# Patient Record
Sex: Female | Born: 1945 | Race: White | Hispanic: No | Marital: Married | Smoking: Former smoker
Health system: Southern US, Community
[De-identification: ages and names within clinical notes are randomized; demographics above are authoritative.]

## PROBLEM LIST (undated history)

## (undated) DIAGNOSIS — E785 Hyperlipidemia, unspecified: Secondary | ICD-10-CM

## (undated) DIAGNOSIS — F419 Anxiety disorder, unspecified: Secondary | ICD-10-CM

## (undated) DIAGNOSIS — E119 Type 2 diabetes mellitus without complications: Secondary | ICD-10-CM

## (undated) DIAGNOSIS — J449 Chronic obstructive pulmonary disease, unspecified: Secondary | ICD-10-CM

## (undated) DIAGNOSIS — I10 Essential (primary) hypertension: Secondary | ICD-10-CM

## (undated) DIAGNOSIS — I639 Cerebral infarction, unspecified: Secondary | ICD-10-CM

## (undated) DIAGNOSIS — K649 Unspecified hemorrhoids: Secondary | ICD-10-CM

## (undated) HISTORY — DX: Unspecified hemorrhoids: K64.9

## (undated) HISTORY — DX: Cerebral infarction, unspecified: I63.9

## (undated) HISTORY — DX: Chronic obstructive pulmonary disease, unspecified: J44.9

## (undated) HISTORY — DX: Type 2 diabetes mellitus without complications: E11.9

## (undated) HISTORY — PX: CATARACT EXTRACTION: SUR2

---

## 2002-03-30 HISTORY — PX: COLONOSCOPY: SHX174

## 2006-03-30 DIAGNOSIS — I639 Cerebral infarction, unspecified: Secondary | ICD-10-CM

## 2006-03-30 HISTORY — DX: Cerebral infarction, unspecified: I63.9

## 2006-10-13 ENCOUNTER — Emergency Department (HOSPITAL_COMMUNITY): Admission: EM | Admit: 2006-10-13 | Discharge: 2006-10-13 | Payer: Self-pay | Admitting: Emergency Medicine

## 2006-10-15 ENCOUNTER — Ambulatory Visit: Payer: Self-pay | Admitting: Internal Medicine

## 2006-10-15 ENCOUNTER — Inpatient Hospital Stay (HOSPITAL_COMMUNITY): Admission: EM | Admit: 2006-10-15 | Discharge: 2006-10-23 | Payer: Self-pay | Admitting: Emergency Medicine

## 2006-10-18 ENCOUNTER — Encounter (INDEPENDENT_AMBULATORY_CARE_PROVIDER_SITE_OTHER): Payer: Self-pay | Admitting: Emergency Medicine

## 2006-10-18 ENCOUNTER — Ambulatory Visit: Payer: Self-pay | Admitting: Physical Medicine & Rehabilitation

## 2006-10-18 ENCOUNTER — Ambulatory Visit: Payer: Self-pay | Admitting: *Deleted

## 2006-10-20 ENCOUNTER — Encounter (INDEPENDENT_AMBULATORY_CARE_PROVIDER_SITE_OTHER): Payer: Self-pay | Admitting: Emergency Medicine

## 2006-10-22 ENCOUNTER — Encounter (INDEPENDENT_AMBULATORY_CARE_PROVIDER_SITE_OTHER): Payer: Self-pay | Admitting: Internal Medicine

## 2006-10-23 ENCOUNTER — Ambulatory Visit: Payer: Self-pay | Admitting: Physical Medicine & Rehabilitation

## 2006-10-23 ENCOUNTER — Inpatient Hospital Stay (HOSPITAL_COMMUNITY)
Admission: RE | Admit: 2006-10-23 | Discharge: 2006-11-11 | Payer: Self-pay | Admitting: Physical Medicine & Rehabilitation

## 2006-11-16 ENCOUNTER — Encounter: Payer: Self-pay | Admitting: Physical Medicine & Rehabilitation

## 2006-11-29 ENCOUNTER — Encounter: Payer: Self-pay | Admitting: Physical Medicine & Rehabilitation

## 2006-12-29 ENCOUNTER — Encounter: Payer: Self-pay | Admitting: Physical Medicine & Rehabilitation

## 2006-12-31 ENCOUNTER — Ambulatory Visit: Payer: Self-pay | Admitting: Physical Medicine & Rehabilitation

## 2006-12-31 ENCOUNTER — Encounter
Admission: RE | Admit: 2006-12-31 | Discharge: 2007-01-03 | Payer: Self-pay | Admitting: Physical Medicine & Rehabilitation

## 2007-01-29 ENCOUNTER — Encounter: Payer: Self-pay | Admitting: Physical Medicine & Rehabilitation

## 2007-02-28 ENCOUNTER — Encounter: Payer: Self-pay | Admitting: Physical Medicine & Rehabilitation

## 2007-03-18 ENCOUNTER — Ambulatory Visit: Payer: Self-pay | Admitting: Physical Medicine & Rehabilitation

## 2007-03-18 ENCOUNTER — Encounter
Admission: RE | Admit: 2007-03-18 | Discharge: 2007-03-21 | Payer: Self-pay | Admitting: Physical Medicine & Rehabilitation

## 2007-03-31 ENCOUNTER — Encounter: Payer: Self-pay | Admitting: Physical Medicine & Rehabilitation

## 2007-05-01 ENCOUNTER — Encounter: Payer: Self-pay | Admitting: Physical Medicine & Rehabilitation

## 2007-07-12 ENCOUNTER — Encounter
Admission: RE | Admit: 2007-07-12 | Discharge: 2007-07-15 | Payer: Self-pay | Admitting: Physical Medicine & Rehabilitation

## 2007-07-15 ENCOUNTER — Ambulatory Visit: Payer: Self-pay | Admitting: Physical Medicine & Rehabilitation

## 2007-10-25 ENCOUNTER — Ambulatory Visit: Payer: Self-pay | Admitting: Unknown Physician Specialty

## 2007-11-02 ENCOUNTER — Encounter
Admission: RE | Admit: 2007-11-02 | Discharge: 2007-11-04 | Payer: Self-pay | Admitting: Physical Medicine & Rehabilitation

## 2007-11-04 ENCOUNTER — Ambulatory Visit: Payer: Self-pay | Admitting: Physical Medicine & Rehabilitation

## 2009-05-01 ENCOUNTER — Encounter: Payer: Self-pay | Admitting: Family Medicine

## 2009-05-08 ENCOUNTER — Ambulatory Visit: Payer: Self-pay | Admitting: Family Medicine

## 2009-05-28 ENCOUNTER — Encounter: Payer: Self-pay | Admitting: Family Medicine

## 2010-05-12 ENCOUNTER — Ambulatory Visit: Payer: Self-pay | Admitting: Ophthalmology

## 2010-05-26 ENCOUNTER — Ambulatory Visit: Payer: Self-pay | Admitting: Ophthalmology

## 2010-06-13 ENCOUNTER — Ambulatory Visit: Payer: Self-pay | Admitting: Ophthalmology

## 2010-06-16 ENCOUNTER — Ambulatory Visit: Payer: Self-pay | Admitting: Ophthalmology

## 2010-08-12 NOTE — Assessment & Plan Note (Signed)
Molly [name] returns to the clinic today for followup evaluation  accompanied by her husband.  The patient is a 65 year old Caucasian  female with a history of hypertension and noninsulin dependent diabetes  mellitus.  She was admitted October 15, 2006, with decreased balance and  vertigo.  MRI study showed a right posterior cerebral artery infarction.  MRA study was negative for significant intracranial stenosis.  Carotid  Dopplers showed right internal carotid artery stenosis of 40 to 60%.  Transesophageal echocardiogram showed an ejection fraction of 65% with a  small patent foramen ovale.  Neurology was consulted and recommended  Aggrenox therapy.  The patient was moved to the rehabilitation unit October 23, 2006, remained there through discharge November 11, 2006.   Since discharge the patient has followed up with Dr. Thana Ates with no  changes made.  She has also followed up with Dr. Pearlean Brownie and has a bubble  study planned for the next week or two to evaluate the patent foramen  ovale.  She continues to receive outpatient therapies at [facility] including physical and occupational therapy two times per week.  She has lost approximately 25 pounds and reports that her blood sugar  has been in the 70 to 100 range.  She does need refills on numerous  medicines in the office today.   In terms of her ambulation, she is walking with a single point cane and  occasionally without any assistive device whatsoever.  She does need  some help with bathing and dressing secondary to persistent weakness of  her left upper extremity.   MEDICATIONS:  1. Aggrenox one capsule b.i.d.  2. Gemfibrozil 600 mg b.i.d.  3. Avandia 8 mg daily.  4. Metformin 500 mg one tablet daily.  5. Avapro 150 mg daily.  6. Ocuvite one tablet daily.  7. Glyburide 5 mg b.i.d.   REVIEW OF SYSTEMS:  Noncontributory.   PHYSICAL EXAM:  A well-appearing, slightly to moderately overweight  adult female seated in a manual  wheelchair.  Blood pressure is 100/49  with a pulse of 90, respiratory rate 18 and O2 saturation 93% on room  air.  Right upper extremity and right lower extremity strength were 5/5  throughout.  Left upper extremity strength showed shoulder abduction and  flexion to 3-/5 with elbow flexion at 3-/5 and grip strength at 2+/5.  Left lower extremity hip flexion was 3-/5 with knee extension of 4+/5.  Ankle dorsiflexion was 2/5.   IMPRESSION:  1. 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  2. Noninsulin dependent diabetes mellitus.  3. Hypertension.  4. Dyslipidemia.   In the office today we did refill the patient's gemfibrozil along with  her Ocuvite, Glucophage, Avapro, Aggrenox, glyburide and Avandia  medications.  She will continue on outpatient therapies at this point.  We will plan on seeing her in followup in approximately 3 months' time.  She still desires to retain full use of her left upper extremity,  including finger motion, such that she could return to playing an  instrument such as the piano.  She has minimal movement in her left  distal upper extremity at the present time but still is hopeful for full  recovery.  The left lower  extremity has progressed well at this point and I expect that will  continue to do well for her.  Will plan on seeing her in followup as  noted above.  ______________________________  Ellwood Dense, M.D.     DC/MedQ  D:  01/03/2007 12:19:41  T:  01/03/2007 15:26:11  Job #:  [id number]

## 2010-08-12 NOTE — Discharge Summary (Signed)
NAME:  Molly [name], Molly [name] NO.:  [id number]   MEDICAL RECORD NO.:  [id number]          PATIENT TYPE:  INP   LOCATION:  3737                         FACILITY:  [facility]   PHYSICIAN:  Lonia Blood, M.D.       DATE OF BIRTH:  15-Mar-1946   DATE OF ADMISSION:  10/15/2006  DATE OF DISCHARGE:                               DISCHARGE SUMMARY   DISCHARGE DIAGNOSES:  1. 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  2. Left upper extremity hemiparesis related to the above stroke.  3. Hypertension.  4. Uncontrolled diabetes mellitus, type 2.  5. Morbid obesity.  6. Hypertriglyceridemia.  7. Status post tonsillectomy.   MEDICATIONS:  Medications at disposition will be dictated at the time of  the actual discharge of the patient.   PROCEDURES DURING THIS ADMISSION:  1. On October 15, 2006, the patient underwent MRI of the brain, findings      of acute stroke in the right medial temporal lobe and basal      ganglia, chronic small-vessel changes in the brainstem throughout      the hemispheric white matter, negative intracranial MR angiography.  2. On October 18, 2006, carotid ultrasound, findings of 40% IC stenosis      per flow.  3. MRA of the neck, no carotid stenosis.  4. On October 18, 2006, repeat MRI of the brain; findings of ongoing      acute infarct in the right medial temporal lobe. No other infarcts      seen.   CONSULTATIONS DURING THIS ADMISSION:  The patient was seen in  consultation by Dr. Pearlean Brownie from neurology, as well as Dr. Riley Kill from  physical medicine/rehabilitation.   HISTORY AND PHYSICAL:  For history and physical from admission, please  refer to the H and P done by Dr. Lavera Guise on October 15, 2006.   HOSPITAL COURSE:  1. Stroke. The patient was admitted with an acute right temporal lobe      and basal ganglia stroke. Her initial deficits were minimal. The      patient had only ataxia and visual field deficit. Unfortunately, on      a day-by-day basis  the patient's neurological deficits worsened. At      the time of my dictation, the patient has a significant right upper      extremity hemiparesis and visual cut deficit. She also has      worsening ataxia. All these deficits are fully explained by the      natural evolution of the patient's  initial stroke. Repeat studies      of the brain confirmed this supposition. The patient was treated      with aspirin and Plavix and a neurological consultation was      obtained. Dr. Pearlean Brownie from neurology suggested intravenous heparin      and a transesophageal echocardiogram per cardiology. The procedure      is planned for October 20, 2006.   1. Uncontrolled diabetes mellitus, type 2. The patient's measured      hemoglobin A1c was 8.9. The patient was  titrated up on metformin      and started on Lantus. She also had a sliding-scale insulin added.      We have achieved better control for the patient's  diabetes by      adjusting the Lantus on a daily basis. Extensive education has been      provided to the patient.   1. Hyperlipidemia. The patient's total cholesterol is 152 and the      triglyceride level is 456. The patient's  hypertriglyceridemia is      definitely related to her uncontrolled diabetes, obesity and      metabolic syndrome. The patient was started on gemfibrozil and she      will have her diabetes controlled.      Lonia Blood, M.D.  Electronically Signed     SL/MEDQ  D:  10/19/2006  T:  10/19/2006  Job:  [id number]

## 2010-08-12 NOTE — Consult Note (Signed)
NAME:  Molly [name], Molly [name]               ACCOUNT NO.:  [id number]   MEDICAL RECORD NO.:  [id number]          PATIENT TYPE:  INP   LOCATION:  3737                         FACILITY:  [facility]   PHYSICIAN:  Pramod P. Pearlean Brownie, MD    DATE OF BIRTH:  Sep 10, 1945   DATE OF CONSULTATION:  10/18/2006  DATE OF DISCHARGE:                                 CONSULTATION   REFERRING PHYSICIAN:  Lonia Blood, M.D.   REASON FOR REFERRAL:  Stroke.   HISTORY OF PRESENT ILLNESS:  Molly [name] is a 65 year old Caucasian lady  who was admitted 3 years ago with symptoms of dizziness and has gotten  worse and today developed left-sided weakness.  The patient actually  states the symptoms began 5 days ago when she has sudden onset episode  in which she passed out and fell down. She did state she did not lose  consciousness.  She was able to get up, and she rested for a little  while and felt all right.  The next day she actually went back to work  and was okay, but since Friday she has developed some vision  difficulties, trouble seeing on the left side, double vision, dizziness,  lack of balance.  She was admitted for evaluation on July 18 but this  morning was noted as having left-sided weakness.  The patient denies any  headache.  There is no prior known history of stroke, TIA, seizures or  significant neurological problems.   PAST MEDICAL HISTORY:  Significant for:  1. Diabetes which is very poorly controlled for the last 5 years.  2. Hypertension.  3. Obesity.   MEDICATIONS:  Aspirin. Plavix has been added during this admission.  Lovenox, Lopid, insulin, metformin, Avapro.   ALLERGIES TO MEDICATIONS:  None known.   FAMILY HISTORY:  None with stroke.   SOCIAL HISTORY:  The patient works as a Research scientist (medical) at [employer]. She does not smoke, drink.  She is married, lives with her  husband.   REVIEW OF SYSTEMS:  Negative for fever, chest pain, shortness of breath,  cough, diarrhea, nausea,  vomiting.   PHYSICAL EXAMINATION:  GENERAL:  Reveals obese, middle-age, Caucasian  lady who is not in distress.  VITAL SIGNS:  She is afebrile, temperature 98.7, pulse rate 102 per  minute and regular sinus, respiratory rate 18 per minute, blood pressure  132/84, saturation 97% on room air.  HEENT:  Head is nontraumatic.  NECK:  Supple without bruit.  CARDIAC:  Regular heart sounds.  LUNGS:  Clear to auscultation.  NEUROLOGIC:  The patient is awake, alert, oriented to time, place and  person.  There is no aphasia.  She has mild dysarthria.  Eye movements  are full range, but there is saccadic dysmetria to the left.  There is  mild left gaze paresis.  She has dense left homonymous hemianopsia.  There is no facial asymmetry.  Palatal movements normal.  Tongue is  midline.  Motor system exam reveals left hemiparesis with 2/5 strength  proximally at the shoulder and elbow.  She has good grip, has weakness  of  fine finger movements on the left.  Left lower extremity strength is  3/5 proximally.  There is a left foot drop with 0/5 strength in the  ankle dorsiflexors.  There is no subjective sensory loss in the left.  There is no sensory inattention to the left.  Coordination is impaired  on the left.   DATA REVIEWED:  MRI scan of the brain shows acute infarct involving the  medial aspect of the right temporal lobe extending up to the lateral  part of the thalamus.  Does not seem to involve occipital lobe.   MRA of the brain shows no significant occlusion of the large to medium-  size intracranial vessels.   Carotid ultrasound shows antegrade flow in both vertebral arteries and  no significant carotid stenosis.   Hemoglobin A1c is elevated at 8.9.  Homocysteine is normal at 7.1.  Triglycerides are significantly elevated at 456. HDL is low at 26, total  cholesterol of 152.   IMPRESSION:  A 65-year lady with right posterior cerebral artery  territory infarct with slight recent  worsening. I suspect additional  brain stem infarct as well.  The etiology is likely arterial thrombosis,  though proximal vertebral artery source or cardiac source still need to  be ruled out.   PLAN:  1. I would recommend further evaluation with repeating limited      diffusion-weighted MRI of the brain and taking addition sagittal      and coronal sections as well.  2. Check MRA of the neck with and without contrast to rule out high-      grade possible stenosis.  3. IV heparin drip until the patient stabilizes is above stroke workup      is completed.  4. Would consider switching the aspirin and Plavix to Aggrenox later.  5. Physical therapy consult, rehab consults.   I had long discussion with the patient and answered questions.  Thank  you for the referral.  I will follow the patient.           ______________________________  Sunny Schlein. Pearlean Brownie, MD     PPS/MEDQ  D:  10/18/2006  T:  10/18/2006  Job:  [id number]   cc:   Lonia Blood, M.D.

## 2010-08-12 NOTE — H&P (Signed)
NAME:  DESHARA, ROSSI NO.:  [id number]   MEDICAL RECORD NO.:  [id number]          PATIENT TYPE:  INP   LOCATION:  3733                         FACILITY:  [facility]   PHYSICIAN:  Lonia Blood, M.D.       DATE OF BIRTH:  [birth year]   DATE OF ADMISSION:  10/15/2006  DATE OF DISCHARGE:                              HISTORY & PHYSICAL   The patient's primary care physician is Dr. Charlette Caffey  in [place],  [place], which makes the patient unassigned for [facility].   CHIEF COMPLAINT:  Balance problems.   HISTORY OF PRESENT ILLNESS:  Ms. Holstrom is a 65 year old woman with  diabetes mellitus, hypertension, and a family history of strokes, who  relates an episode on October 13, 2006, when being at work all of a sudden  tried to get up, fell to the ground and passed out.  She was taken to  the emergency room and evaluated fully and felt to be safe for  discharge.  The patient went home but continued to feel a little bit  uneasy with episodes of vertigo all times with double vision at times  and, indeed, her balance remained kind of poor to a point where she got  up today on October 15, 2006, and fell to the ground again.  At that time  she decided it might be time to come back to the emergency room to get  evaluated again.  The patient denies any of gross weakness.  She denies  any numbness.  She denies any speech problems.  Her husband reports that  the patient's mental status seems to be at baseline.   PAST MEDICAL HISTORY:  1. Diabetes mellitus, diagnosed 5 years ago.  2. Hypertension diagnosed 5 years ago.  3. Obesity.  4. Tonsillectomy at age 64.   HOME MEDICATIONS:  1. Aspirin 81 mg daily, the patient takes it every other day.  2. Diovan, unknown dose daily.  3. Glucovance, unknown dose daily.   SOCIAL HISTORY:  The patient does not drink alcohol, does not smoke  cigarettes, does not use any drugs.  She works as a Research scientist (medical) at  [employer].  She is married and does not have any children.   FAMILY HISTORY:  The patient's mother is alive, age 73, healthy.  The  patient father is alive, age 43, with a stroke at age 15.  The patient  has a brother that is healthy and two sisters that are healthy.   REVIEW OF SYSTEMS:  As per HPI.  The patient denies currently any  headaches.  Denies chest pain.  Denies shortness of breath.  Denies  nausea, vomiting or abdominal pain.  She reports occasional  constipation.  Denies any dysuria.  Denies any skin changes.  Denies any  depression or suicidal or homicidal ideations.   PHYSICAL EXAMINATION AT TIME OF ADMISSION:  Temperature 97.7, blood  pressure 131/71, pulse 96, respirations 16, saturation IS 95% room air.  Orthostatics present a blood pressure lying of 137/75 with a blood  pressure standing of 157/63, heart  rate is 96 lying and standing.  The patient peers alert, oriented to place, person and time.  Her speech  is intact.  Memory is intact.  Repetition is intact.  Language is  intact.  Her eyes have pupils equal, equal and round, react to light,  extraocular movements intact.  There is the presence of nystagmus when  the patient gazes to the right upper temporal quadrant.  There is a cut  in the visual field on the left only in the right eye in a left field  but the left temporal field on the left eye is intact.  Extraocular  movements intact.  Throat is clear.  NECK:  Supple.  No JVD.  No carotid bruits.  CHEST:  Clear to auscultation without wheezes, rhonchi or crackles.  HEART:  Regular rhythm without murmurs, rubs or gallop.  ABDOMEN:  Obese and soft, nontender.  Bowel sounds are present.  No  palpable masses.  Lower extremities have no edema.  SKIN:  Warm and dry without any suspicious rashes.  NEUROLOGIC:  Strength is 5/5 in all four extremities.  Sensation intact  to all four extremities.  Deep tendon reflexes symmetric.  Plantars  downgoing.  I did not test the  gait as the patient is kind tired to the  stretcher with all the IVs and the telemetry leads.  Finger-to-nose  seems to be intact.   LABORATORY VALUES ON ADMISSION:  White blood cell count 10,000,  hemoglobin 14, platelet count 268.  Urinalysis with positive ketones but  negative protein, negative nitrite and leukocyte esterase.  Sodium is  137, potassium 4.4, chloride 102, bicarb 28, BUN 13, creatinine 0.7,  calcium 9.7, albumin 3.7.  A computed tomography scan of the head  mentions the possibility of a meningioma in the right frontal lobe  anteriorly.  MRI of the head shows a right mesial temporal lobe and  basal ganglia acute stroke.  MRA of the head does not reveal any  significant intracranial stenosis.   ASSESSMENT AND PLAN:  Acute right basal ganglia cerebrovascular accident  and right mesial temporal lobe cerebrovascular accident.  The patient's  recent fall, syncope and her symptoms are very much in sync with the  findings on the MRI of the brain.  I do suspect that the etiology of  this patient's stroke is a lacunar stroke related the patient's  uncontrolled diabetes and hypertension.  My plan is to admit the patient  to acute care units of [facility].  The patient will be kept on  telemetry.  She will have carotid ultrasounds, transthoracic  echocardiogram, a fasting lipid panel, homocystine level checked.  The  patient will have Plavix added to her aspirin.  Tight glucose control  will be instituted.  Diabetes teaching, hypertensive teaching, stroke  teaching will be provided.  For now I will use Lantus and sliding scale  insulin to bring the fasting plasma glucose under better control.  I  will hold the blood pressure medications for now and hydrate the patient  overnight.  Physical therapy and occupational therapy evaluation will be  provided.  DVT and GI prophylaxis will be done.      Lonia Blood, M.D.  Electronically Signed     SL/MEDQ  D:  10/15/2006   T:  10/16/2006  Job:  [id number]

## 2010-08-12 NOTE — Discharge Summary (Signed)
NAME:  Molly [name], Molly [name] NO.:  [id number]   MEDICAL RECORD NO.:  [id number]          PATIENT TYPE:  IPS   LOCATION:  4003                         FACILITY:  [facility]   PHYSICIAN:  Ellwood Dense, M.D.   DATE OF BIRTH:  1946-02-25   DATE OF ADMISSION:  10/23/2006  DATE OF DISCHARGE:  11/11/2006                               DISCHARGE SUMMARY   DISCHARGE DIAGNOSES:  1. Right cerebrovascular accident.  2. Subcutaneous Lovenox for deep vein thrombosis prophylaxis.  3. Diabetes mellitus.  4. Hyperlipidemia.  5. Hypertension.   HISTORY OF PRESENT ILLNESS:  This is a 65 year old female with history  of hypertension, non-insulin-dependent diabetes mellitus admitted July  18, with decreased balance and vertigo.  MRI with right PCA infarction.  MRA negative for stenosis.  Carotid Dopplers with right 40-60% internal  carotid artery stenosis.  TEE with ejection fraction of 65% and small  PFO.  Neurology followup and placed on Aggrenox therapy.  The patient  had been currently maintained on intravenous heparin for a short time.  X-rays of left ankle after recent fall were negative.  Venous Dopplers  of lower extremities negative.   PAST MEDICAL HISTORY:  See discharge diagnoses.  No alcohol or tobacco.   ALLERGIES:  No known drug allergies.   SOCIAL HISTORY:  Married.  Works as a Research scientist (medical) at [employer].  Husband retired who can assist on discharge.  They live in a  one level home with one step to entry.   MEDICATION PRIOR TO ADMISSION:  1. Aspirin 81 mg daily.  2. Diovan, questionable dose.  3. Glucovance 2.5/500 mg 2 tablets twice daily.  4. Avandia 8 mg daily.   REHABILITATION HOSPITAL COURSE:  The patient was admitted to inpatient  rehab services with therapies initiated on a 3-hour daily basis  consisting of physical therapy, occupational therapy, speech therapy and  rehabilitation nursing.  The following issues were addressed during the  patient's  rehabilitation stay.  Pertaining to Molly [name] right  cerebrovascular accident, maintained on Aggrenox therapy for recent  stroke.  Also, on subcutaneous Lovenox for deep vein thrombosis  prophylaxis.  She was overall minimal assist for mobility.  Occasional  moderate assist for dynamic gait.  Minimal assist for bathing lower  body.  She was continent of bowel and bladder.  Blood pressures  monitored on Avapro with diastolic pressures 64-70.  She would remain on  Lopid for hyperlipidemia.  During her rehabilitation stay, noted  elevated blood sugars 197 and 207.  She had a hemoglobin A1c of 8.9.  She was initially placed on Lantus insulin and adjusted.  She received  full diabetic teaching.  She continued to refuse her Lantus insulin and  felt that she could do well enough on oral agents, although it was  discussed at length her recent blood sugars.  She was placed back on  home regimens of glyburide which was increased to 5 mg twice daily,  Glucophage 500 mg 2 tablets twice daily and Avandia 8 mg daily.  Her  latest blood sugars were fair at 86, 117 and 81.  Although it was again  discussed at length the need to modify her diet at home, it was  questionable if she would be compliant with these requests with  admission hemoglobin A1c of 8.9.  Again, it was stressed she would need  to follow up with her primary M.D., Dr. Thana Ates, of [place], [place].   LABORATORY DATA AND X-RAY FINDINGS:  Latest labs show a sodium of 135,  potassium 4.4, BUN 22, creatinine 0.7.  White blood cell count of 7,  hemoglobin 14.1, hematocrit 42.3, platelets 319,000.   DISCHARGE MEDICATIONS:  1. Lopid 600 mg twice daily.  2. Avapro 150 mg daily.  3. Aggrenox 1 capsule twice daily.  4. Ocuvite 1 tablet daily.  5. Glyburide 5 mg twice daily.  6. Glucophage 1000 mg twice daily.  7. Avandia 8 mg daily.  8. Tylenol as needed.   ACTIVITY:  Activity as tolerated with supervision for safety.    DIET:  Carbohydrate modified diabetic diet.  Again, stressed the need to  maintain diet control.   FOLLOW UP:  She would follow up with Dr. Ellwood Dense at the  outpatient rehab service office.  As advised, Dr. Thana Ates, of  [place], [place].  Dr. Pearlean Brownie, neurology services, call for  appointment      Mariam Dollar, P.A.    ______________________________  Ellwood Dense, M.D.    DA/MEDQ  D:  11/10/2006  T:  11/11/2006  Job:  [id number]   cc:   Pramod P. Pearlean Brownie, MD  [facility]

## 2010-08-12 NOTE — Discharge Summary (Signed)
NAME:  Molly [name], VANROSSUM NO.:  [id number]   MEDICAL RECORD NO.:  [id number]          PATIENT TYPE:  INP   LOCATION:  3737                         FACILITY:  [facility]   PHYSICIAN:  Marcellus Scott, MD     DATE OF BIRTH:  28-Sep-1945   DATE OF ADMISSION:  10/15/2006  DATE OF DISCHARGE:                               DISCHARGE SUMMARY   ADDENDUM:  This is an addendum to the discharge summary that was done by  Dr. Lavera Guise on 10/19/06.   DISCHARGE DIAGNOSES:  1. 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 The acute infarct is in the right mesial temporal lobe      and periatrial-periventricular white matter and right basal      ganglion.  2. Uncontrolled diabetes.  3. Hypertension.  4. Hypertriglyceridemia.  5. Obesity.  6. Small patent foramen ovale on transesophageal echocardiogram.   DISCHARGE MEDICATIONS:  1. Senokot 1 tablet orally at bedtime.  2. Colace 100 mg orally daily.  3. Metformin 500 mg orally b.i.d.  4. Protonix 40 mg orally daily.  5. NovoLog resistant sliding-scale insulin with bedtime coverage.  6. NovoLog meal-time coverage 3 units t.i.d.  7. Lopid 600 mg orally b.i.d.  8. Avapro 150 mg orally daily.  9. Aggrenox 1 capsule orally daily until the 3rd of August 2008, and      then 1 capsule orally twice daily.  10.Lantus insulin 35 units subcutaneously at bedtime.  11.Tylenol 650 mg orally q. 4 h p.r.n.  12.Tylenol #3, 1 tablet orally q. 6 h p.r.n.  13.MiraLAX 15 grams orally at bedtime p.r.n.  14.Multivitamin 1 tablet orally daily.   PERTINENT LABORATORY AND X-RAY DATA:  On 10/20/06: Transthorasic  echocardiogram summary: Overall left ventricular systolic function was  normal. Left ventricular ejection fraction was estimated to be 65%.  There was mild aortic valvular regurgitation. There was systolic mitral  bowing of the anterior leaflet, but without diagnostic evidence for  mitral valve prolapse.   Transesophageal echocardiogram: final report is pending but suggestive  of small, patent foramen ovale with left to right shunting at rest,  weakly positive bubble study with Valsalva's.   X-ray of the left ankle which was done on 7/23, impression: Question  calcific tendinitis or prior injury of his Achilles tendon. Calcaneal  spur formation. No definitive acute bone abnormality.   Pertinent lab results: CBC: Hemoglobin 12.9, hematocrit 38.7, white  blood cells 7.4, platelets 281. Basic metabolic panel: Remarkable for a  glucose of 164, BUN 13, creatinine of 0.57, a homocysteine of 7.1. Lipid  pane with cholesterol of 152, triglycerides 456, HDL 26, LDL and VLDL  unable to calculate. Hemoglobin A1c of 8.9.   CONSULTATIONS:  1. Neurology, Dr. Pearlean Brownie.  2. Inpatient rehabilitation.   HOSPITAL COURSE:  Since 10/19/06, the patient has continued with symptoms  of difficulty seeing on her left visual fields and left-sided limb  weakness. These are residual effects her current stroke. She says there  has not been any significant improvement of either of these, but there  has not been any further deterioration.  She has left homonymous  hemianopsia and left hemiparesis with upper limb strength 1-2/5 and  lower extremity 3/5. The patient was continued on a heparin drip. She  had the transesophageal echocardiogram with findings as above. Neurology  subsequently followed her up and have indicated that she does not need  Coumadin or heparin at this time.  Heparin was stopped 2 days ago. She  is continued on Aggrenox.   The patient is to follow up with Dr. Pearlean Brownie as an outpatient regarding  continued medical management of her patent foramen ovale versus  enrollment in a RESPECT trial for closure of the PFO. The patient will  also get her transcranial Doppler bubble study as an outpatient.  Neurology have cleared her for discharge to an inpatient rehabilitation.   The patient was complaining of  some left ankle pain at one point. X-rays  of her left ankle were done and a fracture was ruled out, also she had  venous Dopplers of both lower extremities and DVT was ruled out. For her  uncontrolled diabetes her insulins have been titrated, but they are  still uncontrolled. Her sliding scale insulin will be changed to a  resistant scale. To continue titrating her medications in rehab for  better diabetes control.   The patient at this time is stable to be discharged to an inpatient  rehab facility.      Marcellus Scott, MD  Electronically Signed     AH/MEDQ  D:  10/23/2006  T:  10/23/2006  Job:  [id number]   cc:   Dennison Mascot  Pramod P. Pearlean Brownie, MD

## 2010-08-12 NOTE — Assessment & Plan Note (Signed)
Molly [name] returns to clinic today for followup evaluation.  She is  accompanied by her husband.  She has obtained a new primary care  physician Dr. Ronn Melena.  They have adjusted her medicines including  stopping her Aggrenox and starting Plavix.  She has also been taken off  Avandia and was changed from Avapro to a beta blocker.  She still  reports that her blood sugars are approximately 200.  She is planning to  have repeat blood studies to look at her lipid levels over the next  couple of weeks.  She has applied for disability and is waiting a  decision.   The patient has finished all therapies at this point as she has build up  an large bill of approximately $10,000.  Apparently, her insurance was  not paying for several of her last visits.  She also has a $50 dollar  Copay that they cannot afford with each therapy.  She is asking about  the therapy that she can do on her own at home and we have reviewed that  in the office today.   MEDICATIONS:  1. Plavix 75 mg.  2. Gemfibrozil 600 mg b.i.d.  3. Metformin 500 mg.  4. Beta blocker daily.  5. Ocuvite 1 tablet.  6. Glyburide 5 mg b.i.d.   REVIEW OF SYSTEMS:  Positive for high blood sugar and constipation.   PHYSICAL EXAMINATION:  GENERAL:  Moderately overweight adult female in  mild-to-no acute discomfort.  VITAL SIGNS:  Blood pressure 100/62 with a pulse of 82, respiratory rate  20, and O2 saturation 94% on room air.  EXTREMITIES:  She has 5/5 strength in the right upper and right lower  extremity.  Left upper extremity strength was generally 3+/5 with  decreased coordinated movement of her left hand with strength of  approximately 2/5.  Left lower extremity strength was 4+/5.  She  ambulates with a single point cane.   IMPRESSION:  1. Status post right posterior cerebral artery infarct with left      hemiparesis, arm greater than leg.  2. Non-insulin-dependent diabetes mellitus.  3. Hypertension.  4. Dyslipidemia.   In  the office today, we did review her risk factors for a repeat stroke.  At this point, I have reviewed with her some therapy that she can do at  home for the left upper extremity in terms of grip and release, along  with fine motor coordination.  I will plan on seeing the patient in  follow up on an as needed basis.  She will continue with her primary  care physician at this point.           ______________________________  Ellwood Dense, M.D.     DC/MedQ  D:  11/04/2007 11:31:22  T:  11/05/2007 03:33:45  Job #:  [id number]

## 2010-08-12 NOTE — Assessment & Plan Note (Signed)
Molly [name] returns to the clinic today accompanied by her husband.  Overall, she is doing well. She continues to attend outpatient  occupational therapy at [facility] for her left upper extremity.  She is going 2-3 times per week. She ambulates with an without a single  point cane at the present time. She reports that she still has some  visual field deficits on the left side. She reports that her blood  sugars have been in the 120 to 130 range on a combination of Avandia,  metformin and glyburide. She does need refills on each of her  medications in the office today. She still has not set up a new primary  care physician as the P.A. that she was seeing in one doctors office  apparently left. She has not made any changes as yet, but does not wish  to continue to see Dr. Thana Ates who was set up for her when the P.A.  left.   CURRENT MEDICATIONS:  1. Aggrenox one capsule b.i.d.  2. Gemfibrozil 600 mg b.i.d.  3. Avandia 8 mg daily.  4. Metformin 500 mg daily.  5. Avapro 150 mg daily.  6. Ocuvite one tablet daily.  7. Glyburide 5 mg b.i.d.   REVIEW OF SYSTEMS:  Positive for constipation.   PHYSICAL EXAMINATION:  Reasonably well-appearing moderately overweight  adult female in no acute discomfort. Blood pressure 133/80. Pulse 112.  Respiratory rate is 18 and O2 saturation is 96% on room air.  Right upper and right lower extremity strengths were 5/5. Left upper  extremity strength was 3+/5 at the shoulder and 3-/5 at the elbow with  2/5 movement at the left hand. Left lower extremity strength was  generally 3+/5.   IMPRESSION:  1. Status post right posterior cerebral artery infarction with left      hemiparesis, arm greater than left.  2. Non-insulin dependent diabetes mellitus.  3. Hypertension.  4. Dyslipidemia.   In the office today we did refill the patient's glyburide along with her  avapro, metformin, Avandia, gemfibrozil and Aggrenox. I have encouraged  her to try to  call around to see whose office would be using P.A.'s as  she is very comfortable with P.A.'s at this time with her last  experience having been good. I will plan on seeing the patient in  followup in this office in approximately 3-4 months time with refills as  necessary prior to that appointment until she sets up a new primary care  physician.           ______________________________  Ellwood Dense, M.D.     DC/MedQ  D:  03/21/2007 15:51:10  T:  03/21/2007 22:20:28  Job #:  [id number]

## 2010-08-12 NOTE — Assessment & Plan Note (Signed)
Molly [name] returns to clinic today for followup evaluation.  She is  doing well overall.  She is walking with a single-point cane with a slow  gait but she reports no problems with falls.  She does request a restart  of outpatient occupational therapy at [facility].   In terms of her primary care, she is not seeing her prior primary care  as they were unresponsive to her needs.  She is in the process of  looking for a new primary care physician but needs refills on her  medicines through this office.   MEDICATIONS:  1. Aggrenox 1 b.i.d.  2. Gemfibrozil 600 mg b.i.d.  3. Avandia 8 mg daily.  4. Metformin 500 mg daily.  5. Avapro 150 mg daily.  6. Ocuvite 1 tablet daily.  7. Glyburide 5 mg b.i.d.   REVIEW OF SYSTEMS:  Positive for constipation.   PHYSICAL EXAMINATION:  Well-appearing moderately overweight adult female  in mild to no acute discomfort.  Vitals were not obtained in the office  today.  She has 5/5 strength in the right upper and right lower  extremity.  Left upper extremity strength was probably 3+/5, left lower  extremity strength was 4+/5.   IMPRESSION:  1. Status post right posterior cerebral artery infarct with left      hemiparesis, arm greater than leg.  2. Noninsulin-dependent diabetes mellitus.  3. Hypertension.  4. Dyslipidemia.   In the office today we did briefly hold the patient's Aggrenox,  gemfibrozil, Avandia, metformin, Avapro, and Glyburide.  I have told her  that she does not need to take any other blood thinners than the  Aggrenox and certainly no extra aspirin.  She also needs to take Advil  or ibuprofen only as directed with meals to avoid stomach irritation.   In terms of paperwork, we have been supplied with an attending physician  statement for her [facility] and I will fill that paper out  and get it back to her contact person.   Will plan on seeing the patient in followup in approximately 4 months'  time.  In the  meantime, will continue to prescribe her primary care  medicines as absolutely necessary.           ______________________________  Ellwood Dense, M.D.     DC/MedQ  D:  07/15/2007 10:50:04  T:  07/15/2007 11:19:00  Job #:  [id number]

## 2010-10-23 ENCOUNTER — Ambulatory Visit: Payer: Self-pay | Admitting: Unknown Physician Specialty

## 2011-01-12 LAB — CBC
HCT: 38.2
HCT: 39.8
HCT: 41.6
HCT: 43.1
MCHC: 33
MCHC: 33.1
MCHC: 33.3
MCHC: 33.5
MCV: 88
MCV: 89.1
MCV: 89.2
MCV: 89.7
Platelets: 264
Platelets: 266
Platelets: 268
Platelets: 270
Platelets: 277
Platelets: 281
Platelets: 319
RBC: 4.33
RBC: 4.71
RDW: 13.4
RDW: 13.7
RDW: 13.7
RDW: 13.7
WBC: 10
WBC: 7
WBC: 7.4
WBC: 8.5

## 2011-01-12 LAB — BASIC METABOLIC PANEL
BUN: 13
BUN: 14
CO2: 27
Chloride: 102
Chloride: 102
Creatinine, Ser: 0.57
Creatinine, Ser: 0.61
GFR calc non Af Amer: >60
Glucose, Bld: 180 — ABNORMAL HIGH
Potassium: 3.7
Potassium: 4

## 2011-01-12 LAB — COMPREHENSIVE METABOLIC PANEL
ALT: 62 — ABNORMAL HIGH
AST: 24
AST: 28
Albumin: 3.6
Albumin: 3.7
Alkaline Phosphatase: 59
Alkaline Phosphatase: 64
BUN: 13
CO2: 28
Chloride: 102
Chloride: 99
Creatinine, Ser: 0.77
Creatinine, Ser: 0.78
GFR calc Af Amer: >60
GFR calc Af Amer: >60
GFR calc non Af Amer: >60
Potassium: 4.4
Potassium: 4.4
Total Bilirubin: 0.8
Total Bilirubin: 0.9
Total Protein: 6.9

## 2011-01-12 LAB — I-STAT 8, (EC8 V) (CONVERTED LAB)
BUN: 18
Chloride: 101
Glucose, Bld: 202 — ABNORMAL HIGH
Potassium: 4
pCO2, Ven: 59.1 — ABNORMAL HIGH
pH, Ven: 7.289

## 2011-01-12 LAB — RAPID URINE DRUG SCREEN, HOSP PERFORMED
Amphetamines: NOT DETECTED
Benzodiazepines: NOT DETECTED
Cocaine: NOT DETECTED
Opiates: NOT DETECTED
Tetrahydrocannabinol: NOT DETECTED

## 2011-01-12 LAB — URINALYSIS, ROUTINE W REFLEX MICROSCOPIC
Bilirubin Urine: NEGATIVE
Hgb urine dipstick: NEGATIVE
Ketones, ur: 15 — AB
Specific Gravity, Urine: 1.009
pH: 6

## 2011-01-12 LAB — LIPID PANEL
Cholesterol: 152
HDL: 26 — ABNORMAL LOW
LDL Cholesterol: UNDETERMINED
Triglycerides: 456 — ABNORMAL HIGH

## 2011-01-12 LAB — HOMOCYSTEINE: Homocysteine: 7.1

## 2011-01-12 LAB — POCT CARDIAC MARKERS
CKMB, poc: 1.4
Myoglobin, poc: 46.3
Operator id: 282201
Troponin i, poc: <0.05

## 2011-01-12 LAB — DIFFERENTIAL
Basophils Absolute: 0
Basophils Absolute: 0
Basophils Relative: 0
Eosinophils Absolute: 0.2
Eosinophils Relative: 1
Eosinophils Relative: 3
Lymphocytes Relative: 14
Lymphocytes Relative: 30
Monocytes Absolute: 0.7
Monocytes Absolute: 0.8 — ABNORMAL HIGH
Monocytes Relative: 8
Neutro Abs: 7.7

## 2011-01-12 LAB — CK TOTAL AND CKMB (NOT AT ARMC): Relative Index: INVALID

## 2011-01-12 LAB — HEPARIN LEVEL (UNFRACTIONATED)
Heparin Unfractionated: 0.35
Heparin Unfractionated: 0.37

## 2011-01-12 LAB — TROPONIN I: Troponin I: 0.01

## 2011-01-12 LAB — APTT: aPTT: 26

## 2013-01-04 ENCOUNTER — Ambulatory Visit: Payer: Self-pay | Admitting: Podiatry

## 2013-05-09 ENCOUNTER — Ambulatory Visit: Payer: Self-pay | Admitting: General Surgery

## 2013-05-16 ENCOUNTER — Encounter: Payer: Self-pay | Admitting: *Deleted

## 2013-05-31 ENCOUNTER — Encounter: Payer: Self-pay | Admitting: General Surgery

## 2013-06-20 ENCOUNTER — Ambulatory Visit: Payer: Self-pay | Admitting: General Surgery

## 2013-07-13 ENCOUNTER — Ambulatory Visit: Payer: Self-pay | Admitting: Podiatrist

## 2013-07-13 ENCOUNTER — Telehealth: Payer: Self-pay | Admitting: *Deleted

## 2013-07-13 NOTE — Telephone Encounter (Signed)
Notified them that the pt was a no show for appt

## 2013-07-17 ENCOUNTER — Encounter: Payer: Self-pay | Admitting: Podiatry

## 2013-07-17 ENCOUNTER — Ambulatory Visit (INDEPENDENT_AMBULATORY_CARE_PROVIDER_SITE_OTHER): Payer: Medicare HMO | Admitting: Podiatry

## 2013-07-17 ENCOUNTER — Ambulatory Visit (INDEPENDENT_AMBULATORY_CARE_PROVIDER_SITE_OTHER): Payer: Medicare HMO

## 2013-07-17 VITALS — BP 125/78 | HR 98 | Resp 16

## 2013-07-17 DIAGNOSIS — M79673 Pain in unspecified foot: Secondary | ICD-10-CM

## 2013-07-17 DIAGNOSIS — M79609 Pain in unspecified limb: Secondary | ICD-10-CM

## 2013-07-17 DIAGNOSIS — E1149 Type 2 diabetes mellitus with other diabetic neurological complication: Secondary | ICD-10-CM

## 2013-07-17 DIAGNOSIS — E11621 Type 2 diabetes mellitus with foot ulcer: Secondary | ICD-10-CM

## 2013-07-17 DIAGNOSIS — E1169 Type 2 diabetes mellitus with other specified complication: Secondary | ICD-10-CM

## 2013-07-17 DIAGNOSIS — L97509 Non-pressure chronic ulcer of other part of unspecified foot with unspecified severity: Secondary | ICD-10-CM

## 2013-07-17 DIAGNOSIS — M2040 Other hammer toe(s) (acquired), unspecified foot: Secondary | ICD-10-CM

## 2013-07-17 NOTE — Progress Notes (Signed)
She presents today as an inpatient chief complaint of painful elongated toenails a possible ulceration distal aspect of the hallux left. She states that this opens up on occasions. This is occurring because of the way the toe is shaped and this was her stroke side. States that her hemoglobin A1c is approximately a 9.  Objective: Vital signs are stable she is alert and oriented x3 I have reviewed her past medical history medications allergies surgeries social history. She ambulates with a cane. Pulses are palpable bilateral neurologic sensorium is decreased per since once the monofilament deep tendon reflexes are in non-elicitable. Muscle strength is 4/5 right foot 3/5 certain muscle groups left foot hammertoe deformities 1 through 5 of the left foot. 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 Cutaneous evaluation demonstrates supple well hydrated cutis nails appear to be in pretty stitch a slightly thickened possibly secondary to mycosis. Mallet toe deformity of the hallux left does demonstrate superficial ulceration but not critical at this point.  Assessment: Diabetes with a history of diabetic peripheral neuropathy, history of stroke, hammertoe deformities with history of ulceration.  Plan: Discussed etiology pathology conservative versus surgical therapies. Discussed padding of the toe and particular shoe gear. And I will followup with her in 3 months for reevaluation.

## 2013-09-26 ENCOUNTER — Encounter: Payer: Self-pay | Admitting: Internal Medicine

## 2013-09-27 ENCOUNTER — Encounter: Payer: Self-pay | Admitting: Internal Medicine

## 2013-10-16 ENCOUNTER — Ambulatory Visit: Payer: Medicare HMO | Admitting: Podiatry

## 2013-10-26 ENCOUNTER — Encounter: Payer: Self-pay | Admitting: *Deleted

## 2013-10-28 ENCOUNTER — Encounter: Payer: Self-pay | Admitting: Internal Medicine

## 2013-11-15 ENCOUNTER — Encounter: Payer: Self-pay | Admitting: General Surgery

## 2013-11-15 ENCOUNTER — Ambulatory Visit (INDEPENDENT_AMBULATORY_CARE_PROVIDER_SITE_OTHER): Payer: Medicare HMO | Admitting: General Surgery

## 2013-11-15 VITALS — BP 108/64 | HR 96 | Resp 16 | Ht 66.0 in | Wt 253.0 lb

## 2013-11-15 DIAGNOSIS — Z1211 Encounter for screening for malignant neoplasm of colon: Secondary | ICD-10-CM

## 2013-11-15 MED ORDER — POLYETHYLENE GLYCOL 3350 17 GM/SCOOP PO POWD: ORAL | Status: DC

## 2013-11-15 MED ORDER — POLYETHYLENE GLYCOL 3350 17 GM/SCOOP PO POWD: 1.0000 | Freq: Once | ORAL | Status: DC

## 2013-11-15 NOTE — Progress Notes (Signed)
Patient ID: Molly [name], female   DOB: 1945/10/07, 68 y.o.   MRN: [id number]  Chief Complaint  Patient presents with  . Other    colonoscopy    HPI Molly [name] is a 68 y.o. female.  She is here today to discuss having a colonoscopy. Her last one was 2004. She has a known history of hemorrhoids and occasionally with the constipation they may bleed. Denies family history of colon cancer. Denies any other gastrointestinal issues.   HPI  Past Medical History  Diagnosis Date  . Diabetes   . Stroke 2008    left weakness  . Hemorrhoid     Past Surgical History  Procedure Laterality Date  . Colonoscopy  2004  . Cataract extraction      History reviewed. No pertinent family history.  Social History History  Substance Use Topics  . Smoking status: Never Smoker   . Smokeless tobacco: Not on file  . Alcohol Use: No    No Known Allergies  Current Outpatient Prescriptions  Medication Sig Dispense Refill  . AMITRIPTYLINE HCL PO Take by mouth daily.      . clopidogrel (PLAVIX) 75 MG tablet       . Insulin Aspart (NOVOLOG [place]) Inject into the skin daily.      Marland Kitchen lisinopril (PRINIVIL,ZESTRIL) 10 MG tablet       . losartan (COZAAR) 50 MG tablet       . metFORMIN (GLUCOPHAGE-XR) 500 MG 24 hr tablet       . NATTOKINASE PO Take by mouth daily.      . polyethylene glycol (MIRALAX / GLYCOLAX) packet Take 17 g by mouth daily.      . polyethylene glycol powder (GLYCOLAX/MIRALAX) powder 255 grams one bottle for colonoscopy prep  255 g  0  . polyethylene glycol powder (GLYCOLAX/MIRALAX) powder Take 255 g (1 Container total) by mouth once.  255 g  0   No current facility-administered medications for this visit.    Review of Systems Review of Systems  Constitutional: Negative.   Respiratory: Negative.   Cardiovascular: Negative.   Gastrointestinal: Positive for constipation. Negative for nausea and vomiting.    Blood pressure 108/64, pulse 96, resp. rate 16, height 5\' 6"  (1.676  m), weight 253 lb (114.76 kg).  Physical Exam Physical Exam  Constitutional: She is oriented to person, place, and time. She appears well-developed and well-nourished.  Eyes: Conjunctivae are normal. No scleral icterus.  Neck: Neck supple.  Cardiovascular: Normal rate, regular rhythm and normal heart sounds.   Pulmonary/Chest: Effort normal and breath sounds normal.  Abdominal: Soft. Normal appearance and bowel sounds are normal. There is no tenderness. No hernia.  Lymphadenopathy:    She has no cervical adenopathy.  Neurological: She is alert and oriented to person, place, and time.  History of CVA with left side weakness, walking with a cane.  Skin: Skin is warm and dry.    Data Reviewed Office notes.  Assessment     Pt appears to be in stable health, no further problems from her CVA. OK to proceed with screening colonoscopy.     Plan    Colonoscopy with possible biopsy/polypectomy prn: Information regarding the procedure, including its potential risks and complications (including but not limited to perforation of the bowel, which may require emergency surgery to repair, and bleeding) was verbally given to the patient. Educational information regarding lower instestinal endoscopy was given to the patient. Written instructions for how to complete the bowel prep using  Miralax were provided. The importance of drinking ample fluids to avoid dehydration as a result of the prep emphasized.    Patient has been scheduled for a colon[facility] on 12-06-13 [facility]. This patient is to stop Plavix five days prior and Nattokinase. She has also been asked to discontinue fish oil one week prior to procedure. Patient will not take morning dose of Novolog the day of prep and will decrease the evening dose by half.    [name] G 11/16/2013, 8:03 AM

## 2013-11-15 NOTE — Patient Instructions (Addendum)
Colonoscopy A colonoscopy is an exam to look at the entire large intestine (colon). This exam can help find problems such as tumors, polyps, inflammation, and areas of bleeding. The exam takes about 1 hour.  LET [facility]ARE PROVIDER KNOW ABOUT:   Any allergies you have.  All medicines you are taking, including vitamins, herbs, eye drops, creams, and over-the-counter medicines.  Previous problems you or members of your family have had with the use of anesthetics.  Any blood disorders you have.  Previous surgeries you have had.  Medical conditions you have. RISKS AND COMPLICATIONS  Generally, this is a safe procedure. However, as with any procedure, complications can occur. Possible complications include:  Bleeding.  Tearing or rupture of the colon wall.  Reaction to medicines given during the exam.  Infection (rare). BEFORE THE PROCEDURE   Ask your health care provider about changing or stopping your regular medicines.  You may be prescribed an oral bowel prep. This involves drinking a large amount of medicated liquid, starting the day before your procedure. The liquid will cause you to have multiple loose stools until your stool is almost clear or light green. This cleans out your colon in preparation for the procedure.  Do not eat or drink anything else once you have started the bowel prep, unless your health care provider tells you it is safe to do so.  Arrange for someone to drive you home after the procedure. PROCEDURE   You will be given medicine to help you relax (sedative).  You will lie on your side with your knees bent.  A long, flexible tube with a light and camera on the end (colonoscope) will be inserted through the rectum and into the colon. The camera sends video back to a computer screen as it moves through the colon. The colonoscope also releases carbon dioxide gas to inflate the colon. This helps your health care provider see the area better.  During  the exam, your health care provider may take a small tissue sample (biopsy) to be examined under a microscope if any abnormalities are found.  The exam is finished when the entire colon has been viewed. AFTER THE PROCEDURE   Do not drive for 24 hours after the exam.  You may have a small amount of blood in your stool.  You may pass moderate amounts of gas and have mild abdominal cramping or bloating. This is caused by the gas used to inflate your colon during the exam.  Ask when your test results will be ready and how you will get your results. Make sure you get your test results. Document Released: 03/13/2000 Document Revised: 01/04/2013 Document Reviewed: 11/21/2012 [facility] Patient Information 2015 [employer] [place]. This information is not intended to replace advice given to you by your health care provider. Make sure you discuss any questions you have with your health care provider.  Patient has been scheduled for a colon[facility] 12[facility]06-13 [facility]. This patient is to stop Plavix five days prior and Nattokinase. She has also been asked to discontinue fish oil one week prior to procedure. Patient will not take morning dose of Novolog the day of prep and will decrease the evening dose by half.

## 2013-11-16 ENCOUNTER — Encounter: Payer: Self-pay | Admitting: General Surgery

## 2013-11-28 ENCOUNTER — Encounter: Payer: Self-pay | Admitting: Internal Medicine

## 2013-12-06 ENCOUNTER — Ambulatory Visit: Payer: Self-pay | Admitting: General Surgery

## 2013-12-06 DIAGNOSIS — Z1211 Encounter for screening for malignant neoplasm of colon: Secondary | ICD-10-CM

## 2013-12-07 ENCOUNTER — Encounter: Payer: Self-pay | Admitting: General Surgery

## 2013-12-28 ENCOUNTER — Encounter: Payer: Self-pay | Admitting: Internal Medicine

## 2014-01-30 ENCOUNTER — Ambulatory Visit (INDEPENDENT_AMBULATORY_CARE_PROVIDER_SITE_OTHER): Payer: Medicare HMO | Admitting: Podiatry

## 2014-01-30 VITALS — BP 138/82 | HR 112 | Resp 16

## 2014-01-30 DIAGNOSIS — L97521 Non-pressure chronic ulcer of other part of left foot limited to breakdown of skin: Secondary | ICD-10-CM

## 2014-01-30 DIAGNOSIS — M79673 Pain in unspecified foot: Secondary | ICD-10-CM

## 2014-01-30 DIAGNOSIS — B351 Tinea unguium: Secondary | ICD-10-CM

## 2014-01-30 MED ORDER — CEPHALEXIN 500 MG PO CAPS: 500.0000 mg | ORAL_CAPSULE | Freq: Three times a day (TID) | ORAL | Status: DC

## 2014-01-30 MED ORDER — COLLAGENASE 250 UNIT/GM EX OINT: 1.0000 | TOPICAL_OINTMENT | Freq: Every day | CUTANEOUS | Status: DC

## 2014-01-30 NOTE — Patient Instructions (Signed)

## 2014-02-04 DIAGNOSIS — L97509 Non-pressure chronic ulcer of other part of unspecified foot with unspecified severity: Secondary | ICD-10-CM | POA: Insufficient documentation

## 2014-02-04 NOTE — Progress Notes (Signed)
Patient ID: Molly HughsBrenda P [name], female   DOB: February 01, 1946, 68 y.o.   MRN: [id number]  Subjective: [name], 68 year old female, presents the office today with a wound on the plantar aspect of the left foot which has been present for approximately 2-3 months and is not healing. She states that she has been applying some antibiotic ointment and a bandage over the area at times although she does leave it uncovered mostly. She denies any drainage or redness from around the site. She does state that the areas painful particularly with pressure. She denies any systemic complaints as fevers, chills, nausea, vomiting.  She has secondary complaints of painful elongated nails. She denies any redness or drainage from the nail sites. No other complaints at this time.  Objective: AAO x3, NAD DP/PT pulses palpable bilaterally, CRT less than 3 seconds Decreased protective sensation with SWMF. Ulceration on the plantar aspect of the left foot on the fifth metatarsal head measuring 2.5 x 2 cm. Periwound is hyperkerotic and the wound base is fibro-granular. There is a large amount of debris/dirt/hair within the wound. There is no probing, tunneling, undermining. There is no surrounding erythema, drainage, purulence. No fluctuance, crepitus. No ascending cellulitis.  Nails hypertrophic, dystrophic, elongated, discolored 10. No surrounding erythema or drainage. No other open lesions identified. No calf pain with compression, swelling, warmth, erythema. MMT 4/5, ROM WNL  Assessment: 68 year old female left plantar foot ulceration  Plan: -Conservative versus surgical treatment discussed including alternatives, risks, complications. -Wound sharply debrided to remove hyperkeratotic tissue and the wound base was debrided to healthy, bleeding, granular tissue. There is no probe to bone, purulence identified, although the wound itself was very dirty with a large amount of debris. Continue daily dressing changes and also  discussed washing with antibacterial soap daily. Santyl was prescribed. Discussed that if the Santyl is too expensive to continue with antibiotic ointment. Keflex was prescribed in case of underlying infection. Surgical shoe dispensed. Monitor for any clinical signs or symptoms of infection and directed to call the office immediately if any are to occur or go directly to the emergency room. -Nail sharply debrided 10 without complications. -Follow-up in 1 week or sooner if any problems are to arise or any change in symptoms. In the meantime call the office with any questions, concerns.  -X-ray next appointment

## 2014-02-08 ENCOUNTER — Ambulatory Visit (INDEPENDENT_AMBULATORY_CARE_PROVIDER_SITE_OTHER): Payer: Medicare HMO

## 2014-02-08 ENCOUNTER — Ambulatory Visit (INDEPENDENT_AMBULATORY_CARE_PROVIDER_SITE_OTHER): Payer: Medicare HMO | Admitting: Podiatry

## 2014-02-08 VITALS — BP 140/62 | HR 106 | Resp 16

## 2014-02-08 DIAGNOSIS — E119 Type 2 diabetes mellitus without complications: Secondary | ICD-10-CM

## 2014-02-08 DIAGNOSIS — L97521 Non-pressure chronic ulcer of other part of left foot limited to breakdown of skin: Secondary | ICD-10-CM

## 2014-02-08 NOTE — Patient Instructions (Signed)
Continue antibiotic ointment and dressing daily.  Monitor for any signs/symptoms of infection. Call the office immediately if any occur or go directly to the emergency room. Call with any questions/concerns.

## 2014-02-11 NOTE — Progress Notes (Signed)
Patient ID: Molly [name], female   DOB: Jan 28, 1946, 68 y.o.   MRN: [id number]  Subjective: 68 year old female returns the office they with her husband for follow-up evaluation of left plantar foot ulceration. She states that she has been continuing with  Dressing changes however showing changes at every couple days. She states that she's been applying some homeopathic medications over the wound. She was unable to purchase the prescribed medications that she's been applying antibiotic ointment over the wound is well. Denies any recent fevers, chills, nausea, vomiting. Denies any redness or any significant drainage or purulence identified from the wound. No acute changes since last appointment. No other complaints at this time.  Objective: AAO 3, NAD DP/PT pulses palpable bilaterally, CRT less than 3 seconds Decreased protective sensation with Simms Weinstein monofilament. Ulceration on the plantar aspect of left foot under the fifth metatarsal head measuring approximately 2 x 2 centimeters. The periwound is hyperkeratotic in the wound base is fibro-granular. The wound does appear to be much cleaner at this appointment compared to prior. There is no probing, undermining, tunneling. There is no surrounding erythema, purulence, drainage, fluctuance, crepitus. No ascending cellulitis. No other lesions identified. No calf pain, swelling, warmth, erythema.  Assessment: 68 year old female with left plantar foot ulceration  Plan: -x-rays were obtained and reviewed with the patient. No acute changes to suggest osteomyelitis. -Conservative versus surgical options discussed including alternatives, risks, complications. -Wound sharply debrided of hyperkeratotic tissue and the wound was debrided to healthy, bleeding, granular wound base. Silvadene was applied followed by dry sterile dressing. Continue daily dressing changes. Also wash that with antibacterial soap daily followed by a dressing. -Continue with  surgical shoe. -Monitor for any clinical signs or symptoms of infection and directed to call the office immediately if any are to occur or go to the emergency room. -Follow-up in 10 days or sooner if any problems are to arise. In the meantime, call the office with any questions, concerns, change in symptoms. If there is no snapping improvement in the wound will likely refer to the wound care center.

## 2014-02-27 ENCOUNTER — Ambulatory Visit (INDEPENDENT_AMBULATORY_CARE_PROVIDER_SITE_OTHER): Payer: Medicare HMO | Admitting: Podiatry

## 2014-02-27 VITALS — BP 149/74 | HR 109 | Resp 16

## 2014-02-27 DIAGNOSIS — L97521 Non-pressure chronic ulcer of other part of left foot limited to breakdown of skin: Secondary | ICD-10-CM

## 2014-02-27 NOTE — Progress Notes (Signed)
Patient ID: Ardis HughsBrenda P Gilpatrick, female   DOB: Jun 12, 1945, 68 y.o.   MRN: [id number]  Subjective: 68 year old female returns the office they with her husband for follow-up evaluation of left plantar foot ulceration. She states that she's been continuing with daily dressing changes and she's been applying some homeopathic ointments to the area. She has been continuing with surgical shoe. She denies any systemic complaints such as fevers, chills, nausea, vomiting. She does that she periodically gets some bloody discharge from the wound for which she notices on her bandage. She denies any purulence. Denies any redness or any streaking. No other complaints at this time.  Objective: AAO 3, NAD DP/PT pulses palpable bilaterally, CRT less than 3 seconds Decreased protective sensation with Simms Weinstein monofilament, decreased vibratory sensation. Ulceration of the plantar aspect of the left foot submetatarsal 5 measuring 2 x 2 centimeters, but does appear to be more superficial. Periwound is hyperkeratotic in the wound base is granular. There is no surrounding erythema, ascending cellulitis. No areas of fluctuance, crepitus, malodor. No clinical signs of infection. No probing, undermining, tunneling. No other lesions identified.  Pain with calf compression, swelling, warmth, erythema.  Assessment: 68 year old female left foot second metatarsal 5 ulceration.  Plan: -Treatment options were discussed including alternatives, risks, complications. -Left foot wound was sharply debrided without complications to healthy, bleeding, granular tissue and the hyperkeratotic periwound was sharply debrided. Recommended continue with daily dressing changes. -Continue a surgical shoe. Padding was applied to the shoe to help offload the wound. -Monitor for any clinical signs or symptoms of infection and directed to call the office immediately if any are to occur or go to the emergency room. -Follow-up in 10 days or sooner  if any problems are to arise. In the meantime, call the office with any questions, concerns, change in symptoms. Does with her that if there is not much improvement within the next couple weeks we'll likely refer to the wound care center.

## 2014-02-27 NOTE — Patient Instructions (Signed)
Continue daily dressing changes

## 2014-03-09 ENCOUNTER — Telehealth: Payer: Self-pay | Admitting: Podiatry

## 2014-03-09 NOTE — Telephone Encounter (Signed)
Received a call from the physical therapist for which [name] sees. Physical therapist called to discuss the wound on the patient's left foot. She states that she believes that the wound is getting deeper has been changing. She was stating at they haven't changed the dressing every 3-4 days and that the bandage was applied is extremely tight almost treating the tourniquet on her foot. I discussed at the last appointment the proper way to change the dressing should be changed daily. There also continuing with homeopathic treatments. The physical therapist this concern because there is new wounds which are starting to develop. There was no new ulcerations identified last appointment and these must be new since last evaluation. After a discussion due to the worsening of the wound on the left in the new ulcerations will refer the patient to the wound care center for further treatment. The office will call the patient to set this up. I discussed the possibility this with the patient last appointment and they were opened to further evaluation of the wound care center.

## 2014-03-12 ENCOUNTER — Encounter: Payer: Self-pay | Admitting: Surgery

## 2014-03-13 ENCOUNTER — Ambulatory Visit (INDEPENDENT_AMBULATORY_CARE_PROVIDER_SITE_OTHER): Payer: Medicare HMO | Admitting: Podiatry

## 2014-03-13 VITALS — BP 147/77 | HR 100 | Resp 16

## 2014-03-13 DIAGNOSIS — L97521 Non-pressure chronic ulcer of other part of left foot limited to breakdown of skin: Secondary | ICD-10-CM

## 2014-03-13 NOTE — Patient Instructions (Signed)
Continue with daily dressing changes with iodosorb.  Monitor for any signs/symptoms of infection. Call the office immediately if any occur or go directly to the emergency room. Call with any questions/concerns.

## 2014-03-14 NOTE — Progress Notes (Signed)
Patient ID: Molly [name], female   DOB: [birth year], 68 y.o.   MRN: [id number]  Subjective: 68 year old female returns the office today with her husband for follow-up evaluation of left plantar foot ulceration. Since last appointment the patient was seen by the wound care center with a recommended to continue Iodosorb on the wound in per the patient they discussed possible skin grafting. The patient states that she has started using Iodosorb and changing the dressing daily. She denies any redness or drainage from around the wound and reports only bloody drainage intermittently and is mild. She denies any systemic complaints as fevers, chills, nausea, vomiting. No other ulcerations per the patient. No other acute changes since last appointment and no new complaints at this time.  Objective: AAO 3, NAD DP/PT pulses palpable bilaterally, CRT less than 3 seconds Decreased protective sensation with Simms Weinstein monofilament, decreased vibratory sensation. Ulcer at the plantar aspect the left foot submetatarsal 5 measuring approximately 1.7 x 1.7 cm and continues to appear or superficial. There is no evidence of probing, undermining, tunneling. The periwound is slightly hyperkeratotic. There is no swelling erythema, edema, increased warmth, ascending cellulitis. No areas of fluctuance or crepitus or malodor. No clinical signs of infection are present. There are no other open lesions present to the bilateral feet. No pain with calf compression, swelling, warmth, erythema.  Assessment: 68 year old female with left foot submetatarsal 5 ulceration  Plan: -Treatment options were discussed with the patient including alternatives, risks, complications. -The would was sharply debrided hyperkeratotic tissue and the wound base was debrided down to healthy, bleeding, granular tissue. The area was then cleaned. Eyes or was applied followed by a dry sterile dressing. Recommend the patient to continue to do this.  Patient also has another follow-up appointment with wound care next week. Patient is unsure if she want to continue to follow up with the wound care center. I discussed with her that they have other modalities which can be performed to help heal this wound. She states that she'll see how the wound looks prior to her appointment if she will go or not. -Patient does appear to all of her weight on her forefoot upon gait and does not put any weight into the heel. I again re-padded the surgical shoe to help offload the area. -Continue to monitor ankle signs or symptoms of infection and directed to call the office immediately should any occur or go directly to the emergency room. -Follow-up in 2 weeks or sooner if any problems are to arise. In the meantime, call the office with any questions, concerns, change in symptoms. **X-ray next appointment appointment.

## 2014-03-27 ENCOUNTER — Ambulatory Visit: Payer: Medicare HMO | Admitting: Podiatry

## 2014-03-27 ENCOUNTER — Ambulatory Visit (INDEPENDENT_AMBULATORY_CARE_PROVIDER_SITE_OTHER): Payer: Medicare HMO | Admitting: Podiatry

## 2014-03-27 VITALS — BP 120/97 | HR 104 | Resp 18

## 2014-03-27 DIAGNOSIS — L97509 Non-pressure chronic ulcer of other part of unspecified foot with unspecified severity: Secondary | ICD-10-CM

## 2014-03-27 DIAGNOSIS — L97521 Non-pressure chronic ulcer of other part of left foot limited to breakdown of skin: Secondary | ICD-10-CM

## 2014-03-27 DIAGNOSIS — L600 Ingrowing nail: Secondary | ICD-10-CM

## 2014-03-27 NOTE — Patient Instructions (Signed)
Continue daily dressing changes. Monitor for any signs/symptoms of infection. Call the office immediately if any occur or go directly to the emergency room. Call with any questions/concerns.  

## 2014-03-28 NOTE — Progress Notes (Signed)
Patient ID: Molly HughsBrenda P Backer, female   DOB: September 02, 1945, 68 y.o.   MRN: [id number]  Subjective: 68 year old female returns the office today for follow up evaluation of left foot submetatarsal 5 ulceration. She states that she has continue with every other day dressing changes with Iodosorb. She states that the wound is getting better and is not as deep as it has been. She denies any recent redness or red streaks from around the area. Small and a bloody drainage however denies any purulence. Patient also is requesting ingrown toenail on the left lateral nail border to be debrided. She states that she has been cutting out the area herself, but has continued to be symptomatic. She denies any systemic complaints as fevers, chills, nausea, vomiting. No other complaints at this time in no acute changes since last appointment. Patient has not come back to the wound care center.  Objective:  AAO 3, NAD DP/PT pulses palpable, CRT less than 3 seconds Protective sensation decreased with Simms Weinstein monofilament, decreased vibratory sensation. Ulceration of the plantar aspect of the fifth left foot submetatarsal 5 which measures approximately the same as last appointment in width and diameter measuring probably 1.7 x 1.7 cm. The wound does appear to be more superficial and is granulating in. There is a small amount of hyperkeratotic tissue around the rim of the wound. There is no probing, undermining, tunneling. There is no ascending cellulitis. There is no surrounding erythema, edema, increase in warmth. No areas of fluctuance or crepitus. No clinical signs or symptoms of infection. Left lateral nail border slightly incurvated. There is no evidence of dried blood from around the nail border for the patient has tried to debride the nail border herself. No surrounding erythema, edema, increased warmth or other clinical signs of infection. Remaining nails without discomfort at this time. No other open lesions  identified. No pain with calf compression, swelling, warmth, erythema.  Assessment: 68 year old female pop evaluation left foot submetatarsal 5 ulceration; left lateral hallux ingrown toenail  Plan: -Treatment options were discussed the patient including alternatives, risks, complications. -Left lateral hallux nail border was sharply debrided without complications to remove the ingrowing portion of the toenail. Discussed the patient not to try to trim out the ingrown toenail herself. Discussed that if the area continues to be symptomatic or if there becomes any signs or symptoms of infection would likely need to proceed with a partial nail avulsion. However, this time the patient wishes to hold off. -Left submetatarsal 5 ulceration sharply debrided without complications to healthy, bleeding, granular wound base. No signs of infection. Continue with daily dressing changes as opposed every other day. Continue surgical shoe with offloading pad. Patient states that she does not want to continue wearing the surgical shoe although I recommended her to do so. She states that she will go back into a regular sneaker however. I did dispense to her offloading pads in the event she disconnected the sneaker although I highly recommend against it. -Follow-up in 2 weeks or sooner should any problems arise. In the meantime, call the office with any questions, concerns, change in symptoms.

## 2014-04-12 ENCOUNTER — Ambulatory Visit (INDEPENDENT_AMBULATORY_CARE_PROVIDER_SITE_OTHER): Payer: Medicare HMO | Admitting: Podiatry

## 2014-04-12 VITALS — BP 120/97 | HR 104 | Resp 16

## 2014-04-12 DIAGNOSIS — L97521 Non-pressure chronic ulcer of other part of left foot limited to breakdown of skin: Secondary | ICD-10-CM

## 2014-04-12 DIAGNOSIS — B351 Tinea unguium: Secondary | ICD-10-CM

## 2014-04-12 DIAGNOSIS — M79673 Pain in unspecified foot: Secondary | ICD-10-CM

## 2014-04-12 DIAGNOSIS — L84 Corns and callosities: Secondary | ICD-10-CM

## 2014-04-12 NOTE — Patient Instructions (Signed)
Monitor for any signs/symptoms of infection. Call the office immediately if any occur or go directly to the emergency room. Call with any questions/concerns.  

## 2014-04-12 NOTE — Progress Notes (Addendum)
Patient ID: Molly HughsBrenda P [name], female   DOB: 01-30-1946, 69 y.o.   MRN: [id number]  Subjective: 69 year old female returns the office today for follow up evaluation of left foot submetatarsal 5 ulceration. She has continued with every other day dressing changes with Iodosorb. She continues to state that the wound "doing fine". She denies any redness or streaking from around the area.she states that she has not been wearing the surgical shoe because it makes her off balance. She is also requesting that her toenails be trimmed on her right foot as they are elongated and causing pain. She denies any systemic complaints as fevers, chills, nausea, vomiting. No other complaints at this time in no acute changes since last appointment. Patient has not come back to the wound care center.  Objective:  AAO 3, NAD DP/PT pulses palpable, CRT less than 3 seconds Protective sensation decreased with Simms Weinstein monofilament, decreased vibratory sensation. Ulceration of the plantar aspect of the fifth left foot submetatarsal 5 which measures approximately the same as last appointment in width and diameter measuring probably 1.7 x 1.7 cm. There is a small amount of hyperkeratotic tissue around the rim of the wound, which appears to be less compared to prior appointment. There is no probing, undermining, tunneling. There is no ascending cellulitis. There is no surrounding erythema, edema, increase in warmth. No areas of fluctuance or crepitus. No clinical signs or symptoms of infection. Nails on the right foot are dystrophic, discolored, elongated, hypertrophic. There is subjective tenderness on the nail borders. No swelling erythema or drainage. However, on the right 5th digit nail there is dried blood from where the patient tried to cut the nail. No signs of infection at this time.  Hyperkeratotic lesion on the medial aspect of the right hallux and first MTPJ. Upon debridement no underlying ulceration and no signs of  infection. No pain with calf compression, swelling, warmth, erythema.  Assessment: 69 year old female f/u evaluation left foot submetatarsal 5 ulceration; left foot symptomatic onychomycosis; hyperkeratotic lesions right foot 2   Plan: -Treatment options were discussed the patient including alternatives, risks, complications. -Left submetatarsal 5 ulceration sharply debrided without complications to healthy, bleeding, granular wound base. No signs of infection. Continue with daily dressing changes as opposed every other day. Continue surgical shoe with offloading pad. Patient states that she does not want to continue wearing the surgical shoe although I recommended her to do so. She states that her physical therapist had discussed with her possible shoe to help stabilize her ankle as well. Given the patient's gait and her instability with a surgical shoe I don't and that she would tolerate a cam walker. Also discussed the patient as the wound has remained about the same size over the last couple weeks she should follow up with the wound care center as they had previously discussed with her possible grafting or if there is any other advanced wound care modalities they can offer her.  -Nail sharply debrided on the right foot without complication/bleeding. There is evidence of blood around the fifth digit toenail, the patient had previous a cut this. I discussed with her not to cut her nails herself. Monitor for signs of infection. -Hyperkeratotic lesions right foot sharply debrided 2 without complication/bleeding. -Follow-up in 2 weeks or sooner should any problems arise. In the meantime, call the office with any questions, concerns, change in symptoms.  *I called and left the wound care center a voicemail about Molly [name] following up with them.

## 2014-04-13 ENCOUNTER — Telehealth: Payer: Self-pay | Admitting: *Deleted

## 2014-04-13 NOTE — Telephone Encounter (Signed)
-----   Message from Ovid CurdMatthew Wagoner, DPM sent at 04/12/2014  5:31 PM EST ----- I had discussed with her today to go back to the wound care center to be re-evaluated. They apparently had discussed doing some kind of graft for her. I think she should go back. I did call and leave them a voicemail to see if they could get her in. If you could follow-up on that, I would appreciate it! Thanks

## 2014-04-13 NOTE — Telephone Encounter (Signed)
Called wound care spoke with sheila. Stated she gave the message to the nurse, and they will call her and they need a new referral filled out.

## 2014-04-17 ENCOUNTER — Telehealth: Payer: Self-pay | Admitting: Podiatry

## 2014-04-17 NOTE — Telephone Encounter (Signed)
OK - thanks

## 2014-04-17 NOTE — Telephone Encounter (Signed)
Wound care center called to

## 2014-04-17 NOTE — Telephone Encounter (Signed)
Wound care center called to let you know pt canceled her appt for Thursday.

## 2014-04-26 ENCOUNTER — Ambulatory Visit (INDEPENDENT_AMBULATORY_CARE_PROVIDER_SITE_OTHER): Payer: Medicare HMO | Admitting: Podiatry

## 2014-04-26 VITALS — BP 105/69 | HR 112 | Resp 16

## 2014-04-26 DIAGNOSIS — L97509 Non-pressure chronic ulcer of other part of unspecified foot with unspecified severity: Secondary | ICD-10-CM

## 2014-04-26 DIAGNOSIS — L600 Ingrowing nail: Secondary | ICD-10-CM

## 2014-04-26 DIAGNOSIS — L97521 Non-pressure chronic ulcer of other part of left foot limited to breakdown of skin: Secondary | ICD-10-CM

## 2014-05-01 ENCOUNTER — Telehealth: Payer: Self-pay | Admitting: *Deleted

## 2014-05-01 NOTE — Telephone Encounter (Signed)
She can bring it with her to her next appointment. Thanks.

## 2014-05-01 NOTE — Progress Notes (Signed)
Patient ID: Ardis HughsBrenda P Ponzo, female   DOB: 21-Oct-1945, 69 y.o.   MRN: [id number]  Subjective: 69 year old female returns the office today for continued care of left submetatarsal 5 ulceration. She states that she has been continued at his work dressing changes every other day. Her husband does state that he believes that there is more callus buildup at today's appointment compared to what it was previously. She does wear the surgical shoe at times however she does go without it. There is a small Mena bloody drainage from the wound, the denies any purulence. Denies any surrounding erythema. She has no pain to the wound. They canceled her point over the wound care center. She is also asking me to look at the left big toe as she believes she has an ingrown toenail again. She denies any recent redness or drainage from the nail site however it is painful. No other complaints at this time.  Objective: AAO 3, NAD DP/PT pulses palpable, CRT less than 3 seconds Decreased protective sensation with Simms Weinstein monofilament, decreased vibratory sensation, Achilles tendon reflex intact. Ulceration on the plantar aspect left foot submetatarsal 5 which is approximate the same diameters it was previously. The wound base is fibrotic. There is hyperkeratotic tissue around the periwound. There is no surrounding erythema, ascending cellulitis. A small amount of sanguinous drainage is identified however no purulence. There is no probing, undermining, tunneling. No areas of fluctuance or crepitus. No tenderness upon palpation of the wound. No other open lesions or pre-ulcer lesions identified bilateral lower chemise. Left lateral aspect of the hallux nail with evidence of incurvation of the nail border. There is mild tender sharply around this area and small amount of dried blood within the nail. No swelling erythema or drainage. No other areas of tenderness to bilateral lower extremities. No pain with calf compression,  swelling, warmth, erythema.  Assessment: 69 year old female with left submetatarsal 5 ulceration, ingrown toenail without infection left lateral hallux.  Plan: -Treatment options were discussed with the patient including alternatives, risks, complications. -Wound was sharply debrided without complications. The wound was debrided to healthy, bleeding, granular tissue. Recommended continue with daily dressing changes and clean the wound with antibacterial soap prior to the dressing. This should be performed daily. Continue with surgical shoe and offloading. -Will switch to [employer]. An order form for this was completed today and will be delivered to the patient's house. Informed to bring this product with her to the next appointment. -Left lateral hallux nail shop and debrided without complications/bleeding. Continue to monitor. -Monitor for any signs or symptoms of infection and directed to call the office immediately should any occur or go to the ER. Also can recommended patient follow-up with wound care center. -Follow-up in 2 weeks or sooner should any problems arise. In the meantime, cursor call the office with any questions, concerns, change in symptoms. *x-ray next appt

## 2014-05-01 NOTE — Telephone Encounter (Addendum)
Dr. Ardelle AntonWagoner ordered some stuff for me, I received it yesterday, but I don't know how to use it.  Dr. Ardelle AntonWagoner states have pt bring the medication with her at next visit.  Left message [phone].

## 2014-05-02 ENCOUNTER — Telehealth: Payer: Self-pay | Admitting: Podiatry

## 2014-05-02 NOTE — Telephone Encounter (Signed)
She can come in next Tuesday. Ask her to bring it with her. Thanks.

## 2014-05-02 NOTE — Telephone Encounter (Signed)
I scheduled Steward DroneBrenda to come in next Tuesday at 9:00 am and she knows to bring the graft with her.

## 2014-05-02 NOTE — Telephone Encounter (Signed)
Patient called saying she has been calling the past 2-3 days in regards to the skin graft you ordered. She stated it arrived at her place 1-2 days ago and wants to know if you wanted her to come in sooner to go ahead and get started. If you would please call her back. Thanks.

## 2014-05-08 ENCOUNTER — Ambulatory Visit (INDEPENDENT_AMBULATORY_CARE_PROVIDER_SITE_OTHER): Payer: Medicare HMO

## 2014-05-08 ENCOUNTER — Encounter: Payer: Self-pay | Admitting: Podiatry

## 2014-05-08 ENCOUNTER — Ambulatory Visit (INDEPENDENT_AMBULATORY_CARE_PROVIDER_SITE_OTHER): Payer: Medicare HMO | Admitting: Podiatry

## 2014-05-08 VITALS — BP 152/92 | HR 106 | Resp 18

## 2014-05-08 DIAGNOSIS — L97522 Non-pressure chronic ulcer of other part of left foot with fat layer exposed: Secondary | ICD-10-CM

## 2014-05-08 DIAGNOSIS — L8991 Pressure ulcer of unspecified site, stage 1: Secondary | ICD-10-CM

## 2014-05-08 NOTE — Patient Instructions (Signed)
Continue daily dressing changes. Monitor for any signs/symptoms of infection. Call the office immediately if any occur or go directly to the emergency room. Call with any questions/concerns.  

## 2014-05-09 NOTE — Progress Notes (Signed)
Patient ID: Molly [name], female   DOB: [birth year], 69 y.o.   MRN: [id number]  Subjective: 69 year old female presents the office they for follow-up evaluation of left foot wound. She states that she has been up and on her foot more than what she normally is. She states that the wound has had some bloody drainage her she denies any pus. She denies any surrounding redness or any red streaks. She does not want to wear the surgical shoe. She also states that she has been applying lemon grass oil another natural oils to the wound along with the Iodosorb. Denies any systemic complaints as fevers, chills, nausea, vomiting. No other complaints timing no acute changes since last appointment.  She does state that she has entered a clinical trial to assess blood flow.  Objective: AAO 3, NAD DP/PT pulses palpable, CRT less than 3 seconds Decreased protective sensation with Simms Weinstein monofilament Ulceration of the plantar aspect of the left foot second metatarsal 5 which measures proximally same in diameter however does appear to be deeper at today's appointment. There is a small amount of tendon exposed. The wound base is fibro-granular. There is hyperkeratotic periwound. There is no surrounding erythema or ascending cellulitis. No purulence is expressed. There is no probing, undermining, tunneling. No areas of fluctuance or crepitus. No other open lesions or pre-ulcer lesions identified. No pain with calf compression, swelling, warmth, erythema.  Assessment: 69 year old female with left some metatarsal 5 ulceration  Plan: -X-rays were obtained and reviewed the patient. There is. Cortical destruction distress osteomyelitis and no soft tissue emphysema. -Wound sharply debrided to healthy, bleeding, granular wound base. The area was cleaned. Prisma was applied followed by a dressing. Recommend the patient to continue this on a daily basis. I recommended against using the various oils again at today's  appointment.  -I hasn't discussed wearing the surgical shoe and/or another form of immobilization to help offload the wound however she does not want this and she were to continue with the shoe. -Discussed obtaining vascular studies with the patient/husband although she's got palpable pulses. They state that they have entered a trial for blood flow and they would like to see them in order to not have duplication tests. I asked them to bring copies of the reports with them. -Follow-up in 1 week or sooner if any problems are to arise. In the meantime, encouraged to call the office with any questions, concerns, change in symptoms.

## 2014-05-15 ENCOUNTER — Ambulatory Visit: Payer: Medicare HMO | Admitting: Podiatry

## 2014-05-17 ENCOUNTER — Ambulatory Visit (INDEPENDENT_AMBULATORY_CARE_PROVIDER_SITE_OTHER): Payer: Medicare HMO | Admitting: Podiatry

## 2014-05-17 DIAGNOSIS — L97524 Non-pressure chronic ulcer of other part of left foot with necrosis of bone: Secondary | ICD-10-CM

## 2014-05-17 DIAGNOSIS — L89891 Pressure ulcer of other site, stage 1: Secondary | ICD-10-CM

## 2014-05-17 MED ORDER — CLINDAMYCIN HCL 300 MG PO CAPS: 300.0000 mg | ORAL_CAPSULE | Freq: Three times a day (TID) | ORAL | Status: DC

## 2014-05-17 MED ORDER — CIPROFLOXACIN HCL 500 MG PO TABS: 500.0000 mg | ORAL_TABLET | Freq: Two times a day (BID) | ORAL | Status: DC

## 2014-05-17 NOTE — Patient Instructions (Signed)
Monitor for any signs/symptoms of infection. Call the office immediately if any occur or go directly to the emergency room. Call with any questions/concerns.  

## 2014-05-18 ENCOUNTER — Telehealth: Payer: Self-pay | Admitting: *Deleted

## 2014-05-18 MED ORDER — CLINDAMYCIN HCL 150 MG PO CAPS: 150.0000 mg | ORAL_CAPSULE | Freq: Three times a day (TID) | ORAL | Status: DC

## 2014-05-18 NOTE — Telephone Encounter (Addendum)
Pt states the Cipro, Cleocin are bothering her stomach, vomited once.  Pt states if they are going to be the best for her foot she'll continue. I spoke with pt and informed her that Dr. Ardelle AntonWagoner decreased the Clindamycin to 150mg  1 tid and to take it separate from the Ciprofloxacin and each with some food..  Pt agreed.

## 2014-05-20 NOTE — Progress Notes (Signed)
Patient ID: Molly [name], female   DOB: [birth year], 69 y.o.   MRN: [id number]  Subjective: 69 year old female presents the office they for follow-up evaluation of left foot wound. She states that overall she is doing well. She states that the wound has continued to have a little bit of bloody drainage have her she continues to deny any purulence from the area. She and her husband denies any redness or any red streaks around the area. She intermittently wears a surgical shoe with offloading pads. They have an continue with the Prisma on an every other day basis. She denies any systemic complaints as fevers, chills, nausea, vomiting. No other complaints at this time in no acute changes his last appointment.  Objective: AAO 3, NAD DP/PT pulses palpable, CRT less than 3 seconds Decreased protective sensation with Simms Weinstein monofilament Annular ulceration on the plantar aspect of the left submetatarsal 5 measuring 3.5 x 3 cm. There is hyperkeratotic periwound. The wound does probe and fifth metatarsal head is exposed at today's appointment. There is a central area of necrotic tissue within the wound. There is mild malodor at today's appointment. There is no undermining or tunneling. There is no surrounding erythema or ascending cellulitis. There is no increase in warmth. There is no areas of fluctuance or crepitus. There is no purulence expressed. No other open lesions or pre-ulcerative lesions are identified bilaterally. No other open lesions or pre-ulcer lesions identified. No pain with calf compression, swelling, warmth, erythema.  Assessment: 69 year old female with left some metatarsal 5 ulceration, with exposed bone at today's appointment.  Plan: -Treatment options both conservative and surgical were discussed with the patient including alternatives, risks, complications. -Wound sharply debrided to healthy, bleeding, granular wound base. The area was cleaned. Saline wet-to-dry dressing  was applied followed by dry sterile dressing. Recommend the patient to continue this on a DAILY basis.  -I discussed with the patient that she would likely require surgical intervention which would include debridement of the wound and excision of the fifth metatarsal head likely. -I also discussed further evaluation with the wound care center in regards to possible hyperbarics. She would like to be evaluated by the wound care center. An appointment was made for the patient to be seen on Monday, 04/20/2014. -Prescribed clindamycin and ciprofloxacin. Monitor closely for any signs or symptoms of infection and directed to go to the emergency room immediately should any occur. -Follow-up in 1 week or sooner if any problems are to arise. In the meantime, encouraged to call the office with any questions, concerns, change in symptoms.

## 2014-05-21 ENCOUNTER — Telehealth: Payer: Self-pay | Admitting: *Deleted

## 2014-05-21 ENCOUNTER — Encounter
Admit: 2014-05-21 | Disposition: A | Discharge status: Home / Self Care | Payer: Self-pay | Attending: Surgery | Admitting: Surgery

## 2014-05-21 ENCOUNTER — Encounter (HOSPITAL_COMMUNITY): Payer: Self-pay | Admitting: Emergency Medicine

## 2014-05-21 ENCOUNTER — Emergency Department (HOSPITAL_COMMUNITY): Payer: Medicare HMO

## 2014-05-21 ENCOUNTER — Inpatient Hospital Stay (HOSPITAL_COMMUNITY)
Admission: EM | Admit: 2014-05-21 | Discharge: 2014-05-25 | DRG: 617 | Disposition: A | Discharge status: Skilled Nursing Facility | Payer: Medicare HMO | Attending: Internal Medicine | Admitting: Internal Medicine

## 2014-05-21 ENCOUNTER — Encounter: Payer: Self-pay | Admitting: Surgery

## 2014-05-21 DIAGNOSIS — Z6841 Body Mass Index (BMI) 40.0 and over, adult: Secondary | ICD-10-CM | POA: Diagnosis not present

## 2014-05-21 DIAGNOSIS — Z7902 Long term (current) use of antithrombotics/antiplatelets: Secondary | ICD-10-CM | POA: Diagnosis not present

## 2014-05-21 DIAGNOSIS — E1169 Type 2 diabetes mellitus with other specified complication: Principal | ICD-10-CM | POA: Diagnosis present

## 2014-05-21 DIAGNOSIS — Z794 Long term (current) use of insulin: Secondary | ICD-10-CM | POA: Diagnosis not present

## 2014-05-21 DIAGNOSIS — L97524 Non-pressure chronic ulcer of other part of left foot with necrosis of bone: Secondary | ICD-10-CM | POA: Diagnosis present

## 2014-05-21 DIAGNOSIS — D631 Anemia in chronic kidney disease: Secondary | ICD-10-CM | POA: Diagnosis present

## 2014-05-21 DIAGNOSIS — E1142 Type 2 diabetes mellitus with diabetic polyneuropathy: Secondary | ICD-10-CM | POA: Diagnosis present

## 2014-05-21 DIAGNOSIS — I129 Hypertensive chronic kidney disease with stage 1 through stage 4 chronic kidney disease, or unspecified chronic kidney disease: Secondary | ICD-10-CM | POA: Diagnosis present

## 2014-05-21 DIAGNOSIS — L03116 Cellulitis of left lower limb: Secondary | ICD-10-CM | POA: Diagnosis present

## 2014-05-21 DIAGNOSIS — E669 Obesity, unspecified: Secondary | ICD-10-CM | POA: Diagnosis present

## 2014-05-21 DIAGNOSIS — E11622 Type 2 diabetes mellitus with other skin ulcer: Secondary | ICD-10-CM

## 2014-05-21 DIAGNOSIS — M869 Osteomyelitis, unspecified: Secondary | ICD-10-CM | POA: Diagnosis present

## 2014-05-21 DIAGNOSIS — M86172 Other acute osteomyelitis, left ankle and foot: Secondary | ICD-10-CM | POA: Diagnosis present

## 2014-05-21 DIAGNOSIS — I69354 Hemiplegia and hemiparesis following cerebral infarction affecting left non-dominant side: Secondary | ICD-10-CM

## 2014-05-21 DIAGNOSIS — E11649 Type 2 diabetes mellitus with hypoglycemia without coma: Secondary | ICD-10-CM | POA: Diagnosis not present

## 2014-05-21 DIAGNOSIS — N183 Chronic kidney disease, stage 3 (moderate): Secondary | ICD-10-CM | POA: Diagnosis present

## 2014-05-21 DIAGNOSIS — L97529 Non-pressure chronic ulcer of other part of left foot with unspecified severity: Secondary | ICD-10-CM | POA: Diagnosis present

## 2014-05-21 DIAGNOSIS — E785 Hyperlipidemia, unspecified: Secondary | ICD-10-CM

## 2014-05-21 DIAGNOSIS — E1151 Type 2 diabetes mellitus with diabetic peripheral angiopathy without gangrene: Secondary | ICD-10-CM | POA: Diagnosis present

## 2014-05-21 DIAGNOSIS — E11621 Type 2 diabetes mellitus with foot ulcer: Secondary | ICD-10-CM | POA: Diagnosis present

## 2014-05-21 DIAGNOSIS — E1165 Type 2 diabetes mellitus with hyperglycemia: Secondary | ICD-10-CM | POA: Diagnosis present

## 2014-05-21 DIAGNOSIS — E119 Type 2 diabetes mellitus without complications: Secondary | ICD-10-CM

## 2014-05-21 DIAGNOSIS — L98499 Non-pressure chronic ulcer of skin of other sites with unspecified severity: Secondary | ICD-10-CM

## 2014-05-21 DIAGNOSIS — D649 Anemia, unspecified: Secondary | ICD-10-CM

## 2014-05-21 LAB — CBC WITH DIFFERENTIAL/PLATELET
BASOS PCT: 0 % (ref 0–1)
Basophils Absolute: 0 10*3/uL (ref 0.0–0.1)
EOS ABS: 0.3 10*3/uL (ref 0.0–0.7)
Eosinophils Relative: 2 % (ref 0–5)
HCT: 35.9 % — ABNORMAL LOW (ref 36.0–46.0)
Hemoglobin: 11.5 g/dL — ABNORMAL LOW (ref 12.0–15.0)
LYMPHS ABS: 1.6 10*3/uL (ref 0.7–4.0)
Lymphocytes Relative: 12 % (ref 12–46)
MCH: 29.7 pg (ref 26.0–34.0)
MCHC: 32 g/dL (ref 30.0–36.0)
MCV: 92.8 fL (ref 78.0–100.0)
MONO ABS: 1.7 10*3/uL — AB (ref 0.1–1.0)
MONOS PCT: 13 % — AB (ref 3–12)
NEUTROS ABS: 9.9 10*3/uL — AB (ref 1.7–7.7)
NEUTROS PCT: 73 % (ref 43–77)
PLATELETS: 331 10*3/uL (ref 150–400)
RBC: 3.87 MIL/uL (ref 3.87–5.11)
RDW: 14.3 % (ref 11.5–15.5)
WBC: 13.6 10*3/uL — AB (ref 4.0–10.5)

## 2014-05-21 LAB — COMPREHENSIVE METABOLIC PANEL
ALK PHOS: 78 U/L (ref 39–117)
ALT: 20 U/L (ref 0–35)
AST: 24 U/L (ref 0–37)
Albumin: 2.8 g/dL — ABNORMAL LOW (ref 3.5–5.2)
Anion gap: 10 (ref 5–15)
BUN: 13 mg/dL (ref 6–23)
CO2: 27 mmol/L (ref 19–32)
Calcium: 8.7 mg/dL (ref 8.4–10.5)
Chloride: 94 mmol/L — ABNORMAL LOW (ref 96–112)
Creatinine, Ser: 1.16 mg/dL — ABNORMAL HIGH (ref 0.50–1.10)
GFR calc non Af Amer: 47 mL/min — ABNORMAL LOW (ref >90)
GFR, EST AFRICAN AMERICAN: 55 mL/min — AB (ref >90)
Glucose, Bld: 376 mg/dL — ABNORMAL HIGH (ref 70–99)
Potassium: 4.2 mmol/L (ref 3.5–5.1)
Sodium: 131 mmol/L — ABNORMAL LOW (ref 135–145)
Total Bilirubin: 0.5 mg/dL (ref 0.3–1.2)
Total Protein: 7.3 g/dL (ref 6.0–8.3)

## 2014-05-21 LAB — I-STAT CG4 LACTIC ACID, ED: Lactic Acid, Venous: 1.96 mmol/L (ref 0.5–2.0)

## 2014-05-21 MED ORDER — VANCOMYCIN HCL 10 G IV SOLR
1500.0000 mg | INTRAVENOUS | Status: DC
  Administered 2014-05-22: 1500 mg via INTRAVENOUS
  Filled 2014-05-21 (×2): qty 1500

## 2014-05-21 MED ORDER — SODIUM CHLORIDE 0.9 % IV SOLN
INTRAVENOUS | Status: DC
  Administered 2014-05-22 (×2): via INTRAVENOUS
  Administered 2014-05-22: 50 mL/h via INTRAVENOUS
  Administered 2014-05-24: 17:00:00 via INTRAVENOUS

## 2014-05-21 MED ORDER — INSULIN ASPART 100 UNIT/ML ~~LOC~~ SOLN: 220.0000 [IU] | Freq: Every day | SUBCUTANEOUS | Status: DC

## 2014-05-21 MED ORDER — VANCOMYCIN HCL 10 G IV SOLR
2000.0000 mg | Freq: Once | INTRAVENOUS | Status: AC
  Administered 2014-05-22: 2000 mg via INTRAVENOUS
  Filled 2014-05-21: qty 2000

## 2014-05-21 MED ORDER — MORPHINE SULFATE 2 MG/ML IJ SOLN: 1.0000 mg | INTRAMUSCULAR | Status: DC | PRN

## 2014-05-21 MED ORDER — ONDANSETRON HCL 4 MG PO TABS
4.0000 mg | ORAL_TABLET | Freq: Four times a day (QID) | ORAL | Status: DC | PRN
Start: 2014-05-21 — End: 2014-05-25

## 2014-05-21 MED ORDER — LOSARTAN POTASSIUM 50 MG PO TABS
50.0000 mg | ORAL_TABLET | Freq: Every day | ORAL | Status: DC
  Administered 2014-05-22 – 2014-05-25 (×4): 50 mg via ORAL
  Filled 2014-05-21 (×4): qty 1

## 2014-05-21 MED ORDER — OXYCODONE HCL 5 MG PO TABS
5.0000 mg | ORAL_TABLET | ORAL | Status: DC | PRN
  Administered 2014-05-22 – 2014-05-25 (×2): 5 mg via ORAL
  Filled 2014-05-21 (×2): qty 1

## 2014-05-21 MED ORDER — ONDANSETRON HCL 4 MG/2ML IJ SOLN: 4.0000 mg | Freq: Four times a day (QID) | INTRAMUSCULAR | Status: DC | PRN

## 2014-05-21 MED ORDER — DEXTROSE 5 % IV SOLN
1.0000 g | Freq: Three times a day (TID) | INTRAVENOUS | Status: DC
  Administered 2014-05-22 – 2014-05-25 (×12): 1 g via INTRAVENOUS
  Filled 2014-05-21 (×14): qty 1

## 2014-05-21 MED ORDER — ALUM & MAG HYDROXIDE-SIMETH 200-200-20 MG/5ML PO SUSP: 30.0000 mL | Freq: Four times a day (QID) | ORAL | Status: DC | PRN

## 2014-05-21 MED ORDER — VITAMIN B-1 100 MG PO TABS
100.0000 mg | ORAL_TABLET | Freq: Every day | ORAL | Status: DC
  Administered 2014-05-22 – 2014-05-25 (×4): 100 mg via ORAL
  Filled 2014-05-21 (×4): qty 1

## 2014-05-21 MED ORDER — ADULT MULTIVITAMIN W/MINERALS CH
1.0000 | ORAL_TABLET | Freq: Every day | ORAL | Status: DC
  Administered 2014-05-22 – 2014-05-25 (×4): 1 via ORAL
  Filled 2014-05-21 (×4): qty 1

## 2014-05-21 MED ORDER — ACETAMINOPHEN 650 MG RE SUPP: 650.0000 mg | Freq: Four times a day (QID) | RECTAL | Status: DC | PRN

## 2014-05-21 MED ORDER — FOLIC ACID 1 MG PO TABS
1.0000 mg | ORAL_TABLET | Freq: Every day | ORAL | Status: DC
  Administered 2014-05-22 – 2014-05-25 (×4): 1 mg via ORAL
  Filled 2014-05-21 (×4): qty 1

## 2014-05-21 MED ORDER — DEXTROSE 5 % IV SOLN
2.0000 g | Freq: Once | INTRAVENOUS | Status: AC
  Administered 2014-05-21: 2 g via INTRAVENOUS
  Filled 2014-05-21: qty 2000

## 2014-05-21 MED ORDER — ACETAMINOPHEN 325 MG PO TABS: 650.0000 mg | ORAL_TABLET | Freq: Four times a day (QID) | ORAL | Status: DC | PRN

## 2014-05-21 MED ORDER — HEPARIN SODIUM (PORCINE) 5000 UNIT/ML IJ SOLN: 5000.0000 [IU] | Freq: Three times a day (TID) | INTRAMUSCULAR | Status: DC

## 2014-05-21 MED ORDER — LISINOPRIL 10 MG PO TABS
10.0000 mg | ORAL_TABLET | Freq: Every day | ORAL | Status: DC
  Administered 2014-05-22 – 2014-05-25 (×4): 10 mg via ORAL
  Filled 2014-05-21 (×4): qty 1

## 2014-05-21 MED ORDER — AMITRIPTYLINE HCL 25 MG PO TABS
25.0000 mg | ORAL_TABLET | Freq: Every day | ORAL | Status: DC
  Administered 2014-05-21 – 2014-05-25 (×5): 25 mg via ORAL
  Filled 2014-05-21 (×5): qty 1

## 2014-05-21 MED ORDER — METFORMIN HCL ER 500 MG PO TB24
1000.0000 mg | ORAL_TABLET | Freq: Two times a day (BID) | ORAL | Status: DC
  Filled 2014-05-21: qty 2

## 2014-05-21 MED ORDER — SODIUM CHLORIDE 0.9 % IV SOLN
INTRAVENOUS | Status: DC
  Administered 2014-05-21: 125 mL/h via INTRAVENOUS
  Administered 2014-05-22 (×2): via INTRAVENOUS

## 2014-05-21 MED ORDER — CLOPIDOGREL BISULFATE 75 MG PO TABS
75.0000 mg | ORAL_TABLET | Freq: Every day | ORAL | Status: DC
  Administered 2014-05-22 – 2014-05-25 (×4): 75 mg via ORAL
  Filled 2014-05-21 (×4): qty 1

## 2014-05-21 NOTE — ED Notes (Signed)
MD at bedside. 

## 2014-05-21 NOTE — ED Notes (Signed)
pharmacy called to prepare Nafcillin

## 2014-05-21 NOTE — ED Provider Notes (Signed)
CSN: [id number]     Arrival date & time 05/21/14  1711 History   First MD Initiated Contact with Patient 05/21/14 1851     Chief Complaint  Patient presents with  . Wound Infection      HPI Pt here with wound to left foot that is possibly infected; pt noted to be tachy and sts thinks may have had fever at times Past Medical History  Diagnosis Date  . Diabetes   . Stroke 2008    left weakness  . Hemorrhoid    Past Surgical History  Procedure Laterality Date  . Colonoscopy  2004  . Cataract extraction     History reviewed. No pertinent family history. History  Substance Use Topics  . Smoking status: Never Smoker   . Smokeless tobacco: Not on file  . Alcohol Use: No   OB History    No data available     Review of Systems  All other systems reviewed and are negative  Allergies  Review of patient's allergies indicates no known allergies.  Home Medications   Prior to Admission medications   Medication Sig Start Date End Date Taking? Authorizing Provider  amitriptyline (ELAVIL) 25 MG tablet Take 25 mg by mouth at bedtime.   Yes Historical Provider, MD  clopidogrel (PLAVIX) 75 MG tablet Take 75 mg by mouth daily.  08/28/13  Yes Historical Provider, MD  insulin aspart protamine - aspart (NOVOLOG 70/30 MIX) (70-30) 100 UNIT/ML FlexPen Inject 110 Units into the skin 2 (two) times daily with a meal.   Yes Historical Provider, MD  lisinopril (PRINIVIL,ZESTRIL) 10 MG tablet Take 10 mg by mouth daily.  08/28/13  Yes Historical Provider, MD  losartan (COZAAR) 50 MG tablet Take 50 mg by mouth daily.  07/10/13  Yes Historical Provider, MD  metFORMIN (GLUCOPHAGE-XR) 500 MG 24 hr tablet Take 1,000 mg by mouth 2 (two) times daily.  05/29/13  Yes Historical Provider, MD  polyethylene glycol (MIRALAX / GLYCOLAX) packet Take 17 g by mouth daily.   Yes Historical Provider, MD  polyethylene glycol powder (GLYCOLAX/MIRALAX) powder Take 255 g (1 Container total) by mouth once. 11/15/13  Yes  Seeplaputhur Wynona LunaG Sankar, MD  cephALEXin (KEFLEX) 500 MG capsule Take 1 capsule (500 mg total) by mouth 3 (three) times daily. Patient not taking: Reported on 05/17/2014 01/30/14   Ovid CurdMatthew Wagoner, DPM  ciprofloxacin (CIPRO) 500 MG tablet Take 1 tablet (500 mg total) by mouth 2 (two) times daily. Patient not taking: Reported on 05/21/2014 05/17/14   Ovid CurdMatthew Wagoner, DPM  clindamycin (CLEOCIN) 150 MG capsule Take 1 capsule (150 mg total) by mouth 3 (three) times daily. Patient not taking: Reported on 05/21/2014 05/18/14   Ovid CurdMatthew Wagoner, DPM  collagenase (SANTYL) ointment Apply 1 application topically daily. Patient not taking: Reported on 05/17/2014 01/30/14   Ovid CurdMatthew Wagoner, DPM  polyethylene glycol powder (GLYCOLAX/MIRALAX) powder 255 grams one bottle for colonoscopy prep 11/15/13   Seeplaputhur Wynona LunaG Sankar, MD   BP 157/74 mmHg  Pulse 103  Temp(Src) 99.3 F (37.4 C) (Oral)  Resp 18  Ht 5' 6.14" (1.68 m)  Wt 260 lb (117.935 kg)  BMI 41.79 kg/m2  SpO2 94% Physical Exam  Constitutional: She is oriented to person, place, and time. She appears well-developed and well-nourished. No distress.  HENT:  Head: Normocephalic and atraumatic.  Eyes: Pupils are equal, round, and reactive to light.  Neck: Normal range of motion.  Cardiovascular: Normal rate and intact distal pulses.   Pulmonary/Chest: No respiratory distress.  Abdominal:  Normal appearance. She exhibits no distension.  Musculoskeletal: Normal range of motion. She exhibits edema.       Right shoulder: She exhibits swelling and pain.       Left foot: There is swelling.       Feet:  Erythema dorsum left foot extending somewhat up the left leg.  Neurological: She is alert and oriented to person, place, and time. No cranial nerve deficit.  Skin: Skin is warm and dry. No rash noted.  Psychiatric: She has a normal mood and affect. Her behavior is normal.  Nursing note and vitals reviewed.   ED Course  Procedures (including critical care  time) Medications                                                                nafcillin 2 g in dextrose 5 % 50 mL IVPB (2 g Intravenous Given 05/21/14 2056)  vancomycin (VANCOCIN) 2,000 mg in sodium chloride 0.9 % 500 mL IVPB (2,000 mg Intravenous Given 05/22/14 0239)    Labs Review Labs Reviewed  SURGICAL PCR SCREEN - Abnormal; Notable for the following:    Staphylococcus aureus POSITIVE (*)    All other components within normal limits  COMPREHENSIVE METABOLIC PANEL - Abnormal; Notable for the following:    Sodium 131 (*)    Chloride 94 (*)    Glucose, Bld 376 (*)    Creatinine, Ser 1.16 (*)    Albumin 2.8 (*)    GFR calc non Af Amer 47 (*)    GFR calc Af Amer 55 (*)    All other components within normal limits  CBC WITH DIFFERENTIAL/PLATELET - Abnormal; Notable for the following:    WBC 13.6 (*)    Hemoglobin 11.5 (*)    HCT 35.9 (*)    Neutro Abs 9.9 (*)    Monocytes Relative 13 (*)    Monocytes Absolute 1.7 (*)    All other components within normal limits  HEMOGLOBIN A1C - Abnormal; Notable for the following:    Hgb A1c MFr Bld 9.1 (*)    All other components within normal limits  COMPREHENSIVE METABOLIC PANEL - Abnormal; Notable for the following:    Sodium 134 (*)    Glucose, Bld 293 (*)    Albumin 2.4 (*)    GFR calc non Af Amer 57 (*)    GFR calc Af Amer 66 (*)    All other components within normal limits  CBC - Abnormal; Notable for the following:    RBC 3.64 (*)    Hemoglobin 10.6 (*)    HCT 33.6 (*)    All other components within normal limits  SEDIMENTATION RATE - Abnormal; Notable for the following:    Sed Rate 130 (*)    All other components within normal limits  C-REACTIVE PROTEIN - Abnormal; Notable for the following:    CRP 21.4 (*)    All other components within normal limits  PREALBUMIN - Abnormal; Notable for the following:    Prealbumin 6.6 (*)    All other components within normal limits  IRON AND TIBC -  Abnormal; Notable for the following:    Iron 18 (*)    TIBC 228 (*)    Saturation Ratios 8 (*)    All other components  within normal limits  GLUCOSE, CAPILLARY - Abnormal; Notable for the following:    Glucose-Capillary 265 (*)    All other components within normal limits  GLUCOSE, CAPILLARY - Abnormal; Notable for the following:    Glucose-Capillary 227 (*)    All other components within normal limits  CBC - Abnormal; Notable for the following:    WBC 11.7 (*)    RBC 3.45 (*)    Hemoglobin 10.0 (*)    HCT 32.5 (*)    All other components within normal limits  BASIC METABOLIC PANEL - Abnormal; Notable for the following:    Sodium 134 (*)    Glucose, Bld 248 (*)    Calcium 7.8 (*)    GFR calc non Af Amer 56 (*)    GFR calc Af Amer 65 (*)    Anion gap 4 (*)    All other components within normal limits  GLUCOSE, CAPILLARY - Abnormal; Notable for the following:    Glucose-Capillary 268 (*)    All other components within normal limits  GLUCOSE, CAPILLARY - Abnormal; Notable for the following:    Glucose-Capillary 219 (*)    All other components within normal limits  GLUCOSE, CAPILLARY - Abnormal; Notable for the following:    Glucose-Capillary 372 (*)    All other components within normal limits  GLUCOSE, CAPILLARY - Abnormal; Notable for the following:    Glucose-Capillary 108 (*)    All other components within normal limits  CULTURE, BLOOD (ROUTINE X 2)  CULTURE, BLOOD (ROUTINE X 2)  TSH  FERRITIN  GLUCOSE, CAPILLARY  GLUCOSE, CAPILLARY  GLUCOSE, CAPILLARY  CBC  BASIC METABOLIC PANEL WITH GFR  CBC WITH DIFFERENTIAL/PLATELET  I-STAT CG4 LACTIC ACID, ED    Imaging Review CLINICAL DATA: Wound infection with pain along the left foot. Diabetes.  EXAM: LEFT FOOT - COMPLETE 3+ VIEW  COMPARISON: 05/08/2014  FINDINGS: Abnormal gas tracks in the soft tissues of the lateral left foot adjacent to the fifth MTP joint and is the distal half of the fifth metatarsal.  Possible ulceration overlying the fifth MTP joint, which appears slightly widened compared hilum. Previously. There is some periosteal reaction along the fifth metatarsal which is new compared to the prior exam. No overt bony destructive findings.  Large plantar and Achilles calcaneal spurs noted. Dorsal midfoot spurring is present along with dorsal spurring at the Lisfranc joint.  IMPRESSION: 1. Abnormal gas in the soft tissues adjacent to the fifth MTP joint and the fifth metatarsal, with low-level periostitis/periosteal reaction in the fifth metatarsal which could be an indicator of early osteomyelitis. However, overt bony destructive findings are not observed. 2. Calcaneal spurring and dorsal midfoot spurring. 3. The fifth MTP joint also appears wider than it did previously - joint effusion and septic joint not excluded although definite gas is not observed within the joint itself.   Electronically Signed By: Gaylyn Rong M.D. On: 05/21/2014 18:25  Discussed with ortho and admitted to hospitalist.   MDM   Final diagnoses:  Foot ulcer, left, with necrosis of bone        Nelia Shi, MD 05/23/14 1950

## 2014-05-21 NOTE — ED Notes (Signed)
Pt here with wound to left foot that is possibly infected; pt noted to be tachy and sts thinks may have had fever at times

## 2014-05-21 NOTE — ED Notes (Signed)
[facility] to [facility]

## 2014-05-21 NOTE — Telephone Encounter (Signed)
Wound care called regarding Molly [name], stating that patient is on way to [facility] , pus , cellulitis ,ulcer probable to bone, needing mri, probably incision and drainage of wound

## 2014-05-21 NOTE — Progress Notes (Signed)
ANTIBIOTIC CONSULT NOTE - INITIAL  Pharmacy Consult for Vancomycin and Aztreonam Indication: diabetic foot wound  No Known Allergies  Patient Measurements: Height: 5' 6.14" (168 cm) Weight: 260 lb (117.935 kg) IBW/kg (Calculated) : 59.63 Adjusted Body Weight: 85 kg  Vital Signs: Temp: 98.8 F (37.1 C) (02/22 2225) Temp Source: Oral (02/22 1739) BP: 147/86 mmHg (02/22 2225) Pulse Rate: 115 (02/22 2200) Intake/Output from previous day:   Intake/Output from this shift:    Labs:  Recent Labs  05/21/14 1738  WBC 13.6*  HGB 11.5*  PLT 331  CREATININE 1.16*   Estimated Creatinine Clearance: 60.7 mL/min (by C-G formula based on Cr of 1.16). No results for input(s): VANCOTROUGH, VANCOPEAK, VANCORANDOM, GENTTROUGH, GENTPEAK, GENTRANDOM, TOBRATROUGH, TOBRAPEAK, TOBRARND, AMIKACINPEAK, AMIKACINTROU, AMIKACIN in the last 72 hours.   Microbiology: No results found for this or any previous visit (from the past 720 hour(s)).  Medical History: Past Medical History  Diagnosis Date  . Diabetes   . Stroke 2008    left weakness  . Hemorrhoid     Medications:  Prescriptions prior to admission  Medication Sig Dispense Refill Last Dose  . amitriptyline (ELAVIL) 25 MG tablet Take 25 mg by mouth at bedtime.   05/20/2014  . clopidogrel (PLAVIX) 75 MG tablet Take 75 mg by mouth daily.    05/20/2014  . Insulin Aspart (NOVOLOG [place]) Inject 220 Units into the skin daily.    05/20/2014  . lisinopril (PRINIVIL,ZESTRIL) 10 MG tablet Take 10 mg by mouth daily.    05/20/2014  . losartan (COZAAR) 50 MG tablet Take 50 mg by mouth daily.    05/20/2014  . metFORMIN (GLUCOPHAGE-XR) 500 MG 24 hr tablet Take 1,000 mg by mouth 2 (two) times daily.    05/20/2014  . polyethylene glycol (MIRALAX / GLYCOLAX) packet Take 17 g by mouth daily.   05/20/2014  . polyethylene glycol powder (GLYCOLAX/MIRALAX) powder Take 255 g (1 Container total) by mouth once. 255 g 0 05/20/2014  . cephALEXin (KEFLEX) 500 MG capsule Take  1 capsule (500 mg total) by mouth 3 (three) times daily. (Patient not taking: Reported on 05/17/2014) 30 capsule 2 Not Taking  . ciprofloxacin (CIPRO) 500 MG tablet Take 1 tablet (500 mg total) by mouth 2 (two) times daily. (Patient not taking: Reported on 05/21/2014) 20 tablet 0   . clindamycin (CLEOCIN) 150 MG capsule Take 1 capsule (150 mg total) by mouth 3 (three) times daily. (Patient not taking: Reported on 05/21/2014) 30 capsule 2   . collagenase (SANTYL) ointment Apply 1 application topically daily. (Patient not taking: Reported on 05/17/2014) 90 g 0 Not Taking  . polyethylene glycol powder (GLYCOLAX/MIRALAX) powder 255 grams one bottle for colonoscopy prep 255 g 0 Taking   Assessment: 69 yo female with L foot cellulitis for empiric antibiotics  Goal of Therapy:  Vancomycin trough level 10-15 mcg/ml  Plan:  Vancomycin 2 g IV now, then 1500 mg IV once daily Aztreonam 1 g IV q8h  Quavion Boule, Gary FleetGregory Vernon 05/21/2014,11:25 PM

## 2014-05-21 NOTE — H&P (Addendum)
[facility] History and Physical  Molly HughsBrenda P Licklider ZOX:[id number] DOB: Nov 18, 1945 DOA: 05/21/2014  Referring physician: Nelva Nayobert Beaton, MD PCP: Lennart Palloque, Jodi M, MD   Chief Complaint: Left Foot Infection  HPI: Molly [name] is a 69 y.o. female presents with cellulitis of the left foot. Patient states that she has had this problem for the last several months. The patient states that she has noted over the last week that there has been a foul smell and also has n=been getting more inflamed. She went to the wound center today and was told to come to the ER to be admitted. She was told that she will likely need debridement. Patient states that she has felt low grade fever but has not actually taken the temperature. She has noted there is more swelling of the leg. She has diabetes and also has noted that her sugar has not been as well controlled. In the ED she had a xray done and this shows presence of gas in the left foot. Orthopedics has been contacted and they will plan on surgical debridement. As an outpatient she did not tolerate clindamycin   Review of Systems:  Constitutional:  No weight loss, night sweats, +Fevers, chills, fatigue.  HEENT:  No headaches No sneezing, itching, ear ache, nasal congestion, post nasal drip,  Cardio-vascular:  No chest pain, Orthopnea, PND, swelling in lower extremities, anasarca, dizziness, palpitations  GI:  No heartburn, indigestion, abdominal pain, nausea, vomiting, diarrhea, change in bowel habits, loss of appetite  Resp:  No shortness of breath with exertion or at rest. No excess mucus, no productive cough Skin:  ++rash and swelling of left foot GU:  no dysuria, change in color of urine, no urgency or frequency Musculoskeletal:  No joint pain or swelling. No decreased range of motion. No back pain.  Psych:  No change in mood or affect. No depression or anxiety. No memory loss.   Past Medical History  Diagnosis Date  . Diabetes   . Stroke  2008    left weakness  . Hemorrhoid    Past Surgical History  Procedure Laterality Date  . Colonoscopy  2004  . Cataract extraction     Social History:  reports that she has never smoked. She does not have any smokeless tobacco history on file. She reports that she does not drink alcohol. Her drug history is not on file.  No Known Allergies  History reviewed. No pertinent family history.   Prior to Admission medications   Medication Sig Start Date End Date Taking? Authorizing Provider  amitriptyline (ELAVIL) 25 MG tablet Take 25 mg by mouth at bedtime.   Yes Historical Provider, MD  clopidogrel (PLAVIX) 75 MG tablet Take 75 mg by mouth daily.  08/28/13  Yes Historical Provider, MD  Insulin Aspart (NOVOLOG [place]) Inject 220 Units into the skin daily.    Yes Historical Provider, MD  lisinopril (PRINIVIL,ZESTRIL) 10 MG tablet Take 10 mg by mouth daily.  08/28/13  Yes Historical Provider, MD  losartan (COZAAR) 50 MG tablet Take 50 mg by mouth daily.  07/10/13  Yes Historical Provider, MD  metFORMIN (GLUCOPHAGE-XR) 500 MG 24 hr tablet Take 1,000 mg by mouth 2 (two) times daily.  05/29/13  Yes Historical Provider, MD  polyethylene glycol (MIRALAX / GLYCOLAX) packet Take 17 g by mouth daily.   Yes Historical Provider, MD  polyethylene glycol powder (GLYCOLAX/MIRALAX) powder Take 255 g (1 Container total) by mouth once. 11/15/13  Yes Seeplaputhur Wynona LunaG Sankar, MD  cephALEXin [facility](KEFLEX)  500 MG capsule Take 1 capsule (500 mg total) by mouth 3 (three) times daily. Patient not taking: Reported on 05/17/2014 01/30/14   Ovid Curd, DPM  ciprofloxacin (CIPRO) 500 MG tablet Take 1 tablet (500 mg total) by mouth 2 (two) times daily. Patient not taking: Reported on 05/21/2014 05/17/14   Ovid Curd, DPM  clindamycin (CLEOCIN) 150 MG capsule Take 1 capsule (150 mg total) by mouth 3 (three) times daily. Patient not taking: Reported on 05/21/2014 05/18/14   Ovid Curd, DPM  collagenase (SANTYL) ointment Apply 1  application topically daily. Patient not taking: Reported on 05/17/2014 01/30/14   Ovid Curd, DPM  polyethylene glycol powder (GLYCOLAX/MIRALAX) powder 255 grams one bottle for colonoscopy prep 11/15/13   Kieth Brightly, MD   Physical Exam: Filed Vitals:   05/21/14 2030 05/21/14 2045 05/21/14 2100 05/21/14 2130  BP: 167/75 162/99 131/97 129/92  Pulse: 115 115 113 113  Temp:      TempSrc:      Resp: SpO2: 91% 92% 91% 91%    Wt Readings from Last 3 Encounters:  11/15/13 114.76 kg (253 lb)    General:  Appears calm and comfortable Eyes: PERRL, normal lids, irises & conjunctiva ENT: grossly normal hearing, lips & tongue Neck: no LAD, masses or thyromegaly Cardiovascular: RRR, no m/r/g. No LE edema. Respiratory: CTA bilaterally, no w/r/r. Normal respiratory effort. Abdomen: soft, ntnd Skin: no rash or induration seen on limited exam Musculoskeletal: grossly normal tone BUE/BLE Psychiatric: grossly normal mood and affect, speech fluent and appropriate Neurologic: grossly non-focal.          Labs on Admission:  Basic Metabolic Panel:  Recent Labs Lab 05/21/14 1738  NA 131*  K 4.2  CL 94*  CO2 27  GLUCOSE 376*  BUN 13  CREATININE 1.16*  CALCIUM 8.7   Liver Function Tests:  Recent Labs Lab 05/21/14 1738  AST 24  ALT 20  ALKPHOS 78  BILITOT 0.5  PROT 7.3  ALBUMIN 2.8*   No results for input(s): LIPASE, AMYLASE in the last 168 hours. No results for input(s): AMMONIA in the last 168 hours. CBC:  Recent Labs Lab 05/21/14 1738  WBC 13.6*  NEUTROABS 9.9*  HGB 11.5*  HCT 35.9*  MCV 92.8  PLT 331   Cardiac Enzymes: No results for input(s): CKTOTAL, CKMB, CKMBINDEX, TROPONINI in the last 168 hours.  BNP (last 3 results) No results for input(s): BNP in the last 8760 hours.  ProBNP (last 3 results) No results for input(s): PROBNP in the last 8760 hours.  CBG: No results for input(s): GLUCAP in the last 168 hours.  Radiological  Exams on Admission: Dg Foot Complete Left  05/21/2014   CLINICAL DATA:  Wound infection with pain along the left foot. Diabetes.  EXAM: LEFT FOOT - COMPLETE 3+ VIEW  COMPARISON:  05/08/2014  FINDINGS: Abnormal gas tracks in the soft tissues of the lateral left foot adjacent to the fifth MTP joint and is the distal half of the fifth metatarsal. Possible ulceration overlying the fifth MTP joint, which appears slightly widened compared hilum. Previously. There is some periosteal reaction along the fifth metatarsal which is new compared to the prior exam. No overt bony destructive findings.  Large plantar and Achilles calcaneal spurs noted. Dorsal midfoot spurring is present along with dorsal spurring at the Lisfranc joint.  IMPRESSION: 1. Abnormal gas in the soft tissues adjacent to the fifth MTP joint and the fifth metatarsal, with low-level periostitis/periosteal reaction in  the fifth metatarsal which could be an indicator of early osteomyelitis. However, overt bony destructive findings are not observed. 2. Calcaneal spurring and dorsal midfoot spurring. 3. The fifth MTP joint also appears wider than it did previously - joint effusion and septic joint not excluded although definite gas is not observed within the joint itself.   Electronically Signed   By: Gaylyn Rong M.D.   On: 05/21/2014 18:25    Assessment/Plan Principal Problem:   Cellulitis of left foot Active Problems:   Diabetes mellitus   Chronic anemia   Osteomyelitis of left foot   CKD (chronic kidney disease) stage 3, GFR 30-59 ml/min   1. Cellulitis of left foot -not improving with outpatient therapy -will admit for IV antibiotics -will have wound consult -probable osteomyelitis underlying ortho to see in am  2. Diabetes Mellitus Uncontrolled -will continue with home medications -monitor FSBS -check A1C -will place on sliding scale  3. Chronic Anemia -will check iron studies  4. CKD Stage 3 -will hydrate  gently   Code Status: Full Code (must indicate code status--if unknown or must be presumed, indicate so) DVT Prophylaxis:Heparin Family Communication: Husband (indicate person spoken with, if applicable, with phone number if by telephone) Disposition Plan: Home (indicate anticipated LOS)  Time spent:  [facility] Pager [phone]

## 2014-05-21 NOTE — ED Notes (Signed)
Orthopedic Consult at bedside.

## 2014-05-21 NOTE — Telephone Encounter (Signed)
Molly HatchetSheila had informed Molly StanleyLisa that patient needed an authorization for Wound Care referral.  "Patient has an appointment at 2:15pm.  Dr. Meyer RusselBritto NPI is [id number].  Tax ID is [id number].  CPT Codes: [id number], [id number] and [phone]  I called [employer] and [facility].  She stated authorization is not needed for this facility.  Reference number is [id number].  I called and informed Molly HatchetSheila and gave her the reference number.

## 2014-05-22 ENCOUNTER — Inpatient Hospital Stay (HOSPITAL_COMMUNITY): Payer: Medicare HMO | Admitting: Anesthesiology

## 2014-05-22 ENCOUNTER — Encounter (HOSPITAL_COMMUNITY): Payer: Self-pay | Admitting: Certified Registered Nurse Anesthetist

## 2014-05-22 ENCOUNTER — Encounter (HOSPITAL_COMMUNITY)
Admission: EM | Disposition: A | Discharge status: Skilled Nursing Facility | Payer: Self-pay | Source: Home / Self Care | Attending: Internal Medicine

## 2014-05-22 DIAGNOSIS — E1165 Type 2 diabetes mellitus with hyperglycemia: Secondary | ICD-10-CM

## 2014-05-22 DIAGNOSIS — L97524 Non-pressure chronic ulcer of other part of left foot with necrosis of bone: Secondary | ICD-10-CM

## 2014-05-22 DIAGNOSIS — M869 Osteomyelitis, unspecified: Secondary | ICD-10-CM

## 2014-05-22 DIAGNOSIS — E118 Type 2 diabetes mellitus with unspecified complications: Secondary | ICD-10-CM

## 2014-05-22 HISTORY — PX: AMPUTATION: SHX166

## 2014-05-22 LAB — TSH: TSH: 0.603 u[IU]/mL (ref 0.350–4.500)

## 2014-05-22 LAB — COMPREHENSIVE METABOLIC PANEL
ALK PHOS: 66 U/L (ref 39–117)
ALT: 18 U/L (ref 0–35)
ANION GAP: 6 (ref 5–15)
AST: 17 U/L (ref 0–37)
Albumin: 2.4 g/dL — ABNORMAL LOW (ref 3.5–5.2)
BUN: 13 mg/dL (ref 6–23)
CHLORIDE: 98 mmol/L (ref 96–112)
CO2: 30 mmol/L (ref 19–32)
Calcium: 8.4 mg/dL (ref 8.4–10.5)
Creatinine, Ser: 0.99 mg/dL (ref 0.50–1.10)
GFR calc Af Amer: 66 mL/min — ABNORMAL LOW (ref >90)
GFR, EST NON AFRICAN AMERICAN: 57 mL/min — AB (ref >90)
Glucose, Bld: 293 mg/dL — ABNORMAL HIGH (ref 70–99)
Potassium: 4 mmol/L (ref 3.5–5.1)
Sodium: 134 mmol/L — ABNORMAL LOW (ref 135–145)
TOTAL PROTEIN: 6.6 g/dL (ref 6.0–8.3)
Total Bilirubin: 0.6 mg/dL (ref 0.3–1.2)

## 2014-05-22 LAB — GLUCOSE, CAPILLARY
GLUCOSE-CAPILLARY: 93 mg/dL (ref 70–99)
Glucose-Capillary: 265 mg/dL — ABNORMAL HIGH (ref 70–99)
Glucose-Capillary: 227 mg/dL — ABNORMAL HIGH (ref 70–99)
Glucose-Capillary: 94 mg/dL (ref 70–99)
Glucose-Capillary: 83 mg/dL (ref 70–99)
Glucose-Capillary: 268 mg/dL — ABNORMAL HIGH (ref 70–99)

## 2014-05-22 LAB — SEDIMENTATION RATE: Sed Rate: 130 mm/hr — ABNORMAL HIGH (ref 0–22)

## 2014-05-22 LAB — IRON AND TIBC
IRON: 18 ug/dL — AB (ref 42–145)
Saturation Ratios: 8 % — ABNORMAL LOW (ref 20–55)
TIBC: 228 ug/dL — ABNORMAL LOW (ref 250–470)
UIBC: 210 ug/dL (ref 125–400)

## 2014-05-22 LAB — FERRITIN: Ferritin: 219 ng/mL (ref 10–291)

## 2014-05-22 LAB — CBC
HEMATOCRIT: 33.6 % — AB (ref 36.0–46.0)
HEMOGLOBIN: 10.6 g/dL — AB (ref 12.0–15.0)
MCH: 29.1 pg (ref 26.0–34.0)
MCHC: 31.5 g/dL (ref 30.0–36.0)
MCV: 92.3 fL (ref 78.0–100.0)
Platelets: 304 10*3/uL (ref 150–400)
RBC: 3.64 MIL/uL — AB (ref 3.87–5.11)
RDW: 14.5 % (ref 11.5–15.5)
WBC: 9.4 10*3/uL (ref 4.0–10.5)

## 2014-05-22 LAB — SURGICAL PCR SCREEN
MRSA, PCR: NEGATIVE
Staphylococcus aureus: POSITIVE — AB

## 2014-05-22 LAB — C-REACTIVE PROTEIN: CRP: 21.4 mg/dL — ABNORMAL HIGH (ref <0.60)

## 2014-05-22 LAB — PREALBUMIN: PREALBUMIN: 6.6 mg/dL — AB (ref 17.0–34.0)

## 2014-05-22 SURGERY — AMPUTATION, FOOT, RAY
Anesthesia: General | Site: Foot | Laterality: Left

## 2014-05-22 MED ORDER — FENTANYL CITRATE 0.05 MG/ML IJ SOLN
INTRAMUSCULAR | Status: AC
  Filled 2014-05-22: qty 5

## 2014-05-22 MED ORDER — INSULIN ASPART PROT & ASPART (70-30 MIX) 100 UNIT/ML ~~LOC~~ SUSP
110.0000 [IU] | Freq: Two times a day (BID) | SUBCUTANEOUS | Status: DC
  Administered 2014-05-22 – 2014-05-23 (×2): 110 [IU] via SUBCUTANEOUS
  Filled 2014-05-22: qty 10

## 2014-05-22 MED ORDER — LIDOCAINE HCL (CARDIAC) 20 MG/ML IV SOLN
INTRAVENOUS | Status: AC
  Filled 2014-05-22: qty 5

## 2014-05-22 MED ORDER — METOCLOPRAMIDE HCL 10 MG PO TABS: 5.0000 mg | ORAL_TABLET | Freq: Three times a day (TID) | ORAL | Status: DC | PRN

## 2014-05-22 MED ORDER — INSULIN ASPART 100 UNIT/ML ~~LOC~~ SOLN
0.0000 [IU] | Freq: Every day | SUBCUTANEOUS | Status: DC
  Administered 2014-05-22: 3 [IU] via SUBCUTANEOUS

## 2014-05-22 MED ORDER — FENTANYL CITRATE 0.05 MG/ML IJ SOLN: 25.0000 ug | INTRAMUSCULAR | Status: DC | PRN

## 2014-05-22 MED ORDER — ONDANSETRON HCL 4 MG/2ML IJ SOLN
INTRAMUSCULAR | Status: AC
  Filled 2014-05-22: qty 2

## 2014-05-22 MED ORDER — LIVING WELL WITH DIABETES BOOK
Freq: Once | Status: DC
  Filled 2014-05-22: qty 1

## 2014-05-22 MED ORDER — PROMETHAZINE HCL 25 MG/ML IJ SOLN
6.2500 mg | INTRAMUSCULAR | Status: DC | PRN
Start: 2014-05-22 — End: 2014-05-22

## 2014-05-22 MED ORDER — CHLORHEXIDINE GLUCONATE 4 % EX LIQD
60.0000 mL | Freq: Once | CUTANEOUS | Status: DC
  Filled 2014-05-22: qty 60

## 2014-05-22 MED ORDER — MIDAZOLAM HCL 2 MG/2ML IJ SOLN
INTRAMUSCULAR | Status: AC
  Filled 2014-05-22: qty 2

## 2014-05-22 MED ORDER — SCOPOLAMINE 1 MG/3DAYS TD PT72
MEDICATED_PATCH | TRANSDERMAL | Status: AC
Start: 2014-05-22 — End: 2014-05-25
  Administered 2014-05-22: 1.5 mg via TRANSDERMAL
  Filled 2014-05-22: qty 1

## 2014-05-22 MED ORDER — MIDAZOLAM HCL 5 MG/5ML IJ SOLN
INTRAMUSCULAR | Status: DC | PRN
  Administered 2014-05-22 (×2): 0.5 mg via INTRAVENOUS

## 2014-05-22 MED ORDER — INSULIN ASPART 100 UNIT/ML ~~LOC~~ SOLN
6.0000 [IU] | Freq: Three times a day (TID) | SUBCUTANEOUS | Status: DC
  Administered 2014-05-23 (×2): 6 [IU] via SUBCUTANEOUS

## 2014-05-22 MED ORDER — FENTANYL CITRATE 0.05 MG/ML IJ SOLN
INTRAMUSCULAR | Status: DC | PRN
  Administered 2014-05-22: 25 ug via INTRAVENOUS
  Administered 2014-05-22: 50 ug via INTRAVENOUS

## 2014-05-22 MED ORDER — ARTIFICIAL TEARS OP OINT
TOPICAL_OINTMENT | OPHTHALMIC | Status: AC
  Filled 2014-05-22: qty 3.5

## 2014-05-22 MED ORDER — SODIUM CHLORIDE 0.9 % IV SOLN: INTRAVENOUS | Status: DC

## 2014-05-22 MED ORDER — PNEUMOCOCCAL VAC POLYVALENT 25 MCG/0.5ML IJ INJ
0.5000 mL | INJECTION | INTRAMUSCULAR | Status: DC
  Filled 2014-05-22 (×2): qty 0.5

## 2014-05-22 MED ORDER — MEPERIDINE HCL 25 MG/ML IJ SOLN: 6.2500 mg | INTRAMUSCULAR | Status: DC | PRN

## 2014-05-22 MED ORDER — SODIUM BICARBONATE 8.4 % IV SOLN
INTRAVENOUS | Status: AC
  Filled 2014-05-22: qty 50

## 2014-05-22 MED ORDER — ROCURONIUM BROMIDE 50 MG/5ML IV SOLN
INTRAVENOUS | Status: AC
  Filled 2014-05-22: qty 1

## 2014-05-22 MED ORDER — ALBUTEROL SULFATE HFA 108 (90 BASE) MCG/ACT IN AERS
INHALATION_SPRAY | RESPIRATORY_TRACT | Status: AC
  Filled 2014-05-22: qty 6.7

## 2014-05-22 MED ORDER — 0.9 % SODIUM CHLORIDE (POUR BTL) OPTIME
TOPICAL | Status: DC | PRN
  Administered 2014-05-22: 1000 mL

## 2014-05-22 MED ORDER — LIDOCAINE HCL (CARDIAC) 20 MG/ML IV SOLN
INTRAVENOUS | Status: DC | PRN
  Administered 2014-05-22: 20 mg via INTRAVENOUS

## 2014-05-22 MED ORDER — METOCLOPRAMIDE HCL 5 MG/ML IJ SOLN: 5.0000 mg | Freq: Three times a day (TID) | INTRAMUSCULAR | Status: DC | PRN

## 2014-05-22 MED ORDER — ALBUTEROL SULFATE HFA 108 (90 BASE) MCG/ACT IN AERS
INHALATION_SPRAY | RESPIRATORY_TRACT | Status: DC | PRN
  Administered 2014-05-22: 4 via RESPIRATORY_TRACT
  Administered 2014-05-22 (×2): 2 via RESPIRATORY_TRACT

## 2014-05-22 MED ORDER — INSULIN ASPART 100 UNIT/ML ~~LOC~~ SOLN
0.0000 [IU] | Freq: Three times a day (TID) | SUBCUTANEOUS | Status: DC
  Administered 2014-05-22: 5 [IU] via SUBCUTANEOUS
  Administered 2014-05-23: 20 [IU] via SUBCUTANEOUS
  Administered 2014-05-23: 7 [IU] via SUBCUTANEOUS
  Administered 2014-05-24: 11 [IU] via SUBCUTANEOUS
  Administered 2014-05-24: 7 [IU] via SUBCUTANEOUS
  Administered 2014-05-25: 3 [IU] via SUBCUTANEOUS
  Administered 2014-05-25: 7 [IU] via SUBCUTANEOUS

## 2014-05-22 MED ORDER — ONDANSETRON HCL 4 MG/2ML IJ SOLN
INTRAMUSCULAR | Status: DC | PRN
  Administered 2014-05-22: 4 mg via INTRAVENOUS

## 2014-05-22 MED ORDER — GLUCERNA SHAKE PO LIQD
237.0000 mL | Freq: Two times a day (BID) | ORAL | Status: DC
  Administered 2014-05-24 (×2): 237 mL via ORAL

## 2014-05-22 MED ORDER — PROPOFOL 10 MG/ML IV BOLUS
INTRAVENOUS | Status: DC | PRN
  Administered 2014-05-22: 150 mg via INTRAVENOUS

## 2014-05-22 MED ORDER — MIDAZOLAM HCL 2 MG/2ML IJ SOLN
0.5000 mg | Freq: Once | INTRAMUSCULAR | Status: DC | PRN
Start: 2014-05-22 — End: 2014-05-22

## 2014-05-22 MED ORDER — PROPOFOL 10 MG/ML IV BOLUS
INTRAVENOUS | Status: AC
  Filled 2014-05-22: qty 20

## 2014-05-22 MED ORDER — SCOPOLAMINE 1 MG/3DAYS TD PT72
1.0000 | MEDICATED_PATCH | TRANSDERMAL | Status: DC
  Administered 2014-05-22: 1.5 mg via TRANSDERMAL
  Filled 2014-05-22: qty 1

## 2014-05-22 MED ORDER — ENOXAPARIN SODIUM 40 MG/0.4ML ~~LOC~~ SOLN
40.0000 mg | SUBCUTANEOUS | Status: DC
  Administered 2014-05-23 – 2014-05-25 (×3): 40 mg via SUBCUTANEOUS
  Filled 2014-05-22 (×5): qty 0.4

## 2014-05-22 SURGICAL SUPPLY — 45 items
BLADE LONG MED 31MMX9MM (MISCELLANEOUS)
BLADE LONG MED 31X9 (MISCELLANEOUS) IMPLANT
BNDG COHESIVE 4X5 TAN STRL (GAUZE/BANDAGES/DRESSINGS) ×3 IMPLANT
BNDG COHESIVE 6X5 TAN STRL LF (GAUZE/BANDAGES/DRESSINGS) ×3 IMPLANT
BNDG ESMARK 4X9 LF (GAUZE/BANDAGES/DRESSINGS) ×3 IMPLANT
CANISTER SUCT 3000ML PPV (MISCELLANEOUS) ×3 IMPLANT
CHLORAPREP W/TINT 26ML (MISCELLANEOUS) ×3 IMPLANT
CUFF TOURNIQUET SINGLE 34IN LL (TOURNIQUET CUFF) IMPLANT
CUFF TOURNIQUET SINGLE 44IN (TOURNIQUET CUFF) IMPLANT
DRAPE U-SHAPE 47X51 STRL (DRAPES) ×6 IMPLANT
DRSG ADAPTIC 3X8 NADH LF (GAUZE/BANDAGES/DRESSINGS) ×3 IMPLANT
DRSG PAD ABDOMINAL 8X10 ST (GAUZE/BANDAGES/DRESSINGS) ×3 IMPLANT
ELECT REM PT RETURN 9FT ADLT (ELECTROSURGICAL) ×3
ELECTRODE REM PT RTRN 9FT ADLT (ELECTROSURGICAL) ×1 IMPLANT
GAUZE SPONGE 4X4 12PLY STRL (GAUZE/BANDAGES/DRESSINGS) IMPLANT
GLOVE BIO SURGEON STRL SZ7 (GLOVE) IMPLANT
GLOVE BIO SURGEON STRL SZ8 (GLOVE) ×3 IMPLANT
GLOVE BIOGEL PI IND STRL 7.5 (GLOVE) IMPLANT
GLOVE BIOGEL PI IND STRL 8 (GLOVE) ×1 IMPLANT
GLOVE BIOGEL PI INDICATOR 7.5 (GLOVE)
GLOVE BIOGEL PI INDICATOR 8 (GLOVE) ×2
GOWN STRL REUS W/ TWL LRG LVL3 (GOWN DISPOSABLE) ×1 IMPLANT
GOWN STRL REUS W/ TWL XL LVL3 (GOWN DISPOSABLE) ×1 IMPLANT
GOWN STRL REUS W/TWL LRG LVL3 (GOWN DISPOSABLE) ×2
GOWN STRL REUS W/TWL XL LVL3 (GOWN DISPOSABLE) ×2
KIT BASIN OR (CUSTOM PROCEDURE TRAY) ×3 IMPLANT
KIT ROOM TURNOVER OR (KITS) ×3 IMPLANT
NS IRRIG 1000ML POUR BTL (IV SOLUTION) ×3 IMPLANT
PACK ORTHO EXTREMITY (CUSTOM PROCEDURE TRAY) ×3 IMPLANT
PAD ARMBOARD 7.5X6 YLW CONV (MISCELLANEOUS) ×3 IMPLANT
PAD CAST 4YDX4 CTTN HI CHSV (CAST SUPPLIES) ×1 IMPLANT
PADDING CAST COTTON 4X4 STRL (CAST SUPPLIES) ×2
SPECIMEN JAR SMALL (MISCELLANEOUS) ×3 IMPLANT
SPONGE GAUZE 4X4 12PLY STER LF (GAUZE/BANDAGES/DRESSINGS) ×3 IMPLANT
SPONGE LAP 18X18 X RAY DECT (DISPOSABLE) ×3 IMPLANT
STAPLER VISISTAT 35W (STAPLE) IMPLANT
STOCKINETTE IMPERVIOUS LG (DRAPES) IMPLANT
SUCTION FRAZIER TIP 10 FR DISP (SUCTIONS) ×3 IMPLANT
SUT ETHILON 2 0 PSLX (SUTURE) ×6 IMPLANT
TOWEL OR 17X24 6PK STRL BLUE (TOWEL DISPOSABLE) ×3 IMPLANT
TOWEL OR 17X26 10 PK STRL BLUE (TOWEL DISPOSABLE) ×3 IMPLANT
TUBE CONNECTING 12'X1/4 (SUCTIONS) ×1
TUBE CONNECTING 12X1/4 (SUCTIONS) ×2 IMPLANT
UNDERPAD 30X30 INCONTINENT (UNDERPADS AND DIAPERS) ×3 IMPLANT
WATER STERILE IRR 1000ML POUR (IV SOLUTION) IMPLANT

## 2014-05-22 NOTE — Brief Op Note (Signed)
05/21/2014 - 05/22/2014  5:10 PM  PATIENT:  Molly [name]  69 y.o. female  PRE-OPERATIVE DIAGNOSIS:  Left foot osteomyelitis  POST-OPERATIVE DIAGNOSIS:  same  Procedure(s):  Left foot 4th and 5th ray amputations  SURGEON:  Toni ArthursJohn Ryland Tungate, MD  ASSISTANT: n/a  ANESTHESIA:   General  EBL:  minimal   TOURNIQUET:  approx 20 min with ankle esmarch  COMPLICATIONS:  None apparent  DISPOSITION:  Extubated, awake and stable to recovery.  DICTATION ID:  [id number]  -

## 2014-05-22 NOTE — Transfer of Care (Signed)
Immediate Anesthesia Transfer of Care Note  Patient: Molly [name]  Procedure(s) Performed: Procedure(s): AMPUTATION RAY LEFT FOURTH AND FIFTH  (Left)  Patient Location: PACU  Anesthesia Type:General  Level of Consciousness: patient cooperative and responds to stimulation  Airway & Oxygen Therapy: Patient Spontanous Breathing and Patient connected to nasal cannula oxygen  Post-op Assessment: Report given to RN and Post -op Vital signs reviewed and stable  Post vital signs: Reviewed and stable  Last Vitals:  Filed Vitals:   05/22/14 1513  BP: 145/86  Pulse: 108  Temp: 36.3 C  Resp: 18    Complications: No apparent anesthesia complications

## 2014-05-22 NOTE — Anesthesia Preprocedure Evaluation (Signed)
Anesthesia Evaluation  Patient identified by MRN, date of birth, ID band Patient awake    Reviewed: Allergy & Precautions, NPO status , Patient's Chart, lab work & pertinent test results  History of Anesthesia Complications (+) PONV and history of anesthetic complications  Airway Mallampati: I  TM Distance: >3 FB Neck ROM: Full    Dental  (+) Missing, Dental Advisory Given   Pulmonary shortness of breath, asthma , COPD COPD inhaler,  breath sounds clear to auscultation        Cardiovascular hypertension, Pt. on medications - anginaRhythm:Regular Rate:Normal  '08 ECHO: normal LVF, possible R-L shunt through PFO   Neuro/Psych CVA (L weakness), Residual Symptoms    GI/Hepatic Neg liver ROS, GERD-  Medicated and Controlled,  Endo/Other  diabetes, Insulin DependentMorbid obesity  Renal/GU negative Renal ROS     Musculoskeletal   Abdominal (+) + obese,   Peds  Hematology negative hematology ROS (+)   Anesthesia Other Findings   Reproductive/Obstetrics                             Anesthesia Physical Anesthesia Plan  ASA: III  Anesthesia Plan: General   Post-op Pain Management:    Induction: Intravenous  Airway Management Planned: LMA  Additional Equipment:   Intra-op Plan:   Post-operative Plan: Extubation in OR  Informed Consent: I have reviewed the patients History and Physical, chart, labs and discussed the procedure including the risks, benefits and alternatives for the proposed anesthesia with the patient or authorized representative who has indicated his/her understanding and acceptance.   Dental advisory given  Plan Discussed with: CRNA and Surgeon  Anesthesia Plan Comments: (Plan routine monitors, GA-LMA ok)        Anesthesia Quick Evaluation

## 2014-05-22 NOTE — Consult Note (Signed)
WOC reviewed chart and spoke with bedside nursing.  Appears based on notes that ortho is planning some type of surgical debridement on the wound.  Will hold off on WOC consult at this time.   Re consult if needed, will not follow at this time. Thanks  Avalie Oconnor [employer] RN, CWOCN [phone])

## 2014-05-22 NOTE — Addendum Note (Signed)
Addendum  created 05/22/14 1840 by Rosalio Macadamiahristine T Scotland Korver, CRNA   Modules edited: Anesthesia Events, Narrator   Narrator:  Narrator: Event Log Edited

## 2014-05-22 NOTE — Progress Notes (Signed)
VASCULAR LAB PRELIMINARY  PRELIMINARY  PRELIMINARY  PRELIMINARY  ABI's  within normal limits. RT TBI (0.71) within lower limits of normal. (>0.70) LT TBI (0.37) within severe category of disease (0.30 - 0.39)  Laporshia Hogen, Aurelio BrashKathryn F, RVT 05/22/2014, 3:08 PM

## 2014-05-22 NOTE — Anesthesia Postprocedure Evaluation (Signed)
  Anesthesia Post-op Note  Patient: Molly [name]  Procedure(s) Performed: Procedure(s): AMPUTATION RAY LEFT FOURTH AND FIFTH  (Left)  Patient Location: PACU  Anesthesia Type:General  Level of Consciousness: awake, alert , oriented and patient cooperative  Airway and Oxygen Therapy: Patient Spontanous Breathing  Post-op Pain: none  Post-op Assessment: Post-op Vital signs reviewed, Patient's Cardiovascular Status Stable, Respiratory Function Stable, Patent Airway, No signs of Nausea or vomiting and Pain level controlled  Post-op Vital Signs: Reviewed and stable  Last Vitals:  Filed Vitals:   05/22/14 1804  BP: 96/46  Pulse: 102  Temp: 37 C  Resp: 18    Complications: No apparent anesthesia complications

## 2014-05-22 NOTE — Consult Note (Signed)
Reason for Consult:  Left foot ulcer Referring Physician:  Dr. Timoteo Expose Molly [name] is an 69 y.o. female.  HPI:  69 y/o female with PMH of diabetes c/o ulcer at the left foot that has been present for several months.  She reports that it has been healing well up until a week ago when it started draining and smelling badly.  She denies any pain.  She denies f/c/n/v up until a couple of days ago when she started abx.  She was admitted and started on Vanc and aztreonam.  She ate a few bites of her breakfast this morning at about 7.  Past Medical History  Diagnosis Date  . Diabetes   . Stroke 2008    left weakness  . Hemorrhoid     Past Surgical History  Procedure Laterality Date  . Colonoscopy  2004  . Cataract extraction      History reviewed. No pertinent family history.  Social History:  reports that she has never smoked. She does not have any smokeless tobacco history on file. She reports that she does not drink alcohol. Her drug history is not on file.  Allergies: No Known Allergies  Medications: I have reviewed the patient's current medications.  Results for orders placed or performed during the hospital encounter of 05/21/14 (from the past 48 hour(s))  Comprehensive metabolic panel     Status: Abnormal   Collection Time: 05/21/14  5:38 PM  Result Value Ref Range   Sodium 131 (L) 135 - 145 mmol/L   Potassium 4.2 3.5 - 5.1 mmol/L   Chloride 94 (L) 96 - 112 mmol/L   CO2 27 19 - 32 mmol/L   Glucose, Bld 376 (H) 70 - 99 mg/dL   BUN 13 6 - 23 mg/dL   Creatinine, Ser 1.16 (H) 0.50 - 1.10 mg/dL   Calcium 8.7 8.4 - 10.5 mg/dL   Total Protein 7.3 6.0 - 8.3 g/dL   Albumin 2.8 (L) 3.5 - 5.2 g/dL   AST 24 0 - 37 U/L   ALT 20 0 - 35 U/L   Alkaline Phosphatase 78 39 - 117 U/L   Total Bilirubin 0.5 0.3 - 1.2 mg/dL   GFR calc non Af Amer 47 (L) >90 mL/min   GFR calc Af Amer 55 (L) >90 mL/min    Comment: (NOTE) The eGFR has been calculated using the CKD EPI equation. This  calculation has not been validated in all clinical situations. eGFR's persistently <90 mL/min signify possible Chronic Kidney Disease.    Anion gap 10 5 - 15  CBC with Differential     Status: Abnormal   Collection Time: 05/21/14  5:38 PM  Result Value Ref Range   WBC 13.6 (H) 4.0 - 10.5 K/uL   RBC 3.87 3.87 - 5.11 MIL/uL   Hemoglobin 11.5 (L) 12.0 - 15.0 g/dL   HCT 35.9 (L) 36.0 - 46.0 %   MCV 92.8 78.0 - 100.0 fL   MCH 29.7 26.0 - 34.0 pg   MCHC 32.0 30.0 - 36.0 g/dL   RDW 14.3 11.5 - 15.5 %   Platelets 331 150 - 400 K/uL   Neutrophils Relative % 73 43 - 77 %   Neutro Abs 9.9 (H) 1.7 - 7.7 K/uL   Lymphocytes Relative 12 12 - 46 %   Lymphs Abs 1.6 0.7 - 4.0 K/uL   Monocytes Relative 13 (H) 3 - 12 %   Monocytes Absolute 1.7 (H) 0.1 - 1.0 K/uL   Eosinophils Relative  2 0 - 5 %   Eosinophils Absolute 0.3 0.0 - 0.7 K/uL   Basophils Relative 0 0 - 1 %   Basophils Absolute 0.0 0.0 - 0.1 K/uL  I-Stat CG4 Lactic Acid, ED     Status: None   Collection Time: 05/21/14  5:57 PM  Result Value Ref Range   Lactic Acid, Venous 1.96 0.5 - 2.0 mmol/L  TSH     Status: None   Collection Time: 05/21/14 11:25 PM  Result Value Ref Range   TSH 0.603 0.350 - 4.500 uIU/mL  Sedimentation rate     Status: Abnormal   Collection Time: 05/21/14 11:25 PM  Result Value Ref Range   Sed Rate 130 (H) 0 - 22 mm/hr  C-reactive protein     Status: Abnormal   Collection Time: 05/21/14 11:25 PM  Result Value Ref Range   CRP 21.4 (H) <0.60 mg/dL    Comment: Performed at [facility]  Prealbumin     Status: Abnormal   Collection Time: 05/21/14 11:25 PM  Result Value Ref Range   Prealbumin 6.6 (L) 17.0 - 34.0 mg/dL    Comment: Performed at [place] metabolic panel     Status: Abnormal   Collection Time: 05/22/14  6:14 AM  Result Value Ref Range   Sodium 134 (L) 135 - 145 mmol/L   Potassium 4.0 3.5 - 5.1 mmol/L   Chloride 98 96 - 112 mmol/L   CO2 30 19 - 32 mmol/L    Glucose, Bld 293 (H) 70 - 99 mg/dL   BUN 13 6 - 23 mg/dL   Creatinine, Ser 0.99 0.50 - 1.10 mg/dL   Calcium 8.4 8.4 - 10.5 mg/dL   Total Protein 6.6 6.0 - 8.3 g/dL   Albumin 2.4 (L) 3.5 - 5.2 g/dL   AST 17 0 - 37 U/L   ALT 18 0 - 35 U/L   Alkaline Phosphatase 66 39 - 117 U/L   Total Bilirubin 0.6 0.3 - 1.2 mg/dL   GFR calc non Af Amer 57 (L) >90 mL/min   GFR calc Af Amer 66 (L) >90 mL/min    Comment: (NOTE) The eGFR has been calculated using the CKD EPI equation. This calculation has not been validated in all clinical situations. eGFR's persistently <90 mL/min signify possible Chronic Kidney Disease.    Anion gap 6 5 - 15  CBC     Status: Abnormal   Collection Time: 05/22/14  6:14 AM  Result Value Ref Range   WBC 9.4 4.0 - 10.5 K/uL   RBC 3.64 (L) 3.87 - 5.11 MIL/uL   Hemoglobin 10.6 (L) 12.0 - 15.0 g/dL   HCT 33.6 (L) 36.0 - 46.0 %   MCV 92.3 78.0 - 100.0 fL   MCH 29.1 26.0 - 34.0 pg   MCHC 31.5 30.0 - 36.0 g/dL   RDW 14.5 11.5 - 15.5 %   Platelets 304 150 - 400 K/uL  Glucose, capillary     Status: Abnormal   Collection Time: 05/22/14  6:37 AM  Result Value Ref Range   Glucose-Capillary 265 (H) 70 - 99 mg/dL    Dg Foot Complete Left  05/21/2014   CLINICAL DATA:  Wound infection with pain along the left foot. Diabetes.  EXAM: LEFT FOOT - COMPLETE 3+ VIEW  COMPARISON:  05/08/2014  FINDINGS: Abnormal gas tracks in the soft tissues of the lateral left foot adjacent to the fifth MTP joint and is the distal half of the fifth metatarsal.  Possible ulceration overlying the fifth MTP joint, which appears slightly widened compared hilum. Previously. There is some periosteal reaction along the fifth metatarsal which is new compared to the prior exam. No overt bony destructive findings.  Large plantar and Achilles calcaneal spurs noted. Dorsal midfoot spurring is present along with dorsal spurring at the Lisfranc joint.  IMPRESSION: 1. Abnormal gas in the soft tissues adjacent to the fifth  MTP joint and the fifth metatarsal, with low-level periostitis/periosteal reaction in the fifth metatarsal which could be an indicator of early osteomyelitis. However, overt bony destructive findings are not observed. 2. Calcaneal spurring and dorsal midfoot spurring. 3. The fifth MTP joint also appears wider than it did previously - joint effusion and septic joint not excluded although definite gas is not observed within the joint itself.   Electronically Signed   By: Van Clines M.D.   On: 05/21/2014 18:25    ROS:  + n/v.  No f/c.  Drainage as above. PE:  Blood pressure 130/57, pulse 100, temperature 97.7 F (36.5 C), temperature source Oral, resp. rate 18, height 5' 6.14" (1.68 m), weight 117.935 kg (260 lb), SpO2 93 %. Obese female in nad.  A and O x 4.  Mood and affect normal.  EOMi.  Resp unlabored.  L foot with plantar ulcer at 5th MT head with exposed bone.  Serosang drainage.  Cellulitis around the soft tissues of the lateral forefoot.  No lymphadenopathy.  Brisk cap refill at the forefoot.  5/5 strength in PF and DF of the ankle and toes.  Sens to LT at the forefoot diminished.  Assessment/Plan: L 5th MT osteomyelitis - I explained the nature of osteomyelitis to the patient in detail.  Her soft tissues are extremely compromised.  I believe she'll likely need 4th and 5th ray amputations to have adequate soft tissues to close.  She understands the surgical plan and agrees.  The risks and benefits of the alternative treatment options have been discussed in detail.  The patient wishes to proceed with surgery and specifically understands risks of bleeding, infection, nerve damage, blood clots, need for additional surgery, amputation and death.   Wylene Simmer 06/04/14, 12:07 PM

## 2014-05-22 NOTE — Progress Notes (Signed)
[facility] PROGRESS NOTE  Molly HughsBrenda P Goodness ZOX:[id number] DOB: 06/06/1945 DOA: 05/21/2014 PCP: Lennart Palloque, Jodi M, MD   Brief narrative 69 year old obese female with history of uncontrolled diabetes mellitus with chronic left foot wound (left fifth metatarsal ulceration for several months) with increased wall smelling drainage for the past 1 week. She was started on oral clindamycin as outpatient without improvement. She was seen at the wound center on the day of admission and was sent to the ED. In the ED patient had an x-ray the foot done which showed presence of gas in the left foot. Orthopedic surgery was consulted patient placed on broad-spectrum IV antibiotic.   Assessment/Plan: Left fifth metatarsal osteomyelitis Likely due to uncontrolled diabetes mellitus. Seen by orthopedic consult and plan on surgery with possible fourth and fifth ray amputations. Wound care consulted. -Empiric antibiotic coverage with IV vancomycin and Zosyn. -Patient has poor sensation over the foot and not in any pain.  Uncontrolled type 2 diabetes mellitus Patient on 110 units NovoLog twice a day with sliding scale insulin which has been resumed. Check A1c. Added pre-meal aspart coverage for tight blood glucose control.. She would benefit from outpatient endocrinology follow-up. -Hold metformin while in the hospital.  Chronic anemia Check  iron studies.  Chronic kidney disease stage III Added gentle hydration.  History of stroke with residual mild left-sided weakness Hold Plavix for surgery.  HTN  on ACEi and ARB  Obesity Counseled on diet and exercise.   Code Status: Full code Family Communication: None at bedside Disposition Plan: Currently inpatient.   Consultants:  Orthopedics (Dr. Victorino DikeHewitt)  Procedures:  For OR  likely today.  Antibiotics:  IV  vancomycin and Zosyn since 2/22  HPI/Subjective: Patient seen and examined this morning. Denies any pain over the left foot.  Admission H&P reviewed.  Objective: Filed Vitals:   05/22/14 0600  BP: 130/57  Pulse: 100  Temp: 97.7 F (36.5 C)  Resp: 18    Intake/Output Summary (Last 24 hours) at 05/22/14 1016 Last data filed at 05/22/14 0842  Gross per 24 hour  Intake    460 ml  Output      0 ml  Net    460 ml   Filed Weights   05/21/14 2300  Weight: 117.935 kg (260 lb)    Exam:   General:  Elderly obese female lying in bed in no acute distress  HEENT: No pallor, moist oral mucosa, neck supple  Chest: Clear to auscultation bilaterally, no added sounds  CVS: Normal S1 and S2, no murmurs rub or gallop  GI: Soft, nondistended, nontender, all sounds present  Extremities: Warm, foul-smelling discharge from left foot with deep ulceration over the left metatarsal  CNS: Alert and oriented    Data Reviewed: Basic Metabolic Panel:  Recent Labs Lab 05/21/14 1738 05/22/14 0614  NA 131* 134*  K 4.2 4.0  CL 94* 98  CO2 27 30  GLUCOSE 376* 293*  BUN 13 13  CREATININE 1.16* 0.99  CALCIUM 8.7 8.4   Liver Function Tests:  Recent Labs Lab 05/21/14 1738 05/22/14 0614  AST 24 17  ALT 20 18  ALKPHOS 78 66  BILITOT 0.5 0.6  PROT 7.3 6.6  ALBUMIN 2.8* 2.4*   No results for input(s): LIPASE, AMYLASE in the last 168 hours. No results for input(s): AMMONIA in the last 168 hours. CBC:  Recent Labs Lab 05/21/14 1738 05/22/14 0614  WBC 13.6* 9.4  NEUTROABS 9.9*  --   HGB 11.5* 10.6*  HCT 35.9*  33.6*  MCV 92.8 92.3  PLT 331 304   Cardiac Enzymes: No results for input(s): CKTOTAL, CKMB, CKMBINDEX, TROPONINI in the last 168 hours. BNP (last 3 results) No results for input(s): BNP in the last 8760 hours.  ProBNP (last 3 results) No results for input(s): PROBNP in the last 8760 hours.  CBG:  Recent Labs Lab 05/22/14 0637  GLUCAP 265*    No results found for this or any previous visit (from the past 240 hour(s)).   Studies: Dg Foot Complete Left  05/21/2014   CLINICAL  DATA:  Wound infection with pain along the left foot. Diabetes.  EXAM: LEFT FOOT - COMPLETE 3+ VIEW  COMPARISON:  05/08/2014  FINDINGS: Abnormal gas tracks in the soft tissues of the lateral left foot adjacent to the fifth MTP joint and is the distal half of the fifth metatarsal. Possible ulceration overlying the fifth MTP joint, which appears slightly widened compared hilum. Previously. There is some periosteal reaction along the fifth metatarsal which is new compared to the prior exam. No overt bony destructive findings.  Large plantar and Achilles calcaneal spurs noted. Dorsal midfoot spurring is present along with dorsal spurring at the Lisfranc joint.  IMPRESSION: 1. Abnormal gas in the soft tissues adjacent to the fifth MTP joint and the fifth metatarsal, with low-level periostitis/periosteal reaction in the fifth metatarsal which could be an indicator of early osteomyelitis. However, overt bony destructive findings are not observed. 2. Calcaneal spurring and dorsal midfoot spurring. 3. The fifth MTP joint also appears wider than it did previously - joint effusion and septic joint not excluded although definite gas is not observed within the joint itself.   Electronically Signed   By: Gaylyn Rong M.D.   On: 05/21/2014 18:25    Scheduled Meds: . amitriptyline  25 mg Oral QHS  . aztreonam  1 g Intravenous 3 times per day  . clopidogrel  75 mg Oral Daily  . folic acid  1 mg Oral Daily  . heparin  5,000 Units Subcutaneous 3 times per day  . insulin aspart  0-20 Units Subcutaneous TID WC  . insulin aspart  0-5 Units Subcutaneous QHS  . insulin aspart  6 Units Subcutaneous TID WC  . insulin aspart protamine- aspart  110 Units Subcutaneous BID WC  . lisinopril  10 mg Oral Daily  . losartan  50 mg Oral Daily  . multivitamin with minerals  1 tablet Oral Daily  . [START ON 05/23/2014] pneumococcal 23 valent vaccine  0.5 mL Intramuscular Tomorrow-1000  . thiamine  100 mg Oral Daily  . vancomycin   1,500 mg Intravenous Q24H   Continuous Infusions: . sodium chloride Stopped (05/22/14 0028)  . sodium chloride 50 mL/hr at 05/22/14 0238      Time spent: 25 minutes    Eddie North  [facility] Pager [phone]. If 7PM-7AM, please contact night-coverage at [email or web address], password [facility] 05/22/2014, 10:16 AM  LOS: 1 day

## 2014-05-22 NOTE — Progress Notes (Signed)
Orthopedic Tech Progress Note Patient Details:  Ardis HughsBrenda P Vaquera [birth year] [id number] Delivered post-op shoe to pt.'s nurse. Patient ID: Ardis HughsBrenda P Inboden, female   DOB: [birth year], 69 y.o.   MRN: [id number]   Lesle ChrisGilliland, Tashay Bozich L 05/22/2014, 8:01 PM

## 2014-05-22 NOTE — Progress Notes (Signed)
Inpatient Diabetes Program Recommendations  AACE/ADA: New Consensus Statement on Inpatient Glycemic Control (2013)  Target Ranges:  Prepandial:   less than 140 mg/dL      Peak postprandial:   less than 180 mg/dL (1-2 hours)      Critically ill patients:  140 - 180 mg/dL   Results for Ardis HughsGARNER, Eadie P (MRN [id number]) as of 05/22/2014 15:44  Ref. Range 05/22/2014 06:37 05/22/2014 12:35 05/22/2014 15:06  Glucose-Capillary Latest Range: 70-99 mg/dL 528265 (H) 413227 (H) 94    Reason for assessment/visit: elevated CBG  Diabetes history: Type 2 Outpatient Diabetes medications: 155 units Novolog 70/30 bid (stated in pharmacy note) 110 units bid in med list, Metformin 1000mg  bid,  Current orders for Inpatient glycemic control: Novolog 70/30 110 units bid, Novolog correction scale 0-5 units qhs, Novolog correction 0-20 units tid with meals, Novolog 6 units tid with meals.  Spoke with RN this am who is doing diabetes teaching with patient at the bedside- she discussed insulin administration with the patient.  RN ordered living well with diabetes booklet for patient.  I went to see the patient this afternoon but she was down in surgery. We will continue to follow this patient while she is in hospital.   Susette RacerJulie Kensington Rios, RN, BA, MHA, CDE Diabetes Coordinator Inpatient Diabetes Program  [phone] (Team Pager) [phone] Patrcia Dolly([place] Office) 05/22/2014 3:51 PM

## 2014-05-22 NOTE — Progress Notes (Signed)
INITIAL NUTRITION ASSESSMENT  DOCUMENTATION CODES Per approved criteria  -Morbid Obesity   INTERVENTION: Provide Glucerna Shake po BID, each supplement provides 220 kcal and 10 grams of protein.  Encourage adequate PO intake.  NUTRITION DIAGNOSIS: Increased nutrient needs related to wound healing as evidenced by estimated nutrition needs.   Goal: Pt to meet >/= 90% of their estimated nutrition needs   Monitor:  PO intake, weight trends, labs, I/O's  Reason for Assessment: MD consult for wound healing  69 y.o. female  Admitting Dx: Cellulitis of left foot  ASSESSMENT: Pt presents with cellulitis of the left foot. In the ED she had a xray done and this shows presence of gas in the left foot.  Pt reports her appetite is just "ok" currently. Meal completion has been 50% as pt reports not liking the food served. She reports her husband will bringing in food for her. Pt reports PTA she was eating great with no other difficulties. Weight has been stable. Pt is agreeable to trying Glucerna to aid in wound healing. RD to order. Pt was also additionally educated on monitoring portion sizes of her food at meals that were in compliance with a diabetic diet. Pt expressed understanding.  Pt with no observed significant fat or muscle mass loss.  Labs and medications reviewed.   Height: Ht Readings from Last 1 Encounters:  05/21/14 5' 6.14" (1.68 m)    Weight: Wt Readings from Last 1 Encounters:  05/21/14 260 lb (117.935 kg)    Ideal Body Weight: 131 lbs  % Ideal Body Weight: 198%  Wt Readings from Last 10 Encounters:  05/21/14 260 lb (117.935 kg)  11/15/13 253 lb (114.76 kg)    Usual Body Weight: 260 lbs  % Usual Body Weight: 103%  BMI:  Body mass index is 41.79 kg/(m^2). Morbid obesity  Estimated Nutritional Needs: Kcal: 2100-2300 Protein: 115-130 grams Fluid: 2.1 - 2.3 L/day  Skin: Diabetic L foot ulcer  Diet Order: Diet Carb Modified  EDUCATION  NEEDS: -Education needs addressed   Intake/Output Summary (Last 24 hours) at 05/22/14 1016 Last data filed at 05/22/14 0842  Gross per 24 hour  Intake    460 ml  Output      0 ml  Net    460 ml    Last BM: 2/21  Labs:   Recent Labs Lab 05/21/14 1738 05/22/14 0614  NA 131* 134*  K 4.2 4.0  CL 94* 98  CO2 27 30  BUN 13 13  CREATININE 1.16* 0.99  CALCIUM 8.7 8.4  GLUCOSE 376* 293*    CBG (last 3)   Recent Labs  05/22/14 0637  GLUCAP 265*    Scheduled Meds: . amitriptyline  25 mg Oral QHS  . aztreonam  1 g Intravenous 3 times per day  . clopidogrel  75 mg Oral Daily  . folic acid  1 mg Oral Daily  . heparin  5,000 Units Subcutaneous 3 times per day  . insulin aspart  0-20 Units Subcutaneous TID WC  . insulin aspart  0-5 Units Subcutaneous QHS  . insulin aspart  6 Units Subcutaneous TID WC  . insulin aspart protamine- aspart  110 Units Subcutaneous BID WC  . lisinopril  10 mg Oral Daily  . losartan  50 mg Oral Daily  . multivitamin with minerals  1 tablet Oral Daily  . [START ON 05/23/2014] pneumococcal 23 valent vaccine  0.5 mL Intramuscular Tomorrow-1000  . thiamine  100 mg Oral Daily  . vancomycin  1,500  mg Intravenous Q24H    Continuous Infusions: . sodium chloride Stopped (05/22/14 0028)  . sodium chloride 50 mL/hr at 05/22/14 1610    Past Medical History  Diagnosis Date  . Diabetes   . Stroke 2008    left weakness  . Hemorrhoid     Past Surgical History  Procedure Laterality Date  . Colonoscopy  2004  . Cataract extraction      Marijean Niemann, MS, RD, LDN Pager # [phone] After hours/ weekend pager # [phone]

## 2014-05-23 DIAGNOSIS — D638 Anemia in other chronic diseases classified elsewhere: Secondary | ICD-10-CM

## 2014-05-23 LAB — BASIC METABOLIC PANEL
Anion gap: 4 — ABNORMAL LOW (ref 5–15)
BUN: 13 mg/dL (ref 6–23)
CALCIUM: 7.8 mg/dL — AB (ref 8.4–10.5)
CO2: 28 mmol/L (ref 19–32)
Chloride: 102 mmol/L (ref 96–112)
Creatinine, Ser: 1.01 mg/dL (ref 0.50–1.10)
GFR calc Af Amer: 65 mL/min — ABNORMAL LOW (ref >90)
GFR calc non Af Amer: 56 mL/min — ABNORMAL LOW (ref >90)
Glucose, Bld: 248 mg/dL — ABNORMAL HIGH (ref 70–99)
POTASSIUM: 4 mmol/L (ref 3.5–5.1)
SODIUM: 134 mmol/L — AB (ref 135–145)

## 2014-05-23 LAB — GLUCOSE, CAPILLARY
GLUCOSE-CAPILLARY: 54 mg/dL — AB (ref 70–99)
Glucose-Capillary: 219 mg/dL — ABNORMAL HIGH (ref 70–99)
Glucose-Capillary: 372 mg/dL — ABNORMAL HIGH (ref 70–99)
Glucose-Capillary: 108 mg/dL — ABNORMAL HIGH (ref 70–99)
Glucose-Capillary: 107 mg/dL — ABNORMAL HIGH (ref 70–99)

## 2014-05-23 LAB — CBC
HCT: 32.5 % — ABNORMAL LOW (ref 36.0–46.0)
Hemoglobin: 10 g/dL — ABNORMAL LOW (ref 12.0–15.0)
MCH: 29 pg (ref 26.0–34.0)
MCHC: 30.8 g/dL (ref 30.0–36.0)
MCV: 94.2 fL (ref 78.0–100.0)
PLATELETS: 358 10*3/uL (ref 150–400)
RBC: 3.45 MIL/uL — AB (ref 3.87–5.11)
RDW: 14.8 % (ref 11.5–15.5)
WBC: 11.7 10*3/uL — AB (ref 4.0–10.5)

## 2014-05-23 LAB — HEMOGLOBIN A1C
HEMOGLOBIN A1C: 9.1 % — AB (ref 4.8–5.6)
Mean Plasma Glucose: 214 mg/dL

## 2014-05-23 MED ORDER — DEXTROSE 50 % IV SOLN
50.0000 mL | INTRAVENOUS | Status: DC | PRN
  Administered 2014-05-23 – 2014-05-24 (×2): 50 mL via INTRAVENOUS
  Filled 2014-05-23 (×2): qty 50

## 2014-05-23 MED ORDER — INSULIN ASPART PROT & ASPART (70-30 MIX) 100 UNIT/ML ~~LOC~~ SUSP
122.0000 [IU] | Freq: Two times a day (BID) | SUBCUTANEOUS | Status: DC
  Administered 2014-05-23: 122 [IU] via SUBCUTANEOUS
  Filled 2014-05-23: qty 10

## 2014-05-23 MED ORDER — DEXTROSE 50 % IV SOLN: 25.0000 mL | INTRAVENOUS | Status: DC | PRN

## 2014-05-23 MED ORDER — POLYETHYLENE GLYCOL 3350 17 G PO PACK
17.0000 g | PACK | Freq: Every day | ORAL | Status: DC
  Administered 2014-05-24 – 2014-05-25 (×2): 17 g via ORAL
  Filled 2014-05-23 (×2): qty 1

## 2014-05-23 MED ORDER — VANCOMYCIN HCL IN DEXTROSE 750-5 MG/150ML-% IV SOLN
750.0000 mg | Freq: Two times a day (BID) | INTRAVENOUS | Status: DC
  Administered 2014-05-23 – 2014-05-25 (×5): 750 mg via INTRAVENOUS
  Filled 2014-05-23 (×6): qty 150

## 2014-05-23 NOTE — Progress Notes (Signed)
Utilization review completed. Hedda Crumbley, RN, BSN. 

## 2014-05-23 NOTE — Progress Notes (Addendum)
Patient ID: Molly [name], female   DOB: [birth year], 69 y.o.   MRN: [id number] [facility] PROGRESS NOTE  Molly [name] WUJ:[id number] DOB: 1945-12-24 DOA: 05/21/2014 PCP: Lennart Pall, MD  Brief narrative:    69 year old obese female with history of uncontrolled diabetes mellitus with chronic left foot wound (left fifth metatarsal ulceration for several months) who presented to [facility] with worsening drainage and foul-smelling from foot for past one week prior to this admission. Patient was on outpatient treatment with clindamycin with no significant improvement. On admission, patient was found to have a left fifth metatarsal osteomyelitis. Patient underwent left fourth and fifth ray amputation by orthopedic surgery on 05/22/2014. Patient is on broad-spectrum antibiotics.  Assessment/Plan:    Principal Problem: Left fifth metatarsal osteomyelitis / leukocytosis - Likely secondary to poorly controlled diabetes.  - Patient was seen and evaluated by orthopedic surgery.  - Patient is status post fourth and fifth ray amputation on 05/22/2014  - Continue vancomycin and aztreonam. - Blood cultures to date show no growth. - Continue pain management efforts   Active problems:  Uncontrolled type 2 diabetes mellitus with peripheral vascular disease - A1c on admission is 9.1 indicating poor glycemic control.  - Continue Novolog 70/30 110 BID , NovoLog 6 units 3 times daily and sliding scale insulin. - Hold metformin while in the hospital.  Anemia of chronic kidney disease - Hemoglobin is 10. No current indications for transfusion.   Chronic kidney disease stage III - Creatinine normalized with IV fluids.   History of stroke with residual mild left-sided weakness - Plavix was on hold for surgery but now resumed.   Essential hypertension  - Continue lisinopril and losartan   Obesity - Counseled on diet and exercise.    DVT Prophylaxis  - Lovenox subcutaneous ordered   Code  Status: Full.  Family Communication:  plan of care discussed with the patient Disposition Plan: Patient with recent amputation, 05/22/2014, on IV antibiotics. Patient is not stable for discharge today.  IV access:  Peripheral IV  Procedures and diagnostic studies:    Dg Foot Complete Left 05/21/2014   1. Abnormal gas in the soft tissues adjacent to the fifth MTP joint and the fifth metatarsal, with low-level periostitis/periosteal reaction in the fifth metatarsal which could be an indicator of early osteomyelitis. However, overt bony destructive findings are not observed. 2. Calcaneal spurring and dorsal midfoot spurring. 3. The fifth MTP joint also appears wider than it did previously - joint effusion and septic joint not excluded although definite gas is not observed within the joint itself.     Left fourth and fifth ray amputations 05/22/2014  ABI 05/22/2014 RT TBI (0.71) within lower limits of normal. (>0.70) LT TBI (0.37) within severe category of disease (0.30 - 0.39)  Medical Consultants:  Orthopedic surgery   Other Consultants:  Diabetic coordinator  IAnti-Infectives:   Vancomycin and aztreonam 05/21/2014 -->   Manson Passey, MD  [facility] Pager [phone]  If 7PM-7AM, please contact night-coverage [email or web address] Password [facility] 05/23/2014, 11:31 AM   LOS: 2 days    HPI/Subjective: No acute overnight events.  Objective: Filed Vitals:   05/22/14 2133 05/22/14 2246 05/23/14 0304 05/23/14 0637  BP: 128/54 153/86 138/68 145/67  Pulse: 107 53 112 107  Temp: 99 F (37.2 C) 98.7 F (37.1 C) 99.4 F (37.4 C) 99.7 F (37.6 C)  TempSrc: Oral Oral Oral Oral  Resp: Height:      Weight:  SpO2: 97% 97% 94% 98%    Intake/Output Summary (Last 24 hours) at 05/23/14 1131 Last data filed at 05/23/14 0900  Gross per 24 hour  Intake   2710 ml  Output      0 ml  Net   2710 ml    Exam:   General:  Pt is alert, follows commands appropriately, not  in acute distress  Cardiovascular: Regular rate and rhythm, S1/S2, no murmurs  Respiratory: Clear to auscultation bilaterally, no wheezing, no crackles, no rhonchi  Abdomen: Soft, non tender, non distended, bowel sounds present  Extremities: status post amputation left 4th and 5th toe  Neuro: Grossly nonfocal  Data Reviewed: Basic Metabolic Panel:  Recent Labs Lab 05/21/14 1738 05/22/14 0614 05/23/14 0534  NA 131* 134* 134*  K 4.2 4.0 4.0  CL 94* 98 102  CO2 GLUCOSE 376* 293* 248*  BUN CREATININE 1.16* 0.99 1.01  CALCIUM 8.7 8.4 7.8*   Liver Function Tests:  Recent Labs Lab 05/21/14 1738 05/22/14 0614  AST 24 17  ALT 20 18  ALKPHOS 78 66  BILITOT 0.5 0.6  PROT 7.3 6.6  ALBUMIN 2.8* 2.4*   No results for input(s): LIPASE, AMYLASE in the last 168 hours. No results for input(s): AMMONIA in the last 168 hours. CBC:  Recent Labs Lab 05/21/14 1738 05/22/14 0614 05/23/14 0534  WBC 13.6* 9.4 11.7*  NEUTROABS 9.9*  --   --   HGB 11.5* 10.6* 10.0*  HCT 35.9* 33.6* 32.5*  MCV 92.8 92.3 94.2  PLT 331 304 358   Cardiac Enzymes: No results for input(s): CKTOTAL, CKMB, CKMBINDEX, TROPONINI in the last 168 hours. BNP: Invalid input(s): POCBNP CBG:  Recent Labs Lab 05/22/14 1603 05/22/14 1718 05/22/14 2129 05/23/14 0601 05/23/14 1117  GLUCAP 83 93 268* 219* 372*    Recent Results (from the past 240 hour(s))  Blood Cultures x 2 sites     Status: None (Preliminary result)   Collection Time: 05/21/14 11:25 PM  Result Value Ref Range Status   Specimen Description BLOOD LEFT ARM  Final   Special Requests BOTTLES DRAWN AEROBIC ONLY 10CC  Final   Culture   Final           BLOOD CULTURE RECEIVED NO GROWTH TO DATE CULTURE WILL BE HELD FOR 5 DAYS BEFORE ISSUING A FINAL NEGATIVE REPORT Performed at [facility]    Report Status PENDING  Incomplete  Blood Cultures x 2 sites     Status: None (Preliminary result)   Collection Time:  05/21/14 11:36 PM  Result Value Ref Range Status   Specimen Description BLOOD LEFT HAND  Final   Special Requests BOTTLES DRAWN AEROBIC ONLY 8CC  Final   Culture   Final           BLOOD CULTURE RECEIVED NO GROWTH TO DATE CULTURE WILL BE HELD FOR 5 DAYS BEFORE ISSUING A FINAL NEGATIVE REPORT Performed at [facility]    Report Status PENDING  Incomplete  Surgical pcr screen     Status: Abnormal   Collection Time: 05/22/14  3:16 PM  Result Value Ref Range Status   MRSA, PCR NEGATIVE NEGATIVE Final   Staphylococcus aureus POSITIVE (A) NEGATIVE Final     Scheduled Meds: . amitriptyline  25 mg Oral QHS  . aztreonam  1 g Intravenous 3 times per day  . clopidogrel  75 mg Oral Daily  . enoxaparin (LOVENOX) injection  40 mg Subcutaneous Q24H  .  feeding supplement (GLUCERNA SHAKE)  237 mL Oral BID BM  . folic acid  1 mg Oral Daily  . insulin aspart  0-20 Units Subcutaneous TID WC  . insulin aspart  0-5 Units Subcutaneous QHS  . insulin aspart  6 Units Subcutaneous TID WC  . insulin aspart protamine- aspart  110 Units Subcutaneous BID WC  . lisinopril  10 mg Oral Daily  . losartan  50 mg Oral Daily  . multivitamin   1 tablet Oral Daily  . polyethylene glycol  17 g Oral Daily  . scopolamine  1 patch Transdermal Q72H  . thiamine  100 mg Oral Daily  . vancomycin  750 mg Intravenous Q12H   Continuous Infusions: . sodium chloride 50 mL/hr at 05/22/14 2058

## 2014-05-23 NOTE — Progress Notes (Signed)
ANTIBIOTIC CONSULT NOTE - FOLLOW UP  Pharmacy Consult for vancomycin, azactam Indication: L foot osteomyelitis  No Known Allergies  Patient Measurements: Height: 5' 6.14" (168 cm) Weight: 260 lb (117.935 kg) IBW/kg (Calculated) : 59.63   Vital Signs: Temp: 99.7 F (37.6 C) (02/24 0637) Temp Source: Oral (02/24 0637) BP: 145/67 mmHg (02/24 1610) Pulse Rate: 107 (02/24 0637) Intake/Output from previous day: 02/23 0701 - 02/24 0700 In: 2590 [P.O.:1340; I.V.:500; IV Piggyback:750] Out: -  Intake/Output from this shift:    Labs:  Recent Labs  05/21/14 1738 05/22/14 0614 05/23/14 0534  WBC 13.6* 9.4 11.7*  HGB 11.5* 10.6* 10.0*  PLT 331 304 358  CREATININE 1.16* 0.99 1.01   Estimated Creatinine Clearance: 69.8 mL/min (by C-G formula based on Cr of 1.01). No results for input(s): VANCOTROUGH, VANCOPEAK, VANCORANDOM, GENTTROUGH, GENTPEAK, GENTRANDOM, TOBRATROUGH, TOBRAPEAK, TOBRARND, AMIKACINPEAK, AMIKACINTROU, AMIKACIN in the last 72 hours.   Microbiology: Recent Results (from the past 720 hour(s))  Blood Cultures x 2 sites     Status: None (Preliminary result)   Collection Time: 05/21/14 11:25 PM  Result Value Ref Range Status   Specimen Description BLOOD LEFT ARM  Final   Special Requests BOTTLES DRAWN AEROBIC ONLY 10CC  Final   Culture   Final           BLOOD CULTURE RECEIVED NO GROWTH TO DATE CULTURE WILL BE HELD FOR 5 DAYS BEFORE ISSUING A FINAL NEGATIVE REPORT Performed at [facility]    Report Status PENDING  Incomplete  Blood Cultures x 2 sites     Status: None (Preliminary result)   Collection Time: 05/21/14 11:36 PM  Result Value Ref Range Status   Specimen Description BLOOD LEFT HAND  Final   Special Requests BOTTLES DRAWN AEROBIC ONLY 8CC  Final   Culture   Final           BLOOD CULTURE RECEIVED NO GROWTH TO DATE CULTURE WILL BE HELD FOR 5 DAYS BEFORE ISSUING A FINAL NEGATIVE REPORT Performed at [facility]    Report Status PENDING   Incomplete  Surgical pcr screen     Status: Abnormal   Collection Time: 05/22/14  3:16 PM  Result Value Ref Range Status   MRSA, PCR NEGATIVE NEGATIVE Final   Staphylococcus aureus POSITIVE (A) NEGATIVE Final    Comment:        The Xpert SA Assay (FDA approved for NASAL specimens in patients over 73 years of age), is one component of a comprehensive surveillance program.  Test performance has been validated by [facility] for patients greater than or equal to 30 year old. It is not intended to diagnose infection nor to guide or monitor treatment.     Anti-infectives    Start     Dose/Rate Route Frequency Ordered Stop   05/22/14 2200  vancomycin (VANCOCIN) 1,500 mg in sodium chloride 0.9 % 500 mL IVPB     1,500 mg 250 mL/hr over 120 Minutes Intravenous Every 24 hours 05/21/14 2330     05/21/14 2359  vancomycin (VANCOCIN) 2,000 mg in sodium chloride 0.9 % 500 mL IVPB     2,000 mg 250 mL/hr over 120 Minutes Intravenous  Once 05/21/14 2330 05/22/14 0439   05/21/14 2345  aztreonam (AZACTAM) 1 g in dextrose 5 % 50 mL IVPB     1 g 100 mL/hr over 30 Minutes Intravenous 3 times per day 05/21/14 2330     05/21/14 2045  nafcillin 2 g in dextrose 5 %  50 mL IVPB     2 g 100 mL/hr over 30 Minutes Intravenous  Once 05/21/14 2033 05/21/14 2126      Assessment: 69 yo female with left fifth metatarsal osteomyelitis on vancomycin and azactam and noted s/p left fourth and fifth ray amputations. WBC= 11.7, tmax= 99.7, SCr= 1.01 and CrCl ~ 60 (normalized).  Vanc 2/23>> Aztreo 2/23>>  2/22 blood x2  Goal of Therapy:  Vancomycin trough level 15-20 mcg/ml  Plan:  -Will osteo will change vancomycin to 750mg  IV q12h -No azactam dose changes needed -Will follow renal function, cultures and clinical progress  Harland Germanndrew Jacek Colson, Pharm D 05/23/2014 10:08 AM

## 2014-05-23 NOTE — Evaluation (Signed)
Physical Therapy Evaluation Patient Details Name: Molly [name] MRN: [id number] DOB: 20-Aug-1945 Today's Date: 05/23/2014   History of Present Illness  Admitted with L foot wound, now s/p AMPUTATION RAY LEFT FOURTH AND FIFTH; Pertinent Hx: CKD, DM, chronic neuropathy, stroke (flaccid LUE)  Clinical Impression  Patient is s/p above surgery resulting in functional limitations due to the deficits listed below (see PT Problem List).  Patient will benefit from skilled PT to increase their independence and safety with mobility to allow discharge to the venue listed below.       Follow Up Recommendations SNF    Equipment Recommendations  Wheelchair (measurements PT);Wheelchair cushion (measurements PT)    Recommendations for Other Services       Precautions / Restrictions Precautions Precautions: Fall Restrictions LLE Weight Bearing: Weight bearing as tolerated Other Position/Activity Restrictions: for transfers only      Mobility  Bed Mobility                  Transfers Overall transfer level: Needs assistance Equipment used: 1 person hand held assist (support at gait belt and R knee blocked) Transfers: Sit to/from Stand Sit to Stand: Max assist         General transfer comment: 2 attempts with unilateral assist on R side without success; Third attempt with successful stand with support bilaterally at gait belt and R knee blocked; Good anticipatory forward weight shift in prep for transfer  Ambulation/Gait                Stairs            Wheelchair Mobility    Modified Rankin (Stroke Patients Only)       Balance                                             Pertinent Vitals/Pain Pain Assessment: Faces Faces Pain Scale: Hurts little more Pain Location: L foot with activity Pain Descriptors / Indicators: Grimacing Pain Intervention(s): Limited activity within patient's tolerance;Monitored during session;Repositioned     Home Living Family/patient expects to be discharged to:: Skilled nursing facility Living Arrangements: Spouse/significant other                    Prior Function Level of Independence: Independent with assistive device(s)         Comments: household distance with cane     Hand Dominance   Dominant Hand: Right    Extremity/Trunk Assessment   Upper Extremity Assessment: Generalized weakness (hemiplegic; some active shoulder motion )           Lower Extremity Assessment: LLE deficits/detail   LLE Deficits / Details: dressing L foot without significant drainage; Weakness from CVA 8 years ago; can initiate movement against gravity, though weak     Communication   Communication: No difficulties  Cognition Arousal/Alertness: Awake/alert Behavior During Therapy: WFL for tasks assessed/performed Overall Cognitive Status: Within Functional Limits for tasks assessed                      General Comments      Exercises        Assessment/Plan    PT Assessment Patient needs continued PT services  PT Diagnosis Generalized weakness;Difficulty walking;Acute pain   PT Problem List Decreased strength;Decreased range of motion;Decreased activity tolerance;Decreased balance;Decreased mobility;Decreased knowledge of use of DME;Pain;Decreased  knowledge of precautions;Impaired tone;Obesity  PT Treatment Interventions DME instruction;Gait training;Functional mobility training;Therapeutic activities;Therapeutic exercise;Balance training;Neuromuscular re-education;Patient/family education;Wheelchair mobility training   PT Goals (Current goals can be found in the Care Plan section) Acute Rehab PT Goals Patient Stated Goal: to be able to manage PT Goal Formulation: With patient Time For Goal Achievement: 06/06/14    Frequency Min 3X/week   Barriers to discharge        Co-evaluation               End of Session Equipment Utilized During Treatment: Gait  belt Activity Tolerance: Patient tolerated treatment well Patient left: in chair;with call bell/phone within reach;with family/visitor present Nurse Communication: Mobility status         Time: 1359-1430 PT Time Calculation (min) (ACUTE ONLY): 31 min   Charges:   PT Evaluation $Initial PT Evaluation Tier I: 1 Procedure PT Treatments $Therapeutic Activity: 8-22 mins   PT G Codes:        Olen Pel 05/23/2014, 3:21 PM  Van Clines, [place]  Acute Rehabilitation Services Pager [phone] Office [phone]

## 2014-05-23 NOTE — Progress Notes (Signed)
Subjective: 1 Day Post-Op Procedure(s) (LRB): AMPUTATION RAY LEFT FOURTH AND FIFTH  (Left) Patient reports pain as mild.  No n/v/f/c.  Objective: Vital signs in last 24 hours: Temp:  [97.3 F (36.3 C)-99.7 F (37.6 C)] 99.7 F (37.6 C) (02/24 0637) Pulse Rate:  [53-112] 107 (02/24 0637) Resp:  [15-21] 18 (02/24 0637) BP: (94-153)/(44-86) 145/67 mmHg (02/24 0637) SpO2:  [84 %-100 %] 98 % (02/24 0637)  Intake/Output from previous day: 02/23 0701 - 02/24 0700 In: 2590 [P.O.:1340; I.V.:500; IV Piggyback:750] Out: -  Intake/Output this shift:     Recent Labs  05/21/14 1738 05/22/14 0614 05/23/14 0534  HGB 11.5* 10.6* 10.0*    Recent Labs  05/22/14 0614 05/23/14 0534  WBC 9.4 11.7*  RBC 3.64* 3.45*  HCT 33.6* 32.5*  PLT 304 358    Recent Labs  05/22/14 0614 05/23/14 0534  NA 134* 134*  K 4.0 4.0  CL 98 102  CO2 30 28  BUN 13 13  CREATININE 0.99 1.01  GLUCOSE 293* 248*  CALCIUM 8.4 7.8*   No results for input(s): LABPT, INR in the last 72 hours.  PE:  obese female in nad.  L foot with dressing in place.  Dry and intact.  Assessment/Plan: 1 Day Post-Op Procedure(s) (LRB): AMPUTATION RAY LEFT FOURTH AND FIFTH  (Left) Up with therapy  WB on the L foot for transfers in the hard sole shoe.  O/w keep extremity elevated to minimize bleeding and swelling in this anticoagulated patient.  Abx per ID / medicine teams.  Toni ArthursHEWITT, Arieonna Medine 05/23/2014, 7:32 AM

## 2014-05-23 NOTE — Progress Notes (Signed)
Upon HS check of FSBS noted reading of 54. Pt had been resting w eyes closed, no s/s distresses noted. When pt was awake and eyes open earlier in shift she did not complain of any adverse symptoms. Pt currently drowsy but arousable after RN stating her name twice. Remains at baseline orientation. Pt states she feels "lazy," but states no other symptoms. Dr was paged and orders for hypoglycemia management received and carried through. Pt received IV dextrose. Pending FSBS recheck in 15 minutes.

## 2014-05-23 NOTE — Progress Notes (Signed)
Inpatient Diabetes Program Recommendations  AACE/ADA: New Consensus Statement on Inpatient Glycemic Control (2013)  Target Ranges:  Prepandial:   less than 140 mg/dL      Peak postprandial:   less than 180 mg/dL (1-2 hours)      Critically ill patients:  140 - 180 mg/dL   Reason for Assessment:  Results for Molly [name], Molly [name] (MRN [id number]) as of 05/23/2014 11:23  Ref. Range 05/23/2014 06:01 05/23/2014 11:17  Glucose-Capillary Latest Range: 70-99 mg/dL 865219 (H) 784372 (H)    Diabetes history: Diabetes Mellitus Outpatient Diabetes medications: Metformin  1000 mg bid, Novolog 70/30 110 units bid Current orders for Inpatient glycemic control:  Novolog resistant tid with meals and HS, Novolog 6 units tid with meals, Novolog 70/30 110 units bid  Please discontinue Novolog 6 units tid with meals as meal coverage is built in to the Novolog 70/30.  Consider increasing Novolog 70/30 to 120 units bid.      Will follow.  Thanks, Beryl MeagerJenny Donnia Poplaski, RN, BC-ADM Inpatient Diabetes Coordinator Pager [phone]

## 2014-05-23 NOTE — Op Note (Signed)
NAME:  Molly [name] [name], Molly [name]               ACCOUNT NO.:  [id number]  MEDICAL RECORD NO.:  [id number]  LOCATION:  [id number]                        FACILITY:  [facility]  PHYSICIAN:  Molly [name] ArthursJohn Ascher Schroepfer, MD        DATE OF BIRTH:  1945-08-03  DATE OF PROCEDURE:  05/22/2014 DATE OF DISCHARGE:                              OPERATIVE REPORT   PREOPERATIVE DIAGNOSIS:  Left foot osteomyelitis.  POSTOPERATIVE DIAGNOSIS:  Left foot osteomyelitis.  PROCEDURE:  Left fourth and fifth ray amputations.  SURGEON:  Molly [name] ArthursJohn Farris Blash, MD.  ANESTHESIA:  General.  ESTIMATED BLOOD LOSS:  Minimal.  TOURNIQUET TIME:  Approximately 20 minutes with an ankle Esmarch.  COMPLICATIONS:  None apparent.  DISPOSITION:  Extubated, awake, and stable to recovery.  INDICATIONS FOR PROCEDURE:  The patient is a 69 year old woman with past medical history significant for diabetes complicated by peripheral neuropathy.  She has a history of an ulcer at the fifth metatarsal, treated by podiatrist for the last several months.  Over the last week, she noticed increased drainage and foul smell from the foot.  She presented to the emergency department, was admitted and started on IV antibiotics.  Her wound is noted to have exposed bone throughout the base consistent with osteomyelitis.  She presents now for fourth and fifth ray amputations to allow adequate soft tissue coverage for this area of ulceration and osteo.  She understands the risks and benefits, the alternative treatment options, and elects surgical treatment.  She specifically understands risks of bleeding, infection, nerve damage, blood clots, need for additional surgery, continued pain, amputation, and death.  PROCEDURE IN DETAIL:  After preoperative consent was obtained and the correct operative site was identified, the patient was brought to the operating room and placed supine on the operating table.  General anesthesia was induced.  A surgical time-out was taken.  The patient  was already on therapeutic antibiotics.  Left lower extremity was prepped and draped in standard sterile fashion.  The foot was exsanguinated and a 4-inch Esmarch tourniquet was wrapped around the ankle.  A racquet style incision was marked on the skin at the base of the fourth and fifth toes extending proximally along the fifth metatarsal shaft. Incision was made.  Sharp dissection was carried down through the skin and subcutaneous tissue.  The fourth and fifth toes were disarticulated through the MTP joints.  Subperiosteal dissection was then carried dorsally and proximally over the fourth metatarsal and fifth metatarsal. Areas of necrosis and purulence were sharply excised with a scalpel.  An oscillating saw was then used to cut the fourth and fifth metatarsals at the metaphyseal bone proximally.  The cuts were beveled laterally and plantarly.  The remaining soft tissue appeared generally healthy and viable.  The wound was then irrigated copiously with normal saline.  All the vascular structures were cauterized.  Simple and retention sutures of 2-0 nylon were used to close the skin in loose fashion.  Sterile dressings were applied followed by a compression wrap.  Tourniquet was released at approximately 20 minutes.  The patient was awakened from anesthesia and transported to the recovery room in stable condition.  FOLLOWUP PLAN:  The patient will be weightbearing as  tolerated on her left foot and a Darco style shoe.  She will follow up with me in the office in 2-3 weeks for suture removal.  She will continue on IV antibiotics per the Infectious Disease team.     Molly [name] Arthurs, MD     JH/MEDQ  D:  05/22/2014  T:  05/23/2014  Job:  [id number]

## 2014-05-24 ENCOUNTER — Encounter (HOSPITAL_COMMUNITY): Payer: Self-pay | Admitting: Orthopedic Surgery

## 2014-05-24 ENCOUNTER — Ambulatory Visit: Payer: Medicare HMO | Admitting: Podiatry

## 2014-05-24 DIAGNOSIS — D72829 Elevated white blood cell count, unspecified: Secondary | ICD-10-CM

## 2014-05-24 LAB — GLUCOSE, CAPILLARY
GLUCOSE-CAPILLARY: 128 mg/dL — AB (ref 70–99)
GLUCOSE-CAPILLARY: 121 mg/dL — AB (ref 70–99)
Glucose-Capillary: 50 mg/dL — ABNORMAL LOW (ref 70–99)
Glucose-Capillary: 98 mg/dL (ref 70–99)
Glucose-Capillary: 110 mg/dL — ABNORMAL HIGH (ref 70–99)
Glucose-Capillary: 275 mg/dL — ABNORMAL HIGH (ref 70–99)
Glucose-Capillary: 203 mg/dL — ABNORMAL HIGH (ref 70–99)
Glucose-Capillary: 184 mg/dL — ABNORMAL HIGH (ref 70–99)

## 2014-05-24 MED ORDER — LIVING WELL WITH DIABETES BOOK
Freq: Once | Status: AC
  Administered 2014-05-24: 15:00:00
  Filled 2014-05-24: qty 1

## 2014-05-24 MED ORDER — INSULIN ASPART PROT & ASPART (70-30 MIX) 100 UNIT/ML ~~LOC~~ SUSP
110.0000 [IU] | Freq: Two times a day (BID) | SUBCUTANEOUS | Status: DC
  Administered 2014-05-24: 110 [IU] via SUBCUTANEOUS

## 2014-05-24 MED ORDER — DEXTROSE-NACL 5-0.45 % IV SOLN
INTRAVENOUS | Status: DC
  Administered 2014-05-24: 03:00:00 via INTRAVENOUS

## 2014-05-24 MED ORDER — INSULIN ASPART PROT & ASPART (70-30 MIX) 100 UNIT/ML ~~LOC~~ SUSP
100.0000 [IU] | Freq: Two times a day (BID) | SUBCUTANEOUS | Status: DC
  Administered 2014-05-24 – 2014-05-25 (×2): 100 [IU] via SUBCUTANEOUS

## 2014-05-24 NOTE — Progress Notes (Signed)
Noted 2nd event of hypoglycemia, FSBS reading of 50. Pt arousable to name beind called after 3x. Arousable to shake of arm. Pt disoriented to location. Drowsiness continues, more difficult to keep patient alert after she opens her eyes. Dextrose administered. This RN paged Dr again regarding results and patient status.

## 2014-05-24 NOTE — Clinical Social Work Placement (Addendum)
Clinical Social Work Department CLINICAL SOCIAL WORK PLACEMENT NOTE 05/24/2014  Patient:  [name],Molly [name]  Account Number:  [id number] Admit date:  05/21/2014  Clinical Social Worker:  Mosie EpsteinEMILY S Afsana Liera, LCSWA  Date/time:  05/24/2014 02:47 PM  Clinical Social Work is seeking post-discharge placement for this patient at the following level of care:   SKILLED NURSING   (*CSW will update this form in Epic as items are completed)   05/24/2014  Patient/family provided with [facility] of Clinical Social Work's list of facilities offering this level of care within the geographic area requested by the patient (or if unable, by the patient's family).  05/24/2014  Patient/family informed of their freedom to choose among providers that offer the needed level of care, that participate in Medicare, Medicaid or managed care program needed by the patient, have an available bed and are willing to accept the patient.  05/24/2014  Patient/family informed of [facility]' ownership interest in [facility], as well as of the fact that they are under no obligation to receive care at this facility.  PASARR submitted to EDS on 05/24/2014 PASARR number received on 05/24/2014  FL2 transmitted to all facilities in geographic area requested by pt/family on  05/24/2014 FL2 transmitted to all facilities within larger geographic area on   Patient informed that his/her managed care company has contracts with or will negotiate with  certain facilities, including the following:     Patient/family informed of bed offers received:  05/25/2014 [facility] recommends and patient chooses bed at    [facility]  05/25/2014 Patient to be transferred to facility by [facility] Patient and family notified of transfer on 05/25/2014 Name of family member notified:  Patient and patient's husband updated at bedside.  The  following physician request were entered in Epic:   Additional Comments:  Lily Kochermily Claudy Abdallah, LCSWA [phone]) Licensed Clinical Social Worker Orthopedics [phone]([id number]) and Surgical [phone]([id number])

## 2014-05-24 NOTE — Plan of Care (Signed)
Problem: Phase I Progression Outcomes Goal: Pain controlled with appropriate interventions Outcome: Completed/Met Date Met:  05/24/14 Pt has not c/o nor received pain medication   Problem: Phase III Progression Outcomes Goal: Pain controlled on oral analgesia Outcome: Completed/Met Date Met:  05/24/14 Pt has not c/o pain nor received pain medications

## 2014-05-24 NOTE — Progress Notes (Signed)
Patient ID: Molly [name], female   DOB: Sep 29, 1945, 69 y.o.   MRN: [id number] [facility] PROGRESS NOTE  Molly [name] Molly [name]:[id number] DOB: 07-17-1945 DOA: 05/21/2014 PCP: Lennart Pall, MD  Brief narrative:    69 year old obese female with history of uncontrolled diabetes mellitus with chronic left foot wound (left fifth metatarsal ulceration for several months) who presented to [facility] with worsening drainage and foul-smelling from foot for past one week prior to this admission. Patient was on outpatient treatment with clindamycin with no significant improvement. On admission, patient was found to have a left fifth metatarsal osteomyelitis. Patient underwent left fourth and fifth ray amputation by orthopedic surgery on 05/22/2014. Patient is on broad-spectrum antibiotics, vanco and aztreonam.  Assessment/Plan:    Principal Problem: Left fifth metatarsal osteomyelitis / leukocytosis - X ray of the left foot on 05/21/2014 showed abnormal gas in the soft tissues adjacent to the fifth MTP joint and the fifth metatarsal, with low-level periostitis/periosteal reaction in the fifth metatarsal which could be an indicator of early osteomyelitis. - Orthopedic surgery has seen the pt in consultation. - Patient is status post fourth and fifth ray amputation on 05/22/2014  - Continue vancomycin and aztreonam. Blood cultures to date are negative.  Active problems:  Uncontrolled type 2 diabetes mellitus with peripheral vascular disease - A1c on admission is 9.1 indicating poor glycemic control.  - Had few episodes of hypoglycemia so per DM coordinator we stopped novolog and increased Novolog 70/30 mix to 110 BID. He continued to have few episodes of hypoglycemia so will reduce Novolog 70/30 to 100 units BID. - Metformin on hold  Anemia of chronic kidney disease - Hemoglobin is 10, stable. - No reports of bleeding.   Chronic kidney disease stage III - Creatinine normalized with IV fluids.    History of stroke with residual mild left-sided weakness - Continue Plavix.  Essential hypertension  - Continue lisinopril and losartan   Obesity - Counseled on diet and exercise.   DVT Prophylaxis  - Lovenox subcutaneous ordered   Code Status: Full.  Family Communication:  plan of care discussed with the patient Disposition Plan: Patient with recent amputation, 05/22/2014, still on IV antibiotics. Patient is not stable for discharge today.  IV access:  Peripheral IV  Procedures and diagnostic studies:    Dg Foot Complete Left 05/21/2014   1. Abnormal gas in the soft tissues adjacent to the fifth MTP joint and the fifth metatarsal, with low-level periostitis/periosteal reaction in the fifth metatarsal which could be an indicator of early osteomyelitis. However, overt bony destructive findings are not observed. 2. Calcaneal spurring and dorsal midfoot spurring. 3. The fifth MTP joint also appears wider than it did previously - joint effusion and septic joint not excluded although definite gas is not observed within the joint itself.     Left fourth and fifth ray amputations 05/22/2014  ABI 05/22/2014 RT TBI (0.71) within lower limits of normal. (>0.70) LT TBI (0.37) within severe category of disease (0.30 - 0.39)  Medical Consultants:  Orthopedic surgery   Other Consultants:  Diabetic coordinator  IAnti-Infectives:   Vancomycin and aztreonam 05/21/2014 -->    Molly Passey, MD  [facility] Pager [phone]  If 7PM-7AM, please contact night-coverage [email or web address] Password [facility] 05/24/2014, 2:17 PM   LOS: 3 days    HPI/Subjective: No acute overnight events.  Objective: Filed Vitals:   05/23/14 1300 05/23/14 2016 05/24/14 0703 05/24/14 1300  BP: 157/74 131/87 140/60 144/55  Pulse: 103 109 105 104  Temp: 99.3 F (37.4 C) 98.3 F (36.8 C) 98.3 F (36.8 C) 98.2 F (36.8 C)  TempSrc:  Oral Oral   Resp: 18   18  Height:      Weight:      SpO2: 94% 92%  92% 97%    Intake/Output Summary (Last 24 hours) at 05/24/14 1417 Last data filed at 05/24/14 1300  Gross per 24 hour  Intake 1459.17 ml  Output      0 ml  Net 1459.17 ml    Exam:   General: Pt is alert, follows commands appropriately, not in acute distress  Cardiovascular: Regular rate and rhythm, S1/S2, no murmurs  Respiratory: Clear to auscultation bilaterally, no wheezing, no crackles, no rhonchi  Abdomen: Soft, non tender, non distended, bowel sounds present  Extremities: dressing in place, no edema    Neuro: Grossly nonfocal  Data Reviewed: Basic Metabolic Panel:  Recent Labs Lab 05/21/14 1738 05/22/14 0614 05/23/14 0534  Molly 131* 134* 134*  K 4.2 4.0 4.0  CL 94* 98 102  CO2 GLUCOSE 376* 293* 248*  BUN CREATININE 1.16* 0.99 1.01  CALCIUM 8.7 8.4 7.8*   Liver Function Tests:  Recent Labs Lab 05/21/14 1738 05/22/14 0614  AST 24 17  ALT 20 18  ALKPHOS 78 66  BILITOT 0.5 0.6  PROT 7.3 6.6  ALBUMIN 2.8* 2.4*   No results for input(s): LIPASE, AMYLASE in the last 168 hours. No results for input(s): AMMONIA in the last 168 hours. CBC:  Recent Labs Lab 05/21/14 1738 05/22/14 0614 05/23/14 0534  WBC 13.6* 9.4 11.7*  NEUTROABS 9.9*  --   --   HGB 11.5* 10.6* 10.0*  HCT 35.9* 33.6* 32.5*  MCV 92.8 92.3 94.2  PLT 331 304 358   Cardiac Enzymes: No results for input(s): CKTOTAL, CKMB, CKMBINDEX, TROPONINI in the last 168 hours. BNP: Invalid input(s): POCBNP CBG:  Recent Labs Lab 05/24/14 0152 05/24/14 0311 05/24/14 0541 05/24/14 0650 05/24/14 1118  GLUCAP 128* 98 110* 121* 275*    Recent Results (from the past 240 hour(s))  Blood Cultures x 2 sites     Status: None (Preliminary result)   Collection Time: 05/21/14 11:25 PM  Result Value Ref Range Status   Specimen Description BLOOD LEFT ARM  Final   Special Requests BOTTLES DRAWN AEROBIC ONLY 10CC  Final   Culture   Final           BLOOD CULTURE RECEIVED NO  GROWTH TO DATE    Report Status PENDING  Incomplete  Blood Cultures x 2 sites     Status: None (Preliminary result)   Collection Time: 05/21/14 11:36 PM  Result Value Ref Range Status   Specimen Description BLOOD LEFT HAND  Final   Special Requests BOTTLES DRAWN AEROBIC ONLY 8CC  Final   Culture   Final           BLOOD CULTURE RECEIVED NO GROWTH TO DATE   Report Status PENDING  Incomplete  Surgical pcr screen     Status: Abnormal   Collection Time: 05/22/14  3:16 PM  Result Value Ref Range Status   MRSA, PCR NEGATIVE NEGATIVE Final   Staphylococcus aureus POSITIVE (A) NEGATIVE Final     Scheduled Meds: . amitriptyline  25 mg Oral QHS  . aztreonam  1 g Intravenous 3 times per day  . clopidogrel  75 mg Oral Daily  . enoxaparin (LOVENOX) injection  40 mg Subcutaneous Q24H  .  feeding supplement (GLUCERNA SHAKE)  237 mL Oral BID BM  . folic acid  1 mg Oral Daily  . insulin aspart  0-20 Units Subcutaneous TID WC  . insulin aspart  0-5 Units Subcutaneous QHS  . insulin aspart protamine- aspart  100 Units Subcutaneous BID WC  . lisinopril  10 mg Oral Daily  . losartan  50 mg Oral Daily  . multivitamin with minerals  1 tablet Oral Daily  . polyethylene glycol  17 g Oral Daily  . scopolamine  1 patch Transdermal Q72H  . thiamine  100 mg Oral Daily  . vancomycin  750 mg Intravenous Q12H   Continuous Infusions: . sodium chloride Stopped (05/24/14 0303)

## 2014-05-24 NOTE — Progress Notes (Signed)
Inpatient Diabetes Program Recommendations  AACE/ADA: New Consensus Statement on Inpatient Glycemic Control (2013)  Target Ranges:  Prepandial:   less than 140 mg/dL      Peak postprandial:   less than 180 mg/dL (1-2 hours)      Critically ill patients:  140 - 180 mg/dL   Reason for Assessment:  Results for Molly [name], Avnoor P (MRN [id number]) as of 05/24/2014 14:57  Ref. Range 05/23/2014 21:37 05/23/2014 22:39 05/24/2014 01:24 05/24/2014 01:52 05/24/2014 03:11 05/24/2014 05:41 05/24/2014 06:50 05/24/2014 11:18  Glucose-Capillary Latest Range: 70-99 mg/dL 54 (L) 130107 (H) 50 (L) 865128 (H) 98 110 (H) 121 (H) 275 (H)   Diabetes history:  Diabetes Mellitus Outpatient Diabetes medications:  Novolog 70/30 110 units bid- Patient confirmed this dose of insulin Current orders for Inpatient glycemic control:  Novolog 70/30 110 units bid- Note that insulin dose decreased after lows last night. When I asked patient about whether she has low CBG's at home, she states my sugars are up and down no matter what I do.  She admits that she has not been as active recently due to her foot.  She states that she and her husband have a gym in the basement.  We discussed that the insulin makes her hungry and she has gained weight.  We discussed the importance of proper eating and nutrition.  Also encouraged "Chair workouts" if she is unable to be on her feet.  She states she has several videos. Also discussed that increased activity can lower CBG's and potentially lower her insulin needs.  I will order her the "Living Well with Diabetes" booklet.  She is appreciative.    Thanks, Beryl MeagerJenny Eian Vandervelden, RN, BC-ADM Inpatient Diabetes Coordinator Pager [phone]

## 2014-05-24 NOTE — Clinical Social Work Psychosocial (Signed)
Clinical Social Work Department BRIEF PSYCHOSOCIAL ASSESSMENT 05/24/2014  Patient:  Molly [name], Molly [name]     Account Number:  [id number]     Admit date:  05/21/2014  Clinical Social Worker:  Durward Fortes, CLINICAL SOCIAL WORKER  Date/Time:  05/24/2014 02:16 PM  Referred by:  Physician  Date Referred:  05/24/2014 Referred for  SNF Placement   Other Referral:   none.   Interview type:  Patient Other interview type:   none.    PSYCHOSOCIAL DATA Living Status:  HUSBAND Admitted from facility:   Level of care:   Primary support name:  Molly [name] Primary support relationship to patient:  SPOUSE Degree of support available:   Adequate support.    CURRENT CONCERNS Current Concerns  Post-Acute Placement   Other Concerns:   none.    SOCIAL WORK ASSESSMENT / PLAN CSW and BSW-Intern consulted regarding possible SNF placement for pt once medically stable for discharge.    BSW-Intern met with pt at bedside to discuss possible SNF placement options for pt once pt is medically stable for discharge. Pt informed BSW-Intern that pt would like to further therapy at [facility] (first option being [facility]).     Pt went on to inform BSW-Intern that pt lives with pt's husband, [name]. Pt mentioned to BSW-Intern that pt's husband is not in the best health, however has been taking care of pt since pt's stroke in 2008. BSW-Intern raised the question to pt as to who would be helping pt once pt is medically stable for discharge from [facility]. Pt agrees that pt's husband will be the person responsible for pt's plan of care at that time of discharge.    Pt is very joyful and involved in pt's plan of care. Pt is open to the idea of receiving further therapy to better gain strength required for daily tasks once pt returns back home.    CSW and BSW-Intern to continue to assist with discharge planning needs.   Assessment/plan status:  Psychosocial Support/Ongoing Assessment of  Needs Other assessment/ plan:   none.   Information/referral to community resources:   Pt to be discharge to [facility] once medically stable for discharge.    PATIENT'S/FAMILY'S RESPONSE TO PLAN OF CARE: Pt agreeable to CSW plan of care. Pt expressed no further questions or concerns at this time.        Virgie Dad Wiley, BSW-Intern  Seen and agreed.  Lubertha Sayres, [place] [phone]) Licensed Clinical Social Worker Orthopedics [phone]) and Surgical [phone])

## 2014-05-24 NOTE — Progress Notes (Signed)
Subjective: 2 Days Post-Op Procedure(s) (LRB): AMPUTATION RAY LEFT FOURTH AND FIFTH  (Left) Patient reports pain as mild.  Able to bear weight in post op shoe.  On IV vanc and aztreonam.  Objective: Vital signs in last 24 hours: Temp:  [98.2 F (36.8 C)-98.3 F (36.8 C)] 98.2 F (36.8 C) (02/25 1300) Pulse Rate:  [104-109] 104 (02/25 1300) Resp:  [18] 18 (02/25 1300) BP: (131-144)/(55-87) 144/55 mmHg (02/25 1300) SpO2:  [92 %-97 %] 97 % (02/25 1300)  Intake/Output from previous day: 02/24 0701 - 02/25 0700 In: 1459.2 [P.O.:840; I.V.:169.2; IV Piggyback:450] Out: -  Intake/Output this shift: Total I/O In: 600 [P.O.:600] Out: -    Recent Labs  05/21/14 1738 05/22/14 0614 05/23/14 0534  HGB 11.5* 10.6* 10.0*    Recent Labs  05/22/14 0614 05/23/14 0534  WBC 9.4 11.7*  RBC 3.64* 3.45*  HCT 33.6* 32.5*  PLT 304 358    Recent Labs  05/22/14 0614 05/23/14 0534  NA 134* 134*  K 4.0 4.0  CL 98 102  CO2 30 28  BUN 13 13  CREATININE 0.99 1.01  GLUCOSE 293* 248*  CALCIUM 8.4 7.8*   No results for input(s): LABPT, INR in the last 72 hours.  PE:  L foot dressed and dry.  No lymphangiitis.  Toes are well perfused.  Assessment/Plan: 2 Days Post-Op Procedure(s) (LRB): AMPUTATION RAY LEFT FOURTH AND FIFTH  (Left) Up with therapy  WBAT on the left foot for transfers in the post op shoe.  Continue IV abx per Dr. Elisabeth Pigeonevine.  Toni ArthursHEWITT, Lowry Bala 05/24/2014, 4:52 PM

## 2014-05-25 DIAGNOSIS — I10 Essential (primary) hypertension: Secondary | ICD-10-CM

## 2014-05-25 LAB — GLUCOSE, CAPILLARY
GLUCOSE-CAPILLARY: 176 mg/dL — AB (ref 70–99)
Glucose-Capillary: 124 mg/dL — ABNORMAL HIGH (ref 70–99)
Glucose-Capillary: 202 mg/dL — ABNORMAL HIGH (ref 70–99)

## 2014-05-25 MED ORDER — ALUM & MAG HYDROXIDE-SIMETH 200-200-20 MG/5ML PO SUSP: 30.0000 mL | Freq: Four times a day (QID) | ORAL | Status: DC | PRN

## 2014-05-25 MED ORDER — WHITE PETROLATUM GEL
Status: AC
  Filled 2014-05-25: qty 1

## 2014-05-25 MED ORDER — AMOXICILLIN-POT CLAVULANATE 875-125 MG PO TABS: 1.0000 | ORAL_TABLET | Freq: Two times a day (BID) | ORAL | Status: DC

## 2014-05-25 MED ORDER — FLUTICASONE PROPIONATE 50 MCG/ACT NA SUSP
1.0000 | Freq: Every day | NASAL | Status: DC
  Filled 2014-05-25: qty 16

## 2014-05-25 MED ORDER — ACETAMINOPHEN 325 MG PO TABS: 650.0000 mg | ORAL_TABLET | Freq: Four times a day (QID) | ORAL | Status: DC | PRN

## 2014-05-25 MED ORDER — INSULIN ASPART 100 UNIT/ML ~~LOC~~ SOLN: 0.0000 [IU] | Freq: Three times a day (TID) | SUBCUTANEOUS | Status: DC

## 2014-05-25 MED ORDER — DOXYCYCLINE MONOHYDRATE 100 MG PO TABS: 100.0000 mg | ORAL_TABLET | Freq: Two times a day (BID) | ORAL | Status: DC

## 2014-05-25 MED ORDER — INSULIN ASPART PROT & ASPART (70-30 MIX) 100 UNIT/ML ~~LOC~~ SUSP: 100.0000 [IU] | Freq: Two times a day (BID) | SUBCUTANEOUS | Status: DC

## 2014-05-25 MED ORDER — OXYCODONE HCL 5 MG PO TABS: 5.0000 mg | ORAL_TABLET | ORAL | Status: DC | PRN

## 2014-05-25 MED ORDER — GLUCERNA SHAKE PO LIQD: 237.0000 mL | Freq: Two times a day (BID) | ORAL | Status: DC

## 2014-05-25 NOTE — Progress Notes (Signed)
[facility]. [facility] To transport patient at this time. Report called by West BaliMary Anne, RN.

## 2014-05-25 NOTE — Discharge Planning (Addendum)
Patient to be discharged to [facility]. Patient and patient's husband updated at bedside.  LOG approved by Pensions consultant.   Facility: [facility] Report number: [id number] Transportation: EMS ([facility])  UPDATE 4:15pm: [facility] admissions liaison informed CSW patient only able to be admitted on 05/25/2014 if [facility] able to provide patient's evening medications, stating patient will have morning medications provided by facility. CSW spoke with 5N Charge RN who stated patient's medication (with the exception of patient's pain medication) could be provided. CSW met with patient and discussed situation above. Patient understanding and agreeable to receiving patient's pain medication prior to discharging and waiting until morning for another dose of pain medication. Patient expressed gratitude to CSW and 5N staff for assisting patient to admit to [facility] on 05/25/2014. CSW updated both 5N Charge RN and [facility] admissions liaison regarding patient's discharge medication plan. CSW has called and arranged for EMS ([facility]) transport, 5N Charge RN aware.  Lubertha Sayres, [place] ([phone]) Licensed Clinical Social Worker Orthopedics [phone]) and Surgical [phone])

## 2014-05-25 NOTE — Care Management Note (Signed)
    Page 1 of 1   05/25/2014     8:35:36 AM CARE MANAGEMENT NOTE 05/25/2014  Patient:  Molly [name],Molly [name]   Account Number:  [id number]  Date Initiated:  05/25/2014  Documentation initiated by:  Elmer BalesOBARGE,Avaleigh Decuir  Subjective/Objective Assessment:   Patient was admitted with ischemic toes, foot ulcer.  Lives at home with spouse.     Action/Plan:   Will follow for discharge needs.   Anticipated DC Date:     Anticipated DC Plan:  SKILLED NURSING FACILITY  In-house referral  Clinical Social Worker         Choice offered to / List presented to:             Status of service:   Medicare Important Message given?  YES (If response is "NO", the following Medicare IM given date fields will be blank) Date Medicare IM given:  05/24/2014 Medicare IM given by:  Elmer BalesOBARGE,Champayne Kocian Date Additional Medicare IM given:   Additional Medicare IM given by:    Discharge Disposition:    Per UR Regulation:  Reviewed for med. necessity/level of care/duration of stay  If discussed at Long Length of Stay Meetings, dates discussed:    Comments:  05/24/14 1535 Elmer Balesourtney Cashmere Harmes RN, MSN, CM- Medicare IM letter provided.

## 2014-05-25 NOTE — Discharge Instructions (Addendum)
Change your dressing daily with dry gauze and ace bandage.  You may shower and use soap and water, but no soaking.  Bone and Joint Infections Joint infections are called septic or infectious arthritis. An infected joint may damage cartilage and tissue very quickly. This may destroy the joint. Bone infections (osteomyelitis) may last for years. Joints may become stiff if left untreated. Bacteria are the most common cause. Other causes include viruses and fungi, but these are more rare. Bone and joint infections usually come from injury or infection elsewhere in your body; the germs are carried to your bones or joints through the bloodstream.  CAUSES   Blood-carried germs from an infection elsewhere in your body can eventually spread to a bone or joint. The germ staphylococcus is the most common cause of both osteomyelitis and septic arthritis.  An injury can introduce germs into your bones or joints. SYMPTOMS   Weight loss.  Tiredness.  Chills and fever.  Bone or joint pain at rest and with activity.  Tenderness when touching the area or bending the joint.  Refusal to bear weight on a leg or inability to use an arm due to pain.  Decreased range of motion in a joint.  Skin redness, warmth, and tenderness.  Open skin sores and drainage. RISK FACTORS Children, the elderly, and those with weak immune systems are at increased risk of bone and joint infections. It is more common in people with HIV infections and with people on chemotherapy. People are also at increased risk if they have surgery where metal implants are used to stabilize the bone. Plates, screws, or artificial joints provide a surface that bacteria can stick on. Such a growth of bacteria is called biofilm. The biofilm protects bacteria from antibiotics and bodily defenses. This allows germs to multiply. Other reasons for increased risks include:   Having previous surgery or injury of a bone or joint.  Being on high-dose  corticosteroids and immunosuppressive medications that weaken your body's resistance to germs.  Diabetes and long-standing diseases.  Use of intravenous street drugs.  Being on hemodialysis.  Having a history of urinary tract infections.  Removal of your spleen (splenectomy). This weakens your immunity.  Chronic viral infections such as HIV or AIDS.  Lack of sensation such as paraplegia, quadriplegia, or spina bifida. DIAGNOSIS   Increased numbers of white blood cells in your blood may indicate infection. Some times your caregivers are able to identify the infecting germs by testing your blood. Inflammatory markers present in your bloodstream such as an erythrocyte sedimentation rate (ESR or sed rate) or c-reactive protein (CRP) can be indicators of deep infection.  Bone scans and X-ray exams are necessary for diagnosing osteomyelitis. They may help your caregiver find the infected areas. Other studies may give more detailed information. They may help detect fluid collections around a joint, abnormal bone surfaces, or be useful in diagnosing septic arthritis. They can find soft tissue swelling and find excess fluid in an infected joint or the adjacent bone. These tests include:  Ultrasound.  CT (computerized tomography).  MRI (magnetic resonance imaging).  The best test for diagnosing a bone or joint infection is an aspiration or biopsy. Your caregiver will usually use a local anesthetic. He or she can then remove tissue from a bone injury or use a needle to take fluids from an infected joint. A local anesthetic medication numbs the area to be biopsied. Often biopsies are done in the operating room under general anesthesia. This means you  will be asleep during the procedure. Tests performed on these samples can identify an infection. TREATMENT   Treatment can help control long-standing infections, but infections may come back.  Infections can infect any bone or joint at any  age.  Bone and joint infections are rarely fatal.  Bone infection left untreated can become a never-ending infection. It can spread to other areas of your body. It may eventually cause bone death. Reduced limb or joint function can result. In severe cases, this may require removal of a limb. Spinal osteomyelitis is very dangerous. Untreated, it may damage spinal nerves and cause death.  The most common complication of septic arthritis is osteoarthritis with pain and decreased range of motion of the joint. Some forms of treatment may include:  If the infection is caused by bacteria, it is generally treated with antibiotics. You will likely receive the drugs through a vein (intravenously) for anywhere from 2 to 6 weeks. In some cases, especially with children, oral antibiotics following an initial intravenous dose may be effective. The treatment you receive depends on the:  Type of bacteria.  Location of the infection.  Type of surgery that might be done.  Other health conditions or issues you might have.  Your caregiver may drain soft tissue abscesses or pockets of fluid around infected bones or joints. If you have septic arthritis, your caregiver may use a needle to drain pus from the joint on a daily basis. He or she may use an arthroscope to clean the joint or may need to open the joint surgically to remove damaged tissue and infection. An arthroscope is an instrument like a thin lighted telescope. It can be used to look inside the joint.  Surgery is usually needed if the infection has become long-standing. It may also be needed if there is hardware (such as metal plates, screws, or artificial joints) inside the patient. Sometimes a bone or muscle graft is needed to fill in the open space. This promotes growth of new tissues and better blood flow to the area. PREVENTION   Clean and disinfect wounds quickly to help prevent the start of a bone or joint infection. Get treatment for any  infections to prevent spread to a bone or joint.  Do not smoke. Smoking decreases healing rates of bone and predisposes to infection.  When given medications that suppress your immune system, use them according to your caregiver's instructions. Do not take more than prescribed for your condition.  Take good care of your feet and skin, especially if you have diabetes, decreased sensation or circulation problems. SEEK IMMEDIATE MEDICAL CARE IF:   You cannot bear weight on a leg or use an arm, especially following a minor injury. This can be a sign of bone or joint infection.  You think you may have signs or symptoms of a bone or joint infection. Your chance of getting rid of an infection is better if treated early. Document Released: 03/16/2005 Document Revised: 06/08/2011 Document Reviewed: 02/13/2009 [facility] Patient Information 2015 [place], [place]. This information is not intended to replace advice given to you by your health care provider. Make sure you discuss any questions you have with your health care provider.  Diabetes and Foot Care Diabetes may cause you to have problems because of poor blood supply (circulation) to your feet and legs. This may cause the skin on your feet to become thinner, break easier, and heal more slowly. Your skin may become dry, and the skin may peel and crack. You  may also have nerve damage in your legs and feet causing decreased feeling in them. You may not notice minor injuries to your feet that could lead to infections or more serious problems. Taking care of your feet is one of the most important things you can do for yourself.  HOME CARE INSTRUCTIONS  Wear shoes at all times, even in the house. Do not go barefoot. Bare feet are easily injured.  Check your feet daily for blisters, cuts, and redness. If you cannot see the bottom of your feet, use a mirror or ask someone for help.  Wash your feet with warm water (do not use hot water) and mild soap. Then pat  your feet and the areas between your toes until they are completely dry. Do not soak your feet as this can dry your skin.  Apply a moisturizing lotion or petroleum jelly (that does not contain alcohol and is unscented) to the skin on your feet and to dry, brittle toenails. Do not apply lotion between your toes.  Trim your toenails straight across. Do not dig under them or around the cuticle. File the edges of your nails with an emery board or nail file.  Do not cut corns or calluses or try to remove them with medicine.  Wear clean socks or stockings every day. Make sure they are not too tight. Do not wear knee-high stockings since they may decrease blood flow to your legs.  Wear shoes that fit properly and have enough cushioning. To break in new shoes, wear them for just a few hours a day. This prevents you from injuring your feet. Always look in your shoes before you put them on to be sure there are no objects inside.  Do not cross your legs. This may decrease the blood flow to your feet.  If you find a minor scrape, cut, or break in the skin on your feet, keep it and the skin around it clean and dry. These areas may be cleansed with mild soap and water. Do not cleanse the area with peroxide, alcohol, or iodine.  When you remove an adhesive bandage, be sure not to damage the skin around it.  If you have a wound, look at it several times a day to make sure it is healing.  Do not use heating pads or hot water bottles. They may burn your skin. If you have lost feeling in your feet or legs, you may not know it is happening until it is too late.  Make sure your health care provider performs a complete foot exam at least annually or more often if you have foot problems. Report any cuts, sores, or bruises to your health care provider immediately. SEEK MEDICAL CARE IF:   You have an injury that is not healing.  You have cuts or breaks in the skin.  You have an ingrown nail.  You notice  redness on your legs or feet.  You feel burning or tingling in your legs or feet.  You have pain or cramps in your legs and feet.  Your legs or feet are numb.  Your feet always feel cold. SEEK IMMEDIATE MEDICAL CARE IF:   There is increasing redness, swelling, or pain in or around a wound.  There is a red line that goes up your leg.  Pus is coming from a wound.  You develop a fever or as directed by your health care provider.  You notice a bad smell coming from an ulcer or wound.  Document Released: 03/13/2000 Document Revised: 11/16/2012 Document Reviewed: 08/23/2012 [facility] Patient Information 2015 [place], [place]. This information is not intended to replace advice given to you by your health care provider. Make sure you discuss any questions you have with your health care provider.

## 2014-05-25 NOTE — Progress Notes (Addendum)
Subjective: 3 Days Post-Op Procedure(s) (LRB): AMPUTATION RAY LEFT FOURTH AND FIFTH  (Left) Patient reports pain as mild.    Objective: Vital signs in last 24 hours: Temp:  [98.2 F (36.8 C)-98.3 F (36.8 C)] 98.2 F (36.8 C) (02/26 0627) Pulse Rate:  [102-108] 102 (02/26 0627) Resp:  [18-20] 20 (02/26 0627) BP: (144-158)/(55-74) 156/68 mmHg (02/26 0627) SpO2:  [92 %-97 %] 92 % (02/26 0627)  Intake/Output from previous day: 02/25 0701 - 02/26 0700 In: 1560 [P.O.:1110; IV Piggyback:450] Out: -  Intake/Output this shift:     Recent Labs  05/23/14 0534  HGB 10.0*    Recent Labs  05/23/14 0534  WBC 11.7*  RBC 3.45*  HCT 32.5*  PLT 358    Recent Labs  05/23/14 0534  NA 134*  K 4.0  CL 102  CO2 28  BUN 13  CREATININE 1.01  GLUCOSE 248*  CALCIUM 7.8*   No results for input(s): LABPT, INR in the last 72 hours.  PE:  Left foot incision with slight maceration.  No cellulitis or purulence.  Assessment/Plan: 3 Days Post-Op Procedure(s) (LRB): AMPUTATION RAY LEFT FOURTH AND FIFTH  (Left) Pt can be dischargd at any time from my perspective.  F/u with me in the office i a couple of weeks.  Epic f/u info entered.  WBAT in post op shoe for transfers only.    I;ll sign off.  pls call with any questions.  [phone].  Toni ArthursHEWITT, Robynn Marcel 05/25/2014, 7:33 AM

## 2014-05-25 NOTE — Discharge Summary (Addendum)
Physician Discharge Summary  Molly HughsBrenda P [name] WUJ:[id number] DOB: April 06, 1945 DOA: 05/21/2014  PCP: Lennart Palloque, Jodi M, MD  Admit date: 05/21/2014 Discharge date: 05/25/2014  Recommendations for Outpatient Follow-up:  1. Continue Augmentin and doxycycline for 10 days of discharge. Will defer to orthopedic surgery was to do dressing changes if the wound healing appropriately that 10 days would be enough but if wound is not healing appropriately that patient may need longer course of antibiotics. 2. Would continue to hold metformin on discharge because of recent surgery, infection. Patient will continue taking insulin on discharge.  Discharge Diagnoses:  Principal Problem:   Cellulitis of left foot Active Problems:   Diabetes mellitus   Chronic anemia   Osteomyelitis of left foot   CKD (chronic kidney disease) stage 3, GFR 30-59 ml/min    Discharge Condition: stable   Diet recommendation: as tolerated   History of present illness:  69 year old obese female with history of uncontrolled diabetes mellitus with chronic left foot wound (left fifth metatarsal ulceration for several months) who presented to [facility] with worsening drainage and foul-smelling from foot for past one week prior to this admission. Patient was on outpatient treatment with clindamycin with no significant improvement. On admission, patient was found to have a left fifth metatarsal osteomyelitis. Patient underwent left fourth and fifth ray amputation by orthopedic surgery on 05/22/2014. Patient was started on broad-spectrum antibiotics, vanco and aztreonam.  Assessment/Plan:    Principal Problem: Acute left fifth metatarsal osteomyelitis secondary to uncontrolled diabetes / leukocytosis - X ray of the left foot on 05/21/2014 showed abnormal gas in the soft tissues adjacent to the fifth MTP joint and the fifth metatarsal, with low-level periostitis/periosteal reaction in the fifth metatarsal which could be an indicator of early  osteomyelitis. - Patient is status post fourth and fifth ray amputation on 05/22/2014 by orthopedic surgery. - Pt was started on broad spectrum abx, vancomycin and aztreonam. Blood cultures to date are negative. Since we don't have wound culture we will treat empirically on discharge with Augmentin and doxycycline for 10 days. I spoke with infectious disease on-call Dr. Cliffton AstersJohn Campbell who recommended that when orthopedic surgery reassesses the wound and if it is healing properly then 10 days of abx should be enough. If wound is not healing properly. Patient will need longer course of antibiotics.  Active problems:  Uncontrolled type 2 diabetes mellitus with peripheral vascular disease - A1c on admission is 9.1 indicating poor glycemic control.  - Patient will continue NovoLog 70/30 mix 100 units twice daily along with sliding scale insulin. Metformin is on hold because of acute infection.  Anemia of chronic kidney disease - Hemoglobin is 10, stable. - No current indications for transfusion.  Chronic kidney disease stage III - Creatinine normalized with IV fluids.   History of stroke with residual mild left-sided weakness - Continue Plavix on discharge  Essential hypertension  - Continue lisinopril and losartan on discharge.  Obesity - Counseled on diet and exercise.   DVT Prophylaxis  - Lovenox subcutaneous ordered patient is in hospital.   Code Status: Full.  Family Communication: plan of care discussed with the patient   IV access:  Peripheral IV  Procedures and diagnostic studies:   Dg Foot Complete Left 05/21/2014 1. Abnormal gas in the soft tissues adjacent to the fifth MTP joint and the fifth metatarsal, with low-level periostitis/periosteal reaction in the fifth metatarsal which could be an indicator of early osteomyelitis. However, overt bony destructive findings are not observed. 2. Calcaneal spurring  and dorsal midfoot spurring. 3. The fifth MTP joint also  appears wider than it did previously - joint effusion and septic joint not excluded although definite gas is not observed within the joint itself.   Left fourth and fifth ray amputations 05/22/2014  ABI 05/22/2014 RT TBI (0.71) within lower limits of normal. (>0.70) LT TBI (0.37) within severe category of disease (0.30 - 0.39)  Medical Consultants:  Orthopedic surgery   Other Consultants:  Diabetic coordinator  IAnti-Infectives:   Vancomycin and aztreonam 05/21/2014 --> 05/25/2014  Augmentin and doxycycline on discharge for 10 days.    SignedManson Passey, MD  [facility] 05/25/2014, 10:01 AM  Pager #: [phone]   Discharge Exam: Filed Vitals:   05/25/14 0627  BP: 156/68  Pulse: 102  Temp: 98.2 F (36.8 C)  Resp: 20   Filed Vitals:   05/24/14 1300 05/24/14 1800 05/24/14 2014 05/25/14 0627  BP: 144/55 158/74 150/69 156/68  Pulse: 104 108 105 102  Temp: 98.2 F (36.8 C) 98.3 F (36.8 C) 98.3 F (36.8 C) 98.2 F (36.8 C)  TempSrc:  Oral    Resp: Height:      Weight:      SpO2: 97% 92% 92% 92%    General: Pt is not in acute distress Cardiovascular: Regular rhythm and rate, S1/S2 appreciated  Respiratory: no wheezing, bilateral air entry  Abdominal: Soft, non tender, non distended, bowel sounds +, no guarding Extremities: left foot dressing in place, pulses palpable RLE Neuro: Grossly nonfocal  Discharge Instructions  Discharge Instructions    Call MD for:  difficulty breathing, headache or visual disturbances    Complete by:  As directed      Call MD for:  persistant nausea and vomiting    Complete by:  As directed      Call MD for:  severe uncontrolled pain    Complete by:  As directed      Diet - low sodium heart healthy    Complete by:  As directed      Discharge instructions    Complete by:  As directed   1. Continue Augmentin and doxycycline for 10 days of discharge. Will defer to orthopedic surgery was to do  dressing changes if the wound healing appropriately that 10 days would be enough but if wound is not healing appropriately that patient may need longer course of antibiotics. 2. Would continue to hold metformin on discharge because of recent surgery, infection. Patient will continue taking insulin on discharge.     Increase activity slowly    Complete by:  As directed      Weight bearing as tolerated    Complete by:  As directed   In the post op shoe for tranfers only.  Laterality:  left  Extremity:  Lower            Medication List    STOP taking these medications        cephALEXin 500 MG capsule  Commonly known as:  KEFLEX     ciprofloxacin 500 MG tablet  Commonly known as:  CIPRO     clindamycin 150 MG capsule  Commonly known as:  CLEOCIN     collagenase ointment  Commonly known as:  SANTYL     insulin aspart protamine - aspart (70-30) 100 UNIT/ML FlexPen  Commonly known as:  NOVOLOG 70/30 MIX  Replaced by:  insulin aspart protamine- aspart (70-30) 100 UNIT/ML injection     metFORMIN  500 MG 24 hr tablet  Commonly known as:  GLUCOPHAGE-XR      TAKE these medications        acetaminophen 325 MG tablet  Commonly known as:  TYLENOL  Take 2 tablets (650 mg total) by mouth every 6 (six) hours as needed for mild pain (or Fever >/= 101).     alum & mag hydroxide-simeth 200-200-20 MG/5ML suspension  Commonly known as:  MAALOX/MYLANTA  Take 30 mLs by mouth every 6 (six) hours as needed for indigestion or heartburn (dyspepsia).     amitriptyline 25 MG tablet  Commonly known as:  ELAVIL  Take 25 mg by mouth at bedtime.     amoxicillin-clavulanate 875-125 MG per tablet  Commonly known as:  AUGMENTIN  Take 1 tablet by mouth 2 (two) times daily.     clopidogrel 75 MG tablet  Commonly known as:  PLAVIX  Take 75 mg by mouth daily.     doxycycline 100 MG tablet  Commonly known as:  ADOXA  Take 1 tablet (100 mg total) by mouth 2 (two) times daily.     feeding  supplement (GLUCERNA SHAKE) Liqd  Take 237 mLs by mouth 2 (two) times daily between meals.     insulin aspart 100 UNIT/ML injection  Commonly known as:  novoLOG  Inject 0-20 Units into the skin 3 (three) times daily with meals.     insulin aspart protamine- aspart (70-30) 100 UNIT/ML injection  Commonly known as:  NOVOLOG MIX 70/30  Inject 1 mL (100 Units total) into the skin 2 (two) times daily with a meal.     lisinopril 10 MG tablet  Commonly known as:  PRINIVIL,ZESTRIL  Take 10 mg by mouth daily.     losartan 50 MG tablet  Commonly known as:  COZAAR  Take 50 mg by mouth daily.     oxyCODONE 5 MG immediate release tablet  Commonly known as:  Oxy IR/ROXICODONE  Take 1 tablet (5 mg total) by mouth every 4 (four) hours as needed for moderate pain.     polyethylene glycol packet  Commonly known as:  MIRALAX / GLYCOLAX  Take 17 g by mouth daily.           Follow-up Information    Follow up with HEWITT, Jonny Ruiz, MD. Schedule an appointment as soon as possible for a visit in 2 weeks.   Specialty:  Orthopedic Surgery   Contact information:   [address] [place] [address] [phone]       Follow up with Lennart Pall, MD. Schedule an appointment as soon as possible for a visit in 2 weeks.   Specialty:  Internal Medicine   Why:  Follow up appt after recent hospitalization   Contact information:   [facility] [address] [place] [address] [phone]        The results of significant diagnostics from this hospitalization (including imaging, microbiology, ancillary and laboratory) are listed below for reference.    Significant Diagnostic Studies: Dg Foot Complete Left  05/22/2014   3 views of a skeletally mature individual were obtained of the left foot.  Study includes AP, oblique, lateral projections.  Soft tissue ulcerations 5 on the plantar aspect of submetatarsal 5. There  is no definitive cortical changes suggest  osteomyelitis at this time.  There is no soft tissue emphysema. Overall increase in calcaneal  inclination angle. Plantar calcaneal heel spurs benefit. Flexion  contractures of the digits.  Dg  Foot Complete Left  05/21/2014   CLINICAL DATA:  Wound infection with pain along the left foot. Diabetes.  EXAM: LEFT FOOT - COMPLETE 3+ VIEW  COMPARISON:  05/08/2014  FINDINGS: Abnormal gas tracks in the soft tissues of the lateral left foot adjacent to the fifth MTP joint and is the distal half of the fifth metatarsal. Possible ulceration overlying the fifth MTP joint, which appears slightly widened compared hilum. Previously. There is some periosteal reaction along the fifth metatarsal which is new compared to the prior exam. No overt bony destructive findings.  Large plantar and Achilles calcaneal spurs noted. Dorsal midfoot spurring is present along with dorsal spurring at the Lisfranc joint.  IMPRESSION: 1. Abnormal gas in the soft tissues adjacent to the fifth MTP joint and the fifth metatarsal, with low-level periostitis/periosteal reaction in the fifth metatarsal which could be an indicator of early osteomyelitis. However, overt bony destructive findings are not observed. 2. Calcaneal spurring and dorsal midfoot spurring. 3. The fifth MTP joint also appears wider than it did previously - joint effusion and septic joint not excluded although definite gas is not observed within the joint itself.   Electronically Signed   By: Gaylyn Rong M.D.   On: 05/21/2014 18:25    Microbiology: Recent Results (from the past 240 hour(s))  Blood Cultures x 2 sites     Status: None (Preliminary result)   Collection Time: 05/21/14 11:25 PM  Result Value Ref Range Status   Specimen Description BLOOD LEFT ARM  Final   Special Requests BOTTLES DRAWN AEROBIC ONLY 10CC  Final   Culture   Final           BLOOD CULTURE RECEIVED NO GROWTH TO DATE CULTURE WILL BE HELD FOR 5 DAYS BEFORE ISSUING A FINAL NEGATIVE  REPORT Performed at [facility]    Report Status PENDING  Incomplete  Blood Cultures x 2 sites     Status: None (Preliminary result)   Collection Time: 05/21/14 11:36 PM  Result Value Ref Range Status   Specimen Description BLOOD LEFT HAND  Final   Special Requests BOTTLES DRAWN AEROBIC ONLY 8CC  Final   Culture   Final           BLOOD CULTURE RECEIVED NO GROWTH TO DATE CULTURE WILL BE HELD FOR 5 DAYS BEFORE ISSUING A FINAL NEGATIVE REPORT Performed at [facility]    Report Status PENDING  Incomplete  Surgical pcr screen     Status: Abnormal   Collection Time: 05/22/14  3:16 PM  Result Value Ref Range Status   MRSA, PCR NEGATIVE NEGATIVE Final   Staphylococcus aureus POSITIVE (A) NEGATIVE Final    Comment:        The Xpert SA Assay (FDA approved for NASAL specimens in patients over 51 years of age), is one component of a comprehensive surveillance program.  Test performance has been validated by [facility] for patients greater than or equal to 52 year old. It is not intended to diagnose infection nor to guide or monitor treatment.      Labs: Basic Metabolic Panel:  Recent Labs Lab 05/21/14 1738 05/22/14 0614 05/23/14 0534  NA 131* 134* 134*  K 4.2 4.0 4.0  CL 94* 98 102  CO2 27 30 28   GLUCOSE 376* 293* 248*  BUN 13 13 13   CREATININE 1.16* 0.99 1.01  CALCIUM 8.7 8.4 7.8*   Liver Function Tests:  Recent Labs Lab 05/21/14 1738 05/22/14 0614  AST 24 17  ALT 20 18  ALKPHOS 78 66  BILITOT 0.5 0.6  PROT 7.3 6.6  ALBUMIN 2.8* 2.4*   No results for input(s): LIPASE, AMYLASE in the last 168 hours. No results for input(s): AMMONIA in the last 168 hours. CBC:  Recent Labs Lab 05/21/14 1738 05/22/14 0614 05/23/14 0534  WBC 13.6* 9.4 11.7*  NEUTROABS 9.9*  --   --   HGB 11.5* 10.6* 10.0*  HCT 35.9* 33.6* 32.5*  MCV 92.8 92.3 94.2  PLT 331 304 358   Cardiac Enzymes: No results for input(s): CKTOTAL, CKMB, CKMBINDEX, TROPONINI in the  last 168 hours. BNP: BNP (last 3 results) No results for input(s): BNP in the last 8760 hours.  ProBNP (last 3 results) No results for input(s): PROBNP in the last 8760 hours.  CBG:  Recent Labs Lab 05/24/14 0650 05/24/14 1118 05/24/14 1629 05/24/14 2131 05/25/14 0641  GLUCAP 121* 275* 203* 184* 124*    Time coordinating discharge: Over 30 minutes

## 2014-05-28 LAB — CULTURE, BLOOD (ROUTINE X 2)
Culture: NO GROWTH
Culture: NO GROWTH

## 2014-05-31 ENCOUNTER — Telehealth: Payer: Self-pay | Admitting: Podiatry

## 2014-05-31 NOTE — Telephone Encounter (Signed)
[name] called the office inquiring about her post-operative course and when the sutures were going to be removed. I recommended her to call Dr. Victorino DikeHewitt, who preformed the surgery for follow-up. I discussed with her that I can continue to see her was well for her other foot issues. She understands.

## 2014-06-29 ENCOUNTER — Encounter: Payer: Self-pay | Admitting: Surgery

## 2014-06-29 ENCOUNTER — Encounter
Admit: 2014-06-29 | Disposition: A | Discharge status: Home / Self Care | Payer: Self-pay | Attending: Surgery | Admitting: Surgery

## 2014-07-09 ENCOUNTER — Encounter (HOSPITAL_BASED_OUTPATIENT_CLINIC_OR_DEPARTMENT_OTHER): Payer: Medicare HMO | Attending: Plastic Surgery

## 2014-07-09 DIAGNOSIS — B9689 Other specified bacterial agents as the cause of diseases classified elsewhere: Secondary | ICD-10-CM | POA: Insufficient documentation

## 2014-07-09 DIAGNOSIS — Z89422 Acquired absence of other left toe(s): Secondary | ICD-10-CM | POA: Insufficient documentation

## 2014-07-09 DIAGNOSIS — L97522 Non-pressure chronic ulcer of other part of left foot with fat layer exposed: Secondary | ICD-10-CM | POA: Diagnosis not present

## 2014-07-09 DIAGNOSIS — Z794 Long term (current) use of insulin: Secondary | ICD-10-CM | POA: Diagnosis not present

## 2014-07-09 DIAGNOSIS — E11621 Type 2 diabetes mellitus with foot ulcer: Secondary | ICD-10-CM | POA: Insufficient documentation

## 2014-07-09 DIAGNOSIS — M868X7 Other osteomyelitis, ankle and foot: Secondary | ICD-10-CM | POA: Diagnosis not present

## 2014-07-09 DIAGNOSIS — T814XXA Infection following a procedure, initial encounter: Secondary | ICD-10-CM | POA: Diagnosis not present

## 2014-07-09 DIAGNOSIS — E1151 Type 2 diabetes mellitus with diabetic peripheral angiopathy without gangrene: Secondary | ICD-10-CM | POA: Insufficient documentation

## 2014-07-09 DIAGNOSIS — E1140 Type 2 diabetes mellitus with diabetic neuropathy, unspecified: Secondary | ICD-10-CM | POA: Insufficient documentation

## 2014-07-09 DIAGNOSIS — Y835 Amputation of limb(s) as the cause of abnormal reaction of the patient, or of later complication, without mention of misadventure at the time of the procedure: Secondary | ICD-10-CM | POA: Insufficient documentation

## 2014-07-09 DIAGNOSIS — S91302A Unspecified open wound, left foot, initial encounter: Secondary | ICD-10-CM | POA: Diagnosis present

## 2014-07-16 DIAGNOSIS — E11621 Type 2 diabetes mellitus with foot ulcer: Secondary | ICD-10-CM | POA: Diagnosis not present

## 2014-07-16 DIAGNOSIS — S91302A Unspecified open wound, left foot, initial encounter: Secondary | ICD-10-CM | POA: Diagnosis not present

## 2014-07-16 DIAGNOSIS — T814XXA Infection following a procedure, initial encounter: Secondary | ICD-10-CM | POA: Diagnosis not present

## 2014-07-16 DIAGNOSIS — L97522 Non-pressure chronic ulcer of other part of left foot with fat layer exposed: Secondary | ICD-10-CM | POA: Diagnosis not present

## 2014-07-24 ENCOUNTER — Encounter: Payer: Self-pay | Admitting: Surgery

## 2014-07-24 ENCOUNTER — Other Ambulatory Visit: Payer: Self-pay

## 2014-07-24 DIAGNOSIS — I70249 Atherosclerosis of native arteries of left leg with ulceration of unspecified site: Secondary | ICD-10-CM

## 2014-07-25 ENCOUNTER — Encounter: Payer: Self-pay | Admitting: Surgery

## 2014-07-25 ENCOUNTER — Ambulatory Visit (HOSPITAL_COMMUNITY)
Admission: RE | Admit: 2014-07-25 | Discharge: 2014-07-25 | Disposition: A | Discharge status: Home / Self Care | Payer: Medicare HMO | Source: Ambulatory Visit | Attending: Surgery | Admitting: Surgery

## 2014-07-25 ENCOUNTER — Ambulatory Visit (INDEPENDENT_AMBULATORY_CARE_PROVIDER_SITE_OTHER): Payer: Medicare HMO | Admitting: Surgery

## 2014-07-25 VITALS — BP 136/78 | HR 97 | Ht 66.0 in | Wt 255.0 lb

## 2014-07-25 DIAGNOSIS — I70249 Atherosclerosis of native arteries of left leg with ulceration of unspecified site: Secondary | ICD-10-CM | POA: Insufficient documentation

## 2014-07-25 DIAGNOSIS — L97524 Non-pressure chronic ulcer of other part of left foot with necrosis of bone: Secondary | ICD-10-CM

## 2014-07-25 NOTE — Progress Notes (Signed)
Patient name: Molly [name] MRN: [id number] DOB: Nov 07, 1945 Sex: female   Referred by: Wound center  Reason for referral:  Chief Complaint  Patient presents with  . New Evaluation    eval non healing wound L foot    HISTORY OF PRESENT ILLNESS: This is a 69 year old female who comes in today for evaluation of a left foot wound.  She presented to the hospital in February with a abscess spreading to the bone.  She underwent amputation of her left fourth and fifth digits.  She has had problems with healing.  She was on antibiotics for 2 weeks after her surgery.  Approximately 2 weeks ago a wound VAC was placed and she reports significant improvement in the filling in of the cavity in her foot.  She denies fevers or chills.  The patient has a history of stroke.  She is unsure of the etiology.  She states that it is secondary to a blood clot.  She states she has a small hole in her heart.  She is on aspirin and Plavix.  She has weakness on the left side.  The patient is a diabetic on insulin.  Her last A1c was 9.1.  Past Medical History  Diagnosis Date  . Diabetes   . Stroke 2008    left weakness  . Hemorrhoid   . COPD (chronic obstructive pulmonary disease)     Past Surgical History  Procedure Laterality Date  . Colonoscopy  2004  . Cataract extraction    . Amputation Left 05/22/2014    Procedure: AMPUTATION RAY LEFT FOURTH AND FIFTH ;  Surgeon: Toni Arthurs, MD;  Location: [facility] OR;  Service: Orthopedics;  Laterality: Left;    History   Social History  . Marital Status: Married    Spouse Name: N/A  . Number of Children: N/A  . Years of Education: N/A   Occupational History  . Not on file.   Social History Main Topics  . Smoking status: Never Smoker   . Smokeless tobacco: Not on file  . Alcohol Use: No  . Drug Use: Not on file  . Sexual Activity: Not on file   Other Topics Concern  . Not on file   Social History Narrative    Family History  Problem Relation  Age of Onset  . Hyperlipidemia Mother   . Varicose Veins Mother   . Deep vein thrombosis Father     Allergies as of 07/25/2014  . (No Known Allergies)    Current Outpatient Prescriptions on File Prior to Visit  Medication Sig Dispense Refill  . amitriptyline (ELAVIL) 25 MG tablet Take 50 mg by mouth at bedtime.     . clopidogrel (PLAVIX) 75 MG tablet Take 75 mg by mouth daily.     Marland Kitchen losartan (COZAAR) 50 MG tablet Take 50 mg by mouth daily.     Marland Kitchen acetaminophen (TYLENOL) 325 MG tablet Take 2 tablets (650 mg total) by mouth every 6 (six) hours as needed for mild pain (or Fever >/= 101). (Patient not taking: Reported on 07/25/2014) 30 tablet 0  . alum & mag hydroxide-simeth (MAALOX/MYLANTA) 200-200-20 MG/5ML suspension Take 30 mLs by mouth every 6 (six) hours as needed for indigestion or heartburn (dyspepsia). (Patient not taking: Reported on 07/25/2014) 355 mL 0  . amoxicillin-clavulanate (AUGMENTIN) 875-125 MG per tablet Take 1 tablet by mouth 2 (two) times daily. (Patient not taking: Reported on 07/25/2014) 20 tablet 0  . doxycycline (ADOXA) 100 MG tablet Take 1  tablet (100 mg total) by mouth 2 (two) times daily. (Patient not taking: Reported on 07/25/2014) 20 tablet 0  . feeding supplement, GLUCERNA SHAKE, (GLUCERNA SHAKE) LIQD Take 237 mLs by mouth 2 (two) times daily between meals. (Patient not taking: Reported on 07/25/2014) 237 mL 0  . insulin aspart (NOVOLOG) 100 UNIT/ML injection Inject 0-20 Units into the skin 3 (three) times daily with meals. 10 mL 11  . insulin aspart protamine- aspart (NOVOLOG MIX 70/30) (70-30) 100 UNIT/ML injection Inject 1 mL (100 Units total) into the skin 2 (two) times daily with a meal. 10 mL 11  . lisinopril (PRINIVIL,ZESTRIL) 10 MG tablet Take 10 mg by mouth daily.     Marland Kitchen. oxyCODONE (OXY IR/ROXICODONE) 5 MG immediate release tablet Take 1 tablet (5 mg total) by mouth every 4 (four) hours as needed for moderate pain. (Patient not taking: Reported on 07/25/2014) 30  tablet 0  . polyethylene glycol (MIRALAX / GLYCOLAX) packet Take 17 g by mouth daily.     No current facility-administered medications on file prior to visit.     REVIEW OF SYSTEMS: Cardiovascular: No chest pain, chest pressure, palpitations, orthopnea, or dyspnea on exertion. No claudication or rest pain,  positive for leg swelling Pulmonary: No productive cough or wheezing.  Positive for asthma Neurologic: History of stroke with left-sided weakness Hematologic: No bleeding problems or clotting disorders. Musculoskeletal: No joint pain or joint swelling. Gastrointestinal: No blood in stool or hematemesis Genitourinary: No dysuria or hematuria. Psychiatric:: No history of major depression. Integumentary: No rashes or ulcers. Constitutional: No fever or chills.  PHYSICAL EXAMINATION:  Filed Vitals:   07/25/14 0955 07/25/14 1005  BP: 142/76 136/78  Pulse: 97   Height: 5\' 6"  (1.676 m)   Weight: 255 lb (115.667 kg)   SpO2: 96%    Body mass index is 41.18 kg/(m^2). General: The patient appears their stated age.   HEENT:  No gross abnormalities Pulmonary: Respirations are non-labored Abdomen: Soft and non-tender  Musculoskeletal: There are no major deformities.   Neurologic: No focal weakness or paresthesias are detected, Skin: Open wound at the base of the left fourth and fifth ray amputation site.  There is pink granulation tissue which is filling in.  Mild amount of reactive erythema surrounds the wound.  No significant drainage. Psychiatric: The patient has normal affect. Cardiovascular: There is a regular rate and rhythm without significant murmur appreciated.  Cannot palpate pulses.  Diagnostic Studies: Ultrasound was ordered and reviewed today.  Her ABI on the left is 1.4 on the right is 1.4.  Although this is suggestive of medial calcification, her waveforms were all triphasic    Assessment:  Left foot wound Plan: After reviewing her ankle-brachial indices and  waveforms, I do not think that vascular insufficiency is contributing to the slowly healing wound.  I feel that she has adequate blood flow for her wound to heal.  No significant stenosis is identified.  I did discuss the importance of medical management.  We discussed possibly starting a statin for hypercholesterolemia, as well as nonsmoking, and diabetes control.  She will continue with the wound VAC and follow up with me on an as-needed basis.     Jorge NyV. Wells Brabham IV, M.D. [facility] Office: [phone] Pager:  [phone]

## 2014-07-30 ENCOUNTER — Encounter (HOSPITAL_BASED_OUTPATIENT_CLINIC_OR_DEPARTMENT_OTHER): Payer: Medicare HMO | Attending: Plastic Surgery

## 2014-07-30 DIAGNOSIS — Z89422 Acquired absence of other left toe(s): Secondary | ICD-10-CM | POA: Insufficient documentation

## 2014-07-30 DIAGNOSIS — T8744 Infection of amputation stump, left lower extremity: Secondary | ICD-10-CM | POA: Diagnosis not present

## 2014-07-30 DIAGNOSIS — S91302D Unspecified open wound, left foot, subsequent encounter: Secondary | ICD-10-CM | POA: Insufficient documentation

## 2014-07-30 DIAGNOSIS — E11621 Type 2 diabetes mellitus with foot ulcer: Secondary | ICD-10-CM | POA: Insufficient documentation

## 2014-07-30 DIAGNOSIS — E1140 Type 2 diabetes mellitus with diabetic neuropathy, unspecified: Secondary | ICD-10-CM | POA: Insufficient documentation

## 2014-07-30 DIAGNOSIS — L97521 Non-pressure chronic ulcer of other part of left foot limited to breakdown of skin: Secondary | ICD-10-CM | POA: Diagnosis not present

## 2014-07-30 DIAGNOSIS — R609 Edema, unspecified: Secondary | ICD-10-CM | POA: Insufficient documentation

## 2014-07-30 DIAGNOSIS — L97524 Non-pressure chronic ulcer of other part of left foot with necrosis of bone: Secondary | ICD-10-CM | POA: Diagnosis not present

## 2014-08-13 DIAGNOSIS — L97521 Non-pressure chronic ulcer of other part of left foot limited to breakdown of skin: Secondary | ICD-10-CM | POA: Diagnosis not present

## 2014-08-13 DIAGNOSIS — L97524 Non-pressure chronic ulcer of other part of left foot with necrosis of bone: Secondary | ICD-10-CM | POA: Diagnosis not present

## 2014-08-13 DIAGNOSIS — E11621 Type 2 diabetes mellitus with foot ulcer: Secondary | ICD-10-CM | POA: Diagnosis not present

## 2014-08-13 DIAGNOSIS — S91302D Unspecified open wound, left foot, subsequent encounter: Secondary | ICD-10-CM | POA: Diagnosis not present

## 2014-08-20 DIAGNOSIS — S91302D Unspecified open wound, left foot, subsequent encounter: Secondary | ICD-10-CM | POA: Diagnosis not present

## 2014-08-20 DIAGNOSIS — E11621 Type 2 diabetes mellitus with foot ulcer: Secondary | ICD-10-CM | POA: Diagnosis not present

## 2014-08-20 DIAGNOSIS — L97524 Non-pressure chronic ulcer of other part of left foot with necrosis of bone: Secondary | ICD-10-CM | POA: Diagnosis not present

## 2014-08-20 DIAGNOSIS — L97521 Non-pressure chronic ulcer of other part of left foot limited to breakdown of skin: Secondary | ICD-10-CM | POA: Diagnosis not present

## 2014-08-29 ENCOUNTER — Encounter (HOSPITAL_BASED_OUTPATIENT_CLINIC_OR_DEPARTMENT_OTHER): Payer: Medicare HMO | Attending: Plastic Surgery

## 2014-08-29 DIAGNOSIS — M1990 Unspecified osteoarthritis, unspecified site: Secondary | ICD-10-CM | POA: Insufficient documentation

## 2014-08-29 DIAGNOSIS — G4730 Sleep apnea, unspecified: Secondary | ICD-10-CM | POA: Insufficient documentation

## 2014-08-29 DIAGNOSIS — E11621 Type 2 diabetes mellitus with foot ulcer: Secondary | ICD-10-CM | POA: Diagnosis not present

## 2014-08-29 DIAGNOSIS — I10 Essential (primary) hypertension: Secondary | ICD-10-CM | POA: Insufficient documentation

## 2014-08-29 DIAGNOSIS — L97521 Non-pressure chronic ulcer of other part of left foot limited to breakdown of skin: Secondary | ICD-10-CM | POA: Insufficient documentation

## 2014-08-29 DIAGNOSIS — J45909 Unspecified asthma, uncomplicated: Secondary | ICD-10-CM | POA: Insufficient documentation

## 2014-08-29 DIAGNOSIS — X58XXXA Exposure to other specified factors, initial encounter: Secondary | ICD-10-CM | POA: Insufficient documentation

## 2014-08-29 DIAGNOSIS — S51812A Laceration without foreign body of left forearm, initial encounter: Secondary | ICD-10-CM | POA: Insufficient documentation

## 2014-08-29 DIAGNOSIS — L84 Corns and callosities: Secondary | ICD-10-CM | POA: Diagnosis not present

## 2014-08-29 DIAGNOSIS — Z89432 Acquired absence of left foot: Secondary | ICD-10-CM | POA: Insufficient documentation

## 2014-08-29 DIAGNOSIS — E1140 Type 2 diabetes mellitus with diabetic neuropathy, unspecified: Secondary | ICD-10-CM | POA: Diagnosis not present

## 2014-09-03 ENCOUNTER — Encounter (HOSPITAL_BASED_OUTPATIENT_CLINIC_OR_DEPARTMENT_OTHER): Payer: Medicare HMO

## 2014-09-03 DIAGNOSIS — L84 Corns and callosities: Secondary | ICD-10-CM | POA: Diagnosis not present

## 2014-09-03 DIAGNOSIS — E11621 Type 2 diabetes mellitus with foot ulcer: Secondary | ICD-10-CM | POA: Diagnosis not present

## 2014-09-03 DIAGNOSIS — S51812A Laceration without foreign body of left forearm, initial encounter: Secondary | ICD-10-CM | POA: Diagnosis not present

## 2014-09-03 DIAGNOSIS — L97521 Non-pressure chronic ulcer of other part of left foot limited to breakdown of skin: Secondary | ICD-10-CM | POA: Diagnosis not present

## 2014-09-10 DIAGNOSIS — S51812A Laceration without foreign body of left forearm, initial encounter: Secondary | ICD-10-CM | POA: Diagnosis not present

## 2014-09-10 DIAGNOSIS — L97521 Non-pressure chronic ulcer of other part of left foot limited to breakdown of skin: Secondary | ICD-10-CM | POA: Diagnosis not present

## 2014-09-10 DIAGNOSIS — E11621 Type 2 diabetes mellitus with foot ulcer: Secondary | ICD-10-CM | POA: Diagnosis not present

## 2014-09-10 DIAGNOSIS — L84 Corns and callosities: Secondary | ICD-10-CM | POA: Diagnosis not present

## 2014-09-17 DIAGNOSIS — S51812A Laceration without foreign body of left forearm, initial encounter: Secondary | ICD-10-CM | POA: Diagnosis not present

## 2014-09-17 DIAGNOSIS — E11621 Type 2 diabetes mellitus with foot ulcer: Secondary | ICD-10-CM | POA: Diagnosis not present

## 2014-09-17 DIAGNOSIS — L84 Corns and callosities: Secondary | ICD-10-CM | POA: Diagnosis not present

## 2014-09-17 DIAGNOSIS — L97521 Non-pressure chronic ulcer of other part of left foot limited to breakdown of skin: Secondary | ICD-10-CM | POA: Diagnosis not present

## 2014-09-24 DIAGNOSIS — S51812A Laceration without foreign body of left forearm, initial encounter: Secondary | ICD-10-CM | POA: Diagnosis not present

## 2014-09-24 DIAGNOSIS — E11621 Type 2 diabetes mellitus with foot ulcer: Secondary | ICD-10-CM | POA: Diagnosis not present

## 2014-09-24 DIAGNOSIS — L84 Corns and callosities: Secondary | ICD-10-CM | POA: Diagnosis not present

## 2014-09-24 DIAGNOSIS — L97521 Non-pressure chronic ulcer of other part of left foot limited to breakdown of skin: Secondary | ICD-10-CM | POA: Diagnosis not present

## 2014-10-08 ENCOUNTER — Encounter (HOSPITAL_BASED_OUTPATIENT_CLINIC_OR_DEPARTMENT_OTHER): Payer: Medicare HMO | Attending: Plastic Surgery

## 2014-10-08 DIAGNOSIS — E1140 Type 2 diabetes mellitus with diabetic neuropathy, unspecified: Secondary | ICD-10-CM | POA: Diagnosis not present

## 2014-10-08 DIAGNOSIS — G4730 Sleep apnea, unspecified: Secondary | ICD-10-CM | POA: Insufficient documentation

## 2014-10-08 DIAGNOSIS — J45909 Unspecified asthma, uncomplicated: Secondary | ICD-10-CM | POA: Insufficient documentation

## 2014-10-08 DIAGNOSIS — E11621 Type 2 diabetes mellitus with foot ulcer: Secondary | ICD-10-CM | POA: Insufficient documentation

## 2014-10-08 DIAGNOSIS — M1990 Unspecified osteoarthritis, unspecified site: Secondary | ICD-10-CM | POA: Insufficient documentation

## 2014-10-08 DIAGNOSIS — L97522 Non-pressure chronic ulcer of other part of left foot with fat layer exposed: Secondary | ICD-10-CM | POA: Insufficient documentation

## 2014-10-15 DIAGNOSIS — L97522 Non-pressure chronic ulcer of other part of left foot with fat layer exposed: Secondary | ICD-10-CM | POA: Diagnosis not present

## 2014-10-15 DIAGNOSIS — J45909 Unspecified asthma, uncomplicated: Secondary | ICD-10-CM | POA: Diagnosis not present

## 2014-10-15 DIAGNOSIS — E11621 Type 2 diabetes mellitus with foot ulcer: Secondary | ICD-10-CM | POA: Diagnosis not present

## 2014-10-15 DIAGNOSIS — G4730 Sleep apnea, unspecified: Secondary | ICD-10-CM | POA: Diagnosis not present

## 2014-10-22 DIAGNOSIS — G4730 Sleep apnea, unspecified: Secondary | ICD-10-CM | POA: Diagnosis not present

## 2014-10-22 DIAGNOSIS — J45909 Unspecified asthma, uncomplicated: Secondary | ICD-10-CM | POA: Diagnosis not present

## 2014-10-22 DIAGNOSIS — E11621 Type 2 diabetes mellitus with foot ulcer: Secondary | ICD-10-CM | POA: Diagnosis not present

## 2014-10-22 DIAGNOSIS — L97522 Non-pressure chronic ulcer of other part of left foot with fat layer exposed: Secondary | ICD-10-CM | POA: Diagnosis not present

## 2014-10-29 ENCOUNTER — Encounter (HOSPITAL_BASED_OUTPATIENT_CLINIC_OR_DEPARTMENT_OTHER): Payer: Medicare HMO | Attending: Plastic Surgery

## 2014-10-29 DIAGNOSIS — M1990 Unspecified osteoarthritis, unspecified site: Secondary | ICD-10-CM | POA: Diagnosis not present

## 2014-10-29 DIAGNOSIS — L97522 Non-pressure chronic ulcer of other part of left foot with fat layer exposed: Secondary | ICD-10-CM | POA: Insufficient documentation

## 2014-10-29 DIAGNOSIS — I10 Essential (primary) hypertension: Secondary | ICD-10-CM | POA: Insufficient documentation

## 2014-10-29 DIAGNOSIS — L03116 Cellulitis of left lower limb: Secondary | ICD-10-CM | POA: Insufficient documentation

## 2014-10-29 DIAGNOSIS — Z89422 Acquired absence of other left toe(s): Secondary | ICD-10-CM | POA: Diagnosis not present

## 2014-10-29 DIAGNOSIS — J45909 Unspecified asthma, uncomplicated: Secondary | ICD-10-CM | POA: Insufficient documentation

## 2014-10-29 DIAGNOSIS — E1136 Type 2 diabetes mellitus with diabetic cataract: Secondary | ICD-10-CM | POA: Insufficient documentation

## 2014-10-29 DIAGNOSIS — E11621 Type 2 diabetes mellitus with foot ulcer: Secondary | ICD-10-CM | POA: Diagnosis not present

## 2014-10-29 DIAGNOSIS — G4730 Sleep apnea, unspecified: Secondary | ICD-10-CM | POA: Insufficient documentation

## 2014-10-29 DIAGNOSIS — E1140 Type 2 diabetes mellitus with diabetic neuropathy, unspecified: Secondary | ICD-10-CM | POA: Insufficient documentation

## 2014-11-05 DIAGNOSIS — J45909 Unspecified asthma, uncomplicated: Secondary | ICD-10-CM | POA: Diagnosis not present

## 2014-11-05 DIAGNOSIS — L97522 Non-pressure chronic ulcer of other part of left foot with fat layer exposed: Secondary | ICD-10-CM | POA: Diagnosis not present

## 2014-11-05 DIAGNOSIS — E1140 Type 2 diabetes mellitus with diabetic neuropathy, unspecified: Secondary | ICD-10-CM | POA: Diagnosis not present

## 2014-11-05 DIAGNOSIS — E11621 Type 2 diabetes mellitus with foot ulcer: Secondary | ICD-10-CM | POA: Diagnosis not present

## 2014-11-12 DIAGNOSIS — E11621 Type 2 diabetes mellitus with foot ulcer: Secondary | ICD-10-CM | POA: Diagnosis not present

## 2014-11-12 DIAGNOSIS — L97522 Non-pressure chronic ulcer of other part of left foot with fat layer exposed: Secondary | ICD-10-CM | POA: Diagnosis not present

## 2014-11-12 DIAGNOSIS — J45909 Unspecified asthma, uncomplicated: Secondary | ICD-10-CM | POA: Diagnosis not present

## 2014-11-12 DIAGNOSIS — E1140 Type 2 diabetes mellitus with diabetic neuropathy, unspecified: Secondary | ICD-10-CM | POA: Diagnosis not present

## 2014-11-19 ENCOUNTER — Emergency Department: Payer: Medicare HMO

## 2014-11-19 ENCOUNTER — Inpatient Hospital Stay
Admission: EM | Admit: 2014-11-19 | Discharge: 2014-11-21 | DRG: 872 | Payer: Medicare HMO | Attending: Internal Medicine | Admitting: Internal Medicine

## 2014-11-19 DIAGNOSIS — Z794 Long term (current) use of insulin: Secondary | ICD-10-CM | POA: Diagnosis not present

## 2014-11-19 DIAGNOSIS — L03032 Cellulitis of left toe: Secondary | ICD-10-CM

## 2014-11-19 DIAGNOSIS — E119 Type 2 diabetes mellitus without complications: Secondary | ICD-10-CM

## 2014-11-19 DIAGNOSIS — T8189XA Other complications of procedures, not elsewhere classified, initial encounter: Secondary | ICD-10-CM | POA: Diagnosis present

## 2014-11-19 DIAGNOSIS — Z7982 Long term (current) use of aspirin: Secondary | ICD-10-CM

## 2014-11-19 DIAGNOSIS — E1140 Type 2 diabetes mellitus with diabetic neuropathy, unspecified: Secondary | ICD-10-CM | POA: Diagnosis present

## 2014-11-19 DIAGNOSIS — Z9889 Other specified postprocedural states: Secondary | ICD-10-CM

## 2014-11-19 DIAGNOSIS — M1990 Unspecified osteoarthritis, unspecified site: Secondary | ICD-10-CM | POA: Diagnosis present

## 2014-11-19 DIAGNOSIS — Z803 Family history of malignant neoplasm of breast: Secondary | ICD-10-CM | POA: Diagnosis not present

## 2014-11-19 DIAGNOSIS — J449 Chronic obstructive pulmonary disease, unspecified: Secondary | ICD-10-CM | POA: Diagnosis present

## 2014-11-19 DIAGNOSIS — Z8673 Personal history of transient ischemic attack (TIA), and cerebral infarction without residual deficits: Secondary | ICD-10-CM

## 2014-11-19 DIAGNOSIS — E1169 Type 2 diabetes mellitus with other specified complication: Secondary | ICD-10-CM

## 2014-11-19 DIAGNOSIS — L97529 Non-pressure chronic ulcer of other part of left foot with unspecified severity: Secondary | ICD-10-CM | POA: Diagnosis present

## 2014-11-19 DIAGNOSIS — A419 Sepsis, unspecified organism: Secondary | ICD-10-CM | POA: Diagnosis not present

## 2014-11-19 DIAGNOSIS — Z79899 Other long term (current) drug therapy: Secondary | ICD-10-CM | POA: Diagnosis not present

## 2014-11-19 DIAGNOSIS — I10 Essential (primary) hypertension: Secondary | ICD-10-CM | POA: Diagnosis present

## 2014-11-19 DIAGNOSIS — E785 Hyperlipidemia, unspecified: Secondary | ICD-10-CM | POA: Diagnosis present

## 2014-11-19 DIAGNOSIS — Z87891 Personal history of nicotine dependence: Secondary | ICD-10-CM | POA: Diagnosis not present

## 2014-11-19 DIAGNOSIS — J45909 Unspecified asthma, uncomplicated: Secondary | ICD-10-CM | POA: Diagnosis present

## 2014-11-19 DIAGNOSIS — Z823 Family history of stroke: Secondary | ICD-10-CM | POA: Diagnosis not present

## 2014-11-19 DIAGNOSIS — L03116 Cellulitis of left lower limb: Secondary | ICD-10-CM | POA: Diagnosis present

## 2014-11-19 DIAGNOSIS — L97509 Non-pressure chronic ulcer of other part of unspecified foot with unspecified severity: Secondary | ICD-10-CM | POA: Diagnosis present

## 2014-11-19 DIAGNOSIS — Z89422 Acquired absence of other left toe(s): Secondary | ICD-10-CM

## 2014-11-19 DIAGNOSIS — Z9849 Cataract extraction status, unspecified eye: Secondary | ICD-10-CM | POA: Diagnosis not present

## 2014-11-19 HISTORY — DX: Anxiety disorder, unspecified: F41.9

## 2014-11-19 HISTORY — DX: Essential (primary) hypertension: I10

## 2014-11-19 HISTORY — DX: Hyperlipidemia, unspecified: E78.5

## 2014-11-19 LAB — BLOOD GAS, ARTERIAL
ALLENS TEST (PASS/FAIL): POSITIVE — AB
Acid-Base Excess: 4.5 mmol/L — ABNORMAL HIGH (ref 0.0–3.0)
BICARBONATE: 30.4 meq/L — AB (ref 21.0–28.0)
FIO2: 0.24
O2 Saturation: 92.4 %
PATIENT TEMPERATURE: 37
PH ART: 7.4 (ref 7.350–7.450)
pCO2 arterial: 49 mmHg — ABNORMAL HIGH (ref 32.0–48.0)
pO2, Arterial: 65 mmHg — ABNORMAL LOW (ref 83.0–108.0)

## 2014-11-19 LAB — COMPREHENSIVE METABOLIC PANEL
ALBUMIN: 3.4 g/dL — AB (ref 3.5–5.0)
ALT: 22 U/L (ref 14–54)
ANION GAP: 9 (ref 5–15)
AST: 31 U/L (ref 15–41)
Alkaline Phosphatase: 56 U/L (ref 38–126)
BUN: 17 mg/dL (ref 6–20)
CO2: 27 mmol/L (ref 22–32)
Calcium: 8.8 mg/dL — ABNORMAL LOW (ref 8.9–10.3)
Chloride: 96 mmol/L — ABNORMAL LOW (ref 101–111)
Creatinine, Ser: 0.88 mg/dL (ref 0.44–1.00)
GFR calc Af Amer: >60 mL/min (ref >60)
GFR calc non Af Amer: >60 mL/min (ref >60)
GLUCOSE: 230 mg/dL — AB (ref 65–99)
POTASSIUM: 4.3 mmol/L (ref 3.5–5.1)
SODIUM: 132 mmol/L — AB (ref 135–145)
Total Bilirubin: 0.6 mg/dL (ref 0.3–1.2)
Total Protein: 7.5 g/dL (ref 6.5–8.1)

## 2014-11-19 LAB — CBC WITH DIFFERENTIAL/PLATELET
BASOS ABS: 0.1 10*3/uL (ref 0–0.1)
Basophils Relative: 1 %
EOS PCT: 0 %
Eosinophils Absolute: 0 10*3/uL (ref 0–0.7)
HEMATOCRIT: 37.5 % (ref 35.0–47.0)
Hemoglobin: 12.2 g/dL (ref 12.0–16.0)
LYMPHS PCT: 5 %
Lymphs Abs: 0.8 10*3/uL — ABNORMAL LOW (ref 1.0–3.6)
MCH: 30 pg (ref 26.0–34.0)
MCHC: 32.5 g/dL (ref 32.0–36.0)
MCV: 92.4 fL (ref 80.0–100.0)
Monocytes Absolute: 1.1 10*3/uL — ABNORMAL HIGH (ref 0.2–0.9)
Monocytes Relative: 6 %
NEUTROS ABS: 16.5 10*3/uL — AB (ref 1.4–6.5)
Neutrophils Relative %: 88 %
PLATELETS: 236 10*3/uL (ref 150–440)
RBC: 4.06 MIL/uL (ref 3.80–5.20)
RDW: 16.2 % — ABNORMAL HIGH (ref 11.5–14.5)
WBC: 18.5 10*3/uL — ABNORMAL HIGH (ref 3.6–11.0)

## 2014-11-19 LAB — URINALYSIS COMPLETE WITH MICROSCOPIC (ARMC ONLY)
BILIRUBIN URINE: NEGATIVE
Bacteria, UA: NONE SEEN
Glucose, UA: 150 mg/dL — AB
Hgb urine dipstick: NEGATIVE
Leukocytes, UA: NEGATIVE
NITRITE: NEGATIVE
Protein, ur: 100 mg/dL — AB
SPECIFIC GRAVITY, URINE: 1.026 (ref 1.005–1.030)
WBC UA: NONE SEEN WBC/hpf (ref 0–5)
pH: 5 (ref 5.0–8.0)

## 2014-11-19 LAB — GLUCOSE, CAPILLARY: GLUCOSE-CAPILLARY: 248 mg/dL — AB (ref 65–99)

## 2014-11-19 LAB — PROTIME-INR
INR: 1.05
Prothrombin Time: 13.9 seconds (ref 11.4–15.0)

## 2014-11-19 LAB — LACTIC ACID, PLASMA
LACTIC ACID, VENOUS: 1.7 mmol/L (ref 0.5–2.0)
Lactic Acid, Venous: 2.2 mmol/L (ref 0.5–2.0)

## 2014-11-19 LAB — TROPONIN I: Troponin I: <0.03 ng/mL (ref <0.031)

## 2014-11-19 MED ORDER — AMITRIPTYLINE HCL 50 MG PO TABS
50.0000 mg | ORAL_TABLET | Freq: Every day | ORAL | Status: DC
  Administered 2014-11-20 – 2014-11-21 (×2): 50 mg via ORAL
  Filled 2014-11-19 (×2): qty 1

## 2014-11-19 MED ORDER — SODIUM CHLORIDE 0.9 % IJ SOLN
3.0000 mL | Freq: Two times a day (BID) | INTRAMUSCULAR | Status: DC
  Administered 2014-11-20 – 2014-11-21 (×4): 3 mL via INTRAVENOUS

## 2014-11-19 MED ORDER — ONDANSETRON HCL 4 MG PO TABS: 4.0000 mg | ORAL_TABLET | Freq: Four times a day (QID) | ORAL | Status: DC | PRN

## 2014-11-19 MED ORDER — VANCOMYCIN HCL IN DEXTROSE 1-5 GM/200ML-% IV SOLN
1000.0000 mg | Freq: Once | INTRAVENOUS | Status: AC
  Administered 2014-11-19: 1000 mg via INTRAVENOUS
  Filled 2014-11-19: qty 200

## 2014-11-19 MED ORDER — ONDANSETRON HCL 4 MG/2ML IJ SOLN: 4.0000 mg | Freq: Four times a day (QID) | INTRAMUSCULAR | Status: DC | PRN

## 2014-11-19 MED ORDER — ACETAMINOPHEN 325 MG PO TABS: 650.0000 mg | ORAL_TABLET | Freq: Four times a day (QID) | ORAL | Status: DC | PRN

## 2014-11-19 MED ORDER — ASPIRIN EC 81 MG PO TBEC
81.0000 mg | DELAYED_RELEASE_TABLET | Freq: Every day | ORAL | Status: DC
  Administered 2014-11-20 – 2014-11-21 (×2): 81 mg via ORAL
  Filled 2014-11-19 (×2): qty 1

## 2014-11-19 MED ORDER — CLOPIDOGREL BISULFATE 75 MG PO TABS
75.0000 mg | ORAL_TABLET | Freq: Every day | ORAL | Status: DC
  Administered 2014-11-20 – 2014-11-21 (×2): 75 mg via ORAL
  Filled 2014-11-19 (×2): qty 1

## 2014-11-19 MED ORDER — NIACIN 500 MG PO TABS
500.0000 mg | ORAL_TABLET | Freq: Every day | ORAL | Status: DC
  Administered 2014-11-20 – 2014-11-21 (×2): 500 mg via ORAL
  Filled 2014-11-19 (×2): qty 1

## 2014-11-19 MED ORDER — ENOXAPARIN SODIUM 40 MG/0.4ML ~~LOC~~ SOLN
40.0000 mg | Freq: Two times a day (BID) | SUBCUTANEOUS | Status: DC
  Administered 2014-11-20 – 2014-11-21 (×4): 40 mg via SUBCUTANEOUS
  Filled 2014-11-19 (×4): qty 0.4

## 2014-11-19 MED ORDER — INSULIN ASPART 100 UNIT/ML ~~LOC~~ SOLN
0.0000 [IU] | Freq: Three times a day (TID) | SUBCUTANEOUS | Status: DC
  Administered 2014-11-20: 11:00:00 3 [IU] via SUBCUTANEOUS
  Administered 2014-11-20: 17:00:00 7 [IU] via SUBCUTANEOUS
  Administered 2014-11-20: 18:00:00 5 [IU] via SUBCUTANEOUS
  Administered 2014-11-20: 2 [IU] via SUBCUTANEOUS
  Administered 2014-11-21 (×2): 3 [IU] via SUBCUTANEOUS
  Administered 2014-11-21: 5 [IU] via SUBCUTANEOUS
  Filled 2014-11-19: qty 3
  Filled 2014-11-19: qty 5
  Filled 2014-11-19: qty 3
  Filled 2014-11-19: qty 5
  Filled 2014-11-19: qty 7
  Filled 2014-11-19: qty 3
  Filled 2014-11-19: qty 2

## 2014-11-19 MED ORDER — SODIUM CHLORIDE 0.9 % IV SOLN
INTRAVENOUS | Status: AC
  Administered 2014-11-19: via INTRAVENOUS

## 2014-11-19 MED ORDER — PIPERACILLIN-TAZOBACTAM 3.375 G IVPB
3.3750 g | Freq: Once | INTRAVENOUS | Status: AC
  Administered 2014-11-19: 3.375 g via INTRAVENOUS
  Filled 2014-11-19: qty 50

## 2014-11-19 MED ORDER — METFORMIN HCL 500 MG PO TABS
500.0000 mg | ORAL_TABLET | Freq: Two times a day (BID) | ORAL | Status: DC
  Administered 2014-11-20 – 2014-11-21 (×4): 500 mg via ORAL
  Filled 2014-11-19 (×4): qty 1

## 2014-11-19 MED ORDER — DEXTROSE 5 % IV SOLN
500.0000 mg | Freq: Once | INTRAVENOUS | Status: AC
  Administered 2014-11-19: 500 mg via INTRAVENOUS
  Filled 2014-11-19: qty 500

## 2014-11-19 MED ORDER — ACETAMINOPHEN 650 MG RE SUPP: 650.0000 mg | Freq: Four times a day (QID) | RECTAL | Status: DC | PRN

## 2014-11-19 NOTE — ED Provider Notes (Signed)
[facility] Emergency Department Provider Note  ____________________________________________  Time seen: Approximately 5:53 PM  I have reviewed the triage vital signs and the nursing notes.   HISTORY  Chief Complaint Code Sepsis  history of present illness is limited by patient's mental status   HPI Molly [name] is a 69 y.o. female who was fine yesterday but woke up this morning with a fever and became disoriented. 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 Patient has been reportedly gagging all day long. Medics reported O2 sats in the 80s when up to 94 with 4 L of oxygen. Fingerstick was 223 by EMS. Patient husband patient's husband comes in reports she is on metformin and insulin 7030 generic Plavix and a blood pressure pill which they get these from [facility] he did not bring the list with him however. Patient's husband further reports patient has diabetes and asthma. No other history is immediately apparent.   Past Medical History  Diagnosis Date  . Diabetes   . Stroke 2008    left weakness  . Hemorrhoid   . COPD (chronic obstructive pulmonary disease)     Patient Active Problem List   Diagnosis Date Noted  . Cellulitis of left foot 05/21/2014  . Diabetes mellitus 05/21/2014  . Chronic anemia 05/21/2014  . Osteomyelitis of left foot 05/21/2014  . CKD (chronic kidney disease) stage 3, GFR 30-59 ml/min 05/21/2014  . Foot ulcer 02/04/2014    Past Surgical History  Procedure Laterality Date  . Colonoscopy  2004  . Cataract extraction    . Amputation Left 05/22/2014    Procedure: AMPUTATION RAY LEFT FOURTH AND FIFTH ;  Surgeon: Toni Arthurs, MD;  Location: [facility] OR;  Service: Orthopedics;  Laterality: Left;    Current Outpatient Rx  Name  Route  Sig  Dispense  Refill  . amitriptyline (ELAVIL) 50 MG tablet   Oral   Take 50 mg by mouth daily.         . Ascorbic Acid (VITAMIN C) 1000 MG tablet    Oral   Take 1,000 mg by mouth daily.         Marland Kitchen aspirin EC 81 MG tablet   Oral   Take 81 mg by mouth daily.         . B Complex-C (SUPER B COMPLEX PO)   Oral   Take 1 tablet by mouth daily.          . calcium carbonate (OS-CAL) 600 MG TABS tablet   Oral   Take 600 mg by mouth daily.         . clopidogrel (PLAVIX) 75 MG tablet   Oral   Take 75 mg by mouth daily.         Marland Kitchen docusate sodium (COLACE) 100 MG capsule   Oral   Take 100 mg by mouth daily.         Marland Kitchen GLUCOSAMINE-CHONDROITIN PO   Oral   Take 1 tablet by mouth 2 (two) times daily.          . insulin aspart protamine- aspart (NOVOLOG MIX 70/30) (70-30) 100 UNIT/ML injection   Subcutaneous   Inject 108 Units into the skin 2 (two) times daily.         Marland Kitchen losartan (COZAAR) 50 MG tablet   Oral   Take 50 mg by mouth daily.         . magnesium oxide (MAG-OX) 400 MG tablet  Oral   Take 400 mg by mouth 2 (two) times daily.         . metFORMIN (GLUCOPHAGE) 500 MG tablet   Oral   Take 500 mg by mouth 2 (two) times daily.          . Multiple Vitamins-Minerals (OCUVITE PO)   Oral   Take 1 capsule by mouth daily.          . naproxen sodium (ANAPROX) 220 MG tablet   Oral   Take 220-440 mg by mouth 2 (two) times daily as needed (for pain).          . Omega-3 Fatty Acids (FISH OIL) 1200 MG CAPS   Oral   Take 1,200 mg by mouth daily.         Marland Kitchen VALERIAN PO   Oral   Take 1 tablet by mouth 2 (two) times daily.          . vitamin E 400 UNIT capsule   Oral   Take 400 Units by mouth daily.         Marland Kitchen acetaminophen (TYLENOL) 325 MG tablet   Oral   Take 2 tablets (650 mg total) by mouth every 6 (six) hours as needed for mild pain (or Fever >/= 101). Patient not taking: Reported on 07/25/2014   30 tablet   0   . alum & mag hydroxide-simeth (MAALOX/MYLANTA) 200-200-20 MG/5ML suspension   Oral   Take 30 mLs by mouth every 6 (six) hours as needed for indigestion or heartburn  (dyspepsia). Patient not taking: Reported on 07/25/2014   355 mL   0   . Bilberry, Vaccinium myrtillus, (BILBERRY PO)   Oral   Take 200 mg by mouth daily.         . Cholecalciferol (VITAMIN D3) 1000 UNITS CAPS   Oral   Take 1,000 Units by mouth daily.         . Coenzyme Q10 (COQ10 PO)   Oral   Take by mouth daily.         . feeding supplement, GLUCERNA SHAKE, (GLUCERNA SHAKE) LIQD   Oral   Take 237 mLs by mouth 2 (two) times daily between meals. Patient not taking: Reported on 07/25/2014   237 mL   0   . insulin aspart (NOVOLOG) 100 UNIT/ML injection   Subcutaneous   Inject 0-20 Units into the skin 3 (three) times daily with meals.   10 mL   11   . insulin aspart protamine- aspart (NOVOLOG MIX 70/30) (70-30) 100 UNIT/ML injection   Subcutaneous   Inject 1 mL (100 Units total) into the skin 2 (two) times daily with a meal.   10 mL   11   . niacin 500 MG tablet   Oral   Take 500 mg by mouth at bedtime.         Marland Kitchen oxyCODONE (OXY IR/ROXICODONE) 5 MG immediate release tablet   Oral   Take 1 tablet (5 mg total) by mouth every 4 (four) hours as needed for moderate pain. Patient not taking: Reported on 07/25/2014   30 tablet   0   . polyethylene glycol (MIRALAX / GLYCOLAX) packet   Oral   Take 17 g by mouth daily.           Allergies Review of patient's allergies indicates no known allergies.  Family History  Problem Relation Age of Onset  . Hyperlipidemia Mother   . Varicose Veins Mother   . Deep vein thrombosis  Father     Social History Social History  Substance Use Topics  . Smoking status: Never Smoker   . Smokeless tobacco: None  . Alcohol Use: No    Review of Systems Constitutional:Fever/chills Eyes: No visual changes. ENT: No sore throat. Cardiovascular: Denies chest pain. Respiratory: Denies shortness of breath. Gastrointestinal: No abdominal pain.  No nausea, no vomiting.  No diarrhea.  No constipation. Genitourinary: Negative for  dysuria. Musculoskeletal: Negative for back pain. Skin: Negative for rash. Neurological: Negative for headaches, old left-sided weakness from previous stroke 10-point ROS otherwise negative.  ____________________________________________   PHYSICAL EXAM:  VITAL SIGNS: ED Triage Vitals  Enc Vitals Group     BP 11/19/14 1748 177/74 mmHg     Pulse Rate 11/19/14 1748 119     Resp 11/19/14 1748 28     Temp 11/19/14 1748 101.1 F (38.4 C)     Temp Source 11/19/14 1748 Oral     SpO2 11/19/14 1748 92 %     Weight --      Height --      Head Cir --      Peak Flow --      Pain Score 11/19/14 1750 4     Pain Loc --      Pain Edu? --      Excl. in GC? --   Constitutional: Alert and oriented. Well appearing and in no acute distress. Eyes: Conjunctivae are normal. PERRL. EOMI. Head: Atraumatic. Nose: No congestion/rhinnorhea. Mouth/Throat: Mucous membranes are moist.  Oropharynx non-erythematous. Neck: No stridor.   Cardiovascular: Tachycardia, regular rhythm. Grossly normal heart sounds.  Good peripheral circulation. Respiratory: Normal respiratory effort.  No retractions. Lungs CTAB. Poor exam due to patient's weakness and no inability to sit up Gastrointestinal: Soft and nontender. No distention. No abdominal bruits. No CVA tenderness. Musculoskeletal: Left lower leg has some redness in the mid shin area. Also some drainage from the area with a left to lateral toes were amputated in that area is also red. Neurologic:  Normal speech and language. No gross focal neurologic deficits are appreciated. No gait instability. Skin: See above note for musculoskeletal. Psychiatric: Mood and affect are normal. Speech and behavior are normal at this time. When patient arrived she was very groggy and woke up subsequently  ____________________________________________   LABS (all labs ordered are listed, but only abnormal results are displayed)  Labs Reviewed  COMPREHENSIVE METABOLIC PANEL -  Abnormal; Notable for the following:    Sodium 132 (*)    Chloride 96 (*)    Glucose, Bld 230 (*)    Calcium 8.8 (*)    Albumin 3.4 (*)    All other components within normal limits  LACTIC ACID, PLASMA - Abnormal; Notable for the following:    Lactic Acid, Venous 2.2 (*)    All other components within normal limits  CBC WITH DIFFERENTIAL/PLATELET - Abnormal; Notable for the following:    WBC 18.5 (*)    RDW 16.2 (*)    Neutro Abs 16.5 (*)    Lymphs Abs 0.8 (*)    Monocytes Absolute 1.1 (*)    All other components within normal limits  BLOOD GAS, ARTERIAL - Abnormal; Notable for the following:    pCO2 arterial 49 (*)    pO2, Arterial 65 (*)    Bicarbonate 30.4 (*)    Acid-Base Excess 4.5 (*)    Allens test (pass/fail) POSITIVE (*)    All other components within normal limits  URINE CULTURE  CULTURE,  BLOOD (ROUTINE X 2)  CULTURE, BLOOD (ROUTINE X 2)  TROPONIN I  PROTIME-INR  LACTIC ACID, PLASMA  URINALYSIS COMPLETEWITH MICROSCOPIC ([facility] ONLY)   ____________________________________________  EKG  EKG read and interpreted by me shows sinus tachycardia at a rate of 116 normal axis no acute changes.[facility]AMP Monitor this time shows a heart rate of 114 patient's awake alert oriented 3 making perfectly good sats looks better than on arrival  ____________________________________________  RADIOLOGY  X-rays read by the radiologist looked at by me as well chest x-ray looks okay although there is elevated right hemidiaphragm and possibly some increased markings there although is difficult to tell because there is not a very deep breath on that side only 8 ribs are showing there are 10 rib show any other side however  X-ray of the foot shows no cortical lesions as radiologist noted some lucencies in the soft tissue which may represent fat or possibly gas although I do not feel any crepitus  ____________________________________________   PROCEDURES Because the patient's  fever tachycardia and altered mental status could sepsis was initiated on patient arrival   ____________________________________________   INITIAL IMPRESSION / ASSESSMENT AND PLAN / ED COURSE  Pertinent labs & imaging results that were available during my care of the patient were reviewed by me and considered in my medical decision making (see chart for details).   ____________________________________________   FINAL CLINICAL IMPRESSION(S) / ED DIAGNOSES  Final diagnoses:  Sepsis affecting skin  Cellulitis of toe of left foot  Cellulitis of left lower extremity      Arnaldo Natal, MD 11/19/14 2013

## 2014-11-19 NOTE — ED Notes (Addendum)
Pt brought to ED via EMS w/ c/o fever and alt mental status.  Per EMS, pts husband called EMS when pts temp reached 102.3 F.  Per EMS, they gave  of Zofran and  800 ibuprofen.  Pt A/Ox4 and diaphoretic.  Pt had L toe amputation in Feb. Pt unable to move L arm and has difficulty moving legs.

## 2014-11-19 NOTE — H&P (Signed)
[facility] [place] [facility]   PATIENT NAME: Molly [name]    MR#:  [id number]  DATE OF BIRTH:  07-14-1945  DATE OF ADMISSION:  11/19/2014  PRIMARY CARE PHYSICIAN: Lennart Pall, MD   REQUESTING/REFERRING PHYSICIAN: Darnelle Catalan, MD  CHIEF COMPLAINT:   Chief Complaint  Patient presents with  . Code Sepsis    HISTORY OF PRESENT ILLNESS:  Molly [name]  is a 69 y.o. female who presents with sepsis secondary to left foot wound infection and cellulitis. Patient states that she had to 2 distal left toes amputated back in February of this year. Since that time she's been having difficulty with wound healing. Per chart review she has had some osteoarthritis at some point as well. She is diabetic on insulin at home. 2 days ago she began having increased malaise as well as some fevers at home. She then began to experience increased weakness and confusion. She came in the ED to be evaluated, where she was found to be tachycardic, hypotensive, with increased white count. She had an area of what might be cellulitis on the left leg, and had some drainage from her left foot wound which is still partially open. X-ray left foot did not show signs of bony destruction. She follows with the wound clinic outpatient, and had her initial surgery done at [facility], and hospital. Hospitalists were called for admission for sepsis.  PAST MEDICAL HISTORY:   Past Medical History  Diagnosis Date  . Diabetes   . Stroke 2008    left weakness  . Hemorrhoid   . COPD (chronic obstructive pulmonary disease)   . HTN (hypertension)   . HLD (hyperlipidemia)   . Anxiety     PAST SURGICAL HISTORY:   Past Surgical History  Procedure Laterality Date  . Colonoscopy  2004  . Cataract extraction    . Amputation Left 05/22/2014    Procedure: AMPUTATION RAY LEFT FOURTH AND FIFTH ;  Surgeon: Toni Arthurs, MD;  Location: [facility] OR;  Service: Orthopedics;  Laterality: Left;    SOCIAL HISTORY:    Social History  Substance Use Topics  . Smoking status: Former Smoker -- 0.50 packs/day for 2 years    Types: Cigarettes    Quit date: 11/18/1968  . Smokeless tobacco: Not on file  . Alcohol Use: No    FAMILY HISTORY:   Family History  Problem Relation Age of Onset  . Hyperlipidemia Mother   . Varicose Veins Mother   . Deep vein thrombosis Father   . Stroke Father   . Breast cancer Paternal Aunt   . Stroke Maternal Grandmother   . Stroke Paternal Grandmother     DRUG ALLERGIES:  No Known Allergies  MEDICATIONS AT HOME:   Prior to Admission medications   Medication Sig Start Date End Date Taking? Authorizing Provider  amitriptyline (ELAVIL) 50 MG tablet Take 50 mg by mouth daily.   Yes Historical Provider, MD  Ascorbic Acid (VITAMIN C) 1000 MG tablet Take 1,000 mg by mouth daily.   Yes Historical Provider, MD  aspirin EC 81 MG tablet Take 81 mg by mouth daily.   Yes Historical Provider, MD  B Complex-C (SUPER B COMPLEX PO) Take 1 tablet by mouth daily.    Yes Historical Provider, MD  Bilberry, Vaccinium myrtillus, (BILBERRY PO) Take 1 capsule by mouth daily.    Yes Historical Provider, MD  calcium carbonate (OS-CAL) 600 MG TABS tablet Take 600 mg by mouth daily.   Yes Historical Provider,  MD  Cholecalciferol (VITAMIN D3) 1000 UNITS CAPS Take 1,000 Units by mouth daily.   Yes Historical Provider, MD  clopidogrel (PLAVIX) 75 MG tablet Take 75 mg by mouth daily.   Yes Historical Provider, MD  Coenzyme Q10 (COQ10 PO) Take 1 capsule by mouth daily.    Yes Historical Provider, MD  docusate sodium (COLACE) 100 MG capsule Take 100 mg by mouth daily.   Yes Historical Provider, MD  GLUCOSAMINE-CHONDROITIN PO Take 1 tablet by mouth 2 (two) times daily.    Yes Historical Provider, MD  insulin aspart protamine- aspart (NOVOLOG MIX 70/30) (70-30) 100 UNIT/ML injection Inject 108 Units into the skin 2 (two) times daily.   Yes Historical Provider, MD  losartan (COZAAR) 50 MG tablet Take  50 mg by mouth daily.   Yes Historical Provider, MD  magnesium oxide (MAG-OX) 400 MG tablet Take 400 mg by mouth 2 (two) times daily.   Yes Historical Provider, MD  metFORMIN (GLUCOPHAGE) 500 MG tablet Take 500 mg by mouth 2 (two) times daily.    Yes Historical Provider, MD  Multiple Vitamins-Minerals (OCUVITE PO) Take 1 capsule by mouth daily.    Yes Historical Provider, MD  naproxen sodium (ANAPROX) 220 MG tablet Take 220-440 mg by mouth 2 (two) times daily as needed (for pain).    Yes Historical Provider, MD  niacin 500 MG tablet Take 500 mg by mouth daily.    Yes Historical Provider, MD  Omega-3 Fatty Acids (FISH OIL) 1200 MG CAPS Take 1,200 mg by mouth daily.   Yes Historical Provider, MD  VALERIAN PO Take 1 tablet by mouth 2 (two) times daily.    Yes Historical Provider, MD  vitamin E 400 UNIT capsule Take 400 Units by mouth daily.   Yes Historical Provider, MD    REVIEW OF SYSTEMS:  Review of Systems  Constitutional: Positive for fever and malaise/fatigue. Negative for chills and weight loss.  HENT: Negative for ear pain, hearing loss and tinnitus.   Eyes: Negative for blurred vision, double vision, pain and redness.  Respiratory: Negative for cough, hemoptysis and shortness of breath.   Cardiovascular: Negative for chest pain, palpitations, orthopnea and leg swelling.  Gastrointestinal: Positive for nausea and vomiting. Negative for abdominal pain, diarrhea and constipation.  Genitourinary: Negative for dysuria, frequency and hematuria.  Musculoskeletal: Negative for back pain, joint pain and neck pain.  Skin:       Left open foot wound. Mild erythema left lower extremity. No acne, rash.  Neurological: Positive for weakness. Negative for dizziness, tremors and focal weakness.  Endo/Heme/Allergies: Negative for polydipsia. Does not bruise/bleed easily.  Psychiatric/Behavioral: Negative for depression. The patient is not nervous/anxious and does not have insomnia.      VITAL SIGNS:    Filed Vitals:   11/19/14 1739 11/19/14 1748 11/19/14 1920  BP:  177/74   Pulse:  119   Temp:  101.1 F (38.4 C)   TempSrc:  Oral   Resp:  28   Height:   5\' 6"  (1.676 m)  Weight:   120.203 kg (265 lb)  SpO2: 89% 92%    Wt Readings from Last 3 Encounters:  11/19/14 120.203 kg (265 lb)  07/25/14 115.667 kg (255 lb)  05/21/14 117.935 kg (260 lb)    PHYSICAL EXAMINATION:  Physical Exam  Vitals reviewed. Constitutional: She is oriented to person, place, and time. She appears well-developed and well-nourished. No distress.  HENT:  Head: Normocephalic and atraumatic.  Mouth/Throat: Oropharynx is clear and moist.  Eyes:  Conjunctivae and EOM are normal. Pupils are equal, round, and reactive to light. No scleral icterus.  Neck: Normal range of motion. Neck supple. No JVD present. No thyromegaly present.  Cardiovascular: Regular rhythm and intact distal pulses.  Exam reveals no gallop and no friction rub.   No murmur heard. Tachycardic  Respiratory: Effort normal and breath sounds normal. No respiratory distress. She has no wheezes. She has no rales.  GI: Soft. Bowel sounds are normal. She exhibits no distension. There is no tenderness.  Musculoskeletal: Normal range of motion. She exhibits no edema.  Lateral 2 toes amputated on the left foot, with persistent open wound with mild drainage and surrounding cellulitis. No arthritis, no gout  Lymphadenopathy:    She has no cervical adenopathy.  Neurological: She is alert and oriented to person, place, and time. No cranial nerve deficit.  No dysarthria, no aphasia  Skin: Skin is warm and dry. No rash noted. There is erythema (mild, left lower show extremity anterior shin).  Psychiatric: She has a normal mood and affect. Her behavior is normal. Judgment and thought content normal.    LABORATORY PANEL:   CBC  Recent Labs Lab 11/19/14 1909  WBC 18.5*  HGB 12.2  HCT 37.5  PLT 236    ------------------------------------------------------------------------------------------------------------------  Chemistries   Recent Labs Lab 11/19/14 1909  NA 132*  K 4.3  CL 96*  CO2 27  GLUCOSE 230*  BUN 17  CREATININE 0.88  CALCIUM 8.8*  AST 31  ALT 22  ALKPHOS 56  BILITOT 0.6   ------------------------------------------------------------------------------------------------------------------  Cardiac Enzymes  Recent Labs Lab 11/19/14 1909  TROPONINI <0.03   ------------------------------------------------------------------------------------------------------------------  RADIOLOGY:  Dg Chest Portable 1 View  11/19/2014   CLINICAL DATA:  Fever and altered mental status. Wound and ulceration to the left lateral foot.  EXAM: PORTABLE CHEST - 1 VIEW  COMPARISON:  None.  FINDINGS: Shallow inspiration. Mild cardiac enlargement with normal pulmonary vascularity. No focal airspace disease or consolidation in the lungs. No blunting of costophrenic angles. No pneumothorax. Mediastinal contours appear intact. Degenerative changes in the spine and shoulders.  IMPRESSION: No active disease.   Electronically Signed   By: Burman Nieves M.D.   On: 11/19/2014 18:28   Dg Foot Complete Left  11/19/2014   CLINICAL DATA:  Fever and altered mental status. Wound on the left lateral foot.  EXAM: LEFT FOOT - COMPLETE 3+ VIEW  COMPARISON:  05/21/2014  FINDINGS: Previous amputation along the proximal aspect of the fourth and fifth metatarsal bones. No evidence for cortical destruction at the amputation sites. Severe spurring at the calcaneus. Degenerative changes along the dorsal aspect of the midfoot. No acute fracture.  IMPRESSION: Post amputation changes as described.  No acute bone abnormality.   Electronically Signed   By: Richarda Overlie M.D.   On: 11/19/2014 18:32    EKG:   Orders placed or performed during the hospital encounter of 11/19/14  . ED EKG  . ED EKG    IMPRESSION AND  PLAN:  Principal Problem:   Sepsis - mildly elevated lactic acid at 2.2, elevated white count, tachycardic on arrival. Patient got aggressive fluid hydration the ED, we'll continue this on admission for blood pressure support, trend lactic acid, broad-spectrum antibiotics started in the ED, continue these on admission as well, blood and urine cultures sent from the ED, chest x-ray did not show signs of pneumonia, treatment of other problems as below. Active Problems:   Foot ulcer - wound care consult ordered, podiatry  consult ordered, to further recommendations to whether or not they want a wound culture or any further imaging for osteomyelitis. Foot x-ray did not show bony destruction. Antibiotics as above.   Cellulitis of left lower extremity - antibiotics as above.   Diabetes mellitus - carb modified diet with sliding scale insulin and appropriate fingerstick glucose checks   COPD (chronic obstructive pulmonary disease) - continue home meds   HTN (hypertension) - hold home antihypertensives for now until her blood pressure improves, aggressive fluids for blood pressure support as above   HLD (hyperlipidemia) - continue home meds  All the records are reviewed and case discussed with ED provider. Management plans discussed with the patient and/or family.  DVT PROPHYLAXIS: SubQ lovenox  ADMISSION STATUS: Inpatient  CODE STATUS: full  TOTAL TIME TAKING CARE OF THIS PATIENT: 45 minutes.    Astoria Condon FIELDING 11/19/2014, 9:24 PM  [facility]  Office  [phone]  CC: Primary care physician; Lennart Pall, MD

## 2014-11-20 LAB — BASIC METABOLIC PANEL
ANION GAP: 9 (ref 5–15)
BUN: 18 mg/dL (ref 6–20)
CALCIUM: 8.8 mg/dL — AB (ref 8.9–10.3)
CO2: 30 mmol/L (ref 22–32)
Chloride: 94 mmol/L — ABNORMAL LOW (ref 101–111)
Creatinine, Ser: 1.09 mg/dL — ABNORMAL HIGH (ref 0.44–1.00)
GFR, EST AFRICAN AMERICAN: 59 mL/min — AB (ref >60)
GFR, EST NON AFRICAN AMERICAN: 51 mL/min — AB (ref >60)
Glucose, Bld: 220 mg/dL — ABNORMAL HIGH (ref 65–99)
POTASSIUM: 4.2 mmol/L (ref 3.5–5.1)
SODIUM: 133 mmol/L — AB (ref 135–145)

## 2014-11-20 LAB — CBC
HEMATOCRIT: 38.2 % (ref 35.0–47.0)
HEMOGLOBIN: 12.2 g/dL (ref 12.0–16.0)
MCH: 29.6 pg (ref 26.0–34.0)
MCHC: 32.1 g/dL (ref 32.0–36.0)
MCV: 92.2 fL (ref 80.0–100.0)
Platelets: 230 10*3/uL (ref 150–440)
RBC: 4.14 MIL/uL (ref 3.80–5.20)
RDW: 15.6 % — ABNORMAL HIGH (ref 11.5–14.5)
WBC: 14.4 10*3/uL — AB (ref 3.6–11.0)

## 2014-11-20 LAB — GLUCOSE, CAPILLARY
GLUCOSE-CAPILLARY: 314 mg/dL — AB (ref 65–99)
GLUCOSE-CAPILLARY: 255 mg/dL — AB (ref 65–99)
Glucose-Capillary: 241 mg/dL — ABNORMAL HIGH (ref 65–99)
Glucose-Capillary: 170 mg/dL — ABNORMAL HIGH (ref 65–99)

## 2014-11-20 LAB — HEMOGLOBIN A1C: HEMOGLOBIN A1C: 7.9 % — AB (ref 4.0–6.0)

## 2014-11-20 MED ORDER — COLLAGENASE 250 UNIT/GM EX OINT
TOPICAL_OINTMENT | Freq: Every day | CUTANEOUS | Status: DC
  Filled 2014-11-20: qty 30

## 2014-11-20 MED ORDER — PIPERACILLIN-TAZOBACTAM 3.375 G IVPB
3.3750 g | Freq: Three times a day (TID) | INTRAVENOUS | Status: DC
  Filled 2014-11-20 (×2): qty 50

## 2014-11-20 MED ORDER — VANCOMYCIN HCL 10 G IV SOLR
1250.0000 mg | Freq: Two times a day (BID) | INTRAVENOUS | Status: DC
  Administered 2014-11-20: 02:00:00 1250 mg via INTRAVENOUS
  Filled 2014-11-20 (×4): qty 1250

## 2014-11-20 MED ORDER — PIPERACILLIN-TAZOBACTAM 4.5 G IVPB
4.5000 g | Freq: Three times a day (TID) | INTRAVENOUS | Status: DC
  Administered 2014-11-20: 06:00:00 4.5 g via INTRAVENOUS
  Filled 2014-11-20 (×5): qty 100

## 2014-11-20 MED ORDER — INSULIN ASPART PROT & ASPART (70-30 MIX) 100 UNIT/ML ~~LOC~~ SUSP
50.0000 [IU] | Freq: Two times a day (BID) | SUBCUTANEOUS | Status: DC
  Administered 2014-11-20 – 2014-11-21 (×2): 50 [IU] via SUBCUTANEOUS
  Filled 2014-11-20 (×2): qty 50

## 2014-11-20 MED ORDER — AMOXICILLIN-POT CLAVULANATE 875-125 MG PO TABS
1.0000 | ORAL_TABLET | Freq: Two times a day (BID) | ORAL | Status: DC
  Administered 2014-11-20 – 2014-11-21 (×3): 1 via ORAL
  Filled 2014-11-20 (×3): qty 1

## 2014-11-20 MED ORDER — PIPERACILLIN-TAZOBACTAM 4.5 G IVPB
4.5000 g | Freq: Three times a day (TID) | INTRAVENOUS | Status: DC
  Filled 2014-11-20 (×4): qty 100

## 2014-11-20 MED ORDER — VANCOMYCIN HCL IN DEXTROSE 1-5 GM/200ML-% IV SOLN
1000.0000 mg | Freq: Two times a day (BID) | INTRAVENOUS | Status: DC
  Filled 2014-11-20 (×3): qty 200

## 2014-11-20 NOTE — Progress Notes (Signed)
ANTIBIOTIC CONSULT NOTE - INITIAL  Pharmacy Consult for vancomycin and Zosyn dosing Indication: sepsis  No Known Allergies  Patient Measurements: Height:  (167.6 cm) Weight: 265 lb 14.4 oz (120.611 kg) IBW/kg (Calculated) : 59.3 Adjusted Body Weight: 84 kg  Vital Signs: Temp: 97.7 F (36.5 C) (08/22 2242) Temp Source: Oral (08/22 2242) BP: 138/62 mmHg (08/22 2242) Pulse Rate: 100 (08/22 2242) Intake/Output from previous day: 08/22 0701 - 08/23 0700 In: -  Out: 300 [Urine:300] Intake/Output from this shift: Total I/O In: -  Out: 300 [Urine:300]  Labs:  Recent Labs  11/19/14 1909  WBC 18.5*  HGB 12.2  PLT 236  CREATININE 0.88   Estimated Creatinine Clearance: 80.9 mL/min (by C-G formula based on Cr of 0.88). No results for input(s): VANCOTROUGH, VANCOPEAK, VANCORANDOM, GENTTROUGH, GENTPEAK, GENTRANDOM, TOBRATROUGH, TOBRAPEAK, TOBRARND, AMIKACINPEAK, AMIKACINTROU, AMIKACIN in the last 72 hours.   Microbiology: No results found for this or any previous visit (from the past 720 hour(s)).  Medical History: Past Medical History  Diagnosis Date  . Diabetes   . Stroke 2008    left weakness  . Hemorrhoid   . COPD (chronic obstructive pulmonary disease)   . HTN (hypertension)   . HLD (hyperlipidemia)   . Anxiety     Medications:   Assessment: UA: (-) Blood and urine cx pending CXR: no acute disease  Goal of Therapy:  Vancomycin trough level 15-20 mcg/ml  Plan:  TBW 120.6kg  IBW 59.2kg  DW 84 kg  Vd 59L kei 0.072 hr-1  T1/2 10 hours Vancomycin 1 gram given in ED, out of window for stacked dosing. 1250 mg IV q 12 hours ordered. Level before 5th dose  Zosyn 4.5 grams q 8 hours ordered for TBW >120kg.  Fulton Reek, PharmD, BCPS  11/20/2014

## 2014-11-20 NOTE — Consult Note (Signed)
Reason for Consult: Chronic wound on the left foot status post fourth and fifth ray resection back in February.  Referring Physician: [facility]  Molly [name] is an 69 y.o. female.  HPI: The patient relates previous ulceration on her left foot and undergoing surgery back in February to remove the fourth and fifth toes and the corresponding bones behind the toes. She subsequently went to [facility], [place] for rehabilitation which she states was not really helpful. Subsequently ended up at the wound care center that has been managing the care for her foot. States that it has been improving over the past few months. Recently developed some nausea and confusion and presented to the emergency department for evaluation. She was admitted for sepsis with an elevated white count. Upon further questioning she does relate taking some of her husband's oxycodone which she thinks may have given her some of her confusion and symptoms.  Past Medical History  Diagnosis Date  . Diabetes   . Stroke 2008    left weakness  . Hemorrhoid   . COPD (chronic obstructive pulmonary disease)   . HTN (hypertension)   . HLD (hyperlipidemia)   . Anxiety     Past Surgical History  Procedure Laterality Date  . Colonoscopy  2004  . Cataract extraction    . Amputation Left 05/22/2014    Procedure: AMPUTATION RAY LEFT FOURTH AND FIFTH ;  Surgeon: Wylene Simmer, MD;  Location: [facility];  Service: Orthopedics;  Laterality: Left;    Family History  Problem Relation Age of Onset  . Hyperlipidemia Mother   . Varicose Veins Mother   . Deep vein thrombosis Father   . Stroke Father   . Breast cancer Paternal Aunt   . Stroke Maternal Grandmother   . Stroke Paternal Grandmother     Social History:  reports that she quit smoking about 46 years ago. Her smoking use included Cigarettes. She has a 1 pack-year smoking history. She does not have any smokeless tobacco history on file. She reports that she does not drink  alcohol or use illicit drugs.  Allergies: No Known Allergies  Medications: I have reviewed the patient's current medications.  Results for orders placed or performed during the hospital encounter of 11/19/14 (from the past 48 hour(s))  Lactic acid, plasma     Status: Abnormal   Collection Time: 11/19/14 10:09 AM  Result Value Ref Range   Lactic Acid, Venous 2.2 (HH) 0.5 - 2.0 mmol/L    Comment: CRITICAL RESULT CALLED TO, READ BACK BY AND VERIFIED WITH: CALLED TO NELLIE MONAR @ 1932 ON 11/19/2014 BY CAF   Culture, blood (routine x 2)     Status: None (Preliminary result)   Collection Time: 11/19/14  6:09 PM  Result Value Ref Range   Specimen Description BLOOD LEFT ARM    Special Requests BOTTLES DRAWN AEROBIC AND ANAEROBIC 8CCAER 5CC ANA    Culture NO GROWTH < 12 HOURS    Report Status PENDING   Culture, blood (routine x 2)     Status: None (Preliminary result)   Collection Time: 11/19/14  6:11 PM  Result Value Ref Range   Specimen Description BLOOD RIGHT HAND    Special Requests BOTTLES DRAWN AEROBIC AND ANAEROBIC 8CC    Culture NO GROWTH < 12 HOURS    Report Status PENDING   Blood gas, arterial     Status: Abnormal   Collection Time: 11/19/14  6:20 PM  Result Value Ref Range   FIO2  0.24    Delivery systems NO CHARGE    pH, Arterial 7.40 7.350 - 7.450   pCO2 arterial 49 (H) 32.0 - 48.0 mmHg   pO2, Arterial 65 (L) 83.0 - 108.0 mmHg   Bicarbonate 30.4 (H) 21.0 - 28.0 mEq/L   Acid-Base Excess 4.5 (H) 0.0 - 3.0 mmol/L   O2 Saturation 92.4 %   Patient temperature 37.0    Collection site RIGHT RADIAL    Sample type ARTERIAL DRAW    Allens test (pass/fail) POSITIVE (A) PASS  Comprehensive metabolic panel     Status: Abnormal   Collection Time: 11/19/14  7:09 PM  Result Value Ref Range   Sodium 132 (L) 135 - 145 mmol/L   Potassium 4.3 3.5 - 5.1 mmol/L   Chloride 96 (L) 101 - 111 mmol/L   CO2 27 22 - 32 mmol/L   Glucose, Bld 230 (H) 65 - 99 mg/dL   BUN 17 6 - 20 mg/dL    Creatinine, Ser 0.88 0.44 - 1.00 mg/dL   Calcium 8.8 (L) 8.9 - 10.3 mg/dL   Total Protein 7.5 6.5 - 8.1 g/dL   Albumin 3.4 (L) 3.5 - 5.0 g/dL   AST 31 15 - 41 U/L   ALT 22 14 - 54 U/L   Alkaline Phosphatase 56 38 - 126 U/L   Total Bilirubin 0.6 0.3 - 1.2 mg/dL   GFR calc non Af Amer >60 >60 mL/min   GFR calc Af Amer >60 >60 mL/min    Comment: (NOTE) The eGFR has been calculated using the CKD EPI equation. This calculation has not been validated in all clinical situations. eGFR's persistently <60 mL/min signify possible Chronic Kidney Disease.    Anion gap 9 5 - 15  Troponin I     Status: None   Collection Time: 11/19/14  7:09 PM  Result Value Ref Range   Troponin I <0.03 <0.031 ng/mL    Comment:        NO INDICATION OF MYOCARDIAL INJURY.   CBC with Differential     Status: Abnormal   Collection Time: 11/19/14  7:09 PM  Result Value Ref Range   WBC 18.5 (H) 3.6 - 11.0 K/uL   RBC 4.06 3.80 - 5.20 MIL/uL   Hemoglobin 12.2 12.0 - 16.0 g/dL   HCT 37.5 35.0 - 47.0 %   MCV 92.4 80.0 - 100.0 fL   MCH 30.0 26.0 - 34.0 pg   MCHC 32.5 32.0 - 36.0 g/dL   RDW 16.2 (H) 11.5 - 14.5 %   Platelets 236 150 - 440 K/uL   Neutrophils Relative % 88 %   Neutro Abs 16.5 (H) 1.4 - 6.5 K/uL   Lymphocytes Relative 5 %   Lymphs Abs 0.8 (L) 1.0 - 3.6 K/uL   Monocytes Relative 6 %   Monocytes Absolute 1.1 (H) 0.2 - 0.9 K/uL   Eosinophils Relative 0 %   Eosinophils Absolute 0.0 0 - 0.7 K/uL   Basophils Relative 1 %   Basophils Absolute 0.1 0 - 0.1 K/uL  Protime-INR     Status: None   Collection Time: 11/19/14  7:09 PM  Result Value Ref Range   Prothrombin Time 13.9 11.4 - 15.0 seconds   INR 1.05   Urinalysis complete, with microscopic     Status: Abnormal   Collection Time: 11/19/14  7:29 PM  Result Value Ref Range   Color, Urine YELLOW (A) YELLOW   APPearance HAZY (A) CLEAR   Glucose, UA 150 (A) NEGATIVE mg/dL  Bilirubin Urine NEGATIVE NEGATIVE   Ketones, ur TRACE (A) NEGATIVE mg/dL    Specific Gravity, Urine 1.026 1.005 - 1.030   Hgb urine dipstick NEGATIVE NEGATIVE   pH 5.0 5.0 - 8.0   Protein, ur 100 (A) NEGATIVE mg/dL   Nitrite NEGATIVE NEGATIVE   Leukocytes, UA NEGATIVE NEGATIVE   RBC / HPF 6-30 0 - 5 RBC/hpf   WBC, UA NONE SEEN 0 - 5 WBC/hpf   Bacteria, UA NONE SEEN NONE SEEN   Squamous Epithelial / LPF 0-5 (A) NONE SEEN   Mucous PRESENT   Lactic acid, plasma     Status: None   Collection Time: 11/19/14  8:50 PM  Result Value Ref Range   Lactic Acid, Venous 1.7 0.5 - 2.0 mmol/L  Glucose, capillary     Status: Abnormal   Collection Time: 11/19/14 11:15 PM  Result Value Ref Range   Glucose-Capillary 248 (H) 65 - 99 mg/dL   Comment 1 Notify RN   Basic metabolic panel     Status: Abnormal   Collection Time: 11/20/14 12:59 AM  Result Value Ref Range   Sodium 133 (L) 135 - 145 mmol/L   Potassium 4.2 3.5 - 5.1 mmol/L   Chloride 94 (L) 101 - 111 mmol/L   CO2 30 22 - 32 mmol/L   Glucose, Bld 220 (H) 65 - 99 mg/dL   BUN 18 6 - 20 mg/dL   Creatinine, Ser 1.09 (H) 0.44 - 1.00 mg/dL   Calcium 8.8 (L) 8.9 - 10.3 mg/dL   GFR calc non Af Amer 51 (L) >60 mL/min   GFR calc Af Amer 59 (L) >60 mL/min    Comment: (NOTE) The eGFR has been calculated using the CKD EPI equation. This calculation has not been validated in all clinical situations. eGFR's persistently <60 mL/min signify possible Chronic Kidney Disease.    Anion gap 9 5 - 15  CBC     Status: Abnormal   Collection Time: 11/20/14 12:59 AM  Result Value Ref Range   WBC 14.4 (H) 3.6 - 11.0 K/uL   RBC 4.14 3.80 - 5.20 MIL/uL   Hemoglobin 12.2 12.0 - 16.0 g/dL   HCT 38.2 35.0 - 47.0 %   MCV 92.2 80.0 - 100.0 fL   MCH 29.6 26.0 - 34.0 pg   MCHC 32.1 32.0 - 36.0 g/dL   RDW 15.6 (H) 11.5 - 14.5 %   Platelets 230 150 - 440 K/uL  Glucose, capillary     Status: Abnormal   Collection Time: 11/20/14  7:54 AM  Result Value Ref Range   Glucose-Capillary 241 (H) 65 - 99 mg/dL  Glucose, capillary     Status:  Abnormal   Collection Time: 11/20/14 11:09 AM  Result Value Ref Range   Glucose-Capillary 314 (H) 65 - 99 mg/dL    Dg Chest Portable 1 View  11/19/2014   CLINICAL DATA:  Fever and altered mental status. Wound and ulceration to the left lateral foot.  EXAM: PORTABLE CHEST - 1 VIEW  COMPARISON:  None.  FINDINGS: Shallow inspiration. Mild cardiac enlargement with normal pulmonary vascularity. No focal airspace disease or consolidation in the lungs. No blunting of costophrenic angles. No pneumothorax. Mediastinal contours appear intact. Degenerative changes in the spine and shoulders.  IMPRESSION: No active disease.   Electronically Signed   By: Lucienne Capers M.D.   On: 11/19/2014 18:28   Dg Foot Complete Left  11/19/2014   CLINICAL DATA:  Fever and altered mental status. Wound on the  left lateral foot.  EXAM: LEFT FOOT - COMPLETE 3+ VIEW  COMPARISON:  05/21/2014  FINDINGS: Previous amputation along the proximal aspect of the fourth and fifth metatarsal bones. No evidence for cortical destruction at the amputation sites. Severe spurring at the calcaneus. Degenerative changes along the dorsal aspect of the midfoot. No acute fracture.  IMPRESSION: Post amputation changes as described.  No acute bone abnormality.   Electronically Signed   By: Markus Daft M.D.   On: 11/19/2014 18:32    Review of Systems  Constitutional: Positive for fever and malaise/fatigue.  HENT: Negative.   Eyes: Negative.   Respiratory: Negative.   Cardiovascular: Negative.   Gastrointestinal: Positive for nausea.  Genitourinary: Negative.   Musculoskeletal: Negative.   Skin:       Some redness and a chronic draining wound along side of her foot from her previous surgery site.  Neurological:       She does relate numbness and paresthesias from diabetic neuropathy.  Endo/Heme/Allergies: Negative.   Psychiatric/Behavioral: Negative.    Blood pressure 148/66, pulse 94, temperature 98.2 F (36.8 C), temperature source Oral,  resp. rate 20, height '5\' 6"'  (1.676 m), weight 120.611 kg (265 lb 14.4 oz), SpO2 93 %. Physical Exam  Cardiovascular:  DP and PT pulses are fully palpable. Capillary filling time is intact.  Musculoskeletal:  Some medial angulation of the remaining toes on the left foot due to previous removal of the fourth and fifth metatarsals and toes. Stiff range of motion. Muscle testing is deferred.  Neurological:  Loss of protective threshold with monofilament wire. Proprioception is impaired.  Skin:  The skin is warm dry and supple with adequate hair growth. Previous amputation of the left fourth and fifth toes. There is some erythema along the lateral aspect of the foot with a full-thickness wound over the surgical site measuring approximately 2 cm x 1 cm. Minimal purulence with a granular base upon debridement with good active bleeding. No clear evidence of probing to bone. No ascending cellulitis away from the area. 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 Diabetes with associated neuropathy.  Plan: Minimal debridement of the wound with sharp excision using tissue nippers. A saline wet-to-dry dressing applied to the wound. We'll begin daily saline wet-to-dry dressing changes. At this point no clear evidence of any deep abscess is noted. X-rays were negative as far as any chronic bone changes other than some exostosis formation from the previous amputations. MRI could be considered but at this point I do not clearly see no evidence for deeper infection. Would recommend continued outpatient treatment with a round of antibiotics and evaluation.  Molly Helton W. 11/20/2014, 1:10 PM

## 2014-11-20 NOTE — Progress Notes (Signed)
Inpatient Diabetes Program Recommendations  AACE/ADA: New Consensus Statement on Inpatient Glycemic Control (2013)  Target Ranges:  Prepandial:   less than 140 mg/dL      Peak postprandial:   less than 180 mg/dL (1-2 hours)      Critically ill patients:  140 - 180 mg/dL   Reason for review: elevated CBG  Diabetes history: Type 2 Outpatient Diabetes medications: Novolog mix 70/30 108 units bid, Glucophage  bid Current orders for Inpatient glycemic control:Novolog mix 50 units bid, novolog 0-9 units tid with meals and hs, Glucophage  bid  Spoke with patient at the bedside- she does take her medication as ordered(insulin and Glucophage bid) and splits the 108 units up and delivers it into 2 locations to help with absorption. Will follow- awaiting A1C result.  Susette Racer, RN, BA, MHA, CDE Diabetes Coordinator Inpatient Diabetes Program  [phone] (Team Pager) [phone] [facility] Office) 11/20/2014 3:33 PM

## 2014-11-20 NOTE — Progress Notes (Signed)
[facility] [place] at [facility], [place]   PATIENT NAME: Molly [name]    MR#:  [id number]  DATE OF BIRTH:  September 17, 1945  SUBJECTIVE:  Doing well. Per husband much bettr today and her left LE wound looks better also  REVIEW OF SYSTEMS:   Review of Systems  Constitutional: Negative for fever, chills and weight loss.  HENT: Negative for ear discharge, ear pain and nosebleeds.   Eyes: Negative for blurred vision, pain and discharge.  Respiratory: Negative for sputum production, shortness of breath, wheezing and stridor.   Cardiovascular: Negative for chest pain, palpitations, orthopnea and PND.  Gastrointestinal: Negative for nausea, vomiting, abdominal pain and diarrhea.  Genitourinary: Negative for urgency and frequency.  Musculoskeletal: Positive for back pain and joint pain.  Neurological: Positive for weakness. Negative for sensory change, speech change and focal weakness.  Psychiatric/Behavioral: Negative for depression and hallucinations. The patient is not nervous/anxious.   All other systems reviewed and are negative.  Tolerating Diet:yes Tolerating PT: rec rehab  DRUG ALLERGIES:  No Known Allergies  VITALS:  Blood pressure 148/66, pulse 94, temperature 98.2 F (36.8 C), temperature source Oral, resp. rate 20, height 5\' 6"  (1.676 m), weight 120.611 kg (265 lb 14.4 oz), SpO2 93 %.  PHYSICAL EXAMINATION:   Physical Exam  GENERAL:  69 y.o.-year-old patient lying in the bed with no acute distress. obese EYES: Pupils equal, round, reactive to light and accommodation. No scleral icterus. Extraocular muscles intact.  HEENT: Head atraumatic, normocephalic. Oropharynx and nasopharynx clear.  NECK:  Supple, no jugular venous distention. No thyroid enlargement, no tenderness.  LUNGS: Normal breath sounds bilaterally, no wheezing, rales, rhonchi. No use of accessory muscles of respiration.  CARDIOVASCULAR: S1, S2 normal. No murmurs, rubs, or gallops.  ABDOMEN:  Soft, nontender, nondistended. Bowel sounds present. No organomegaly or mass.  EXTREMITIES: No cyanosis, clubbing or edema b/l.    Lateral 2 toes amputated on the left foot, with persistent open wound with mild drainage and surrounding cellulitis. No arthritis, no gout  Lymphadenopathy:  NEUROLOGIC: Cranial nerves II through XII are intact. No focal Motor or sensory deficits b/l.   PSYCHIATRIC: The patient is alert and oriented x 3.  SKIN: No obvious rash, lesion, or ulcer.    LABORATORY PANEL:   CBC  Recent Labs Lab 11/20/14 0059  WBC 14.4*  HGB 12.2  HCT 38.2  PLT 230    Chemistries   Recent Labs Lab 11/19/14 1909 11/20/14 0059  NA 132* 133*  K 4.3 4.2  CL 96* 94*  CO2 27 30  GLUCOSE 230* 220*  BUN 17 18  CREATININE 0.88 1.09*  CALCIUM 8.8* 8.8*  AST 31  --   ALT 22  --   ALKPHOS 56  --   BILITOT 0.6  --     Cardiac Enzymes  Recent Labs Lab 11/19/14 1909  TROPONINI <0.03    RADIOLOGY:  Dg Chest Portable 1 View  11/19/2014   CLINICAL DATA:  Fever and altered mental status. Wound and ulceration to the left lateral foot.  EXAM: PORTABLE CHEST - 1 VIEW  COMPARISON:  None.  FINDINGS: Shallow inspiration. Mild cardiac enlargement with normal pulmonary vascularity. No focal airspace disease or consolidation in the lungs. No blunting of costophrenic angles. No pneumothorax. Mediastinal contours appear intact. Degenerative changes in the spine and shoulders.  IMPRESSION: No active disease.   Electronically Signed   By: Burman Nieves M.D.   On: 11/19/2014 18:28   Dg Foot Complete Left  11/19/2014   CLINICAL DATA:  Fever and altered mental status. Wound on the left lateral foot.  EXAM: LEFT FOOT - COMPLETE 3+ VIEW  COMPARISON:  05/21/2014  FINDINGS: Previous amputation along the proximal aspect of the fourth and fifth metatarsal bones. No evidence for cortical destruction at the amputation sites. Severe spurring at the calcaneus. Degenerative changes along the dorsal  aspect of the midfoot. No acute fracture.  IMPRESSION: Post amputation changes as described.  No acute bone abnormality.   Electronically Signed   By: Richarda Overlie M.D.   On: 11/19/2014 18:32     ASSESSMENT AND PLAN:  69 y.o. female who presents with sepsis secondary to left foot wound infection and cellulitis. Patient states that she had to 2 distal left toes amputated back in February of this year. Since that time she's been having difficulty with wound healing. Per chart review she has had some osteoarthritis at some point as well. She is diabetic on insulin at home  *Sepsis - mildly elevated lactic acid at 2.2, elevated white count, tachycardic on arrival. Patient got aggressive fluid hydration the ED, we'll continue this on admission for blood pressure support, trend lactic acid, -change IV vanc and zosyn to po augmentin -BC neg -seen by podiatry and minimal debridement done at bedside  * left chronic Foot ulcer - wound care consult ordered, podiatry consult as above  * Diabetes mellitus - carb modified diet with sliding scale insulin and appropriate fingerstick glucose checks  * COPD (chronic obstructive pulmonary disease) - continue home meds  * HTN (hypertension) - hold home antihypertensives for now until her blood pressure improves  aggressive fluids for blood pressure support as above  *HLD (hyperlipidemia) - continue home meds  *CSW for STR placement Case discussed with Care Management/Social Worker. Management plans discussed with the patient, family and they are in agreement.  CODE STATUS: full  DVT Prophylaxis: lovenox TOTAL TIME TAKING CARE OF THIS PATIENT: .  >50% time spent on counselling and coordination of care with pt, husband  POSSIBLE D/C IN1-2DAYS, DEPENDING ON CLINICAL CONDITION.   Travone Georg M.D on 11/20/2014 at 3:07 PM  Between 7am to 6pm - Pager - [phone]  After 6pm go to [email or web address] - password [email or web address] [facility]  [facility]  Office  [phone]  CC: Primary care physician; Lennart Pall, MD

## 2014-11-20 NOTE — Evaluation (Signed)
Physical Therapy Evaluation Patient Details Name: Molly [name] MRN: [id number] DOB: 1945/10/27 Today's Date: 11/20/2014   History of Present Illness  presented to ER secondary to L foot wound (4th and 5th toes amputated Feb 2016); admitted with sepsis related to cellulitis of L foot/LE.  Clinical Impression  Upon evaluation, patient alert and oriented to all information.  Follows all commands and demonstrates good safety awareness/insight.  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  Currently requiring mod assist for supine/sit (transition towards R), min/mod assist +2 for sit/stand and very short-distance gait from bed/chair (4 steps).  Maintains L LE anterior/lateral to BOS with very minimal WBing during mobility efforts.  Additional mobility/WBing deferred until cleared by podiatry (in light of non-healing wound). Would benefit from skilled PT to address above deficits and promote optimal return to PLOF; recommend transition to STR upon discharge from acute hospitalization.     Follow Up Recommendations SNF    Equipment Recommendations       Recommendations for Other Services       Precautions / Restrictions Precautions Precautions: Fall Restrictions Weight Bearing Restrictions: No      Mobility  Bed Mobility Overal bed mobility: Needs Assistance Bed Mobility: Supine to Sit     Supine to sit: Mod assist     General bed mobility comments: heavy use of momentum to initiate truncal elevation  Transfers Overall transfer level: Needs assistance   Transfers: Sit to/from Stand Sit to Stand: Min assist;Mod assist;+2 physical assistance         General transfer comment: tends to maintain L LE anterior/lateral to immediate BOS; limited active use during movement transition  Ambulation/Gait Ambulation/Gait assistance: Min assist;Mod assist;+2 physical assistance Ambulation  Distance (Feet): 4 Feet Assistive device: 2 person hand held assist       General Gait Details: step to gait pattern with limited WBing/use of L LE (tends to maintain anterior/lateral to immediate BOS); increased L ankle PF, genu recurvatum in closed-chain position.  Additional gait efforts deferred until cleared by podiatry (in light of non-healing foot wound)  Stairs            Wheelchair Mobility    Modified Rankin (Stroke Patients Only)       Balance Overall balance assessment: Needs assistance Sitting-balance support: No upper extremity supported;Feet supported Sitting balance-Leahy Scale: Fair Sitting balance - Comments: close sup once assisted to midline     Standing balance-Leahy Scale: Poor                               Pertinent Vitals/Pain Pain Assessment: No/denies pain    Home Living Family/patient expects to be discharged to:: Private residence Living Arrangements: Spouse/significant other Available Help at Discharge: Family (husband with back issues; limited assist available) Type of Home: House Home Access: Stairs to enter   [employer] of Steps: 2 Home Layout: Two level;Able to live on main level with bedroom/bathroom (main level with basement) Home Equipment: Cane - single point      Prior Function Level of Independence: Independent with assistive device(s)         Comments: Mod indep with SPC for ADLs, household and limited community mobility     Hand Dominance   Dominant Hand: Right    Extremity/Trunk Assessment   Upper Extremity Assessment:  (R UE grossly WFL for strength/ROM; L UE with residual  hemiparesis (2-/5) and 1-finger subluxation to L shoulder; increased flexor synergy to L UE with exertion.)           Lower Extremity Assessment:  (R LE grossly WFL for strength/ROM; L LE 3-/5 at hip/knee, 2-/5 at ankle (passive DF to neutral).  Decreased sensory awareness L LE distal to knee.)          Communication   Communication: No difficulties  Cognition Arousal/Alertness: Awake/alert Behavior During Therapy: WFL for tasks assessed/performed Overall Cognitive Status: Within Functional Limits for tasks assessed                      General Comments General comments (skin integrity, edema, etc.): L foot wound (bandaged and non-visualized during evaluation)    Exercises        Assessment/Plan    PT Assessment Patient needs continued PT services  PT Diagnosis Difficulty walking;Generalized weakness   PT Problem List Decreased strength;Decreased range of motion;Decreased activity tolerance;Decreased balance;Decreased mobility;Decreased coordination;Decreased cognition;Decreased knowledge of use of DME;Decreased safety awareness;Decreased knowledge of precautions;Impaired sensation;Decreased skin integrity  PT Treatment Interventions DME instruction;Gait training;Functional mobility training;Stair training;Therapeutic activities;Therapeutic exercise;Balance training;Neuromuscular re-education;Patient/family education   PT Goals (Current goals can be found in the Care Plan section) Acute Rehab PT Goals Patient Stated Goal: "to do any rehab that I can do" PT Goal Formulation: With patient Time For Goal Achievement: 12/04/14 Potential to Achieve Goals: Good    Frequency Min 2X/week   Barriers to discharge Decreased caregiver support;Inaccessible home environment      Co-evaluation               End of Session Equipment Utilized During Treatment: Gait belt Activity Tolerance: Patient tolerated treatment well Patient left: in chair;with call bell/phone within reach;with chair alarm set Nurse Communication: Mobility status (need for +2 with return to bed)         Time: 1610-9604 PT Time Calculation (min) (ACUTE ONLY): 20 min   Charges:   PT Evaluation $Initial PT Evaluation Tier I: 1 Procedure     PT G Codes:        Winslow Verrill H. Manson Passey, PT, DPT,  NCS 11/20/2014, 2:02 PM [phone]

## 2014-11-20 NOTE — Plan of Care (Signed)
Problem: Discharge Progression Outcomes Goal: Discharge plan in place and appropriate Individualization of care  Outcome: Progressing Individualization of Care Pt prefers to be called Molly [name] Hx of DM2, stroke, hemorrhoid, COPD, HTN, HLD, anxiety    Goal: Other Discharge Outcomes/Goals 1.  Pain - patient without complaints of pain this shift. 2.  Hemodynamically:  Patient admitted for sepsis and cellulitis - afebrile this shift.  VSS BP 148/66 mmHg  Pulse 94  Temp(Src) 98.2 F (36.8 C) (Oral)  Resp 20  Ht  (1.676 m)  Wt 265 lb 14.4 oz (120.611 kg)  BMI 42.94 kg/m2  SpO2 93% 3.  Complications:  Patient admitted for sepsis and cellulitis of left leg.  RN drew circle around cellulitis area for further assessment.  L foot wound infection due to 2 distal toes amputated in February.  Wound continues to be open with scant drainage.  Cleaned and then wrapped with Kerlex this shift. 4.  Diet:  Patient on Carb modified diet with sliding scale insulin coverage. 5.  Activity:  Patient with generalized weakness and right side weakness due to stroke 8 years ago.  Up with maximum assist to bedside commode this shift.

## 2014-11-20 NOTE — Clinical Social Work Note (Signed)
CSW spoke to pt.  She was alert and Ox4.  Pt gave CSW permission to send out bed referrals but not to speak with her family.  CSW was able to reserve bed at [facility] for pt's rehab and Berkley Harvey has been started.  CSW will f/u with full assessment and placement note tomorrow and FL2 is on chart for MD signature.

## 2014-11-20 NOTE — Progress Notes (Signed)
ANTIBIOTIC CONSULT NOTE - Follow Up  Pharmacy Consult for vancomycin and Zosyn dosing Indication: sepsis  No Known Allergies  Patient Measurements: Height:  (167.6 cm) Weight: 265 lb 14.4 oz (120.611 kg) IBW/kg (Calculated) : 59.3 Adjusted Body Weight: 84 kg  Vital Signs: Temp: 98.2 F (36.8 C) (08/23 0450) Temp Source: Oral (08/23 0450) BP: 148/66 mmHg (08/23 0450) Pulse Rate: 94 (08/23 0450) Intake/Output from previous day: 08/22 0701 - 08/23 0700 In: -  Out: 600 [Urine:600] Intake/Output from this shift:    Labs:  Recent Labs  11/19/14 1909 11/20/14 0059  WBC 18.5* 14.4*  HGB 12.2 12.2  PLT 236 230  CREATININE 0.88 1.09*   Estimated Creatinine Clearance: 65.3 mL/min (by C-G formula based on Cr of 1.09). No results for input(s): VANCOTROUGH, VANCOPEAK, VANCORANDOM, GENTTROUGH, GENTPEAK, GENTRANDOM, TOBRATROUGH, TOBRAPEAK, TOBRARND, AMIKACINPEAK, AMIKACINTROU, AMIKACIN in the last 72 hours.   Microbiology: Recent Results (from the past 720 hour(s))  Culture, blood (routine x 2)     Status: None (Preliminary result)   Collection Time: 11/19/14  6:09 PM  Result Value Ref Range Status   Specimen Description BLOOD LEFT ARM  Final   Special Requests BOTTLES DRAWN AEROBIC AND ANAEROBIC 8CCAER 5CC ANA  Final   Culture NO GROWTH < 12 HOURS  Final   Report Status PENDING  Incomplete  Culture, blood (routine x 2)     Status: None (Preliminary result)   Collection Time: 11/19/14  6:11 PM  Result Value Ref Range Status   Specimen Description BLOOD RIGHT HAND  Final   Special Requests BOTTLES DRAWN AEROBIC AND ANAEROBIC 8CC  Final   Culture NO GROWTH < 12 HOURS  Final   Report Status PENDING  Incomplete    Medical History: Past Medical History  Diagnosis Date  . Diabetes   . Stroke 2008    left weakness  . Hemorrhoid   . COPD (chronic obstructive pulmonary disease)   . HTN (hypertension)   . HLD (hyperlipidemia)   . Anxiety     Assessment: Patient is a  69 yo female with orders for Vancomycin 1250 mg IV q12h and Zosyn 4.5 gm IV q8h per EI protocol for treatment of sepsis/cellulitis/wound infection.  UA: (-) Blood and urine cx pending CXR: no acute disease  SCr: 1.01, est CrCl~65.3 mL/min, ke: 0.058, t1/2: 11.95 h, Vd: 58.8 L  Goal of Therapy:  Vancomycin trough level 15-20 mcg/ml  Plan:  Will continue patient on Zosyn 4.5 gm IV q8h per EI protocol based on TBW.    Will transition patient to Vancomycin 1 gm IV q12h as renal function has declined slightly and patient is at risk for accumulation based on body weight.  Will check trough prior to 5th total dose on 8/24 at 1230.  Pharmacy will continue to follow.  Clarisa Schools, PharmD Clinical Pharmacist 11/20/2014

## 2014-11-20 NOTE — Consult Note (Signed)
WOC wound consult note Reason for Consult:Nonhealing surgical wound to left lateral foot.  S/P amputation fourth and fifth metatarsals.  [facility] and that was discontinued about a month ago, per spouse.  Wound has been slow to progress.   Wound type:Chronic surgical wound.  Pressure Ulcer POA: N/A Measurement:2 open lesions to left lateral foot:  Distal 2 cm x 1 cm 100% scabbed wound bed, proximal 1 cm x 1 cm x 0.1 cm 100% pink and moist wound bed.  6cm x 6 cm intact erythema to left anterior calf.   Drainage (amount, consistency, odor) Moderate serosanguinous drainage.  No odor.  Periwound:Erythema, dry skin Dressing procedure/placement/frequency:Cleanse lesions to left lateral foot with NS and pat gently dry.  Apply Santyl ointment to wound bed, 1/8inch thickness (opaque).  Cover withNS moist gauze.  Cover with 4x4 and kerlix.  Secure with tape.  Change daily.  Will not follow at this time.  Please re-consult if needed.  Maple Hudson RN BSN CWON Pager [phone]

## 2014-11-20 NOTE — Plan of Care (Addendum)
Problem: Discharge Progression Outcomes Goal: Discharge plan in place and appropriate Individualization of care Likes to be called Steward Drone. Lives at home with husband. 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 Has history of diabetes, stroke, COPD and anxiety, controlled by home medications. Has had multiple falls, on high falls precautions per policy, offer toileting during hourly rounds.

## 2014-11-21 LAB — GLUCOSE, CAPILLARY
GLUCOSE-CAPILLARY: 226 mg/dL — AB (ref 65–99)
GLUCOSE-CAPILLARY: 266 mg/dL — AB (ref 65–99)
Glucose-Capillary: 241 mg/dL — ABNORMAL HIGH (ref 65–99)

## 2014-11-21 MED ORDER — AMOXICILLIN-POT CLAVULANATE 875-125 MG PO TABS: 1.0000 | ORAL_TABLET | Freq: Two times a day (BID) | ORAL | Status: DC

## 2014-11-21 NOTE — Progress Notes (Signed)
Patient discharged to [facility] with EMS transportation. Bo Mcclintock, RN

## 2014-11-21 NOTE — Progress Notes (Signed)
   11/21/14 1540  Follow Up  MD notified Enedina Finner  Time MD notified [phone]  Family notified Yes-comment  Time family notified 1540  Additional tests No

## 2014-11-21 NOTE — Discharge Instructions (Signed)
PT at rehab °

## 2014-11-21 NOTE — Discharge Summary (Signed)
[facility] [place] [facility]   PATIENT NAME: Molly [name]    MR#:  [id number]  DATE OF BIRTH:  [birth year]  DATE OF ADMISSION:  11/19/2014 ADMITTING PHYSICIAN: Oralia Manis, MD  DATE OF DISCHARGE: 11/21/14  PRIMARY CARE PHYSICIAN: Lennart Pall, MD    ADMISSION DIAGNOSIS:  Cellulitis of toe of left foot [L03.032] Cellulitis of left lower extremity [L03.116] Sepsis affecting skin [L02.91]  DISCHARGE DIAGNOSIS:  Chronic non healing left foot ulcer s/p debridement at bedside  SECONDARY DIAGNOSIS:   Past Medical History  Diagnosis Date  . Diabetes   . Stroke 2008    left weakness  . Hemorrhoid   . COPD (chronic obstructive pulmonary disease)   . HTN (hypertension)   . HLD (hyperlipidemia)   . Anxiety     HOSPITAL COURSE:   69 y.o. female who presents with sepsis secondary to left foot wound infection and cellulitis. Patient states that she had to 2 distal left toes amputated back in February of this year. Since that time she's been having difficulty with wound healing. She is diabetic on insulin at home  *Sepsis - mildly elevated lactic acid at 2.2, elevated white count, tachycardic on arrival. Patient got aggressive fluid hydration the ED, we'll continue this on admission for blood pressure support, trend lactic acid, -changed IV vanc and zosyn to po augmentin -BC neg -seen by podiatry and minimal debridement done at bedside  * left chronic Foot ulcer - wound care consult ordered, podiatry consult as above  * Diabetes mellitus - carb modified diet with sliding scale insulin and appropriate fingerstick glucose checks  * COPD (chronic obstructive pulmonary disease) - continue home meds  * HTN (hypertension) - hold home antihypertensives for now until her blood pressure improves aggressive fluids for blood pressure support as above  *HLD (hyperlipidemia) - continue home meds  Overall seem to be imrpoving. Will d/c to [facility] for  rehab DISCHARGE CONDITIONS:   fair CONSULTS OBTAINED:  Treatment Team:  Linus Galas, MD  DRUG ALLERGIES:  No Known Allergies  DISCHARGE MEDICATIONS:   Current Discharge Medication List    START taking these medications   Details  amoxicillin-clavulanate (AUGMENTIN) 875-125 MG per tablet Take 1 tablet by mouth every 12 (twelve) hours. Qty: 14 tablet, Refills: 0      CONTINUE these medications which have NOT CHANGED   Details  amitriptyline (ELAVIL) 50 MG tablet Take 50 mg by mouth daily.    Ascorbic Acid (VITAMIN C) 1000 MG tablet Take 1,000 mg by mouth daily.    aspirin EC 81 MG tablet Take 81 mg by mouth daily.    B Complex-C (SUPER B COMPLEX PO) Take 1 tablet by mouth daily.     Bilberry, Vaccinium myrtillus, (BILBERRY PO) Take 1 capsule by mouth daily.     calcium carbonate (OS-CAL) 600 MG TABS tablet Take 600 mg by mouth daily.    Cholecalciferol (VITAMIN D3) 1000 UNITS CAPS Take 1,000 Units by mouth daily.    clopidogrel (PLAVIX) 75 MG tablet Take 75 mg by mouth daily.    Coenzyme Q10 (COQ10 PO) Take 1 capsule by mouth daily.     docusate sodium (COLACE) 100 MG capsule Take 100 mg by mouth daily.    GLUCOSAMINE-CHONDROITIN PO Take 1 tablet by mouth 2 (two) times daily.     insulin aspart protamine- aspart (NOVOLOG MIX 70/30) (70-30) 100 UNIT/ML injection Inject 108 Units into the skin 2 (two) times daily.    losartan (COZAAR)  50 MG tablet Take 50 mg by mouth daily.    magnesium oxide (MAG-OX) 400 MG tablet Take 400 mg by mouth 2 (two) times daily.    metFORMIN (GLUCOPHAGE) 500 MG tablet Take 500 mg by mouth 2 (two) times daily.     Multiple Vitamins-Minerals (OCUVITE PO) Take 1 capsule by mouth daily.     naproxen sodium (ANAPROX) 220 MG tablet Take 220-440 mg by mouth 2 (two) times daily as needed (for pain).     niacin 500 MG tablet Take 500 mg by mouth daily.     Omega-3 Fatty Acids (FISH OIL) 1200 MG CAPS Take 1,200 mg by mouth daily.    VALERIAN  PO Take 1 tablet by mouth 2 (two) times daily.     vitamin E 400 UNIT capsule Take 400 Units by mouth daily.        If you experience worsening of your admission symptoms, develop shortness of breath, life threatening emergency, suicidal or homicidal thoughts you must seek medical attention immediately by calling 911 or calling your MD immediately  if symptoms less severe.  You Must read complete instructions/literature along with all the possible adverse reactions/side effects for all the Medicines you take and that have been prescribed to you. Take any new Medicines after you have completely understood and accept all the possible adverse reactions/side effects.   Please note  You were cared for by a hospitalist during your hospital stay. If you have any questions about your discharge medications or the care you received while you were in the hospital after you are discharged, you can call the unit and asked to speak with the hospitalist on call if the hospitalist that took care of you is not available. Once you are discharged, your primary care physician will handle any further medical issues. Please note that NO REFILLS for any discharge medications will be authorized once you are discharged, as it is imperative that you return to your primary care physician (or establish a relationship with a primary care physician if you do not have one) for your aftercare needs so that they can reassess your need for medications and monitor your lab values. Today   SUBJECTIVE   Doing well  VITAL SIGNS:  Blood pressure 142/62, pulse 100, temperature 97.9 F (36.6 C), temperature source Oral, resp. rate 20, height 5\' 6"  (1.676 m), weight 120.611 kg (265 lb 14.4 oz), SpO2 93 %.  I/O:   Intake/Output Summary (Last 24 hours) at 11/21/14 1130 Last data filed at 11/21/14 0744  Gross per 24 hour  Intake    483 ml  Output   1300 ml  Net   -817 ml    PHYSICAL EXAMINATION:  GENERAL:  69 y.o.-year-old  patient lying in the bed with no acute distress. obese EYES: Pupils equal, round, reactive to light and accommodation. No scleral icterus. Extraocular muscles intact.  HEENT: Head atraumatic, normocephalic. Oropharynx and nasopharynx clear.  NECK:  Supple, no jugular venous distention. No thyroid enlargement, no tenderness.  LUNGS: Normal breath sounds bilaterally, no wheezing, rales,rhonchi or crepitation. No use of accessory muscles of respiration.  CARDIOVASCULAR: S1, S2 normal. No murmurs, rubs, or gallops.  ABDOMEN: Soft, non-tender, non-distended. Bowel sounds present. No organomegaly or mass.  EXTREMITIES: No pedal edema, cyanosis, or clubbing.  Lateral 2 toes amputated on the left foot, with persistent open wound with mild drainage and surrounding cellulitis. No arthritis, no gout  Lymphadenopathy NEUROLOGIC: Cranial nerves II through XII are intact. Muscle strength 5/5 in  all extremities. Gait not checked.  PSYCHIATRIC: The patient is alert and oriented x 3.  SKIN: No obvious rash, lesion, or ulcer.   DATA REVIEW:   CBC   Recent Labs Lab 11/20/14 0059  WBC 14.4*  HGB 12.2  HCT 38.2  PLT 230    Chemistries   Recent Labs Lab 11/19/14 1909 11/20/14 0059  NA 132* 133*  K 4.3 4.2  CL 96* 94*  CO2 27 30  GLUCOSE 230* 220*  BUN 17 18  CREATININE 0.88 1.09*  CALCIUM 8.8* 8.8*  AST 31  --   ALT 22  --   ALKPHOS 56  --   BILITOT 0.6  --     Microbiology Results   Recent Results (from the past 240 hour(s))  Culture, blood (routine x 2)     Status: None (Preliminary result)   Collection Time: 11/19/14  6:09 PM  Result Value Ref Range Status   Specimen Description BLOOD LEFT ARM  Final   Special Requests BOTTLES DRAWN AEROBIC AND ANAEROBIC 8CCAER 5CC ANA  Final   Culture NO GROWTH 2 DAYS  Final   Report Status PENDING  Incomplete  Culture, blood (routine x 2)     Status: None (Preliminary result)   Collection Time: 11/19/14  6:11 PM  Result Value Ref Range  Status   Specimen Description BLOOD RIGHT HAND  Final   Special Requests BOTTLES DRAWN AEROBIC AND ANAEROBIC 8CC  Final   Culture NO GROWTH 2 DAYS  Final   Report Status PENDING  Incomplete    RADIOLOGY:  Dg Chest Portable 1 View  11/19/2014   CLINICAL DATA:  Fever and altered mental status. Wound and ulceration to the left lateral foot.  EXAM: PORTABLE CHEST - 1 VIEW  COMPARISON:  None.  FINDINGS: Shallow inspiration. Mild cardiac enlargement with normal pulmonary vascularity. No focal airspace disease or consolidation in the lungs. No blunting of costophrenic angles. No pneumothorax. Mediastinal contours appear intact. Degenerative changes in the spine and shoulders.  IMPRESSION: No active disease.   Electronically Signed   By: Burman Nieves M.D.   On: 11/19/2014 18:28   Dg Foot Complete Left  11/19/2014   CLINICAL DATA:  Fever and altered mental status. Wound on the left lateral foot.  EXAM: LEFT FOOT - COMPLETE 3+ VIEW  COMPARISON:  05/21/2014  FINDINGS: Previous amputation along the proximal aspect of the fourth and fifth metatarsal bones. No evidence for cortical destruction at the amputation sites. Severe spurring at the calcaneus. Degenerative changes along the dorsal aspect of the midfoot. No acute fracture.  IMPRESSION: Post amputation changes as described.  No acute bone abnormality.   Electronically Signed   By: Richarda Overlie M.D.   On: 11/19/2014 18:32     Management plans discussed with the patient, family and they are in agreement.  CODE STATUS:     Code Status Orders        Start     Ordered   11/19/14 2241  Full code   Continuous     11/19/14 2240      TOTAL TIME TAKING CARE OF THIS PATIENT: 40 minutes.    Idolina Mantell M.D on 11/21/2014 at 11:30 AM  Between 7am to 6pm - Pager - [phone] After 6pm go to [email or web address] - password [email or web address] [facility]  [facility]  Office  [phone]  CC: Primary care physician; Lennart Pall, MD

## 2014-11-21 NOTE — Progress Notes (Signed)
Report called to [facility]. EMS called for transport. Patient and husband are aware. Bo Mcclintock, RN

## 2014-11-21 NOTE — Care Management Important Message (Signed)
Important Message  Patient Details  Name: Molly [name] MRN: [id number] Date of Birth: September 02, 1945   Medicare Important Message Given:  Yes-second notification given    Verita Schneiders Allmond 11/21/2014, 9:42 AM

## 2014-11-21 NOTE — Clinical Social Work Placement (Signed)
   CLINICAL SOCIAL WORK PLACEMENT  NOTE  Date:  11/21/2014  Patient Details  Name: Molly [name] MRN: [id number] Date of Birth: 1945-07-11  Clinical Social Work is seeking post-discharge placement for this patient at the Skilled  Nursing Facility level of care (*CSW will initial, date and re-position this form in  chart as items are completed):  Yes   Patient/family provided with [place] Clinical Social Work Department's list of facilities offering this level of care within the geographic area requested by the patient (or if unable, by the patient's family).  Yes   Patient/family informed of their freedom to choose among providers that offer the needed level of care, that participate in Medicare, Medicaid or managed care program needed by the patient, have an available bed and are willing to accept the patient.  Yes   Patient/family informed of [facility]'s ownership interest in [facility] and [facility], as well as of the fact that they are under no obligation to receive care at these facilities.  PASRR submitted to EDS on 11/19/14     PASRR number received on 11/19/14     Existing PASRR number confirmed on       FL2 transmitted to all facilities in geographic area requested by pt/family on 11/19/14     FL2 transmitted to all facilities within larger geographic area on       Patient informed that his/her managed care company has contracts with or will negotiate with certain facilities, including the following:        Yes   Patient/family informed of bed offers received.  Patient chooses bed at  [facility])     Physician recommends and patient chooses bed at      Patient to be transferred to  [facility]) on 11/21/14.  Patient to be transferred to facility by  (EMS)     Patient family notified on   of transfer.  Name of family member notified:   (husband)     PHYSICIAN Please sign FL2     Additional Comment:     _______________________________________________ York Spaniel, LCSW 11/21/2014, 2:22 PM

## 2014-11-21 NOTE — Clinical Social Work Note (Signed)
Auth from [facility] received by [facility]. Patient is aware and is in agreement with discharge by MD today. Discharge summary sent to Debra at [facility].Patient is to transport via EMS. York Spaniel MSW,LCSW [phone]

## 2014-11-21 NOTE — Plan of Care (Signed)
Problem: Discharge Progression Outcomes Goal: Discharge plan in place and appropriate Individualization of care  Outcome: Progressing Individualization of Care Pt prefers to be called Steward Drone Hx of DM2, stroke, hemorrhoid, COPD, HTN, HLD, anxiety   Goal: Other Discharge Outcomes/Goals 1. Pain - patient without complaints of pain this shift. 2. Hemodynamically: Patient admitted for sepsis and cellulitis - afebrile this shift. VSS BP 140/67 mmHg  Pulse 100  Temp(Src) 98.2 F (36.8 C) (Oral)  Resp 20  Ht  (1.676 m)  Wt 265 lb 14.4 oz (120.611 kg)  BMI 42.94 kg/m2  SpO2 90% 3. Complications: Patient admitted for sepsis and cellulitis of left leg. RN drew circle around cellulitis area for further assessment last night.  Area is unchanged this shift. L foot wound infection due to 2 distal toes amputated in February. Wound continues to be open with scant drainage. Cleaned and then dressed during day shift by physician.  Dressing is wet to dry and order are to change daily. 4. Diet: Patient on Carb modified diet with sliding scale insulin coverage - given 2 units coverage this shift. 5. Activity: Patient with generalized weakness and left side weakness due to stroke 8 years ago. Up with maximum assist to bedside commode this shift.

## 2014-11-24 LAB — CULTURE, BLOOD (ROUTINE X 2)
Culture: NO GROWTH
Culture: NO GROWTH

## 2014-11-26 DIAGNOSIS — E1140 Type 2 diabetes mellitus with diabetic neuropathy, unspecified: Secondary | ICD-10-CM | POA: Diagnosis not present

## 2014-11-26 DIAGNOSIS — E11621 Type 2 diabetes mellitus with foot ulcer: Secondary | ICD-10-CM | POA: Diagnosis present

## 2014-11-26 DIAGNOSIS — M1990 Unspecified osteoarthritis, unspecified site: Secondary | ICD-10-CM | POA: Diagnosis not present

## 2014-11-26 DIAGNOSIS — Z89422 Acquired absence of other left toe(s): Secondary | ICD-10-CM | POA: Diagnosis not present

## 2014-11-26 DIAGNOSIS — G4730 Sleep apnea, unspecified: Secondary | ICD-10-CM | POA: Diagnosis not present

## 2014-11-26 DIAGNOSIS — I10 Essential (primary) hypertension: Secondary | ICD-10-CM | POA: Diagnosis not present

## 2014-11-26 DIAGNOSIS — J45909 Unspecified asthma, uncomplicated: Secondary | ICD-10-CM | POA: Diagnosis not present

## 2014-11-26 DIAGNOSIS — L03116 Cellulitis of left lower limb: Secondary | ICD-10-CM | POA: Diagnosis not present

## 2014-11-26 DIAGNOSIS — L97522 Non-pressure chronic ulcer of other part of left foot with fat layer exposed: Secondary | ICD-10-CM | POA: Diagnosis not present

## 2014-11-26 DIAGNOSIS — E1136 Type 2 diabetes mellitus with diabetic cataract: Secondary | ICD-10-CM | POA: Diagnosis not present

## 2014-12-10 ENCOUNTER — Encounter (HOSPITAL_BASED_OUTPATIENT_CLINIC_OR_DEPARTMENT_OTHER): Payer: Medicare HMO | Attending: Plastic Surgery

## 2014-12-10 DIAGNOSIS — L97521 Non-pressure chronic ulcer of other part of left foot limited to breakdown of skin: Secondary | ICD-10-CM | POA: Insufficient documentation

## 2014-12-10 DIAGNOSIS — G4730 Sleep apnea, unspecified: Secondary | ICD-10-CM | POA: Insufficient documentation

## 2014-12-10 DIAGNOSIS — I10 Essential (primary) hypertension: Secondary | ICD-10-CM | POA: Diagnosis not present

## 2014-12-10 DIAGNOSIS — E11621 Type 2 diabetes mellitus with foot ulcer: Secondary | ICD-10-CM | POA: Insufficient documentation

## 2014-12-10 DIAGNOSIS — E1140 Type 2 diabetes mellitus with diabetic neuropathy, unspecified: Secondary | ICD-10-CM | POA: Diagnosis not present

## 2014-12-10 DIAGNOSIS — J45909 Unspecified asthma, uncomplicated: Secondary | ICD-10-CM | POA: Diagnosis not present

## 2014-12-10 DIAGNOSIS — M1990 Unspecified osteoarthritis, unspecified site: Secondary | ICD-10-CM | POA: Insufficient documentation

## 2014-12-17 DIAGNOSIS — M1990 Unspecified osteoarthritis, unspecified site: Secondary | ICD-10-CM | POA: Diagnosis not present

## 2014-12-17 DIAGNOSIS — E11621 Type 2 diabetes mellitus with foot ulcer: Secondary | ICD-10-CM | POA: Diagnosis not present

## 2014-12-17 DIAGNOSIS — L97521 Non-pressure chronic ulcer of other part of left foot limited to breakdown of skin: Secondary | ICD-10-CM | POA: Diagnosis not present

## 2014-12-17 DIAGNOSIS — E1140 Type 2 diabetes mellitus with diabetic neuropathy, unspecified: Secondary | ICD-10-CM | POA: Diagnosis not present

## 2014-12-24 DIAGNOSIS — M1990 Unspecified osteoarthritis, unspecified site: Secondary | ICD-10-CM | POA: Diagnosis not present

## 2014-12-24 DIAGNOSIS — E1140 Type 2 diabetes mellitus with diabetic neuropathy, unspecified: Secondary | ICD-10-CM | POA: Diagnosis not present

## 2014-12-24 DIAGNOSIS — E11621 Type 2 diabetes mellitus with foot ulcer: Secondary | ICD-10-CM | POA: Diagnosis not present

## 2014-12-24 DIAGNOSIS — L97521 Non-pressure chronic ulcer of other part of left foot limited to breakdown of skin: Secondary | ICD-10-CM | POA: Diagnosis not present

## 2014-12-27 ENCOUNTER — Encounter: Payer: Self-pay | Admitting: Podiatry

## 2014-12-27 ENCOUNTER — Ambulatory Visit (INDEPENDENT_AMBULATORY_CARE_PROVIDER_SITE_OTHER): Payer: Medicare HMO | Admitting: Podiatry

## 2014-12-27 VITALS — BP 159/96 | HR 101 | Temp 97.0°F | Resp 18

## 2014-12-27 DIAGNOSIS — Q663 Other congenital varus deformities of feet, unspecified foot: Secondary | ICD-10-CM

## 2014-12-27 DIAGNOSIS — B351 Tinea unguium: Secondary | ICD-10-CM

## 2014-12-27 DIAGNOSIS — M79673 Pain in unspecified foot: Secondary | ICD-10-CM | POA: Diagnosis not present

## 2014-12-27 DIAGNOSIS — M21172 Varus deformity, not elsewhere classified, left ankle: Secondary | ICD-10-CM

## 2014-12-27 DIAGNOSIS — L600 Ingrowing nail: Secondary | ICD-10-CM | POA: Diagnosis not present

## 2014-12-27 DIAGNOSIS — L97521 Non-pressure chronic ulcer of other part of left foot limited to breakdown of skin: Secondary | ICD-10-CM

## 2014-12-27 DIAGNOSIS — M6281 Muscle weakness (generalized): Secondary | ICD-10-CM

## 2014-12-27 DIAGNOSIS — R2681 Unsteadiness on feet: Secondary | ICD-10-CM

## 2014-12-27 DIAGNOSIS — M216X2 Other acquired deformities of left foot: Secondary | ICD-10-CM

## 2014-12-27 DIAGNOSIS — E119 Type 2 diabetes mellitus without complications: Secondary | ICD-10-CM

## 2014-12-27 DIAGNOSIS — R262 Difficulty in walking, not elsewhere classified: Secondary | ICD-10-CM

## 2014-12-27 DIAGNOSIS — Z89422 Acquired absence of other left toe(s): Secondary | ICD-10-CM

## 2014-12-27 NOTE — Progress Notes (Signed)
Patient ID: Molly [name], female   DOB: 1945-11-25, 68 y.o.   MRN: [id number]  Subjective:  69 year old female presents the office for concerns of thick, painful, discolored toenails for which she has a little herself. She also gets ingrown toenails of both of her big toenails. She denies any redness or drainage from the nail sites. Since last appointment she has underwent left fourth and fifth ray partial amputations resulting in slow healing wound. She had a wound VAC is currently being treated with the wound care center for the wound. She has been going to physical therapy as well may concern the left ankle is rolling inwards more so over the last few weeks. They have tried 3 different braces which have not seemed to help. She has a prescription for diabetic shoes however she has been waiting on getting those and after the wound that she currently has heels. She is scheduled for the wound care center for her follow-up next week. She currently denies any systemic complaints as fevers, chills, nausea, vomiting. No calf pain, chest pain, shortness of breath.   Objective : AAO 3, NAD  DP/PT pulses palpable, CRT less than 3 seconds  Protective sensation decreased Simms Weinstein monofilament  There is been recent partial fourth and fifth ray amputations of the left foot. There is a small wound along the incision currently measuring 1.4 x 0.4 cm with a fibro-granular wound base. She has been applying Medi-honey to the wound. There is no probing, undermining, tunneling. There is no purulence expressed. There is chronic erythema around the wound which have subjectively not changed. They believe that this is from a contact dermatitis from the dressing has remained the same per the patient since going to wound care. There are no other open lesions or pre-ulcer lesions identified bilaterally.  Nails are hypertrophic, dystrophic, brittle, discolored, elongated to nails 1-5 on the right and 1-3 on the left. There  is incurvation of both the medial and lateral nail borders of bilateral hallux. There is tenderness to palpation around the nails. There is no surrounding erythema, edema, drainage/purulence.  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 This was present prior however it seems to be worsening.  There is no pain with calf compression, swelling, warmth, erythema.   Assessment : 69 year old female with symptomatic onychomycosis, slow healing ulceration left foot and equino- varus deformity left foot   Plan: -Treatment options discussed including all alternatives, risks, and complications -[name] defer the wound care to the wound care center as she is actively seeking treatment with them. Continue with Medi-honey per their recommendations.  -Nails are sharply debrided 8 without complication/bleeding.  -I recommend her to follow-up with our certified  Pedorthoist, Molly [name], for evaluation of a possible brace or to see if there is one that she already has can be modified. I encouraged her to bring the braces that she has with her to this appointment for evaluation. We tried a standard Tri-Lock ankle brace today however the brace was putting pressure over the wound.  -Follow-up with me in 3 months for routine care however she'll follow-up with Molly [name] at her next clinic today. I [name] discuss with Molly [name] during her appointment what would be an optimal brace for her. In the meantime I encouraged her to call the office with any questions, concerns, change in symptoms.

## 2015-01-07 ENCOUNTER — Encounter (HOSPITAL_BASED_OUTPATIENT_CLINIC_OR_DEPARTMENT_OTHER): Payer: Medicare HMO | Attending: Plastic Surgery

## 2015-01-07 DIAGNOSIS — Z89432 Acquired absence of left foot: Secondary | ICD-10-CM | POA: Insufficient documentation

## 2015-01-07 DIAGNOSIS — L97521 Non-pressure chronic ulcer of other part of left foot limited to breakdown of skin: Secondary | ICD-10-CM | POA: Insufficient documentation

## 2015-01-07 DIAGNOSIS — G4730 Sleep apnea, unspecified: Secondary | ICD-10-CM | POA: Diagnosis not present

## 2015-01-07 DIAGNOSIS — E1140 Type 2 diabetes mellitus with diabetic neuropathy, unspecified: Secondary | ICD-10-CM | POA: Diagnosis not present

## 2015-01-07 DIAGNOSIS — M1990 Unspecified osteoarthritis, unspecified site: Secondary | ICD-10-CM | POA: Diagnosis not present

## 2015-01-07 DIAGNOSIS — E11621 Type 2 diabetes mellitus with foot ulcer: Secondary | ICD-10-CM | POA: Insufficient documentation

## 2015-01-07 DIAGNOSIS — H269 Unspecified cataract: Secondary | ICD-10-CM | POA: Diagnosis not present

## 2015-01-07 DIAGNOSIS — I10 Essential (primary) hypertension: Secondary | ICD-10-CM | POA: Diagnosis not present

## 2015-01-07 DIAGNOSIS — J45909 Unspecified asthma, uncomplicated: Secondary | ICD-10-CM | POA: Diagnosis not present

## 2015-01-10 ENCOUNTER — Ambulatory Visit: Payer: Medicare HMO

## 2015-01-21 DIAGNOSIS — E11621 Type 2 diabetes mellitus with foot ulcer: Secondary | ICD-10-CM | POA: Diagnosis not present

## 2015-01-21 DIAGNOSIS — E1140 Type 2 diabetes mellitus with diabetic neuropathy, unspecified: Secondary | ICD-10-CM | POA: Diagnosis not present

## 2015-01-21 DIAGNOSIS — L97521 Non-pressure chronic ulcer of other part of left foot limited to breakdown of skin: Secondary | ICD-10-CM | POA: Diagnosis not present

## 2015-01-21 DIAGNOSIS — J45909 Unspecified asthma, uncomplicated: Secondary | ICD-10-CM | POA: Diagnosis not present

## 2015-01-28 DIAGNOSIS — E11621 Type 2 diabetes mellitus with foot ulcer: Secondary | ICD-10-CM | POA: Diagnosis not present

## 2015-02-04 ENCOUNTER — Encounter (HOSPITAL_BASED_OUTPATIENT_CLINIC_OR_DEPARTMENT_OTHER): Payer: Medicare HMO | Attending: Plastic Surgery

## 2015-02-04 DIAGNOSIS — M1990 Unspecified osteoarthritis, unspecified site: Secondary | ICD-10-CM | POA: Insufficient documentation

## 2015-02-04 DIAGNOSIS — J45909 Unspecified asthma, uncomplicated: Secondary | ICD-10-CM | POA: Insufficient documentation

## 2015-02-04 DIAGNOSIS — E11621 Type 2 diabetes mellitus with foot ulcer: Secondary | ICD-10-CM | POA: Insufficient documentation

## 2015-02-04 DIAGNOSIS — I10 Essential (primary) hypertension: Secondary | ICD-10-CM | POA: Insufficient documentation

## 2015-02-04 DIAGNOSIS — E1140 Type 2 diabetes mellitus with diabetic neuropathy, unspecified: Secondary | ICD-10-CM | POA: Insufficient documentation

## 2015-02-04 DIAGNOSIS — Y835 Amputation of limb(s) as the cause of abnormal reaction of the patient, or of later complication, without mention of misadventure at the time of the procedure: Secondary | ICD-10-CM | POA: Insufficient documentation

## 2015-02-04 DIAGNOSIS — T8781 Dehiscence of amputation stump: Secondary | ICD-10-CM | POA: Insufficient documentation

## 2015-02-04 DIAGNOSIS — L97501 Non-pressure chronic ulcer of other part of unspecified foot limited to breakdown of skin: Secondary | ICD-10-CM | POA: Insufficient documentation

## 2015-02-04 DIAGNOSIS — Z89422 Acquired absence of other left toe(s): Secondary | ICD-10-CM | POA: Insufficient documentation

## 2015-02-04 DIAGNOSIS — G4730 Sleep apnea, unspecified: Secondary | ICD-10-CM | POA: Insufficient documentation

## 2015-02-07 ENCOUNTER — Ambulatory Visit: Payer: Medicare HMO | Admitting: *Deleted

## 2015-02-07 DIAGNOSIS — M216X2 Other acquired deformities of left foot: Secondary | ICD-10-CM

## 2015-02-07 DIAGNOSIS — Z89422 Acquired absence of other left toe(s): Secondary | ICD-10-CM

## 2015-02-07 DIAGNOSIS — M79673 Pain in unspecified foot: Secondary | ICD-10-CM

## 2015-02-07 NOTE — Progress Notes (Signed)
Patient ID: Molly [name], female   DOB: 08/19/1945, 69 y.o.   MRN: [id number] Patient [facility] [place] [facility]. Written and verbal break in instructions given. Patient will follow up in 6 weeks with Dr. Ardelle AntonWagoner.

## 2015-02-11 DIAGNOSIS — Y835 Amputation of limb(s) as the cause of abnormal reaction of the patient, or of later complication, without mention of misadventure at the time of the procedure: Secondary | ICD-10-CM | POA: Diagnosis not present

## 2015-02-11 DIAGNOSIS — T8781 Dehiscence of amputation stump: Secondary | ICD-10-CM | POA: Diagnosis not present

## 2015-02-11 DIAGNOSIS — E11621 Type 2 diabetes mellitus with foot ulcer: Secondary | ICD-10-CM | POA: Diagnosis not present

## 2015-02-11 DIAGNOSIS — M1990 Unspecified osteoarthritis, unspecified site: Secondary | ICD-10-CM | POA: Diagnosis not present

## 2015-02-11 DIAGNOSIS — E1140 Type 2 diabetes mellitus with diabetic neuropathy, unspecified: Secondary | ICD-10-CM | POA: Diagnosis not present

## 2015-02-11 DIAGNOSIS — I10 Essential (primary) hypertension: Secondary | ICD-10-CM | POA: Diagnosis not present

## 2015-02-11 DIAGNOSIS — Z89422 Acquired absence of other left toe(s): Secondary | ICD-10-CM | POA: Diagnosis not present

## 2015-02-11 DIAGNOSIS — G4730 Sleep apnea, unspecified: Secondary | ICD-10-CM | POA: Diagnosis not present

## 2015-02-11 DIAGNOSIS — J45909 Unspecified asthma, uncomplicated: Secondary | ICD-10-CM | POA: Diagnosis not present

## 2015-02-11 DIAGNOSIS — L97501 Non-pressure chronic ulcer of other part of unspecified foot limited to breakdown of skin: Secondary | ICD-10-CM | POA: Diagnosis not present

## 2015-02-18 DIAGNOSIS — E11621 Type 2 diabetes mellitus with foot ulcer: Secondary | ICD-10-CM | POA: Diagnosis not present

## 2015-02-25 DIAGNOSIS — L97501 Non-pressure chronic ulcer of other part of unspecified foot limited to breakdown of skin: Secondary | ICD-10-CM | POA: Diagnosis not present

## 2015-02-25 DIAGNOSIS — T8781 Dehiscence of amputation stump: Secondary | ICD-10-CM | POA: Diagnosis not present

## 2015-02-25 DIAGNOSIS — Z89422 Acquired absence of other left toe(s): Secondary | ICD-10-CM | POA: Diagnosis not present

## 2015-02-25 DIAGNOSIS — E11621 Type 2 diabetes mellitus with foot ulcer: Secondary | ICD-10-CM | POA: Diagnosis not present

## 2015-03-11 ENCOUNTER — Encounter (HOSPITAL_BASED_OUTPATIENT_CLINIC_OR_DEPARTMENT_OTHER): Payer: Medicare HMO | Attending: Plastic Surgery

## 2015-03-11 DIAGNOSIS — M1990 Unspecified osteoarthritis, unspecified site: Secondary | ICD-10-CM | POA: Diagnosis not present

## 2015-03-11 DIAGNOSIS — J45909 Unspecified asthma, uncomplicated: Secondary | ICD-10-CM | POA: Insufficient documentation

## 2015-03-11 DIAGNOSIS — L97501 Non-pressure chronic ulcer of other part of unspecified foot limited to breakdown of skin: Secondary | ICD-10-CM | POA: Diagnosis not present

## 2015-03-11 DIAGNOSIS — E1140 Type 2 diabetes mellitus with diabetic neuropathy, unspecified: Secondary | ICD-10-CM | POA: Diagnosis not present

## 2015-03-11 DIAGNOSIS — Z89422 Acquired absence of other left toe(s): Secondary | ICD-10-CM | POA: Diagnosis not present

## 2015-03-11 DIAGNOSIS — E11621 Type 2 diabetes mellitus with foot ulcer: Secondary | ICD-10-CM | POA: Insufficient documentation

## 2015-03-11 DIAGNOSIS — I10 Essential (primary) hypertension: Secondary | ICD-10-CM | POA: Diagnosis not present

## 2015-03-11 DIAGNOSIS — G4730 Sleep apnea, unspecified: Secondary | ICD-10-CM | POA: Diagnosis not present

## 2015-03-15 ENCOUNTER — Ambulatory Visit
Admission: RE | Admit: 2015-03-15 | Discharge: 2015-03-15 | Disposition: A | Discharge status: Home / Self Care | Payer: Medicare HMO | Source: Ambulatory Visit | Attending: Ophthalmology | Admitting: Ophthalmology

## 2015-03-15 ENCOUNTER — Other Ambulatory Visit: Payer: Self-pay | Admitting: Ophthalmology

## 2015-03-15 DIAGNOSIS — X58XXXA Exposure to other specified factors, initial encounter: Secondary | ICD-10-CM | POA: Insufficient documentation

## 2015-03-15 DIAGNOSIS — H0589 Other disorders of orbit: Secondary | ICD-10-CM | POA: Diagnosis not present

## 2015-03-15 DIAGNOSIS — W19XXXA Unspecified fall, initial encounter: Secondary | ICD-10-CM | POA: Diagnosis not present

## 2015-03-15 DIAGNOSIS — S0230XA Fracture of orbital floor, unspecified side, initial encounter for closed fracture: Secondary | ICD-10-CM | POA: Diagnosis present

## 2015-03-15 DIAGNOSIS — S0232XA Fracture of orbital floor, left side, initial encounter for closed fracture: Secondary | ICD-10-CM | POA: Diagnosis not present

## 2015-03-21 ENCOUNTER — Ambulatory Visit (INDEPENDENT_AMBULATORY_CARE_PROVIDER_SITE_OTHER): Payer: Medicare HMO | Admitting: Podiatry

## 2015-03-21 ENCOUNTER — Encounter: Payer: Self-pay | Admitting: Podiatry

## 2015-03-21 ENCOUNTER — Ambulatory Visit: Payer: Medicare HMO | Admitting: Podiatry

## 2015-03-21 DIAGNOSIS — M79673 Pain in unspecified foot: Secondary | ICD-10-CM | POA: Diagnosis not present

## 2015-03-21 DIAGNOSIS — B351 Tinea unguium: Secondary | ICD-10-CM

## 2015-03-21 DIAGNOSIS — L600 Ingrowing nail: Secondary | ICD-10-CM

## 2015-03-21 DIAGNOSIS — E1149 Type 2 diabetes mellitus with other diabetic neurological complication: Secondary | ICD-10-CM

## 2015-03-21 NOTE — Progress Notes (Signed)
Patient ID: Molly [name], female   DOB: 03-20-1946, 69 y.o.   MRN: [id number]  Subjective: 69 year old female presents the office today for follow-up evaluation after AFO [place] brace was dispensed. She states that overall the brace is helping.  She states that her foot still also turned some no where near as what it was previously. She is scheduled to restart physical therapy in January. 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 He said the wound is about healed wounds, long way. She is also has [place] to be trimmed today as they are thickened elongated and causing ingrown toenails. No surrounding erythema or drainage. No open sores. No other complaints at this time.  Objective: AAO 3, NAD DP/PT pulses palpable,  CRT less than 3 seconds Protective sensation decreased with Simms Weinstein monofilament Small hyperkeratotic lesion on the site of the previous fourth and fifth ray  Amputations which is always healed at this time. There is chronic erythema around the area however this is remained unchanged per the patient and this is over the area for the previous wound with that. There is no drainage or pus or ascending  Cellulitis. No clinical signs of infection.  Nails appear to be some early hypertrophic and dystrophic, incurvated and elongated with tenderness up patient. Nails 1-3 on the left and 1-5 on the right. No swelling erythema or drainage. There is no other open lesions or pre-ulcerative lesions. No pain with calf compression, slight, warmth, erythema.  Assessment:  69 year old female with symptomatic onychomycosis ,  Equino varus left foot  Plan: -Treatment options discussed including all alternatives, risks, and complications -Nails sharply debrided 8 without, complications/bleeding -Continued [place] brace. I'll have her follow-up with Betha in January to see the  braceneeds to be modified  before starting physical therapy. -Daily foot inspection -Continue follow-up with wound care center. -Follow-up 3 months or sooner if any problems arise. In the meantime, encouraged to call the office with any questions, concerns, change in symptoms.   Molly [name], DPM

## 2015-04-02 ENCOUNTER — Encounter (HOSPITAL_BASED_OUTPATIENT_CLINIC_OR_DEPARTMENT_OTHER): Payer: Medicare HMO | Attending: Internal Medicine

## 2015-04-02 DIAGNOSIS — G4730 Sleep apnea, unspecified: Secondary | ICD-10-CM | POA: Diagnosis not present

## 2015-04-02 DIAGNOSIS — E11621 Type 2 diabetes mellitus with foot ulcer: Secondary | ICD-10-CM | POA: Insufficient documentation

## 2015-04-02 DIAGNOSIS — Z89422 Acquired absence of other left toe(s): Secondary | ICD-10-CM | POA: Diagnosis not present

## 2015-04-02 DIAGNOSIS — L97501 Non-pressure chronic ulcer of other part of unspecified foot limited to breakdown of skin: Secondary | ICD-10-CM | POA: Diagnosis not present

## 2015-04-02 DIAGNOSIS — J45909 Unspecified asthma, uncomplicated: Secondary | ICD-10-CM | POA: Insufficient documentation

## 2015-04-02 DIAGNOSIS — I10 Essential (primary) hypertension: Secondary | ICD-10-CM | POA: Diagnosis not present

## 2015-04-02 DIAGNOSIS — E1140 Type 2 diabetes mellitus with diabetic neuropathy, unspecified: Secondary | ICD-10-CM | POA: Diagnosis not present

## 2015-04-02 DIAGNOSIS — M1990 Unspecified osteoarthritis, unspecified site: Secondary | ICD-10-CM | POA: Diagnosis not present

## 2015-04-03 ENCOUNTER — Ambulatory Visit (INDEPENDENT_AMBULATORY_CARE_PROVIDER_SITE_OTHER): Payer: Medicare HMO | Admitting: *Deleted

## 2015-04-15 DIAGNOSIS — E11621 Type 2 diabetes mellitus with foot ulcer: Secondary | ICD-10-CM | POA: Diagnosis not present

## 2015-04-18 ENCOUNTER — Encounter: Payer: Self-pay | Admitting: Podiatry

## 2015-04-18 ENCOUNTER — Ambulatory Visit (INDEPENDENT_AMBULATORY_CARE_PROVIDER_SITE_OTHER): Payer: Medicare HMO | Admitting: Podiatry

## 2015-04-18 DIAGNOSIS — E119 Type 2 diabetes mellitus without complications: Secondary | ICD-10-CM

## 2015-04-18 DIAGNOSIS — E1149 Type 2 diabetes mellitus with other diabetic neurological complication: Secondary | ICD-10-CM | POA: Diagnosis not present

## 2015-04-18 DIAGNOSIS — L97521 Non-pressure chronic ulcer of other part of left foot limited to breakdown of skin: Secondary | ICD-10-CM

## 2015-04-18 DIAGNOSIS — Z899 Acquired absence of limb, unspecified: Secondary | ICD-10-CM | POA: Diagnosis not present

## 2015-04-18 DIAGNOSIS — M2040 Other hammer toe(s) (acquired), unspecified foot: Secondary | ICD-10-CM

## 2015-04-18 NOTE — Progress Notes (Signed)
Patient ID: MARQUESA RATH, female   DOB: [birth year], 70 y.o.   MRN: [id number]  Subjective: 70 year old female presents the office today requesting diabetic shoes. She states the areas and the brace is doing good that she'll to wear diabetic she was well. She still going to wound care for the wound to her left foot which is almost healed. She states that her foot discontinue turn the left side and the brace does help. No other open sores at this time. No redness or drainage on the toenails. No pain to the toenails this time. No other complaints at this time.  Objective: AAO 3, NAD DP/PT pulses palpable,  CRT less than 3 seconds Protective sensation decreased with Simms Weinstein monofilament Bandage intact from the wound care center. Upon removal there is a very small superficial wound on the dorsolateral aspect the left foot on the previous dictation site. There is no swelling erythema, ascending cellulitis, drainage or pus. No clinical signs of infection. The foot does sit in equinus and varus position and hammertoes are present. No other open lesions or pre-ulcerative lesions identified bilaterally. No pain with calf compression, slight, warmth, erythema.  Assessment:  70 year old female requesting diabetic shoes  Plan: -Treatment options discussed including all alternatives, risks, and complications -Wound was redressed the left foot. Continue daily dressing changes per the wound care center. -Paperwork for diabetic shoes is completed for precertification. I will have her back to see [facility] for measurements. -Daily foot inspection -Continue follow-up with wound care center. -Follow-up 3 months or sooner if any problems arise. In the meantime, encouraged to call the office with any questions, concerns, change in symptoms.   Ovid Curd, DPM

## 2015-04-29 DIAGNOSIS — E11621 Type 2 diabetes mellitus with foot ulcer: Secondary | ICD-10-CM | POA: Diagnosis not present

## 2015-05-01 ENCOUNTER — Ambulatory Visit (INDEPENDENT_AMBULATORY_CARE_PROVIDER_SITE_OTHER): Payer: Medicare HMO | Admitting: *Deleted

## 2015-05-01 DIAGNOSIS — M2040 Other hammer toe(s) (acquired), unspecified foot: Secondary | ICD-10-CM

## 2015-05-01 DIAGNOSIS — E1149 Type 2 diabetes mellitus with other diabetic neurological complication: Secondary | ICD-10-CM

## 2015-05-01 DIAGNOSIS — Z899 Acquired absence of limb, unspecified: Secondary | ICD-10-CM

## 2015-05-03 NOTE — Progress Notes (Signed)
Measured for diabetic shoes and insoles. 

## 2015-05-13 ENCOUNTER — Encounter (HOSPITAL_BASED_OUTPATIENT_CLINIC_OR_DEPARTMENT_OTHER): Payer: Medicare HMO | Attending: Internal Medicine

## 2015-05-13 DIAGNOSIS — Z09 Encounter for follow-up examination after completed treatment for conditions other than malignant neoplasm: Secondary | ICD-10-CM | POA: Diagnosis present

## 2015-05-13 DIAGNOSIS — Z8631 Personal history of diabetic foot ulcer: Secondary | ICD-10-CM | POA: Diagnosis not present

## 2015-06-20 ENCOUNTER — Ambulatory Visit (INDEPENDENT_AMBULATORY_CARE_PROVIDER_SITE_OTHER): Payer: Medicare HMO | Admitting: Podiatry

## 2015-06-20 ENCOUNTER — Encounter: Payer: Self-pay | Admitting: Podiatry

## 2015-06-20 DIAGNOSIS — B351 Tinea unguium: Secondary | ICD-10-CM | POA: Diagnosis not present

## 2015-06-20 DIAGNOSIS — E1149 Type 2 diabetes mellitus with other diabetic neurological complication: Secondary | ICD-10-CM

## 2015-06-20 DIAGNOSIS — M79674 Pain in right toe(s): Secondary | ICD-10-CM

## 2015-06-20 DIAGNOSIS — M79675 Pain in left toe(s): Secondary | ICD-10-CM | POA: Diagnosis not present

## 2015-06-20 DIAGNOSIS — L600 Ingrowing nail: Secondary | ICD-10-CM

## 2015-06-20 NOTE — Progress Notes (Signed)
Patient ID: Molly HughsBrenda P [name], female   DOB: [birth year], 70 y.o.   MRN: [id number]  Subjective: 70 y.o. returns the office today for painful, elongated, thickened toenails which she cannot trim herself. Denies any redness or drainage around the nails. She is discontinuing the wound care center as the wound the outside portion of left foot is healed. Denies any acute changes since last appointment and no new complaints today. Denies any systemic complaints such as fevers, chills, nausea, vomiting.   Objective: AAO 3, NAD DP/PT pulses palpable, CRT less than 3 seconds Protective sensation decreased with Simms Weinstein monofilament Nails hypertrophic, dystrophic, elongated, brittle, discolored 8. There is incurvation of both the medial and lateral nail borders bilateral hallux. There is tenderness overlying the nails 1-5 bilaterally. There is no surrounding erythema or drainage along the nail sites.  No open lesions or pre-ulcerative lesions are identified. Previous invitational left fourth and fifth toes. A scab is overlying the lateral aspect of the foot along the scar. No swelling erythema, ascending cellulitis, drainage or pus. No other areas of tenderness bilateral lower extremities. No overlying edema, erythema, increased warmth. No pain with calf compression, swelling, warmth, erythema.  Assessment: Patient presents with symptomatic onychomycosis  Plan: -Treatment options including alternatives, risks, complications were discussed -Nails sharply debrided 8 without complication/bleeding. -Discussed daily foot inspection. If there are any changes, to call the office immediately.  -Follow-up in 3 months or sooner if any problems are to arise. In the meantime, encouraged to call the office with any questions, concerns, changes symptoms.  Ovid CurdMatthew Wagoner, DPM

## 2015-07-31 ENCOUNTER — Encounter: Payer: Self-pay | Admitting: Emergency Medicine

## 2015-07-31 ENCOUNTER — Emergency Department: Payer: Medicare HMO

## 2015-07-31 ENCOUNTER — Ambulatory Visit: Payer: Medicare HMO

## 2015-07-31 ENCOUNTER — Emergency Department
Admission: EM | Admit: 2015-07-31 | Discharge: 2015-07-31 | Disposition: A | Discharge status: Home / Self Care | Payer: Medicare HMO | Attending: Emergency Medicine | Admitting: Emergency Medicine

## 2015-07-31 DIAGNOSIS — Y999 Unspecified external cause status: Secondary | ICD-10-CM | POA: Insufficient documentation

## 2015-07-31 DIAGNOSIS — E119 Type 2 diabetes mellitus without complications: Secondary | ICD-10-CM | POA: Diagnosis not present

## 2015-07-31 DIAGNOSIS — Y9389 Activity, other specified: Secondary | ICD-10-CM | POA: Insufficient documentation

## 2015-07-31 DIAGNOSIS — E0789 Other specified disorders of thyroid: Secondary | ICD-10-CM

## 2015-07-31 DIAGNOSIS — I10 Essential (primary) hypertension: Secondary | ICD-10-CM | POA: Insufficient documentation

## 2015-07-31 DIAGNOSIS — Z7982 Long term (current) use of aspirin: Secondary | ICD-10-CM | POA: Insufficient documentation

## 2015-07-31 DIAGNOSIS — Z7984 Long term (current) use of oral hypoglycemic drugs: Secondary | ICD-10-CM | POA: Insufficient documentation

## 2015-07-31 DIAGNOSIS — Z87891 Personal history of nicotine dependence: Secondary | ICD-10-CM | POA: Diagnosis not present

## 2015-07-31 DIAGNOSIS — Z794 Long term (current) use of insulin: Secondary | ICD-10-CM | POA: Insufficient documentation

## 2015-07-31 DIAGNOSIS — E785 Hyperlipidemia, unspecified: Secondary | ICD-10-CM | POA: Insufficient documentation

## 2015-07-31 DIAGNOSIS — E041 Nontoxic single thyroid nodule: Secondary | ICD-10-CM | POA: Insufficient documentation

## 2015-07-31 DIAGNOSIS — S0083XA Contusion of other part of head, initial encounter: Secondary | ICD-10-CM

## 2015-07-31 DIAGNOSIS — Z79899 Other long term (current) drug therapy: Secondary | ICD-10-CM | POA: Insufficient documentation

## 2015-07-31 DIAGNOSIS — S0990XA Unspecified injury of head, initial encounter: Secondary | ICD-10-CM | POA: Diagnosis present

## 2015-07-31 DIAGNOSIS — Z8673 Personal history of transient ischemic attack (TIA), and cerebral infarction without residual deficits: Secondary | ICD-10-CM | POA: Insufficient documentation

## 2015-07-31 DIAGNOSIS — W1830XA Fall on same level, unspecified, initial encounter: Secondary | ICD-10-CM | POA: Insufficient documentation

## 2015-07-31 DIAGNOSIS — J449 Chronic obstructive pulmonary disease, unspecified: Secondary | ICD-10-CM | POA: Diagnosis not present

## 2015-07-31 DIAGNOSIS — Y9289 Other specified places as the place of occurrence of the external cause: Secondary | ICD-10-CM | POA: Insufficient documentation

## 2015-07-31 LAB — GLUCOSE, CAPILLARY: GLUCOSE-CAPILLARY: 179 mg/dL — AB (ref 65–99)

## 2015-07-31 NOTE — ED Provider Notes (Signed)
[facility] Emergency Department Provider Note  ____________________________________________  Time seen: Approximately 12:09 PM  I have reviewed the triage vital signs and the nursing notes.   HISTORY  Chief Complaint Fall    HPI Molly [name] is a 70 y.o. female history of diabetes, COPD, and a previous stroke that left her with left-sided weakness.  Patient reports that today she was walking with her cane, she turned around a piece of exercise equipment and caught her foot upon it. She fell forward and struck her head on the side of the exercise equipment. She reports she has swelling and pain over the left side of the 4 head. No other injury.  She reports that she does fall regularly, she has some mobility issues due to her previous stroke. She has otherwise been in good health.  No new numbness weakness. No chest pain or trouble breathing. No fevers abdominal pain or recent illness.  She is not having any pain in the hips or the legs. Or the arms.   Past Medical History  Diagnosis Date  . Diabetes (HCC)   . Stroke [facility]) 2008    left weakness  . Hemorrhoid   . COPD (chronic obstructive pulmonary disease) (HCC)   . HTN (hypertension)   . HLD (hyperlipidemia)   . Anxiety     Patient Active Problem List   Diagnosis Date Noted  . Cellulitis of left lower extremity 11/19/2014  . COPD (chronic obstructive pulmonary disease) (HCC) 11/19/2014  . HTN (hypertension) 11/19/2014  . HLD (hyperlipidemia) 11/19/2014  . Sepsis (HCC) 11/19/2014  . Cellulitis of left foot 05/21/2014  . Diabetes mellitus (HCC) 05/21/2014  . Chronic anemia 05/21/2014  . Osteomyelitis of left foot (HCC) 05/21/2014  . Foot ulcer (HCC) 02/04/2014    Past Surgical History  Procedure Laterality Date  . Colonoscopy  2004  . Cataract extraction    . Amputation Left 05/22/2014    Procedure: AMPUTATION RAY LEFT FOURTH AND FIFTH ;  Surgeon: Toni Arthurs, MD;  Location: [facility] OR;   Service: Orthopedics;  Laterality: Left;    Current Outpatient Rx  Name  Route  Sig  Dispense  Refill  . amitriptyline (ELAVIL) 50 MG tablet   Oral   Take 50 mg by mouth daily.         Marland Kitchen amoxicillin-clavulanate (AUGMENTIN) 875-125 MG per tablet   Oral   Take 1 tablet by mouth every 12 (twelve) hours.   14 tablet   0   . Ascorbic Acid (VITAMIN C) 1000 MG tablet   Oral   Take 1,000 mg by mouth daily.         Marland Kitchen aspirin EC 81 MG tablet   Oral   Take 81 mg by mouth daily.         . B Complex-C (SUPER B COMPLEX PO)   Oral   Take 1 tablet by mouth daily.          . Bilberry, Vaccinium myrtillus, (BILBERRY PO)   Oral   Take 1 capsule by mouth daily.          . calcium carbonate (OS-CAL) 600 MG TABS tablet   Oral   Take 600 mg by mouth daily.         . Cholecalciferol (VITAMIN D3) 1000 UNITS CAPS   Oral   Take 1,000 Units by mouth daily.         . clopidogrel (PLAVIX) 75 MG tablet   Oral   Take 75 mg  by mouth daily.         . Coenzyme Q10 (COQ10 PO)   Oral   Take 1 capsule by mouth daily.          Marland Kitchen docusate sodium (COLACE) 100 MG capsule   Oral   Take 100 mg by mouth daily.         Marland Kitchen GLUCOSAMINE-CHONDROITIN PO   Oral   Take 1 tablet by mouth 2 (two) times daily.          . insulin aspart protamine- aspart (NOVOLOG MIX 70/30) (70-30) 100 UNIT/ML injection   Subcutaneous   Inject 108 Units into the skin 2 (two) times daily.         Marland Kitchen losartan (COZAAR) 50 MG tablet   Oral   Take 50 mg by mouth daily.         . magnesium oxide (MAG-OX) 400 MG tablet   Oral   Take 400 mg by mouth 2 (two) times daily.         . metFORMIN (GLUCOPHAGE) 500 MG tablet   Oral   Take 500 mg by mouth 2 (two) times daily.          . Multiple Vitamins-Minerals (OCUVITE PO)   Oral   Take 1 capsule by mouth daily.          . naproxen sodium (ANAPROX) 220 MG tablet   Oral   Take 220-440 mg by mouth 2 (two) times daily as needed (for pain).           . niacin 500 MG tablet   Oral   Take 500 mg by mouth daily.          . Omega-3 Fatty Acids (FISH OIL) 1200 MG CAPS   Oral   Take 1,200 mg by mouth daily.         Marland Kitchen VALERIAN PO   Oral   Take 1 tablet by mouth 2 (two) times daily.          . vitamin E 400 UNIT capsule   Oral   Take 400 Units by mouth daily.           Allergies Review of patient's allergies indicates no known allergies.  Family History  Problem Relation Age of Onset  . Hyperlipidemia Mother   . Varicose Veins Mother   . Deep vein thrombosis Father   . Stroke Father   . Breast cancer Paternal Aunt   . Stroke Maternal Grandmother   . Stroke Paternal Grandmother     Social History Social History  Substance Use Topics  . Smoking status: Former Smoker -- 0.50 packs/day for 2 years    Types: Cigarettes    Quit date: 11/18/1968  . Smokeless tobacco: None  . Alcohol Use: No     Comment: seldom    Review of Systems Constitutional: No fever/chills Eyes: No visual changes. ENT: No sore throat. Cardiovascular: Denies chest pain. Respiratory: Denies shortness of breath. Gastrointestinal: No abdominal pain.  No nausea, no vomiting.  No diarrhea.  No constipation. Genitourinary: Negative for dysuria. Musculoskeletal: Negative for back pain. Skin: Negative for rash. Neurological: Negative focal weakness or numbness.Does report mild headache over the left scalp where she has a "goose egg".  10-point ROS otherwise negative.  ____________________________________________   PHYSICAL EXAM:  VITAL SIGNS: ED Triage Vitals  Enc Vitals Group     BP 07/31/15 1134 159/77 mmHg     Pulse Rate 07/31/15 1134 65     Resp 07/31/15  1134 18     Temp 07/31/15 1134 98.1 F (36.7 C)     Temp Source 07/31/15 1134 Oral     SpO2 07/31/15 1134 95 %     Weight 07/31/15 1134 266 lb (120.657 kg)     Height 07/31/15 1134 5\' 6"  (1.676 m)     Head Cir --      Peak Flow --      Pain Score 07/31/15 1143 3     Pain  Loc --      Pain Edu? --      Excl. in GC? --    Constitutional: Alert and oriented. Well appearing and in no acute distress. Eyes: Conjunctivae are normal. PERRL. EOMI. Head: Atraumatic. Nose: No congestion/rhinnorhea. Mouth/Throat: Mucous membranes are moist.  Oropharynx non-erythematous. Neck: No stridor.  No midline cervical tenderness. Cardiovascular: Normal rate, regular rhythm. Grossly normal heart sounds.  Good peripheral circulation. Respiratory: Normal respiratory effort.  No retractions. Lungs CTAB. Gastrointestinal: Soft and nontender. No distention. A small old bruises noted over the right flank about the size of the palm, patient reports that this is from a fall she had several days ago and she is not having any concerns or pain there now except for some mild chronic backache, which she reports is fairly chronic and unchanged.  Musculoskeletal: No lower extremity tenderness nor edema.  No joint effusions. She ranges her right lower leg quite well without pain or discomfort. She does have some mild weakness in the left leg which is chronic.  Lower Extremities  No edema. Normal DP/PT pulses bilateral with good cap refill.  Normal neuro-motor function lower extremities bilateral.  RIGHT Right lower extremity demonstrates normal strength, good use of all muscles. No edema bruising or contusions of the right hip, right knee, right ankle. Full range of motion of the right lower extremity without pain. No pain on axial loading. No evidence of trauma.  LEFT No edema bruising or contusions of the hip,  knee, ankle. Patient does have a brace on the left ankle which is chronic. Full range of motion of the left lower extremity without pain. No pain on axial loading. No evidence of trauma.   The right arm demonstrates full range of motion, no deformity or injury. 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 No evidence of  injury.  Neurologic:  Normal speech and language. No gross focal neurologic deficits are appreciated. No gait instability. Skin:  Skin is warm, dry and intact. No rash noted. Psychiatric: Mood and affect are normal. Speech and behavior are normal.  ____________________________________________   LABS (all labs ordered are listed, but only abnormal results are displayed)  Labs Reviewed  GLUCOSE, CAPILLARY - Abnormal; Notable for the following:    Glucose-Capillary 179 (*)    All other components within normal limits  CBG MONITORING, ED   ____________________________________________  EKG  Reviewed and interpreted by me at 1145 Heart rate 105 QRS 1:30 QTc 460 Demonstrates a right bundle-branch block, mild intraventricular conduction delay. No evidence of acute ST abnormality or ischemic change. ____________________________________________  RADIOLOGY  DG Knee Complete 4 Views Right (Final result) Result time: 07/31/15 13:59:06   Final result by Rad Results In Interface (07/31/15 13:59:06)   Narrative:   CLINICAL DATA: Several recent falls  EXAM: RIGHT KNEE - COMPLETE 4+ VIEW  COMPARISON: None.  FINDINGS: Frontal, lateral, and bilateral oblique views were obtained. There is no acute fracture or dislocation. There is  a small joint effusion with calcification in the suprapatellar bursa. There is generalized osteoarthritic change with narrowing most marked in the patellofemoral joint region. There is spurring in all compartments. No erosive change.  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 Small joint effusion with calcification in the suprapatellar bursa. No acute fracture or dislocation. Spurring is most marked medially.   Electronically Signed By: Bretta Bang III M.D. On: 07/31/2015 13:59          CT Head Wo Contrast (Final result) Result time: 07/31/15 12:47:50   Final result by Rad Results In  Interface (07/31/15 12:47:50)   Narrative:   CLINICAL DATA: Patient fell. Head pain. Denies neck pain.  EXAM: CT HEAD WITHOUT CONTRAST  CT CERVICAL SPINE WITHOUT CONTRAST  TECHNIQUE: Multidetector CT imaging of the head and cervical spine was performed following the standard protocol without intravenous contrast. Multiplanar CT image reconstructions of the cervical spine were also generated.  COMPARISON: CT head and orbits 03/15/2015. MR head 10/18/2006.  FINDINGS: CT HEAD FINDINGS  No evidence for acute infarction, hemorrhage, mass lesion, hydrocephalus, or extra-axial fluid. Global atrophy. Chronic microvascular ischemic change. Large LEFT frontal scalp hematoma. Hyperostosis. No skull fracture. There is evidence for prior blowout injury LEFT orbit inferiorly. Similar appearance to priors.  CT CERVICAL SPINE FINDINGS  There is no visible cervical spine fracture, traumatic subluxation, prevertebral soft tissue swelling, or intraspinal hematoma. Multilevel spondylosis, with disc space narrowing most severe at C4-5 and C5-6. Vascular calcification. No pneumothorax or lung apex lesion. Incompletely evaluated hypoattenuating RIGHT paratracheal mass, suspect thyroid origin, greater than 3 cm in size, consider sonography for further characterization.  IMPRESSION: Large LEFT frontal scalp hematoma. No skull fracture or intracranial hemorrhage.  No cervical spine fracture or traumatic subluxation. Multilevel spondylosis.  Hypoattenuating greater than 3 cm RIGHT peritracheal mass, suspect thyroid origin. Consider sonography for further evaluation.   Electronically Signed By: Elsie Stain M.D. On: 07/31/2015 12:47          CT Cervical Spine Wo Contrast (Final result) Result time: 07/31/15 12:47:50   Final result by Rad Results In Interface (07/31/15 12:47:50)   Narrative:   CLINICAL DATA: Patient fell. Head pain. Denies neck pain.  EXAM: CT HEAD  WITHOUT CONTRAST  CT CERVICAL SPINE WITHOUT CONTRAST  TECHNIQUE: Multidetector CT imaging of the head and cervical spine was performed following the standard protocol without intravenous contrast. Multiplanar CT image reconstructions of the cervical spine were also generated.  COMPARISON: CT head and orbits 03/15/2015. MR head 10/18/2006.  FINDINGS: CT HEAD FINDINGS  No evidence for acute infarction, hemorrhage, mass lesion, hydrocephalus, or extra-axial fluid. Global atrophy. Chronic microvascular ischemic change. Large LEFT frontal scalp hematoma. Hyperostosis. No skull fracture. There is evidence for prior blowout injury LEFT orbit inferiorly. Similar appearance to priors.  CT CERVICAL SPINE FINDINGS  There is no visible cervical spine fracture, traumatic subluxation, prevertebral soft tissue swelling, or intraspinal hematoma. Multilevel spondylosis, with disc space narrowing most severe at C4-5 and C5-6. Vascular calcification. No pneumothorax or lung apex lesion. Incompletely evaluated hypoattenuating RIGHT paratracheal mass, suspect thyroid origin, greater than 3 cm in size, consider sonography for further characterization.  IMPRESSION: Large LEFT frontal scalp hematoma. No skull fracture or intracranial hemorrhage.  No cervical spine fracture or traumatic subluxation. Multilevel spondylosis.  Hypoattenuating greater than 3 cm RIGHT peritracheal mass, suspect thyroid origin. Consider sonography for further evaluation.   Electronically Signed By: Elsie Stain M.D. On: 07/31/2015 12:47    ____________________________________________  PROCEDURES  Procedure(s) performed: None  Critical Care performed: No  ____________________________________________   INITIAL IMPRESSION / ASSESSMENT AND PLAN / ED COURSE  Pertinent labs & imaging results that were available during my care of the patient were reviewed by me and considered in my medical decision  making (see chart for details).  Patient presents after a reported mechanical fall. She does have some mechanical issues and uses a cane at baseline due to previous stroke. She denies any injury other than a hematoma over the 4 head. 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  No complaints of acute cardiac or pulmonary symptoms. Appears consistent with mechanical fall. We will obtain CT imaging as she is on Plavix to rule out intracranial trauma.  Should CT be negative, she appears stable alert and no distress plan to discharge the patient to home.    CT no acute intracranial hemorrhage or cervical injury. Discussed the patient regarding hematoma, also thyroid nodule/mass that will need follow-up. Patient is very agreeable.  She is able to ambulate though reports her right knee feels sore., No evidence of obvious deformity or injury.  Patient awake alert denies any concerns at this time. Return precautions and treatment recommendations and follow-up discussed with the patient who is agreeable with the plan.  ____________________________________________   FINAL CLINICAL IMPRESSION(S) / ED DIAGNOSES  Final diagnoses:  Traumatic hematoma of forehead, initial encounter  Thyroid mass of unclear etiology      Sharyn CreamerMark Orrie Lascano, MD 07/31/15 1432

## 2015-07-31 NOTE — ED Notes (Signed)
Patient returned from radiology

## 2015-07-31 NOTE — Discharge Instructions (Signed)
Please follow up closely with your doctor. Please notify your primary care doctor, and obtain follow-up as you have a mass noted on the thyroid that should have close care and follow-up within the next month.  Head Injury, Adult You have a head injury. Headaches and throwing up (vomiting) are common after a head injury. It should be easy to wake up from sleeping. Sometimes you must stay in the hospital. Most problems happen within the first 24 hours. Side effects may occur up to 7-10 days after the injury.  WHAT ARE THE TYPES OF HEAD INJURIES? Head injuries can be as minor as a bump. Some head injuries can be more severe. More severe head injuries include:  A jarring injury to the brain (concussion).  A bruise of the brain (contusion). This mean there is bleeding in the brain that can cause swelling.  A cracked skull (skull fracture).  Bleeding in the brain that collects, clots, and forms a bump (hematoma). WHEN SHOULD I GET HELP RIGHT AWAY?   You are confused or sleepy.  You cannot be woken up.  You feel sick to your stomach (nauseous) or keep throwing up (vomiting).  Your dizziness or unsteadiness is getting worse.  You have very bad, lasting headaches that are not helped by medicine. Take medicines only as told by your doctor.  You cannot use your arms or legs like normal.  You cannot walk.  You notice changes in the black spots in the center of the colored part of your eye (pupil).  You have clear or bloody fluid coming from your nose or ears.  You have trouble seeing. During the next 24 hours after the injury, you must stay with someone who can watch you. This person should get help right away (call 911 in the [place] if you start to shake and are not able to control it (have seizures), you pass out, or you are unable to wake up. HOW CAN I PREVENT A HEAD INJURY IN THE FUTURE?  Wear seat belts.  Wear a helmet while bike riding and playing sports like football.  Stay away  from dangerous activities around the house. WHEN CAN I RETURN TO NORMAL ACTIVITIES AND ATHLETICS? See your doctor before doing these activities. You should not do normal activities or play contact sports until 1 week after the following symptoms have stopped:  Headache that does not go away.  Dizziness.  Poor attention.  Confusion.  Memory problems.  Sickness to your stomach or throwing up.  Tiredness.  Fussiness.  Bothered by bright lights or loud noises.  Anxiousness or depression.  Restless sleep. MAKE SURE YOU:   Understand these instructions.  Will watch your condition.  Will get help right away if you are not doing well or get worse.   This information is not intended to replace advice given to you by your health care provider. Make sure you discuss any questions you have with your health care provider.   Document Released: 02/27/2008 Document Revised: 04/06/2014 Document Reviewed: 11/21/2012 Elsevier Interactive Patient Education 2016 Elsevier Inc.   Facial or Scalp Contusion A facial or scalp contusion is a deep bruise on the face or head. Injuries to the face and head generally cause a lot of swelling, especially around the eyes. Contusions are the result of an injury that caused bleeding under the skin. The contusion may turn blue, purple, or yellow. Minor injuries will give you a painless contusion, but more severe contusions may stay painful and swollen for a  few weeks.  CAUSES  A facial or scalp contusion is caused by a blunt injury or trauma to the face or head area.  SIGNS AND SYMPTOMS   Swelling of the injured area.   Discoloration of the injured area.   Tenderness, soreness, or pain in the injured area.  DIAGNOSIS  The diagnosis can be made by taking a medical history and doing a physical exam. An X-ray exam, CT scan, or MRI may be needed to determine if there are any associated injuries, such as broken bones (fractures). TREATMENT  Often, the  best treatment for a facial or scalp contusion is applying cold compresses to the injured area. Over-the-counter medicines may also be recommended for pain control.  HOME CARE INSTRUCTIONS   Only take over-the-counter or prescription medicines as directed by your health care provider.   Apply ice to the injured area.   Put ice in a plastic bag.   Place a towel between your skin and the bag.   Leave the ice on for 20 minutes, 2-3 times a day.  SEEK MEDICAL CARE IF:  You have bite problems.   You have pain with chewing.   You are concerned about facial defects. SEEK IMMEDIATE MEDICAL CARE IF:  You have severe pain or a headache that is not relieved by medicine.   You have unusual sleepiness, confusion, or personality changes.   You throw up (vomit).   You have a persistent nosebleed.   You have double vision or blurred vision.   You have fluid drainage from your nose or ear.   You have difficulty walking or using your arms or legs.  MAKE SURE YOU:   Understand these instructions.  Will watch your condition.  Will get help right away if you are not doing well or get worse.   This information is not intended to replace advice given to you by your health care provider. Make sure you discuss any questions you have with your health care provider.   Document Released: 04/23/2004 Document Revised: 04/06/2014 Document Reviewed: 10/27/2012 Elsevier Interactive Patient Education [employer]

## 2015-07-31 NOTE — ED Notes (Signed)
Patient transported to CT 

## 2015-07-31 NOTE — ED Notes (Signed)
Pt to ED via EMS from home c/o fall today.  Per EMS pt walking with cane around exercise equipment and right foot tripped on leg of equipment, states happens often.  Pt fell and hit head, denies LOC, presents with hematoma to left front head.  Pt denies neck or back pain.  Hx of stroke with weakness to left side, currently takes plavix.  Pt presents A&Ox4, speaking in complete and coherent sentences and in NAD at this time.

## 2015-08-13 ENCOUNTER — Telehealth: Payer: Self-pay | Admitting: *Deleted

## 2015-08-13 NOTE — Telephone Encounter (Signed)
"  I'm just leaving a message in hopes to reach someone in that office. I can never get through to anyone when I call"

## 2015-08-13 NOTE — Telephone Encounter (Signed)
Pt left message to leave a message.  I called pt's contact number and the mailbox was full.

## 2015-08-14 ENCOUNTER — Other Ambulatory Visit: Payer: Self-pay | Admitting: Obstetrics and Gynecology

## 2015-08-14 DIAGNOSIS — E079 Disorder of thyroid, unspecified: Secondary | ICD-10-CM

## 2015-08-20 ENCOUNTER — Ambulatory Visit (INDEPENDENT_AMBULATORY_CARE_PROVIDER_SITE_OTHER): Payer: Medicare HMO | Admitting: Podiatry

## 2015-08-20 ENCOUNTER — Encounter: Payer: Self-pay | Admitting: Podiatry

## 2015-08-20 DIAGNOSIS — M79674 Pain in right toe(s): Secondary | ICD-10-CM

## 2015-08-20 DIAGNOSIS — E1149 Type 2 diabetes mellitus with other diabetic neurological complication: Secondary | ICD-10-CM | POA: Diagnosis not present

## 2015-08-20 DIAGNOSIS — B351 Tinea unguium: Secondary | ICD-10-CM | POA: Diagnosis not present

## 2015-08-20 DIAGNOSIS — M79675 Pain in left toe(s): Secondary | ICD-10-CM | POA: Diagnosis not present

## 2015-08-21 ENCOUNTER — Ambulatory Visit
Admission: RE | Admit: 2015-08-21 | Discharge: 2015-08-21 | Disposition: A | Discharge status: Home / Self Care | Payer: Medicare HMO | Source: Ambulatory Visit | Attending: Obstetrics and Gynecology | Admitting: Obstetrics and Gynecology

## 2015-08-21 DIAGNOSIS — E079 Disorder of thyroid, unspecified: Secondary | ICD-10-CM

## 2015-08-22 NOTE — Progress Notes (Signed)
Patient ID: Molly [name], female   DOB: [birth year], 70 y.o.   MRN: [id number]  Subjective: 70 y.o. returns the office today for painful, elongated, thickened toenails which she cannot trim herself. Denies any redness or drainage around the nails.  She states that the surgical site the left foot is doing well however she is a scab overlying the incision. Denies any redness or drainage or any swelling to the foot. She does continue with the brace , shoes. Denies any acute changes since last appointment and no new complaints today. Denies any systemic complaints such as fevers, chills, nausea, vomiting.   Objective: AAO 3, NAD DP/PT pulses palpable, CRT less than 3 seconds Protective sensation decreased with Simms Weinstein monofilament Nails hypertrophic, dystrophic, elongated, brittle, discolored 8. There is incurvation of both the medial and lateral nail borders bilateral hallux. There is tenderness overlying the nails 1-5 bilaterally except left 4/5th with have been amputated. There is no surrounding erythema or drainage along the nail sites.  Scab is present upon the incision on the left foot and the prior surgery. Upon reaming no underlying ulceration the incision appears to be healed. No drainage or pus. No swelling, erythema, increase in warmth. No malodor. No other areas of tenderness bilateral lower extremities. No overlying edema, erythema, increased warmth. No pain with calf compression, swelling, warmth, erythema.  Assessment: Patient presents with symptomatic onychomycosis  Plan: -Treatment options including alternatives, risks, complications were discussed -Nails sharply debrided 8 without complication/bleeding. - hyperkeratotic lesion on the incision braided without complications. Incision appears to be healed. No signs of infection. Continue to monitor. -Discussed daily foot inspection. If there are any changes, to call the office immediately.  -Follow-up in 3 months or  sooner if any problems are to arise. In the meantime, encouraged to call the office with any questions, concerns, changes symptoms.  Ovid CurdMatthew Wagoner, DPM

## 2015-09-04 ENCOUNTER — Ambulatory Visit (INDEPENDENT_AMBULATORY_CARE_PROVIDER_SITE_OTHER): Payer: Medicare HMO | Admitting: *Deleted

## 2015-09-04 DIAGNOSIS — M2040 Other hammer toe(s) (acquired), unspecified foot: Secondary | ICD-10-CM

## 2015-09-04 DIAGNOSIS — E1149 Type 2 diabetes mellitus with other diabetic neurological complication: Secondary | ICD-10-CM

## 2015-09-04 NOTE — Progress Notes (Signed)
Patient presents to pick up diabetic shoes and insoles. Fit was NOT satisfactory today and will send shoes back and reorder 1/2 size larger.   Return-[employer] [id number] size 9 XW   Reorder-Apex [id number] size 9.5 XW  Will notify pt once reorder arrives. Inserts were kept in the office.

## 2015-09-20 ENCOUNTER — Ambulatory Visit (INDEPENDENT_AMBULATORY_CARE_PROVIDER_SITE_OTHER): Payer: Medicare HMO | Admitting: Sports Medicine

## 2015-09-20 DIAGNOSIS — Z899 Acquired absence of limb, unspecified: Secondary | ICD-10-CM

## 2015-09-20 DIAGNOSIS — M2040 Other hammer toe(s) (acquired), unspecified foot: Secondary | ICD-10-CM

## 2015-09-20 DIAGNOSIS — M79673 Pain in unspecified foot: Secondary | ICD-10-CM

## 2015-09-20 DIAGNOSIS — E1149 Type 2 diabetes mellitus with other diabetic neurological complication: Secondary | ICD-10-CM

## 2015-09-20 NOTE — Progress Notes (Signed)
Patient ID: Molly [name], female   DOB: 1945-06-06, 70 y.o.   MRN: [id number] Patient came in to pick up extra-depth shoes, wearing her brace. Patient tried on shoes and shoes could not fit with her current brace shoes were repackaged to be sent back to vendor patient to reschedule an appointment for reassessment for a new set of shoes that will work with her brace -Dr. Marylene LandStover

## 2015-09-26 ENCOUNTER — Encounter: Payer: Self-pay | Admitting: Podiatry

## 2015-09-26 ENCOUNTER — Ambulatory Visit: Payer: Medicare HMO | Admitting: Podiatry

## 2015-10-02 ENCOUNTER — Ambulatory Visit: Payer: Medicare HMO

## 2015-10-22 ENCOUNTER — Ambulatory Visit: Payer: Medicare HMO | Admitting: Podiatry

## 2015-10-30 ENCOUNTER — Ambulatory Visit: Payer: Medicare HMO

## 2015-11-12 ENCOUNTER — Ambulatory Visit: Payer: Medicare HMO | Admitting: Podiatry

## 2015-12-04 ENCOUNTER — Ambulatory Visit (INDEPENDENT_AMBULATORY_CARE_PROVIDER_SITE_OTHER): Payer: Medicare HMO | Admitting: Sports Medicine

## 2015-12-04 DIAGNOSIS — M2041 Other hammer toe(s) (acquired), right foot: Secondary | ICD-10-CM

## 2015-12-04 DIAGNOSIS — E1149 Type 2 diabetes mellitus with other diabetic neurological complication: Secondary | ICD-10-CM

## 2015-12-04 DIAGNOSIS — M2042 Other hammer toe(s) (acquired), left foot: Secondary | ICD-10-CM

## 2015-12-04 DIAGNOSIS — Z899 Acquired absence of limb, unspecified: Secondary | ICD-10-CM

## 2015-12-04 DIAGNOSIS — M2040 Other hammer toe(s) (acquired), unspecified foot: Secondary | ICD-10-CM

## 2015-12-04 DIAGNOSIS — M79673 Pain in unspecified foot: Secondary | ICD-10-CM

## 2015-12-10 NOTE — Progress Notes (Signed)
Patient presents for pick up diabetic shoes and insoles, however the documentation from PCP has expired and will resubmit paperwork and contact the patient once received.   Molly [name] did check the fit of the shoes with the patient today and fit was satisfactory.

## 2015-12-11 NOTE — Progress Notes (Signed)
Patient discussed with medical assistant. Agree with below. Patient to follow up as scheduled for continued care or sooner if problems or issues arise. -Dr. Inman Fettig  

## 2016-01-09 ENCOUNTER — Ambulatory Visit (INDEPENDENT_AMBULATORY_CARE_PROVIDER_SITE_OTHER): Payer: Medicare HMO | Admitting: Podiatry

## 2016-01-09 ENCOUNTER — Encounter: Payer: Self-pay | Admitting: Podiatry

## 2016-01-09 DIAGNOSIS — E1149 Type 2 diabetes mellitus with other diabetic neurological complication: Secondary | ICD-10-CM | POA: Diagnosis not present

## 2016-01-09 DIAGNOSIS — W19XXXA Unspecified fall, initial encounter: Secondary | ICD-10-CM

## 2016-01-09 DIAGNOSIS — M79675 Pain in left toe(s): Secondary | ICD-10-CM | POA: Diagnosis not present

## 2016-01-09 DIAGNOSIS — M79674 Pain in right toe(s): Secondary | ICD-10-CM

## 2016-01-09 DIAGNOSIS — L97521 Non-pressure chronic ulcer of other part of left foot limited to breakdown of skin: Secondary | ICD-10-CM

## 2016-01-09 DIAGNOSIS — B351 Tinea unguium: Secondary | ICD-10-CM

## 2016-01-09 MED ORDER — CEPHALEXIN 500 MG PO CAPS: 500.0000 mg | ORAL_CAPSULE | Freq: Three times a day (TID) | ORAL | 2 refills | Status: DC

## 2016-01-09 NOTE — Progress Notes (Signed)
Patient ID: Ardis HughsBrenda P Novinger, female   DOB: Aug 13, 1945, 70 y.o.   MRN: [id number]  Subjective: 70 y.o. returns the office today for painful, elongated, thickened toenails which she cannot trim herself. Denies any redness or drainage coming from the toenail sites. The nails are painful with shoe gear and pressure. She also states that since last appointment she has had several falls at home and she's had multiple EMS visits to her house to falls. They stated discussed this with her primary care physician as well. She is not had any physical therapy recently at home. Her husband also has noticed a small spot on the side of the left foot small amount of redness over the site of the previous amputation is. Denies any drainage or pus or any red streaks.  Denies any acute changes since last appointment and no new complaints today. Denies any systemic complaints such as fevers, chills, nausea, vomiting.   Objective: AAO 3, NAD DP/PT pulses palpable, CRT less than 3 seconds Protective sensation decreased with Simms Weinstein monofilament Nails hypertrophic, dystrophic, elongated, brittle, discolored 8. There is incurvation of both the medial and lateral nail borders bilateral hallux. There is tenderness overlying the nails 1-5 bilaterally except left 4/5th with have been amputated. There is no surrounding erythema or drainage along the nail sites.  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 Along last of the foot is a small superficial abrasion type lesion with a small amount of redness and warmth to the area. There is no ascending cellulitis there is no fluctuance or crepitus there is no malodor. No drainage or pus is expressed today. Small flaccid blister present on the plantar aspect. No drainage or pus expressed today. Also pre-ulcer lesions dorsal aspect of the right foot. No open sores identified this is pre-ulcerative. No ascending synovitis. No fluctuance or crepitus. Varus deformity  on the left foot- wears AFO No other areas of tenderness bilateral lower extremities. No overlying edema, erythema, increased warmth. No pain with calf compression, swelling, warmth, erythema.  Assessment: Patient presents with symptomatic onychomycosis; wound left foot  Plan: -Treatment options including alternatives, risks, complications were discussed -Nails sharply debrided 8 without complication/bleeding. -Recommend antibiotic ointment and a bandage the left foot daily. Also prescribed Keflex due to mild warmth to the area. Monitor right-sided symptoms of infection of the ER should any occur. -Recommended home physical therapy. This was ordered. Follow-up with PCP as well.  -Discussed daily foot inspection. If there are any changes, to call the office immediately.  -Follow-up in 2 weeks or sooner if any problems are to arise. In the meantime, encouraged to call the office with any questions, concerns, changes symptoms.  Ovid CurdMatthew Travis Mastel, DPM

## 2016-01-10 ENCOUNTER — Telehealth: Payer: Self-pay | Admitting: *Deleted

## 2016-01-10 DIAGNOSIS — W19XXXA Unspecified fall, initial encounter: Secondary | ICD-10-CM

## 2016-01-10 NOTE — Telephone Encounter (Addendum)
-----   Message from Vivi BarrackMatthew R Wagoner, DPM sent at 01/09/2016 12:35 PM EDT ----- Can you order home PT to help with gait and she has been having frequent falls at home. Faxed referral, LOV notes, and demographics to [facility].

## 2016-01-14 ENCOUNTER — Emergency Department: Payer: Medicare HMO

## 2016-01-14 ENCOUNTER — Observation Stay: Payer: Medicare HMO

## 2016-01-14 ENCOUNTER — Observation Stay
Admission: EM | Admit: 2016-01-14 | Discharge: 2016-01-16 | Disposition: A | Discharge status: Skilled Nursing Facility | Payer: Medicare HMO | Attending: Internal Medicine | Admitting: Internal Medicine

## 2016-01-14 DIAGNOSIS — Z87891 Personal history of nicotine dependence: Secondary | ICD-10-CM | POA: Insufficient documentation

## 2016-01-14 DIAGNOSIS — L97429 Non-pressure chronic ulcer of left heel and midfoot with unspecified severity: Secondary | ICD-10-CM | POA: Insufficient documentation

## 2016-01-14 DIAGNOSIS — I739 Peripheral vascular disease, unspecified: Secondary | ICD-10-CM | POA: Insufficient documentation

## 2016-01-14 DIAGNOSIS — E11621 Type 2 diabetes mellitus with foot ulcer: Secondary | ICD-10-CM | POA: Insufficient documentation

## 2016-01-14 DIAGNOSIS — Z6841 Body Mass Index (BMI) 40.0 and over, adult: Secondary | ICD-10-CM | POA: Insufficient documentation

## 2016-01-14 DIAGNOSIS — M6281 Muscle weakness (generalized): Secondary | ICD-10-CM

## 2016-01-14 DIAGNOSIS — L03116 Cellulitis of left lower limb: Secondary | ICD-10-CM | POA: Diagnosis not present

## 2016-01-14 DIAGNOSIS — R531 Weakness: Secondary | ICD-10-CM | POA: Diagnosis present

## 2016-01-14 DIAGNOSIS — H0520 Unspecified exophthalmos: Secondary | ICD-10-CM | POA: Insufficient documentation

## 2016-01-14 DIAGNOSIS — D649 Anemia, unspecified: Secondary | ICD-10-CM | POA: Diagnosis not present

## 2016-01-14 DIAGNOSIS — F419 Anxiety disorder, unspecified: Secondary | ICD-10-CM | POA: Insufficient documentation

## 2016-01-14 DIAGNOSIS — Z89422 Acquired absence of other left toe(s): Secondary | ICD-10-CM | POA: Diagnosis not present

## 2016-01-14 DIAGNOSIS — I350 Nonrheumatic aortic (valve) stenosis: Secondary | ICD-10-CM | POA: Diagnosis not present

## 2016-01-14 DIAGNOSIS — J449 Chronic obstructive pulmonary disease, unspecified: Secondary | ICD-10-CM | POA: Diagnosis not present

## 2016-01-14 DIAGNOSIS — I10 Essential (primary) hypertension: Secondary | ICD-10-CM | POA: Diagnosis not present

## 2016-01-14 DIAGNOSIS — Z9849 Cataract extraction status, unspecified eye: Secondary | ICD-10-CM | POA: Diagnosis not present

## 2016-01-14 DIAGNOSIS — M8580 Other specified disorders of bone density and structure, unspecified site: Secondary | ICD-10-CM | POA: Insufficient documentation

## 2016-01-14 DIAGNOSIS — E785 Hyperlipidemia, unspecified: Secondary | ICD-10-CM | POA: Diagnosis not present

## 2016-01-14 DIAGNOSIS — Z7902 Long term (current) use of antithrombotics/antiplatelets: Secondary | ICD-10-CM | POA: Insufficient documentation

## 2016-01-14 DIAGNOSIS — M170 Bilateral primary osteoarthritis of knee: Secondary | ICD-10-CM | POA: Insufficient documentation

## 2016-01-14 DIAGNOSIS — I69354 Hemiplegia and hemiparesis following cerebral infarction affecting left non-dominant side: Secondary | ICD-10-CM | POA: Diagnosis not present

## 2016-01-14 DIAGNOSIS — Z7982 Long term (current) use of aspirin: Secondary | ICD-10-CM | POA: Insufficient documentation

## 2016-01-14 DIAGNOSIS — R42 Dizziness and giddiness: Secondary | ICD-10-CM | POA: Diagnosis not present

## 2016-01-14 DIAGNOSIS — I4510 Unspecified right bundle-branch block: Secondary | ICD-10-CM | POA: Insufficient documentation

## 2016-01-14 DIAGNOSIS — F329 Major depressive disorder, single episode, unspecified: Secondary | ICD-10-CM | POA: Diagnosis not present

## 2016-01-14 DIAGNOSIS — Z823 Family history of stroke: Secondary | ICD-10-CM | POA: Insufficient documentation

## 2016-01-14 DIAGNOSIS — Z79899 Other long term (current) drug therapy: Secondary | ICD-10-CM | POA: Insufficient documentation

## 2016-01-14 DIAGNOSIS — L8990 Pressure ulcer of unspecified site, unspecified stage: Secondary | ICD-10-CM | POA: Insufficient documentation

## 2016-01-14 DIAGNOSIS — M869 Osteomyelitis, unspecified: Secondary | ICD-10-CM | POA: Diagnosis not present

## 2016-01-14 DIAGNOSIS — W19XXXA Unspecified fall, initial encounter: Secondary | ICD-10-CM

## 2016-01-14 DIAGNOSIS — R262 Difficulty in walking, not elsewhere classified: Secondary | ICD-10-CM

## 2016-01-14 DIAGNOSIS — R296 Repeated falls: Secondary | ICD-10-CM | POA: Insufficient documentation

## 2016-01-14 DIAGNOSIS — Z794 Long term (current) use of insulin: Secondary | ICD-10-CM | POA: Insufficient documentation

## 2016-01-14 DIAGNOSIS — Z803 Family history of malignant neoplasm of breast: Secondary | ICD-10-CM | POA: Insufficient documentation

## 2016-01-14 DIAGNOSIS — Z8249 Family history of ischemic heart disease and other diseases of the circulatory system: Secondary | ICD-10-CM | POA: Insufficient documentation

## 2016-01-14 LAB — BLOOD GAS, VENOUS
Patient temperature: 37
pCO2, Ven: 63 mmHg — ABNORMAL HIGH (ref 44.0–60.0)
pH, Ven: 7.36 (ref 7.250–7.430)
pO2, Ven: <31 mmHg — CL (ref 32.0–45.0)

## 2016-01-14 LAB — URINALYSIS COMPLETE WITH MICROSCOPIC (ARMC ONLY)
BILIRUBIN URINE: NEGATIVE
Bacteria, UA: NONE SEEN
GLUCOSE, UA: NEGATIVE mg/dL
HGB URINE DIPSTICK: NEGATIVE
KETONES UR: NEGATIVE mg/dL
LEUKOCYTES UA: NEGATIVE
NITRITE: NEGATIVE
PH: 6 (ref 5.0–8.0)
Protein, ur: 30 mg/dL — AB
Specific Gravity, Urine: 1.006 (ref 1.005–1.030)
WBC, UA: NONE SEEN WBC/hpf (ref 0–5)

## 2016-01-14 LAB — COMPREHENSIVE METABOLIC PANEL
ALT: 40 U/L (ref 14–54)
ANION GAP: 9 (ref 5–15)
AST: 25 U/L (ref 15–41)
Albumin: 3.6 g/dL (ref 3.5–5.0)
Alkaline Phosphatase: 71 U/L (ref 38–126)
BUN: 21 mg/dL — AB (ref 6–20)
CHLORIDE: 98 mmol/L — AB (ref 101–111)
CO2: 30 mmol/L (ref 22–32)
Calcium: 9.5 mg/dL (ref 8.9–10.3)
Creatinine, Ser: 0.97 mg/dL (ref 0.44–1.00)
GFR calc Af Amer: >60 mL/min (ref >60)
GFR, EST NON AFRICAN AMERICAN: 58 mL/min — AB (ref >60)
Glucose, Bld: 139 mg/dL — ABNORMAL HIGH (ref 65–99)
POTASSIUM: 4.6 mmol/L (ref 3.5–5.1)
Sodium: 137 mmol/L (ref 135–145)
Total Bilirubin: 0.6 mg/dL (ref 0.3–1.2)
Total Protein: 7.2 g/dL (ref 6.5–8.1)

## 2016-01-14 LAB — CBC WITH DIFFERENTIAL/PLATELET
BASOS ABS: 0 10*3/uL (ref 0–0.1)
Basophils Relative: 0 %
EOS PCT: 3 %
Eosinophils Absolute: 0.4 10*3/uL (ref 0–0.7)
HCT: 37.9 % (ref 35.0–47.0)
Hemoglobin: 12.7 g/dL (ref 12.0–16.0)
LYMPHS PCT: 14 %
Lymphs Abs: 1.6 10*3/uL (ref 1.0–3.6)
MCH: 31.7 pg (ref 26.0–34.0)
MCHC: 33.4 g/dL (ref 32.0–36.0)
MCV: 95 fL (ref 80.0–100.0)
MONO ABS: 0.9 10*3/uL (ref 0.2–0.9)
Monocytes Relative: 8 %
Neutro Abs: 8.3 10*3/uL — ABNORMAL HIGH (ref 1.4–6.5)
Neutrophils Relative %: 75 %
PLATELETS: 287 10*3/uL (ref 150–440)
RBC: 3.99 MIL/uL (ref 3.80–5.20)
RDW: 14.9 % — AB (ref 11.5–14.5)
WBC: 11.3 10*3/uL — ABNORMAL HIGH (ref 3.6–11.0)

## 2016-01-14 LAB — GLUCOSE, CAPILLARY
Glucose-Capillary: 138 mg/dL — ABNORMAL HIGH (ref 65–99)
Glucose-Capillary: 221 mg/dL — ABNORMAL HIGH (ref 65–99)

## 2016-01-14 LAB — TROPONIN I

## 2016-01-14 MED ORDER — MECLIZINE HCL 25 MG PO TABS
25.0000 mg | ORAL_TABLET | Freq: Three times a day (TID) | ORAL | Status: DC
  Administered 2016-01-14 – 2016-01-16 (×7): 25 mg via ORAL
  Filled 2016-01-14 (×7): qty 1

## 2016-01-14 MED ORDER — LOSARTAN POTASSIUM 50 MG PO TABS
50.0000 mg | ORAL_TABLET | Freq: Every day | ORAL | Status: DC
  Administered 2016-01-14 – 2016-01-16 (×3): 50 mg via ORAL
  Filled 2016-01-14 (×3): qty 1

## 2016-01-14 MED ORDER — DOCUSATE SODIUM 100 MG PO CAPS
100.0000 mg | ORAL_CAPSULE | Freq: Two times a day (BID) | ORAL | Status: DC
  Administered 2016-01-14 – 2016-01-16 (×4): 100 mg via ORAL
  Filled 2016-01-14 (×4): qty 1

## 2016-01-14 MED ORDER — ACETAMINOPHEN 325 MG PO TABS: 650.0000 mg | ORAL_TABLET | Freq: Four times a day (QID) | ORAL | Status: DC | PRN

## 2016-01-14 MED ORDER — ONDANSETRON HCL 4 MG PO TABS: 4.0000 mg | ORAL_TABLET | Freq: Four times a day (QID) | ORAL | Status: DC | PRN

## 2016-01-14 MED ORDER — VITAMIN C 500 MG PO TABS
1000.0000 mg | ORAL_TABLET | Freq: Every day | ORAL | Status: DC
  Administered 2016-01-14 – 2016-01-16 (×3): 1000 mg via ORAL
  Filled 2016-01-14 (×3): qty 2

## 2016-01-14 MED ORDER — ACETAMINOPHEN 650 MG RE SUPP: 650.0000 mg | Freq: Four times a day (QID) | RECTAL | Status: DC | PRN

## 2016-01-14 MED ORDER — SODIUM CHLORIDE 0.9 % IV SOLN
Freq: Once | INTRAVENOUS | Status: AC
  Administered 2016-01-14: 12:00:00 via INTRAVENOUS

## 2016-01-14 MED ORDER — TRAZODONE HCL 50 MG PO TABS: 25.0000 mg | ORAL_TABLET | Freq: Every evening | ORAL | Status: DC | PRN

## 2016-01-14 MED ORDER — CLOPIDOGREL BISULFATE 75 MG PO TABS
75.0000 mg | ORAL_TABLET | Freq: Every day | ORAL | Status: DC
  Administered 2016-01-14 – 2016-01-16 (×3): 75 mg via ORAL
  Filled 2016-01-14 (×3): qty 1

## 2016-01-14 MED ORDER — AMITRIPTYLINE HCL 25 MG PO TABS
50.0000 mg | ORAL_TABLET | Freq: Every day | ORAL | Status: DC
  Administered 2016-01-14 – 2016-01-16 (×2): 50 mg via ORAL
  Filled 2016-01-14: qty 5
  Filled 2016-01-14: qty 2
  Filled 2016-01-14 (×2): qty 5

## 2016-01-14 MED ORDER — CALCIUM CARBONATE ANTACID 500 MG PO CHEW
500.0000 mg | CHEWABLE_TABLET | Freq: Every day | ORAL | Status: DC
  Administered 2016-01-14 – 2016-01-16 (×3): 500 mg via ORAL
  Filled 2016-01-14 (×4): qty 1

## 2016-01-14 MED ORDER — SODIUM CHLORIDE 0.9 % IV SOLN
INTRAVENOUS | Status: DC
  Administered 2016-01-14: 17:00:00 via INTRAVENOUS

## 2016-01-14 MED ORDER — MAGNESIUM OXIDE 400 (241.3 MG) MG PO TABS
400.0000 mg | ORAL_TABLET | Freq: Two times a day (BID) | ORAL | Status: DC
  Administered 2016-01-14 – 2016-01-16 (×4): 400 mg via ORAL
  Filled 2016-01-14 (×4): qty 1

## 2016-01-14 MED ORDER — BISACODYL 5 MG PO TBEC: 5.0000 mg | DELAYED_RELEASE_TABLET | Freq: Every day | ORAL | Status: DC | PRN

## 2016-01-14 MED ORDER — MELATONIN 5 MG PO TABS
10.0000 mg | ORAL_TABLET | Freq: Every day | ORAL | Status: DC
Start: 2016-01-14 — End: 2016-01-16
  Administered 2016-01-14 – 2016-01-15 (×2): 10 mg via ORAL
  Filled 2016-01-14 (×3): qty 2

## 2016-01-14 MED ORDER — ENOXAPARIN SODIUM 40 MG/0.4ML ~~LOC~~ SOLN
40.0000 mg | Freq: Two times a day (BID) | SUBCUTANEOUS | Status: DC
  Administered 2016-01-14 – 2016-01-16 (×4): 40 mg via SUBCUTANEOUS
  Filled 2016-01-14 (×4): qty 0.4

## 2016-01-14 MED ORDER — ONDANSETRON HCL 4 MG/2ML IJ SOLN
4.0000 mg | Freq: Four times a day (QID) | INTRAMUSCULAR | Status: DC | PRN
  Administered 2016-01-14 – 2016-01-15 (×2): 4 mg via INTRAVENOUS
  Filled 2016-01-14 (×3): qty 2

## 2016-01-14 MED ORDER — ASPIRIN EC 81 MG PO TBEC
81.0000 mg | DELAYED_RELEASE_TABLET | Freq: Every day | ORAL | Status: DC
  Administered 2016-01-14 – 2016-01-16 (×3): 81 mg via ORAL
  Filled 2016-01-14 (×3): qty 1

## 2016-01-14 MED ORDER — BUPROPION HCL ER (XL) 300 MG PO TB24
300.0000 mg | ORAL_TABLET | Freq: Every day | ORAL | Status: DC
  Administered 2016-01-14 – 2016-01-16 (×3): 300 mg via ORAL
  Filled 2016-01-14 (×5): qty 1

## 2016-01-14 MED ORDER — MECLIZINE HCL 25 MG PO TABS
50.0000 mg | ORAL_TABLET | Freq: Once | ORAL | Status: AC
  Administered 2016-01-14: 50 mg via ORAL
  Filled 2016-01-14: qty 2

## 2016-01-14 MED ORDER — METFORMIN HCL 500 MG PO TABS
500.0000 mg | ORAL_TABLET | Freq: Two times a day (BID) | ORAL | Status: DC
  Administered 2016-01-14: 500 mg via ORAL
  Filled 2016-01-14: qty 1

## 2016-01-14 NOTE — ED Notes (Signed)
Patient transported to MRI 

## 2016-01-14 NOTE — H&P (Signed)
[facility] - [place] at [facility]   PATIENT NAME: Molly [name]    MR#:  [id number]  DATE OF BIRTH:  1945-08-15  DATE OF ADMISSION:  01/14/2016  PRIMARY CARE PHYSICIAN: Elita Boone, MD   REQUESTING/REFERRING PHYSICIAN: Dr. Mayford Knife  CHIEF COMPLAINT: Dizziness    Chief Complaint  Patient presents with  . Dizziness    HISTORY OF PRESENT ILLNESS:  Molly [name]  is a 70 y.o. female with a known history of Hypertension, diabetes, previous history of stroke with left-sided weakness more so on the left arm complaints of dizziness for the past 2 days. Patient feels very dizzy even with minimal movement of head, having ambulatory difficulties because of the dizziness. Also complains of nausea associated with dizziness. No double vision or blurred vision. No headache. Patient had history of previous stroke and has been falling lately a lot. Uses cane at home at baseline. Started on Wellbutrin a month ago.  PAST MEDICAL HISTORY:   Past Medical History:  Diagnosis Date  . Anxiety   . COPD (chronic obstructive pulmonary disease) (HCC)   . Diabetes (HCC)   . Hemorrhoid   . HLD (hyperlipidemia)   . HTN (hypertension)   . Stroke [facility]) 2008   left weakness    PAST SURGICAL HISTOIRY:   Past Surgical History:  Procedure Laterality Date  . AMPUTATION Left 05/22/2014   Procedure: AMPUTATION RAY LEFT FOURTH AND FIFTH ;  Surgeon: Toni Arthurs, MD;  Location: [facility] OR;  Service: Orthopedics;  Laterality: Left;  . CATARACT EXTRACTION    . COLONOSCOPY  2004    SOCIAL HISTORY:   Social History  Substance Use Topics  . Smoking status: Former Smoker    Packs/day: 0.50    Years: 2.00    Types: Cigarettes    Quit date: 11/18/1968  . Smokeless tobacco: Never Used  . Alcohol use No     Comment: seldom    FAMILY HISTORY:   Family History  Problem Relation Age of Onset  . Hyperlipidemia Mother   . Varicose Veins Mother   . Deep vein thrombosis Father   .  Stroke Father   . Breast cancer Paternal Aunt   . Stroke Maternal Grandmother   . Stroke Paternal Grandmother     DRUG ALLERGIES:  No Known Allergies  REVIEW OF SYSTEMS:  CONSTITUTIONAL: No fever, fatigue or weakness.  EYES: No blurred or double vision.  EARS, NOSE, AND THROAT: Complains of  vertigo. No ear pains RESPIRATORY: No cough, shortness of breath, wheezing or hemoptysis.  CARDIOVASCULAR: No chest pain, orthopnea, edema.  GASTROINTESTINAL: No nausea, vomiting, diarrhea or abdominal pain.  GENITOURINARY: No dysuria, hematuria.  ENDOCRINE: No polyuria, nocturia,  HEMATOLOGY: No anemia, easy bruising or bleeding SKIN: No rash or lesion. MUSCULOSKELETAL: Bilateral leg edema present, bilateral knee pains.  No tingling, numbness, weakness.  PSYCHIATRY: No anxiety or depression.  Neurologically patient has left hand weakness secondary to previous stroke.  MEDICATIONS AT HOME:   Prior to Admission medications   Medication Sig Start Date End Date Taking? Authorizing Provider  amitriptyline (ELAVIL) 50 MG tablet Take 50 mg by mouth daily.   Yes Historical Provider, MD  Ascorbic Acid (VITAMIN C) 1000 MG tablet Take 1,000 mg by mouth daily.   Yes Historical Provider, MD  aspirin EC 81 MG tablet Take 81 mg by mouth daily.   Yes Historical Provider, MD  B Complex-C (SUPER B COMPLEX PO) Take 1 tablet by mouth daily.    Yes Historical  Provider, MD  Bilberry, Vaccinium myrtillus, (BILBERRY PO) Take 1 capsule by mouth daily.    Yes Historical Provider, MD  buPROPion (WELLBUTRIN XL) 300 MG 24 hr tablet Take 300 mg by mouth daily.   Yes Historical Provider, MD  calcium carbonate (OS-CAL) 600 MG TABS tablet Take 600 mg by mouth daily.   Yes Historical Provider, MD  Cholecalciferol (VITAMIN D3) 1000 UNITS CAPS Take 1,000 Units by mouth daily.   Yes Historical Provider, MD  clopidogrel (PLAVIX) 75 MG tablet Take 75 mg by mouth daily.   Yes Historical Provider, MD  Coenzyme Q10 (COQ10 PO) Take 1  capsule by mouth daily.    Yes Historical Provider, MD  docusate sodium (COLACE) 100 MG capsule Take 100 mg by mouth daily.   Yes Historical Provider, MD  GLUCOSAMINE-CHONDROITIN PO Take 1 tablet by mouth 2 (two) times daily.    Yes Historical Provider, MD  insulin aspart protamine- aspart (NOVOLOG MIX 70/30) (70-30) 100 UNIT/ML injection Inject 100-120 Units into the skin 2 (two) times daily.    Yes Historical Provider, MD  losartan (COZAAR) 50 MG tablet Take 50 mg by mouth daily.   Yes Historical Provider, MD  magnesium oxide (MAG-OX) 400 MG tablet Take 400 mg by mouth 2 (two) times daily.   Yes Historical Provider, MD  Melatonin 10 MG TBDP Take 10 mg by mouth at bedtime.   Yes Historical Provider, MD  metFORMIN (GLUCOPHAGE) 500 MG tablet Take 500 mg by mouth 2 (two) times daily.    Yes Historical Provider, MD  Multiple Vitamins-Minerals (OCUVITE PO) Take 1 capsule by mouth daily.    Yes Historical Provider, MD  naproxen sodium (ANAPROX) 220 MG tablet Take 220-440 mg by mouth 2 (two) times daily as needed (for pain).    Yes Historical Provider, MD  niacin 500 MG tablet Take 500 mg by mouth daily.    Yes Historical Provider, MD  Omega-3 Fatty Acids (FISH OIL) 1200 MG CAPS Take 1,200 mg by mouth daily.   Yes Historical Provider, MD  VALERIAN PO Take 1 tablet by mouth 2 (two) times daily.    Yes Historical Provider, MD  vitamin E 400 UNIT capsule Take 400 Units by mouth daily.   Yes Historical Provider, MD      VITAL SIGNS:  Blood pressure (!) 153/92, pulse 95, temperature 97.8 F (36.6 C), temperature source Oral, resp. rate 18, height 5\' 7"  (1.702 m), weight 116.6 kg (257 lb), SpO2 94 %.  PHYSICAL EXAMINATION:  GENERAL:  70 y.o.-year-old patient lying in the bed with no acute distress.  EYES: Pupils equal, round, reactive to light and accommodation. No scleral icterus. Extraocular muscles intact.  HEENT: Head atraumatic, normocephalic. Oropharynx and nasopharynx clear.  NECK:  Supple, no  jugular venous distention. No thyroid enlargement, no tenderness.  LUNGS: Normal breath sounds bilaterally, no wheezing, rales,rhonchi or crepitation. No use of accessory muscles of respiration.  CARDIOVASCULAR: S1, S2 normal. No murmurs, rubs, or gallops.  ABDOMEN: Soft, nontender, nondistended. Bowel sounds present. No organomegaly or mass.  EXTREMITIES:Bilateral pitting edema present ,no cyanosis, or clubbing.  NEUROLOGIC: Cranial nerves II through XII are intact.Strength decreased on the left upper extremity, left lower extremity due to previous stroke. Gait not checked. : The patient is alert and oriented x 3.  SKIN: No obvious rash, lesion, or ulcer.   LABORATORY PANEL:   CBC  Recent Labs Lab 01/14/16 1011  WBC 11.3*  HGB 12.7  HCT 37.9  PLT 287   ------------------------------------------------------------------------------------------------------------------  Chemistries   Recent Labs Lab 01/14/16 1011  NA 137  K 4.6  CL 98*  CO2 30  GLUCOSE 139*  BUN 21*  CREATININE 0.97  CALCIUM 9.5  AST 25  ALT 40  ALKPHOS 71  BILITOT 0.6   ------------------------------------------------------------------------------------------------------------------  Cardiac Enzymes  Recent Labs Lab 01/14/16 1011  TROPONINI <0.03   ------------------------------------------------------------------------------------------------------------------  RADIOLOGY:  Ct Head Wo Contrast  Result Date: 01/14/2016 CLINICAL DATA:  Dizziness for 1 day, recent falls EXAM: CT HEAD WITHOUT CONTRAST TECHNIQUE: Contiguous axial images were obtained from the base of the skull through the vertex without intravenous contrast. COMPARISON:  CT brain scan of 07/31/2015 FINDINGS: Brain: The ventricular system is minimally prominent, with moderately severe small vessel ischemic changes noted throughout the periventricular white matter. The septum is midline in position. The fourth ventricle and basilar  cisterns are unremarkable. No hemorrhage, mass lesion, or acute infarction is seen. Vascular: There is dense calcification of the right vertebral artery. Skull: No acute calvarial abnormality is seen. Sinuses/Orbits: The paranasal sinuses are pneumatized. Other: None IMPRESSION: Moderately severe small vessel ischemic change throughout the periventricular white matter for age. No acute intracranial abnormality. Electronically Signed   By: Dwyane DeePaul  Barry M.D.   On: 01/14/2016 10:35    EKG:   Orders placed or performed during the hospital encounter of 01/14/16  . ED EKG  . ED EKG  . EKG 12-Lead  . EKG 12-Lead   EKG shows 93 bpm, no ST T changes.  IMPRESSION AND PLAN:    1.Vertigo; likely benign positional vertigo. Admitted to hospitalist service under observation, started on meclizine. Initial head CT to unremarkable. Check MRI of the brain. Check ultrasound of carotids also. Continue meclizine. And the physical therapy consult because of history of falls and with vertigo.  #2 #2 diabetes mellitus type 2: Patient is on high-dose 70/30  insulin 120 units in the morning, 100 units at night as per patient's family. Consult diabetic team. Continue metformin meanwhile. #3 history of previous stroke: Patient is on Plavix continue that. Depression: Continue Elavil, Wellbutrin. #4.essential hypertension: Continue losartan.  D/w husband  All the records are reviewed and case discussed with ED provider. Management plans discussed with the patient, family and they are in agreement.  CODE STATUS:full  TOTAL TIME TAKING CARE OF THIS PATIENT:55 minutes.    Katha HammingKONIDENA,Jahmeir Geisen M.D on 01/14/2016 at 3:29 PM  Between 7am to 6pm - Pager - [phone]  After 6pm go to [email or web address] - password [email or web address] [facility] [place] [facility]  Office  [phone]  CC: Primary care physician; Elita Booneoberts, Caroline C, MD  Note: This dictation was prepared with Dragon dictation along with smaller phrase  technology. Any transcriptional errors that result from this process are unintentional.

## 2016-01-14 NOTE — ED Triage Notes (Signed)
Pt c/o dizziness since yesterday, EMS was called to her home 3 times yesterday for assistance up after falling. Pt has an ulcer to the left lateral foot that [facility] and told to use abx oinment..Marland Kitchen

## 2016-01-14 NOTE — Care Management Obs Status (Signed)
MEDICARE OBSERVATION STATUS NOTIFICATION   Patient Details  Name: Molly [name] MRN: [id number] Date of Birth: 1945-06-27   Medicare Observation Status Notification Given:  Yes    Berna BueCheryl Chyrel Taha, RN 01/14/2016, 2:00 PM

## 2016-01-14 NOTE — Progress Notes (Signed)
Anticoagulation monitoring(Lovenox):  70 yo female ordered Lovenox 40 mg Q24h  Filed Weights   01/14/16 1011  Weight: 257 lb (116.6 kg)   BMI 40.3   Lab Results  Component Value Date   CREATININE 0.97 01/14/2016   CREATININE 1.09 (H) 11/20/2014   CREATININE 0.88 11/19/2014   Estimated Creatinine Clearance: 72.2 mL/min (by C-G formula based on SCr of 0.97 mg/dL). Hemoglobin & Hematocrit     Component Value Date/Time   HGB 12.7 01/14/2016 1011   HCT 37.9 01/14/2016 1011     Per Protocol for Patient with estCrcl > 30 ml/min and BMI > 40, will transition to Lovenox 40 mg Q12h.

## 2016-01-14 NOTE — ED Provider Notes (Signed)
[facility] Emergency Department Provider Note        Time seen: ----------------------------------------- 11:23 AM on 01/14/2016 -----------------------------------------    I have reviewed the triage vital signs and the nursing notes.   HISTORY  Chief Complaint Dizziness    HPI Molly [name] is a 70 y.o. female who presents to ER for dizziness since yesterday. Patient describes rooms pink sensation, states she has an abnormal feeling in her right ear. EMS was called to her home 3 times yesterday for assistance after falling. Patient states she has not walked in 3 weeks because of remote falls and knee injuries. She also has a wound in her left foot that she has been placing antibiotic ointment on. She denies fevers or chills, denies recent changes in her medicines.   Past Medical History:  Diagnosis Date  . Anxiety   . COPD (chronic obstructive pulmonary disease) (HCC)   . Diabetes (HCC)   . Hemorrhoid   . HLD (hyperlipidemia)   . HTN (hypertension)   . Stroke [facility](HCC) 2008   left weakness    Patient Active Problem List   Diagnosis Date Noted  . Cellulitis of left lower extremity 11/19/2014  . COPD (chronic obstructive pulmonary disease) (HCC) 11/19/2014  . HTN (hypertension) 11/19/2014  . HLD (hyperlipidemia) 11/19/2014  . Sepsis (HCC) 11/19/2014  . Cellulitis of left foot 05/21/2014  . Diabetes mellitus (HCC) 05/21/2014  . Chronic anemia 05/21/2014  . Osteomyelitis of left foot (HCC) 05/21/2014  . Foot ulcer (HCC) 02/04/2014    Past Surgical History:  Procedure Laterality Date  . AMPUTATION Left 05/22/2014   Procedure: AMPUTATION RAY LEFT FOURTH AND FIFTH ;  Surgeon: Toni ArthursJohn Hewitt, MD;  [facility];  Service: Orthopedics;  Laterality: Left;  . CATARACT EXTRACTION    . COLONOSCOPY  2004    Allergies Review of patient's allergies indicates no known allergies.  Social History Social History  Substance Use Topics  . Smoking  status: Former Smoker    Packs/day: 0.50    Years: 2.00    Types: Cigarettes    Quit date: 11/18/1968  . Smokeless tobacco: Never Used  . Alcohol use No     Comment: seldom    Review of Systems Constitutional: Negative for fever. Cardiovascular: Negative for chest pain. Respiratory: Negative for shortness of breath. Gastrointestinal: Negative for abdominal pain, vomiting and diarrhea. Genitourinary: Negative for dysuria. Musculoskeletal: Negative for back pain. Skin: Negative for rash. Neurological: Negative for headaches, focal weakness or numbness.Positive for vertigo  10-point ROS otherwise negative.  ____________________________________________   PHYSICAL EXAM:  VITAL SIGNS: ED Triage Vitals  Enc Vitals Group     BP --      Pulse Rate 01/14/16 1010 94     Resp 01/14/16 1010 18     Temp 01/14/16 1010 97.8 F (36.6 C)     Temp Source 01/14/16 1010 Oral     SpO2 01/14/16 1010 97 %     Weight 01/14/16 1011 257 lb (116.6 kg)     Height 01/14/16 1011 5\' 7"  (1.702 m)     Head Circumference --      Peak Flow --      Pain Score --      Pain Loc --      Pain Edu? --      Excl. in GC? --     Constitutional: Alert and oriented.  Eyes: Conjunctivae are normal. PERRL. Normal extraocular movements.No nystagmus ENT   Head: Normocephalic and atraumatic.  Nose: No congestion/rhinnorhea.   Mouth/Throat: Mucous membranes are moist.   Neck: No stridor. Cardiovascular: Normal rate, regular rhythm. No murmurs, rubs, or gallops. Respiratory: Normal respiratory effort without tachypnea nor retractions. Breath sounds are clear and equal bilaterally. No wheezes/rales/rhonchi. Gastrointestinal: Soft and nontender. Normal bowel sounds Musculoskeletal: Nontender with normal range of motion in all extremities. Previous lateral left foot surgery with draining wound and erythema distally Neurologic:  Normal speech and language. No gross focal neurologic deficits are  appreciated.  Skin:  Erythematous and draining wound noted to the lateral aspect of the left foot distally. Psychiatric: Mood and affect are normal. Speech and behavior are normal.  ____________________________________________  EKG: Interpreted by me. Sinus rhythm with a rate of 93 bpm, normal PR interval, wide QRS, normal QT, right bundle branch block.  ____________________________________________  ED COURSE:  Pertinent labs & imaging results that were available during my care of the patient were reviewed by me and considered in my medical decision making (see chart for details). Clinical Course  Patient presents to ER essentially with symptoms of vertigo. We will assess with labs and imaging.  Procedures ____________________________________________   LABS (pertinent positives/negatives)  Labs Reviewed  CBC WITH DIFFERENTIAL/PLATELET - Abnormal; Notable for the following:       Result Value   WBC 11.3 (*)    RDW 14.9 (*)    Neutro Abs 8.3 (*)    All other components within normal limits  COMPREHENSIVE METABOLIC PANEL - Abnormal; Notable for the following:    Chloride 98 (*)    Glucose, Bld 139 (*)    BUN 21 (*)    GFR calc non Af Amer 58 (*)    All other components within normal limits  URINALYSIS COMPLETEWITH MICROSCOPIC ([facility] ONLY) - Abnormal; Notable for the following:    Color, Urine STRAW (*)    APPearance CLEAR (*)    Protein, ur 30 (*)    Squamous Epithelial / LPF 0-5 (*)    All other components within normal limits  BLOOD GAS, VENOUS - Abnormal; Notable for the following:    pCO2, Ven 63 (*)    pO2, Ven <31.0 (*)    All other components within normal limits  TROPONIN I    RADIOLOGY Images were viewed by me  IMPRESSION: Moderately severe small vessel ischemic change throughout the periventricular white matter for age. No acute intracranial abnormality.   ____________________________________________  FINAL ASSESSMENT AND PLAN  Vertigo,  Weakness  Plan: Patient with labs and imaging as dictated above. Patient had been having difficulty ambulating for some time due to generalized weakness and immobility with arthritis. Her symptoms seem to be acutely worse with symptoms of vertigo and difficulty walking. She was very unsteady on her feet. She may require further workup for possible CVA etiology. I will discuss with the hospitalist for admission.   Emily Filbert, MD   Note: This dictation was prepared with Dragon dictation. Any transcriptional errors that result from this process are unintentional    Emily Filbert, MD 01/14/16 [phone]

## 2016-01-14 NOTE — ED Notes (Signed)
Attempted to call report

## 2016-01-14 NOTE — Progress Notes (Signed)
Patient vomited x1,zofran given with good effect. Patient still feeling dizzy when sitting up in bed,close monitoring in progress.

## 2016-01-15 ENCOUNTER — Observation Stay: Payer: Medicare HMO

## 2016-01-15 ENCOUNTER — Observation Stay (HOSPITAL_BASED_OUTPATIENT_CLINIC_OR_DEPARTMENT_OTHER)
Admit: 2016-01-15 | Discharge: 2016-01-15 | Disposition: A | Discharge status: Home / Self Care | Payer: Medicare HMO | Attending: Internal Medicine | Admitting: Internal Medicine

## 2016-01-15 DIAGNOSIS — R0600 Dyspnea, unspecified: Secondary | ICD-10-CM | POA: Diagnosis not present

## 2016-01-15 DIAGNOSIS — L8990 Pressure ulcer of unspecified site, unspecified stage: Secondary | ICD-10-CM | POA: Insufficient documentation

## 2016-01-15 LAB — BASIC METABOLIC PANEL
ANION GAP: 7 (ref 5–15)
BUN: 16 mg/dL (ref 6–20)
CHLORIDE: 101 mmol/L (ref 101–111)
CO2: 31 mmol/L (ref 22–32)
CREATININE: 0.96 mg/dL (ref 0.44–1.00)
Calcium: 9.3 mg/dL (ref 8.9–10.3)
GFR calc non Af Amer: 59 mL/min — ABNORMAL LOW (ref >60)
GLUCOSE: 194 mg/dL — AB (ref 65–99)
Potassium: 4.8 mmol/L (ref 3.5–5.1)
Sodium: 139 mmol/L (ref 135–145)

## 2016-01-15 LAB — CBC
HCT: 37.9 % (ref 35.0–47.0)
HEMOGLOBIN: 12.4 g/dL (ref 12.0–16.0)
MCH: 31.7 pg (ref 26.0–34.0)
MCHC: 32.7 g/dL (ref 32.0–36.0)
MCV: 96.9 fL (ref 80.0–100.0)
Platelets: 312 10*3/uL (ref 150–440)
RBC: 3.91 MIL/uL (ref 3.80–5.20)
RDW: 14.9 % — ABNORMAL HIGH (ref 11.5–14.5)
WBC: 10.3 10*3/uL (ref 3.6–11.0)

## 2016-01-15 LAB — GLUCOSE, CAPILLARY
GLUCOSE-CAPILLARY: 215 mg/dL — AB (ref 65–99)
GLUCOSE-CAPILLARY: 190 mg/dL — AB (ref 65–99)
GLUCOSE-CAPILLARY: 230 mg/dL — AB (ref 65–99)
Glucose-Capillary: 175 mg/dL — ABNORMAL HIGH (ref 65–99)

## 2016-01-15 MED ORDER — DOXYCYCLINE HYCLATE 100 MG PO TABS
100.0000 mg | ORAL_TABLET | Freq: Two times a day (BID) | ORAL | Status: DC
  Administered 2016-01-15 – 2016-01-16 (×3): 100 mg via ORAL
  Filled 2016-01-15 (×3): qty 1

## 2016-01-15 MED ORDER — PERFLUTREN LIPID MICROSPHERE
1.0000 mL | INTRAVENOUS | Status: AC | PRN
  Administered 2016-01-15: 3 mL via INTRAVENOUS
  Filled 2016-01-15: qty 10

## 2016-01-15 MED ORDER — INSULIN ASPART 100 UNIT/ML ~~LOC~~ SOLN
0.0000 [IU] | Freq: Every day | SUBCUTANEOUS | Status: DC
  Administered 2016-01-15: 2 [IU] via SUBCUTANEOUS
  Filled 2016-01-15: qty 2

## 2016-01-15 MED ORDER — COLLAGENASE 250 UNIT/GM EX OINT
TOPICAL_OINTMENT | Freq: Every day | CUTANEOUS | Status: DC
  Administered 2016-01-15 – 2016-01-16 (×2): via TOPICAL
  Filled 2016-01-15 (×2): qty 30

## 2016-01-15 MED ORDER — INSULIN ASPART 100 UNIT/ML ~~LOC~~ SOLN
0.0000 [IU] | Freq: Three times a day (TID) | SUBCUTANEOUS | Status: DC
  Administered 2016-01-15: 3 [IU] via SUBCUTANEOUS
  Administered 2016-01-15: 2 [IU] via SUBCUTANEOUS
  Administered 2016-01-16: 5 [IU] via SUBCUTANEOUS
  Filled 2016-01-15: qty 2
  Filled 2016-01-15: qty 3

## 2016-01-15 NOTE — Consult Note (Signed)
WOC Nurse wound consult note Reason for Consult:LEft dorsal foot neauropathic ulcer, near lateral aspect Wound type:neuropahtic, full thickness, treating at home with triple antibiotic ointment.  Pressure Ulcer POA: Yes Measurement: 2 cm x 2 cm with callous present circumferentially.  Wound bed is 100% devitalized tissue.  Wound WUJ:WJXBJYbed:eschar Drainage (amount, consistency, odor) Minimal serosnaguinous  Musty odor.  Periwound:Callous Dressing procedure/placement/frequency:Cleanse wound to left plantar foot with NS and pat gently dry.  Apply Santyl to wound bed. Cover with NS Moist gauze.  Secure with 4x4 gauze and kerlix/tape  Change daily.  Will not follow at this time.  Please re-consult if needed.  Maple HudsonKaren Elain Wixon RN BSN CWON Pager [phone]

## 2016-01-15 NOTE — Clinical Social Work Note (Signed)
MSW received consult for patient wanting to go to SNF for short term rehab.  MSW met with patient and explained to her it will depend on PT and OT recommendations and insurance approval.  MSW explained to patient once therapy sees her then MSW will send information to insurance company to review.  MSW informed her that if insurance denies snf approval she will have to go home with home health or pay privately.  Patient stated she is hopeful that she will be approved for SNF once PT and OT work with her.  MSW to complete formal assessment at a later time.  Patient gave MSW permission to fax out to SNFs in [facility].  Jones Broom. Molly [name], MSW [phone]  Mon-Fri 8a-4:30p 01/15/2016 5:10 PM

## 2016-01-15 NOTE — Progress Notes (Addendum)
[facility] [place] [facility]   PATIENT NAME: Molly DustmanBrenda [name]    MR#:  [id number]  DATE OF BIRTH:  [birth year]  SUBJECTIVE:  CHIEF COMPLAINT:  Patient is feeling like room is spinning around her. Meclizine is helping. Reporting frequent falls  REVIEW OF SYSTEMS:  CONSTITUTIONAL: No fever, fatigue or weakness.  EYES: No blurred or double vision.  EARS, NOSE, AND THROAT: No tinnitus or ear pain.  RESPIRATORY: No cough, shortness of breath, wheezing or hemoptysis.  CARDIOVASCULAR: No chest pain, orthopnea, edema.  GASTROINTESTINAL: No nausea, vomiting, diarrhea or abdominal pain.  GENITOURINARY: No dysuria, hematuria.  ENDOCRINE: No polyuria, nocturia,  HEMATOLOGY: No anemia, easy bruising or bleeding SKIN: No rash or lesion. MUSCULOSKELETAL: No joint pain or arthritis.   NEUROLOGIC:Reportin dizzinessg No tingling, numbness,  PSYCHIATRY: No anxiety or depression.   DRUG ALLERGIES:  No Known Allergies  VITALS:  Blood pressure (!) 145/71, pulse 92, temperature 97.7 F (36.5 C), temperature source Oral, resp. rate 18, height 5\' 6"  (1.676 m), weight (!) 139.9 kg (308 lb 8 oz), SpO2 90 %.  PHYSICAL EXAMINATION:  GENERAL:  70 y.o.-year-old patient lying in the bed with no acute distress.  EYES: Pupils equal, round, reactive to light and accommodation. No scleral icterus. Extraocular muscles intact.  HEENT: Head atraumatic, normocephalic. Oropharynx and nasopharynx clear.  NECK:  Supple, no jugular venous distention. No thyroid enlargement, no tenderness.  LUNGS: Normal breath sounds bilaterally, no wheezing, rales,rhonchi or crepitation. No use of accessory muscles of respiration.  CARDIOVASCULAR: S1, S2 normal. No murmurs, rubs, or gallops.  ABDOMEN: Soft, nontender, nondistended. Bowel sounds present. No organomegaly or mass.  EXTREMITIES: No pedal edema, cyanosis, or clubbing. Left foot dorsal and plantar aspect erythematous with desquamation , no  purulent discharge noticed  NEUROLOGIC: Cranial nerves II through XII are intact. Muscle strength 3/5 in all extremities. Sensation intact. Gait not checked.  PSYCHIATRIC: The patient is alert and oriented x 3.  SKIN: No obvious rash, lesion, or ulcer.    LABORATORY PANEL:   CBC  Recent Labs Lab 01/15/16 0510  WBC 10.3  HGB 12.4  HCT 37.9  PLT 312   ------------------------------------------------------------------------------------------------------------------  Chemistries   Recent Labs Lab 01/14/16 1011 01/15/16 0510  NA 137 139  K 4.6 4.8  CL 98* 101  CO2 30 31  GLUCOSE 139* 194*  BUN 21* 16  CREATININE 0.97 0.96  CALCIUM 9.5 9.3  AST 25  --   ALT 40  --   ALKPHOS 71  --   BILITOT 0.6  --    ------------------------------------------------------------------------------------------------------------------  Cardiac Enzymes  Recent Labs Lab 01/14/16 1011  TROPONINI <0.03   ------------------------------------------------------------------------------------------------------------------  RADIOLOGY:  Dg Knee 1-2 Views Left  Result Date: 01/15/2016 CLINICAL DATA:  Fall. EXAM: LEFT KNEE - 1-2 VIEW COMPARISON:  None. FINDINGS: No evidence of fracture, dislocation, or joint effusion. Severe narrowing of lateral joint space is noted with osteophyte formation. Moderate narrowing of patellofemoral space is noted. Soft tissues are unremarkable. 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 No acute abnormality seen in the left knee. Electronically Signed   By: Lupita RaiderJames  Green Jr, M.D.   On: 01/15/2016 14:41   Ct Head Wo Contrast  Result Date: 01/14/2016 CLINICAL DATA:  Dizziness for 1 day, recent falls EXAM: CT HEAD WITHOUT CONTRAST TECHNIQUE: Contiguous axial images were obtained from the base of the skull through the vertex without intravenous contrast. COMPARISON:  CT brain scan of 07/31/2015 FINDINGS: Brain: The ventricular system is  minimally  prominent, with moderately severe small vessel ischemic changes noted throughout the periventricular white matter. The septum is midline in position. The fourth ventricle and basilar cisterns are unremarkable. No hemorrhage, mass lesion, or acute infarction is seen. Vascular: There is dense calcification of the right vertebral artery. Skull: No acute calvarial abnormality is seen. Sinuses/Orbits: The paranasal sinuses are pneumatized. Other: None IMPRESSION: Moderately severe small vessel ischemic change throughout the periventricular white matter for age. No acute intracranial abnormality. Electronically Signed   By: Dwyane Dee M.D.   On: 01/14/2016 10:35   Mr Brain Wo Contrast  Result Date: 01/14/2016 CLINICAL DATA:  70 year old diabetic hypertensive female with dizziness since yesterday. Subsequent encounter. EXAM: MRI HEAD WITHOUT CONTRAST TECHNIQUE: Multiplanar, multiecho pulse sequences of the brain and surrounding structures were obtained without intravenous contrast. COMPARISON:  01/14/2016 CT.  10/18/2006 brain MR. FINDINGS: Brain: No acute infarct or intracranial hemorrhage. Remote infarct posterior limb right internal capsule and medial right temporal lobe. Wallerian degeneration with small right cerebral peduncle. Remote small left cerebellar infarct. Marked chronic microvascular changes. Global atrophy without hydrocephalus. No intracranial mass lesion noted on this unenhanced exam. Hyperostosis frontalis interna an incidentally noted. Vascular: Major intracranial vascular structures are patent. Skull and upper cervical spine: Mild degenerative changes upper cervical spine. Sinuses/Orbits: Post lens replacement. Exophthalmos. Minimal mucosal thickening frontal sinuses and ethmoid sinus air cells. Minimal partial opacification mastoid air cells more notable on the right. Other: Negative IMPRESSION: Motion degraded examination. No acute infarct or intracranial hemorrhage. Remote infarct posterior  limb right internal capsule and medial right temporal lobe. Wallerian degeneration with small right cerebral peduncle. Remote small left cerebellar infarct. Marked chronic microvascular changes. Global atrophy. Electronically Signed   By: Lacy Duverney M.D.   On: 01/14/2016 15:30   Dg Knee 2 Views Right  Result Date: 01/15/2016 CLINICAL DATA:  70 year old female with knee pain. Initial encounter. Diabetes. Previous lower extremity amputation. EXAM: RIGHT KNEE - 3 VIEW COMPARISON:  Right knee series 5317 FINDINGS: Severe tricompartmental degenerative spurring. Severe patellofemoral joint space loss. Moderate medial greater than lateral compartment joint space loss. Chronic dystrophic calcification in the lateral distal quadriceps. Chronic dystrophic calcification about the medial collateral ligament attachment on the proximal tibia. No definite joint effusion. No acute osseous abnormality identified. Calcified peripheral vascular disease. IMPRESSION: 1. Severe tricompartmental degenerative changes, probably worst in the patellofemoral compartment. No acute osseous abnormality identified. 2. Calcified peripheral vascular disease. 3. 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 Electronically Signed   By: Odessa Fleming M.D.   On: 01/15/2016 14:42    EKG:   Orders placed or performed during the hospital encounter of 01/14/16  . ED EKG  . ED EKG  . EKG 12-Lead  . EKG 12-Lead    ASSESSMENT AND PLAN:   1.Vertigo; likely benign positional vertigo. on meclizine. Patient's vertigo is improving with meclizine  Initial head CT to unremarkable.  No acute findings on MRI of the brain.  Pending results of ultrasound of carotids  Echo pending PT evaluation rescheduled for tomorrow Outpatient follow-up with ENT is recommended  Instructed patient not to drive or operate heavy machinery until seen and cleared by ENT after discharge    #2 diabetes mellitus type  2: Patient is on high-dose 70/30  insulin 120 units in the morning, 100 units at night as per patient's family. ISS for now   Consult diabetic team pending  #3 history of previous stroke: Patient is on PlaviX  #Frequent  falls Bilateral knee x-rays with severe DJD but no fractures PT evaluation is pending  #Left  foot skin infection Consults wound care Start patient on doxycycline.  Depression: Continue Elavil, Wellbutrin.  #4.essential hypertension: Continue losartan.  Generalized weakness with frequent falls: PT evaluation pending will be benefited with skilled nursing care facility placement for rehabilitation Follow up with social worker   D/w husband     All the records are reviewed and case discussed with Care Management/Social Workerr. Management plans discussed with the patient, family and they are in agreement.  CODE STATUS: FC   TOTAL TIME TAKING CARE OF THIS PATIENT: 36  minutes.   POSSIBLE D/C IN 1-2 DAYS, DEPENDING ON CLINICAL CONDITION.  Note: This dictation was prepared with Dragon dictation along with smaller phrase technology. Any transcriptional errors that result from this process are unintentional.   Ramonita Lab M.D on 01/15/2016 at 3:28 PM  Between 7am to 6pm - Pager - [phone] After 6pm go to [email or web address] - password [email or web address] [facility]  [place] [facility]  Office  [phone]  CC: Primary care physician; Elita Boone, MD

## 2016-01-15 NOTE — Progress Notes (Signed)
PT Cancellation Note  Patient Details Name: Molly HughsBrenda P Brownlee MRN: [id number] DOB: [birth year]   Cancelled Treatment:    Reason Eval/Treat Not Completed: Medical issues which prohibited therapy; Attempted to see pt, but MD ordering X-ray of  L LE; awaiting results.  Husband spoke with PT who states he is the primary caregiver for the pt and that he can no longer take care of pt due to her debility over the last 3 months with decreased ambulation and now t/f's to wheelchair.  Will continue to follow.   Kody Vigil A Lyfe Monger, PT 01/15/2016, 12:54 PM

## 2016-01-15 NOTE — Progress Notes (Signed)
PT Cancellation Note  Patient Details Name: Ardis HughsBrenda P Bartl MRN: [id number] DOB: [birth year]   Cancelled Treatment:    Reason Eval/Treat Not Completed: Medical issues which prohibited therapy (Bilat LE x-rays still pending; will re-attempt next date as results received and patient cleared for activity/WBing.)   Jenica Costilow H. Manson PasseyBrown, PT, DPT, NCS 01/15/16, 2:16 PM [phone]

## 2016-01-15 NOTE — Progress Notes (Signed)
Patient reported taking 70/30 units novolog insulin at home. Patient husband at bedside and reported that patient takes 120 units at lunch and 100 units at bedtime. MD paged and notified. Orders given to hold Metformin and MD will put in sliding scale.

## 2016-01-15 NOTE — Progress Notes (Addendum)
Inpatient Diabetes Program Recommendations  AACE/ADA: New Consensus Statement on Inpatient Glycemic Control (2015)  Target Ranges:  Prepandial:   less than 140 mg/dL      Peak postprandial:   less than 180 mg/dL (1-2 hours)      Critically ill patients:  140 - 180 mg/dL   Results for Ardis HughsGARNER, Tametria P (MRN [id number]) as of 01/15/2016 15:16  Ref. Range 01/14/2016 17:08 01/14/2016 21:24 01/15/2016 08:04 01/15/2016 12:04  Glucose-Capillary Latest Ref Range: 65 - 99 mg/dL 782138 (H) 956221 (H) 213175 (H) 215 (H)   Review of Glycemic Control  Diabetes history: DM2 Outpatient Diabetes medications: 70/30 120 units QAM and 70/30 100 units QHS, Metformin 500 mg BID Current orders for Inpatient glycemic control: Novolog 0-9 units TID with meals, Novolog 0-5 units QHS  Inpatient Diabetes Program Recommendations:  Correction (SSI): Please consider increasing Novolog correction to moderate scale. Insulin - Basal: If Novolog correction scale is increased as recommended and glucose continues to be consistently greater than 180 mg/dl, please consider ordering low dose basal insulin.  NOTE: Noted Consult for Diabetes Coordinator. Went by patient's room x 2 this afternoon and patient was off floor. Chart reviewed. Will continue to follow while inpatient.  Thanks, Orlando PennerMarie Lucien Budney, RN, MSN, CDE Diabetes Coordinator Inpatient Diabetes Program [phone] (Team Pager from 8am to 5pm) [phone] ([facility] office) [phone] [facility] office) [phone] [facility] office)

## 2016-01-16 LAB — HEMOGLOBIN A1C
HEMOGLOBIN A1C: 7 % — AB (ref 4.8–5.6)
Mean Plasma Glucose: 154 mg/dL

## 2016-01-16 LAB — ECHOCARDIOGRAM COMPLETE
Height: 66 in
Weight: 4936 oz

## 2016-01-16 LAB — GLUCOSE, CAPILLARY
GLUCOSE-CAPILLARY: 313 mg/dL — AB (ref 65–99)
GLUCOSE-CAPILLARY: 249 mg/dL — AB (ref 65–99)
Glucose-Capillary: 263 mg/dL — ABNORMAL HIGH (ref 65–99)

## 2016-01-16 MED ORDER — INSULIN ASPART 100 UNIT/ML ~~LOC~~ SOLN
0.0000 [IU] | Freq: Three times a day (TID) | SUBCUTANEOUS | Status: DC
Start: 2016-01-16 — End: 2016-01-16
  Administered 2016-01-16: 5 [IU] via SUBCUTANEOUS
  Administered 2016-01-16: 11 [IU] via SUBCUTANEOUS
  Filled 2016-01-16: qty 11
  Filled 2016-01-16 (×2): qty 5

## 2016-01-16 MED ORDER — DOXYCYCLINE HYCLATE 100 MG PO TABS: 100.0000 mg | ORAL_TABLET | Freq: Two times a day (BID) | ORAL | 0 refills | Status: DC

## 2016-01-16 MED ORDER — COLLAGENASE 250 UNIT/GM EX OINT
TOPICAL_OINTMENT | Freq: Every day | CUTANEOUS | 0 refills | Status: DC
Start: 2016-01-17 — End: 2016-02-11

## 2016-01-16 MED ORDER — BISACODYL 5 MG PO TBEC: 5.0000 mg | DELAYED_RELEASE_TABLET | Freq: Every day | ORAL | 0 refills | Status: DC | PRN

## 2016-01-16 MED ORDER — INSULIN ASPART 100 UNIT/ML ~~LOC~~ SOLN: 0.0000 [IU] | Freq: Every day | SUBCUTANEOUS | 11 refills | Status: DC

## 2016-01-16 MED ORDER — MECLIZINE HCL 25 MG PO TABS
25.0000 mg | ORAL_TABLET | Freq: Three times a day (TID) | ORAL | 0 refills | Status: DC
Start: 2016-01-16 — End: 2019-09-21

## 2016-01-16 MED ORDER — INSULIN ASPART 100 UNIT/ML ~~LOC~~ SOLN: 0.0000 [IU] | Freq: Three times a day (TID) | SUBCUTANEOUS | 11 refills | Status: DC

## 2016-01-16 NOTE — Progress Notes (Signed)
Inpatient Diabetes Program Recommendations  AACE/ADA: New Consensus Statement on Inpatient Glycemic Control (2015)  Target Ranges:  Prepandial:   less than 140 mg/dL      Peak postprandial:   less than 180 mg/dL (1-2 hours)      Critically ill patients:  140 - 180 mg/dL   Lab Results  Component Value Date   GLUCAP 263 (H) 01/16/2016   HGBA1C 7.0 (H) 01/15/2016    Review of Glycemic Control  Results for Molly [name], Molly [name] (MRN [id number]) as of 01/16/2016 09:49  Ref. Range 01/15/2016 08:04 01/15/2016 12:04 01/15/2016 16:32 01/15/2016 21:36 01/16/2016 07:33  Glucose-Capillary Latest Ref Range: 65 - 99 mg/dL 175 (H) 215 (H) 190 (H) 230 (H) 263 (H)    Diabetes history: DM2 Outpatient Diabetes medications: 70/30 120 units QAM and 70/30 100 units QHS, Metformin 500 mg BID  Current orders for Inpatient glycemic control: Novolog 0-9 units TID with meals, Novolog 0-5 units QHS  Inpatient Diabetes Program Recommendations:   Please consider ordering low dose basal insulin.  NOTE: Noted Consult for Diabetes Coordinator. Went by patient's room x 2 on 01/15/16 and patient was off floor.   Met with her today- she did confirm she is taking Novolog 70/30 insulin 120 units qam and 100 units qpm.  She stores her pens in the refrigerator and the pen she is using she keeps out.  She rotates between 2 sites on her abdomen and confirms sometimes she meets resistance when she gives the  Insulin- she then gives it in an alternative site. Strongly encouraged to use her entire abdomen for insulin administration.  Reports her husband also has insulin and that she has never had a low blood sugar.  Her MD switched her from Lantus plus mealtime insulin to 70/30 for ease.  She reports fasting blood sugars 150-154m/dl and bedtime blood sugars over 200 mg/dl.   I have asked her to ask MD about U 5000 next time she sees the MD.  Patient reports that the MD gave her permission to increase her current insulin doses  according to blood sugars but she has not done it.    All questions answered- she does not have any additional questions at this time.  JGentry Fitz RN, BA, MHA, CDE Diabetes Coordinator Inpatient Diabetes Program  [phone](Team Pager) [phone](ADeloit 01/16/2016 1:07 PM

## 2016-01-16 NOTE — Clinical Social Work Note (Signed)
[facility]  Patient and family agreeable to plans will transport via ems RN to call report to [phone]  Windell MouldingEric Revecca Nachtigal, MSW Mon-Fri 8a-4:30p [phone]

## 2016-01-16 NOTE — [place] FL2 (Signed)
[place] MEDICAID FL2 LEVEL OF CARE SCREENING TOOL     IDENTIFICATION  Patient Name: Molly [name] Birthdate: [birth year] Sex: female Admission Date (Current Location): 01/14/2016  [place]   Facility and Address:  [facility], [address], [place], [place] [address]      Provider Number: [id number]  Attending Physician Name and Address:  Ramonita LabAruna Ameir Faria, MD  Relative Name and Phone Number:  Molly [name],Frio Spouse   [phone]     Current Level of Care: Hospital Recommended Level of Care: Skilled Nursing Facility Prior Approval Number:    Date Approved/Denied:   PASRR Number: [id number]  Discharge Plan: SNF    Current Diagnoses: Patient Active Problem List   Diagnosis Date Noted  . Pressure injury of skin 01/15/2016  . Dizziness 01/14/2016  . Cellulitis of left lower extremity 11/19/2014  . COPD (chronic obstructive pulmonary disease) (HCC) 11/19/2014  . HTN (hypertension) 11/19/2014  . HLD (hyperlipidemia) 11/19/2014  . Sepsis (HCC) 11/19/2014  . Cellulitis of left foot 05/21/2014  . Diabetes mellitus (HCC) 05/21/2014  . Chronic anemia 05/21/2014  . Osteomyelitis of left foot (HCC) 05/21/2014  . Foot ulcer (HCC) 02/04/2014    Orientation RESPIRATION BLADDER Height & Weight     Self, Time, Situation, Place  Normal Incontinent Weight: (!) 306 lb 6.4 oz (139 kg) Height:  5\' 6"  (167.6 cm)  BEHAVIORAL SYMPTOMS/MOOD NEUROLOGICAL BOWEL NUTRITION STATUS      Continent Diet (2g sodium diet)  AMBULATORY STATUS COMMUNICATION OF NEEDS Skin   Limited Assist Verbally PU Stage and Appropriate Care PU Stage 1 Dressing: Daily                     Personal Care Assistance Level of Assistance  Bathing, Feeding, Dressing Bathing Assistance: Limited assistance Feeding assistance: Limited assistance Dressing Assistance: Limited assistance     Functional Limitations Info  Sight, Hearing, Speech Sight Info:  Adequate Hearing Info: Adequate Speech Info: Adequate    SPECIAL CARE FACTORS FREQUENCY  PT (By licensed PT)     PT Frequency: 5x a week              Contractures Contractures Info: Not present    Additional Factors Info  Insulin Sliding Scale, Code Status, Psychotropic Code Status Info: Full   Psychotropic Info: buPROPion (WELLBUTRIN XL) 24 hr tablet 300 mg,  Insulin Sliding Scale Info: 3x a day       Current Medications (01/16/2016):  This is the current hospital active medication list Current Facility-Administered Medications  Medication Dose Route Frequency Provider Last Rate Last Dose  . acetaminophen (TYLENOL) tablet 650 mg  650 mg Oral Q6H PRN Katha HammingSnehalatha Konidena, MD       Or  . acetaminophen (TYLENOL) suppository 650 mg  650 mg Rectal Q6H PRN Katha HammingSnehalatha Konidena, MD      . amitriptyline (ELAVIL) tablet 50 mg  50 mg Oral Daily Katha HammingSnehalatha Konidena, MD   50 mg at 01/16/16 0933  . aspirin EC tablet 81 mg  81 mg Oral Daily Katha HammingSnehalatha Konidena, MD   81 mg at 01/16/16 0933  . bisacodyl (DULCOLAX) EC tablet 5 mg  5 mg Oral Daily PRN Katha HammingSnehalatha Konidena, MD      . buPROPion (WELLBUTRIN XL) 24 hr tablet 300 mg  300 mg Oral Daily Katha HammingSnehalatha Konidena, MD   300 mg at 01/16/16 0933  . calcium carbonate (TUMS - dosed in mg elemental calcium) chewable tablet 500 mg  500 mg Oral  Daily Katha Hamming, MD   500 mg at 01/16/16 0932  . clopidogrel (PLAVIX) tablet 75 mg  75 mg Oral Daily Katha Hamming, MD   75 mg at 01/16/16 0933  . collagenase (SANTYL) ointment   Topical Daily Ramonita Lab, MD      . docusate sodium (COLACE) capsule 100 mg  100 mg Oral BID Katha Hamming, MD   100 mg at 01/16/16 0933  . doxycycline (VIBRA-TABS) tablet 100 mg  100 mg Oral Q12H Ramonita Lab, MD   100 mg at 01/16/16 0933  . enoxaparin (LOVENOX) injection 40 mg  40 mg Subcutaneous Q12H Katha Hamming, MD   40 mg at 01/16/16 0932  . insulin aspart (novoLOG) injection 0-15 Units  0-15 Units  Subcutaneous TID WC Merrell Borsuk, MD      . insulin aspart (novoLOG) injection 0-5 Units  0-5 Units Subcutaneous QHS Ramonita Lab, MD   2 Units at 01/15/16 2200  . losartan (COZAAR) tablet 50 mg  50 mg Oral Daily Katha Hamming, MD   50 mg at 01/16/16 0933  . magnesium oxide (MAG-OX) tablet 400 mg  400 mg Oral BID Katha Hamming, MD   400 mg at 01/16/16 0934  . meclizine (ANTIVERT) tablet 25 mg  25 mg Oral TID Katha Hamming, MD   25 mg at 01/16/16 0934  . Melatonin TABS 10 mg  10 mg Oral QHS Katha Hamming, MD   10 mg at 01/15/16 2226  . ondansetron (ZOFRAN) tablet 4 mg  4 mg Oral Q6H PRN Katha Hamming, MD       Or  . ondansetron (ZOFRAN) injection 4 mg  4 mg Intravenous Q6H PRN Katha Hamming, MD   4 mg at 01/15/16 1145  . traZODone (DESYREL) tablet 25 mg  25 mg Oral QHS PRN Katha Hamming, MD      . vitamin C (ASCORBIC ACID) tablet 1,000 mg  1,000 mg Oral Daily Katha Hamming, MD   1,000 mg at 01/16/16 4098     Discharge Medications: Please see discharge summary for a list of discharge medications.  Relevant Imaging Results:  Relevant Lab Results:   Additional Information SSN [id number]  [name]

## 2016-01-16 NOTE — Evaluation (Signed)
Occupational Therapy Evaluation Patient Details Name: Molly [name] MRN: [id number] DOB: November 03, 1945 Today's Date: 01/16/2016    History of Present Illness Molly [name]  is a 70 y.o. female with a known history of Hypertension, diabetes, previous history of stroke with left-sided weakness more so on the left arm complaints of dizziness for the past 2 days. Patient feels very dizzy even with minimal movement of head, having ambulatory difficulties because of the dizziness. Also complains of nausea associated with dizziness. No double vision or blurred vision. No headache. Patient had history of previous stroke and has been falling lately a lot. Uses cane at home at baseline. Started on Wellbutrin a month ago. Pt reports 1-2 falls/week   Clinical Impression   Pt. Is a 70 year  y.o. female who was admitted for increased left sided weakness and dizziness for past 2 days. Pt. presents with no active movement in LUE and hand and completed all ADLs with RUE only.  She needs assist for cutting meat, opening containers and cues for seeing entire visual field due to visual feld cut from prior CVA.  In addition she has overall weakness which limits her ability to participate in functional mobility and transfers.  She has a electric sit to stand chair at home that she stays in most of the day and sleeps in it as well.  She is at risk for skin breakdown due to decreased mobility and at risk for falls.  He husband has been caring for her and uses a transfer tub bench for bathing, which is anticipated to be very unsafe at this time due to decreased strength, decreased functional hand use, no active volitional movement elicited, and decreased functional mobility. Patient could benefit from skilled OT services to work on increasing independence in ADLs to decrease caregiver assist, prevent skin breakdown and decrease falls.   Patient would be a good candidate to continue OT services at SNF.    Follow Up  Recommendations  SNF    Equipment Recommendations       Recommendations for Other Services       Precautions / Restrictions Precautions Precautions: Fall Restrictions Weight Bearing Restrictions: No      Mobility Bed Mobility                  Transfers                      Balance                                            ADL Overall ADL's : Needs assistance/impaired Eating/Feeding: Minimal assistance;Set up Eating/Feeding Details (indicate cue type and reason): cueing for vision and assist to open containers, cut meat and ensure she can see everything on tray due to visual field cut. Grooming: Wash/dry hands;Wash/dry face;Oral care;Applying deodorant;Brushing hair;Minimal assistance Grooming Details (indicate cue type and reason): mod cues to complete left side and min assist to open toothpaste and manage container to spit in using R UE only.         Upper Body Dressing : Moderate assistance;Set up;Bed level   Lower Body Dressing: Total assistance Lower Body Dressing Details (indicate cue type and reason): pt very limited in functional mobility in bed to participate in LB dressing and is not used to having a bed due to using an Human resources officer at  home for sit to stand.                     Vision Additional Comments: Pt has a L field cut with poor eye control and movements in all planes are not smooth; minimal convergence in B eyes; visual field to center only and very minimal vision past on L in superior and infterior fields   Perception     Praxis      Pertinent Vitals/Pain Pain Assessment: No/denies pain     Hand Dominance Right   Extremity/Trunk Assessment Upper Extremity Assessment Upper Extremity Assessment: LUE deficits/detail LUE Deficits / Details: Pt with no active movement in LUE with flexion contractures in fingers but good PROM to all joints in UE and hand. She has increased tone with elbow  flexion/extension.  Would benefit from resting hand splint for night wear.   Lower Extremity Assessment Lower Extremity Assessment: Defer to PT evaluation       Communication Communication Communication: No difficulties   Cognition Arousal/Alertness: Awake/alert Behavior During Therapy: WFL for tasks assessed/performed Overall Cognitive Status: Within Functional Limits for tasks assessed                     General Comments       Exercises       Shoulder Instructions      Home Living Family/patient expects to be discharged to:: Private residence Living Arrangements: Spouse/significant other Available Help at Discharge: Family Type of Home: House Home Access: Stairs to enter [employer] of Steps: 2   Home Layout: Multi-level;Able to live on main level with bedroom/bathroom     Bathroom Shower/Tub: Tub/shower unit Shower/tub characteristics: Engineer, building services:  (used BSC next to sit to Dance movement psychotherapist that is Mining engineer) Bathroom Accessibility: Yes   Home Equipment: Cane - single point;Walker - standard;Wheelchair - manual;Bedside commode;Hand held shower head;Tub bench   Additional Comments: husband helps her bathe using transfer tub bench      Prior Functioning/Environment Level of Independence: Needs assistance  Gait / Transfers Assistance Needed: Pt reports recently she has been having increased falls and difficulty with ambulation. Previously was using a spc for ambulation ADL's / Homemaking Assistance Needed: Husband assisting with ADLs/IADLs; she stays in electric sit to stand chair which lies flat and she sleeps in there too.  BSC next to chair.            OT Problem List: Decreased strength;Decreased range of motion;Decreased activity tolerance;Decreased coordination;Impaired tone;Impaired UE functional use;Impaired vision/perception;Impaired balance (sitting and/or standing)   OT Treatment/Interventions: Self-care/ADL  training;Patient/family education;Neuromuscular education;Visual/perceptual remediation/compensation;Balance training    OT Goals(Current goals can be found in the care plan section) Acute Rehab OT Goals Patient Stated Goal: "I need to go to rehab" OT Goal Formulation: With patient Time For Goal Achievement: 01/30/16 Potential to Achieve Goals: Good ADL Goals Pt Will Perform Eating: with set-up;with supervision;bed level (using visual tech to scan both sides) Pt Will Perform Upper Body Dressing: with min assist;sitting Pt Will Perform Lower Body Dressing: with mod assist;sit to/from stand (with no LOB) Pt Will Transfer to Toilet: with max assist;bedside commode  OT Frequency: Min 1X/week   Barriers to D/C:            Co-evaluation              End of Session    Activity Tolerance: Patient tolerated treatment well Patient left: in bed;with call bell/phone within reach;with bed alarm set;with family/visitor present  Time: 1310-1355 OT Time Calculation (min): 45 min Charges:  OT General Charges $OT Visit: 1 Procedure OT Evaluation $OT Eval Moderate Complexity: 1 Procedure OT Treatments $Self Care/Home Management : 23-37 mins G-Codes: OT G-codes **NOT FOR INPATIENT CLASS** Functional Limitation: Self care Self Care Current Status (F6213(G8987): At least 80 percent but less than 100 percent impaired, limited or restricted Self Care Goal Status ([id number](G8988): At least 60 percent but less than 80 percent impaired, limited or restricted Susanne BordersSusan Wofford, OTR/L ascom [phone] 01/16/16, 2:15 PM

## 2016-01-16 NOTE — Evaluation (Signed)
Physical Therapy Evaluation Patient Details Name: ABENA ERDMAN MRN: [id number] DOB: 1945/06/03 Today's Date: 01/16/2016   History of Present Illness  Tiondra Fang  is a 70 y.o. female with a known history of Hypertension, diabetes, previous history of stroke with left-sided weakness more so on the left arm complaints of dizziness for the past 2 days. Patient feels very dizzy even with minimal movement of head, having ambulatory difficulties because of the dizziness. Also complains of nausea associated with dizziness. No double vision or blurred vision. No headache. Patient had history of previous stroke and has been falling lately a lot. Uses cane at home at baseline. Started on Wellbutrin a month ago. Pt reports 1-2 falls/week  Clinical Impression  Pt admitted with above diagnosis. Pt currently with functional limitations due to the deficits listed below (see PT Problem List).  Pt is severely weak and requires maxA+1 for bed mobility and attempted transfers. She is unable to come to full standing posture and falls over quickly with attempt. Unable to ambulate at this time. Pt with L visual field deficit at baseline due to chronic CVA. She is lacking vertical gaze with smooth pursuits and her visual tracking is very saccadic. Abnormal horizontal and vertical saccades. VOR is negative but head impulse is positive to the L. Unsure if this is new or chronic. Test of skew is negative. No spontaneous or gaze evoked nystagmus noted. Sidelying test and roll test negative for positional vertigo. Pt denies any new visual or auditory changes. Denies tinnitis, aural fullness, otalgia, or otorrhea. 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 Pt denies recent viral illness. Pt should follow-up with ENT at discharge but will likely need to see neurologist as history and physical findings more consistent with central etiology for vertigo. Pt  will benefit from skilled PT services to address deficits in strength, balance, and mobility in order to return to full function at home.       Follow Up Recommendations SNF    Equipment Recommendations  None recommended by PT;Other (comment) (TBD by facility)    Recommendations for Other Services       Precautions / Restrictions Precautions Precautions: Fall Restrictions Weight Bearing Restrictions: No      Mobility  Bed Mobility Overal bed mobility: Needs Assistance Bed Mobility: Supine to Sit;Sit to Supine     Supine to sit: Max assist Sit to supine: Max assist   General bed mobility comments: Pt with very poor sitting balance initially requiring support to remain upright due to falling posteriorly. Pt requires assist to scoot up toward HOB and up toward EOB  Transfers Overall transfer level: Needs assistance Equipment used: Rolling walker (2 wheeled) Transfers: Sit to/from Stand Sit to Stand: Max assist;From elevated surface         General transfer comment: Pt unable to come to full standing due to weakness. She is unable to adequately shift weight and immediately falls backwards onto bed.   Ambulation/Gait             General Gait Details: Unable  Stairs            Wheelchair Mobility    Modified Rankin (Stroke Patients Only)       Balance Overall balance assessment: Needs assistance Sitting-balance support: No upper extremity supported Sitting balance-Leahy Scale: Fair Sitting balance - Comments: Initially poor but after increased time upright CGA only for sitting     Standing balance-Leahy Scale:  Zero                               Pertinent Vitals/Pain Pain Assessment: No/denies pain    Home Living Family/patient expects to be discharged to:: Private residence Living Arrangements: Spouse/significant other Available Help at Discharge: Family Type of Home: House Home Access: Stairs to enter Entrance Stairs-Rails:   (Grab bars to hold) Entrance Stairs-Number of Steps: 2 Home Layout: Multi-level;Able to live on main level with bedroom/bathroom Home Equipment: Gilmer MorCane - single point;Walker - standard;Wheelchair - manual;Bedside commode      Prior Function Level of Independence: Needs assistance   Gait / Transfers Assistance Needed: Pt reports recently she has been having increased falls and difficulty with ambulation. Previously was using a spc for ambulation  ADL's / Homemaking Assistance Needed: Husband assisting with ADLs/IADLs        Hand Dominance   Dominant Hand: Right    Extremity/Trunk Assessment   Upper Extremity Assessment: LUE deficits/detail;Generalized weakness       LUE Deficits / Details: Pt with severe LUE weakness which is secondary to chronic stroke. She has increased tone with elbow flexion/extension as well as increased finger grip flexion tone.    Lower Extremity Assessment: LLE deficits/detail;Generalized weakness   LLE Deficits / Details: Pt with 4-/5 L hip flexion and L ankle DF. 4+/5 L knee flexion/extension. pt reports chronic LLE weakness secondary to chronic CVA     Communication   Communication: No difficulties  Cognition Arousal/Alertness: Awake/alert Behavior During Therapy: WFL for tasks assessed/performed Overall Cognitive Status: Within Functional Limits for tasks assessed                      General Comments      Exercises     Assessment/Plan    PT Assessment Patient needs continued PT services  PT Problem List Decreased strength;Decreased activity tolerance;Decreased balance;Decreased mobility;Decreased knowledge of use of DME;Decreased safety awareness;Obesity          PT Treatment Interventions DME instruction;Gait training;Stair training;Functional mobility training;Therapeutic activities;Therapeutic exercise;Balance training;Neuromuscular re-education;Patient/family education;Manual techniques;Wheelchair mobility training    PT  Goals (Current goals can be found in the Care Plan section)  Acute Rehab PT Goals Patient Stated Goal: "I need to go to rehab" PT Goal Formulation: With patient Time For Goal Achievement: 01/30/16 Potential to Achieve Goals: Fair    Frequency Min 2X/week   Barriers to discharge Decreased caregiver support Husband reports he can no longer care for patient    Co-evaluation               End of Session Equipment Utilized During Treatment: Gait belt Activity Tolerance: Patient tolerated treatment well Patient left: in bed;with call bell/phone within reach;with bed alarm set Nurse Communication: Mobility status    Functional Assessment Tool Used: clinical judgement Functional Limitation: Mobility: Walking and moving around Mobility: Walking and Moving Around Current Status ([id number](G8978): At least 80 percent but less than 100 percent impaired, limited or restricted Mobility: Walking and Moving Around Goal Status [phone]([id number]): At least 40 percent but less than 60 percent impaired, limited or restricted    Time: 0850-0930 PT Time Calculation (min) (ACUTE ONLY): 40 min   Charges:   PT Evaluation $PT Eval Moderate Complexity: 1 Procedure PT Treatments $Therapeutic Activity: 8-22 mins   PT G Codes:   PT G-Codes **NOT FOR INPATIENT CLASS** Functional Assessment Tool Used: clinical judgement Functional Limitation: Mobility: Walking and moving  around Mobility: Walking and Moving Around Current Status [phone]): At least 80 percent but less than 100 percent impaired, limited or restricted Mobility: Walking and Moving Around Goal Status [phone]): At least 40 percent but less than 60 percent impaired, limited or restricted   Lynnea Maizes PT, DPT   Deretha Ertle 01/16/2016, 10:49 AM

## 2016-01-16 NOTE — Clinical Social Work Note (Signed)
Clinical Social Work Assessment  Patient Details  Name: Molly [name] MRN: [id number] Date of Birth: 10-19-1945  Date of referral:  01/15/16               Reason for consult:  Facility Placement                Permission sought to share information with:  Facility Medical sales representativeContact Representative, Family Supports Permission granted to share information::  Yes, Verbal Permission Granted  Name::     Molly [name],[place] Spouse   [phone]   Agency::  SNF admissions  Relationship::     Contact Information:     Housing/Transportation Living arrangements for the past 2 months:  Single Family Home Source of Information:  Patient Patient Interpreter Needed:  None Criminal Activity/Legal Involvement Pertinent to Current Situation/Hospitalization:  No - Comment as needed Significant Relationships:  Spouse Lives with:  Spouse Do you feel safe going back to the place where you live?  No Need for family participation in patient care:  No (Coment)  Care giving concerns:  Patient does not feel safe enough to return back home, and she feels she needs short term rehab before she can go home.   Social Worker assessment / plan:  Patient is a 70 year old female who is married and lives with her husband.  Patient is alert and oriented x4 and able to express her feelings.  Patient states she has been to rehab in the past and would like to [facility] [place].  Patient was explained role of MSW and process for bed search.  Patient was also explained how insurance will pay for the stay at SNF.  Patient expressed that she did not have any other questions or concerns.  Employment status:  Retired Database administratornsurance information:  Managed Medicare PT Recommendations:  Skilled Nursing Facility Information / Referral to community resources:  Skilled Nursing Facility  Patient/Family's Response to care:  Patient is in agreement to going to SNF for short term rehab.  Patient/Family's Understanding of and Emotional  Response to Diagnosis, Current Treatment, and Prognosis:  Patient is hopeful that she will be able to make a quick recovery at SNF and return home with her husband.  Emotional Assessment Appearance:  Appears stated age Attitude/Demeanor/Rapport:    Affect (typically observed):    Orientation:  Oriented to Self, Oriented to Place, Oriented to  Time, Oriented to Situation Alcohol / Substance use:  Not Applicable Psych involvement (Current and /or in the community):  No (Comment)  Discharge Needs  Concerns to be addressed:    Readmission within the last 30 days:  No Current discharge risk:    Barriers to Discharge:  Continued Medical Work up, [employer]urance Authorization   Molly [name], Molly [name] 01/16/2016, 3:03 PM

## 2016-01-16 NOTE — Progress Notes (Signed)
Though unable to stand and ambulate with p.t., she is able to feed herself and assist with bathing, ;t;urning and repositioning. Able to use phone to order meals, comb her hair and converse.

## 2016-01-16 NOTE — Clinical Social Work Placement (Signed)
   CLINICAL SOCIAL WORK PLACEMENT  NOTE  Date:  01/16/2016  Patient Details  Name: Molly [name] MRN: [id number] Date of Birth: 1945/08/10  Clinical Social Work is seeking post-discharge placement for this patient at the Skilled  Nursing Facility level of care (*CSW will initial, date and re-position this form in  chart as items are completed):  Yes   Patient/family provided with [facility] Social Work Department's list of facilities offering this level of care within the geographic area requested by the patient (or if unable, by the patient's family).  Yes   Patient/family informed of their freedom to choose among providers that offer the needed level of care, that participate in Medicare, Medicaid or managed care program needed by the patient, have an available bed and are willing to accept the patient.  Yes   Patient/family informed of [facility]'s ownership interest in [facility] and [facility], as well as of the fact that they are under no obligation to receive care at these facilities.  PASRR submitted to EDS on 01/16/16     PASRR number received on       Existing PASRR number confirmed on 01/16/16     FL2 transmitted to all facilities in geographic area requested by pt/family on 01/15/16     FL2 transmitted to all facilities within larger geographic area on       Patient informed that his/her managed care company has contracts with or will negotiate with certain facilities, including the following:        Yes   Patient/family informed of bed offers received.  [facility]     Physician recommends and patient chooses bed at      [facility].  Patient [facility]     Patient family notified on 01/16/16 of transfer.  Name of family member notified:  Patient notified her husband [name],Frio Spouse   [phone]        PHYSICIAN Please sign FL2     Additional Comment:    _______________________________________________ Darleene CleaverAnterhaus, Molly [name] 01/16/2016, 3:10 PM

## 2016-01-16 NOTE — Discharge Instructions (Signed)
Activity as recommended by physical therapy Continue wound care Follow-up with primary care physician at the facility in 2-3 days Follow-up with ENT in a week Instructed not to drive or operate heavy machinery until seen and cleared by ENT

## 2016-01-16 NOTE — Discharge Summary (Addendum)
[facility] [place] [facility]   PATIENT NAME: Molly DustmanBrenda Mabe    MR#:  [id number]  DATE OF BIRTH:  [birth year]  DATE OF ADMISSION:  01/14/2016 ADMITTING PHYSICIAN: Katha HammingSnehalatha Konidena, MD  DATE OF DISCHARGE: 01/16/16 PRIMARY CARE PHYSICIAN: Elita Booneoberts, Caroline C, MD    ADMISSION DIAGNOSIS:  Dizziness [R42] Weakness [R53.1]  DISCHARGE DIAGNOSIS:  Dizziness secondary to vertigo Left foot neuropathic ulcer  SECONDARY DIAGNOSIS:   Past Medical History:  Diagnosis Date  . Anxiety   . COPD (chronic obstructive pulmonary disease) (HCC)   . Diabetes (HCC)   . Hemorrhoid   . HLD (hyperlipidemia)   . HTN (hypertension)   . Stroke [facility](HCC) 2008   left weakness    HOSPITAL COURSE:  1.dizziness secondary to Vertigo;likely benign positional vertigo. on meclizine. Patient's vertigo is improving with meclizine . Outpatient follow-up with ENT is recommended Initial head CT to unremarkable.  No acute findings on MRI of the brain  ultrasound of carotids minimal plaque Echo -55-60% ejection fraction PT evaluation-recommended SNF Outpatient follow-up with ENT is recommended  Instructed patient not to drive or operate heavy machinery until seen and cleared by ENT after discharge    #2 diabetes mellitus type 2: Patient is on high-dose 70/30 insulin 120 units in the morning, 100 units at night as per patient's family. ISS-moderate    #3 history of previous stroke: Patient is on PlaviX  #Frequent falls Bilateral knee x-rays with severe DJD but no fractures PT evaluation -short-term rehabilitation is recommended  #Left  foot skin infection Continue wound care Dressing procedure/placement/frequency:Cleanse wound to left plantar foot with NS and pat gently dry.  Apply Santyl to wound bed. Cover with NS Moist gauze.  Secure with 4x4 gauze and kerlix/tape  Change daily Doxycycline started     # Depression: Continue Elavil, Wellbutrin.  #4.essential  hypertension: Continue losartan.  Generalized weakness with frequent falls: Patient is mostly bedbound snf Follow up with social worker     DISCHARGE CONDITIONS:   fair  CONSULTS OBTAINED:     PROCEDURES None  DRUG ALLERGIES:  No Known Allergies  DISCHARGE MEDICATIONS:   Current Discharge Medication List    START taking these medications   Details  bisacodyl (DULCOLAX) 5 MG EC tablet Take 1 tablet (5 mg total) by mouth daily as needed for moderate constipation. Qty: 30 tablet, Refills: 0    collagenase (SANTYL) ointment Apply topically daily. Qty: 30 g, Refills: 0    doxycycline (VIBRA-TABS) 100 MG tablet Take 1 tablet (100 mg total) by mouth every 12 (twelve) hours. Qty: 14 tablet, Refills: 0    !! insulin aspart (NOVOLOG) 100 UNIT/ML injection Inject 0-15 Units into the skin 3 (three) times daily with meals. Qty: 10 mL, Refills: 11    !! insulin aspart (NOVOLOG) 100 UNIT/ML injection Inject 0-5 Units into the skin at bedtime. Qty: 10 mL, Refills: 11    meclizine (ANTIVERT) 25 MG tablet Take 1 tablet (25 mg total) by mouth 3 (three) times daily. Qty: 30 tablet, Refills: 0     !! - Potential duplicate medications found. Please discuss with provider.    CONTINUE these medications which have NOT CHANGED   Details  amitriptyline (ELAVIL) 50 MG tablet Take 50 mg by mouth daily.    Ascorbic Acid (VITAMIN C) 1000 MG tablet Take 1,000 mg by mouth daily.    aspirin EC 81 MG tablet Take 81 mg by mouth daily.    B Complex-C (SUPER B COMPLEX PO) Take  1 tablet by mouth daily.     Bilberry, Vaccinium myrtillus, (BILBERRY PO) Take 1 capsule by mouth daily.     buPROPion (WELLBUTRIN XL) 300 MG 24 hr tablet Take 300 mg by mouth daily.    calcium carbonate (OS-CAL) 600 MG TABS tablet Take 600 mg by mouth daily.    Cholecalciferol (VITAMIN D3) 1000 UNITS CAPS Take 1,000 Units by mouth daily.    clopidogrel (PLAVIX) 75 MG tablet Take 75 mg by mouth daily.     Coenzyme Q10 (COQ10 PO) Take 1 capsule by mouth daily.     docusate sodium (COLACE) 100 MG capsule Take 100 mg by mouth daily.    GLUCOSAMINE-CHONDROITIN PO Take 1 tablet by mouth 2 (two) times daily.     insulin aspart protamine- aspart (NOVOLOG MIX 70/30) (70-30) 100 UNIT/ML injection Inject 100-120 Units into the skin 2 (two) times daily.     losartan (COZAAR) 50 MG tablet Take 50 mg by mouth daily.    magnesium oxide (MAG-OX) 400 MG tablet Take 400 mg by mouth 2 (two) times daily.    Melatonin 10 MG TBDP Take 10 mg by mouth at bedtime.    metFORMIN (GLUCOPHAGE) 500 MG tablet Take 500 mg by mouth 2 (two) times daily.     Multiple Vitamins-Minerals (OCUVITE PO) Take 1 capsule by mouth daily.     naproxen sodium (ANAPROX) 220 MG tablet Take 220-440 mg by mouth 2 (two) times daily as needed (for pain).     niacin 500 MG tablet Take 500 mg by mouth daily.     Omega-3 Fatty Acids (FISH OIL) 1200 MG CAPS Take 1,200 mg by mouth daily.    VALERIAN PO Take 1 tablet by mouth 2 (two) times daily.     vitamin E 400 UNIT capsule Take 400 Units by mouth daily.         DISCHARGE INSTRUCTIONS:  Activity as recommended by physical therapy Continue wound care Follow-up with primary care physician at the facility in 2-3 days Follow-up with ENT in a week Instructed not to drive or operate heavy machinery until seen and cleared by ENT   DIET:  Diabetic diet,Low-salt  DISCHARGE CONDITION:  Fair  ACTIVITY:  Activity as tolerated,Per PT patient is mostly bedbound at this time  OXYGEN:  Home Oxygen: No.   Oxygen Delivery: room air  DISCHARGE LOCATION:  nursing home   If you experience worsening of your admission symptoms, develop shortness of breath, life threatening emergency, suicidal or homicidal thoughts you must seek medical attention immediately by calling 911 or calling your MD immediately  if symptoms less severe.  You Must read complete instructions/literature along  with all the possible adverse reactions/side effects for all the Medicines you take and that have been prescribed to you. Take any new Medicines after you have completely understood and accpet all the possible adverse reactions/side effects.   Please note  You were cared for by a hospitalist during your hospital stay. If you have any questions about your discharge medications or the care you received while you were in the hospital after you are discharged, you can call the unit and asked to speak with the hospitalist on call if the hospitalist that took care of you is not available. Once you are discharged, your primary care physician will handle any further medical issues. Please note that NO REFILLS for any discharge medications will be authorized once you are discharged, as it is imperative that you return to your primary care physician (  or establish a relationship with a primary care physician if you do not have one) for your aftercare needs so that they can reassess your need for medications and monitor your lab values.     Today  Chief Complaint  Patient presents with  . Dizziness   Patient's vertigo is better with meclizine. Feeling weak and tired  ROS:  CONSTITUTIONAL: Denies fevers, chills. Denies any fatigue, weakness.  EYES: Denies blurry vision, double vision, eye pain. EARS, NOSE, THROAT: Denies tinnitus, ear pain, hearing loss. RESPIRATORY: Denies cough, wheeze, shortness of breath.  CARDIOVASCULAR: Denies chest pain, palpitations, edema.  GASTROINTESTINAL: Denies nausea, vomiting, diarrhea, abdominal pain. Denies bright red blood per rectum. GENITOURINARY: Denies dysuria, hematuria. ENDOCRINE: Denies nocturia or thyroid problems. HEMATOLOGIC AND LYMPHATIC: Denies easy bruising or bleeding. SKIN: Left foot redness, follows up with wound care MUSCULOSKELETAL: Patient is mostly bedbound for more than 3 months NEUROLOGIC: Reporting room spinning around her Denies paralysis,  paresthesias.  PSYCHIATRIC: Denies anxiety or depressive symptoms.   VITAL SIGNS:  Blood pressure (!) 152/78, pulse 95, temperature 97.6 F (36.4 C), temperature source Oral, resp. rate 18, height 5\' 6"  (1.676 m), weight (!) 139 kg (306 lb 6.4 oz), SpO2 95 %.  I/O:    Intake/Output Summary (Last 24 hours) at 01/16/16 1241 Last data filed at 01/16/16 1001  Gross per 24 hour  Intake              630 ml  Output                0 ml  Net              630 ml    PHYSICAL EXAMINATION:  GENERAL:  70 y.o.-year-old patient lying in the bed with no acute distress.  EYES: Pupils equal, round, reactive to light and accommodation. No scleral icterus. Extraocular muscles intact.  HEENT: Head atraumatic, normocephalic. Oropharynx and nasopharynx clear.  NECK:  Supple, no jugular venous distention. No thyroid enlargement, no tenderness.  LUNGS: Normal breath sounds bilaterally, no wheezing, rales,rhonchi or crepitation. No use of accessory muscles of respiration.  CARDIOVASCULAR: S1, S2 normal. No murmurs, rubs, or gallops.  ABDOMEN: Soft, non-tender, non-distended. Bowel sounds present. No organomegaly or mass.  EXTREMITIES: Left foot erythematous area located and discharge ,No pedal edema, cyanosis, or clubbing.  NEUROLOGIC: Cranial nerves II through XII are intact. Muscle strength at her baseline gait not checked. Patient is bedbound  PSYCHIATRIC: The patient is alert and oriented x 3.  SKIN: No rash  DATA REVIEW:   CBC  Recent Labs Lab 01/15/16 0510  WBC 10.3  HGB 12.4  HCT 37.9  PLT 312    Chemistries   Recent Labs Lab 01/14/16 1011 01/15/16 0510  NA 137 139  K 4.6 4.8  CL 98* 101  CO2 30 31  GLUCOSE 139* 194*  BUN 21* 16  CREATININE 0.97 0.96  CALCIUM 9.5 9.3  AST 25  --   ALT 40  --   ALKPHOS 71  --   BILITOT 0.6  --     Cardiac Enzymes  Recent Labs Lab 01/14/16 1011  TROPONINI <0.03    Microbiology Results  Results for orders placed or performed during  the hospital encounter of 11/19/14  Culture, blood (routine x 2)     Status: None   Collection Time: 11/19/14  6:09 PM  Result Value Ref Range Status   Specimen Description BLOOD LEFT ARM  Final   Special Requests BOTTLES DRAWN AEROBIC  AND ANAEROBIC 8CCAER 5CC ANA  Final   Culture NO GROWTH 5 DAYS  Final   Report Status 11/24/2014 FINAL  Final  Culture, blood (routine x 2)     Status: None   Collection Time: 11/19/14  6:11 PM  Result Value Ref Range Status   Specimen Description BLOOD RIGHT HAND  Final   Special Requests BOTTLES DRAWN AEROBIC AND ANAEROBIC 8CC  Final   Culture NO GROWTH 5 DAYS  Final   Report Status 11/24/2014 FINAL  Final    RADIOLOGY:  Dg Knee 1-2 Views Left  Result Date: 01/15/2016 CLINICAL DATA:  Fall. EXAM: LEFT KNEE - 1-2 VIEW COMPARISON:  None. FINDINGS: No evidence of fracture, dislocation, or joint effusion. Severe narrowing of lateral joint space is noted with osteophyte formation. Moderate narrowing of patellofemoral space is noted. Soft tissues are unremarkable. 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 No acute abnormality seen in the left knee. Electronically Signed   By: Lupita Raider, M.D.   On: 01/15/2016 14:41   Ct Head Wo Contrast  Result Date: 01/14/2016 CLINICAL DATA:  Dizziness for 1 day, recent falls EXAM: CT HEAD WITHOUT CONTRAST TECHNIQUE: Contiguous axial images were obtained from the base of the skull through the vertex without intravenous contrast. COMPARISON:  CT brain scan of 07/31/2015 FINDINGS: Brain: The ventricular system is minimally prominent, with moderately severe small vessel ischemic changes noted throughout the periventricular white matter. The septum is midline in position. The fourth ventricle and basilar cisterns are unremarkable. No hemorrhage, mass lesion, or acute infarction is seen. Vascular: There is dense calcification of the right vertebral artery. Skull: No acute calvarial abnormality is seen.  Sinuses/Orbits: The paranasal sinuses are pneumatized. Other: None IMPRESSION: Moderately severe small vessel ischemic change throughout the periventricular white matter for age. No acute intracranial abnormality. Electronically Signed   By: Dwyane Dee M.D.   On: 01/14/2016 10:35   Mr Brain Wo Contrast  Result Date: 01/14/2016 CLINICAL DATA:  70 year old diabetic hypertensive female with dizziness since yesterday. Subsequent encounter. EXAM: MRI HEAD WITHOUT CONTRAST TECHNIQUE: Multiplanar, multiecho pulse sequences of the brain and surrounding structures were obtained without intravenous contrast. COMPARISON:  01/14/2016 CT.  10/18/2006 brain MR. FINDINGS: Brain: No acute infarct or intracranial hemorrhage. Remote infarct posterior limb right internal capsule and medial right temporal lobe. Wallerian degeneration with small right cerebral peduncle. Remote small left cerebellar infarct. Marked chronic microvascular changes. Global atrophy without hydrocephalus. No intracranial mass lesion noted on this unenhanced exam. Hyperostosis frontalis interna an incidentally noted. Vascular: Major intracranial vascular structures are patent. Skull and upper cervical spine: Mild degenerative changes upper cervical spine. Sinuses/Orbits: Post lens replacement. Exophthalmos. Minimal mucosal thickening frontal sinuses and ethmoid sinus air cells. Minimal partial opacification mastoid air cells more notable on the right. Other: Negative IMPRESSION: Motion degraded examination. No acute infarct or intracranial hemorrhage. Remote infarct posterior limb right internal capsule and medial right temporal lobe. Wallerian degeneration with small right cerebral peduncle. Remote small left cerebellar infarct. Marked chronic microvascular changes. Global atrophy. Electronically Signed   By: Lacy Duverney M.D.   On: 01/14/2016 15:30   US Carotid Bilateral  Result Date: 01/15/2016 CLINICAL DATA:  Dizziness for the past 3 days.  History of stroke / TIA and syncopal episode. History of hypertension and diabetes. EXAM: BILATERAL CAROTID DUPLEX ULTRASOUND TECHNIQUE: Wallace Cullens scale imaging, color Doppler and duplex ultrasound were performed of bilateral carotid and vertebral arteries in the neck. COMPARISON:  None. FINDINGS: Criteria: Quantification of carotid  stenosis is based on velocity parameters that correlate the residual internal carotid diameter with NASCET-based stenosis levels, using the diameter of the distal internal carotid lumen as the denominator for stenosis measurement. The following velocity measurements were obtained: RIGHT ICA:  59/16 cm/sec CCA:  95/14 cm/sec SYSTOLIC ICA/CCA RATIO:  0.6 DIASTOLIC ICA/CCA RATIO:  1.2 ECA:  79 cm/sec LEFT ICA:  55/10 cm/sec CCA:  121/7 cm/sec SYSTOLIC ICA/CCA RATIO:  0.5 DIASTOLIC ICA/CCA RATIO:  1.4 ECA:  58 cm/sec RIGHT CAROTID ARTERY: There is no grayscale evidence of significant intimal thickening or atherosclerotic plaque affecting interrogated portions of the right carotid system. There are no elevated peak systolic velocities within the interrogated course the right internal carotid artery to suggest a hemodynamically significant stenosis. RIGHT VERTEBRAL ARTERY:  Antegrade Flow LEFT CAROTID ARTERY: The left common carotid artery is noted to be tortuous (image 35). There is a minimal amount of eccentric mixed echogenic plaque within the left carotid bulb (image 49 46 and 47), not resulting in elevated peak systolic velocities within the interrogated course the left internal carotid artery to suggest a hemodynamically significant stenosis. LEFT VERTEBRAL ARTERY:  Antegrade Flow IMPRESSION: 1. Minimal amount of left-sided atherosclerotic plaque, not resulting in a hemodynamically significant stenosis. 2. Unremarkable sonographic evaluation of the right carotid system. Electronically Signed   By: Simonne Come M.D.   On: 01/15/2016 15:32   Dg Knee 2 Views Right  Result Date:  01/15/2016 CLINICAL DATA:  70 year old female with knee pain. Initial encounter. Diabetes. Previous lower extremity amputation. EXAM: RIGHT KNEE - 3 VIEW COMPARISON:  Right knee series 5317 FINDINGS: Severe tricompartmental degenerative spurring. Severe patellofemoral joint space loss. Moderate medial greater than lateral compartment joint space loss. Chronic dystrophic calcification in the lateral distal quadriceps. Chronic dystrophic calcification about the medial collateral ligament attachment on the proximal tibia. No definite joint effusion. No acute osseous abnormality identified. Calcified peripheral vascular disease. IMPRESSION: 1. Severe tricompartmental degenerative changes, probably worst in the patellofemoral compartment. No acute osseous abnormality identified. 2. Calcified peripheral vascular disease. 3. 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 Electronically Signed   By: Odessa Fleming M.D.   On: 01/15/2016 14:42    EKG:   Orders placed or performed during the hospital encounter of 01/14/16  . ED EKG  . ED EKG  . EKG 12-Lead  . EKG 12-Lead      Management plans discussed with the patient, family and they are in agreement.  CODE STATUS:     Code Status Orders        Start     Ordered   01/14/16 1358  Full code  Continuous     01/14/16 1401    Code Status History    Date Active Date Inactive Code Status Order ID Comments User Context   11/19/2014 10:40 PM 11/21/2014  8:55 PM Full Code [id number]  Oralia Manis, MD Inpatient   05/22/2014  6:31 PM 05/25/2014  8:20 PM Full Code [id number]  Toni Arthurs, MD Inpatient   05/21/2014 10:31 PM 05/22/2014  6:31 PM Full Code [id number]  Yevonne Pax, MD Inpatient      TOTAL TIME TAKING CARE OF THIS PATIENT: 45  minutes.   Note: This dictation was prepared with Dragon dictation along with smaller phrase technology. Any transcriptional errors that result from this process are  unintentional.   @[facility] @  on 01/16/2016 at 12:41 PM  Between 7am to 6pm - Pager - [phone]  After 6pm go to  [email or web address] - password [email or web address] [facility]  [facility]  Office  [phone]  CC: Primary care physician; Elita Boone, MD

## 2016-01-16 NOTE — Care Management (Signed)
Case discussed with Barbara CowerJason with PT. He is recommending SNF. CSW updated. Discharge pending insurance auth. For SNF.

## 2016-01-16 NOTE — Progress Notes (Signed)
Pt to be discharged to [facility] this evening. Report called to gloria at the facility. Iv and tele removed. To be transported via e.m.s.

## 2016-01-24 ENCOUNTER — Ambulatory Visit: Payer: Medicare HMO | Admitting: Podiatry

## 2016-02-01 ENCOUNTER — Emergency Department: Payer: Medicare HMO

## 2016-02-01 ENCOUNTER — Inpatient Hospital Stay
Admission: EM | Admit: 2016-02-01 | Discharge: 2016-02-05 | DRG: 064 | Disposition: A | Discharge status: Skilled Nursing Facility | Payer: Medicare HMO | Attending: Internal Medicine | Admitting: Internal Medicine

## 2016-02-01 DIAGNOSIS — Z87891 Personal history of nicotine dependence: Secondary | ICD-10-CM

## 2016-02-01 DIAGNOSIS — J449 Chronic obstructive pulmonary disease, unspecified: Secondary | ICD-10-CM | POA: Diagnosis present

## 2016-02-01 DIAGNOSIS — I2510 Atherosclerotic heart disease of native coronary artery without angina pectoris: Secondary | ICD-10-CM | POA: Diagnosis present

## 2016-02-01 DIAGNOSIS — Z794 Long term (current) use of insulin: Secondary | ICD-10-CM | POA: Diagnosis not present

## 2016-02-01 DIAGNOSIS — Z7982 Long term (current) use of aspirin: Secondary | ICD-10-CM

## 2016-02-01 DIAGNOSIS — Z8673 Personal history of transient ischemic attack (TIA), and cerebral infarction without residual deficits: Secondary | ICD-10-CM | POA: Diagnosis not present

## 2016-02-01 DIAGNOSIS — I10 Essential (primary) hypertension: Secondary | ICD-10-CM | POA: Diagnosis present

## 2016-02-01 DIAGNOSIS — G9340 Encephalopathy, unspecified: Secondary | ICD-10-CM | POA: Diagnosis present

## 2016-02-01 DIAGNOSIS — Z89422 Acquired absence of other left toe(s): Secondary | ICD-10-CM

## 2016-02-01 DIAGNOSIS — E1151 Type 2 diabetes mellitus with diabetic peripheral angiopathy without gangrene: Secondary | ICD-10-CM | POA: Diagnosis present

## 2016-02-01 DIAGNOSIS — I639 Cerebral infarction, unspecified: Secondary | ICD-10-CM | POA: Diagnosis present

## 2016-02-01 DIAGNOSIS — Z823 Family history of stroke: Secondary | ICD-10-CM | POA: Diagnosis not present

## 2016-02-01 DIAGNOSIS — K219 Gastro-esophageal reflux disease without esophagitis: Secondary | ICD-10-CM | POA: Diagnosis present

## 2016-02-01 DIAGNOSIS — E11610 Type 2 diabetes mellitus with diabetic neuropathic arthropathy: Secondary | ICD-10-CM | POA: Diagnosis present

## 2016-02-01 DIAGNOSIS — Z9849 Cataract extraction status, unspecified eye: Secondary | ICD-10-CM | POA: Diagnosis not present

## 2016-02-01 DIAGNOSIS — Z7902 Long term (current) use of antithrombotics/antiplatelets: Secondary | ICD-10-CM

## 2016-02-01 DIAGNOSIS — M6281 Muscle weakness (generalized): Secondary | ICD-10-CM

## 2016-02-01 DIAGNOSIS — Z79899 Other long term (current) drug therapy: Secondary | ICD-10-CM | POA: Diagnosis not present

## 2016-02-01 DIAGNOSIS — R4182 Altered mental status, unspecified: Secondary | ICD-10-CM

## 2016-02-01 DIAGNOSIS — R29709 NIHSS score 9: Secondary | ICD-10-CM | POA: Diagnosis present

## 2016-02-01 DIAGNOSIS — E785 Hyperlipidemia, unspecified: Secondary | ICD-10-CM | POA: Diagnosis present

## 2016-02-01 LAB — URINE DRUG SCREEN, QUALITATIVE (ARMC ONLY)
Amphetamines, Ur Screen: NOT DETECTED
BARBITURATES, UR SCREEN: NOT DETECTED
BENZODIAZEPINE, UR SCRN: NOT DETECTED
CANNABINOID 50 NG, UR ~~LOC~~: NOT DETECTED
COCAINE METABOLITE, UR ~~LOC~~: NOT DETECTED
MDMA (Ecstasy)Ur Screen: NOT DETECTED
Methadone Scn, Ur: NOT DETECTED
OPIATE, UR SCREEN: NOT DETECTED
Phencyclidine (PCP) Ur S: NOT DETECTED
Tricyclic, Ur Screen: POSITIVE — AB

## 2016-02-01 LAB — COMPREHENSIVE METABOLIC PANEL
ALT: 42 U/L (ref 14–54)
AST: 36 U/L (ref 15–41)
Albumin: 3.8 g/dL (ref 3.5–5.0)
Alkaline Phosphatase: 66 U/L (ref 38–126)
Anion gap: 9 (ref 5–15)
BILIRUBIN TOTAL: 0.9 mg/dL (ref 0.3–1.2)
BUN: 24 mg/dL — ABNORMAL HIGH (ref 6–20)
CHLORIDE: 98 mmol/L — AB (ref 101–111)
CO2: 30 mmol/L (ref 22–32)
CREATININE: 0.93 mg/dL (ref 0.44–1.00)
Calcium: 10.8 mg/dL — ABNORMAL HIGH (ref 8.9–10.3)
Glucose, Bld: 76 mg/dL (ref 65–99)
POTASSIUM: 4.6 mmol/L (ref 3.5–5.1)
Sodium: 137 mmol/L (ref 135–145)
TOTAL PROTEIN: 7.7 g/dL (ref 6.5–8.1)

## 2016-02-01 LAB — GLUCOSE, CAPILLARY
Glucose-Capillary: 79 mg/dL (ref 65–99)
Glucose-Capillary: 133 mg/dL — ABNORMAL HIGH (ref 65–99)

## 2016-02-01 LAB — CBC
HEMATOCRIT: 42.6 % (ref 35.0–47.0)
Hemoglobin: 14.3 g/dL (ref 12.0–16.0)
MCH: 31.5 pg (ref 26.0–34.0)
MCHC: 33.5 g/dL (ref 32.0–36.0)
MCV: 93.9 fL (ref 80.0–100.0)
PLATELETS: 367 10*3/uL (ref 150–440)
RBC: 4.53 MIL/uL (ref 3.80–5.20)
RDW: 14.5 % (ref 11.5–14.5)
WBC: 12.6 10*3/uL — AB (ref 3.6–11.0)

## 2016-02-01 LAB — URINALYSIS COMPLETE WITH MICROSCOPIC (ARMC ONLY)
BACTERIA UA: NONE SEEN
BILIRUBIN URINE: NEGATIVE
GLUCOSE, UA: NEGATIVE mg/dL
Hgb urine dipstick: NEGATIVE
KETONES UR: NEGATIVE mg/dL
LEUKOCYTES UA: NEGATIVE
NITRITE: NEGATIVE
PROTEIN: 100 mg/dL — AB
SPECIFIC GRAVITY, URINE: 1.017 (ref 1.005–1.030)
pH: 5 (ref 5.0–8.0)

## 2016-02-01 LAB — MRSA PCR SCREENING: MRSA by PCR: NEGATIVE

## 2016-02-01 LAB — PROTIME-INR
INR: 0.94
PROTHROMBIN TIME: 12.6 s (ref 11.4–15.2)

## 2016-02-01 LAB — TROPONIN I

## 2016-02-01 LAB — ETHANOL

## 2016-02-01 MED ORDER — VALERIAN 450 MG PO CAPS: 450.0000 mg | ORAL_CAPSULE | Freq: Every day | ORAL | Status: DC | PRN

## 2016-02-01 MED ORDER — COLLAGENASE 250 UNIT/GM EX OINT: TOPICAL_OINTMENT | Freq: Every day | CUTANEOUS | Status: DC

## 2016-02-01 MED ORDER — SENNA 8.6 MG PO TABS
1.0000 | ORAL_TABLET | Freq: Two times a day (BID) | ORAL | Status: DC
  Administered 2016-02-01 – 2016-02-05 (×8): 8.6 mg via ORAL
  Filled 2016-02-01 (×8): qty 1

## 2016-02-01 MED ORDER — VITAMIN C 500 MG PO TABS
1000.0000 mg | ORAL_TABLET | Freq: Every day | ORAL | Status: DC
  Administered 2016-02-02 – 2016-02-05 (×4): 1000 mg via ORAL
  Filled 2016-02-01 (×4): qty 2

## 2016-02-01 MED ORDER — BUPROPION HCL ER (XL) 300 MG PO TB24
300.0000 mg | ORAL_TABLET | Freq: Every evening | ORAL | Status: DC
  Administered 2016-02-01 – 2016-02-04 (×4): 300 mg via ORAL
  Filled 2016-02-01 (×4): qty 1

## 2016-02-01 MED ORDER — INSULIN ASPART 100 UNIT/ML ~~LOC~~ SOLN
0.0000 [IU] | Freq: Three times a day (TID) | SUBCUTANEOUS | Status: DC
  Administered 2016-02-02: 12:00:00 2 [IU] via SUBCUTANEOUS
  Administered 2016-02-02: 17:00:00 1 [IU] via SUBCUTANEOUS
  Filled 2016-02-01: qty 2
  Filled 2016-02-01: qty 1

## 2016-02-01 MED ORDER — LOSARTAN POTASSIUM 25 MG PO TABS
50.0000 mg | ORAL_TABLET | Freq: Every day | ORAL | Status: DC
  Administered 2016-02-02 – 2016-02-05 (×4): 50 mg via ORAL
  Filled 2016-02-01 (×4): qty 2

## 2016-02-01 MED ORDER — CLOPIDOGREL BISULFATE 75 MG PO TABS
75.0000 mg | ORAL_TABLET | Freq: Every day | ORAL | Status: DC
  Administered 2016-02-02 – 2016-02-05 (×4): 75 mg via ORAL
  Filled 2016-02-01 (×4): qty 1

## 2016-02-01 MED ORDER — ENOXAPARIN SODIUM 40 MG/0.4ML ~~LOC~~ SOLN
40.0000 mg | Freq: Two times a day (BID) | SUBCUTANEOUS | Status: DC
  Administered 2016-02-01 – 2016-02-05 (×8): 40 mg via SUBCUTANEOUS
  Filled 2016-02-01 (×8): qty 0.4

## 2016-02-01 MED ORDER — NIACIN 500 MG PO TABS
500.0000 mg | ORAL_TABLET | Freq: Every day | ORAL | Status: DC
  Administered 2016-02-02 – 2016-02-05 (×4): 500 mg via ORAL
  Filled 2016-02-01 (×5): qty 1

## 2016-02-01 MED ORDER — OMEGA-3-ACID ETHYL ESTERS 1 G PO CAPS
2.0000 g | ORAL_CAPSULE | Freq: Every day | ORAL | Status: DC
  Administered 2016-02-02 – 2016-02-05 (×4): 2 g via ORAL
  Filled 2016-02-01 (×4): qty 2

## 2016-02-01 MED ORDER — HALOPERIDOL LACTATE 5 MG/ML IJ SOLN
2.5000 mg | Freq: Once | INTRAMUSCULAR | Status: AC
  Administered 2016-02-01: 2.5 mg via INTRAVENOUS
  Filled 2016-02-01: qty 1

## 2016-02-01 MED ORDER — POLYETHYLENE GLYCOL 3350 17 G PO PACK
17.0000 g | PACK | Freq: Every day | ORAL | Status: DC
  Administered 2016-02-02 – 2016-02-05 (×4): 17 g via ORAL
  Filled 2016-02-01 (×4): qty 1

## 2016-02-01 MED ORDER — BISACODYL 5 MG PO TBEC: 5.0000 mg | DELAYED_RELEASE_TABLET | Freq: Every day | ORAL | Status: DC | PRN

## 2016-02-01 MED ORDER — VITAMIN E 180 MG (400 UNIT) PO CAPS
400.0000 [IU] | ORAL_CAPSULE | Freq: Every day | ORAL | Status: DC
  Administered 2016-02-02 – 2016-02-05 (×4): 400 [IU] via ORAL
  Filled 2016-02-01 (×4): qty 1

## 2016-02-01 MED ORDER — CALCIUM CARBONATE ANTACID 500 MG PO CHEW
500.0000 mg | CHEWABLE_TABLET | Freq: Three times a day (TID) | ORAL | Status: DC
  Administered 2016-02-02 – 2016-02-05 (×10): 500 mg via ORAL
  Filled 2016-02-01 (×10): qty 1

## 2016-02-01 MED ORDER — HALOPERIDOL LACTATE 5 MG/ML IJ SOLN
INTRAMUSCULAR | Status: AC
  Filled 2016-02-01: qty 1

## 2016-02-01 MED ORDER — INSULIN ASPART 100 UNIT/ML ~~LOC~~ SOLN: 0.0000 [IU] | Freq: Every day | SUBCUTANEOUS | Status: DC

## 2016-02-01 MED ORDER — NAPROXEN 250 MG PO TABS: 250.0000 mg | ORAL_TABLET | Freq: Two times a day (BID) | ORAL | Status: DC | PRN

## 2016-02-01 MED ORDER — STROKE: EARLY STAGES OF RECOVERY BOOK
Freq: Once | Status: AC
  Administered 2016-02-01: 21:00:00

## 2016-02-01 MED ORDER — ASPIRIN EC 81 MG PO TBEC
81.0000 mg | DELAYED_RELEASE_TABLET | Freq: Every day | ORAL | Status: DC
  Administered 2016-02-02 – 2016-02-05 (×4): 81 mg via ORAL
  Filled 2016-02-01 (×4): qty 1

## 2016-02-01 MED ORDER — MECLIZINE HCL 12.5 MG PO TABS
25.0000 mg | ORAL_TABLET | Freq: Three times a day (TID) | ORAL | Status: DC
  Administered 2016-02-01 – 2016-02-05 (×11): 25 mg via ORAL
  Filled 2016-02-01 (×11): qty 2

## 2016-02-01 NOTE — Progress Notes (Signed)
PHARMACIST - PHYSICIAN COMMUNICATION  CONCERNING:  Enoxaparin (Lovenox) for DVT Prophylaxis   RECOMMENDATION: Patient was prescribed enoxaprin 40mg  q24 hours for VTE prophylaxis.   Filed Weights   02/01/16 1303  Weight: 300 lb (136.1 kg)   Body mass index is 48.42 kg/m.  Estimated Creatinine Clearance: 80 mL/min (by C-G formula based on SCr of 0.93 mg/dL).  Based on [facility] policy patient is candidate for enoxaparin 40mg  every 12 hour dosing due to BMI being >40.  DESCRIPTION: Pharmacy has adjusted enoxaparin dose per [facility] policy.  Patient is now receiving enoxaparin 40mg  every 12 hours.   Molly [name], PharmD Clinical Pharmacist  02/01/2016 9:25 PM

## 2016-02-01 NOTE — ED Provider Notes (Addendum)
[facility] Emergency Department Provider Note  ____________________________________________   First MD Initiated Contact with Patient 02/01/16 1419     (approximate)  I have reviewed the triage vital signs and the nursing notes.   HISTORY  Chief Complaint Altered Mental Status    HPI Molly [name] is a 70 y.o. female who presents from [facility] by EMS for evaluation of altered mental status.  According to EMS, the facility noticed that the patient was slow to respond than usual and were concerned about the possibility of the patient having an acute stroke because the symptoms were similar to a large prior stroke.  She was last seen normal last night.  Her husband describes that she was seated on the side of the bed and seemed to be staring off and not making much sense when she was talking.  However all the symptoms have completely resolved at this time and she seems back to her baseline.  After her large prior stroke she has no use of her left arm and significantly decreased use of her left lower extremity.  She currently has no numbness or weakness in her right extremities.  She has no facial droop and no difficulty speaking at this time.  She denies fever/chills, chest pain, shortness of breath, nausea, vomiting, diarrhea, dysuria, abdominal pain.   Past Medical History:  Diagnosis Date  . Anxiety   . COPD (chronic obstructive pulmonary disease) (HCC)   . Diabetes (HCC)   . Hemorrhoid   . HLD (hyperlipidemia)   . HTN (hypertension)   . Stroke [facility]) 2008   left weakness    Patient Active Problem List   Diagnosis Date Noted  . 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 Pressure injury of skin 01/15/2016  . Dizziness 01/14/2016  . Cellulitis of left lower extremity 11/19/2014  . COPD (chronic obstructive pulmonary disease) (HCC) 11/19/2014  . HTN (hypertension) 11/19/2014  . HLD (hyperlipidemia) 11/19/2014  . Sepsis (HCC) 11/19/2014  . Cellulitis of  left foot 05/21/2014  . Diabetes mellitus (HCC) 05/21/2014  . Chronic anemia 05/21/2014  . Osteomyelitis of left foot (HCC) 05/21/2014  . Foot ulcer (HCC) 02/04/2014    Past Surgical History:  Procedure Laterality Date  . AMPUTATION Left 05/22/2014   Procedure: AMPUTATION RAY LEFT FOURTH AND FIFTH ;  Surgeon: Toni Arthurs, MD;  Location: [facility] OR;  Service: Orthopedics;  Laterality: Left;  . CATARACT EXTRACTION    . COLONOSCOPY  2004    Prior to Admission medications   Medication Sig Start Date End Date Taking? Authorizing Provider  amitriptyline (ELAVIL) 50 MG tablet Take 50 mg by mouth daily.   Yes Historical Provider, MD  Ascorbic Acid (VITAMIN C) 1000 MG tablet Take 1,000 mg by mouth daily.   Yes Historical Provider, MD  aspirin EC 81 MG tablet Take 81 mg by mouth daily.   Yes Historical Provider, MD  B Complex-C (SUPER B COMPLEX PO) Take 1 tablet by mouth daily.    Yes Historical Provider, MD  Bilberry, Vaccinium myrtillus, (BILBERRY PO) Take 1 capsule by mouth daily. 160mg    Yes Historical Provider, MD  buPROPion (WELLBUTRIN XL) 300 MG 24 hr tablet Take 300 mg by mouth daily.   Yes Historical Provider, MD  calcium carbonate (OS-CAL) 600 MG TABS tablet Take 600 mg by mouth daily.   Yes Historical Provider, MD  Cholecalciferol (VITAMIN D3) 1000 UNITS CAPS Take 1,000 Units by mouth daily.   Yes Historical Provider, MD  clopidogrel (PLAVIX) 75  MG tablet Take 75 mg by mouth daily.   Yes Historical Provider, MD  Coenzyme Q10 (COQ10 PO) Take 1 capsule by mouth daily.    Yes Historical Provider, MD  docusate sodium (COLACE) 100 MG capsule Take 100 mg by mouth daily.   Yes Historical Provider, MD  GLUCOSAMINE-CHONDROITIN PO Take 1 tablet by mouth 2 (two) times daily.    Yes Historical Provider, MD  insulin aspart protamine- aspart (NOVOLOG MIX 70/30) (70-30) 100 UNIT/ML injection Inject 100-120 Units into the skin 2 (two) times daily.    Yes Historical Provider, MD  insulin lispro (HUMALOG) 100  UNIT/ML injection Inject into the skin once. Sliding scale 0-99=0 units 100-149=2 units 150-199=3 units 200-249=4 units 250-299=5 units 300-349=6 units 350-399= 7 units 400-449=8 units   Yes Historical Provider, MD  losartan (COZAAR) 50 MG tablet Take 50 mg by mouth daily.   Yes Historical Provider, MD  magnesium oxide (MAG-OX) 400 MG tablet Take 400 mg by mouth 2 (two) times daily.   Yes Historical Provider, MD  meclizine (ANTIVERT) 25 MG tablet Take 1 tablet (25 mg total) by mouth 3 (three) times daily. 01/16/16  Yes Ramonita Lab, MD  Melatonin 10 MG TBDP Take 10 mg by mouth at bedtime.   Yes Historical Provider, MD  metFORMIN (GLUCOPHAGE) 500 MG tablet Take 500 mg by mouth 2 (two) times daily.    Yes Historical Provider, MD  Multiple Vitamins-Minerals (OCUVITE PO) Take 1 capsule by mouth daily.    Yes Historical Provider, MD  niacin 500 MG tablet Take 500 mg by mouth daily.    Yes Historical Provider, MD  Omega-3 Fatty Acids (FISH OIL) 1000 MG CPDR Take 1,200 mg by mouth daily.   Yes Historical Provider, MD  polyethylene glycol (MIRALAX / GLYCOLAX) packet Take 17 g by mouth daily.   Yes Historical Provider, MD  senna (SENOKOT) 8.6 MG TABS tablet Take 1 tablet by mouth 2 (two) times daily.   Yes Historical Provider, MD  Valerian 450 MG CAPS Take 1 tablet by mouth daily as needed (insomnia).    Yes Historical Provider, MD  vitamin E 400 UNIT capsule Take 400 Units by mouth daily.   Yes Historical Provider, MD  bisacodyl (DULCOLAX) 5 MG EC tablet Take 1 tablet (5 mg total) by mouth daily as needed for moderate constipation. Patient not taking: Reported on 02/01/2016 01/16/16   Ramonita Lab, MD  collagenase (SANTYL) ointment Apply topically daily. Patient not taking: Reported on 02/01/2016 01/17/16   Ramonita Lab, MD  doxycycline (VIBRA-TABS) 100 MG tablet Take 1 tablet (100 mg total) by mouth every 12 (twelve) hours. Patient not taking: Reported on 02/01/2016 01/16/16   Ramonita Lab, MD  insulin  aspart (NOVOLOG) 100 UNIT/ML injection Inject 0-15 Units into the skin 3 (three) times daily with meals. Patient not taking: Reported on 02/01/2016 01/16/16   Ramonita Lab, MD  insulin aspart (NOVOLOG) 100 UNIT/ML injection Inject 0-5 Units into the skin at bedtime. Patient not taking: Reported on 02/01/2016 01/16/16   Ramonita Lab, MD  naproxen sodium (ANAPROX) 220 MG tablet Take 220-440 mg by mouth 2 (two) times daily as needed (for pain).     Historical Provider, MD    Allergies Review of patient's allergies indicates no known allergies.  Family History  Problem Relation Age of Onset  . Hyperlipidemia Mother   . Varicose Veins Mother   . Deep vein thrombosis Father   . Stroke Father   . Breast cancer Paternal Aunt   . Stroke  Maternal Grandmother   . Stroke Paternal Grandmother     Social History Social History  Substance Use Topics  . Smoking status: Former Smoker    Packs/day: 0.50    Years: 2.00    Types: Cigarettes    Quit date: 11/18/1968  . Smokeless tobacco: Never Used  . Alcohol use No     Comment: seldom    Review of Systems Constitutional: No fever/chills Eyes: No visual changes. ENT: No sore throat. Cardiovascular: Denies chest pain. Respiratory: Denies shortness of breath. Gastrointestinal: No abdominal pain.  No nausea, no vomiting.  No diarrhea.  No constipation. Genitourinary: Negative for dysuria. Musculoskeletal: Negative for back pain. Skin: Negative for rash. Neurological: Altered mental status previously, now resolved.  Chronic left-sided deficits with no use of left upper extremity and decreased use of left lower extremity  10-point ROS otherwise negative.  ____________________________________________   PHYSICAL EXAM:  VITAL SIGNS: ED Triage Vitals  Enc Vitals Group     BP 02/01/16 1302 (!) 141/66     Pulse Rate 02/01/16 1302 89     Resp 02/01/16 1302 20     Temp 02/01/16 1302 97.4 F (36.3 C)     Temp Source 02/01/16 1302 Oral     SpO2  02/01/16 1302 96 %     Weight 02/01/16 1303 300 lb (136.1 kg)     Height 02/01/16 1303 5\' 6"  (1.676 m)     Head Circumference --      Peak Flow --      Pain Score --      Pain Loc --      Pain Edu? --      Excl. in GC? --     Constitutional: Alert and oriented. Sequela of chronic illness but no acute distress at this time Eyes: Conjunctivae are normal. PERRL. EOMI. Head: Atraumatic. Nose: No congestion/rhinnorhea. Mouth/Throat: Mucous membranes are moist.  Oropharynx non-erythematous. Neck: No stridor.  No meningeal signs.   Cardiovascular: Normal rate, regular rhythm. Good peripheral circulation. Grossly normal heart sounds. Respiratory: Normal respiratory effort.  No retractions. Lungs CTAB. Gastrointestinal: Morbid obesity.  Soft and nontender. No distention.  Musculoskeletal: No lower extremity tenderness nor edema. No gross deformities of extremities. Neurologic:  Normal speech and language.  No acute radial nerve deficits.  The patient has good grip strength and muscle strength in her right upper and lower extremities.  She has no use of her left upper extremity but is able to flex and extend her left lower extremity at baseline Skin:  Skin is warm, dry and intact. No rash noted. Psychiatric: Mood and affect are normal. Speech and behavior are normal.  ____________________________________________   LABS (all labs ordered are listed, but only abnormal results are displayed)  Labs Reviewed  COMPREHENSIVE METABOLIC PANEL - Abnormal; Notable for the following:       Result Value   Chloride 98 (*)    BUN 24 (*)    Calcium 10.8 (*)    All other components within normal limits  CBC - Abnormal; Notable for the following:    WBC 12.6 (*)    All other components within normal limits  URINALYSIS COMPLETEWITH MICROSCOPIC ([facility] ONLY) - Abnormal; Notable for the following:    Color, Urine AMBER (*)    APPearance HAZY (*)    Protein, ur 100 (*)    Squamous Epithelial / LPF 0-5 (*)     All other components within normal limits  URINE DRUG SCREEN, QUALITATIVE ([facility] ONLY) - Abnormal;  Notable for the following:    Tricyclic, Ur Screen POSITIVE (*)    All other components within normal limits  GLUCOSE, CAPILLARY  ETHANOL  TROPONIN I  PROTIME-INR  AMMONIA  BLOOD GAS, ARTERIAL  CBG MONITORING, ED   ____________________________________________  EKG  ED ECG REPORT I, Cierrah Dace, the attending physician, personally viewed and interpreted this ECG.  Date: 02/01/2016 EKG Time: 19:46 Rate: 97 Rhythm: A bundle branch block QRS Axis: normal Intervals: normal ST/T Wave abnormalities: Non-specific ST segment / T-wave changes, but no evidence of acute ischemia. Conduction Disturbances: none Narrative Interpretation: unremarkable  ____________________________________________  RADIOLOGY   Ct Head Wo Contrast  Result Date: 02/01/2016 CLINICAL DATA:  Altered mental status. EXAM: CT HEAD WITHOUT CONTRAST TECHNIQUE: Contiguous axial images were obtained from the base of the skull through the vertex without intravenous contrast. COMPARISON:  01/14/2016 FINDINGS: Brain: Again noted is a subtle old infarct in the left cerebellar hemisphere. Changes compatibles with old infarct involving the posterior medial right temporal lobe and the right posterior internal capsule. Again noted is diffuse low density throughout the white matter. No evidence for acute hemorrhage, mass lesion, midline shift hydrocephalus or new large infarct. Vascular: Calcifications in the vertebral arteries. Skull: No calvarial fracture. Sinuses/Orbits: Visualized paranasal sinuses are clear. Other: None IMPRESSION: No acute intracranial abnormality. Old infarcts. Diffuse white matter disease probably represents chronic small vessel ischemic changes. Electronically Signed   By: Richarda OverlieAdam  Henn M.D.   On: 02/01/2016 14:21   Mr Angiogram Head Wo Contrast  Result Date: 02/01/2016 CLINICAL DATA:  Initial evaluation  for acute altered mental status, confusion. Worsening left-sided weakness. EXAM: MRI HEAD WITHOUT CONTRAST MRA HEAD WITHOUT CONTRAST TECHNIQUE: Multiplanar, multiecho pulse sequences of the brain and surrounding structures were obtained without intravenous contrast. Angiographic images of the head were obtained using MRA technique without contrast. COMPARISON:  Prior CT from 02/01/2016 as well as previous MRI from 01/14/2016. FINDINGS: MRI HEAD FINDINGS Brain: Examination limited by motion artifact. Diffuse prominence of the CSF containing spaces is compatible with generalized cerebral atrophy. Patchy and confluent T2/FLAIR hyperintensity within the periventricular and deep white matter both cerebral hemispheres most consistent with chronic microvascular ischemic disease. Remote left cerebellar infarct present. Additional scattered remote lacunar infarcts within the bilateral basal ganglia. Encephalomalacia within the mesial right temporal lobe and right basal ganglia also like related to remote ischemia. Associated wallerian degeneration on the right. There is patchy diffusion abnormality within the cortical gray matter of the anterior left frontal lobe and adjacent white matter of the left corona radiata (series 100, image 36). This is new relative to most recent MRI, and is most consistent with acute/ early subacute ischemia. No associated hemorrhage or mass effect. No mass lesion, midline shift, or mass effect. No hydrocephalus. No extra-axial fluid collection. Major dural sinuses are grossly patent. Pituitary gland within normal limits. Vascular: Major intracranial vascular flow voids are maintained. Skull and upper cervical spine: Craniocervical junction normal. Visualized upper cervical spine demonstrates no acute abnormality. Diffusely decreased signal intensity on T1 weighted sequences within the vertebral body bone marrow, nonspecific, but most commonly related to anemia, smoking, or obesity. No scalp soft  tissue abnormality. Sinuses/Orbits: Globes and orbital soft tissues demonstrate no acute abnormality. Patient is status post lens extraction bilaterally. Paranasal sinuses are clear. Trace opacity bilateral mastoid air cells. MRA HEAD FINDINGS ANTERIOR CIRCULATION: Study degraded by motion artifact. Distal cervical segments of the internal carotid arteries are patent with antegrade flow. Petrous segments patent bilaterally. Scattered atheromatous irregularity  within the cavernous ICAs without flow-limiting stenosis. A1 segments patent. Anterior communicating artery normal in. Anterior cerebral arteries demonstrate multifocal atheromatous irregularity but are patent to their distal aspects. M1 segments patent without occlusion. Moderate smooth narrowing proximal left M1 segment. Right M1 segment patent without stenosis. Distal MCA branches somewhat poorly evaluated on this motion degraded study, but are relatively symmetric. Distal small vessel atheromatous irregularity present. POSTERIOR CIRCULATION: Vertebral arteries patent to the vertebrobasilar junction. Basilar artery demonstrates multifocal irregularity with mild to moderate stenoses. Superior cerebellar arteries patent proximally. Both of the posterior cerebral artery supplied via the basilar artery and are patent to their distal aspects. Multifocal atheromatous irregularity within the P2 segments, right greater than left with mild to moderate stenoses. No aneurysm or vasculopathy. IMPRESSION: MRI HEAD IMPRESSION: 1. Patchy acute/subacute ischemic infarcts involving the anterior left frontal lobe as above. No associated hemorrhage or mass effect. 2. Otherwise stable atrophy with extensive chronic microvascular ischemic disease and sequela of remote ischemia as above. MRA HEAD IMPRESSION: 1. Negative intracranial MRA for large or proximal arterial branch occlusion. 2. Moderate smooth stenosis of the proximal left M1 segment. Additional atheromatous  irregularity throughout the anterior circulation without high-grade stenosis. 3. Moderate multifocal atheromatous irregularity with a dx subacute course he is having weakness or wrong side although it AE stenoses involving the basilar artery. Additional atheromatous irregularity involving the PCAs bilaterally, right greater than left. Electronically Signed   By: Rise Mu M.D.   On: 02/01/2016 19:04   Mr Brain Wo Contrast  Result Date: 02/01/2016 CLINICAL DATA:  Initial evaluation for acute altered mental status, confusion. Worsening left-sided weakness. EXAM: MRI HEAD WITHOUT CONTRAST MRA HEAD WITHOUT CONTRAST TECHNIQUE: Multiplanar, multiecho pulse sequences of the brain and surrounding structures were obtained without intravenous contrast. Angiographic images of the head were obtained using MRA technique without contrast. COMPARISON:  Prior CT from 02/01/2016 as well as previous MRI from 01/14/2016. FINDINGS: MRI HEAD FINDINGS Brain: Examination limited by motion artifact. Diffuse prominence of the CSF containing spaces is compatible with generalized cerebral atrophy. Patchy and confluent T2/FLAIR hyperintensity within the periventricular and deep white matter both cerebral hemispheres most consistent with chronic microvascular ischemic disease. Remote left cerebellar infarct present. Additional scattered remote lacunar infarcts within the bilateral basal ganglia. Encephalomalacia within the mesial right temporal lobe and right basal ganglia also like related to remote ischemia. Associated wallerian degeneration on the right. There is patchy diffusion abnormality within the cortical gray matter of the anterior left frontal lobe and adjacent white matter of the left corona radiata (series 100, image 36). This is new relative to most recent MRI, and is most consistent with acute/ early subacute ischemia. No associated hemorrhage or mass effect. No mass lesion, midline shift, or mass effect. No  hydrocephalus. No extra-axial fluid collection. Major dural sinuses are grossly patent. Pituitary gland within normal limits. Vascular: Major intracranial vascular flow voids are maintained. Skull and upper cervical spine: Craniocervical junction normal. Visualized upper cervical spine demonstrates no acute abnormality. Diffusely decreased signal intensity on T1 weighted sequences within the vertebral body bone marrow, nonspecific, but most commonly related to anemia, smoking, or obesity. No scalp soft tissue abnormality. Sinuses/Orbits: Globes and orbital soft tissues demonstrate no acute abnormality. Patient is status post lens extraction bilaterally. Paranasal sinuses are clear. Trace opacity bilateral mastoid air cells. MRA HEAD FINDINGS ANTERIOR CIRCULATION: Study degraded by motion artifact. Distal cervical segments of the internal carotid arteries are patent with antegrade flow. Petrous segments patent bilaterally. Scattered atheromatous irregularity  within the cavernous ICAs without flow-limiting stenosis. A1 segments patent. Anterior communicating artery normal in. Anterior cerebral arteries demonstrate multifocal atheromatous irregularity but are patent to their distal aspects. M1 segments patent without occlusion. Moderate smooth narrowing proximal left M1 segment. Right M1 segment patent without stenosis. Distal MCA branches somewhat poorly evaluated on this motion degraded study, but are relatively symmetric. Distal small vessel atheromatous irregularity present. POSTERIOR CIRCULATION: Vertebral arteries patent to the vertebrobasilar junction. Basilar artery demonstrates multifocal irregularity with mild to moderate stenoses. Superior cerebellar arteries patent proximally. Both of the posterior cerebral artery supplied via the basilar artery and are patent to their distal aspects. Multifocal atheromatous irregularity within the P2 segments, right greater than left with mild to moderate stenoses. No  aneurysm or vasculopathy. IMPRESSION: MRI HEAD IMPRESSION: 1. Patchy acute/subacute ischemic infarcts involving the anterior left frontal lobe as above. No associated hemorrhage or mass effect. 2. Otherwise stable atrophy with extensive chronic microvascular ischemic disease and sequela of remote ischemia as above. MRA HEAD IMPRESSION: 1. Negative intracranial MRA for large or proximal arterial branch occlusion. 2. Moderate smooth stenosis of the proximal left M1 segment. Additional atheromatous irregularity throughout the anterior circulation without high-grade stenosis. 3. Moderate multifocal atheromatous irregularity with a dx subacute course he is having weakness or wrong side although it AE stenoses involving the basilar artery. Additional atheromatous irregularity involving the PCAs bilaterally, right greater than left. Electronically Signed   By: Rise Mu M.D.   On: 02/01/2016 19:04    ____________________________________________   PROCEDURES  Procedure(s) performed:   .Critical Care Performed by: Loleta Rose Authorized by: Loleta Rose   Critical care provider statement:    Critical care time (minutes):  30   Critical care time was exclusive of:  Separately billable procedures and treating other patients   Critical care was necessary to treat or prevent imminent or life-threatening deterioration of the following conditions:  CNS failure or compromise   Critical care was time spent personally by me on the following activities:  Development of treatment plan with patient or surrogate, discussions with consultants, evaluation of patient's response to treatment, examination of patient, obtaining history from patient or surrogate, ordering and performing treatments and interventions, ordering and review of laboratory studies, ordering and review of radiographic studies, pulse oximetry, re-evaluation of patient's condition and review of old charts      Critical Care  performed: Yes, see critical care procedure note(s) ____________________________________________   INITIAL IMPRESSION / ASSESSMENT AND PLAN / ED COURSE  Pertinent labs & imaging results that were available during my care of the patient were reviewed by me and considered in my medical decision making (see chart for details).  It is possible the patient had some symptoms of a TIA but she is not a TPA candidate because the onset of symptoms was obtained during the night and her symptoms seem to have resolved.  Her head CT is nonacute.  I am awaiting a urinalysis as it is possible for someone with significant deficits from prior CVA to have worsening of her symptoms acutely if she has acute infection.  She has no respiratory abnormalities initially well-appearing at this point.  I explained to the patient and her husband that if she has a reassuring medical screening exam that she will likely be discharged back to her facility and they are both comfortable with that plan.  There is no indication for MR imaging at this time.   Clinical Course  Comment By Time  Workup has been  unremarkable.  Husband is still concerned because he states that she continues to "drifting in and out" and acting like she did in the past when she had a stroke.  We discussed possible MRI and they would both feel more comfortable if they do that today to rule out a new CVA. Loleta Roseory Jevaughn Degollado, MD 11/04 1615  Acute and subacute front lobe CVA on the MRIs . Patient's mental status seems to be declining; she pulled out her PIV, is picking at invisible objects in the air, etc.  I will give her haldol 2.5 mg IV for agitation, will admit for further management.  Protecting airway, no indication for intubation.   Loleta Roseory Bently Wyss, MD 11/04 1929    NIH Stroke Scale  Interval: After MRI results Time: 07:30 PM Person Administering Scale: Angline Schweigert  Administer stroke scale items in the order listed. Record performance in each category after  each subscale exam. Do not go back and change scores. Follow directions provided for each exam technique. Scores should reflect what the patient does, not what the clinician thinks the patient can do. The clinician should record answers while administering the exam and work quickly. Except where indicated, the patient should not be coached (i.e., repeated requests to patient to make a special effort).   1a  Level of consciousness: 1=not alert but arousable by minor stimulation to obey, answer or respond  1b. LOC questions:  1=Performs one task correctly  1c. LOC commands: 1=Performs one task correctly  2.  Best Gaze: 0=normal  3.  Visual: 0=No visual loss  4. Facial Palsy: 0=Normal symmetric movement  5a.  Motor left arm: 1=Drift, limb holds 90 (or 45) degrees but drifts down before full 10 seconds: does not hit bed  5b.  Motor right arm: 1=Drift, limb holds 90 (or 45) degrees but drifts down before full 10 seconds: does not hit bed  6a. motor left leg: 1=Drift, limb holds 90 (or 45) degrees but drifts down before full 10 seconds: does not hit bed  6b  Motor right leg:  1=Drift, limb holds 90 (or 45) degrees but drifts down before full 10 seconds: does not hit bed  7. Limb Ataxia: 0=Absent  8.  Sensory: 0=Normal; no sensory loss  9. Best Language:  0=No aphasia, normal  10. Dysarthria: 0=Normal  11. Extinction and Inattention: 1=Visual, tactile, auditory, spatial or personal inattention or extinction to bilateral simultaneous stimulation in one of the sensory modalities  12. Distal motor function: 0=Normal   Total:   8   The patient is not a candidate for TPA because the CVA happened overnight; she was normal when she went to bed and when they found her in the morning she was abnormal.    Her NIHSS would not be so high except that she cannot maintain her attention long enough to complete a task.  Holding anticoagulation at this time, deferring to hospitalist since she has not yet had her swallow  study and she is altered enough I do not want her to aspirate. ____________________________________________  FINAL CLINICAL IMPRESSION(S) / ED DIAGNOSES  Final diagnoses:  Acute cerebrovascular accident (CVA) (HCC)  Altered mental status, unspecified altered mental status type     MEDICATIONS GIVEN DURING THIS VISIT:  Medications    haloperidol lactate (HALDOL) injection 2.5 mg (2.5 mg Intravenous Given 02/01/16 1957)     NEW OUTPATIENT MEDICATIONS STARTED DURING THIS VISIT:  New Prescriptions   No medications on file    Modified Medications   No  medications on file    Discontinued Medications   No medications on file     Note:  This document was prepared using Dragon voice recognition software and may include unintentional dictation errors.    Loleta Rose, MD 02/01/16 2034    Loleta Rose, MD 02/01/16 2035

## 2016-02-01 NOTE — ED Notes (Signed)
Pt to ct 

## 2016-02-01 NOTE — ED Notes (Signed)
Pt to MRI

## 2016-02-01 NOTE — H&P (Signed)
[facility] [place] at [facility]   PATIENT NAME: Molly [name]    MR#:  [id number]  DATE OF BIRTH:  04-25-1945  DATE OF ADMISSION:  02/01/2016  PRIMARY CARE PHYSICIAN: Elita Boone, MD   REQUESTING/REFERRING PHYSICIAN: Altered mental status  CHIEF COMPLAINT:   Chief Complaint  Patient presents with  . Altered Mental Status    HISTORY OF PRESENT ILLNESS: Molly [name]  is a 70 y.o. female with a known history of Anxiety, COPD, diabetes, previous history of CVA who chronically has limited use of the left upper extremity who is brought in from [facility] due to altered mental status. Patient actually was in the hospital recently for dizziness and at that time had an MRI, carotid Dopplers echocardiogram of the heart and was discharged to rehabilitation. Patient was awake and oriented until this morning when she had a delayed response and was picking things up in the air. Patient was brought to the ED and underwent MRI of the brain which showed patchy acute to subacute involving the anterior left frontal lobe was noted. Patient's husband is at the bedside. She is not able to provide me with complete review of systems she is able to answer a few questions.  PAST MEDICAL HISTORY:   Past Medical History:  Diagnosis Date  . Anxiety   . COPD (chronic obstructive pulmonary disease) (HCC)   . Diabetes (HCC)   . Hemorrhoid   . HLD (hyperlipidemia)   . HTN (hypertension)   . Stroke [facility]) 2008   left weakness    PAST SURGICAL HISTORY: Past Surgical History:  Procedure Laterality Date  . AMPUTATION Left 05/22/2014   Procedure: AMPUTATION RAY LEFT FOURTH AND FIFTH ;  Surgeon: Toni Arthurs, MD;  Location: [facility] OR;  Service: Orthopedics;  Laterality: Left;  . CATARACT EXTRACTION    . COLONOSCOPY  2004    SOCIAL HISTORY:  Social History  Substance Use Topics  . Smoking status: Former Smoker    Packs/day: 0.50    Years: 2.00    Types: Cigarettes    Quit  date: 11/18/1968  . Smokeless tobacco: Never Used  . Alcohol use No     Comment: seldom    FAMILY HISTORY:  Family History  Problem Relation Age of Onset  . Hyperlipidemia Mother   . Varicose Veins Mother   . Deep vein thrombosis Father   . Stroke Father   . Breast cancer Paternal Aunt   . Stroke Maternal Grandmother   . Stroke Paternal Grandmother     DRUG ALLERGIES: No Known Allergies  REVIEW OF SYSTEMS:   Unable to provide due to her mental status   MEDICATIONS AT HOME:  Prior to Admission medications   Medication Sig Start Date End Date Taking? Authorizing Provider  amitriptyline (ELAVIL) 50 MG tablet Take 50 mg by mouth daily.   Yes Historical Provider, MD  Ascorbic Acid (VITAMIN C) 1000 MG tablet Take 1,000 mg by mouth daily.   Yes Historical Provider, MD  aspirin EC 81 MG tablet Take 81 mg by mouth daily.   Yes Historical Provider, MD  B Complex-C (SUPER B COMPLEX PO) Take 1 tablet by mouth daily.    Yes Historical Provider, MD  Bilberry, Vaccinium myrtillus, (BILBERRY PO) Take 1 capsule by mouth daily. 160mg    Yes Historical Provider, MD  buPROPion (WELLBUTRIN XL) 300 MG 24 hr tablet Take 300 mg by mouth daily.   Yes Historical Provider, MD  calcium carbonate (OS-CAL) 600 MG  TABS tablet Take 600 mg by mouth daily.   Yes Historical Provider, MD  Cholecalciferol (VITAMIN D3) 1000 UNITS CAPS Take 1,000 Units by mouth daily.   Yes Historical Provider, MD  clopidogrel (PLAVIX) 75 MG tablet Take 75 mg by mouth daily.   Yes Historical Provider, MD  Coenzyme Q10 (COQ10 PO) Take 1 capsule by mouth daily.    Yes Historical Provider, MD  docusate sodium (COLACE) 100 MG capsule Take 100 mg by mouth daily.   Yes Historical Provider, MD  GLUCOSAMINE-CHONDROITIN PO Take 1 tablet by mouth 2 (two) times daily.    Yes Historical Provider, MD  insulin aspart protamine- aspart (NOVOLOG MIX 70/30) (70-30) 100 UNIT/ML injection Inject 100-120 Units into the skin 2 (two) times daily.    Yes  Historical Provider, MD  insulin lispro (HUMALOG) 100 UNIT/ML injection Inject into the skin once. Sliding scale 0-99=0 units 100-149=2 units 150-199=3 units 200-249=4 units 250-299=5 units 300-349=6 units 350-399= 7 units 400-449=8 units   Yes Historical Provider, MD  losartan (COZAAR) 50 MG tablet Take 50 mg by mouth daily.   Yes Historical Provider, MD  magnesium oxide (MAG-OX) 400 MG tablet Take 400 mg by mouth 2 (two) times daily.   Yes Historical Provider, MD  meclizine (ANTIVERT) 25 MG tablet Take 1 tablet (25 mg total) by mouth 3 (three) times daily. 01/16/16  Yes Ramonita Lab, MD  Melatonin 10 MG TBDP Take 10 mg by mouth at bedtime.   Yes Historical Provider, MD  metFORMIN (GLUCOPHAGE) 500 MG tablet Take 500 mg by mouth 2 (two) times daily.    Yes Historical Provider, MD  Multiple Vitamins-Minerals (OCUVITE PO) Take 1 capsule by mouth daily.    Yes Historical Provider, MD  niacin 500 MG tablet Take 500 mg by mouth daily.    Yes Historical Provider, MD  Omega-3 Fatty Acids (FISH OIL) 1000 MG CPDR Take 1,200 mg by mouth daily.   Yes Historical Provider, MD  polyethylene glycol (MIRALAX / GLYCOLAX) packet Take 17 g by mouth daily.   Yes Historical Provider, MD  senna (SENOKOT) 8.6 MG TABS tablet Take 1 tablet by mouth 2 (two) times daily.   Yes Historical Provider, MD  Valerian 450 MG CAPS Take 1 tablet by mouth daily as needed (insomnia).    Yes Historical Provider, MD  vitamin E 400 UNIT capsule Take 400 Units by mouth daily.   Yes Historical Provider, MD  bisacodyl (DULCOLAX) 5 MG EC tablet Take 1 tablet (5 mg total) by mouth daily as needed for moderate constipation. Patient not taking: Reported on 02/01/2016 01/16/16   Ramonita Lab, MD  collagenase (SANTYL) ointment Apply topically daily. Patient not taking: Reported on 02/01/2016 01/17/16   Ramonita Lab, MD  doxycycline (VIBRA-TABS) 100 MG tablet Take 1 tablet (100 mg total) by mouth every 12 (twelve) hours. Patient not taking:  Reported on 02/01/2016 01/16/16   Ramonita Lab, MD  insulin aspart (NOVOLOG) 100 UNIT/ML injection Inject 0-15 Units into the skin 3 (three) times daily with meals. Patient not taking: Reported on 02/01/2016 01/16/16   Ramonita Lab, MD  insulin aspart (NOVOLOG) 100 UNIT/ML injection Inject 0-5 Units into the skin at bedtime. Patient not taking: Reported on 02/01/2016 01/16/16   Ramonita Lab, MD  naproxen sodium (ANAPROX) 220 MG tablet Take 220-440 mg by mouth 2 (two) times daily as needed (for pain).     Historical Provider, MD      PHYSICAL EXAMINATION:   VITAL SIGNS: Blood pressure (!) 142/69, pulse 96,  temperature 97.4 F (36.3 C), temperature source Oral, resp. rate 18, height 5\' 6"  (1.676 m), weight 300 lb (136.1 kg), SpO2 100 %.  GENERAL:  70 y.o.-year-old patient lying in the bed with no acute distress.  EYES: Pupils equal, round, reactive to light and accommodation. No scleral icterus.  HEENT: Head atraumatic, normocephalic. Oropharynx and nasopharynx clear.  NECK:  Supple, no jugular venous distention. No thyroid enlargement, no tenderness.  LUNGS: Normal breath sounds bilaterally, no wheezing, rales,rhonchi or crepitation. No use of accessory muscles of respiration.  CARDIOVASCULAR: S1, S2 normal. No murmurs, rubs, or gallops.  ABDOMEN: Soft, nontender, nondistended. Bowel sounds present. No organomegaly or mass.  EXTREMITIES: No pedal edema, cyanosis, or clubbing.  NEUROLOGIC: Cranial nerves II through XII are intact. Left upper extremity unable to move which is chronic for her Able to move her right upper extremity and both lower extremities. Patient is trying to pick him with the right hand.   PSYCHIATRIC: The patient is alert and not oriented to place or time only oriented to person SKIN: No obvious rash, lesion, or ulcer.   LABORATORY PANEL:   CBC  Recent Labs Lab 02/01/16 1406  WBC 12.6*  HGB 14.3  HCT 42.6  PLT 367  MCV 93.9  MCH 31.5  MCHC 33.5  RDW 14.5    ------------------------------------------------------------------------------------------------------------------  Chemistries   Recent Labs Lab 02/01/16 1406  NA 137  K 4.6  CL 98*  CO2 30  GLUCOSE 76  BUN 24*  CREATININE 0.93  CALCIUM 10.8*  AST 36  ALT 42  ALKPHOS 66  BILITOT 0.9   ------------------------------------------------------------------------------------------------------------------ estimated creatinine clearance is 80 mL/min (by C-G formula based on SCr of 0.93 mg/dL). ------------------------------------------------------------------------------------------------------------------ No results for input(s): TSH, T4TOTAL, T3FREE, THYROIDAB in the last 72 hours.  Invalid input(s): FREET3   Coagulation profile  Recent Labs Lab 02/01/16 1406  INR 0.94   ------------------------------------------------------------------------------------------------------------------- No results for input(s): DDIMER in the last 72 hours. -------------------------------------------------------------------------------------------------------------------  Cardiac Enzymes No results for input(s): CKMB, TROPONINI, MYOGLOBIN in the last 168 hours.  Invalid input(s): CK ------------------------------------------------------------------------------------------------------------------ Invalid input(s): POCBNP  ---------------------------------------------------------------------------------------------------------------  Urinalysis    Component Value Date/Time   COLORURINE AMBER (A) 02/01/2016 1436   APPEARANCEUR HAZY (A) 02/01/2016 1436   LABSPEC 1.017 02/01/2016 1436   PHURINE 5.0 02/01/2016 1436   GLUCOSEU NEGATIVE 02/01/2016 1436   HGBUR NEGATIVE 02/01/2016 1436   BILIRUBINUR NEGATIVE 02/01/2016 1436   KETONESUR NEGATIVE 02/01/2016 1436   PROTEINUR 100 (A) 02/01/2016 1436   UROBILINOGEN 0.2 10/15/2006 1244   NITRITE NEGATIVE 02/01/2016 1436   LEUKOCYTESUR  NEGATIVE 02/01/2016 1436     RADIOLOGY: Ct Head Wo Contrast  Result Date: 02/01/2016 CLINICAL DATA:  Altered mental status. EXAM: CT HEAD WITHOUT CONTRAST TECHNIQUE: Contiguous axial images were obtained from the base of the skull through the vertex without intravenous contrast. COMPARISON:  01/14/2016 FINDINGS: Brain: Again noted is a subtle old infarct in the left cerebellar hemisphere. Changes compatibles with old infarct involving the posterior medial right temporal lobe and the right posterior internal capsule. Again noted is diffuse low density throughout the white matter. No evidence for acute hemorrhage, mass lesion, midline shift hydrocephalus or new large infarct. Vascular: Calcifications in the vertebral arteries. Skull: No calvarial fracture. Sinuses/Orbits: Visualized paranasal sinuses are clear. Other: None IMPRESSION: No acute intracranial abnormality. Old infarcts. Diffuse white matter disease probably represents chronic small vessel ischemic changes. Electronically Signed   By: Richarda OverlieAdam  Henn M.D.   On: 02/01/2016 14:21   Mr Angiogram  Head Wo Contrast  Result Date: 02/01/2016 CLINICAL DATA:  Initial evaluation for acute altered mental status, confusion. Worsening left-sided weakness. EXAM: MRI HEAD WITHOUT CONTRAST MRA HEAD WITHOUT CONTRAST TECHNIQUE: Multiplanar, multiecho pulse sequences of the brain and surrounding structures were obtained without intravenous contrast. Angiographic images of the head were obtained using MRA technique without contrast. COMPARISON:  Prior CT from 02/01/2016 as well as previous MRI from 01/14/2016. FINDINGS: MRI HEAD FINDINGS Brain: Examination limited by motion artifact. Diffuse prominence of the CSF containing spaces is compatible with generalized cerebral atrophy. Patchy and confluent T2/FLAIR hyperintensity within the periventricular and deep white matter both cerebral hemispheres most consistent with chronic microvascular ischemic disease. Remote left  cerebellar infarct present. Additional scattered remote lacunar infarcts within the bilateral basal ganglia. Encephalomalacia within the mesial right temporal lobe and right basal ganglia also like related to remote ischemia. Associated wallerian degeneration on the right. There is patchy diffusion abnormality within the cortical gray matter of the anterior left frontal lobe and adjacent white matter of the left corona radiata (series 100, image 36). This is new relative to most recent MRI, and is most consistent with acute/ early subacute ischemia. No associated hemorrhage or mass effect. No mass lesion, midline shift, or mass effect. No hydrocephalus. No extra-axial fluid collection. Major dural sinuses are grossly patent. Pituitary gland within normal limits. Vascular: Major intracranial vascular flow voids are maintained. Skull and upper cervical spine: Craniocervical junction normal. Visualized upper cervical spine demonstrates no acute abnormality. Diffusely decreased signal intensity on T1 weighted sequences within the vertebral body bone marrow, nonspecific, but most commonly related to anemia, smoking, or obesity. No scalp soft tissue abnormality. Sinuses/Orbits: Globes and orbital soft tissues demonstrate no acute abnormality. Patient is status post lens extraction bilaterally. Paranasal sinuses are clear. Trace opacity bilateral mastoid air cells. MRA HEAD FINDINGS ANTERIOR CIRCULATION: Study degraded by motion artifact. Distal cervical segments of the internal carotid arteries are patent with antegrade flow. Petrous segments patent bilaterally. Scattered atheromatous irregularity within the cavernous ICAs without flow-limiting stenosis. A1 segments patent. Anterior communicating artery normal in. Anterior cerebral arteries demonstrate multifocal atheromatous irregularity but are patent to their distal aspects. M1 segments patent without occlusion. Moderate smooth narrowing proximal left M1 segment. Right  M1 segment patent without stenosis. Distal MCA branches somewhat poorly evaluated on this motion degraded study, but are relatively symmetric. Distal small vessel atheromatous irregularity present. POSTERIOR CIRCULATION: Vertebral arteries patent to the vertebrobasilar junction. Basilar artery demonstrates multifocal irregularity with mild to moderate stenoses. Superior cerebellar arteries patent proximally. Both of the posterior cerebral artery supplied via the basilar artery and are patent to their distal aspects. Multifocal atheromatous irregularity within the P2 segments, right greater than left with mild to moderate stenoses. No aneurysm or vasculopathy. IMPRESSION: MRI HEAD IMPRESSION: 1. Patchy acute/subacute ischemic infarcts involving the anterior left frontal lobe as above. No associated hemorrhage or mass effect. 2. Otherwise stable atrophy with extensive chronic microvascular ischemic disease and sequela of remote ischemia as above. MRA HEAD IMPRESSION: 1. Negative intracranial MRA for large or proximal arterial branch occlusion. 2. Moderate smooth stenosis of the proximal left M1 segment. Additional atheromatous irregularity throughout the anterior circulation without high-grade stenosis. 3. Moderate multifocal atheromatous irregularity with a dx subacute course he is having weakness or wrong side although it AE stenoses involving the basilar artery. Additional atheromatous irregularity involving the PCAs bilaterally, right greater than left. Electronically Signed   By: Rise Mu M.D.   On: 02/01/2016 19:04   Mr  Brain Wo Contrast  Result Date: 02/01/2016 CLINICAL DATA:  Initial evaluation for acute altered mental status, confusion. Worsening left-sided weakness. EXAM: MRI HEAD WITHOUT CONTRAST MRA HEAD WITHOUT CONTRAST TECHNIQUE: Multiplanar, multiecho pulse sequences of the brain and surrounding structures were obtained without intravenous contrast. Angiographic images of the head were  obtained using MRA technique without contrast. COMPARISON:  Prior CT from 02/01/2016 as well as previous MRI from 01/14/2016. FINDINGS: MRI HEAD FINDINGS Brain: Examination limited by motion artifact. Diffuse prominence of the CSF containing spaces is compatible with generalized cerebral atrophy. Patchy and confluent T2/FLAIR hyperintensity within the periventricular and deep white matter both cerebral hemispheres most consistent with chronic microvascular ischemic disease. Remote left cerebellar infarct present. Additional scattered remote lacunar infarcts within the bilateral basal ganglia. Encephalomalacia within the mesial right temporal lobe and right basal ganglia also like related to remote ischemia. Associated wallerian degeneration on the right. There is patchy diffusion abnormality within the cortical gray matter of the anterior left frontal lobe and adjacent white matter of the left corona radiata (series 100, image 36). This is new relative to most recent MRI, and is most consistent with acute/ early subacute ischemia. No associated hemorrhage or mass effect. No mass lesion, midline shift, or mass effect. No hydrocephalus. No extra-axial fluid collection. Major dural sinuses are grossly patent. Pituitary gland within normal limits. Vascular: Major intracranial vascular flow voids are maintained. Skull and upper cervical spine: Craniocervical junction normal. Visualized upper cervical spine demonstrates no acute abnormality. Diffusely decreased signal intensity on T1 weighted sequences within the vertebral body bone marrow, nonspecific, but most commonly related to anemia, smoking, or obesity. No scalp soft tissue abnormality. Sinuses/Orbits: Globes and orbital soft tissues demonstrate no acute abnormality. Patient is status post lens extraction bilaterally. Paranasal sinuses are clear. Trace opacity bilateral mastoid air cells. MRA HEAD FINDINGS ANTERIOR CIRCULATION: Study degraded by motion artifact.  Distal cervical segments of the internal carotid arteries are patent with antegrade flow. Petrous segments patent bilaterally. Scattered atheromatous irregularity within the cavernous ICAs without flow-limiting stenosis. A1 segments patent. Anterior communicating artery normal in. Anterior cerebral arteries demonstrate multifocal atheromatous irregularity but are patent to their distal aspects. M1 segments patent without occlusion. Moderate smooth narrowing proximal left M1 segment. Right M1 segment patent without stenosis. Distal MCA branches somewhat poorly evaluated on this motion degraded study, but are relatively symmetric. Distal small vessel atheromatous irregularity present. POSTERIOR CIRCULATION: Vertebral arteries patent to the vertebrobasilar junction. Basilar artery demonstrates multifocal irregularity with mild to moderate stenoses. Superior cerebellar arteries patent proximally. Both of the posterior cerebral artery supplied via the basilar artery and are patent to their distal aspects. Multifocal atheromatous irregularity within the P2 segments, right greater than left with mild to moderate stenoses. No aneurysm or vasculopathy. IMPRESSION: MRI HEAD IMPRESSION: 1. Patchy acute/subacute ischemic infarcts involving the anterior left frontal lobe as above. No associated hemorrhage or mass effect. 2. Otherwise stable atrophy with extensive chronic microvascular ischemic disease and sequela of remote ischemia as above. MRA HEAD IMPRESSION: 1. Negative intracranial MRA for large or proximal arterial branch occlusion. 2. Moderate smooth stenosis of the proximal left M1 segment. Additional atheromatous irregularity throughout the anterior circulation without high-grade stenosis. 3. Moderate multifocal atheromatous irregularity with a dx subacute course he is having weakness or wrong side although it AE stenoses involving the basilar artery. Additional atheromatous irregularity involving the PCAs bilaterally,  right greater than left. Electronically Signed   By: Rise Mu M.D.   On: 02/01/2016 19:04  EKG: Orders placed or performed during the hospital encounter of 02/01/16  . ED EKG  . ED EKG    IMPRESSION AND PLAN: Patient is a 70 year old with altered mental*  1. Acute encephalopathy Suspect due to acute CVA Will admit to the floor I will have neurology evaluate the patient in the morning  2. Acute CVA Recent workup including carotid Dopplers showed plaque and echocardiogram was was done so and only to repeat I will continue aspirin and Plavix Neuro eval in the morning  3. Essential hypertension Continue losartan therapy   4. Diabetes type 2 I will place her on sliding scale insulin for now blood sugar was 79 hold her insulin and oral medications  5. Hyperlipidemia continue her home regimen   6. Miscellaneous Lovenox for DVT prophylaxis   All the records are reviewed and case discussed with ED provider. Management plans discussed with the patient, family and they are in agreement.  CODE STATUS: Code Status History    Date Active Date Inactive Code Status Order ID Comments User Context   01/14/2016  2:01 PM 01/16/2016 10:22 PM Full Code [id number]  Katha HammingSnehalatha Konidena, MD ED   11/19/2014 10:40 PM 11/21/2014  8:55 PM Full Code [id number]  Oralia Manisavid Willis, MD Inpatient   05/22/2014  6:31 PM 05/25/2014  8:20 PM Full Code [id number]  Toni ArthursJohn Hewitt, MD Inpatient   05/21/2014 10:31 PM 05/22/2014  6:31 PM Full Code [id number]  Yevonne PaxSaadat A Khan, MD Inpatient       TOTAL TIME TAKING CARE OF THIS PATIENT:8155minutes.    Auburn BilberryPATEL, Orva Riles M.D on 02/01/2016 at 7:37 PM  Between 7am to 6pm - Pager - [phone]  After 6pm go to [email or web address] - password [email or web address] [facility]  [facility]  Office  [phone]  CC: Primary care physician; Elita Booneoberts, Caroline C, MD

## 2016-02-01 NOTE — ED Notes (Signed)
Patient's husband describes event in which patient was nonverbal and pointing for approximately 10 minutes. Upon assessment pt was A&O w/delayed response. MD made aware. Will continue to monitor.

## 2016-02-01 NOTE — ED Triage Notes (Signed)
[facility]. Per EMS facility staff noticed pt slower to respond than usual concerned about stroke bc past hx with left side deficits. Pt alert and oriented x4, in no acute distress at this time.

## 2016-02-01 NOTE — Progress Notes (Signed)
PHARMACIST - PHYSICIAN ORDER COMMUNICATION  CONCERNING: P&T Medication Policy on Herbal Medications  DESCRIPTION:  This patient's order for:  Valerian CAPS 450 mg  has been noted.  This product(s) is classified as an "herbal" or natural product. Due to a lack of definitive safety studies or FDA approval, nonstandard manufacturing practices, plus the potential risk of unknown drug-drug interactions while on inpatient medications, the Pharmacy and Therapeutics Committee does not permit the use of "herbal" or natural products of this type within [facility].   ACTION TAKEN: The pharmacy department is unable to verify this order at this time and your patient has been informed of this safety policy. Please reevaluate patient's clinical condition at discharge and address if the herbal or natural product(s) should be resumed at that time.  Cher NakaiSheema Keyundra Fant, PharmD Clinical Pharmacist 02/01/2016 9:25 PM

## 2016-02-01 NOTE — ED Notes (Signed)
[facility]oke with [facility] at [facility], staff reported symptoms discovered at 1130, unable to determine LKW time.

## 2016-02-02 DIAGNOSIS — I639 Cerebral infarction, unspecified: Principal | ICD-10-CM

## 2016-02-02 LAB — BLOOD GAS, ARTERIAL
Acid-Base Excess: 6.4 mmol/L — ABNORMAL HIGH (ref 0.0–2.0)
Bicarbonate: 32.3 mmol/L — ABNORMAL HIGH (ref 20.0–28.0)
FIO2: 0.21
O2 Saturation: 93.2 %
Patient temperature: 37
pCO2 arterial: 51 mmHg — ABNORMAL HIGH (ref 32.0–48.0)
pH, Arterial: 7.41 (ref 7.350–7.450)
pO2, Arterial: 67 mmHg — ABNORMAL LOW (ref 83.0–108.0)

## 2016-02-02 LAB — LIPID PANEL
CHOL/HDL RATIO: 5 ratio
Cholesterol: 150 mg/dL (ref 0–200)
HDL: 30 mg/dL — ABNORMAL LOW (ref >40)
LDL CALC: 81 mg/dL (ref 0–99)
Triglycerides: 196 mg/dL — ABNORMAL HIGH (ref <150)
VLDL: 39 mg/dL (ref 0–40)

## 2016-02-02 LAB — GLUCOSE, CAPILLARY
GLUCOSE-CAPILLARY: 105 mg/dL — AB (ref 65–99)
GLUCOSE-CAPILLARY: 114 mg/dL — AB (ref 65–99)
Glucose-Capillary: 184 mg/dL — ABNORMAL HIGH (ref 65–99)
Glucose-Capillary: 132 mg/dL — ABNORMAL HIGH (ref 65–99)
Glucose-Capillary: 179 mg/dL — ABNORMAL HIGH (ref 65–99)

## 2016-02-02 MED ORDER — INSULIN ASPART 100 UNIT/ML ~~LOC~~ SOLN
0.0000 [IU] | Freq: Three times a day (TID) | SUBCUTANEOUS | Status: DC
  Administered 2016-02-03 (×3): 2 [IU] via SUBCUTANEOUS
  Administered 2016-02-04: 08:00:00 3 [IU] via SUBCUTANEOUS
  Administered 2016-02-04: 17:00:00 5 [IU] via SUBCUTANEOUS
  Administered 2016-02-04: 2 [IU] via SUBCUTANEOUS
  Administered 2016-02-05: 09:00:00 3 [IU] via SUBCUTANEOUS
  Filled 2016-02-02: qty 2
  Filled 2016-02-02: qty 5
  Filled 2016-02-02: qty 3
  Filled 2016-02-02 (×3): qty 2
  Filled 2016-02-02: qty 3

## 2016-02-02 MED ORDER — INSULIN ASPART 100 UNIT/ML ~~LOC~~ SOLN
0.0000 [IU] | Freq: Every day | SUBCUTANEOUS | Status: DC
Start: 2016-02-02 — End: 2016-02-05
  Administered 2016-02-03: 22:00:00 2 [IU] via SUBCUTANEOUS
  Administered 2016-02-04: 3 [IU] via SUBCUTANEOUS
  Filled 2016-02-02: qty 3
  Filled 2016-02-02: qty 2

## 2016-02-02 MED ORDER — ATORVASTATIN CALCIUM 20 MG PO TABS
40.0000 mg | ORAL_TABLET | Freq: Every day | ORAL | Status: DC
  Administered 2016-02-02 – 2016-02-04 (×3): 40 mg via ORAL
  Filled 2016-02-02 (×3): qty 2

## 2016-02-02 NOTE — Plan of Care (Signed)
Problem: Acute Rehab PT Goals(only PT should resolve) Goal: Pt Will Transfer Bed To Chair/Chair To Bed Of 2 person on sliding board

## 2016-02-02 NOTE — Care Management Important Message (Signed)
Important Message  Patient Details  Name: Molly [name] MRN: [id number] Date of Birth: 10-Apr-1945   Medicare Important Message Given:  Yes    Kaylah Chiasson A, RN 02/02/2016, 11:04 AM

## 2016-02-02 NOTE — Progress Notes (Signed)
[facility] -  at [facility]   PATIENT NAME: Molly DustmanBrenda [name]    MR#:  [id number]  DATE OF BIRTH:  01-26-1946  SUBJECTIVE:  CHIEF COMPLAINT:   Chief Complaint  Patient presents with  . Altered Mental Status   -[facility] due to confusion. Remains confused. Speech is more clear today. Husband at bedside. -Has chronic left upper extremity weakness from a previous stroke  REVIEW OF SYSTEMS:  Review of Systems  Unable to perform ROS: Mental acuity    DRUG ALLERGIES:  No Known Allergies  VITALS:  Blood pressure 133/79, pulse 88, temperature 98 F (36.7 C), temperature source Oral, resp. rate 18, height 5\' 6"  (1.676 m), weight 114.9 kg (253 lb 4.8 oz), SpO2 95 %.  PHYSICAL EXAMINATION:  Physical Exam  GENERAL:  70 y.o.-year-old Obese patient lying in the bed with no acute distress.  EYES: Pupils equal, round, reactive to light and accommodation. No scleral icterus. Extraocular muscles intact.  HEENT: Head atraumatic, normocephalic. Oropharynx and nasopharynx clear.  NECK:  Supple, no jugular venous distention. No thyroid enlargement, no tenderness.  LUNGS: Normal breath sounds bilaterally, no wheezing, rales,rhonchi or crepitation. No use of accessory muscles of respiration.  By basilar breath sounds CARDIOVASCULAR: S1, S2 normal. No murmurs, rubs, or gallops.  ABDOMEN: Soft, nontender, nondistended. Bowel sounds present. No organomegaly or mass.  EXTREMITIES: No pedal edema, cyanosis, or clubbing.  NEUROLOGIC: Cranial nerves II through XII are intact. Muscle strength 5/5 in all extremities except left upper extremity where the strength is 3/5. Decreased sensation in both feet due to neuropathy. No new changes in sensation.. Gait not checked.  PSYCHIATRIC: The patient is alert and oriented to self  SKIN: No obvious rash, lesion, or ulcer.    LABORATORY PANEL:   CBC  Recent Labs Lab 02/01/16 1406  WBC 12.6*  HGB 14.3  HCT 42.6   PLT 367   ------------------------------------------------------------------------------------------------------------------  Chemistries   Recent Labs Lab 02/01/16 1406  NA 137  K 4.6  CL 98*  CO2 30  GLUCOSE 76  BUN 24*  CREATININE 0.93  CALCIUM 10.8*  AST 36  ALT 42  ALKPHOS 66  BILITOT 0.9   ------------------------------------------------------------------------------------------------------------------  Cardiac Enzymes  Recent Labs Lab 02/01/16 1406  TROPONINI <0.03   ------------------------------------------------------------------------------------------------------------------  RADIOLOGY:  Ct Head Wo Contrast  Result Date: 02/01/2016 CLINICAL DATA:  Altered mental status. EXAM: CT HEAD WITHOUT CONTRAST TECHNIQUE: Contiguous axial images were obtained from the base of the skull through the vertex without intravenous contrast. COMPARISON:  01/14/2016 FINDINGS: Brain: Again noted is a subtle old infarct in the left cerebellar hemisphere. Changes compatibles with old infarct involving the posterior medial right temporal lobe and the right posterior internal capsule. Again noted is diffuse low density throughout the white matter. No evidence for acute hemorrhage, mass lesion, midline shift hydrocephalus or new large infarct. Vascular: Calcifications in the vertebral arteries. Skull: No calvarial fracture. Sinuses/Orbits: Visualized paranasal sinuses are clear. Other: None IMPRESSION: No acute intracranial abnormality. Old infarcts. Diffuse white matter disease probably represents chronic small vessel ischemic changes. Electronically Signed   By: Richarda OverlieAdam  Henn M.D.   On: 02/01/2016 14:21   Mr Angiogram Head Wo Contrast  Result Date: 02/01/2016 CLINICAL DATA:  Initial evaluation for acute altered mental status, confusion. Worsening left-sided weakness. EXAM: MRI HEAD WITHOUT CONTRAST MRA HEAD WITHOUT CONTRAST TECHNIQUE: Multiplanar, multiecho pulse sequences of the brain and  surrounding structures were obtained without intravenous contrast. Angiographic images of the head  were obtained using MRA technique without contrast. COMPARISON:  Prior CT from 02/01/2016 as well as previous MRI from 01/14/2016. FINDINGS: MRI HEAD FINDINGS Brain: Examination limited by motion artifact. Diffuse prominence of the CSF containing spaces is compatible with generalized cerebral atrophy. Patchy and confluent T2/FLAIR hyperintensity within the periventricular and deep white matter both cerebral hemispheres most consistent with chronic microvascular ischemic disease. Remote left cerebellar infarct present. Additional scattered remote lacunar infarcts within the bilateral basal ganglia. Encephalomalacia within the mesial right temporal lobe and right basal ganglia also like related to remote ischemia. Associated wallerian degeneration on the right. There is patchy diffusion abnormality within the cortical gray matter of the anterior left frontal lobe and adjacent white matter of the left corona radiata (series 100, image 36). This is new relative to most recent MRI, and is most consistent with acute/ early subacute ischemia. No associated hemorrhage or mass effect. No mass lesion, midline shift, or mass effect. No hydrocephalus. No extra-axial fluid collection. Major dural sinuses are grossly patent. Pituitary gland within normal limits. Vascular: Major intracranial vascular flow voids are maintained. Skull and upper cervical spine: Craniocervical junction normal. Visualized upper cervical spine demonstrates no acute abnormality. Diffusely decreased signal intensity on T1 weighted sequences within the vertebral body bone marrow, nonspecific, but most commonly related to anemia, smoking, or obesity. No scalp soft tissue abnormality. Sinuses/Orbits: Globes and orbital soft tissues demonstrate no acute abnormality. Patient is status post lens extraction bilaterally. Paranasal sinuses are clear. Trace opacity  bilateral mastoid air cells. MRA HEAD FINDINGS ANTERIOR CIRCULATION: Study degraded by motion artifact. Distal cervical segments of the internal carotid arteries are patent with antegrade flow. Petrous segments patent bilaterally. Scattered atheromatous irregularity within the cavernous ICAs without flow-limiting stenosis. A1 segments patent. Anterior communicating artery normal in. Anterior cerebral arteries demonstrate multifocal atheromatous irregularity but are patent to their distal aspects. M1 segments patent without occlusion. Moderate smooth narrowing proximal left M1 segment. Right M1 segment patent without stenosis. Distal MCA branches somewhat poorly evaluated on this motion degraded study, but are relatively symmetric. Distal small vessel atheromatous irregularity present. POSTERIOR CIRCULATION: Vertebral arteries patent to the vertebrobasilar junction. Basilar artery demonstrates multifocal irregularity with mild to moderate stenoses. Superior cerebellar arteries patent proximally. Both of the posterior cerebral artery supplied via the basilar artery and are patent to their distal aspects. Multifocal atheromatous irregularity within the P2 segments, right greater than left with mild to moderate stenoses. No aneurysm or vasculopathy. IMPRESSION: MRI HEAD IMPRESSION: 1. Patchy acute/subacute ischemic infarcts involving the anterior left frontal lobe as above. No associated hemorrhage or mass effect. 2. Otherwise stable atrophy with extensive chronic microvascular ischemic disease and sequela of remote ischemia as above. MRA HEAD IMPRESSION: 1. Negative intracranial MRA for large or proximal arterial branch occlusion. 2. Moderate smooth stenosis of the proximal left M1 segment. Additional atheromatous irregularity throughout the anterior circulation without high-grade stenosis. 3. Moderate multifocal atheromatous irregularity with a dx subacute course he is having weakness or wrong side although it AE  stenoses involving the basilar artery. Additional atheromatous irregularity involving the PCAs bilaterally, right greater than left. Electronically Signed   By: Rise Mu M.D.   On: 02/01/2016 19:04   Mr Brain Wo Contrast  Result Date: 02/01/2016 CLINICAL DATA:  Initial evaluation for acute altered mental status, confusion. Worsening left-sided weakness. EXAM: MRI HEAD WITHOUT CONTRAST MRA HEAD WITHOUT CONTRAST TECHNIQUE: Multiplanar, multiecho pulse sequences of the brain and surrounding structures were obtained without intravenous contrast. Angiographic images of the head  were obtained using MRA technique without contrast. COMPARISON:  Prior CT from 02/01/2016 as well as previous MRI from 01/14/2016. FINDINGS: MRI HEAD FINDINGS Brain: Examination limited by motion artifact. Diffuse prominence of the CSF containing spaces is compatible with generalized cerebral atrophy. Patchy and confluent T2/FLAIR hyperintensity within the periventricular and deep white matter both cerebral hemispheres most consistent with chronic microvascular ischemic disease. Remote left cerebellar infarct present. Additional scattered remote lacunar infarcts within the bilateral basal ganglia. Encephalomalacia within the mesial right temporal lobe and right basal ganglia also like related to remote ischemia. Associated wallerian degeneration on the right. There is patchy diffusion abnormality within the cortical gray matter of the anterior left frontal lobe and adjacent white matter of the left corona radiata (series 100, image 36). This is new relative to most recent MRI, and is most consistent with acute/ early subacute ischemia. No associated hemorrhage or mass effect. No mass lesion, midline shift, or mass effect. No hydrocephalus. No extra-axial fluid collection. Major dural sinuses are grossly patent. Pituitary gland within normal limits. Vascular: Major intracranial vascular flow voids are maintained. Skull and upper  cervical spine: Craniocervical junction normal. Visualized upper cervical spine demonstrates no acute abnormality. Diffusely decreased signal intensity on T1 weighted sequences within the vertebral body bone marrow, nonspecific, but most commonly related to anemia, smoking, or obesity. No scalp soft tissue abnormality. Sinuses/Orbits: Globes and orbital soft tissues demonstrate no acute abnormality. Patient is status post lens extraction bilaterally. Paranasal sinuses are clear. Trace opacity bilateral mastoid air cells. MRA HEAD FINDINGS ANTERIOR CIRCULATION: Study degraded by motion artifact. Distal cervical segments of the internal carotid arteries are patent with antegrade flow. Petrous segments patent bilaterally. Scattered atheromatous irregularity within the cavernous ICAs without flow-limiting stenosis. A1 segments patent. Anterior communicating artery normal in. Anterior cerebral arteries demonstrate multifocal atheromatous irregularity but are patent to their distal aspects. M1 segments patent without occlusion. Moderate smooth narrowing proximal left M1 segment. Right M1 segment patent without stenosis. Distal MCA branches somewhat poorly evaluated on this motion degraded study, but are relatively symmetric. Distal small vessel atheromatous irregularity present. POSTERIOR CIRCULATION: Vertebral arteries patent to the vertebrobasilar junction. Basilar artery demonstrates multifocal irregularity with mild to moderate stenoses. Superior cerebellar arteries patent proximally. Both of the posterior cerebral artery supplied via the basilar artery and are patent to their distal aspects. Multifocal atheromatous irregularity within the P2 segments, right greater than left with mild to moderate stenoses. No aneurysm or vasculopathy. IMPRESSION: MRI HEAD IMPRESSION: 1. Patchy acute/subacute ischemic infarcts involving the anterior left frontal lobe as above. No associated hemorrhage or mass effect. 2. Otherwise  stable atrophy with extensive chronic microvascular ischemic disease and sequela of remote ischemia as above. MRA HEAD IMPRESSION: 1. Negative intracranial MRA for large or proximal arterial branch occlusion. 2. Moderate smooth stenosis of the proximal left M1 segment. Additional atheromatous irregularity throughout the anterior circulation without high-grade stenosis. 3. Moderate multifocal atheromatous irregularity with a dx subacute course he is having weakness or wrong side although it AE stenoses involving the basilar artery. Additional atheromatous irregularity involving the PCAs bilaterally, right greater than left. Electronically Signed   By: Rise MuBenjamin  McClintock M.D.   On: 02/01/2016 19:04    EKG:   Orders placed or performed during the hospital encounter of 02/01/16  . ED EKG  . ED EKG  . EKG 12-Lead  . 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  #1 acute CVA-MRI of the brain showing acute patchy ischemic infarcts involving left frontal lobe. Also chronic microvascular ischemic disease changes. -Appreciate neurology consult. Continue aspirin and Plavix. Started statin. -Holter monitor to look for any arrhythmias due to possibility of embolic stroke. -Continue physical therapy, occupational therapy. -Recent further stroke workup including Dopplers, echo done with no significant findings. No need to repeat stroke workup.  #2 acute encephalopathy-likely secondary to stroke. No UTI noted. -EEG ordered to rule out seizures -Continue neuro checks and monitor.  #3 hypertension-on losartan  #4 diabetes mellitus-on sliding scale insulin for now  #5 DVT prophylaxis-Lovenox  Physical therapy, occupational therapy consults are  pending    All the records are reviewed and case discussed with Care Management/Social Workerr. Management plans discussed with the patient, family and they are in agreement.  CODE STATUS: Full code  TOTAL TIME TAKING CARE OF THIS PATIENT: 38 minutes.   POSSIBLE D/C IN 1-2 DAYS, DEPENDING ON CLINICAL CONDITION.   Enid Baas M.D on 02/02/2016 at 11:59 AM  Between 7am to 6pm - Pager - [phone]  After 6pm go to [email or web address] - Social research officer, government  [facility]  Office  [phone]  CC: Primary care physician; Elita Boone, MD

## 2016-02-02 NOTE — Clinical Social Work Note (Signed)
Clinical Social Work Assessment  Patient Details  Name: Molly HughsBrenda P Lauricella MRN: [id number] Date of Birth: 10/10/1945  Date of referral:  02/02/16               Reason for consult:  Facility Placement                Permission sought to share information with:  Oceanographeracility Contact Representative Permission granted to share information::  Yes, Verbal Permission Granted  Name::        Agency::  [facility]  Relationship::     Contact Information:     Housing/Transportation Living arrangements for the past 2 months:  Skilled Building surveyorursing Facility Source of Information:  Facility Patient Interpreter Needed:  None Criminal Activity/Legal Involvement Pertinent to Current Situation/Hospitalization:  No - Comment as needed Significant Relationships:  Spouse Lives with:  Facility Resident Do you feel safe going back to the place where you live?  Yes Need for family participation in patient care:  Yes (Comment)  Care giving concerns:  Admitted from facility   Social Worker assessment / plan:  [facility]. CSW attempted to contact spouse with no answer and left voice mail. According to the chart, the patient was presenting with AMS behaviors resulting in her acute admission.  According to [facility], the patient can return upon dc.   Employment status:  Retired Database administratornsurance information:  Managed Medicare PT Recommendations:  Not assessed at this time Information / Referral to community resources:     Patient/Family's Response to care:  Facility representative thanked CSW.  Patient/Family's Understanding of and Emotional Response to Diagnosis, Current Treatment, and Prognosis:  None noted.  Emotional Assessment Appearance:  Appears stated age Attitude/Demeanor/Rapport:   (Patient has dementia.) Affect (typically observed):  Appropriate Orientation:   (Patient has dementia.) Alcohol / Substance use:  Never Used Psych involvement (Current and /or in the  community):  No (Comment)  Discharge Needs  Concerns to be addressed:  Discharge Planning Concerns Readmission within the last 30 days:  No Current discharge risk:  None Barriers to Discharge:  Continued Medical Work up   [employer] CorporationKaren M Oran Dillenburg, LCSW 02/02/2016, 11:59 AM

## 2016-02-02 NOTE — Consult Note (Addendum)
Referring Physician: Nemiah Commander    Chief Complaint: Difficulty with speech and altered mental status  HPI: Molly [name] is an 70 y.o. female who can not provide much in the way of history.  All history obtained from husband.  Husband went to visit patient at her facility yesterday and noted that she was not at her baseline.  Responses were delayed and she was picking at things that were not there in the air.  After speaking with staff she had not been at her baseline since awakening.  Patient was brought in for evaluation at that time.  Initial NIHSS of 9. Husband reports patient is very near baseline this morning.  Patient's speech has actually been slurred for about a month but has now returned to baseline.    Date last known well: Unable to determine Time last known well: Unable to determine tPA Given: No: Unable to determine LKW  Past Medical History:  Diagnosis Date  . Anxiety   . COPD (chronic obstructive pulmonary disease) (HCC)   . Diabetes (HCC)   . Hemorrhoid   . HLD (hyperlipidemia)   . HTN (hypertension)   . Stroke [facility]) 2008   left weakness    Past Surgical History:  Procedure Laterality Date  . AMPUTATION Left 05/22/2014   Procedure: AMPUTATION RAY LEFT FOURTH AND FIFTH ;  Surgeon: Toni Arthurs, MD;  Location: [facility] OR;  Service: Orthopedics;  Laterality: Left;  . CATARACT EXTRACTION    . COLONOSCOPY  2004    Family History  Problem Relation Age of Onset  . Hyperlipidemia Mother   . Varicose Veins Mother   . Deep vein thrombosis Father   . Stroke Father   . Breast cancer Paternal Aunt   . Stroke Maternal Grandmother   . Stroke Paternal Grandmother    Social History:  reports that she quit smoking about 47 years ago. Her smoking use included Cigarettes. She has a 1.00 pack-year smoking history. She has never used smokeless tobacco. She reports that she does not drink alcohol or use drugs.  Allergies: No Known Allergies  Medications:  I have reviewed the  patient's current medications. Prior to Admission:  Prescriptions Prior to Admission  Medication Sig Dispense Refill Last Dose  . amitriptyline (ELAVIL) 50 MG tablet Take 50 mg by mouth daily.   02/01/2016 at 0824  . Ascorbic Acid (VITAMIN C) 1000 MG tablet Take 1,000 mg by mouth daily.   02/01/2016 at 0824  . aspirin EC 81 MG tablet Take 81 mg by mouth daily.   02/01/2016 at 0824  . B Complex-C (SUPER B COMPLEX PO) Take 1 tablet by mouth daily.    02/01/2016 at 0824  . Bilberry, Vaccinium myrtillus, (BILBERRY PO) Take 1 capsule by mouth daily. 160mg    02/01/2016 at 0824  . buPROPion (WELLBUTRIN XL) 300 MG 24 hr tablet Take 300 mg by mouth daily.   01/31/2016 at 1812  . calcium carbonate (OS-CAL) 600 MG TABS tablet Take 600 mg by mouth daily.   02/01/2016 at 0824  . Cholecalciferol (VITAMIN D3) 1000 UNITS CAPS Take 1,000 Units by mouth daily.   02/01/2016 at 0824  . clopidogrel (PLAVIX) 75 MG tablet Take 75 mg by mouth daily.   02/01/2016 at 0824  . Coenzyme Q10 (COQ10 PO) Take 1 capsule by mouth daily.    02/01/2016 at 0824  . docusate sodium (COLACE) 100 MG capsule Take 100 mg by mouth daily.   02/01/2016 at 0824  . GLUCOSAMINE-CHONDROITIN PO Take 1 tablet by  mouth 2 (two) times daily.    02/01/2016 at 0824  . insulin aspart protamine- aspart (NOVOLOG MIX 70/30) (70-30) 100 UNIT/ML injection Inject 100-120 Units into the skin 2 (two) times daily.    02/01/2016 at 0824  . insulin lispro (HUMALOG) 100 UNIT/ML injection Inject into the skin once. Sliding scale 0-99=0 units 100-149=2 units 150-199=3 units 200-249=4 units 250-299=5 units 300-349=6 units 350-399= 7 units 400-449=8 units   02/01/2016 at 1124  . losartan (COZAAR) 50 MG tablet Take 50 mg by mouth daily.   02/01/2016 at 0824  . magnesium oxide (MAG-OX) 400 MG tablet Take 400 mg by mouth 2 (two) times daily.   02/01/2016 at 0824  . meclizine (ANTIVERT) 25 MG tablet Take 1 tablet (25 mg total) by mouth 3 (three) times daily. 30 tablet 0 01/31/2016  at 1124  . Melatonin 10 MG TBDP Take 10 mg by mouth at bedtime.   01/31/2016 at 1949  . metFORMIN (GLUCOPHAGE) 500 MG tablet Take 500 mg by mouth 2 (two) times daily.    02/01/2016 at 0824  . Multiple Vitamins-Minerals (OCUVITE PO) Take 1 capsule by mouth daily.    02/01/2016 at 0824  . niacin 500 MG tablet Take 500 mg by mouth daily.    02/01/2016 at 0824  . Omega-3 Fatty Acids (FISH OIL) 1000 MG CPDR Take 1,200 mg by mouth daily.   02/01/2016 at 0824  . polyethylene glycol (MIRALAX / GLYCOLAX) packet Take 17 g by mouth daily.   02/01/2016 at 0824  . senna (SENOKOT) 8.6 MG TABS tablet Take 1 tablet by mouth 2 (two) times daily.   02/01/2016 at 0824  . Valerian 450 MG CAPS Take 1 tablet by mouth daily as needed (insomnia).    01/31/2016 at 1949  . vitamin E 400 UNIT capsule Take 400 Units by mouth daily.   02/01/2016 at 0824  . bisacodyl (DULCOLAX) 5 MG EC tablet Take 1 tablet (5 mg total) by mouth daily as needed for moderate constipation. (Patient not taking: Reported on 02/01/2016) 30 tablet 0 Not Taking at Unknown time  . collagenase (SANTYL) ointment Apply topically daily. (Patient not taking: Reported on 02/01/2016) 30 g 0 Not Taking at Unknown time  . doxycycline (VIBRA-TABS) 100 MG tablet Take 1 tablet (100 mg total) by mouth every 12 (twelve) hours. (Patient not taking: Reported on 02/01/2016) 14 tablet 0 Not Taking at Unknown time  . insulin aspart (NOVOLOG) 100 UNIT/ML injection Inject 0-15 Units into the skin 3 (three) times daily with meals. (Patient not taking: Reported on 02/01/2016) 10 mL 11 Not Taking at Unknown time  . insulin aspart (NOVOLOG) 100 UNIT/ML injection Inject 0-5 Units into the skin at bedtime. (Patient not taking: Reported on 02/01/2016) 10 mL 11 Not Taking at Unknown time  . naproxen sodium (ANAPROX) 220 MG tablet Take 220-440 mg by mouth 2 (two) times daily as needed (for pain).    Not Taking at Unknown time   Scheduled: . aspirin EC  81 mg Oral Daily  . buPROPion  300 mg Oral  QPM  . calcium carbonate  500 mg Oral TID  . clopidogrel  75 mg Oral Daily  . enoxaparin (LOVENOX) injection  40 mg Subcutaneous Q12H  . insulin aspart  0-5 Units Subcutaneous QHS  . insulin aspart  0-9 Units Subcutaneous TID WC  . losartan  50 mg Oral Daily  . meclizine  25 mg Oral TID  . niacin  500 mg Oral Daily  . omega-3 acid ethyl esters  2 g Oral Daily  . polyethylene glycol  17 g Oral Daily  . senna  1 tablet Oral BID  . vitamin C  1,000 mg Oral Daily  . vitamin E  400 Units Oral Daily    ROS: History obtained from the patient  General ROS: negative for - chills, fatigue, fever, night sweats, weight gain or weight loss Psychological ROS: negative for - behavioral disorder, hallucinations, memory difficulties, mood swings or suicidal ideation Ophthalmic ROS: negative for - blurry vision, double vision, eye pain or loss of vision ENT ROS: negative for - epistaxis, nasal discharge, oral lesions, sore throat, tinnitus or vertigo Allergy and Immunology ROS: negative for - hives or itchy/watery eyes Hematological and Lymphatic ROS: negative for - bleeding problems, bruising or swollen lymph nodes Endocrine ROS: negative for - galactorrhea, hair pattern changes, polydipsia/polyuria or temperature intolerance Respiratory ROS: negative for - cough, hemoptysis, shortness of breath or wheezing Cardiovascular ROS: negative for - chest pain, dyspnea on exertion, edema or irregular heartbeat Gastrointestinal ROS: negative for - abdominal pain, diarrhea, hematemesis, nausea/vomiting or stool incontinence Genito-Urinary ROS: negative for - dysuria, hematuria, incontinence or urinary frequency/urgency Musculoskeletal ROS: left foot pain Neurological ROS: as noted in HPI Dermatological ROS: negative for rash and skin lesion changes  Physical Examination: Blood pressure 133/79, pulse 88, temperature 98 F (36.7 C), temperature source Oral, resp. rate 18, height 5\' 6"  (1.676 m), weight 114.9  kg (253 lb 4.8 oz), SpO2 95 %.  HEENT-  Normocephalic, no lesions, without obvious abnormality.  Normal external eye and conjunctiva.  Normal TM's bilaterally.  Normal auditory canals and external ears. Normal external nose, mucus membranes and septum.  Normal pharynx. Cardiovascular- S1, S2 normal, pulses palpable throughout   Lungs- chest clear, no wheezing, rales, normal symmetric air entry Abdomen- soft, non-tender; bowel sounds normal; no masses,  no organomegaly Extremities- mild lower extremity edema Lymph-no adenopathy palpable Musculoskeletal-no joint tenderness, deformity or swelling Skin-warm and dry, no hyperpigmentation, vitiligo, or suspicious lesions  Neurological Examination Mental Status: Alert, oriented to name and place but does not know the year or the month.  Speech fluent although delayed without evidence of aphasia.  Able to follow 3 step commands without difficulty. Cranial Nerves: II: Discs flat bilaterally; Visual fields grossly normal, pupils equal, round, reactive to light and accommodation III,IV, VI: ptosis not present, extra-ocular motions intact bilaterally V,VII: decrease in right NLF, facial light touch sensation normal bilaterally VIII: hearing normal bilaterally IX,X: gag reflex present XI: bilateral shoulder shrug XII: midline tongue extension Motor: Right : Upper extremity   5/5    Left:     Upper extremity   0/5  Lower extremity   3/5     Lower extremity   3/5 Tone and bulk:normal tone throughout; no atrophy noted Sensory: Pinprick and light touch intact throughout, bilaterally Deep Tendon Reflexes: 2+ in the upper extremities, trace at the knee and absent at the ankles Plantars: Right: mute   Left: mute Cerebellar: Normal finger-to-nose testing with the RUE Gait: not tested due to safety concerns    Laboratory Studies:  Basic Metabolic Panel:  Recent Labs Lab 02/01/16 1406  NA 137  K 4.6  CL 98*  CO2 30  GLUCOSE 76  BUN 24*   CREATININE 0.93  CALCIUM 10.8*    Liver Function Tests:  Recent Labs Lab 02/01/16 1406  AST 36  ALT 42  ALKPHOS 66  BILITOT 0.9  PROT 7.7  ALBUMIN 3.8   No results for input(s): LIPASE,  AMYLASE in the last 168 hours. No results for input(s): AMMONIA in the last 168 hours.  CBC:  Recent Labs Lab 02/01/16 1406  WBC 12.6*  HGB 14.3  HCT 42.6  MCV 93.9  PLT 367    Cardiac Enzymes:  Recent Labs Lab 02/01/16 1406  TROPONINI <0.03    BNP: Invalid input(s): POCBNP  CBG:  Recent Labs Lab 02/01/16 1419 02/01/16 2141 02/02/16 0626 02/02/16 0735  GLUCAP 79 133* 105* 114*    Microbiology: Results for orders placed or performed during the hospital encounter of 02/01/16  MRSA PCR Screening     Status: None   Collection Time: 02/01/16 10:35 PM  Result Value Ref Range Status   MRSA by PCR NEGATIVE NEGATIVE Final    Comment:        The GeneXpert MRSA Assay (FDA approved for NASAL specimens only), is one component of a comprehensive MRSA colonization surveillance program. It is not intended to diagnose MRSA infection nor to guide or monitor treatment for MRSA infections.     Coagulation Studies:  Recent Labs  02/01/16 1406  LABPROT 12.6  INR 0.94    Urinalysis:  Recent Labs Lab 02/01/16 1436  COLORURINE AMBER*  LABSPEC 1.017  PHURINE 5.0  GLUCOSEU NEGATIVE  HGBUR NEGATIVE  BILIRUBINUR NEGATIVE  KETONESUR NEGATIVE  PROTEINUR 100*  NITRITE NEGATIVE  LEUKOCYTESUR NEGATIVE    Lipid Panel:    Component Value Date/Time   CHOL 150 02/02/2016 0501   TRIG 196 (H) 02/02/2016 0501   HDL 30 (L) 02/02/2016 0501   CHOLHDL 5.0 02/02/2016 0501   VLDL 39 02/02/2016 0501   LDLCALC 81 02/02/2016 0501    HgbA1C:  Lab Results  Component Value Date   HGBA1C 7.0 (H) 01/15/2016    Urine Drug Screen:     Component Value Date/Time   LABOPIA NONE DETECTED 02/01/2016 1436   LABOPIA NONE DETECTED 10/15/2006 1244   COCAINSCRNUR NONE DETECTED  02/01/2016 1436   LABBENZ NONE DETECTED 02/01/2016 1436   LABBENZ NONE DETECTED 10/15/2006 1244   AMPHETMU NONE DETECTED 02/01/2016 1436   AMPHETMU NONE DETECTED 10/15/2006 1244   THCU NONE DETECTED 02/01/2016 1436   THCU NONE DETECTED 10/15/2006 1244   LABBARB NONE DETECTED 02/01/2016 1436   LABBARB  10/15/2006 1244    NONE DETECTED        DRUG SCREEN FOR MEDICAL PURPOSES ONLY.  IF CONFIRMATION IS NEEDED FOR ANY PURPOSE, NOTIFY LAB WITHIN 5 DAYS.    Alcohol Level:  Recent Labs Lab 02/01/16 1406  ETH <5    Other results: EKG: sinus rhythm at 97 bpm.  Imaging: Ct Head Wo Contrast  Result Date: 02/01/2016 CLINICAL DATA:  Altered mental status. EXAM: CT HEAD WITHOUT CONTRAST TECHNIQUE: Contiguous axial images were obtained from the base of the skull through the vertex without intravenous contrast. COMPARISON:  01/14/2016 FINDINGS: Brain: Again noted is a subtle old infarct in the left cerebellar hemisphere. Changes compatibles with old infarct involving the posterior medial right temporal lobe and the right posterior internal capsule. Again noted is diffuse low density throughout the white matter. No evidence for acute hemorrhage, mass lesion, midline shift hydrocephalus or new large infarct. Vascular: Calcifications in the vertebral arteries. Skull: No calvarial fracture. Sinuses/Orbits: Visualized paranasal sinuses are clear. Other: None IMPRESSION: No acute intracranial abnormality. Old infarcts. Diffuse white matter disease probably represents chronic small vessel ischemic changes. Electronically Signed   By: Richarda Overlie M.D.   On: 02/01/2016 14:21   Mr Angiogram Head Wo Contrast  Result Date: 02/01/2016 CLINICAL DATA:  Initial evaluation for acute altered mental status, confusion. Worsening left-sided weakness. EXAM: MRI HEAD WITHOUT CONTRAST MRA HEAD WITHOUT CONTRAST TECHNIQUE: Multiplanar, multiecho pulse sequences of the brain and surrounding structures were obtained without  intravenous contrast. Angiographic images of the head were obtained using MRA technique without contrast. COMPARISON:  Prior CT from 02/01/2016 as well as previous MRI from 01/14/2016. FINDINGS: MRI HEAD FINDINGS Brain: Examination limited by motion artifact. Diffuse prominence of the CSF containing spaces is compatible with generalized cerebral atrophy. Patchy and confluent T2/FLAIR hyperintensity within the periventricular and deep white matter both cerebral hemispheres most consistent with chronic microvascular ischemic disease. Remote left cerebellar infarct present. Additional scattered remote lacunar infarcts within the bilateral basal ganglia. Encephalomalacia within the mesial right temporal lobe and right basal ganglia also like related to remote ischemia. Associated wallerian degeneration on the right. There is patchy diffusion abnormality within the cortical gray matter of the anterior left frontal lobe and adjacent white matter of the left corona radiata (series 100, image 36). This is new relative to most recent MRI, and is most consistent with acute/ early subacute ischemia. No associated hemorrhage or mass effect. No mass lesion, midline shift, or mass effect. No hydrocephalus. No extra-axial fluid collection. Major dural sinuses are grossly patent. Pituitary gland within normal limits. Vascular: Major intracranial vascular flow voids are maintained. Skull and upper cervical spine: Craniocervical junction normal. Visualized upper cervical spine demonstrates no acute abnormality. Diffusely decreased signal intensity on T1 weighted sequences within the vertebral body bone marrow, nonspecific, but most commonly related to anemia, smoking, or obesity. No scalp soft tissue abnormality. Sinuses/Orbits: Globes and orbital soft tissues demonstrate no acute abnormality. Patient is status post lens extraction bilaterally. Paranasal sinuses are clear. Trace opacity bilateral mastoid air cells. MRA HEAD FINDINGS  ANTERIOR CIRCULATION: Study degraded by motion artifact. Distal cervical segments of the internal carotid arteries are patent with antegrade flow. Petrous segments patent bilaterally. Scattered atheromatous irregularity within the cavernous ICAs without flow-limiting stenosis. A1 segments patent. Anterior communicating artery normal in. Anterior cerebral arteries demonstrate multifocal atheromatous irregularity but are patent to their distal aspects. M1 segments patent without occlusion. Moderate smooth narrowing proximal left M1 segment. Right M1 segment patent without stenosis. Distal MCA branches somewhat poorly evaluated on this motion degraded study, but are relatively symmetric. Distal small vessel atheromatous irregularity present. POSTERIOR CIRCULATION: Vertebral arteries patent to the vertebrobasilar junction. Basilar artery demonstrates multifocal irregularity with mild to moderate stenoses. Superior cerebellar arteries patent proximally. Both of the posterior cerebral artery supplied via the basilar artery and are patent to their distal aspects. Multifocal atheromatous irregularity within the P2 segments, right greater than left with mild to moderate stenoses. No aneurysm or vasculopathy. IMPRESSION: MRI HEAD IMPRESSION: 1. Patchy acute/subacute ischemic infarcts involving the anterior left frontal lobe as above. No associated hemorrhage or mass effect. 2. Otherwise stable atrophy with extensive chronic microvascular ischemic disease and sequela of remote ischemia as above. MRA HEAD IMPRESSION: 1. Negative intracranial MRA for large or proximal arterial branch occlusion. 2. Moderate smooth stenosis of the proximal left M1 segment. Additional atheromatous irregularity throughout the anterior circulation without high-grade stenosis. 3. Moderate multifocal atheromatous irregularity with a dx subacute course he is having weakness or wrong side although it AE stenoses involving the basilar artery. Additional  atheromatous irregularity involving the PCAs bilaterally, right greater than left. Electronically Signed   By: Rise MuBenjamin  McClintock M.D.   On: 02/01/2016 19:04   Mr Brain Wo Contrast  Result Date: 02/01/2016 CLINICAL DATA:  Initial evaluation for acute altered mental status, confusion. Worsening left-sided weakness. EXAM: MRI HEAD WITHOUT CONTRAST MRA HEAD WITHOUT CONTRAST TECHNIQUE: Multiplanar, multiecho pulse sequences of the brain and surrounding structures were obtained without intravenous contrast. Angiographic images of the head were obtained using MRA technique without contrast. COMPARISON:  Prior CT from 02/01/2016 as well as previous MRI from 01/14/2016. FINDINGS: MRI HEAD FINDINGS Brain: Examination limited by motion artifact. Diffuse prominence of the CSF containing spaces is compatible with generalized cerebral atrophy. Patchy and confluent T2/FLAIR hyperintensity within the periventricular and deep white matter both cerebral hemispheres most consistent with chronic microvascular ischemic disease. Remote left cerebellar infarct present. Additional scattered remote lacunar infarcts within the bilateral basal ganglia. Encephalomalacia within the mesial right temporal lobe and right basal ganglia also like related to remote ischemia. Associated wallerian degeneration on the right. There is patchy diffusion abnormality within the cortical gray matter of the anterior left frontal lobe and adjacent white matter of the left corona radiata (series 100, image 36). This is new relative to most recent MRI, and is most consistent with acute/ early subacute ischemia. No associated hemorrhage or mass effect. No mass lesion, midline shift, or mass effect. No hydrocephalus. No extra-axial fluid collection. Major dural sinuses are grossly patent. Pituitary gland within normal limits. Vascular: Major intracranial vascular flow voids are maintained. Skull and upper cervical spine: Craniocervical junction normal.  Visualized upper cervical spine demonstrates no acute abnormality. Diffusely decreased signal intensity on T1 weighted sequences within the vertebral body bone marrow, nonspecific, but most commonly related to anemia, smoking, or obesity. No scalp soft tissue abnormality. Sinuses/Orbits: Globes and orbital soft tissues demonstrate no acute abnormality. Patient is status post lens extraction bilaterally. Paranasal sinuses are clear. Trace opacity bilateral mastoid air cells. MRA HEAD FINDINGS ANTERIOR CIRCULATION: Study degraded by motion artifact. Distal cervical segments of the internal carotid arteries are patent with antegrade flow. Petrous segments patent bilaterally. Scattered atheromatous irregularity within the cavernous ICAs without flow-limiting stenosis. A1 segments patent. Anterior communicating artery normal in. Anterior cerebral arteries demonstrate multifocal atheromatous irregularity but are patent to their distal aspects. M1 segments patent without occlusion. Moderate smooth narrowing proximal left M1 segment. Right M1 segment patent without stenosis. Distal MCA branches somewhat poorly evaluated on this motion degraded study, but are relatively symmetric. Distal small vessel atheromatous irregularity present. POSTERIOR CIRCULATION: Vertebral arteries patent to the vertebrobasilar junction. Basilar artery demonstrates multifocal irregularity with mild to moderate stenoses. Superior cerebellar arteries patent proximally. Both of the posterior cerebral artery supplied via the basilar artery and are patent to their distal aspects. Multifocal atheromatous irregularity within the P2 segments, right greater than left with mild to moderate stenoses. No aneurysm or vasculopathy. IMPRESSION: MRI HEAD IMPRESSION: 1. Patchy acute/subacute ischemic infarcts involving the anterior left frontal lobe as above. No associated hemorrhage or mass effect. 2. Otherwise stable atrophy with extensive chronic microvascular  ischemic disease and sequela of remote ischemia as above. MRA HEAD IMPRESSION: 1. Negative intracranial MRA for large or proximal arterial branch occlusion. 2. Moderate smooth stenosis of the proximal left M1 segment. Additional atheromatous irregularity throughout the anterior circulation without high-grade stenosis. 3. Moderate multifocal atheromatous irregularity with a dx subacute course he is having weakness or wrong side although it AE stenoses involving the basilar artery. Additional atheromatous irregularity involving the PCAs bilaterally, right greater than left. Electronically Signed   By: Rise Mu M.D.   On: 02/01/2016 19:04    Assessment: 70 y.o. female  presenting with altered mental status.  MRI of the brain personally reviewed and shows a patchy, acute left frontal infarct.  MRA shows moderate left M1 stenosis with multifocal areas of irregularity throughout.   Infarct likely secondary to small vessel disease.  Repeat stroke work up not indicated at this time.  All testing done about 2 weeks ago with previous admission.  Patient may benefit from long term cardiac monitoring with LINQ since embolic etiology remains a possibility.  Carotid dopplers show no evidence of hemodynamically significant stenosis.  Echocardiogram shows no cardiac source of emboli with an EF of 60%.  A1c 7.1, LDL 81.     Stroke Risk Factors - diabetes mellitus, hyperlipidemia and hypertension  Plan: 1. Schedule for holter/LINQ placement 2. Prophylactic therapy-Continue ASA and Plavix 3. NPO until RN stroke swallow screen 4. Telemetry monitoring 5. Frequent neuro checks 6. Statin for lipid management with target LDL<70. 7. Improved blood sugar management 8. EEG   Thana Farr, MD Neurology [phone] 02/02/2016, 10:19 AM

## 2016-02-02 NOTE — Consult Note (Signed)
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  Patient's husband reports that patient first had this wound nearly 2 years ago, was treated at the outpatient wound care center and that wound healed.  Approximately two weeks ago, MD told patient's husband that patient had "reinjured" her foot. Asked today to assess and provide wound care recommendations. Patient rotates laterally and pressure is applied to the injured tissue while at rest. Patient with charcot foot deformity. Wound type: Neuropathic, infectious, pressure Pressure Ulcer POA: Yes Measurement: Open full thickness area measures 2cm x 3cm x 0.1cm.  Proximal to wound is an area of erythema (non-blanching and warm) that measures 4cm x 6cm. Wound bed: red, moist, smooth (non-granulating) Drainage (amount, consistency, odor) Moderate amount serous drainage, no odor. Periwound:As described above. Dressing procedure/placement/frequency: I will provide Nursing with guidance via the Orders for twice daily wound care using xeroform gauze, selected both for its astringent and antimicrobial properties. I suspect that patient may require antibiotics and Orthopedic assessment and POC.  If you agree, please order consult.  WOC nursing team will not follow, but will remain available to this patient, the nursing and medical teams.  Please re-consult if needed. Thanks, Ladona MowLaurie Synda Bagent, MSN, RN, GNP, Hans EdenCWOCN, CWON-AP, FAAN  Pager# [phone]
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
## 2016-02-03 ENCOUNTER — Inpatient Hospital Stay: Payer: Medicare HMO

## 2016-02-03 DIAGNOSIS — I639 Cerebral infarction, unspecified: Secondary | ICD-10-CM

## 2016-02-03 DIAGNOSIS — R4182 Altered mental status, unspecified: Secondary | ICD-10-CM

## 2016-02-03 LAB — BASIC METABOLIC PANEL
ANION GAP: 8 (ref 5–15)
BUN: 20 mg/dL (ref 6–20)
CALCIUM: 9.7 mg/dL (ref 8.9–10.3)
CHLORIDE: 98 mmol/L — AB (ref 101–111)
CO2: 32 mmol/L (ref 22–32)
Creatinine, Ser: 1.08 mg/dL — ABNORMAL HIGH (ref 0.44–1.00)
GFR calc non Af Amer: 51 mL/min — ABNORMAL LOW (ref >60)
GFR, EST AFRICAN AMERICAN: 59 mL/min — AB (ref >60)
Glucose, Bld: 190 mg/dL — ABNORMAL HIGH (ref 65–99)
POTASSIUM: 4.2 mmol/L (ref 3.5–5.1)
Sodium: 138 mmol/L (ref 135–145)

## 2016-02-03 LAB — GLUCOSE, CAPILLARY
GLUCOSE-CAPILLARY: 185 mg/dL — AB (ref 65–99)
GLUCOSE-CAPILLARY: 168 mg/dL — AB (ref 65–99)
Glucose-Capillary: 192 mg/dL — ABNORMAL HIGH (ref 65–99)
Glucose-Capillary: 224 mg/dL — ABNORMAL HIGH (ref 65–99)

## 2016-02-03 LAB — CBC
HEMATOCRIT: 39.5 % (ref 35.0–47.0)
HEMOGLOBIN: 13.4 g/dL (ref 12.0–16.0)
MCH: 32 pg (ref 26.0–34.0)
MCHC: 34 g/dL (ref 32.0–36.0)
MCV: 94.1 fL (ref 80.0–100.0)
Platelets: 304 10*3/uL (ref 150–440)
RBC: 4.2 MIL/uL (ref 3.80–5.20)
RDW: 14.4 % (ref 11.5–14.5)
WBC: 9.6 10*3/uL (ref 3.6–11.0)

## 2016-02-03 LAB — HEMOGLOBIN A1C
Hgb A1c MFr Bld: 6.7 % — ABNORMAL HIGH (ref 4.8–5.6)
Mean Plasma Glucose: 146 mg/dL

## 2016-02-03 MED ORDER — INSULIN ASPART PROT & ASPART (70-30 MIX) 100 UNIT/ML ~~LOC~~ SUSP: 15.0000 [IU] | Freq: Two times a day (BID) | SUBCUTANEOUS | 11 refills | Status: DC

## 2016-02-03 MED ORDER — ATORVASTATIN CALCIUM 40 MG PO TABS: 40.0000 mg | ORAL_TABLET | Freq: Every day | ORAL | 2 refills | Status: DC

## 2016-02-03 NOTE — Progress Notes (Addendum)
Patient medically stable for discharge today per MD.  Patient's plan is to [facility]. Call placed to SNF:  [facility] in effort to notify of DC, message left.  Patient off unit at this time getting an EEG. Will also await call back from SNF to ensure DC plan.  LCSW spoke with facility, [facility] and not yet transistioned over to LTC, thus facility is sending in all information for new authorization.  Facility feels they should receive the authorization today, but will need this before she returns.  They will call once authorization has been reviewed and obtained.  LCSW has sent all paperwork to facility. Will continue to assist with discharge and make contact with family. Most likely will transfer by EMS.  Molly EmoryHannah Konstantinos Cordoba LCSW, MSW Clinical Social Work: Optician, dispensingystem Wide Float Coverage for :

## 2016-02-03 NOTE — NC FL2 (Signed)
[place] MEDICAID FL2 LEVEL OF CARE SCREENING TOOL     IDENTIFICATION  Patient Name: Molly HughsBrenda P Openshaw Birthdate: 1945/09/09 Sex: female Admission Date (Current Location): 02/01/2016  [place]   Facility and Address:  [facility], [address], [place], [place] [address]      Provider Number: [id number]  Attending Physician Name and Address:  Enid Baasadhika Kalisetti, MD  Relative Name and Phone Number:  [name],Frio Spouse   [phone]     Current Level of Care: Hospital Recommended Level of Care: Skilled Nursing Facility Prior Approval Number:    Date Approved/Denied:   PASRR Number: [id number]  Discharge Plan: SNF    Current Diagnoses: Patient Active Problem List   Diagnosis Date Noted  . 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 Pressure injury of skin 01/15/2016  . Dizziness 01/14/2016  . Cellulitis of left lower extremity 11/19/2014  . COPD (chronic obstructive pulmonary disease) (HCC) 11/19/2014  . HTN (hypertension) 11/19/2014  . HLD (hyperlipidemia) 11/19/2014  . Sepsis (HCC) 11/19/2014  . Cellulitis of left foot 05/21/2014  . Diabetes mellitus (HCC) 05/21/2014  . Chronic anemia 05/21/2014  . Osteomyelitis of left foot (HCC) 05/21/2014  . Foot ulcer (HCC) 02/04/2014    Orientation RESPIRATION BLADDER Height & Weight     Self, Time, Situation, Place  Normal Incontinent Weight: 253 lb 4.8 oz (114.9 kg) Height:  5\' 6"  (167.6 cm)  BEHAVIORAL SYMPTOMS/MOOD NEUROLOGICAL BOWEL NUTRITION STATUS      Continent Diet (regular)  AMBULATORY STATUS COMMUNICATION OF NEEDS Skin   Limited Assist Verbally PU Stage and Appropriate Care PU Stage 1 Dressing: Daily (Unstageable - Full thickness tissue loss in which the base of the ulcer is covered by slough (yellow, tan, gray, green or brown) and/or eschar (tan, brown or black) in the wound bed.   ) PU Stage 2 Dressing: No Dressing PU Stage 3 Dressing: No Dressing PU Stage 4 Dressing:  No Dressing               Personal Care Assistance Level of Assistance  Bathing, Feeding, Dressing Bathing Assistance: Limited assistance Feeding assistance: Limited assistance Dressing Assistance: Limited assistance     Functional Limitations Info  Sight, Hearing, Speech Sight Info: Adequate Hearing Info: Adequate Speech Info: Adequate    SPECIAL CARE FACTORS FREQUENCY  PT (By licensed PT)     PT Frequency: 5x week              Contractures Contractures Info: Not present    Additional Factors Info  Code Status, Allergies, Psychotropic Code Status Info: Full Code Allergies Info: NKA Psychotropic Info: see MAR         Current Medications (02/03/2016):  This is the current hospital active medication list Current Facility-Administered Medications  Medication Dose Route Frequency Provider Last Rate Last Dose  . aspirin EC tablet 81 mg  81 mg Oral Daily Auburn BilberryShreyang Patel, MD   81 mg at 02/03/16 0945  . atorvastatin (LIPITOR) tablet 40 mg  40 mg Oral q1800 Enid Baasadhika Kalisetti, MD   40 mg at 02/02/16 1653  . bisacodyl (DULCOLAX) EC tablet 5 mg  5 mg Oral Daily PRN Auburn BilberryShreyang Patel, MD      . buPROPion (WELLBUTRIN XL) 24 hr tablet 300 mg  300 mg Oral QPM Auburn BilberryShreyang Patel, MD   300 mg at 02/02/16 1653  . calcium carbonate (TUMS - dosed in mg elemental calcium) chewable tablet 500 mg  500 mg Oral TID Auburn BilberryShreyang Patel, MD  500 mg at 02/03/16 0946  . clopidogrel (PLAVIX) tablet 75 mg  75 mg Oral Daily Auburn BilberryShreyang Patel, MD   75 mg at 02/03/16 0945  . enoxaparin (LOVENOX) injection 40 mg  40 mg Subcutaneous Q12H Auburn BilberryShreyang Patel, MD   40 mg at 02/03/16 0945  . insulin aspart (novoLOG) injection 0-5 Units  0-5 Units Subcutaneous QHS Oralia Manisavid Willis, MD      . insulin aspart (novoLOG) injection 0-9 Units  0-9 Units Subcutaneous TID WC Oralia Manisavid Willis, MD   2 Units at 02/03/16 [phone]  . losartan (COZAAR) tablet 50 mg  50 mg Oral Daily Auburn BilberryShreyang Patel, MD   50 mg at 02/03/16 0945  . meclizine (ANTIVERT)  tablet 25 mg  25 mg Oral TID Auburn BilberryShreyang Patel, MD   25 mg at 02/03/16 0944  . naproxen (NAPROSYN) tablet 250 mg  250 mg Oral BID PRN Auburn BilberryShreyang Patel, MD      . niacin tablet 500 mg  500 mg Oral Daily Auburn BilberryShreyang Patel, MD   500 mg at 02/03/16 0945  . omega-3 acid ethyl esters (LOVAZA) capsule 2 g  2 g Oral Daily Auburn BilberryShreyang Patel, MD   2 g at 02/03/16 0946  . polyethylene glycol (MIRALAX / GLYCOLAX) packet 17 g  17 g Oral Daily Auburn BilberryShreyang Patel, MD   17 g at 02/03/16 0945  . senna (SENOKOT) tablet 8.6 mg  1 tablet Oral BID Auburn BilberryShreyang Patel, MD   8.6 mg at 02/03/16 0944  . vitamin C (ASCORBIC ACID) tablet 1,000 mg  1,000 mg Oral Daily Auburn BilberryShreyang Patel, MD   1,000 mg at 02/03/16 0945  . vitamin E capsule 400 Units  400 Units Oral Daily Auburn BilberryShreyang Patel, MD   400 Units at 02/03/16 0945     Discharge Medications: Please see discharge summary for a list of discharge medications.  Relevant Imaging Results:  Relevant Lab Results:   Additional Information SSN [id number]  wound covered in slough, pts husband states [place] wound care sees patient and wound looks better than before  (LEFT FOOT)  Sophya Vanblarcom, Evlyn CourierHannah N, LCSW

## 2016-02-03 NOTE — Progress Notes (Signed)
Chaplain was making his rounds and visited with pt in room 122. Provided emotional support and a pastoral presence.    02/03/16 1220  Clinical Encounter Type  Visited With Patient;Patient and family together  Visit Type Initial  Referral From Nurse  Spiritual Encounters  Spiritual Needs Emotional

## 2016-02-03 NOTE — Clinical Social Work Note (Addendum)
MSW received phone call from [facility] who said they need OT notes before [facility] will be able to authorize patient to go to SNF for short term rehab.  [facility] will contact MSW once authorization has been received.  4:45pm  SNF has not received authorization for patient to return yet.  MSW contacted physician to ask if she can cancel discharge.  SNF should have insurance approval on Tuesday, MSW to continue to follow patient's progress throughout discharge planning.  Ervin KnackEric R. Xavyer Steenson, MSW [phone]  Mon-Fri 8a-4:30p 02/03/2016 3:12 PM

## 2016-02-03 NOTE — Discharge Summary (Signed)
[facility] [place] [facility]   PATIENT NAME: Molly [name]    MR#:  [id number]  DATE OF BIRTH:  1945/06/11  DATE OF ADMISSION:  02/01/2016   ADMITTING PHYSICIAN: Auburn Bilberry, MD  DATE OF DISCHARGE: 02/03/16  PRIMARY CARE PHYSICIAN: Elita Boone, MD   ADMISSION DIAGNOSIS:   Altered mental status, unspecified altered mental status type [R41.82] Acute cerebrovascular accident (CVA) (HCC) [I63.9]  DISCHARGE DIAGNOSIS:   Active Problems:   CVS disease   SECONDARY DIAGNOSIS:   Past Medical History:  Diagnosis Date  . Anxiety   . COPD (chronic obstructive pulmonary disease) (HCC)   . Diabetes (HCC)   . Hemorrhoid   . HLD (hyperlipidemia)   . HTN (hypertension)   . Stroke [facility]) 2008   left weakness    HOSPITAL COURSE:    70 year old female with past medical history significant for stroke with left arm weakness, COPD, hypertension, diabetes mellitus, left foot osteomyelitis status post lateral toes amputation, COPD presents to hospital from nursing home secondary to worsening confusion.  #1 acute CVA-MRI of the brain showing acute patchy ischemic infarcts involving left frontal lobe. Also chronic microvascular ischemic disease changes. -Appreciate neurology consult. Continue aspirin and Plavix. Started statin. -Holter monitor to look for any arrhythmias due to possibility of embolic stroke. Outpatient follow up -Continue physical therapy, occupational therapy. -Recent further stroke workup including Dopplers, echo done with no significant findings. No need to repeat stroke workup.  #2 acute encephalopathy-likely secondary to stroke. No UTI noted. -Improving mental status and back to baseline -EEG pending  #3 hypertension-on losartan  #4 diabetes mellitus- 70/30 insulin and SSI as outpatient as well Discontinue metformin. Insulin dose has been adjusted  #5 GERD- protonix  Will be discharged back to rehab  today   DISCHARGE CONDITIONS:   Guarded  CONSULTS OBTAINED:   Treatment Team:  Thana Farr, MD Kym Groom, MD  DRUG ALLERGIES:   No Known Allergies DISCHARGE MEDICATIONS:     Medication List    STOP taking these medications   BILBERRY PO   doxycycline 100 MG tablet Commonly known as:  VIBRA-TABS   insulin aspart 100 UNIT/ML injection Commonly known as:  novoLOG   metFORMIN 500 MG tablet Commonly known as:  GLUCOPHAGE   Valerian 450 MG Caps     TAKE these medications   amitriptyline 50 MG tablet Commonly known as:  ELAVIL Take 50 mg by mouth daily.   aspirin EC 81 MG tablet Take 81 mg by mouth daily.   atorvastatin 40 MG tablet Commonly known as:  LIPITOR Take 1 tablet (40 mg total) by mouth daily at 6 PM.   bisacodyl 5 MG EC tablet Commonly known as:  DULCOLAX Take 1 tablet (5 mg total) by mouth daily as needed for moderate constipation.   buPROPion 300 MG 24 hr tablet Commonly known as:  WELLBUTRIN XL Take 300 mg by mouth daily.   calcium carbonate 600 MG Tabs tablet Commonly known as:  OS-CAL Take 600 mg by mouth daily.   clopidogrel 75 MG tablet Commonly known as:  PLAVIX Take 75 mg by mouth daily.   collagenase ointment Commonly known as:  SANTYL Apply topically daily.   COQ10 PO Take 1 capsule by mouth daily.   docusate sodium 100 MG capsule Commonly known as:  COLACE Take 100 mg by mouth daily.   Fish Oil 1000 MG Cpdr Take 1,200 mg by mouth daily.   GLUCOSAMINE-CHONDROITIN PO Take 1 tablet by mouth 2 (  two) times daily.   insulin aspart protamine- aspart (70-30) 100 UNIT/ML injection Commonly known as:  NOVOLOG MIX 70/30 Inject 0.15 mLs (15 Units total) into the skin 2 (two) times daily. What changed:  how much to take   insulin lispro 100 UNIT/ML injection Commonly known as:  HUMALOG Inject into the skin once. Sliding scale 0-99=0 units 100-149=2 units 150-199=3 units 200-249=4 units 250-299=5 units 300-349=6  units 350-399= 7 units 400-449=8 units   losartan 50 MG tablet Commonly known as:  COZAAR Take 50 mg by mouth daily.   magnesium oxide 400 MG tablet Commonly known as:  MAG-OX Take 400 mg by mouth 2 (two) times daily.   meclizine 25 MG tablet Commonly known as:  ANTIVERT Take 1 tablet (25 mg total) by mouth 3 (three) times daily.   Melatonin 10 MG Tbdp Take 10 mg by mouth at bedtime.   naproxen sodium 220 MG tablet Commonly known as:  ANAPROX Take 220-440 mg by mouth 2 (two) times daily as needed (for pain).   niacin 500 MG tablet Take 500 mg by mouth daily.   OCUVITE PO Take 1 capsule by mouth daily.   polyethylene glycol packet Commonly known as:  MIRALAX / GLYCOLAX Take 17 g by mouth daily.   senna 8.6 MG Tabs tablet Commonly known as:  SENOKOT Take 1 tablet by mouth 2 (two) times daily.   SUPER B COMPLEX PO Take 1 tablet by mouth daily.   vitamin C 1000 MG tablet Take 1,000 mg by mouth daily.   Vitamin D3 1000 units Caps Take 1,000 Units by mouth daily.   vitamin E 400 UNIT capsule Take 400 Units by mouth daily.        DISCHARGE INSTRUCTIONS:   1. Neurology f/u in 2 weeks 2. PCP f/u in 1-2 weeks  DIET:   Cardiac diet  ACTIVITY:   Activity as tolerated  OXYGEN:   Home Oxygen: No.  Oxygen Delivery: room air  DISCHARGE LOCATION:   nursing home   If you experience worsening of your admission symptoms, develop shortness of breath, life threatening emergency, suicidal or homicidal thoughts you must seek medical attention immediately by calling 911 or calling your MD immediately  if symptoms less severe.  You Must read complete instructions/literature along with all the possible adverse reactions/side effects for all the Medicines you take and that have been prescribed to you. Take any new Medicines after you have completely understood and accpet all the possible adverse reactions/side effects.   Please note  You were cared for by a  hospitalist during your hospital stay. If you have any questions about your discharge medications or the care you received while you were in the hospital after you are discharged, you can call the unit and asked to speak with the hospitalist on call if the hospitalist that took care of you is not available. Once you are discharged, your primary care physician will handle any further medical issues. Please note that NO REFILLS for any discharge medications will be authorized once you are discharged, as it is imperative that you return to your primary care physician (or establish a relationship with a primary care physician if you do not have one) for your aftercare needs so that they can reassess your need for medications and monitor your lab values.    On the day of Discharge:  VITAL SIGNS:   Blood pressure 135/80, pulse 94, temperature 98 F (36.7 C), temperature source Oral, resp. rate 18, height 5\' 6"  (  1.676 m), weight 114.9 kg (253 lb 4.8 oz), SpO2 95 %.  PHYSICAL EXAMINATION:   GENERAL:  70 y.o.-year-old Obese patient lying in the bed with no acute distress.  EYES: Pupils equal, round, reactive to light and accommodation. No scleral icterus. Extraocular muscles intact.  HEENT: Head atraumatic, normocephalic. Oropharynx and nasopharynx clear.  NECK:  Supple, no jugular venous distention. No thyroid enlargement, no tenderness.  LUNGS: Normal breath sounds bilaterally, no wheezing, rales,rhonchi or crepitation. No use of accessory muscles of respiration.  By basilar breath sounds CARDIOVASCULAR: S1, S2 normal. No murmurs, rubs, or gallops.  ABDOMEN: Soft, nontender, nondistended. Bowel sounds present. No organomegaly or mass.  EXTREMITIES: No pedal edema, cyanosis, or clubbing. Wound on the left foot lateral side, s/p amputation of lateral 2 digits NEUROLOGIC: Cranial nerves II through XII are intact. Muscle strength 5/5 in all extremities except left upper extremity where the strength is 3/5.  Decreased sensation in both feet due to neuropathy. No new changes in sensation.. Gait not checked.  PSYCHIATRIC: The patient is alert and oriented x 1-2  SKIN: No obvious rash, lesion, or ulcer.   DATA REVIEW:   CBC  Recent Labs Lab 02/03/16 0529  WBC 9.6  HGB 13.4  HCT 39.5  PLT 304    Chemistries   Recent Labs Lab 02/01/16 1406 02/03/16 0529  NA 137 138  K 4.6 4.2  CL 98* 98*  CO2 30 32  GLUCOSE 76 190*  BUN 24* 20  CREATININE 0.93 1.08*  CALCIUM 10.8* 9.7  AST 36  --   ALT 42  --   ALKPHOS 66  --   BILITOT 0.9  --      Microbiology Results  Results for orders placed or performed during the hospital encounter of 02/01/16  MRSA PCR Screening     Status: None   Collection Time: 02/01/16 10:35 PM  Result Value Ref Range Status   MRSA by PCR NEGATIVE NEGATIVE Final    Comment:        The GeneXpert MRSA Assay (FDA approved for NASAL specimens only), is one component of a comprehensive MRSA colonization surveillance program. It is not intended to diagnose MRSA infection nor to guide or monitor treatment for MRSA infections.     RADIOLOGY:  No results found.   Management plans discussed with the patient, family and they are in agreement.  CODE STATUS:     Code Status Orders        Start     Ordered   02/01/16 2118  Full code  Continuous     02/01/16 2117    Code Status History    Date Active Date Inactive Code Status Order ID Comments User Context   01/14/2016  2:01 PM 01/16/2016 10:22 PM Full Code [id number]  Katha HammingSnehalatha Konidena, MD ED   11/19/2014 10:40 PM 11/21/2014  8:55 PM Full Code [id number]  Oralia Manisavid Willis, MD Inpatient   05/22/2014  6:31 PM 05/25/2014  8:20 PM Full Code [id number]  Toni ArthursJohn Hewitt, MD Inpatient   05/21/2014 10:31 PM 05/22/2014  6:31 PM Full Code [id number]  Yevonne PaxSaadat A Khan, MD Inpatient      TOTAL TIME TAKING CARE OF THIS PATIENT: 39 minutes.    Enid BaasKALISETTI,Eevee Borbon M.D on 02/03/2016 at 12:15 PM  Between 7am to 6pm - Pager -  [phone]  After 6pm go to [email or web address] - Social research officer, governmentpassword [email or web address] [facility]  [facility] [facility]  Office  [phone]  CC: Primary care physician; Elita Booneoberts, Caroline C, MD   Note:  This dictation was prepared with Dragon dictation along with smaller phrase technology. Any transcriptional errors that result from this process are unintentional.

## 2016-02-03 NOTE — Progress Notes (Addendum)
SLP Cancellation Note  Patient Details Name: Molly [name] MRN: [id number] DOB: [birth year]   Cancelled treatment:       Reason Eval/Treat Not Completed: SLP screened, no needs identified, will sign off (No reports of Dysphagia per NSG, pt. No f/u indicated.) Reviewed chart notes; consulted NSG.  Pt is leaving the unit for tests at this time but was able to vebally converse w/ SLP to give information re: swallowing as well as her speech and communication. Pt was able to communicate her thoughts and responses adequately to SLP, NSG. Pt's speech was intelligible. Recommend when pt returns to Rehab that she receive a formal Cognitive-Linguistic screening and/or assessment if deficits are suspected. Pt and NSG agreed.   Jerilynn SomKatherine Audri Kozub, MS, CCC-SLP Hanaa Payes 02/03/2016, 12:33 PM
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
## 2016-02-04 LAB — GLUCOSE, CAPILLARY
Glucose-Capillary: 203 mg/dL — ABNORMAL HIGH (ref 65–99)
Glucose-Capillary: 183 mg/dL — ABNORMAL HIGH (ref 65–99)
Glucose-Capillary: 252 mg/dL — ABNORMAL HIGH (ref 65–99)
Glucose-Capillary: 258 mg/dL — ABNORMAL HIGH (ref 65–99)

## 2016-02-04 MED ORDER — INSULIN ASPART PROT & ASPART (70-30 MIX) 100 UNIT/ML ~~LOC~~ SUSP: 15.0000 [IU] | Freq: Two times a day (BID) | SUBCUTANEOUS | 11 refills | Status: DC

## 2016-02-04 MED ORDER — HALOPERIDOL LACTATE 5 MG/ML IJ SOLN
2.5000 mg | Freq: Once | INTRAMUSCULAR | Status: AC
  Administered 2016-02-05: 01:00:00 2.5 mg via INTRAVENOUS
  Filled 2016-02-04: qty 1

## 2016-02-04 NOTE — Progress Notes (Addendum)
2:08 PM LCSW continues to await for authorization for SNF. Call placed to admission Gavin PoundDeborah and she reports she has called [employer] and awaiting call back but hopefully will have something soon. All paperwork is completed for discharge only barrier remains to be authorization.   [facility]  Barrier:  Still needing Administrator, [employer]Humana Authorization and OT note requested by insurance has been sent to facility and Francine GravenHumana is reviewing.  Facility to call and let LCSW know once insurance has been approved. Will complete DC as soon as insurance barrier is resolved.  Molly EmoryHannah Josphine Laffey LCSW, MSW Clinical Social Work: Optician, dispensingystem Wide Float Coverage for :  [phone]

## 2016-02-04 NOTE — Clinical Social Work Note (Signed)
M[name] at [facility] contacted CSW and stated that patient's case went to medical director for review. At this time, no authorization determination has been received. York SpanielMonica Lynnette Pote MSW,LCSW [phone]

## 2016-02-04 NOTE — Discharge Summary (Signed)
[facility] [place] [facility]   PATIENT NAME: Molly [name]    MR#:  [id number]  DATE OF BIRTH:  [birth year]  DATE OF ADMISSION:  02/01/2016   ADMITTING PHYSICIAN: Auburn BilberryShreyang Patel, MD  DATE OF DISCHARGE: 02/04/16  PRIMARY CARE PHYSICIAN: Elita Booneoberts, Caroline C, MD   ADMISSION DIAGNOSIS:   Altered mental status, unspecified altered mental status type [R41.82] Acute cerebrovascular accident (CVA) (HCC) [I63.9]  DISCHARGE DIAGNOSIS:   Active Problems:   CVS disease   Acute cerebrovascular accident (CVA) (HCC)   Altered mental status   SECONDARY DIAGNOSIS:   Past Medical History:  Diagnosis Date  . Anxiety   . COPD (chronic obstructive pulmonary disease) (HCC)   . Diabetes (HCC)   . Hemorrhoid   . HLD (hyperlipidemia)   . HTN (hypertension)   . 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  #1 acute CVA-MRI of the brain showing acute patchy ischemic infarcts involving left frontal lobe. Also chronic microvascular ischemic disease changes. -Appreciate neurology consult. Continue aspirin and Plavix. Started statin. -Holter monitor to look for any arrhythmias due to possibility of embolic stroke. Outpatient follow up -Continue physical therapy, occupational therapy. -Recent further stroke workup including Dopplers, echo done with no significant findings. No need to repeat stroke workup. - affected her memory, judgement and also has fluctuating mental status  #2 acute encephalopathy-likely secondary to stroke. No UTI noted. -Improving mental status and back to baseline -EEG With generalized slowing. No epileptic activity noted  #3 hypertension-on losartan  #4  diabetes mellitus- 70/30 insulin- dose has been adjusted and SSI as outpatient as well Discontinue metformin.  #5 GERD- protonix  Will be discharged back to rehab today. Awaiting insurance authorization   DISCHARGE CONDITIONS:   Guarded  CONSULTS OBTAINED:   Treatment Team:  Thana FarrLeslie Reynolds, MD Kym GroomNeuro1 [facility], MD  DRUG ALLERGIES:   No Known Allergies DISCHARGE MEDICATIONS:     Medication List    STOP taking these medications   BILBERRY PO   doxycycline 100 MG tablet Commonly known as:  VIBRA-TABS   insulin aspart 100 UNIT/ML injection Commonly known as:  novoLOG   metFORMIN 500 MG tablet Commonly known as:  GLUCOPHAGE   Valerian 450 MG Caps     TAKE these medications   amitriptyline 50 MG tablet Commonly known as:  ELAVIL Take 50 mg by mouth daily.   aspirin EC 81 MG tablet Take 81 mg by mouth daily.   atorvastatin 40 MG tablet Commonly known as:  LIPITOR Take 1 tablet (40 mg total) by mouth daily at 6 PM.   bisacodyl 5 MG EC tablet Commonly known as:  DULCOLAX Take 1 tablet (5 mg total) by mouth daily as needed for moderate constipation.   buPROPion 300 MG 24 hr tablet Commonly known as:  WELLBUTRIN XL Take 300 mg by mouth daily.   calcium carbonate 600 MG Tabs tablet Commonly known as:  OS-CAL Take 600 mg by mouth daily.   clopidogrel 75 MG tablet Commonly known as:  PLAVIX Take 75 mg by mouth daily.   collagenase ointment Commonly known as:  SANTYL Apply topically daily.   COQ10 PO Take 1 capsule by mouth daily.   docusate sodium 100 MG capsule Commonly known as:  COLACE Take 100 mg by mouth daily.   Fish Oil 1000 MG Cpdr Take 1,200 mg by mouth daily.   GLUCOSAMINE-CHONDROITIN PO Take 1 tablet by mouth 2 (two) times daily.   insulin aspart protamine- aspart (70-30) 100 UNIT/ML injection Commonly known as:  NOVOLOG MIX 70/30 Inject 0.15 mLs (15 Units total) into the skin 2 (two) times daily with a meal. What changed:  how  much to take  when to take this   insulin lispro 100 UNIT/ML injection Commonly known as:  HUMALOG Inject into the skin once. Sliding scale 0-99=0 units 100-149=2 units 150-199=3 units 200-249=4 units 250-299=5 units 300-349=6 units 350-399= 7 units 400-449=8 units   losartan 50 MG tablet Commonly known as:  COZAAR Take 50 mg by mouth daily.   magnesium oxide 400 MG tablet Commonly known as:  MAG-OX Take 400 mg by mouth 2 (two) times daily.   meclizine 25 MG tablet Commonly known as:  ANTIVERT Take 1 tablet (25 mg total) by mouth 3 (three) times daily.   Melatonin 10 MG Tbdp Take 10 mg by mouth at bedtime.   naproxen sodium 220 MG tablet Commonly known as:  ANAPROX Take 220-440 mg by mouth 2 (two) times daily as needed (for pain).   niacin 500 MG tablet Take 500 mg by mouth daily.   OCUVITE PO Take 1 capsule by mouth daily.   polyethylene glycol packet Commonly known as:  MIRALAX / GLYCOLAX Take 17 g by mouth daily.   senna 8.6 MG Tabs tablet Commonly known as:  SENOKOT Take 1 tablet by mouth 2 (two) times daily.   SUPER B COMPLEX PO Take 1 tablet by mouth daily.   vitamin C 1000 MG tablet Take 1,000 mg by mouth daily.   Vitamin D3 1000 units Caps Take 1,000 Units by mouth daily.   vitamin E 400 UNIT capsule Take 400 Units by mouth daily.        DISCHARGE INSTRUCTIONS:   1. Neurology f/u in 2 weeks 2. PCP f/u in 1-2 weeks  DIET:   Cardiac diet  ACTIVITY:   Activity as tolerated  OXYGEN:   Home Oxygen: No.  Oxygen Delivery: room air  DISCHARGE LOCATION:   nursing home   If you experience worsening of your admission symptoms, develop shortness of breath, life threatening emergency, suicidal or homicidal thoughts you must seek medical attention immediately by calling 911 or calling your MD immediately  if symptoms less severe.  You Must read complete instructions/literature along with all the possible adverse reactions/side effects for all  the Medicines you take and that have been prescribed to you. Take any new Medicines after you have completely understood and accpet all the possible adverse reactions/side effects.   Please note  You were cared for by a hospitalist during your hospital stay. If you have any questions about your discharge medications or the care you received while you were in the hospital after you are discharged, you can call the unit and asked to speak with the hospitalist on call if the hospitalist that took care of you is not available. Once you are discharged, your primary care physician will handle any further medical issues. Please note that NO REFILLS for any discharge medications will be authorized once you are discharged, as it is imperative that you return to your primary care physician (or establish a relationship with a primary care physician if you do not have one) for your aftercare needs so that they can reassess your need for  medications and monitor your lab values.    On the day of Discharge:  VITAL SIGNS:   Blood pressure (!) 141/88, pulse 89, temperature 98.3 F (36.8 C), temperature source Oral, resp. rate 18, height 5\' 6"  (1.676 m), weight 114.9 kg (253 lb 4.8 oz), SpO2 92 %.  PHYSICAL EXAMINATION:   GENERAL:  70 y.o.-year-old Obese patient lying in the bed with no acute distress.  EYES: Pupils equal, round, reactive to light and accommodation. No scleral icterus. Extraocular muscles intact.  HEENT: Head atraumatic, normocephalic. Oropharynx and nasopharynx clear.  NECK:  Supple, no jugular venous distention. No thyroid enlargement, no tenderness.  LUNGS: Normal breath sounds bilaterally, no wheezing, rales,rhonchi or crepitation. No use of accessory muscles of respiration.  By basilar breath sounds CARDIOVASCULAR: S1, S2 normal. No murmurs, rubs, or gallops.  ABDOMEN: Soft, nontender, nondistended. Bowel sounds present. No organomegaly or mass.  EXTREMITIES: No pedal edema, cyanosis, or  clubbing. Wound on the left foot lateral side, s/p amputation of lateral 2 digits NEUROLOGIC: Cranial nerves II through XII are intact. Muscle strength 5/5 in all extremities except left upper extremity where the strength is 3/5. Decreased sensation in both feet due to neuropathy. No new changes in sensation.. Gait not checked.  PSYCHIATRIC: The patient is alert and oriented x 1-2  SKIN: No obvious rash, lesion, or ulcer.   DATA REVIEW:   CBC  Recent Labs Lab 02/03/16 0529  WBC 9.6  HGB 13.4  HCT 39.5  PLT 304    Chemistries   Recent Labs Lab 02/01/16 1406 02/03/16 0529  NA 137 138  K 4.6 4.2  CL 98* 98*  CO2 30 32  GLUCOSE 76 190*  BUN 24* 20  CREATININE 0.93 1.08*  CALCIUM 10.8* 9.7  AST 36  --   ALT 42  --   ALKPHOS 66  --   BILITOT 0.9  --      Microbiology Results  Results for orders placed or performed during the hospital encounter of 02/01/16  MRSA PCR Screening     Status: None   Collection Time: 02/01/16 10:35 PM  Result Value Ref Range Status   MRSA by PCR NEGATIVE NEGATIVE Final    Comment:        The GeneXpert MRSA Assay (FDA approved for NASAL specimens only), is one component of a comprehensive MRSA colonization surveillance program. It is not intended to diagnose MRSA infection nor to guide or monitor treatment for MRSA infections.     RADIOLOGY:  No results found.   Management plans discussed with the patient, family and they are in agreement.  CODE STATUS:     Code Status Orders        Start     Ordered   02/01/16 2118  Full code  Continuous     02/01/16 2117    Code Status History    Date Active Date Inactive Code Status Order ID Comments User Context   01/14/2016  2:01 PM 01/16/2016 10:22 PM Full Code [id number]  Katha HammingSnehalatha Konidena, MD ED   11/19/2014 10:40 PM 11/21/2014  8:55 PM Full Code [id number]  Oralia Manisavid Willis, MD Inpatient   05/22/2014  6:31 PM 05/25/2014  8:20 PM Full Code [id number]  Toni ArthursJohn Hewitt, MD Inpatient    05/21/2014 10:31 PM 05/22/2014  6:31 PM Full Code [id number]  Yevonne PaxSaadat A Khan, MD Inpatient      TOTAL TIME TAKING CARE OF THIS PATIENT: 39 minutes.    Enid BaasKALISETTI,Lakeidra Reliford M.D on 02/04/2016 at 1:05 PM  Between 7am to 6pm - Pager - [phone]  After 6pm go to [email or web address] - Social research officer, governmentpassword EPAS [facility]  [facility] [employer] [facility]  Office  [phone]  CC: Primary care physician; Elita Booneoberts, Caroline C, MD   Note: This dictation was prepared with Dragon dictation along with smaller phrase technology. Any transcriptional errors that result from this process are unintentional.

## 2016-02-04 NOTE — Plan of Care (Signed)
Problem: Education: Goal: Knowledge of [facility] Education information/materials will improve Outcome: Not Progressing Patient's cognitive function noted to be limited due to stroke. Writer constantly reminding patient to verbalize for any pain or discomforts.   Problem: Health Behavior/Discharge Planning: Goal: Ability to manage health-related needs will improve Outcome: Not Progressing Plans for patient to be discharged to a facility for a short term rehabilitation.   Problem: Pain Managment: Goal: General experience of comfort will improve Outcome: Progressing Maintains rest and comforts. No complaints made thus far.   Problem: Skin Integrity: Goal: Risk for impaired skin integrity will decrease Outcome: Not Progressing Chronic left foot osteomyelitis present. Wound care and dressing completed.   Problem: Activity: Goal: Risk for activity intolerance will decrease Outcome: Not Progressing Patient still unable to perform ADLs independently. Assistance provided.

## 2016-02-05 LAB — GLUCOSE, CAPILLARY: GLUCOSE-CAPILLARY: 239 mg/dL — AB (ref 65–99)

## 2016-02-05 NOTE — Care Management Important Message (Signed)
Important Message  Patient Details  Name: Molly [name] MRN: [id number] Date of Birth: 1945/09/06   Medicare Important Message Given:  Yes    Gwenette GreetBrenda S Raynah Gomes, RN 02/05/2016, 8:32 AM

## 2016-02-05 NOTE — Progress Notes (Signed)
Pt being discharged back to SNF today, [facility] called for [facility], husband at bedside and agrees with discharge, pt with no complaints, no distress or discomfort noted

## 2016-02-05 NOTE — Discharge Summary (Signed)
[facility] - [place] at [facility]   PATIENT NAME: Molly [name]    MR#:  [id number]  DATE OF BIRTH:  11-09-1945  DATE OF ADMISSION:  02/01/2016   ADMITTING PHYSICIAN: Auburn Bilberry, MD  DATE OF DISCHARGE: 02/05/16  PRIMARY CARE PHYSICIAN: Elita Boone, MD   ADMISSION DIAGNOSIS:   Altered mental status, unspecified altered mental status type [R41.82] Acute cerebrovascular accident (CVA) (HCC) [I63.9]  DISCHARGE DIAGNOSIS:   Active Problems:   CVS disease   Acute cerebrovascular accident (CVA) (HCC)   Altered mental status   SECONDARY DIAGNOSIS:   Past Medical History:  Diagnosis Date  . Anxiety   . COPD (chronic obstructive pulmonary disease) (HCC)   . Diabetes (HCC)   . Hemorrhoid   . HLD (hyperlipidemia)   . HTN (hypertension)   . 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  #1 acute CVA-MRI of the brain showing acute patchy ischemic infarcts involving left frontal lobe. Also chronic microvascular ischemic disease changes. -Appreciate neurology consult. Continue aspirin and Plavix. Started statin. -Holter monitor to look for any arrhythmias due to possibility of embolic stroke. Outpatient follow up -Continue physical therapy, occupational therapy. -Recent further stroke workup including Dopplers, echo done with no significant findings. No need to repeat stroke workup. - affected her memory, judgement and also has fluctuating mental status  #2 acute encephalopathy-likely secondary to stroke. No UTI noted. -Improving mental status and back to baseline -EEG With generalized slowing. No epileptic activity noted  #3 hypertension-on losartan  #4  diabetes mellitus- 70/30 insulin- dose has been adjusted and SSI as outpatient as well Discontinue metformin.  #5 GERD- protonix  Will be discharged back to rehab today. Received insurance authorization   DISCHARGE CONDITIONS:   Guarded  CONSULTS OBTAINED:   Treatment Team:  Thana Farr, MD Kym Groom, MD  DRUG ALLERGIES:   No Known Allergies DISCHARGE MEDICATIONS:     Medication List    STOP taking these medications   BILBERRY PO   doxycycline 100 MG tablet Commonly known as:  VIBRA-TABS   insulin aspart 100 UNIT/ML injection Commonly known as:  novoLOG   metFORMIN 500 MG tablet Commonly known as:  GLUCOPHAGE   Valerian 450 MG Caps     TAKE these medications   amitriptyline 50 MG tablet Commonly known as:  ELAVIL Take 50 mg by mouth daily.   aspirin EC 81 MG tablet Take 81 mg by mouth daily.   atorvastatin 40 MG tablet Commonly known as:  LIPITOR Take 1 tablet (40 mg total) by mouth daily at 6 PM.   bisacodyl 5 MG EC tablet Commonly known as:  DULCOLAX Take 1 tablet (5 mg total) by mouth daily as needed for moderate constipation.   buPROPion 300 MG 24 hr tablet Commonly known as:  WELLBUTRIN XL Take 300 mg by mouth daily.   calcium carbonate 600 MG Tabs tablet Commonly known as:  OS-CAL Take 600 mg by mouth daily.   clopidogrel 75 MG tablet Commonly known as:  PLAVIX Take 75 mg by mouth daily.   collagenase ointment Commonly known as:  SANTYL Apply topically daily.   COQ10 PO Take 1 capsule by mouth daily.   docusate sodium 100 MG capsule Commonly known as:  COLACE Take 100 mg by mouth daily.   Fish Oil 1000 MG Cpdr Take 1,200 mg by mouth daily.   GLUCOSAMINE-CHONDROITIN PO Take 1 tablet by mouth 2 (two) times daily.   insulin aspart protamine- aspart (70-30) 100 UNIT/ML injection Commonly known as:  NOVOLOG MIX 70/30 Inject 0.15 mLs (15 Units total) into the skin 2 (two) times daily with a meal. What changed:  how  much to take  when to take this   insulin lispro 100 UNIT/ML injection Commonly known as:  HUMALOG Inject into the skin once. Sliding scale 0-99=0 units 100-149=2 units 150-199=3 units 200-249=4 units 250-299=5 units 300-349=6 units 350-399= 7 units 400-449=8 units   losartan 50 MG tablet Commonly known as:  COZAAR Take 50 mg by mouth daily.   magnesium oxide 400 MG tablet Commonly known as:  MAG-OX Take 400 mg by mouth 2 (two) times daily.   meclizine 25 MG tablet Commonly known as:  ANTIVERT Take 1 tablet (25 mg total) by mouth 3 (three) times daily.   Melatonin 10 MG Tbdp Take 10 mg by mouth at bedtime.   naproxen sodium 220 MG tablet Commonly known as:  ANAPROX Take 220-440 mg by mouth 2 (two) times daily as needed (for pain).   niacin 500 MG tablet Take 500 mg by mouth daily.   OCUVITE PO Take 1 capsule by mouth daily.   polyethylene glycol packet Commonly known as:  MIRALAX / GLYCOLAX Take 17 g by mouth daily.   senna 8.6 MG Tabs tablet Commonly known as:  SENOKOT Take 1 tablet by mouth 2 (two) times daily.   SUPER B COMPLEX PO Take 1 tablet by mouth daily.   vitamin C 1000 MG tablet Take 1,000 mg by mouth daily.   Vitamin D3 1000 units Caps Take 1,000 Units by mouth daily.   vitamin E 400 UNIT capsule Take 400 Units by mouth daily.        DISCHARGE INSTRUCTIONS:   1. Neurology f/u in 2 weeks 2. PCP f/u in 1-2 weeks  DIET:   Cardiac diet  ACTIVITY:   Activity as tolerated  OXYGEN:   Home Oxygen: No.  Oxygen Delivery: room air  DISCHARGE LOCATION:   nursing home   If you experience worsening of your admission symptoms, develop shortness of breath, life threatening emergency, suicidal or homicidal thoughts you must seek medical attention immediately by calling 911 or calling your MD immediately  if symptoms less severe.  You Must read complete instructions/literature along with all the possible adverse reactions/side effects for all  the Medicines you take and that have been prescribed to you. Take any new Medicines after you have completely understood and accpet all the possible adverse reactions/side effects.   Please note  You were cared for by a hospitalist during your hospital stay. If you have any questions about your discharge medications or the care you received while you were in the hospital after you are discharged, you can call the unit and asked to speak with the hospitalist on call if the hospitalist that took care of you is not available. Once you are discharged, your primary care physician will handle any further medical issues. Please note that NO REFILLS for any discharge medications will be authorized once you are discharged, as it is imperative that you return to your primary care physician (or establish a relationship with a primary care physician if you do not have one) for your aftercare needs so that they can reassess your need for  medications and monitor your lab values.    On the day of Discharge:  VITAL SIGNS:   Blood pressure (!) 155/39, pulse 95, temperature 98.2 F (36.8 C), temperature source Oral, resp. rate (!) 24, height 5\' 6"  (1.676 m), weight 114.9 kg (253 lb 4.8 oz), SpO2 96 %.  PHYSICAL EXAMINATION:   GENERAL:  70 y.o.-year-old Obese patient lying in the bed with no acute distress.  EYES: Pupils equal, round, reactive to light and accommodation. No scleral icterus. Extraocular muscles intact.  HEENT: Head atraumatic, normocephalic. Oropharynx and nasopharynx clear.  NECK:  Supple, no jugular venous distention. No thyroid enlargement, no tenderness.  LUNGS: Normal breath sounds bilaterally, no wheezing, rales,rhonchi or crepitation. No use of accessory muscles of respiration.  By basilar breath sounds CARDIOVASCULAR: S1, S2 normal. No murmurs, rubs, or gallops.  ABDOMEN: Soft, nontender, nondistended. Bowel sounds present. No organomegaly or mass.  EXTREMITIES: No pedal edema, cyanosis,  or clubbing. Wound on the left foot lateral side, s/p amputation of lateral 2 digits NEUROLOGIC: Cranial nerves II through XII are intact. Muscle strength 5/5 in all extremities except left upper extremity where the strength is 3/5. Decreased sensation in both feet due to neuropathy. No new changes in sensation.. Gait not checked.  PSYCHIATRIC: The patient is alert and oriented x 1-2  SKIN: No obvious rash, lesion, or ulcer.   DATA REVIEW:   CBC  Recent Labs Lab 02/03/16 0529  WBC 9.6  HGB 13.4  HCT 39.5  PLT 304    Chemistries   Recent Labs Lab 02/01/16 1406 02/03/16 0529  NA 137 138  K 4.6 4.2  CL 98* 98*  CO2 30 32  GLUCOSE 76 190*  BUN 24* 20  CREATININE 0.93 1.08*  CALCIUM 10.8* 9.7  AST 36  --   ALT 42  --   ALKPHOS 66  --   BILITOT 0.9  --      Microbiology Results  Results for orders placed or performed during the hospital encounter of 02/01/16  MRSA PCR Screening     Status: None   Collection Time: 02/01/16 10:35 PM  Result Value Ref Range Status   MRSA by PCR NEGATIVE NEGATIVE Final    Comment:        The GeneXpert MRSA Assay (FDA approved for NASAL specimens only), is one component of a comprehensive MRSA colonization surveillance program. It is not intended to diagnose MRSA infection nor to guide or monitor treatment for MRSA infections.     RADIOLOGY:  No results found.   Management plans discussed with the patient, family and they are in agreement.  CODE STATUS:     Code Status Orders        Start     Ordered   02/01/16 2118  Full code  Continuous     02/01/16 2117    Code Status History    Date Active Date Inactive Code Status Order ID Comments User Context   01/14/2016  2:01 PM 01/16/2016 10:22 PM Full Code [id number]  Katha HammingSnehalatha Konidena, MD ED   11/19/2014 10:40 PM 11/21/2014  8:55 PM Full Code [id number]  Oralia Manisavid Willis, MD Inpatient   05/22/2014  6:31 PM 05/25/2014  8:20 PM Full Code [id number]  Toni ArthursJohn Hewitt, MD Inpatient    05/21/2014 10:31 PM 05/22/2014  6:31 PM Full Code [id number]  Yevonne PaxSaadat A Khan, MD Inpatient      TOTAL TIME TAKING CARE OF THIS PATIENT: 39 minutes.    Enid BaasKALISETTI,Yahaira Bruski M.D on 02/05/2016 at 9:49 AM  Between 7am to 6pm - Pager - [phone]  After 6pm go to [email or web address] - Social research officer, governmentpassword [email or web address]  [facility] [employer] [facility]  Office  [phone]  CC: Primary care physician; Elita Booneoberts, Caroline C, MD   Note: This dictation was prepared with Dragon dictation along with smaller phrase technology. Any transcriptional errors that result from this process are unintentional.

## 2016-02-05 NOTE — Progress Notes (Addendum)
LCSW made contact with facility and authorization has been obtained as of 5:49pm. Patient is cleared for discharge and able to return to SNF. [facility]. RN to call report.  Packet completed and LCSW has completed discharge paperwork and sent to facility.   No other needs at this time.  Patient will transport by EMS. Family is in room and agreement to plan to return today.  All questions answered and updated that authorization has been obtained.   Deretha EmoryHannah Elia Nunley LCSW, MSW Clinical Social Work: Optician, dispensingystem [employer] Coverage for :  [phone]

## 2016-02-05 NOTE — Progress Notes (Signed)
Insurance authorization received. No changes overnight. Patient is stable to return to rehabilitation today

## 2016-02-06 ENCOUNTER — Inpatient Hospital Stay
Admission: EM | Admit: 2016-02-06 | Discharge: 2016-02-11 | DRG: 071 | Disposition: A | Discharge status: Skilled Nursing Facility | Payer: Medicare Other | Attending: Internal Medicine | Admitting: Internal Medicine

## 2016-02-06 ENCOUNTER — Emergency Department: Payer: Medicare Other

## 2016-02-06 ENCOUNTER — Inpatient Hospital Stay: Payer: Medicare Other

## 2016-02-06 DIAGNOSIS — M6281 Muscle weakness (generalized): Secondary | ICD-10-CM

## 2016-02-06 DIAGNOSIS — I10 Essential (primary) hypertension: Secondary | ICD-10-CM | POA: Diagnosis not present

## 2016-02-06 DIAGNOSIS — Z823 Family history of stroke: Secondary | ICD-10-CM

## 2016-02-06 DIAGNOSIS — Z6841 Body Mass Index (BMI) 40.0 and over, adult: Secondary | ICD-10-CM

## 2016-02-06 DIAGNOSIS — I4510 Unspecified right bundle-branch block: Secondary | ICD-10-CM | POA: Diagnosis present

## 2016-02-06 DIAGNOSIS — J449 Chronic obstructive pulmonary disease, unspecified: Secondary | ICD-10-CM | POA: Diagnosis not present

## 2016-02-06 DIAGNOSIS — R4701 Aphasia: Secondary | ICD-10-CM | POA: Diagnosis not present

## 2016-02-06 DIAGNOSIS — Z8673 Personal history of transient ischemic attack (TIA), and cerebral infarction without residual deficits: Secondary | ICD-10-CM | POA: Diagnosis not present

## 2016-02-06 DIAGNOSIS — Z7902 Long term (current) use of antithrombotics/antiplatelets: Secondary | ICD-10-CM

## 2016-02-06 DIAGNOSIS — Z87891 Personal history of nicotine dependence: Secondary | ICD-10-CM | POA: Diagnosis not present

## 2016-02-06 DIAGNOSIS — Z79899 Other long term (current) drug therapy: Secondary | ICD-10-CM

## 2016-02-06 DIAGNOSIS — E669 Obesity, unspecified: Secondary | ICD-10-CM | POA: Diagnosis not present

## 2016-02-06 DIAGNOSIS — Z7982 Long term (current) use of aspirin: Secondary | ICD-10-CM

## 2016-02-06 DIAGNOSIS — I69334 Monoplegia of upper limb following cerebral infarction affecting left non-dominant side: Secondary | ICD-10-CM

## 2016-02-06 DIAGNOSIS — Z89422 Acquired absence of other left toe(s): Secondary | ICD-10-CM | POA: Diagnosis not present

## 2016-02-06 DIAGNOSIS — G9340 Encephalopathy, unspecified: Principal | ICD-10-CM | POA: Diagnosis present

## 2016-02-06 DIAGNOSIS — R410 Disorientation, unspecified: Secondary | ICD-10-CM | POA: Diagnosis present

## 2016-02-06 DIAGNOSIS — R4182 Altered mental status, unspecified: Secondary | ICD-10-CM

## 2016-02-06 DIAGNOSIS — Z9849 Cataract extraction status, unspecified eye: Secondary | ICD-10-CM

## 2016-02-06 DIAGNOSIS — Z794 Long term (current) use of insulin: Secondary | ICD-10-CM | POA: Diagnosis not present

## 2016-02-06 DIAGNOSIS — E119 Type 2 diabetes mellitus without complications: Secondary | ICD-10-CM | POA: Diagnosis present

## 2016-02-06 LAB — URINALYSIS COMPLETE WITH MICROSCOPIC (ARMC ONLY)
BACTERIA UA: NONE SEEN
Bilirubin Urine: NEGATIVE
Glucose, UA: >500 mg/dL — AB
HGB URINE DIPSTICK: NEGATIVE
KETONES UR: NEGATIVE mg/dL
LEUKOCYTES UA: NEGATIVE
NITRITE: NEGATIVE
PROTEIN: 100 mg/dL — AB
SPECIFIC GRAVITY, URINE: 1.021 (ref 1.005–1.030)
pH: 6 (ref 5.0–8.0)

## 2016-02-06 LAB — URINE DRUG SCREEN, QUALITATIVE (ARMC ONLY)
AMPHETAMINES, UR SCREEN: NOT DETECTED
BENZODIAZEPINE, UR SCRN: NOT DETECTED
Barbiturates, Ur Screen: NOT DETECTED
CANNABINOID 50 NG, UR ~~LOC~~: NOT DETECTED
Cocaine Metabolite,Ur ~~LOC~~: NOT DETECTED
MDMA (Ecstasy)Ur Screen: NOT DETECTED
Methadone Scn, Ur: NOT DETECTED
OPIATE, UR SCREEN: NOT DETECTED
PHENCYCLIDINE (PCP) UR S: NOT DETECTED
Tricyclic, Ur Screen: POSITIVE — AB

## 2016-02-06 LAB — CBC WITH DIFFERENTIAL/PLATELET
BASOS ABS: 0 10*3/uL (ref 0–0.1)
BASOS PCT: 0 %
Eosinophils Absolute: 0.1 10*3/uL (ref 0–0.7)
Eosinophils Relative: 1 %
HEMATOCRIT: 42.3 % (ref 35.0–47.0)
HEMOGLOBIN: 14.1 g/dL (ref 12.0–16.0)
LYMPHS PCT: 20 %
Lymphs Abs: 2.5 10*3/uL (ref 1.0–3.6)
MCH: 31.2 pg (ref 26.0–34.0)
MCHC: 33.3 g/dL (ref 32.0–36.0)
MCV: 93.7 fL (ref 80.0–100.0)
Monocytes Absolute: 1.1 10*3/uL — ABNORMAL HIGH (ref 0.2–0.9)
Monocytes Relative: 9 %
NEUTROS ABS: 8.5 10*3/uL — AB (ref 1.4–6.5)
NEUTROS PCT: 70 %
Platelets: 315 10*3/uL (ref 150–440)
RBC: 4.51 MIL/uL (ref 3.80–5.20)
RDW: 14.5 % (ref 11.5–14.5)
WBC: 12.3 10*3/uL — AB (ref 3.6–11.0)

## 2016-02-06 LAB — COMPREHENSIVE METABOLIC PANEL
ALT: 44 U/L (ref 14–54)
ANION GAP: 7 (ref 5–15)
AST: 31 U/L (ref 15–41)
Albumin: 3.8 g/dL (ref 3.5–5.0)
Alkaline Phosphatase: 60 U/L (ref 38–126)
BILIRUBIN TOTAL: 0.8 mg/dL (ref 0.3–1.2)
BUN: 26 mg/dL — AB (ref 6–20)
CHLORIDE: 93 mmol/L — AB (ref 101–111)
CO2: 33 mmol/L — ABNORMAL HIGH (ref 22–32)
Calcium: 10.7 mg/dL — ABNORMAL HIGH (ref 8.9–10.3)
Creatinine, Ser: 1.24 mg/dL — ABNORMAL HIGH (ref 0.44–1.00)
GFR calc Af Amer: 50 mL/min — ABNORMAL LOW (ref >60)
GFR, EST NON AFRICAN AMERICAN: 43 mL/min — AB (ref >60)
Glucose, Bld: 339 mg/dL — ABNORMAL HIGH (ref 65–99)
POTASSIUM: 5.1 mmol/L (ref 3.5–5.1)
Sodium: 133 mmol/L — ABNORMAL LOW (ref 135–145)
TOTAL PROTEIN: 7.7 g/dL (ref 6.5–8.1)

## 2016-02-06 LAB — GLUCOSE, CAPILLARY
GLUCOSE-CAPILLARY: 315 mg/dL — AB (ref 65–99)
Glucose-Capillary: 260 mg/dL — ABNORMAL HIGH (ref 65–99)

## 2016-02-06 LAB — AMMONIA: AMMONIA: 14 umol/L (ref 9–35)

## 2016-02-06 LAB — PROTIME-INR
INR: 1.04
Prothrombin Time: 13.6 seconds (ref 11.4–15.2)

## 2016-02-06 LAB — TROPONIN I

## 2016-02-06 MED ORDER — AMITRIPTYLINE HCL 25 MG PO TABS
50.0000 mg | ORAL_TABLET | Freq: Every day | ORAL | Status: DC
  Administered 2016-02-07 – 2016-02-11 (×5): 50 mg via ORAL
  Filled 2016-02-06 (×5): qty 2

## 2016-02-06 MED ORDER — ONDANSETRON HCL 4 MG PO TABS: 4.0000 mg | ORAL_TABLET | Freq: Four times a day (QID) | ORAL | Status: DC | PRN

## 2016-02-06 MED ORDER — ATORVASTATIN CALCIUM 20 MG PO TABS
40.0000 mg | ORAL_TABLET | Freq: Every day | ORAL | Status: DC
  Administered 2016-02-07 – 2016-02-10 (×4): 40 mg via ORAL
  Filled 2016-02-06 (×4): qty 2

## 2016-02-06 MED ORDER — INSULIN ASPART 100 UNIT/ML ~~LOC~~ SOLN
0.0000 [IU] | Freq: Three times a day (TID) | SUBCUTANEOUS | Status: DC
  Administered 2016-02-07 (×2): 5 [IU] via SUBCUTANEOUS
  Administered 2016-02-07 – 2016-02-08 (×3): 7 [IU] via SUBCUTANEOUS
  Administered 2016-02-08 – 2016-02-09 (×2): 5 [IU] via SUBCUTANEOUS
  Administered 2016-02-09: 12:00:00 3 [IU] via SUBCUTANEOUS
  Administered 2016-02-09: 08:00:00 5 [IU] via SUBCUTANEOUS
  Administered 2016-02-10: 9 [IU] via SUBCUTANEOUS
  Administered 2016-02-10: 17:00:00 3 [IU] via SUBCUTANEOUS
  Administered 2016-02-10 – 2016-02-11 (×3): 5 [IU] via SUBCUTANEOUS
  Filled 2016-02-06: qty 7
  Filled 2016-02-06 (×2): qty 5
  Filled 2016-02-06: qty 7
  Filled 2016-02-06: qty 3
  Filled 2016-02-06: qty 9
  Filled 2016-02-06 (×2): qty 5
  Filled 2016-02-06: qty 7
  Filled 2016-02-06 (×5): qty 5

## 2016-02-06 MED ORDER — OXYCODONE HCL 5 MG PO TABS: 5.0000 mg | ORAL_TABLET | ORAL | Status: DC | PRN

## 2016-02-06 MED ORDER — DOCUSATE SODIUM 100 MG PO CAPS
100.0000 mg | ORAL_CAPSULE | Freq: Every day | ORAL | Status: DC
  Administered 2016-02-07 – 2016-02-11 (×5): 100 mg via ORAL
  Filled 2016-02-06 (×5): qty 1

## 2016-02-06 MED ORDER — ONDANSETRON HCL 4 MG/2ML IJ SOLN: 4.0000 mg | Freq: Four times a day (QID) | INTRAMUSCULAR | Status: DC | PRN

## 2016-02-06 MED ORDER — LORAZEPAM 2 MG/ML IJ SOLN
INTRAMUSCULAR | Status: AC
  Filled 2016-02-06: qty 1

## 2016-02-06 MED ORDER — SODIUM CHLORIDE 0.9% FLUSH
3.0000 mL | Freq: Two times a day (BID) | INTRAVENOUS | Status: DC
  Administered 2016-02-06 (×2): 3 mL via INTRAVENOUS
  Administered 2016-02-07: 10:00:00 via INTRAVENOUS
  Administered 2016-02-07 – 2016-02-11 (×8): 3 mL via INTRAVENOUS

## 2016-02-06 MED ORDER — ACETAMINOPHEN 325 MG PO TABS: 650.0000 mg | ORAL_TABLET | Freq: Four times a day (QID) | ORAL | Status: DC | PRN

## 2016-02-06 MED ORDER — CLOPIDOGREL BISULFATE 75 MG PO TABS
75.0000 mg | ORAL_TABLET | Freq: Every day | ORAL | Status: DC
  Administered 2016-02-07 – 2016-02-11 (×5): 75 mg via ORAL
  Filled 2016-02-06 (×5): qty 1

## 2016-02-06 MED ORDER — BUPROPION HCL ER (XL) 300 MG PO TB24
300.0000 mg | ORAL_TABLET | Freq: Every day | ORAL | Status: DC
  Administered 2016-02-07 – 2016-02-11 (×5): 300 mg via ORAL
  Filled 2016-02-06 (×6): qty 1

## 2016-02-06 MED ORDER — MECLIZINE HCL 12.5 MG PO TABS
25.0000 mg | ORAL_TABLET | Freq: Three times a day (TID) | ORAL | Status: DC
  Administered 2016-02-07 – 2016-02-11 (×14): 25 mg via ORAL
  Filled 2016-02-06 (×17): qty 2

## 2016-02-06 MED ORDER — POLYETHYLENE GLYCOL 3350 17 G PO PACK
17.0000 g | PACK | Freq: Every day | ORAL | Status: DC
  Administered 2016-02-07 – 2016-02-11 (×5): 17 g via ORAL
  Filled 2016-02-06 (×5): qty 1

## 2016-02-06 MED ORDER — LOSARTAN POTASSIUM 50 MG PO TABS: 50.0000 mg | ORAL_TABLET | Freq: Every day | ORAL | Status: DC

## 2016-02-06 MED ORDER — NIACIN 500 MG PO TABS
500.0000 mg | ORAL_TABLET | Freq: Every day | ORAL | Status: DC
  Administered 2016-02-07 – 2016-02-11 (×5): 500 mg via ORAL
  Filled 2016-02-06 (×6): qty 1

## 2016-02-06 MED ORDER — INSULIN ASPART 100 UNIT/ML ~~LOC~~ SOLN
0.0000 [IU] | Freq: Every day | SUBCUTANEOUS | Status: DC
  Administered 2016-02-06 – 2016-02-10 (×5): 3 [IU] via SUBCUTANEOUS
  Filled 2016-02-06 (×5): qty 3

## 2016-02-06 MED ORDER — CALCIUM CARBONATE ANTACID 500 MG PO CHEW
500.0000 mg | CHEWABLE_TABLET | Freq: Every day | ORAL | Status: DC
  Administered 2016-02-07 – 2016-02-11 (×5): 500 mg via ORAL
  Filled 2016-02-06 (×5): qty 1

## 2016-02-06 MED ORDER — ENOXAPARIN SODIUM 40 MG/0.4ML ~~LOC~~ SOLN
40.0000 mg | SUBCUTANEOUS | Status: DC
  Administered 2016-02-06 – 2016-02-07 (×2): 40 mg via SUBCUTANEOUS
  Filled 2016-02-06 (×2): qty 0.4

## 2016-02-06 MED ORDER — ACETAMINOPHEN 650 MG RE SUPP: 650.0000 mg | Freq: Four times a day (QID) | RECTAL | Status: DC | PRN

## 2016-02-06 MED ORDER — STROKE: EARLY STAGES OF RECOVERY BOOK
Freq: Once | Status: AC
  Administered 2016-02-06: 19:00:00

## 2016-02-06 MED ORDER — ASPIRIN EC 81 MG PO TBEC
81.0000 mg | DELAYED_RELEASE_TABLET | Freq: Every day | ORAL | Status: DC
  Administered 2016-02-07 – 2016-02-11 (×5): 81 mg via ORAL
  Filled 2016-02-06 (×5): qty 1

## 2016-02-06 NOTE — ED Notes (Signed)
Patient transported to MRI 

## 2016-02-06 NOTE — ED Notes (Signed)
Tech called and reports pt needs ativan to perform MRI. RN paged Dr. Clint GuyHower to request medication.

## 2016-02-06 NOTE — H&P (Signed)
[facility] - [place] at [facility]   PATIENT NAME: Molly DustmanBrenda Dovel    MR#:  [id number]  DATE OF BIRTH:  1946/02/10   DATE OF ADMISSION:  02/06/2016  PRIMARY CARE PHYSICIAN: Elita Booneoberts, Caroline C, MD   REQUESTING/REFERRING PHYSICIAN: [email or web address]  CHIEF COMPLAINT:   Chief Complaint  Patient presents with  . Aphasia    HISTORY OF PRESENT ILLNESS:  Molly DustmanBrenda Meigs  is a 70 y.o. female with a known history of Recent left frontal stroke discharged yesterday from the hospital who is presenting with slurred speech. [facility] noted to have slurred speech around 10 AM which is new according to family members. Patient is unable to provide meaningful information given current mental status   PAST MEDICAL HISTORY:   Past Medical History:  Diagnosis Date  . Anxiety   . COPD (chronic obstructive pulmonary disease) (HCC)   . Diabetes (HCC)   . Hemorrhoid   . HLD (hyperlipidemia)   . HTN (hypertension)   . Stroke [facility](HCC) 2008   left weakness    PAST SURGICAL HISTORY:   Past Surgical History:  Procedure Laterality Date  . AMPUTATION Left 05/22/2014   Procedure: AMPUTATION RAY LEFT FOURTH AND FIFTH ;  Surgeon: Toni ArthursJohn Hewitt, MD;  [facility];  Service: Orthopedics;  Laterality: Left;  . CATARACT EXTRACTION    . COLONOSCOPY  2004    SOCIAL HISTORY:   Social History  Substance Use Topics  . Smoking status: Former Smoker    Packs/day: 0.50    Years: 2.00    Types: Cigarettes    Quit date: 11/18/1968  . Smokeless tobacco: Never Used  . Alcohol use No     Comment: seldom    FAMILY HISTORY:   Family History  Problem Relation Age of Onset  . Hyperlipidemia Mother   . Varicose Veins Mother   . Deep vein thrombosis Father   . Stroke Father   . Breast cancer Paternal Aunt   . Stroke Maternal Grandmother   . Stroke Paternal Grandmother     DRUG ALLERGIES:  No Known Allergies  REVIEW OF SYSTEMS:  Unable to obtain given  mental status   MEDICATIONS AT HOME:   Prior to Admission medications   Medication Sig Start Date End Date Taking? Authorizing Provider  amitriptyline (ELAVIL) 50 MG tablet Take 50 mg by mouth daily.   Yes Historical Provider, MD  Ascorbic Acid (VITAMIN C) 1000 MG tablet Take 1,000 mg by mouth daily.   Yes Historical Provider, MD  aspirin EC 81 MG tablet Take 81 mg by mouth daily.   Yes Historical Provider, MD  atorvastatin (LIPITOR) 40 MG tablet Take 1 tablet (40 mg total) by mouth daily at 6 PM. 02/03/16  Yes Enid Baasadhika Kalisetti, MD  B Complex-C (SUPER B COMPLEX PO) Take 1 tablet by mouth daily.    Yes Historical Provider, MD  bisacodyl (DULCOLAX) 5 MG EC tablet Take 1 tablet (5 mg total) by mouth daily as needed for moderate constipation. 01/16/16  Yes Ramonita LabAruna Gouru, MD  buPROPion (WELLBUTRIN XL) 300 MG 24 hr tablet Take 300 mg by mouth daily.   Yes Historical Provider, MD  calcium carbonate (OS-CAL) 600 MG TABS tablet Take 600 mg by mouth daily.   Yes Historical Provider, MD  Cholecalciferol (VITAMIN D3) 1000 UNITS CAPS Take 1,000 Units by mouth daily.   Yes Historical Provider, MD  clopidogrel (PLAVIX) 75 MG tablet Take 75 mg by mouth daily.   Yes  Historical Provider, MD  Coenzyme Q10 (COQ10 PO) Take 1 capsule by mouth daily.    Yes Historical Provider, MD  docusate sodium (COLACE) 100 MG capsule Take 100 mg by mouth daily.   Yes Historical Provider, MD  GLUCOSAMINE-CHONDROITIN PO Take 1 tablet by mouth 2 (two) times daily.    Yes Historical Provider, MD  insulin lispro (HUMALOG) 100 UNIT/ML injection Inject into the skin once. Sliding scale 0-99=0 units 100-149=2 units 150-199=3 units 200-249=4 units 250-299=5 units 300-349=6 units 350-399= 7 units 400-449=8 units   Yes Historical Provider, MD  magnesium oxide (MAG-OX) 400 MG tablet Take 400 mg by mouth 2 (two) times daily.   Yes Historical Provider, MD  meclizine (ANTIVERT) 25 MG tablet Take 1 tablet (25 mg total) by mouth 3 (three)  times daily. 01/16/16  Yes Ramonita Lab, MD  Melatonin 10 MG TBDP Take 10 mg by mouth at bedtime.   Yes Historical Provider, MD  Multiple Vitamins-Minerals (OCUVITE PO) Take 1 capsule by mouth daily.    Yes Historical Provider, MD  naproxen sodium (ANAPROX) 220 MG tablet Take 220-440 mg by mouth 2 (two) times daily as needed (for pain).    Yes Historical Provider, MD  niacin 500 MG tablet Take 500 mg by mouth daily.    Yes Historical Provider, MD  Omega-3 Fatty Acids (FISH OIL) 1000 MG CPDR Take 1,200 mg by mouth daily.   Yes Historical Provider, MD  polyethylene glycol (MIRALAX / GLYCOLAX) packet Take 17 g by mouth daily.   Yes Historical Provider, MD  senna (SENOKOT) 8.6 MG TABS tablet Take 1 tablet by mouth 2 (two) times daily.   Yes Historical Provider, MD  triamcinolone cream (KENALOG) 0.1 % Apply 1 application topically 2 (two) times daily.   Yes Historical Provider, MD  vitamin E 400 UNIT capsule Take 400 Units by mouth daily.   Yes Historical Provider, MD  collagenase (SANTYL) ointment Apply topically daily. Patient not taking: Reported on 02/06/2016 01/17/16   Ramonita Lab, MD  insulin aspart protamine- aspart (NOVOLOG MIX 70/30) (70-30) 100 UNIT/ML injection Inject 0.15 mLs (15 Units total) into the skin 2 (two) times daily with a meal. Patient not taking: Reported on 02/06/2016 02/04/16   Enid Baas, MD  losartan (COZAAR) 50 MG tablet Take 50 mg by mouth daily.    Historical Provider, MD      VITAL SIGNS:  Blood pressure 103/89, pulse 96, temperature 98 F (36.7 C), temperature source Oral, resp. rate 17, height 5\' 6"  (1.676 m), weight 114.8 kg (253 lb), SpO2 94 %.  PHYSICAL EXAMINATION:   VITAL SIGNS: Vitals:   02/06/16 1330 02/06/16 1509  BP: 128/74 103/89  Pulse: 95 96  Resp: 19 17  Temp:     GENERAL:70 y.o.female moderate distress given mental status.  HEAD: Normocephalic, atraumatic.  EYES: Pupils equal, round, reactive to light. Unable to assess extraocular  muscles given mental status/medical condition. No scleral icterus.  MOUTH: Moist mucosal membrane. Dentition intact. No abscess noted.  EAR, NOSE, THROAT: Clear without exudates. No external lesions.  NECK: Supple. No thyromegaly. No nodules. No JVD.  PULMONARY: Clear to ascultation, without wheeze rails or rhonci. No use of accessory muscles, Good respiratory effort. good air entry bilaterally CHEST: Nontender to palpation.  CARDIOVASCULAR: S1 and S2. Regular rate and rhythm. No murmurs, rubs, or gallops. No edema. Pedal pulses 2+ bilaterally.  GASTROINTESTINAL: Soft, nontender, nondistended. No masses. Positive bowel sounds. No hepatosplenomegaly.  MUSCULOSKELETAL: No swelling, clubbing, or edema. Range of motion full  in all extremities.  NEUROLOGIC: Unable to assess given mental status/medical condition SKIN: Left foot dressing clean dry and intact otherwise No ulceration, lesions, rashes, or cyanosis. Skin warm and dry. Turgor intact.  PSYCHIATRIC: Unable to assess given mental status/medical condition     LABORATORY PANEL:   CBC  Recent Labs Lab 02/06/16 1336  WBC 12.3*  HGB 14.1  HCT 42.3  PLT 315   ------------------------------------------------------------------------------------------------------------------  Chemistries   Recent Labs Lab 02/06/16 1336  NA 133*  K 5.1  CL 93*  CO2 33*  GLUCOSE 339*  BUN 26*  CREATININE 1.24*  CALCIUM 10.7*  AST 31  ALT 44  ALKPHOS 60  BILITOT 0.8   ------------------------------------------------------------------------------------------------------------------  Cardiac Enzymes  Recent Labs Lab 02/06/16 1336  TROPONINI <0.03   ------------------------------------------------------------------------------------------------------------------  RADIOLOGY:  Ct Head Wo Contrast  Result Date: 02/06/2016 CLINICAL DATA:  Weakness EXAM: CT HEAD WITHOUT CONTRAST TECHNIQUE: Contiguous axial images were obtained from the  base of the skull through the vertex without intravenous contrast. COMPARISON:  02/01/2016 FINDINGS: Brain: Moderate atrophy. Extensive chronic microvascular ischemic changes. Chronic infarct posterior medial temporal lobe on the right extending into the posterior limb internal capsule. This is unchanged. Negative for acute infarct, hemorrhage, or mass lesion. No shift of the midline structures. Vascular: No hyperdense vessel or unexpected calcification. Skull: Negative Sinuses/Orbits: Negative Other: None IMPRESSION: Atrophy and chronic ischemic changes stable.  No acute abnormality. Electronically Signed   By: Marlan Palauharles  Clark M.D.   On: 02/06/2016 14:08   Dg Chest Port 1 View  Result Date: 02/06/2016 CLINICAL DATA:  Slurred speech. EXAM: PORTABLE CHEST 1 VIEW COMPARISON:  11/19/2014. FINDINGS: Mild fullness of the superior mediastinum most likely from portable technique. Cardiomegaly with normal pulmonary vascularity. No focal infiltrate. No pleural effusion or pneumothorax. No acute bony abnormality. IMPRESSION: Mild cardiomegaly.  No CHF.  No acute pulmonary disease. Electronically Signed   By: Maisie Fushomas  Register   On: 02/06/2016 14:36    EKG:   Orders placed or performed during the hospital encounter of 02/06/16  . ED EKG  . ED EKG  . EKG 12-Lead  . EKG 12-Lead    IMPRESSION AND PLAN:   70 year old Caucasian female discharged [facility] yesterday diagnosis of acute left frontal stroke who is presenting with slurred speech and altered mental status  1. Acute encephalopathy: Given setting of recent stroke possible this could be seizure-like activity neurology consult, EEG, recent stroke workup already performed LDL cholesterol of 81, hemoglobin A1c 6.7,   2. Essential hypertension: Continue losartan 3. Type 2 diabetes insulin requiring: Sliding scale coverage 4. Recent stroke continue aspirin and Plavix statin therapy  All the records are reviewed and case discussed with ED  provider. Management plans discussed with the patient, family and they are in agreement.  CODE STATUS: Full  TOTAL TIME TAKING CARE OF THIS PATIENT: 33 minutes.    Jahnia Hewes,  Mardi MainlandDavid K M.D on 02/06/2016 at 3:44 PM  Between 7am to 6pm - Pager - [phone]  After 6pm: House Pager: [phone]  [employer]  Hospitalists  Office  [phone]  CC: Primary care physician; Elita Booneoberts, Caroline C, MD

## 2016-02-06 NOTE — ED Provider Notes (Signed)
[facility] Emergency Department Provider Note  ____________________________________________   I have reviewed the triage vital signs and the nursing notes.   HISTORY  Chief Complaint Confusion   HPI Ardis HughsBrenda P Boomer is a 70 y.o. female who presents today accompanied by her husband. Patient was recently seen and discharged with a CVA. She has a baseline left upper extremity weakness even prior to that it appears. Today, around perhaps 10:00 this morning or may be earlier, the exact time of onset is not clear, she began to act confused. She seemed to have some difficulty making words area this is very similar to her prior presentation. The patient denies any change. She is somewhat confused. His heart is no how different this is from her baseline. Her husband however did arrive at the bedside and stated that around perhaps 10:00 maybe a little earlier she began to have increased confusion and difficulty speaking. Is not clear if the difficulty speaking was because of the confusion. The patient herself has no complaints.Level 5 chart caveat; no further history available due to patient status.     Past Medical History:  Diagnosis Date  . Anxiety   . COPD (chronic obstructive pulmonary disease) (HCC)   . Diabetes (HCC)   . Hemorrhoid   . HLD (hyperlipidemia)   . HTN (hypertension)   . Stroke [facility](HCC) 2008   left weakness    Patient Active Problem List   Diagnosis Date Noted  . Acute cerebrovascular accident (CVA) (HCC)   . Altered mental status   . 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 Pressure injury of skin 01/15/2016  . Dizziness 01/14/2016  . Cellulitis of left lower extremity 11/19/2014  . COPD (chronic obstructive pulmonary disease) (HCC) 11/19/2014  . HTN (hypertension) 11/19/2014  . HLD (hyperlipidemia) 11/19/2014  . Sepsis (HCC) 11/19/2014  . Cellulitis of left foot 05/21/2014  . Diabetes mellitus (HCC) 05/21/2014  . Chronic anemia 05/21/2014  . Osteomyelitis  of left foot (HCC) 05/21/2014  . Foot ulcer (HCC) 02/04/2014    Past Surgical History:  Procedure Laterality Date  . AMPUTATION Left 05/22/2014   Procedure: AMPUTATION RAY LEFT FOURTH AND FIFTH ;  Surgeon: Toni ArthursJohn Hewitt, MD;  [facility];  Service: Orthopedics;  Laterality: Left;  . CATARACT EXTRACTION    . COLONOSCOPY  2004    Prior to Admission medications   Medication Sig Start Date End Date Taking? Authorizing Provider  amitriptyline (ELAVIL) 50 MG tablet Take 50 mg by mouth daily.    Historical Provider, MD  Ascorbic Acid (VITAMIN C) 1000 MG tablet Take 1,000 mg by mouth daily.    Historical Provider, MD  aspirin EC 81 MG tablet Take 81 mg by mouth daily.    Historical Provider, MD  atorvastatin (LIPITOR) 40 MG tablet Take 1 tablet (40 mg total) by mouth daily at 6 PM. 02/03/16   Enid Baasadhika Kalisetti, MD  B Complex-C (SUPER B COMPLEX PO) Take 1 tablet by mouth daily.     Historical Provider, MD  bisacodyl (DULCOLAX) 5 MG EC tablet Take 1 tablet (5 mg total) by mouth daily as needed for moderate constipation. Patient not taking: Reported on 02/01/2016 01/16/16   Ramonita LabAruna Gouru, MD  buPROPion (WELLBUTRIN XL) 300 MG 24 hr tablet Take 300 mg by mouth daily.    Historical Provider, MD  calcium carbonate (OS-CAL) 600 MG TABS tablet Take 600 mg by mouth daily.    Historical Provider, MD  Cholecalciferol (VITAMIN D3) 1000 UNITS CAPS Take 1,000 Units by mouth  daily.    Historical Provider, MD  clopidogrel (PLAVIX) 75 MG tablet Take 75 mg by mouth daily.    Historical Provider, MD  Coenzyme Q10 (COQ10 PO) Take 1 capsule by mouth daily.     Historical Provider, MD  collagenase (SANTYL) ointment Apply topically daily. Patient not taking: Reported on 02/01/2016 01/17/16   Ramonita Lab, MD  docusate sodium (COLACE) 100 MG capsule Take 100 mg by mouth daily.    Historical Provider, MD  GLUCOSAMINE-CHONDROITIN PO Take 1 tablet by mouth 2 (two) times daily.     Historical Provider, MD  insulin aspart  protamine- aspart (NOVOLOG MIX 70/30) (70-30) 100 UNIT/ML injection Inject 0.15 mLs (15 Units total) into the skin 2 (two) times daily with a meal. 02/04/16   Enid Baas, MD  insulin lispro (HUMALOG) 100 UNIT/ML injection Inject into the skin once. Sliding scale 0-99=0 units 100-149=2 units 150-199=3 units 200-249=4 units 250-299=5 units 300-349=6 units 350-399= 7 units 400-449=8 units    Historical Provider, MD  losartan (COZAAR) 50 MG tablet Take 50 mg by mouth daily.    Historical Provider, MD  magnesium oxide (MAG-OX) 400 MG tablet Take 400 mg by mouth 2 (two) times daily.    Historical Provider, MD  meclizine (ANTIVERT) 25 MG tablet Take 1 tablet (25 mg total) by mouth 3 (three) times daily. 01/16/16   Ramonita Lab, MD  Melatonin 10 MG TBDP Take 10 mg by mouth at bedtime.    Historical Provider, MD  Multiple Vitamins-Minerals (OCUVITE PO) Take 1 capsule by mouth daily.     Historical Provider, MD  naproxen sodium (ANAPROX) 220 MG tablet Take 220-440 mg by mouth 2 (two) times daily as needed (for pain).     Historical Provider, MD  niacin 500 MG tablet Take 500 mg by mouth daily.     Historical Provider, MD  Omega-3 Fatty Acids (FISH OIL) 1000 MG CPDR Take 1,200 mg by mouth daily.    Historical Provider, MD  polyethylene glycol (MIRALAX / GLYCOLAX) packet Take 17 g by mouth daily.    Historical Provider, MD  senna (SENOKOT) 8.6 MG TABS tablet Take 1 tablet by mouth 2 (two) times daily.    Historical Provider, MD  vitamin E 400 UNIT capsule Take 400 Units by mouth daily.    Historical Provider, MD    Allergies Patient has no known allergies.  Family History  Problem Relation Age of Onset  . Hyperlipidemia Mother   . Varicose Veins Mother   . Deep vein thrombosis Father   . Stroke Father   . Breast cancer Paternal Aunt   . Stroke Maternal Grandmother   . Stroke Paternal Grandmother     Social History Social History  Substance Use Topics  . Smoking status: Former Smoker     Packs/day: 0.50    Years: 2.00    Types: Cigarettes    Quit date: 11/18/1968  . Smokeless tobacco: Never Used  . Alcohol use No     Comment: seldom    Review of Systems Level 5 chart caveat; no further history available due to patient status.  ____________________________________________   PHYSICAL EXAM:  VITAL SIGNS: ED Triage Vitals  Enc Vitals Group     BP 02/06/16 1317 (!) 147/68     Pulse Rate 02/06/16 1317 95     Resp 02/06/16 1317 17     Temp 02/06/16 1317 98 F (36.7 C)     Temp Source 02/06/16 1317 Oral     SpO2 02/06/16 1317  95 %     Weight 02/06/16 1318 253 lb (114.8 kg)     Height 02/06/16 1318 5\' 6"  (1.676 m)     Head Circumference --      Peak Flow --      Pain Score 02/06/16 1318 0     Pain Loc --      Pain Edu? --      Excl. in GC? --     Constitutional: Alert But somewhat confused. Knows her name, unsure where she is, follows commands unsure of the date patient appears significantly deconditioned and at baseline not well Eyes: Conjunctivae are normal. PERRL. EOMI. Head: Atraumatic. Nose: No congestion/rhinnorhea. Mouth/Throat: Mucous membranes are moist.  Oropharynx non-erythematous. Neck: No stridor.   Nontender with no meningismus Cardiovascular: Normal rate, regular rhythm. Grossly normal heart sounds.  Good peripheral circulation. Respiratory: Normal respiratory effort.  No retractions. Lungs CTAB. Abdominal: Soft and nontender. No distention. No guarding no rebound morbidly obese Musculoskeletal: No lower extremity tenderness, no upper extremity tenderness. No joint effusions, no DVT signs strong distal pulses no edema Neurologic:  A limited neurologic exam, patient with baseline left upper shoulder rate weakness noted, she will her toes otherwise bilateral lower extremities, will not lift either leg off the bed, no obvious focal facial lesions noted but again very limited exam patient not particular compliant. Speech is somewhat slurred  although it seems as if she has a waxing and waning ability to do this depending on her ability to focus. Skin:  . No rash noted. Psychiatric: Mood and affect are normal. Speech and behavior are normal.  ____________________________________________   LABS (all labs ordered are listed, but only abnormal results are displayed)  Labs Reviewed  COMPREHENSIVE METABOLIC PANEL - Abnormal; Notable for the following:       Result Value   Sodium 133 (*)    Chloride 93 (*)    CO2 33 (*)    Glucose, Bld 339 (*)    BUN 26 (*)    Creatinine, Ser 1.24 (*)    Calcium 10.7 (*)    GFR calc non Af Amer 43 (*)    GFR calc Af Amer 50 (*)    All other components within normal limits  CBC WITH DIFFERENTIAL/PLATELET - Abnormal; Notable for the following:    WBC 12.3 (*)    Neutro Abs 8.5 (*)    Monocytes Absolute 1.1 (*)    All other components within normal limits  URINALYSIS COMPLETEWITH MICROSCOPIC ([facility] ONLY) - Abnormal; Notable for the following:    Color, Urine AMBER (*)    APPearance HAZY (*)    Glucose, UA >500 (*)    Protein, ur 100 (*)    Squamous Epithelial / LPF 0-5 (*)    All other components within normal limits  URINE DRUG SCREEN, QUALITATIVE ([facility] ONLY) - Abnormal; Notable for the following:    Tricyclic, Ur Screen POSITIVE (*)    All other components within normal limits  URINE CULTURE  AMMONIA  TROPONIN I  PROTIME-INR   ____________________________________________  EKG  I personally interpreted any EKGs ordered by me or triage Sinus rhythm rate 94 bpm right bundle branch block noted no acute ischemic changes normal axis ____________________________________________  RADIOLOGY  I reviewed any imaging ordered by me or triage that were performed during my shift and, if possible, patient and/or family made aware of any abnormal findings. ____________________________________________   PROCEDURES  Procedure(s) performed: None  Procedures  Critical Care performed:  None  ____________________________________________  INITIAL IMPRESSION / ASSESSMENT AND PLAN / ED COURSE  Pertinent labs & imaging results that were available during my care of the patient were reviewed by me and considered in my medical decision making (see chart for details).  Patient presents again with some degree of confusion. She did have a recent CVA. She is not a candidate for TPA. [place] do not know a time of onset, is likely that this is a stroke. Discussed with Dr. Thad Ranger who agrees with management. She feels the patient should be admitted for repeat EEG to make sure that her prior frontal stroke is not inciting epileptiform activity. Patient in no acute distress. Workup otherwise reassuring CT scan doesn't show any conversion bleed, ammonia levels normal white count slightly elevated urinalysis does not show convincing urinary tract infection, electrolyte reassuring etc. We will discuss with the hospitalist in a anticipation of admission  Clinical Course    ____________________________________________   FINAL CLINICAL IMPRESSION(S) / ED DIAGNOSES  Final diagnoses:  Altered mental status      This chart was dictated using voice recognition software.  Despite best efforts to proofread,  errors can occur which can change meaning.      Jeanmarie Plant, MD 02/06/16 [phone]

## 2016-02-06 NOTE — ED Triage Notes (Signed)
Pt comes into the Ed via EMS [facility] [employer] [facility] with complaints from staff that pt began having slurred speech around 10am, pt was discharge yesterday with dx of CVA with left sided deficit..Marland Kitchen

## 2016-02-06 NOTE — ED Notes (Signed)
This RN called MRI to ask When pt was scheduled for MRI. MRI tech informed this nurse it would be tomorrow.

## 2016-02-06 NOTE — ED Notes (Signed)
MRI called this RN back and reports they will be performing the MRI today in approx. 40 minutes.

## 2016-02-07 ENCOUNTER — Inpatient Hospital Stay (HOSPITAL_COMMUNITY): Payer: Medicare Other

## 2016-02-07 DIAGNOSIS — R479 Unspecified speech disturbances: Secondary | ICD-10-CM

## 2016-02-07 DIAGNOSIS — G9340 Encephalopathy, unspecified: Secondary | ICD-10-CM | POA: Diagnosis not present

## 2016-02-07 DIAGNOSIS — R410 Disorientation, unspecified: Secondary | ICD-10-CM | POA: Diagnosis not present

## 2016-02-07 LAB — BASIC METABOLIC PANEL
ANION GAP: 9 (ref 5–15)
BUN: 24 mg/dL — ABNORMAL HIGH (ref 6–20)
CALCIUM: 10.2 mg/dL (ref 8.9–10.3)
CHLORIDE: 98 mmol/L — AB (ref 101–111)
CO2: 30 mmol/L (ref 22–32)
CREATININE: 1 mg/dL (ref 0.44–1.00)
GFR calc non Af Amer: 56 mL/min — ABNORMAL LOW (ref >60)
Glucose, Bld: 276 mg/dL — ABNORMAL HIGH (ref 65–99)
Potassium: 3.9 mmol/L (ref 3.5–5.1)
SODIUM: 137 mmol/L (ref 135–145)

## 2016-02-07 LAB — CBC
HCT: 40.9 % (ref 35.0–47.0)
HEMOGLOBIN: 13.8 g/dL (ref 12.0–16.0)
MCH: 31.5 pg (ref 26.0–34.0)
MCHC: 33.7 g/dL (ref 32.0–36.0)
MCV: 93.7 fL (ref 80.0–100.0)
PLATELETS: 274 10*3/uL (ref 150–440)
RBC: 4.37 MIL/uL (ref 3.80–5.20)
RDW: 14.3 % (ref 11.5–14.5)
WBC: 10.2 10*3/uL (ref 3.6–11.0)

## 2016-02-07 LAB — SEDIMENTATION RATE: Sed Rate: 33 mm/hr — ABNORMAL HIGH (ref 0–30)

## 2016-02-07 LAB — GLUCOSE, CAPILLARY
GLUCOSE-CAPILLARY: 273 mg/dL — AB (ref 65–99)
GLUCOSE-CAPILLARY: 255 mg/dL — AB (ref 65–99)
Glucose-Capillary: 301 mg/dL — ABNORMAL HIGH (ref 65–99)
Glucose-Capillary: 288 mg/dL — ABNORMAL HIGH (ref 65–99)

## 2016-02-07 LAB — URINE CULTURE: Culture: NO GROWTH

## 2016-02-07 LAB — C-REACTIVE PROTEIN: CRP: <0.8 mg/dL (ref <1.0)

## 2016-02-07 MED ORDER — INSULIN ASPART PROT & ASPART (70-30 MIX) 100 UNIT/ML ~~LOC~~ SUSP: 13.0000 [IU] | Freq: Two times a day (BID) | SUBCUTANEOUS | 11 refills | Status: DC

## 2016-02-07 MED ORDER — COLLAGENASE 250 UNIT/GM EX OINT
TOPICAL_OINTMENT | Freq: Every day | CUTANEOUS | Status: DC
  Administered 2016-02-07 – 2016-02-10 (×4): via TOPICAL
  Administered 2016-02-11: 1 via TOPICAL
  Filled 2016-02-07: qty 30

## 2016-02-07 MED ORDER — INSULIN DETEMIR 100 UNIT/ML ~~LOC~~ SOLN
20.0000 [IU] | Freq: Every day | SUBCUTANEOUS | Status: DC
  Administered 2016-02-07 – 2016-02-10 (×4): 20 [IU] via SUBCUTANEOUS
  Filled 2016-02-07 (×4): qty 0.2

## 2016-02-07 MED ORDER — LOSARTAN POTASSIUM 25 MG PO TABS
25.0000 mg | ORAL_TABLET | Freq: Every day | ORAL | Status: DC
  Administered 2016-02-07 – 2016-02-11 (×5): 25 mg via ORAL
  Filled 2016-02-07 (×5): qty 1

## 2016-02-07 NOTE — Progress Notes (Signed)
Inpatient Diabetes Program Recommendations  AACE/ADA: New Consensus Statement on Inpatient Glycemic Control (2015)  Target Ranges:  Prepandial:   less than 140 mg/dL      Peak postprandial:   less than 180 mg/dL (1-2 hours)      Critically ill patients:  140 - 180 mg/dL   Lab Results  Component Value Date   GLUCAP 273 (H) 02/07/2016   HGBA1C 6.7 (H) 02/02/2016    Review of Glycemic Control  Results for Molly [name], Cybele P (MRN [id number]) as of 02/07/2016 10:53  Ref. Range 02/04/2016 20:52 02/05/2016 07:32 02/06/2016 17:36 02/06/2016 21:24 02/07/2016 07:34  Glucose-Capillary Latest Ref Range: 65 - 99 mg/dL 409258 (H) 811239 (H) 914315 (H) 260 (H) 273 (H)    Diabetes history:DM2 Outpatient Diabetes medications: 70/30 15 units bid  Current orders for Inpatient glycemic control: Novolog 0-9 units TID with meals, Novolog 0-5 units QHS, Levemir 20 units qday  Inpatient Diabetes Program Recommendations:  Agree with current medications for blood sugar management.   Susette RacerJulie Sarah Baez, RN, BA, MHA, CDE Diabetes Coordinator Inpatient Diabetes Program  [phone] (Team Pager) [phone] [facility] Office) 02/07/2016 11:17 AM

## 2016-02-07 NOTE — Consult Note (Addendum)
Referring Physician: Sudini    Chief Complaint: Poorly responsive  HPI: Molly [name] is an 70 y.o. female with recent hosptialization for an acute left frontal infarct, discharged on Wednesday to rehab who was sent back to ED on yesterday due to slurred speech, confusion and poor responsiveness.  Husband was called and was unconvinced that anything was sos different but EMS was called and the patient was brought in for evaluation.  Initial NIHSS of 14.  Date last known well: Unable to determine Time last known well: Unable to determine tPA Given: No: Recent infarct, unknown LKW  Past Medical History:  Diagnosis Date  . Anxiety   . COPD (chronic obstructive pulmonary disease) ([place])   . Diabetes ([place])   . Hemorrhoid   . HLD (hyperlipidemia)   . HTN (hypertension)   . Stroke [facility]) 2008   left weakness    Past Surgical History:  Procedure Laterality Date  . AMPUTATION Left 05/22/2014   Procedure: AMPUTATION RAY LEFT FOURTH AND FIFTH ;  Surgeon: Wylene Simmer, MD;  Location: [place];  Service: Orthopedics;  Laterality: Left;  . CATARACT EXTRACTION    . COLONOSCOPY  2004    Family History  Problem Relation Age of Onset  . Hyperlipidemia Mother   . Varicose Veins Mother   . Deep vein thrombosis Father   . Stroke Father   . Breast cancer Paternal Aunt   . Stroke Maternal Grandmother   . Stroke Paternal Grandmother    Social History:  reports that she quit smoking about 47 years ago. Her smoking use included Cigarettes. She has a 1.00 pack-year smoking history. She has never used smokeless tobacco. She reports that she does not drink alcohol or use drugs.  Allergies: No Known Allergies  Medications:  I have reviewed the patient's current medications. Prior to Admission:  Prescriptions Prior to Admission  Medication Sig Dispense Refill Last Dose  . amitriptyline (ELAVIL) 50 MG tablet Take 50 mg by mouth daily.   02/06/2016 at 0951  . Ascorbic Acid (VITAMIN C) 1000 MG tablet Take  1,000 mg by mouth daily.   02/06/2016 at 0951  . aspirin EC 81 MG tablet Take 81 mg by mouth daily.   02/06/2016 at 0951  . atorvastatin (LIPITOR) 40 MG tablet Take 1 tablet (40 mg total) by mouth daily at 6 PM. 30 tablet 2 02/05/2016 at 2026  . B Complex-C (SUPER B COMPLEX PO) Take 1 tablet by mouth daily.    02/06/2016 at 0951  . bisacodyl (DULCOLAX) 5 MG EC tablet Take 1 tablet (5 mg total) by mouth daily as needed for moderate constipation. 30 tablet 0 prn at prn  . buPROPion (WELLBUTRIN XL) 300 MG 24 hr tablet Take 300 mg by mouth daily.   02/05/2016 at 1813  . calcium carbonate (OS-CAL) 600 MG TABS tablet Take 600 mg by mouth daily.   02/06/2016 at 0951  . Cholecalciferol (VITAMIN D3) 1000 UNITS CAPS Take 1,000 Units by mouth daily.   02/06/2016 at 0951  . clopidogrel (PLAVIX) 75 MG tablet Take 75 mg by mouth daily.   02/06/2016 at 0951  . Coenzyme Q10 (COQ10 PO) Take 1 capsule by mouth daily.    02/06/2016 at 0951  . docusate sodium (COLACE) 100 MG capsule Take 100 mg by mouth daily.   02/06/2016 at 0951  . GLUCOSAMINE-CHONDROITIN PO Take 1 tablet by mouth 2 (two) times daily.    02/06/2016 at 0951  . insulin lispro (HUMALOG) 100 UNIT/ML injection Inject into  the skin once. Sliding scale 0-99=0 units 100-149=2 units 150-199=3 units 200-249=4 units 250-299=5 units 300-349=6 units 350-399= 7 units 400-449=8 units   02/06/2016 at 0535  . magnesium oxide (MAG-OX) 400 MG tablet Take 400 mg by mouth 2 (two) times daily.   02/06/2016 at 0951  . meclizine (ANTIVERT) 25 MG tablet Take 1 tablet (25 mg total) by mouth 3 (three) times daily. 30 tablet 0 02/06/2016 at 0951  . Melatonin 10 MG TBDP Take 10 mg by mouth at bedtime.   02/05/2016 at 2026  . Multiple Vitamins-Minerals (OCUVITE PO) Take 1 capsule by mouth daily.    02/06/2016 at 0951  . naproxen sodium (ANAPROX) 220 MG tablet Take 220-440 mg by mouth 2 (two) times daily as needed (for pain).    prn at prn  . niacin 500 MG tablet Take 500 mg by mouth  daily.    02/06/2016 at 0951  . Omega-3 Fatty Acids (FISH OIL) 1000 MG CPDR Take 1,200 mg by mouth daily.   02/06/2016 at 0951  . polyethylene glycol (MIRALAX / GLYCOLAX) packet Take 17 g by mouth daily.   02/06/2016 at 0951  . senna (SENOKOT) 8.6 MG TABS tablet Take 1 tablet by mouth 2 (two) times daily.   prn at prn  . triamcinolone cream (KENALOG) 0.1 % Apply 1 application topically 2 (two) times daily.   02/05/2016 at 1812  . vitamin E 400 UNIT capsule Take 400 Units by mouth daily.   02/06/2016 at 0951  . collagenase (SANTYL) ointment Apply topically daily. (Patient not taking: Reported on 02/06/2016) 30 g 0 Not Taking at Unknown time  . insulin aspart protamine- aspart (NOVOLOG MIX 70/30) (70-30) 100 UNIT/ML injection Inject 0.15 mLs (15 Units total) into the skin 2 (two) times daily with a meal. (Patient not taking: Reported on 02/06/2016) 10 mL 11 Not Taking at Unknown time  . losartan (COZAAR) 50 MG tablet Take 50 mg by mouth daily.   Not Taking at Unknown time   Scheduled: . amitriptyline  50 mg Oral Daily  . aspirin EC  81 mg Oral Daily  . atorvastatin  40 mg Oral q1800  . buPROPion  300 mg Oral Daily  . calcium carbonate  500 mg Oral Daily  . clopidogrel  75 mg Oral Daily  . collagenase   Topical Daily  . docusate sodium  100 mg Oral Daily  . enoxaparin (LOVENOX) injection  40 mg Subcutaneous Q24H  . insulin aspart  0-5 Units Subcutaneous QHS  . insulin aspart  0-9 Units Subcutaneous TID WC  . insulin detemir  20 Units Subcutaneous Daily  . losartan  25 mg Oral Daily  . meclizine  25 mg Oral TID  . niacin  500 mg Oral Daily  . polyethylene glycol  17 g Oral Daily  . sodium chloride flush  3 mL Intravenous Q12H    ROS: History obtained from the patient  General ROS: negative for - chills, fatigue, fever, night sweats, weight gain or weight loss Psychological ROS: negative for - behavioral disorder, hallucinations, memory difficulties, mood swings or suicidal ideation Ophthalmic  ROS: negative for - blurry vision, double vision, eye pain or loss of vision ENT ROS: negative for - epistaxis, nasal discharge, oral lesions, sore throat, tinnitus or vertigo Allergy and Immunology ROS: negative for - hives or itchy/watery eyes Hematological and Lymphatic ROS: negative for - bleeding problems, bruising or swollen lymph nodes Endocrine ROS: negative for - galactorrhea, hair pattern changes, polydipsia/polyuria or temperature intolerance Respiratory  ROS: negative for - cough, hemoptysis, shortness of breath or wheezing Cardiovascular ROS: negative for - chest pain, dyspnea on exertion, edema or irregular heartbeat Gastrointestinal ROS: negative for - abdominal pain, diarrhea, hematemesis, nausea/vomiting or stool incontinence Genito-Urinary ROS: negative for - dysuria, hematuria, incontinence or urinary frequency/urgency Musculoskeletal ROS: left shoulder pain and decreased ROM Neurological ROS: as noted in HPI Dermatological ROS: negative for rash and skin lesion changes  Physical Examination: Blood pressure 127/63, pulse 84, temperature 98.5 F (36.9 C), temperature source Oral, resp. rate 18, height 5' 6" (1.676 m), weight 114.8 kg (253 lb), SpO2 97 %.  HEENT-  Normocephalic, no lesions, without obvious abnormality.  Normal external eye and conjunctiva.  Normal TM's bilaterally.  Normal auditory canals and external ears. Normal external nose, mucus membranes and septum.  Normal pharynx. Cardiovascular- S1, S2 normal, pulses palpable throughout   Lungs- chest clear, no wheezing, rales, normal symmetric air entry Abdomen- soft, non-tender; bowel sounds normal; no masses,  no organomegaly Extremities- LE edema Lymph-no adenopathy palpable Musculoskeletal-Left shoulder decreased ROM Skin-warm and dry, no hyperpigmentation, vitiligo, or suspicious lesions  Neurological Examination Alert, oriented to name and place but does not know the year or the month.  Speech fluent  although delayed without evidence of aphasia.  Able to follow 3 step commands without difficulty. Cranial Nerves: II: Discs flat bilaterally; Visual fields grossly normal, pupils equal, round, reactive to light and accommodation III,IV, VI: ptosis not present, extra-ocular motions intact bilaterally V,VII: decrease in right NLF, facial light touch sensation normal bilaterally VIII: hearing normal bilaterally IX,X: gag reflex present XI: bilateral shoulder shrug XII: midline tongue extension Motor: Right :  Upper extremity   5/5                                      Left:     Upper extremity   0/5             Lower extremity   3/5                                                  Lower extremity   3/5 Tone and bulk:normal tone throughout; no atrophy noted Sensory: Pinprick and light touch intact throughout, bilaterally Deep Tendon Reflexes: 2+ in the upper extremities, trace at the knee and absent at the ankles Plantars: Right: mute                              Left: mute Cerebellar: Normal finger-to-nose testing with the RUE Gait: not tested due to safety concerns    Laboratory Studies:  Basic Metabolic Panel:  Recent Labs Lab 02/01/16 1406 02/03/16 0529 02/06/16 1336 02/07/16 0400  NA 137 138 133* 137  K 4.6 4.2 5.1 3.9  CL 98* 98* 93* 98*  CO2 30 32 33* 30  GLUCOSE 76 190* 339* 276*  BUN 24* 20 26* 24*  CREATININE 0.93 1.08* 1.24* 1.00  CALCIUM 10.8* 9.7 10.7* 10.2    Liver Function Tests:  Recent Labs Lab 02/01/16 1406 02/06/16 1336  AST 36 31  ALT 42 44  ALKPHOS 66 60  BILITOT 0.9 0.8  PROT 7.7 7.7  ALBUMIN 3.8 3.8   No results for  input(s): LIPASE, AMYLASE in the last 168 hours.  Recent Labs Lab 02/06/16 1336  AMMONIA 14    CBC:  Recent Labs Lab 02/01/16 1406 02/03/16 0529 02/06/16 1336 02/07/16 0400  WBC 12.6* 9.6 12.3* 10.2  NEUTROABS  --   --  8.5*  --   HGB 14.3 13.4 14.1 13.8  HCT 42.6 39.5 42.3 40.9  MCV 93.9 94.1 93.7 93.7  PLT 367  304 315 274    Cardiac Enzymes:  Recent Labs Lab 02/01/16 1406 02/06/16 1336  TROPONINI <0.03 <0.03    BNP: Invalid input(s): POCBNP  CBG:  Recent Labs Lab 02/05/16 0732 02/06/16 1736 02/06/16 2124 02/07/16 0734 02/07/16 1144  GLUCAP 239* 315* 260* 273* 255*    Microbiology: Results for orders placed or performed during the hospital encounter of 02/01/16  MRSA PCR Screening     Status: None   Collection Time: 02/01/16 10:35 PM  Result Value Ref Range Status   MRSA by PCR NEGATIVE NEGATIVE Final    Comment:        The GeneXpert MRSA Assay (FDA approved for NASAL specimens only), is one component of a comprehensive MRSA colonization surveillance program. It is not intended to diagnose MRSA infection nor to guide or monitor treatment for MRSA infections.     Coagulation Studies:  Recent Labs  02/06/16 1336  LABPROT 13.6  INR 1.04    Urinalysis:  Recent Labs Lab 02/01/16 1436 02/06/16 1336  COLORURINE AMBER* AMBER*  LABSPEC 1.017 1.021  PHURINE 5.0 6.0  GLUCOSEU NEGATIVE >500*  HGBUR NEGATIVE NEGATIVE  BILIRUBINUR NEGATIVE NEGATIVE  KETONESUR NEGATIVE NEGATIVE  PROTEINUR 100* 100*  NITRITE NEGATIVE NEGATIVE  LEUKOCYTESUR NEGATIVE NEGATIVE    Lipid Panel:    Component Value Date/Time   CHOL 150 02/02/2016 0501   TRIG 196 (H) 02/02/2016 0501   HDL 30 (L) 02/02/2016 0501   CHOLHDL 5.0 02/02/2016 0501   VLDL 39 02/02/2016 0501   LDLCALC 81 02/02/2016 0501    HgbA1C:  Lab Results  Component Value Date   HGBA1C 6.7 (H) 02/02/2016    Urine Drug Screen:     Component Value Date/Time   LABOPIA NONE DETECTED 02/06/2016 1336   LABOPIA NONE DETECTED 10/15/2006 1244   COCAINSCRNUR NONE DETECTED 02/06/2016 1336   LABBENZ NONE DETECTED 02/06/2016 1336   LABBENZ NONE DETECTED 10/15/2006 1244   AMPHETMU NONE DETECTED 02/06/2016 1336   AMPHETMU NONE DETECTED 10/15/2006 1244   THCU NONE DETECTED 02/06/2016 1336   THCU NONE DETECTED  10/15/2006 1244   LABBARB NONE DETECTED 02/06/2016 1336   LABBARB  10/15/2006 1244    NONE DETECTED        DRUG SCREEN FOR MEDICAL PURPOSES ONLY.  IF CONFIRMATION IS NEEDED FOR ANY PURPOSE, NOTIFY LAB WITHIN 5 DAYS.    Alcohol Level:  Recent Labs Lab 02/01/16 [place] <5    Other results: EKG: sinus rhythm at 94 bpm.  Imaging: Ct Head Wo Contrast  Result Date: 02/06/2016 CLINICAL DATA:  Weakness EXAM: CT HEAD WITHOUT CONTRAST TECHNIQUE: Contiguous axial images were obtained from the base of the skull through the vertex without intravenous contrast. COMPARISON:  02/01/2016 FINDINGS: Brain: Moderate atrophy. Extensive chronic microvascular ischemic changes. Chronic infarct posterior medial temporal lobe on the right extending into the posterior limb internal capsule. This is unchanged. Negative for acute infarct, hemorrhage, or mass lesion. No shift of the midline structures. Vascular: No hyperdense vessel or unexpected calcification. Skull: Negative Sinuses/Orbits: Negative Other: None IMPRESSION: Atrophy and chronic ischemic  changes stable.  No acute abnormality. Electronically Signed   By: Franchot Gallo M.D.   On: 02/06/2016 14:08   Mr Brain Wo Contrast  Result Date: 02/06/2016 CLINICAL DATA:  70 year old diabetic hypertensive female with known recent left frontal lobe stroke presenting with slurred speech. Subsequent encounter. EXAM: MRI HEAD WITHOUT CONTRAST TECHNIQUE: Multiplanar, multiecho pulse sequences of the brain and surrounding structures were obtained without intravenous contrast. COMPARISON:  None. FINDINGS: Exam is motion degraded. Brain: Progressive enlargement/definition of anterior left frontal lobe/ anterior left opercular/anterior left subinsular infarct without associated hemorrhage. Prominent chronic microvascular changes. Remote left cerebellar infarct. Remote infarct posterior limb right internal capsule and medial right temporal lobe with wallerian degeneration.  Global atrophy without hydrocephalus. No intracranial mass lesion noted on this unenhanced exam. Vascular: Major intracranial vascular structures are patent. Skull and upper cervical spine: No acute abnormality. Hyperostosis frontalis interna. Sinuses/Orbits: No acute orbital abnormality. Post lens replacement. Minimal mucosal thickening ethmoid sinus air cells and mastoid air cells. Other: Negative. IMPRESSION: Exam is motion degraded. Progressive enlargement/definition of anterior left frontal lobe/ anterior left opercular/anterior left subinsular infarct without associated hemorrhage. Remainder of findings without change. Electronically Signed   By: Genia Del M.D.   On: 02/06/2016 17:01   Dg Chest Port 1 View  Result Date: 02/06/2016 CLINICAL DATA:  Slurred speech. EXAM: PORTABLE CHEST 1 VIEW COMPARISON:  11/19/2014. FINDINGS: Mild fullness of the superior mediastinum most likely from portable technique. Cardiomegaly with normal pulmonary vascularity. No focal infiltrate. No pleural effusion or pneumothorax. No acute bony abnormality. IMPRESSION: Mild cardiomegaly.  No CHF.  No acute pulmonary disease. Electronically Signed   By: Marcello Moores  Register   On: 02/06/2016 14:36    Assessment: 70 y.o. female presenting with difficulty with speech and confusion.  There was question as to whether this was a new ischemic infarct.  MRI of the brain repeated and shows enlargement of the previous infarct but no evidence of a new event.  No evidence of hemorrhage.  Patient on ASA and Plavix.  Recent work up unremarkable but patient has had multiple recent admissions for altered mental status.     Stroke Risk Factors - diabetes mellitus, hyperlipidemia and hypertension  Plan: 1. ESR, CRP 2. EEG 3. Schedule for holter/LINQ placement 4. Continue follow up with neurology as an outpatient   Alexis Goodell, MD Neurology [phone] 02/07/2016, 12:19 PM

## 2016-02-07 NOTE — Care Management Important Message (Signed)
Important Message  Patient Details  Name: Ardis HughsBrenda P Peifer MRN: [id number] Date of Birth: 09-Jun-1945   Medicare Important Message Given:  Yes    Chapman FitchBOWEN, Taylee Gunnells T, RN 02/07/2016, 12:58 PM

## 2016-02-07 NOTE — Progress Notes (Signed)
[facility] - [place] [facility]   PATIENT NAME: Molly DustmanBrenda [name]    MR#:  [id number]  DATE OF BIRTH:  1945/04/20  SUBJECTIVE:  CHIEF COMPLAINT:   Chief Complaint  Patient presents with  . Aphasia   Readmitted due to change in mental status and slurred speech. Husband at bedside. Has chronic left upper extremity weakness from prior stroke which is unchanged. Was at skilled nursing facility after recent discharge.  REVIEW OF SYSTEMS:  Review of Systems  Unable to perform ROS: Mental acuity    DRUG ALLERGIES:  No Known Allergies  VITALS:  Blood pressure 127/63, pulse 84, temperature 98.5 F (36.9 C), temperature source Oral, resp. rate 18, height 5\' 6"  (1.676 m), weight 114.8 kg (253 lb), SpO2 97 %.  PHYSICAL EXAMINATION:  Physical Exam  GENERAL:  70 y.o.-year-old Obese patient lying in the bed with no acute distress.  EYES: Pupils equal, round, reactive to light and accommodation. No scleral icterus. Extraocular muscles intact.  HEENT: Head atraumatic, normocephalic. Oropharynx and nasopharynx clear.  NECK:  Supple, no jugular venous distention. No thyroid enlargement, no tenderness.  LUNGS: Normal breath sounds bilaterally, no wheezing, rales,rhonchi or crepitation. No use of accessory muscles of respiration.  By basilar breath sounds CARDIOVASCULAR: S1, S2 normal. No murmurs, rubs, or gallops.  ABDOMEN: Soft, nontender, nondistended. Bowel sounds present. No organomegaly or mass.  EXTREMITIES: No pedal edema, cyanosis, or clubbing.  NEUROLOGIC: Cranial nerves II through XII are intact. Muscle strength 5/5 in all extremities except left upper extremity where the strength is 3/5. Decreased sensation in both feet due to neuropathy. PSYCHIATRIC: The patient is alert and oriented to self  SKIN: No obvious rash, lesion, or ulcer.    LABORATORY PANEL:   CBC  Recent Labs Lab 02/07/16 0400  WBC 10.2  HGB 13.8  HCT 40.9  PLT 274    ------------------------------------------------------------------------------------------------------------------  Chemistries   Recent Labs Lab 02/06/16 1336 02/07/16 0400  NA 133* 137  K 5.1 3.9  CL 93* 98*  CO2 33* 30  GLUCOSE 339* 276*  BUN 26* 24*  CREATININE 1.24* 1.00  CALCIUM 10.7* 10.2  AST 31  --   ALT 44  --   ALKPHOS 60  --   BILITOT 0.8  --    ------------------------------------------------------------------------------------------------------------------  Cardiac Enzymes  Recent Labs Lab 02/06/16 1336  TROPONINI <0.03   ------------------------------------------------------------------------------------------------------------------  RADIOLOGY:  Ct Head Wo Contrast  Result Date: 02/06/2016 CLINICAL DATA:  Weakness EXAM: CT HEAD WITHOUT CONTRAST TECHNIQUE: Contiguous axial images were obtained from the base of the skull through the vertex without intravenous contrast. COMPARISON:  02/01/2016 FINDINGS: Brain: Moderate atrophy. Extensive chronic microvascular ischemic changes. Chronic infarct posterior medial temporal lobe on the right extending into the posterior limb internal capsule. This is unchanged. Negative for acute infarct, hemorrhage, or mass lesion. No shift of the midline structures. Vascular: No hyperdense vessel or unexpected calcification. Skull: Negative Sinuses/Orbits: Negative Other: None IMPRESSION: Atrophy and chronic ischemic changes stable.  No acute abnormality. Electronically Signed   By: Marlan Palauharles  Clark M.D.   On: 02/06/2016 14:08   Mr Brain Wo Contrast  Result Date: 02/06/2016 CLINICAL DATA:  70 year old diabetic hypertensive female with known recent left frontal lobe stroke presenting with slurred speech. Subsequent encounter. EXAM: MRI HEAD WITHOUT CONTRAST TECHNIQUE: Multiplanar, multiecho pulse sequences of the brain and surrounding structures were obtained without intravenous contrast. COMPARISON:  None. FINDINGS: Exam is motion  degraded. Brain: Progressive enlargement/definition of anterior left frontal lobe/ anterior left  opercular/anterior left subinsular infarct without associated hemorrhage. Prominent chronic microvascular changes. Remote left cerebellar infarct. Remote infarct posterior limb right internal capsule and medial right temporal lobe with wallerian degeneration. Global atrophy without hydrocephalus. No intracranial mass lesion noted on this unenhanced exam. Vascular: Major intracranial vascular structures are patent. Skull and upper cervical spine: No acute abnormality. Hyperostosis frontalis interna. Sinuses/Orbits: No acute orbital abnormality. Post lens replacement. Minimal mucosal thickening ethmoid sinus air cells and mastoid air cells. Other: Negative. IMPRESSION: Exam is motion degraded. Progressive enlargement/definition of anterior left frontal lobe/ anterior left opercular/anterior left subinsular infarct without associated hemorrhage. Remainder of findings without change. Electronically Signed   By: Steven  Olson M.D.   On: 02/06/2016 17:01   Dg Chest Port 1 View  Result Date: 11Lacy Duverney/11/2015 CLINICAL DATA:  Slurred speech. EXAM: PORTABLE CHEST 1 VIEW COMPARISON:  11/19/2014. FINDINGS: Mild fullness of the superior mediastinum most likely from portable technique. Cardiomegaly with normal pulmonary vascularity. No focal infiltrate. No pleural effusion or pneumothorax. No acute bony abnormality. IMPRESSION: Mild cardiomegaly.  No CHF.  No acute pulmonary disease. Electronically Signed   By: Maisie Fushomas  Register   On: 02/06/2016 14:36    EKG:   Orders placed or performed during the hospital encounter of 02/06/16  . ED EKG  . ED EKG  . EKG 12-Lead  . 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  #1 Evolving Left frontal CVA MRI of the brain repeated  Also chronic microvascular ischemic disease changes. On aspirin, Plavix and statin. Consult neurology for further input. EEG pending. Physical therapy and speech consult -Recent further stroke workup including Dopplers, echo done with no significant findings. No need to repeat stroke workup. Patient has change in mental status likely due to recent stroke.  #2 hypertension-on losartan  #3 diabetes mellitus-on sliding scale insulin for now  #4 DVT prophylaxis-Lovenox   All the records are reviewed and case discussed with Care Management/Social Workerr. Management plans discussed with the patient, family and they are in agreement.  CODE STATUS: Full code  TOTAL TIME TAKING CARE OF THIS PATIENT: 35 minutes.   POSSIBLE D/C IN 1-2 DAYS, DEPENDING ON CLINICAL CONDITION.   Milagros LollSudini, Lailah Marcelli R M.D on 02/07/2016 at 10:44 AM  Between 7am to 6pm - Pager - [phone]  After 6pm go to [email or web address] - Social research officer, governmentpassword [email or web address] [email or web address]  [facility]  Office  [phone]  CC: Primary care physician; Elita Booneoberts, Caroline C, MD

## 2016-02-07 NOTE — Consult Note (Signed)
WOC Nurse wound consult note Reason for Consult:Left heel unstageable pressure injury.  Wound type:unstageable pressure injury.  Previous amputation to third and fourth toes.  Pressure Ulcer POA: Yes Measurement:2 cm x 2 cm wound bed 100% devitalized tissue Wound WUJ:[id number]:adherent slough Drainage (amount, consistency, odor) minimal serosanguinous  Musty odor.  Periwound:intact Dressing procedure/placement/frequency:Cleanse left heel with NS and pat gently dry.  Apply Santyl to wound bed.  Cover with NS moist gauze.  Cover with 4x4 gauze, kerlix and tape.  Change daily. Prevalon boot to offload pressure Will not follow at this time.  Please re-consult if needed.  Maple HudsonKaren Herve Haug RN BSN CWON Pager [phone]

## 2016-02-07 NOTE — Care Management (Signed)
[facility]  CSW notified.

## 2016-02-07 NOTE — [place] FL2 (Signed)
[place] MEDICAID FL2 LEVEL OF CARE SCREENING TOOL     IDENTIFICATION  Patient Name: Molly HughsBrenda P Pauley Birthdate: [birth year] Sex: female Admission Date (Current Location): 02/06/2016  [place]   Facility and Address:  [facility], [address], [place], [place] [address]      Provider Number: [id number]  Attending Physician Name and Address:  Milagros LollSrikar Sudini, MD  Relative Name and Phone Number:       Current Level of Care: Hospital Recommended Level of Care: Skilled Nursing Facility Prior Approval Number:    Date Approved/Denied:   PASRR Number: [id number]  Discharge Plan: SNF    Current Diagnoses: Patient Active Problem List   Diagnosis Date Noted  . Acute encephalopathy 02/06/2016  . Acute cerebrovascular accident (CVA) (HCC)   . Altered mental status   . 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 Cellulitis of left lower extremity 11/19/2014  . COPD (chronic obstructive pulmonary disease) (HCC) 11/19/2014  . HTN (hypertension) 11/19/2014  . HLD (hyperlipidemia) 11/19/2014  . Diabetes mellitus (HCC) 05/21/2014  . Chronic anemia 05/21/2014    Orientation RESPIRATION BLADDER Height & Weight     Self, Time, Situation, Place  Normal Incontinent Weight: 253 lb (114.8 kg) Height:  5\' 6"  (167.6 cm)  BEHAVIORAL SYMPTOMS/MOOD NEUROLOGICAL BOWEL NUTRITION STATUS   (none)  (none) Incontinent Diet (dysphagia 3)  AMBULATORY STATUS COMMUNICATION OF NEEDS Skin   Limited Assist Verbally PU Stage and Appropriate Care                       Personal Care Assistance Level of Assistance  Bathing, Dressing Bathing Assistance: Limited assistance Feeding assistance: Independent Dressing Assistance: Limited assistance     Functional Limitations Info  Sight, Hearing Sight Info: Adequate Hearing Info: Adequate Speech Info: Adequate    SPECIAL CARE FACTORS FREQUENCY  PT (By licensed PT)                    Contractures  Contractures Info: Not present    Additional Factors Info  Code Status, Allergies Code Status Info: full Allergies Info: nka           Current Medications (02/07/2016):  This is the current hospital active medication list Current Facility-Administered Medications  Medication Dose Route Frequency Provider Last Rate Last Dose  . acetaminophen (TYLENOL) tablet 650 mg  650 mg Oral Q6H PRN Wyatt Hasteavid K Hower, MD       Or  . acetaminophen (TYLENOL) suppository 650 mg  650 mg Rectal Q6H PRN Wyatt Hasteavid K Hower, MD      . amitriptyline (ELAVIL) tablet 50 mg  50 mg Oral Daily Wyatt Hasteavid K Hower, MD   50 mg at 02/07/16 1003  . aspirin EC tablet 81 mg  81 mg Oral Daily Wyatt Hasteavid K Hower, MD   81 mg at 02/07/16 1004  . atorvastatin (LIPITOR) tablet 40 mg  40 mg Oral q1800 Wyatt Hasteavid K Hower, MD      . buPROPion (WELLBUTRIN XL) 24 hr tablet 300 mg  300 mg Oral Daily Wyatt Hasteavid K Hower, MD   300 mg at 02/07/16 1218  . calcium carbonate (TUMS - dosed in mg elemental calcium) chewable tablet 500 mg  500 mg Oral Daily Wyatt Hasteavid K Hower, MD   500 mg at 02/07/16 1003  . clopidogrel (PLAVIX) tablet 75 mg  75 mg Oral Daily Wyatt Hasteavid K Hower, MD   75 mg at 02/07/16 1003  . collagenase (SANTYL) ointment  Topical Daily Srikar Sudini, MD      . docusate sodium (COLACE) capsule 100 mg  100 mg Oral Daily Wyatt Hasteavid K Hower, MD   100 mg at 02/07/16 1004  . enoxaparin (LOVENOX) injection 40 mg  40 mg Subcutaneous Q24H Wyatt Hasteavid K Hower, MD   40 mg at 02/06/16 2218  . insulin aspart (novoLOG) injection 0-5 Units  0-5 Units Subcutaneous QHS Wyatt Hasteavid K Hower, MD   3 Units at 02/06/16 2218  . insulin aspart (novoLOG) injection 0-9 Units  0-9 Units Subcutaneous TID WC Wyatt Hasteavid K Hower, MD   5 Units at 02/07/16 1218  . insulin detemir (LEVEMIR) injection 20 Units  20 Units Subcutaneous Daily Milagros LollSrikar Sudini, MD   20 Units at 02/07/16 1311  . losartan (COZAAR) tablet 25 mg  25 mg Oral Daily Milagros LollSrikar Sudini, MD   25 mg at 02/07/16 1003  . meclizine (ANTIVERT) tablet 25 mg  25  mg Oral TID Wyatt Hasteavid K Hower, MD   25 mg at 02/07/16 1003  . niacin tablet 500 mg  500 mg Oral Daily Wyatt Hasteavid K Hower, MD   500 mg at 02/07/16 1003  . ondansetron (ZOFRAN) tablet 4 mg  4 mg Oral Q6H PRN Wyatt Hasteavid K Hower, MD       Or  . ondansetron [facility](ZOFRAN) injection 4 mg  4 mg Intravenous Q6H PRN Wyatt Hasteavid K Hower, MD      . oxyCODONE (Oxy IR/ROXICODONE) immediate release tablet 5 mg  5 mg Oral Q4H PRN Wyatt Hasteavid K Hower, MD      . polyethylene glycol (MIRALAX / GLYCOLAX) packet 17 g  17 g Oral Daily Wyatt Hasteavid K Hower, MD   17 g at 02/07/16 1004  . sodium chloride flush (NS) 0.9 % injection 3 mL  3 mL Intravenous Q12H Wyatt Hasteavid K Hower, MD         Discharge Medications: Please see discharge summary for a list of discharge medications.  Relevant Imaging Results:  Relevant Lab Results:   Additional Information [id number]  York SpanielMonica Marra, LCSW

## 2016-02-07 NOTE — Clinical Social Work Note (Signed)
Clinical Social Work Assessment  Patient Details  Name: Molly HughsBrenda P Ulysse MRN: [id number] Date of Birth: 05/27/1945  Date of referral:  02/07/16               Reason for consult:  Facility Placement                Permission sought to share information with:  Facility Medical sales representativeContact Representative, Family Supports Permission granted to share information::  Yes, Verbal Permission Granted  Name::     [name],Frio Spouse   [phone]   Agency::  [facility]  Relationship::     Contact Information:     Housing/Transportation Living arrangements for the past 2 months:  Skilled Building surveyorursing Facility Source of Information:  Facility Patient Interpreter Needed:  None Criminal Activity/Legal Involvement Pertinent to Current Situation/Hospitalization:    Significant Relationships:  Spouse Lives with:  Facility Resident Do you feel safe going back to the place where you live?  Yes Need for family participation in patient care:  Yes (Comment)  Care giving concerns:  [facility] and plans to return back to facility.   Social Worker assessment / plan:  Patient is a 70 year old female who has dementia.  Patient is alert and oriented x1.  SNF stated she has been staying a facility for few months and is planning to transition to LTC.  Patient was confused and not very talkative when MSW attempted to complete assessment.  Patient has been in rehab before and plans to continue receiving PT and OT.  Employment status:  Retired Database administratornsurance information:  Managed Medicare PT Recommendations:  Skilled Nursing Facility Information / Referral to community resources:  Skilled Nursing Facility  Patient/Family's Response to care: Plan for patient to return to SNF.  Patient/Family's Understanding of and Emotional Response to Diagnosis, Current Treatment, and Prognosis:  Patient has dementia and was not able to express how she feels about her diagnosis and treatment plan.  Emotional  Assessment Appearance:  Appears stated age Attitude/Demeanor/Rapport:    Affect (typically observed):  Appropriate, Calm Orientation:  Oriented to Self Alcohol / Substance use:  Not Applicable Psych involvement (Current and /or in the community):  No (Comment)  Discharge Needs  Concerns to be addressed:  No discharge needs identified Readmission within the last 30 days:  Yes (02[facility]05-[facility]) Current discharge risk:  None Barriers to Discharge:  Continued Medical Work up   Darleene Cleavernterhaus, Glessie Eustice R 02/07/2016, 4:50 PM

## 2016-02-07 NOTE — Evaluation (Signed)
Clinical/Bedside Swallow Evaluation Patient Details  Name: Molly [name] MRN: [id number] Date of Birth: Apr 03, 1945  Today's Date: 02/07/2016 Time: SLP Start Time (ACUTE ONLY): 1230 SLP Stop Time (ACUTE ONLY): 1315 SLP Time Calculation (min) (ACUTE ONLY): 45 min  Past Medical History:  Past Medical History:  Diagnosis Date  . Anxiety   . COPD (chronic obstructive pulmonary disease) (HCC)   . Diabetes (HCC)   . Hemorrhoid   . HLD (hyperlipidemia)   . HTN (hypertension)   . Stroke [facility](HCC) 2008   left weakness   Past Surgical History:  Past Surgical History:  Procedure Laterality Date  . AMPUTATION Left 05/22/2014   Procedure: AMPUTATION RAY LEFT FOURTH AND FIFTH ;  Surgeon: Toni ArthursJohn Hewitt, MD;  [facility];  Service: Orthopedics;  Laterality: Left;  . CATARACT EXTRACTION    . COLONOSCOPY  2004   HPI:  Pt is a 70 y.o. female with multiple medical issues including anxiety, COPD, and other medical issues w/ a known history of recent left frontal stroke discharged yesterday from the hospital who is presenting with slurred speech - husband described as "not talking right in the head". [facility] at discharge yesterday and was even seen by Speech Therapy when noted to have slurred speech around 10 AM which is new according to family members. Patient was unable to provide meaningful information at admission yesterday but is now verbal and able to answer basic questions re: self. Husband stated that her speech "is better now than it has been in 3 weeks". She was awake/alert; answered basic y/n questions; followed few 1-step commands. She new she had returned to the hospital.     Assessment / Plan / Recommendation Clinical Impression  Pt appeared to adequately tolerate few trials of thin liquids and puree w/ no immediate, overt s/s of aspiration noted; trials were few but pt and husband stated they were waiting for the lunch tray. No coughing or decline in  respiratory status was noted; oral phase was wfl for bolus management of trials taken including manipulating pills in water for swallowing. Pt assisted in feeding self. She verbally interacted w/ SLP and husband/NSG w/ general responses(few); appeared more alert to task than at admission. Husband stated pt's speech was clearer than it had been in a few weeks but that she "still talks out of her head at times". Speech Therapy had made contact for assessment of pt at her admission to SNF yesterday and will be following her there. Recommended pt would greatly benefit from continued f/u w/ Speech Therapy at SNF when she returns after this admission to improve functional communication for ADLs in her home environment. Husband agreed. No Cognitive screening recommended at this setting as this will be reintiated at her SNF setting at discharge. Recommend continue w/ a Dysphagia 3 diet w/ thin liqudis w/ general aspiration precautions. Husband and pt agreed. NSG updated. Noted Neurology consult and findings during assessment.     Aspiration Risk   (reduced)    Diet Recommendation  Dysphagia 3 w/ thin liquids; general aspiration precautions.   Medication Administration: Whole meds with puree (as needed for easier swallowing)    Other  Recommendations Recommended Consults:  (TBD) Oral Care Recommendations: Oral care BID;Staff/trained caregiver to provide oral care   Follow up Recommendations Skilled Nursing facility (for ongoing Cognitive-Linguistic assessment/tx)      Frequency and Duration            Prognosis Prognosis for Safe Diet  Advancement: Good Barriers to Reach Goals: Cognitive deficits      Swallow Study   General Date of Onset: 02/06/16 HPI: Pt is a 70 y.o. female with multiple medical issues including anxiety, COPD, and other medical issues w/ a known history of recent left frontal stroke discharged yesterday from the hospital who is presenting with slurred speech - husband described as  "not talking right in the head". [facility] at discharge yesterday and was even seen by Speech Therapy when noted to have slurred speech around 10 AM which is new according to family members. Patient was unable to provide meaningful information at admission yesterday but is now verbal and able to answer basic questions re: self. Husband stated that her speech "is better now than it has been in 3 weeks". She was awake/alert; answered basic y/n questions; followed few 1-step commands. She new she had returned to the hospital.   Type of Study: Bedside Swallow Evaluation Previous Swallow Assessment: none Diet Prior to this Study: Dysphagia 3 (soft);Thin liquids Temperature Spikes Noted: No (wbc 10.2) Respiratory Status: Room air History of Recent Intubation: No Behavior/Cognition: Alert;Cooperative;Pleasant mood;Distractible;Requires cueing Oral Cavity Assessment: Within Functional Limits Oral Care Completed by SLP: Recent completion by staff Oral Cavity - Dentition: Missing dentition Vision: Functional for self-feeding Self-Feeding Abilities: Able to feed self;Needs assist;Needs set up Patient Positioning: Upright in bed Baseline Vocal Quality: Normal Volitional Cough: Strong Volitional Swallow: Able to elicit    Oral/Motor/Sensory Function Overall Oral Motor/Sensory Function: Within functional limits   Ice Chips Ice chips: Not tested   Thin Liquid Thin Liquid: Within functional limits Presentation: Self Fed;Straw Other Comments: pt also swallowed pills w/ water(thin liquid)    Nectar Thick Nectar Thick Liquid: Not tested   Honey Thick Honey Thick Liquid: Not tested   Puree Puree: Within functional limits Presentation: Spoon (fed; 3 trials) Other Comments: pt waiting for her lunch meal   Solid   GO   Solid: Not tested Other Comments: waiting for her lunch meal         Jerilynn SomKatherine Watson, MS, CCC-SLP Watson,Katherine 02/07/2016,3:48  PM

## 2016-02-07 NOTE — Plan of Care (Signed)
Problem: Nutrition: Goal: Risk of aspiration will decrease Outcome: Progressing Pt passed swallow study, seen by speech and now on DSY 3 diet.

## 2016-02-07 NOTE — Clinical Social Work Note (Signed)
[facility] is [facility] plan is to return back to facility once [facility] Authorization has been received and patient is medically ready for discharge and orders have been received.  MSW to continue to follow patient's progress throughout discharge planning.  Ervin KnackEric R. Hassan Rowannterhaus, MSW [phone]  Mon-Fri 8a-4:30p 02/07/2016 4:56 PM

## 2016-02-08 DIAGNOSIS — R410 Disorientation, unspecified: Secondary | ICD-10-CM | POA: Diagnosis not present

## 2016-02-08 DIAGNOSIS — G9340 Encephalopathy, unspecified: Secondary | ICD-10-CM | POA: Diagnosis not present

## 2016-02-08 LAB — GLUCOSE, CAPILLARY
GLUCOSE-CAPILLARY: 268 mg/dL — AB (ref 65–99)
GLUCOSE-CAPILLARY: 324 mg/dL — AB (ref 65–99)
GLUCOSE-CAPILLARY: 315 mg/dL — AB (ref 65–99)
Glucose-Capillary: 271 mg/dL — ABNORMAL HIGH (ref 65–99)

## 2016-02-08 MED ORDER — ENOXAPARIN SODIUM 40 MG/0.4ML ~~LOC~~ SOLN
40.0000 mg | Freq: Two times a day (BID) | SUBCUTANEOUS | Status: DC
  Administered 2016-02-08 – 2016-02-11 (×6): 40 mg via SUBCUTANEOUS
  Filled 2016-02-08 (×6): qty 0.4

## 2016-02-08 NOTE — Progress Notes (Signed)
[facility] [place] [facility]   PATIENT NAME: Dyane DustmanBrenda Riendeau    MR#:  [id number]  DATE OF BIRTH:  07/02/1945  SUBJECTIVE:  CHIEF COMPLAINT:   Chief Complaint  Patient presents with  . Aphasia   Readmitted due to change in mental status and slurred speech. Husband at bedside. Has chronic left upper extremity weakness from prior stroke which is unchanged. Was at skilled nursing facility after recent discharge. -Today pt is close to her baseline acc to her husband at bed side, has intermittent memory problems from recent CVA, intermittent episodes of lethargy  REVIEW OF SYSTEMS:  Review of Systems  Unable to perform ROS: Mental acuity    DRUG ALLERGIES:  No Known Allergies  VITALS:  Blood pressure 132/61, pulse 91, temperature 97.5 F (36.4 C), temperature source Oral, resp. rate 18, height 5\' 6"  (1.676 m), weight 114.8 kg (253 lb), SpO2 93 %.  PHYSICAL EXAMINATION:  Physical Exam  GENERAL:  70 y.o.-year-old Obese patient lying in the bed with no acute distress.  EYES: Pupils equal, round, reactive to light and accommodation. No scleral icterus. Extraocular muscles intact.  HEENT: Head atraumatic, normocephalic. Oropharynx and nasopharynx clear.  NECK:  Supple, no jugular venous distention. No thyroid enlargement, no tenderness.  LUNGS: Normal breath sounds bilaterally, no wheezing, rales,rhonchi or crepitation. No use of accessory muscles of respiration.  By basilar breath sounds CARDIOVASCULAR: S1, S2 normal. No murmurs, rubs, or gallops.  ABDOMEN: Soft, nontender, nondistended. Bowel sounds present. No organomegaly or mass.  EXTREMITIES: No pedal edema, cyanosis, or clubbing.  NEUROLOGIC: Cranial nerves II through XII are intact. Muscle strength 5/5 in all extremities except left upper extremity where the strength is 3/5. Decreased sensation in both feet due to neuropathy. PSYCHIATRIC: The patient is alert and oriented to self  SKIN: No obvious  rash, lesion, or ulcer.    LABORATORY PANEL:   CBC  Recent Labs Lab 02/07/16 0400  WBC 10.2  HGB 13.8  HCT 40.9  PLT 274   ------------------------------------------------------------------------------------------------------------------  Chemistries   Recent Labs Lab 02/06/16 1336 02/07/16 0400  NA 133* 137  K 5.1 3.9  CL 93* 98*  CO2 33* 30  GLUCOSE 339* 276*  BUN 26* 24*  CREATININE 1.24* 1.00  CALCIUM 10.7* 10.2  AST 31  --   ALT 44  --   ALKPHOS 60  --   BILITOT 0.8  --    ------------------------------------------------------------------------------------------------------------------  Cardiac Enzymes  Recent Labs Lab 02/06/16 1336  TROPONINI <0.03   ------------------------------------------------------------------------------------------------------------------  RADIOLOGY:  No results found.  EKG:   Orders placed or performed during the hospital encounter of 02/06/16  . ED EKG  . ED EKG  . EKG 12-Lead  . 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  #1 Evolving Left frontal CVA MRI of the brain repeated  Also chronic microvascular ischemic disease changes. On aspirin, Plavix and statin. Appreciate neurology recommendations EEG pending. Physical therapy- recs SNF speech consult - DYSphagia 3 diet with thin liquids and aspiration precautions -Recent further stroke workup including Dopplers, echo done with no significant findings. No need to repeat stroke workup. Patient has change in mental status likely due to recent stroke.  #2 hypertension-on losartan  #3 diabetes mellitus-on sliding scale insulin for now  #  4 DVT prophylaxis-Lovenox   All the records are reviewed and  case discussed with Care Management/Social Workerr. Management plans discussed with the patient, husband and they are in agreement.  CODE STATUS: Full code  TOTAL TIME TAKING CARE OF THIS PATIENT: 34 minutes.   POSSIBLE D/C IN 1-2 DAYS, to SNF after insurance approval   Ramonita LabGouru, Zacaria Pousson M.D on 02/08/2016 at 5:04 PM  Between 7am to 6pm - Pager - [phone]   After 6pm go to [email or web address] - Social research officer, governmentpassword EPAS [facility]  [facility] [place] [facility]  Office  [phone]  CC: Primary care physician; Elita Booneoberts, Caroline C, MD

## 2016-02-08 NOTE — Progress Notes (Signed)
Pharmacy Note - Anticoagulation  Patient with orders for enoxaparin 40mg  SQ Q24H for VTE prophylaxis  Estimated Creatinine Clearance: 67.4 mL/min (by C-G formula based on SCr of 1 mg/dL). Body mass index is 40.84 kg/m.  Will change to enoxaparin 40mg  SQ Q12H for BMI > 40 with CrCl > 30 per anticoagulation policy  Garlon HatchetJody Hadasah Brugger, PharmD Clinical Pharmacist  02/08/2016 2:31 PM

## 2016-02-09 DIAGNOSIS — R410 Disorientation, unspecified: Secondary | ICD-10-CM | POA: Diagnosis not present

## 2016-02-09 DIAGNOSIS — G9340 Encephalopathy, unspecified: Secondary | ICD-10-CM | POA: Diagnosis not present

## 2016-02-09 LAB — GLUCOSE, CAPILLARY
GLUCOSE-CAPILLARY: 281 mg/dL — AB (ref 65–99)
GLUCOSE-CAPILLARY: 225 mg/dL — AB (ref 65–99)
GLUCOSE-CAPILLARY: 286 mg/dL — AB (ref 65–99)
GLUCOSE-CAPILLARY: 254 mg/dL — AB (ref 65–99)

## 2016-02-09 NOTE — Care Management Important Message (Signed)
Important Message  Patient Details  Name: Ardis HughsBrenda P Bojarski MRN: [id number] Date of Birth: 10/04/1945   Medicare Important Message Given:  Yes    Mckenna Boruff A, RN 02/09/2016, 3:02 PM

## 2016-02-09 NOTE — Progress Notes (Signed)
[employer] - [place] at [facility]   PATIENT NAME: Molly DustmanBrenda [name]    MR#:  [id number]  DATE OF BIRTH:  [birth year]  SUBJECTIVE:  CHIEF COMPLAINT:   Chief Complaint  Patient presents with  . Aphasia   Readmitted due to change in mental status and slurred speech. Husband at bedside. Has chronic left upper extremity weakness from prior stroke which is unchanged. Was at skilled nursing facility after recent discharge. -Today pt is close to her baseline acc to her husband at bed side, has intermittent memory problems from recent CVA, intermittent episodes of lethargy improved  REVIEW OF SYSTEMS:  Review of Systems  Constitutional: Negative for chills and fever.  HENT: Negative for ear discharge, hearing loss and tinnitus.   Respiratory: Negative for hemoptysis, sputum production and shortness of breath.   Cardiovascular: Negative for chest pain and palpitations.  Gastrointestinal: Negative for abdominal pain, diarrhea and nausea.  Genitourinary: Negative for dysuria and frequency.  Musculoskeletal: Negative for back pain and neck pain.  Neurological: Negative for dizziness and tingling.  Psychiatric/Behavioral: Negative for substance abuse and suicidal ideas.    DRUG ALLERGIES:  No Known Allergies  VITALS:  Blood pressure 123/68, pulse 89, temperature 98 F (36.7 C), temperature source Oral, resp. rate 17, height 5\' 6"  (1.676 m), weight 114.8 kg (253 lb), SpO2 95 %.  PHYSICAL EXAMINATION:  Physical Exam  GENERAL:  70 y.o.-year-old Obese patient lying in the bed with no acute distress.  EYES: Pupils equal, round, reactive to light and accommodation. No scleral icterus. Extraocular muscles intact.  HEENT: Head atraumatic, normocephalic. Oropharynx and nasopharynx clear.  NECK:  Supple, no jugular venous distention. No thyroid enlargement, no tenderness.  LUNGS: Normal breath sounds bilaterally, no wheezing, rales,rhonchi or crepitation. No use of accessory  muscles of respiration.  By basilar breath sounds CARDIOVASCULAR: S1, S2 normal. No murmurs, rubs, or gallops.  ABDOMEN: Soft, nontender, nondistended. Bowel sounds present. No organomegaly or mass.  EXTREMITIES: No pedal edema, cyanosis, or clubbing.  NEUROLOGIC: Cranial nerves II through XII are intact. Muscle strength 5/5 in all extremities except left upper extremity where the strength is 3/5. Decreased sensation in both feet due to neuropathy. PSYCHIATRIC: The patient is alert and oriented to self  SKIN: No obvious rash, lesion, or ulcer.    LABORATORY PANEL:   CBC  Recent Labs Lab 02/07/16 0400  WBC 10.2  HGB 13.8  HCT 40.9  PLT 274   ------------------------------------------------------------------------------------------------------------------  Chemistries   Recent Labs Lab 02/06/16 1336 02/07/16 0400  NA 133* 137  K 5.1 3.9  CL 93* 98*  CO2 33* 30  GLUCOSE 339* 276*  BUN 26* 24*  CREATININE 1.24* 1.00  CALCIUM 10.7* 10.2  AST 31  --   ALT 44  --   ALKPHOS 60  --   BILITOT 0.8  --    ------------------------------------------------------------------------------------------------------------------  Cardiac Enzymes  Recent Labs Lab 02/06/16 1336  TROPONINI <0.03   ------------------------------------------------------------------------------------------------------------------  RADIOLOGY:  No results found.  EKG:   Orders placed or performed during the hospital encounter of 02/06/16  . ED EKG  . ED EKG  . EKG 12-Lead  . 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  #1 Evolving Left frontal CVA MRI of the brain repeated - chronic  microvascular ischemic disease changes. On aspirin,  Plavix and statin. Appreciate neurology recommendations  EEG pending. Physical therapy- recs SNF speech consult - DYSphagia 3 diet with thin liquids and aspiration precautions -Recent further stroke workup including Dopplers, echo done with no significant findings. No need to repeat stroke workup. Patient has change in mental status likely due to recent stroke.  #2 hypertension-on losartan  #3 diabetes mellitus-on sliding scale insulin for now  #4 DVT prophylaxis-Lovenox   All the records are reviewed and case discussed with Care Management/Social Workerr. Management plans discussed with the patient, husband and they are in agreement.  CODE STATUS: Full code  TOTAL TIME TAKING CARE OF THIS PATIENT: 33 minutes.   POSSIBLE D/C IN 1-2 DAYS, to SNF after insurance approval   Ramonita LabGouru, Khian Remo M.D on 02/09/2016 at 8:37 PM  Between 7am to 6pm - Pager - [phone]   After 6pm go to [email or web address] - Social research officer, governmentpassword EPAS [facility]  [facility] [place] [facility]  Office  [phone]  CC: Primary care physician; Elita Booneoberts, Caroline C, MD

## 2016-02-09 NOTE — Progress Notes (Signed)
PT Cancellation Note  Patient Details Name: Molly [name] MRN: [id number] DOB: 10-19-1945   Cancelled Treatment:    Reason Eval/Treat Not Completed: Patient's level of consciousness Attempted to see pt this afternoon, it took some shoulder shaking/sternal rubs to elicit any any response, but all verbalizations seemed to be only marginally voluntary.  She answered "yes" to "Are you awake?" but despite numerous attempts to encourage her to open her eyes or show any active movement/response she was unable.  Pt not appropriate for PT at this time, will try back tomorrow.  Malachi ProGalen R Najah Liverman, DPT 02/09/2016, 4:34 PM

## 2016-02-10 DIAGNOSIS — R410 Disorientation, unspecified: Secondary | ICD-10-CM | POA: Diagnosis not present

## 2016-02-10 DIAGNOSIS — G9340 Encephalopathy, unspecified: Secondary | ICD-10-CM | POA: Diagnosis not present

## 2016-02-10 LAB — GLUCOSE, CAPILLARY
GLUCOSE-CAPILLARY: 267 mg/dL — AB (ref 65–99)
GLUCOSE-CAPILLARY: 250 mg/dL — AB (ref 65–99)
GLUCOSE-CAPILLARY: 258 mg/dL — AB (ref 65–99)
Glucose-Capillary: 360 mg/dL — ABNORMAL HIGH (ref 65–99)

## 2016-02-10 LAB — CREATININE, SERUM: CREATININE: 0.93 mg/dL (ref 0.44–1.00)

## 2016-02-10 MED ORDER — INSULIN DETEMIR 100 UNIT/ML ~~LOC~~ SOLN
28.0000 [IU] | Freq: Every day | SUBCUTANEOUS | Status: DC
  Administered 2016-02-11: 09:00:00 28 [IU] via SUBCUTANEOUS
  Filled 2016-02-10: qty 0.28

## 2016-02-10 NOTE — Evaluation (Signed)
Physical Therapy Evaluation Patient Details Name: Molly [name] MRN: [id number] DOB: [birth year] Today's Date: 02/10/2016   History of Present Illness  Molly [name] is a 70 y.o. female was recently admitted 02/02/16 with L CVA. Readmitted from SNF for AMS evolution of L frontal lobe infarct with worsening of L side weakness, aphasia, and change in mental status. 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 Patient is cognitivity impaired with noted lethargy Previous admission  L M1 stenosis and chronic small vessel disease.  EF 60%.  Plan to return to SNF.   PMHx:  HTN, DM, CVA with L hemi, dizzness, falls.   Clinical Impression  Patient demonstrates decreased use on LLE and LUE secondary to L frontal lobe infarct. Patient demonstrates decreased sitting balance requiring CGA-minA for support. LUE mostly flaccid with trace shoulder flexion movement and fair hand grip strength. Significant tone along LUE Requires tactile and verbal cueing for LLE. Positioned patient with pillow underneath LUE with cloth positioned in hand for improved finger resting position. Patient requires frequent VC to stay on task throughout session. Did not perform transfers as patient is not compliant with LLE weight bearing status and decreased cognition. Recommend return to SNF to continue rehab after discharge from the hospital.     Follow Up Recommendations SNF    Equipment Recommendations  None recommended by PT    Recommendations for Other Services       Precautions / Restrictions Precautions Precautions: Fall Restrictions Weight Bearing Restrictions: Yes LLE Weight Bearing: Non weight bearing Other Position/Activity Restrictions: NWB LLE per NSG report      Mobility  Bed Mobility Overal bed mobility: Needs Assistance Bed Mobility: Supine to Sit;Sit to Supine     Supine to sit: Min assist;+2 for physical assistance Sit to supine: Min  assist;+2 for physical assistance   General bed mobility comments: Required increased cueing during transfer with minA with L LE.   Transfers                    Ambulation/Gait             General Gait Details: unable  Stairs            Wheelchair Mobility    Modified Rankin (Stroke Patients Only)       Balance Overall balance assessment: Needs assistance;History of Falls Sitting-balance support: Feet supported;Single extremity supported Sitting balance-Leahy Scale: Poor Sitting balance - Comments: Patient able to reach for objects with CGA assist which was initially fair/poor but improved over time Postural control: Posterior lean                                   Pertinent Vitals/Pain Pain Assessment: No/denies pain    Home Living Family/patient expects to be discharged to:: Skilled nursing facility Living Arrangements: Spouse/significant other Available Help at Discharge: Family Type of Home: House Home Access: Stairs to enter   [employer] Corporation[employer]trance Stairs-Number of Steps: 2 Home Layout: Multi-level;Able to live on main level with bedroom/bathroom Home Equipment: Gilmer MorCane - single point;Walker - standard;Wheelchair - manual;Bedside commode;Hand held shower head;Tub bench Additional Comments: husband helps her bathe using transfer tub bench    Prior Function Level of Independence: Needs assistance   Gait / Transfers Assistance Needed: not ambulatory since last admission to hospital  ADL's / Homemaking Assistance Needed: Pt is able to feed  herself with set up and cues and complete light grooming skills        Hand Dominance   Dominant Hand: Right    Extremity/Trunk Assessment   Upper Extremity Assessment: LUE deficits/detail       LUE Deficits / Details: Little to no voluntary movement with LUE. Moderate hand grip strength. Minimal voluntary shoulder flexion from the patient.   Lower Extremity Assessment: LLE deficits/detail   LLE  Deficits / Details: 4/5 strength throughout L LE. Chronic LLE weakness secondary to CVA. Fair quad control on L LE. with decreased hip adduction strength.  Cervical / Trunk Assessment: Kyphotic  Communication   Communication: Expressive difficulties  Cognition Arousal/Alertness: Awake/alert Behavior During Therapy: Flat affect Overall Cognitive Status: History of cognitive impairments - at baseline                      General Comments General comments (skin integrity, edema, etc.): Patient sat a side of bed for ~5 min with unilateral UE support on the RUE. Able to reach for objects without UE support. Increased posterior lean when in sitting.     Exercises Other Exercises Other Exercises: Ther Ex perform x10 on B LE: Glute sets, Quad sets, hip add with pillow between knees, and SLR; Patient required increased verbal and tactile cueing to perform exercises with appropriate form/technique.  Other Exercises: Sitting on side of the bed reaching for objects outside of BOS. Patient reaches with R UE and requires frequent verbal and tactile cueing to stay on task.    Assessment/Plan    PT Assessment Patient needs continued PT services  PT Problem List Decreased strength;Decreased range of motion;Decreased activity tolerance;Decreased balance;Decreased mobility;Decreased coordination;Obesity;Impaired tone;Decreased knowledge of use of DME;Decreased safety awareness;Decreased knowledge of precautions          PT Treatment Interventions DME instruction;Gait training;Therapeutic activities;Therapeutic exercise;Balance training;Manual techniques;Patient/family education;Neuromuscular re-education    PT Goals (Current goals can be found in the Care Plan section)  Acute Rehab PT Goals Patient Stated Goal: Return to SNF for rehab PT Goal Formulation: With patient/family Time For Goal Achievement: 02/17/16 Potential to Achieve Goals: Fair    Frequency Min 2X/week   Barriers to  discharge           End of Session   Activity Tolerance: Patient tolerated treatment well;Patient limited by fatigue Patient left: in bed;with call bell/phone within reach;with bed alarm set;with family/visitor present Nurse Communication: Mobility status         Time: [id number] PT Time Calculation (min) (ACUTE ONLY): 24 min   Charges:   PT Evaluation $PT Eval Moderate Complexity: 1 Procedure PT Treatments $Therapeutic Exercise: 8-22 mins   PT G Codes:        Myrene GalasWesley Makael Stein, PT DPT 02/10/2016, 9:40 AM

## 2016-02-10 NOTE — Progress Notes (Signed)
Chaplain provided a joint visit along with the chaplain assigned at this unit. Chaplains prayed for patient.

## 2016-02-10 NOTE — Progress Notes (Signed)
Inpatient Diabetes Program Recommendations  AACE/ADA: New Consensus Statement on Inpatient Glycemic Control (2015)  Target Ranges:  Prepandial:   less than 140 mg/dL      Peak postprandial:   less than 180 mg/dL (1-2 hours)      Critically ill patients:  140 - 180 mg/dL   Results for Molly [name], Molly [name] (MRN [id number]) as of 02/10/2016 08:04  Ref. Range 02/09/2016 07:35 02/09/2016 12:03 02/09/2016 16:38 02/09/2016 22:03  Glucose-Capillary Latest Ref Range: 65 - 99 mg/dL 308281 (H) 657225 (H) 846286 (H) 254 (H)   Results for Molly [name], Molly [name] (MRN [id number]) as of 02/10/2016 08:04  Ref. Range 02/10/2016 08:00  Glucose-Capillary Latest Ref Range: 65 - 99 mg/dL 324267 (H)    Home DM Meds: 70/30 Insulin- 15 units BIDWC (pt not taking per pt report)       Humalog 0-8 units daily?  Current Insulin Orders: Levemir 20 units daily       Novolog Sensitive Correction Scale/ SSI (0-9 units) TID AC + HS      MD- Please consider the following in-hospital insulin adjustments:  1. Increase Levemir to 28 units daily (0.25 units/kg dosing based on weight of 115 kg)  2. Start Novolog Meal Coverage: Novolog 4 units TIDWC (hold if pt eats <50% of meal)     --Will follow patient during hospitalization--  Ambrose FinlandJeannine Johnston Shannell Mikkelsen RN, MSN, CDE Diabetes Coordinator Inpatient Glycemic Control Team Team Pager: [phone] (8a-5p)

## 2016-02-10 NOTE — Progress Notes (Signed)
Chaplain was making his rounds and visited with pt in room 125. Provided the ministry of prayer and spiritual support.    02/10/16 1511  Clinical Encounter Type  Visited With Patient  Visit Type Initial  Referral From Nurse  Spiritual Encounters  Spiritual Needs Prayer

## 2016-02-10 NOTE — Progress Notes (Signed)
LCSW following for patient to return to SNF when medically stable. [facility]. She requires insurance authorization for SNF through [employer]. LCSW has requested a PT consult and OT consult prior to discharge.    LCSW will send clinicals to facility in effort for placement.    Molly EmoryHannah Dorean Daniello LCSW, MSW Clinical Social Work: Optician, dispensingystem Wide [employer] Coverage for :  [phone]

## 2016-02-10 NOTE — Progress Notes (Signed)
Assumed care of patient at 1900 from Ellison Hughshris Bennett, RN. Bo McclintockBrewer,Freeland Pracht S, RN

## 2016-02-10 NOTE — Progress Notes (Signed)
[employer] - [place] at [facility]   PATIENT NAME: Molly DustmanBrenda [name]    MR#:  [id number]  DATE OF BIRTH:  1945-04-08  SUBJECTIVE:  CHIEF COMPLAINT:   Chief Complaint  Patient presents with  . Aphasia   Readmitted due to change in mental status and slurred speech. Husband at bedside. Has chronic left upper extremity weakness from prior stroke which is unchanged. Was at skilled nursing facility after recent discharge. -TodayNo overnight events according to the medical staff, pt is close to her baseline acc to her husband at bed side, has intermittent memory problems from recent CVA, intermittent episodes of lethargy improved.No other complaints  REVIEW OF SYSTEMS:  Review of Systems  Constitutional: Negative for chills and fever.  HENT: Negative for ear discharge, hearing loss and tinnitus.   Respiratory: Negative for hemoptysis, sputum production and shortness of breath.   Cardiovascular: Negative for chest pain and palpitations.  Gastrointestinal: Negative for abdominal pain, diarrhea and nausea.  Genitourinary: Negative for dysuria and frequency.  Musculoskeletal: Negative for back pain and neck pain.  Neurological: Negative for dizziness and tingling.  Psychiatric/Behavioral: Negative for substance abuse and suicidal ideas.    DRUG ALLERGIES:  No Known Allergies  VITALS:  Blood pressure (!) 119/95, pulse 85, temperature 97.9 F (36.6 C), temperature source Oral, resp. rate 20, height 5\' 6"  (1.676 m), weight 114.8 kg (253 lb), SpO2 93 %.  PHYSICAL EXAMINATION:  Physical Exam  GENERAL:  70 y.o.-year-old Obese patient lying in the bed with no acute distress.  EYES: Pupils equal, round, reactive to light and accommodation. No scleral icterus. Extraocular muscles intact.  HEENT: Head atraumatic, normocephalic. Oropharynx and nasopharynx clear.  NECK:  Supple, no jugular venous distention. No thyroid enlargement, no tenderness.  LUNGS: Normal breath sounds  bilaterally, no wheezing, rales,rhonchi or crepitation. No use of accessory muscles of respiration.  By basilar breath sounds CARDIOVASCULAR: S1, S2 normal. No murmurs, rubs, or gallops.  ABDOMEN: Soft, nontender, nondistended. Bowel sounds present. No organomegaly or mass.  EXTREMITIES: No pedal edema, cyanosis, or clubbing.  NEUROLOGIC: Cranial nerves II through XII are intact. Muscle strength 5/5 in all extremities except left upper extremity where the strength is 3/5. Decreased sensation in both feet due to neuropathy. PSYCHIATRIC: The patient is alert and oriented to self  SKIN: No obvious rash, lesion, or ulcer.    LABORATORY PANEL:   CBC  Recent Labs Lab 02/07/16 0400  WBC 10.2  HGB 13.8  HCT 40.9  PLT 274   ------------------------------------------------------------------------------------------------------------------  Chemistries   Recent Labs Lab 02/06/16 1336 02/07/16 0400 02/10/16 0306  NA 133* 137  --   K 5.1 3.9  --   CL 93* 98*  --   CO2 33* 30  --   GLUCOSE 339* 276*  --   BUN 26* 24*  --   CREATININE 1.24* 1.00 0.93  CALCIUM 10.7* 10.2  --   AST 31  --   --   ALT 44  --   --   ALKPHOS 60  --   --   BILITOT 0.8  --   --    ------------------------------------------------------------------------------------------------------------------  Cardiac Enzymes  Recent Labs Lab 02/06/16 1336  TROPONINI <0.03   ------------------------------------------------------------------------------------------------------------------  RADIOLOGY:  No results found.  EKG:   Orders placed or performed during the hospital encounter of 02/06/16  . ED EKG  . ED EKG  . EKG 12-Lead  . 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  #1 Evolving Left frontal CVA MRI of the brain repeated - chronic microvascular ischemic disease changes. On aspirin, Plavix and statin. Appreciate neurology recommendations  EEG pending. Physical therapy- recs SNF speech consult - DYSphagia 3 diet with thin liquids and aspiration precautions -Recent further stroke workup including Dopplers, echo done with no significant findings. No need to repeat stroke workup. Patient has change in mental status likely due to recent stroke.  #2 hypertension-on losartan  #3 diabetes mellitus-on sliding scale insuliN,, Levemir dose increased to 28 units NovoLog 4 units 3 times a day started  #4 DVT prophylaxis-Lovenox   All the records are reviewed and case discussed with Care Management/Social Workerr. Management plans discussed with the patient, husband and they are in agreement.  CODE STATUS: Full code  TOTAL TIME TAKING CARE OF THIS PATIENT: 33 minutes.   POSSIBLE D/C IN 1-2 DAYS, to SNF after insurance approval   Ramonita LabGouru, Yemariam Magar M.D on 02/10/2016 at 3:08 PM  Between 7am to 6pm - Pager - [phone]   After 6pm go to [email or web address] - Social research officer, governmentpassword EPAS [facility]  [facility] [place] [facility]  Office  [phone]  CC: Primary care physician; Elita Booneoberts, Caroline C, MD

## 2016-02-10 NOTE — Progress Notes (Signed)
Occupational Therapy Treatment Patient Details Name: Molly [name] MRN: [id number] DOB: [birth year] Today's Date: 02/10/2016    History of present illness Pt. is a 70 y.o. female who [facility] with AMS, Left Frontal Lobe Infarct, and worsening of Left sided weakness.   OT comments  Pt. Is a 70 year old female who [facility] with [facility]. Left Frontal Lobe Infarct, and worsening Left sided weakness. Pt. Presents with impaired vision, weakness, confusion, and impaired functional mobility. Pt. and family education was provided about positioning, positioning for meals, visual cues, and LUE and RUE functioning. Pt. could benefit from follow-up skilled OT services to work on improving ADL functioning.   Follow Up Recommendations  SNF    Equipment Recommendations       Recommendations for Other Services      Precautions / Restrictions Precautions Precautions: Fall Restrictions Weight Bearing Restrictions: Yes LLE Weight Bearing: Non weight bearing                                             ADL Overall ADL's : Needs assistance/impaired Eating/Feeding: Minimal assistance;Set up Eating/Feeding Details (indicate cue type and reason): visual cues were required. Grooming: Minimal assistance (With right hand)               Lower Body Dressing: Total assistance                 General ADL Comments: Pt. is total assist with LE ADLs.      Vision                     Perception     Praxis      Cognition   Behavior During Therapy: Flat affect Overall Cognitive Status: History of cognitive impairments - at baseline                       Extremity/Trunk Assessment  Upper Extremity Assessment Upper Extremity Assessment: LUE deficits/detail LUE Deficits / Details: No active volitional movement elicited.            Exercises     Shoulder Instructions       General Comments      Pertinent Vitals/  Pain          Home Living Family/patient expects to be discharged to:: Skilled nursing facility Living Arrangements: Spouse/significant other Available Help at Discharge: Family Type of Home: House Home Access: Stairs to enter     Home Layout: Multi-level     Bathroom Shower/Tub: Tub/shower unit Shower/tub characteristics: Curtain   Bathroom Accessibility: Yes   Home Equipment: Cane - single point;Walker - standard;Wheelchair - manual;Bedside commode;Hand held shower head;Tub bench          Prior Functioning/Environment Level of Independence: Needs assistance            Frequency  Min 1X/week        Progress Toward Goals  OT Goals(current goals can now be found in the care plan section)     Acute Rehab OT Goals Patient Stated Goal: [facility]. OT Goal Formulation: With patient Potential to Achieve Goals: Good  Plan      Co-evaluation                 End of Session  Activity Tolerance Patient tolerated treatment well   Patient Left in chair;with call bell/phone within reach;with chair alarm set   Nurse Communication          Time: [id number] OT Time Calculation (min): 18 min  Charges: OT General Charges $OT Visit: 1 Procedure OT Evaluation $OT Eval Low Complexity: 1 Procedure  Olegario MessierElaine Adrijana Haros, MS, OTR/L 02/10/2016, 2:17 PM

## 2016-02-11 DIAGNOSIS — R410 Disorientation, unspecified: Secondary | ICD-10-CM | POA: Diagnosis not present

## 2016-02-11 DIAGNOSIS — G9340 Encephalopathy, unspecified: Secondary | ICD-10-CM | POA: Diagnosis not present

## 2016-02-11 LAB — GLUCOSE, CAPILLARY
GLUCOSE-CAPILLARY: 282 mg/dL — AB (ref 65–99)
Glucose-Capillary: 263 mg/dL — ABNORMAL HIGH (ref 65–99)

## 2016-02-11 MED ORDER — INSULIN ASPART 100 UNIT/ML ~~LOC~~ SOLN: 4.0000 [IU] | Freq: Three times a day (TID) | SUBCUTANEOUS | 11 refills | Status: DC

## 2016-02-11 MED ORDER — INSULIN DETEMIR 100 UNIT/ML ~~LOC~~ SOLN: 28.0000 [IU] | Freq: Every day | SUBCUTANEOUS | 11 refills | Status: DC

## 2016-02-11 NOTE — Progress Notes (Signed)
Occupational Therapy Treatment Patient Details Name: Molly [name] MRN: [id number] DOB: 1945/09/25 Today's Date: 02/11/2016    History of present illness Pt. is a 70 y.o. female who [facility] with AMS, Left Frontal Lobe Infarct, and worsening of left sided weakness.   OT comments  Pt. requires maxAx2 for repositioning in midline. Pt./staff education was provided about LUE positioning. Pt. tolerated PROM to the left UE in all joint ranges. Pt. continues to benefit from skilled OT services for ADL training, neuromuscular re-ed., visual compensatory techniques, and positioning. Pt. Plans to go to SNF. Follow-up OT services are recommended.   Follow Up Recommendations  SNF    Equipment Recommendations       Recommendations for Other Services      Precautions / Restrictions Precautions Precautions: Fall Restrictions Weight Bearing Restrictions: Yes LLE Weight Bearing: Non weight bearing                                             ADL Overall ADL's : Needs assistance/impaired                                              Vision                     Perception     Praxis      Cognition   Behavior During Therapy: Restless Overall Cognitive Status: History of cognitive impairments - at baseline                       Extremity/Trunk Assessment               Exercises     Shoulder Instructions       General Comments      Pertinent Vitals/ Pain       Pain Assessment: 0-10 Pain Score: 0-No pain  Home Living                                          Prior Functioning/Environment              Frequency  Min 1X/week        Progress Toward Goals  OT Goals(current goals can now be found in the care plan section)     Acute Rehab OT Goals Patient Stated Goal: [facility]. OT Goal Formulation: With patient Potential to Achieve Goals:  Good  Plan Discharge plan remains appropriate    Co-evaluation                 End of Session     Activity Tolerance Patient tolerated treatment well   Patient Left in chair   Nurse Communication          Time: 1400-1430 OT Time Calculation (min): 30 min  Charges: OT General Charges $OT Visit: 1 Procedure OT Treatments $Self Care/Home Management : 23-37 mins  Olegario MessierElaine Shatana Saxton, MS, OTR/L 02/11/2016, 2:46 PM

## 2016-02-11 NOTE — Progress Notes (Addendum)
Inpatient Diabetes Program Recommendations  AACE/ADA: New Consensus Statement on Inpatient Glycemic Control (2015)  Target Ranges:  Prepandial:   less than 140 mg/dL      Peak postprandial:   less than 180 mg/dL (1-2 hours)      Critically ill patients:  140 - 180 mg/dL   Results for Ardis HughsGARNER, Nikya P (MRN [id number]) as of 02/11/2016 08:53  Ref. Range 02/10/2016 08:00 02/10/2016 11:30 02/10/2016 16:33 02/10/2016 21:08  Glucose-Capillary Latest Ref Range: 65 - 99 mg/dL 409267 (H) 811360 (H) 914250 (H) 258 (H)   Results for Ardis HughsGARNER, Maecy P (MRN [id number]) as of 02/11/2016 08:53  Ref. Range 02/11/2016 07:47  Glucose-Capillary Latest Ref Range: 65 - 99 mg/dL 086263 (H)    Home DM Meds: 70/30 Insulin- 15 units BIDWC (pt not taking per pt report)                             Humalog 0-8 units daily?  Current Insulin Orders: Levemir 28 units daily                                       Novolog Sensitive Correction Scale/ SSI (0-9 units) TID AC + HS      Note Levemir dose increased yesterday.  Patient will get increased dose today.  Patient still having issues with elevated postprandial glucose levels as well.     MD- Please consider the following in-hospital insulin adjustments:  Start Novolog Meal Coverage: Novolog 4 units TIDWC (hold if pt eats <50% of meal)    --Will follow patient during hospitalization--  Ambrose FinlandJeannine Johnston Nefi Musich RN, MSN, CDE Diabetes Coordinator Inpatient Glycemic Control Team Team Pager: [phone] (8a-5p)

## 2016-02-11 NOTE — Discharge Summary (Signed)
[facility] - [place] [facility]   PATIENT NAME: Molly [name]    MR#:  [id number]  DATE OF BIRTH:  1945/06/12  DATE OF ADMISSION:  02/06/2016 ADMITTING PHYSICIAN: Wyatt Haste, MD  DATE OF DISCHARGE: 02/11/2016 PRIMARY CARE PHYSICIAN: Elita Boone, MD    ADMISSION DIAGNOSIS:  Confusion [R41.0] Altered mental status [R41.82]  DISCHARGE DIAGNOSIS:  Active Problems:   Acute encephalopathy recent CVA  SECONDARY DIAGNOSIS:   Past Medical History:  Diagnosis Date  . Anxiety   . COPD (chronic obstructive pulmonary disease) (HCC)   . Diabetes (HCC)   . Hemorrhoid   . HLD (hyperlipidemia)   . HTN (hypertension)   . Stroke [facility]) 2008   left weakness    HOSPITAL COURSE:  Molly [name]  is a 70 y.o. female with a known history of Recent left frontal stroke discharged yesterday from the hospital who is presenting with slurred speech. Patient was at [facility] for rehabilitation noted to have slurred speech around 10 AM which is new according to family members. Patient is unable to provide meaningful information given current mental status please review history and physical for details  Hospital course  #1 Evolving Left frontal CVA MRI of the brain repeated - chronic microvascular ischemic disease changes. On aspirin, Plavix and statin. Appreciate neurology recommendations  EEG nml Physical therapy- recs SNF speech consult - DYSphagia 3 diet with thin liquids and aspiration precautions -Recent further stroke workup including Dopplers, echo done with no significant findings. No need to repeat stroke workup. Patient has change in mental status likely due to recent stroke.  #2 hypertension-on losartan  #3 diabetes mellitus-on sliding scale insuliN,, Levemir dose increased to 28 units NovoLog 4 units 3 times a day started  #4 DVT prophylaxis-Lovenox   DISCHARGE CONDITIONS:   fair  CONSULTS OBTAINED:  Treatment Team:  Kym Groom, MD Wyatt Haste, MD Thana Farr, MD   PROCEDURES none  DRUG ALLERGIES:  No Known Allergies  DISCHARGE MEDICATIONS:   Current Discharge Medication List    START taking these medications   Details  insulin aspart (NOVOLOG) 100 UNIT/ML injection Inject 4 Units into the skin 3 (three) times daily before meals. Qty: 10 mL, Refills: 11    insulin detemir (LEVEMIR) 100 UNIT/ML injection Inject 0.28 mLs (28 Units total) into the skin daily. Qty: 10 mL, Refills: 11      CONTINUE these medications which have NOT CHANGED   Details  amitriptyline (ELAVIL) 50 MG tablet Take 50 mg by mouth daily.    Ascorbic Acid (VITAMIN C) 1000 MG tablet Take 1,000 mg by mouth daily.    aspirin EC 81 MG tablet Take 81 mg by mouth daily.    atorvastatin (LIPITOR) 40 MG tablet Take 1 tablet (40 mg total) by mouth daily at 6 PM. Qty: 30 tablet, Refills: 2    B Complex-C (SUPER B COMPLEX PO) Take 1 tablet by mouth daily.     bisacodyl (DULCOLAX) 5 MG EC tablet Take 1 tablet (5 mg total) by mouth daily as needed for moderate constipation. Qty: 30 tablet, Refills: 0    buPROPion (WELLBUTRIN XL) 300 MG 24 hr tablet Take 300 mg by mouth daily.    calcium carbonate (OS-CAL) 600 MG TABS tablet Take 600 mg by mouth daily.    Cholecalciferol (VITAMIN D3) 1000 UNITS CAPS Take 1,000 Units by mouth daily.    clopidogrel (PLAVIX) 75 MG tablet Take 75 mg by mouth daily.  Coenzyme Q10 (COQ10 PO) Take 1 capsule by mouth daily.     docusate sodium (COLACE) 100 MG capsule Take 100 mg by mouth daily.    GLUCOSAMINE-CHONDROITIN PO Take 1 tablet by mouth 2 (two) times daily.     insulin lispro (HUMALOG) 100 UNIT/ML injection Inject into the skin once. Sliding scale 0-99=0 units 100-149=2 units 150-199=3 units 200-249=4 units 250-299=5 units 300-349=6 units 350-399= 7 units 400-449=8 units    magnesium oxide (MAG-OX) 400 MG tablet Take 400 mg by mouth 2 (two) times daily.    meclizine  (ANTIVERT) 25 MG tablet Take 1 tablet (25 mg total) by mouth 3 (three) times daily. Qty: 30 tablet, Refills: 0    Melatonin 10 MG TBDP Take 10 mg by mouth at bedtime.    Multiple Vitamins-Minerals (OCUVITE PO) Take 1 capsule by mouth daily.     niacin 500 MG tablet Take 500 mg by mouth daily.     Omega-3 Fatty Acids (FISH OIL) 1000 MG CPDR Take 1,200 mg by mouth daily.    polyethylene glycol (MIRALAX / GLYCOLAX) packet Take 17 g by mouth daily.    senna (SENOKOT) 8.6 MG TABS tablet Take 1 tablet by mouth 2 (two) times daily.    triamcinolone cream (KENALOG) 0.1 % Apply 1 application topically 2 (two) times daily.    vitamin E 400 UNIT capsule Take 400 Units by mouth daily.    losartan (COZAAR) 50 MG tablet Take 50 mg by mouth daily.      STOP taking these medications     naproxen sodium (ANAPROX) 220 MG tablet      collagenase (SANTYL) ointment      insulin aspart protamine- aspart (NOVOLOG MIX 70/30) (70-30) 100 UNIT/ML injection          DISCHARGE INSTRUCTIONS:   Follow-up with primary care physician at the facility in 3-4 days Follow-up with neurology in a month  DIET:  Cardiac diet, Diabetic  DISCHARGE CONDITION:  Fair  ACTIVITY:  Activity as toleratedPer PT recommendations  OXYGEN:  Home Oxygen: No.   Oxygen Delivery: room air  DISCHARGE LOCATION:  nursing home   If you experience worsening of your admission symptoms, develop shortness of breath, life threatening emergency, suicidal or homicidal thoughts you must seek medical attention immediately by calling 911 or calling your MD immediately  if symptoms less severe.  You Must read complete instructions/literature along with all the possible adverse reactions/side effects for all the Medicines you take and that have been prescribed to you. Take any new Medicines after you have completely understood and accpet all the possible adverse reactions/side effects.   Please note  You were cared for by a  hospitalist during your hospital stay. If you have any questions about your discharge medications or the care you received while you were in the hospital after you are discharged, you can call the unit and asked to speak with the hospitalist on call if the hospitalist that took care of you is not available. Once you are discharged, your primary care physician will handle any further medical issues. Please note that NO REFILLS for any discharge medications will be authorized once you are discharged, as it is imperative that you return to your primary care physician (or establish a relationship with a primary care physician if you do not have one) for your aftercare needs so that they can reassess your need for medications and monitor your lab values.     Today  Chief Complaint  Patient presents  with  . Aphasia   Patient is resting comfortably. Denies any complaints no overnight events. Answering most of the questions appropriately, still with some forgetfulness from recent stroke  ROS:  CONSTITUTIONAL: Denies fevers, chills. Denies any fatigue, weakness.  RESPIRATORY: Denies cough, wheeze, shortness of breath.  CARDIOVASCULAR: Denies chest pain, palpitations, edema.  GASTROINTESTINAL: Denies nausea, vomiting, diarrhea, abdominal pain. Denies bright red blood per rectum. GENITOURINARY: Denies dysuria, hematuria. ENDOCRINE: Denies nocturia or thyroid problems. HEMATOLOGIC AND LYMPHATIC: Denies easy bruising or bleeding. SKIN: Denies rash or lesion. MUSCULOSKELETAL: Denies pain in neck, back, shoulder, knees, hips or arthritic symptoms.  NEUROLOGIC: Denies paralysis, paresthesias. Left-sided weakness PSYCHIATRIC: Denies anxiety or depressive symptoms.   VITAL SIGNS:  Blood pressure (!) 125/111, pulse 93, temperature 98.7 F (37.1 C), temperature source Oral, resp. rate 20, height 5\' 6"  (1.676 m), weight 114.8 kg (253 lb), SpO2 95 %.  I/O:    Intake/Output Summary (Last 24 hours) at  02/11/16 1526 Last data filed at 02/11/16 1300  Gross per 24 hour  Intake              240 ml  Output                0 ml  Net              240 ml    PHYSICAL EXAMINATION:  GENERAL:  70 y.o.-year-old patient lying in the bed with no acute distress.  EYES: Pupils equal, round, reactive to light and accommodation. No scleral icterus. Extraocular muscles intact.  HEENT: Head atraumatic, normocephalic. Oropharynx and nasopharynx clear.  NECK:  Supple, no jugular venous distention. No thyroid enlargement, no tenderness.  LUNGS: Normal breath sounds bilaterally, no wheezing, rales,rhonchi or crepitation. No use of accessory muscles of respiration.  CARDIOVASCULAR: S1, S2 normal. No murmurs, rubs, or gallops.  ABDOMEN: Soft, non-tender, non-distended. Bowel sounds present. No organomegaly or mass.  EXTREMITIES: No pedal edema, cyanosis, or clubbing.  NEUROLOGIC: Cranial nerves II through XII are intact. Muscle strength 5/5 in all extremitiesExcept left upper extremity 4 out of 5. Sensation intact. Gait not checked.  PSYCHIATRIC: The patient is alert and oriented x2- 3.  SKIN: No obvious rash, lesion, or ulcer.   DATA REVIEW:   CBC  Recent Labs Lab 02/07/16 0400  WBC 10.2  HGB 13.8  HCT 40.9  PLT 274    Chemistries   Recent Labs Lab 02/06/16 1336 02/07/16 0400 02/10/16 0306  NA 133* 137  --   K 5.1 3.9  --   CL 93* 98*  --   CO2 33* 30  --   GLUCOSE 339* 276*  --   BUN 26* 24*  --   CREATININE 1.24* 1.00 0.93  CALCIUM 10.7* 10.2  --   AST 31  --   --   ALT 44  --   --   ALKPHOS 60  --   --   BILITOT 0.8  --   --     Cardiac Enzymes  Recent Labs Lab 02/06/16 1336  TROPONINI <0.03    Microbiology Results  Results for orders placed or performed during the hospital encounter of 02/06/16  Urine culture     Status: None   Collection Time: 02/06/16  1:36 PM  Result Value Ref Range Status   Specimen Description URINE, CATHETERIZED  Final   Special Requests NONE   Final   Culture NO GROWTH Performed at [facility] [place]   Final   Report Status 02/07/2016 FINAL  Final    RADIOLOGY:  No results found.  EKG:   Orders placed or performed during the hospital encounter of 02/06/16  . ED EKG  . ED EKG  . EKG 12-Lead  . EKG 12-Lead      Management plans discussed with the patient, family and they are in agreement.  CODE STATUS:     Code Status Orders        Start     Ordered   02/06/16 1532  Full code  Continuous     02/06/16 1531    Code Status History    Date Active Date Inactive Code Status Order ID Comments User Context   02/01/2016  9:17 PM 02/05/2016  1:59 PM Full Code [id number]  Auburn Bilberry, MD Inpatient   01/14/2016  2:01 PM 01/16/2016 10:22 PM Full Code [id number]  Katha Hamming, MD ED   11/19/2014 10:40 PM 11/21/2014  8:55 PM Full Code [id number]  Oralia Manis, MD Inpatient   05/22/2014  6:31 PM 05/25/2014  8:20 PM Full Code [id number]  Toni Arthurs, MD Inpatient   05/21/2014 10:31 PM 05/22/2014  6:31 PM Full Code [id number]  Yevonne Pax, MD Inpatient      TOTAL TIME TAKING CARE OF THIS PATIENT: 45  minutes.   Note: This dictation was prepared with Dragon dictation along with smaller phrase technology. Any transcriptional errors that result from this process are unintentional.   @[facility] @  on 02/11/2016 at 3:26 PM  Between 7am to 6pm - Pager - [phone]  After 6pm go to [email or web address] - password [email or web address] [facility]  [place] [facility]  Office  [phone]  CC: Primary care physician; Elita Boone, MD

## 2016-02-11 NOTE — Discharge Instructions (Signed)
Follow-up with primary care physician at the facility in 3-4 days Follow-up with neurology in a month

## 2016-02-11 NOTE — Progress Notes (Signed)
EM[facility]. Bo McclintockBrewer,Heavan Francom S, RN

## 2016-02-11 NOTE — Progress Notes (Addendum)
3:17 PM Facility has received authorization and can DC to SNF today. RN made aware and so was MD. Family at bedside and aware of plan. LCSW notified facility regarding sent paperwork.  No barriers to DC. No other needs.  Patient will transport by EMS. [facility]  1.26pm LCSW has [facility] that patient is able to be approved by contracted team for [facility]. [facility] and they are currently reviewing her clinicals. Contact CM for [employer] is laura. Will follow up with insurance once they call back with insurance approval/denial.  Barrier:  Remains to be insurance authorization. (Resolved:  Authorization has been approved by [employer]:  [id number] for three days with a RUG level of RUC for 608/therapy hours a week starting 11/14 and next review being 11/16. This authorization is good for three days.  New Barrier:  Facility contacted and refused to accept insurance auth from [employer]. Reports they have to receive it from [facility] and will reach out for authorization and call LCSW back once obtained.  Facility made aware this is the same authorization, but she declines to take patient back currently. Will await to hear from SNF. MD notified that patient has authorization and to discharge.    LCSW has attempted to expedite insurance authorization by submitting to [facility] for [facility] Authorization. All information has been faxed as consults and evaluations for therapies have been completed.  [facility] also been sent information and may still have to send authorization, but LCSW will attempt wit [facility] first. If denied and needing SNF to complete, LCSW will call Tiffany to complete.  Will continue to follow and assist with DC planning and disposition.  Molly EmoryHannah Weronika Birch LCSW, MSW Clinical Social Work: Optician, dispensingystem [employer] Coverage for :  [phone]

## 2016-02-11 NOTE — Progress Notes (Signed)
[facility] [place] [facility]. Bo McclintockBrewer,Leianne Callins S, RN

## 2016-02-11 NOTE — Progress Notes (Signed)
[facility] Bo McclintockBrewer,Rhian Asebedo S, RN

## 2016-02-11 NOTE — Progress Notes (Signed)
While rounding, CH made initial visit to room 125. The Pt presented very confused and was difficult to follow in conversation. CH provided the ministry of presence and prayer. CH is available for follow up as needed.    02/11/16 1500  Clinical Encounter Type  Visited With Patient;Health care provider  Visit Type Initial;Spiritual support  Referral From Nurse  Spiritual Encounters  Spiritual Needs Prayer

## 2016-03-05 ENCOUNTER — Inpatient Hospital Stay: Payer: Medicare HMO

## 2016-03-05 ENCOUNTER — Emergency Department: Payer: Medicare HMO

## 2016-03-05 ENCOUNTER — Inpatient Hospital Stay
Admission: EM | Admit: 2016-03-05 | Discharge: 2016-03-12 | DRG: 871 | Disposition: A | Discharge status: Skilled Nursing Facility | Payer: Medicare HMO | Attending: Internal Medicine | Admitting: Internal Medicine

## 2016-03-05 ENCOUNTER — Encounter: Payer: Self-pay | Admitting: Emergency Medicine

## 2016-03-05 DIAGNOSIS — N939 Abnormal uterine and vaginal bleeding, unspecified: Secondary | ICD-10-CM | POA: Diagnosis present

## 2016-03-05 DIAGNOSIS — E872 Acidosis: Secondary | ICD-10-CM | POA: Diagnosis present

## 2016-03-05 DIAGNOSIS — R609 Edema, unspecified: Secondary | ICD-10-CM

## 2016-03-05 DIAGNOSIS — A419 Sepsis, unspecified organism: Principal | ICD-10-CM | POA: Diagnosis present

## 2016-03-05 DIAGNOSIS — X58XXXA Exposure to other specified factors, initial encounter: Secondary | ICD-10-CM | POA: Diagnosis present

## 2016-03-05 DIAGNOSIS — Y92129 Unspecified place in nursing home as the place of occurrence of the external cause: Secondary | ICD-10-CM

## 2016-03-05 DIAGNOSIS — N179 Acute kidney failure, unspecified: Secondary | ICD-10-CM

## 2016-03-05 DIAGNOSIS — E875 Hyperkalemia: Secondary | ICD-10-CM | POA: Diagnosis present

## 2016-03-05 DIAGNOSIS — Z823 Family history of stroke: Secondary | ICD-10-CM

## 2016-03-05 DIAGNOSIS — R569 Unspecified convulsions: Secondary | ICD-10-CM | POA: Diagnosis not present

## 2016-03-05 DIAGNOSIS — E860 Dehydration: Secondary | ICD-10-CM | POA: Diagnosis present

## 2016-03-05 DIAGNOSIS — E785 Hyperlipidemia, unspecified: Secondary | ICD-10-CM | POA: Diagnosis present

## 2016-03-05 DIAGNOSIS — Z7401 Bed confinement status: Secondary | ICD-10-CM

## 2016-03-05 DIAGNOSIS — T17920A Food in respiratory tract, part unspecified causing asphyxiation, initial encounter: Secondary | ICD-10-CM | POA: Diagnosis present

## 2016-03-05 DIAGNOSIS — R509 Fever, unspecified: Secondary | ICD-10-CM

## 2016-03-05 DIAGNOSIS — Z803 Family history of malignant neoplasm of breast: Secondary | ICD-10-CM

## 2016-03-05 DIAGNOSIS — R5383 Other fatigue: Secondary | ICD-10-CM

## 2016-03-05 DIAGNOSIS — R259 Unspecified abnormal involuntary movements: Secondary | ICD-10-CM

## 2016-03-05 DIAGNOSIS — Z23 Encounter for immunization: Secondary | ICD-10-CM

## 2016-03-05 DIAGNOSIS — Z89422 Acquired absence of other left toe(s): Secondary | ICD-10-CM

## 2016-03-05 DIAGNOSIS — Z87891 Personal history of nicotine dependence: Secondary | ICD-10-CM

## 2016-03-05 DIAGNOSIS — Z6837 Body mass index (BMI) 37.0-37.9, adult: Secondary | ICD-10-CM

## 2016-03-05 DIAGNOSIS — I10 Essential (primary) hypertension: Secondary | ICD-10-CM | POA: Diagnosis present

## 2016-03-05 DIAGNOSIS — R252 Cramp and spasm: Secondary | ICD-10-CM | POA: Diagnosis not present

## 2016-03-05 DIAGNOSIS — R112 Nausea with vomiting, unspecified: Secondary | ICD-10-CM

## 2016-03-05 DIAGNOSIS — J9602 Acute respiratory failure with hypercapnia: Secondary | ICD-10-CM | POA: Diagnosis present

## 2016-03-05 DIAGNOSIS — J449 Chronic obstructive pulmonary disease, unspecified: Secondary | ICD-10-CM | POA: Diagnosis present

## 2016-03-05 DIAGNOSIS — I69354 Hemiplegia and hemiparesis following cerebral infarction affecting left non-dominant side: Secondary | ICD-10-CM

## 2016-03-05 DIAGNOSIS — R253 Fasciculation: Secondary | ICD-10-CM | POA: Diagnosis present

## 2016-03-05 DIAGNOSIS — K921 Melena: Secondary | ICD-10-CM | POA: Diagnosis present

## 2016-03-05 DIAGNOSIS — R6521 Severe sepsis with septic shock: Secondary | ICD-10-CM | POA: Diagnosis not present

## 2016-03-05 DIAGNOSIS — E079 Disorder of thyroid, unspecified: Secondary | ICD-10-CM | POA: Diagnosis present

## 2016-03-05 DIAGNOSIS — Z7902 Long term (current) use of antithrombotics/antiplatelets: Secondary | ICD-10-CM

## 2016-03-05 DIAGNOSIS — M6281 Muscle weakness (generalized): Secondary | ICD-10-CM

## 2016-03-05 DIAGNOSIS — I959 Hypotension, unspecified: Secondary | ICD-10-CM | POA: Diagnosis not present

## 2016-03-05 DIAGNOSIS — F419 Anxiety disorder, unspecified: Secondary | ICD-10-CM | POA: Diagnosis present

## 2016-03-05 DIAGNOSIS — E1165 Type 2 diabetes mellitus with hyperglycemia: Secondary | ICD-10-CM | POA: Diagnosis present

## 2016-03-05 DIAGNOSIS — R4182 Altered mental status, unspecified: Secondary | ICD-10-CM

## 2016-03-05 DIAGNOSIS — L89890 Pressure ulcer of other site, unstageable: Secondary | ICD-10-CM | POA: Diagnosis present

## 2016-03-05 DIAGNOSIS — K649 Unspecified hemorrhoids: Secondary | ICD-10-CM | POA: Diagnosis present

## 2016-03-05 DIAGNOSIS — Z79899 Other long term (current) drug therapy: Secondary | ICD-10-CM

## 2016-03-05 DIAGNOSIS — G9341 Metabolic encephalopathy: Secondary | ICD-10-CM | POA: Diagnosis present

## 2016-03-05 DIAGNOSIS — J9600 Acute respiratory failure, unspecified whether with hypoxia or hypercapnia: Secondary | ICD-10-CM | POA: Diagnosis present

## 2016-03-05 DIAGNOSIS — R7881 Bacteremia: Secondary | ICD-10-CM | POA: Diagnosis not present

## 2016-03-05 DIAGNOSIS — J9601 Acute respiratory failure with hypoxia: Secondary | ICD-10-CM | POA: Diagnosis not present

## 2016-03-05 DIAGNOSIS — Z7982 Long term (current) use of aspirin: Secondary | ICD-10-CM

## 2016-03-05 DIAGNOSIS — Z794 Long term (current) use of insulin: Secondary | ICD-10-CM

## 2016-03-05 LAB — CBC
HCT: 45.6 % (ref 35.0–47.0)
HEMOGLOBIN: 14.8 g/dL (ref 12.0–16.0)
MCH: 30.1 pg (ref 26.0–34.0)
MCHC: 32.4 g/dL (ref 32.0–36.0)
MCV: 92.8 fL (ref 80.0–100.0)
Platelets: 328 10*3/uL (ref 150–440)
RBC: 4.91 MIL/uL (ref 3.80–5.20)
RDW: 14.2 % (ref 11.5–14.5)
WBC: 29.9 10*3/uL — ABNORMAL HIGH (ref 3.6–11.0)

## 2016-03-05 LAB — COMPREHENSIVE METABOLIC PANEL
ALBUMIN: 3 g/dL — AB (ref 3.5–5.0)
ALT: 34 U/L (ref 14–54)
ANION GAP: 11 (ref 5–15)
AST: 27 U/L (ref 15–41)
Alkaline Phosphatase: 112 U/L (ref 38–126)
BUN: 42 mg/dL — ABNORMAL HIGH (ref 6–20)
CHLORIDE: 97 mmol/L — AB (ref 101–111)
CO2: 26 mmol/L (ref 22–32)
CREATININE: 2.1 mg/dL — AB (ref 0.44–1.00)
Calcium: 9.3 mg/dL (ref 8.9–10.3)
GFR calc non Af Amer: 23 mL/min — ABNORMAL LOW (ref >60)
GFR, EST AFRICAN AMERICAN: 26 mL/min — AB (ref >60)
GLUCOSE: 342 mg/dL — AB (ref 65–99)
Potassium: 5.3 mmol/L — ABNORMAL HIGH (ref 3.5–5.1)
SODIUM: 134 mmol/L — AB (ref 135–145)
Total Bilirubin: 1.2 mg/dL (ref 0.3–1.2)
Total Protein: 6.9 g/dL (ref 6.5–8.1)

## 2016-03-05 LAB — GLUCOSE, CAPILLARY
GLUCOSE-CAPILLARY: 343 mg/dL — AB (ref 65–99)
GLUCOSE-CAPILLARY: 245 mg/dL — AB (ref 65–99)
GLUCOSE-CAPILLARY: 259 mg/dL — AB (ref 65–99)
Glucose-Capillary: 226 mg/dL — ABNORMAL HIGH (ref 65–99)

## 2016-03-05 LAB — BLOOD GAS, ARTERIAL
ACID-BASE DEFICIT: 0.8 mmol/L (ref 0.0–2.0)
BICARBONATE: 25.4 mmol/L (ref 20.0–28.0)
Delivery systems: POSITIVE
EXPIRATORY PAP: 6
FIO2: 40
Inspiratory PAP: 12
O2 SAT: 98.4 %
PATIENT TEMPERATURE: 37
PCO2 ART: 47 mmHg (ref 32.0–48.0)
PH ART: 7.34 — AB (ref 7.350–7.450)
PO2 ART: 119 mmHg — AB (ref 83.0–108.0)

## 2016-03-05 LAB — URINALYSIS, COMPLETE (UACMP) WITH MICROSCOPIC
BILIRUBIN URINE: NEGATIVE
Bacteria, UA: NONE SEEN
Glucose, UA: 150 mg/dL — AB
HGB URINE DIPSTICK: NEGATIVE
Ketones, ur: 5 mg/dL — AB
Leukocytes, UA: NEGATIVE
Nitrite: NEGATIVE
PROTEIN: 100 mg/dL — AB
RBC / HPF: NONE SEEN RBC/hpf (ref 0–5)
Specific Gravity, Urine: 1.021 (ref 1.005–1.030)
pH: 5 (ref 5.0–8.0)

## 2016-03-05 LAB — LACTIC ACID, PLASMA
LACTIC ACID, VENOUS: 2.4 mmol/L — AB (ref 0.5–1.9)
Lactic Acid, Venous: 2.1 mmol/L (ref 0.5–1.9)
Lactic Acid, Venous: 2.6 mmol/L (ref 0.5–1.9)

## 2016-03-05 LAB — TYPE AND SCREEN
ABO/RH(D): A POS
Antibody Screen: NEGATIVE

## 2016-03-05 LAB — BASIC METABOLIC PANEL
Anion gap: 8 (ref 5–15)
BUN: 35 mg/dL — AB (ref 6–20)
CALCIUM: 8 mg/dL — AB (ref 8.9–10.3)
CO2: 25 mmol/L (ref 22–32)
CREATININE: 1.83 mg/dL — AB (ref 0.44–1.00)
Chloride: 103 mmol/L (ref 101–111)
GFR calc Af Amer: 31 mL/min — ABNORMAL LOW (ref >60)
GFR, EST NON AFRICAN AMERICAN: 27 mL/min — AB (ref >60)
GLUCOSE: 303 mg/dL — AB (ref 65–99)
Potassium: 5.1 mmol/L (ref 3.5–5.1)
Sodium: 136 mmol/L (ref 135–145)

## 2016-03-05 LAB — MRSA PCR SCREENING: MRSA BY PCR: POSITIVE — AB

## 2016-03-05 LAB — PROCALCITONIN: PROCALCITONIN: 11.89 ng/mL

## 2016-03-05 LAB — C DIFFICILE QUICK SCREEN W PCR REFLEX
C Diff antigen: NEGATIVE
C Diff interpretation: NOT DETECTED
C Diff toxin: NEGATIVE

## 2016-03-05 MED ORDER — ASPIRIN 300 MG RE SUPP
300.0000 mg | RECTAL | Status: DC
  Filled 2016-03-05: qty 1

## 2016-03-05 MED ORDER — INSULIN ASPART 100 UNIT/ML ~~LOC~~ SOLN
0.0000 [IU] | SUBCUTANEOUS | Status: DC
  Administered 2016-03-05 (×2): 11 [IU] via SUBCUTANEOUS
  Administered 2016-03-06: 4 [IU] via SUBCUTANEOUS
  Administered 2016-03-06: 3 [IU] via SUBCUTANEOUS
  Administered 2016-03-06 (×2): 4 [IU] via SUBCUTANEOUS
  Administered 2016-03-06: 7 [IU] via SUBCUTANEOUS
  Administered 2016-03-06: 4 [IU] via SUBCUTANEOUS
  Administered 2016-03-07: 11 [IU] via SUBCUTANEOUS
  Administered 2016-03-07 (×2): 3 [IU] via SUBCUTANEOUS
  Filled 2016-03-05 (×2): qty 4
  Filled 2016-03-05: qty 7
  Filled 2016-03-05: qty 3
  Filled 2016-03-05 (×2): qty 11
  Filled 2016-03-05: qty 3
  Filled 2016-03-05: qty 4
  Filled 2016-03-05: qty 3
  Filled 2016-03-05: qty 11
  Filled 2016-03-05: qty 4

## 2016-03-05 MED ORDER — IPRATROPIUM-ALBUTEROL 0.5-2.5 (3) MG/3ML IN SOLN: 3.0000 mL | RESPIRATORY_TRACT | Status: DC | PRN

## 2016-03-05 MED ORDER — PIPERACILLIN-TAZOBACTAM 3.375 G IVPB
3.3750 g | Freq: Three times a day (TID) | INTRAVENOUS | Status: DC
  Administered 2016-03-05 – 2016-03-09 (×12): 3.375 g via INTRAVENOUS
  Filled 2016-03-05 (×13): qty 50

## 2016-03-05 MED ORDER — SODIUM CHLORIDE 0.9 % IV SOLN: 250.0000 mL | INTRAVENOUS | Status: DC | PRN

## 2016-03-05 MED ORDER — STERILE WATER FOR INJECTION IJ SOLN
INTRAMUSCULAR | Status: AC
  Administered 2016-03-05: 10 mL
  Filled 2016-03-05: qty 10

## 2016-03-05 MED ORDER — ACETAMINOPHEN 325 MG PO TABS: 650.0000 mg | ORAL_TABLET | ORAL | Status: DC | PRN

## 2016-03-05 MED ORDER — PIPERACILLIN-TAZOBACTAM 3.375 G IVPB 30 MIN
3.3750 g | Freq: Once | INTRAVENOUS | Status: AC
  Administered 2016-03-05: 3.375 g via INTRAVENOUS
  Filled 2016-03-05: qty 50

## 2016-03-05 MED ORDER — PHENTOLAMINE MESYLATE 5 MG IJ SOLR
5.0000 mg | Freq: Once | INTRAMUSCULAR | Status: AC
  Administered 2016-03-05: 5 mg via SUBCUTANEOUS
  Filled 2016-03-05: qty 5

## 2016-03-05 MED ORDER — ONDANSETRON HCL 4 MG/2ML IJ SOLN: 4.0000 mg | Freq: Four times a day (QID) | INTRAMUSCULAR | Status: DC | PRN

## 2016-03-05 MED ORDER — SODIUM CHLORIDE 0.9 % IV SOLN
INTRAVENOUS | Status: DC
  Administered 2016-03-05 – 2016-03-07 (×3): via INTRAVENOUS
  Administered 2016-03-07: 125 mL/h via INTRAVENOUS

## 2016-03-05 MED ORDER — SODIUM CHLORIDE 0.9 % IV BOLUS (SEPSIS)
1000.0000 mL | Freq: Once | INTRAVENOUS | Status: AC
  Administered 2016-03-05: 1000 mL via INTRAVENOUS

## 2016-03-05 MED ORDER — ACETAMINOPHEN 650 MG RE SUPP
650.0000 mg | RECTAL | Status: DC | PRN
  Administered 2016-03-06: 650 mg via RECTAL
  Filled 2016-03-05: qty 1

## 2016-03-05 MED ORDER — SODIUM CHLORIDE 0.9 % IV BOLUS (SEPSIS)
1000.0000 mL | Freq: Once | INTRAVENOUS | Status: AC
Start: 2016-03-05 — End: 2016-03-05
  Administered 2016-03-05: 1000 mL via INTRAVENOUS

## 2016-03-05 MED ORDER — VANCOMYCIN HCL IN DEXTROSE 1-5 GM/200ML-% IV SOLN
1000.0000 mg | Freq: Once | INTRAVENOUS | Status: AC
  Administered 2016-03-05: 1000 mg via INTRAVENOUS
  Filled 2016-03-05: qty 200

## 2016-03-05 MED ORDER — NOREPINEPHRINE 4 MG/250ML-% IV SOLN
INTRAVENOUS | Status: AC
  Filled 2016-03-05: qty 250

## 2016-03-05 MED ORDER — NOREPINEPHRINE BITARTRATE 1 MG/ML IV SOLN
0.0000 ug/min | Freq: Once | INTRAVENOUS | Status: AC
  Administered 2016-03-05: 2 ug/min via INTRAVENOUS
  Filled 2016-03-05: qty 4

## 2016-03-05 NOTE — ED Notes (Signed)
MD notified of patient blood pressure.  Pressure bags and 3L fluids started.

## 2016-03-05 NOTE — Progress Notes (Signed)
Pharmacy Antibiotic Note  Molly [name] is a 70 y.o. female admitted on 03/05/2016 with sepsis.  Pharmacy has been consulted for Zosyn dosing. Patient received Zosyn 3.325 IV x 1 dose in ED.   Plan: Will start patient of Zosyn 3.375 IV EI every 8 hours.   Height: 5\' 3"  (160 cm) Weight: 253 lb (114.8 kg) IBW/kg (Calculated) : 52.4  Temp (24hrs), Avg:99.4 F (37.4 C), Min:99 F (37.2 C), Max:100.2 F (37.9 C)   Recent Labs Lab 03/05/16 1412  WBC 29.9*  CREATININE 2.10*  LATICACIDVEN 2.1*    Estimated Creatinine Clearance: 30.5 mL/min (by C-G formula based on SCr of 2.1 mg/dL (H)).    No Known Allergies  Antimicrobials this admission: 12/7 Zosyn >>   Dose adjustments this admission:   Microbiology results: 12/7 BCx:  12/7  UCx:   Thank you for allowing pharmacy to be a part of this patient's care.  Gardner CandleSheema M Endre Coutts, PharmD, BCPS Clinical Pharmacist 03/05/2016 4:53 PM

## 2016-03-05 NOTE — ED Notes (Signed)
ICU NP at bedside.

## 2016-03-05 NOTE — ED Notes (Signed)
Pt's BP noted to be 80/44. Changed dose of Norepi to 403mcg/min.

## 2016-03-05 NOTE — ED Triage Notes (Signed)
Pt comes into the ED via [facility] w/ c/o being unresponsive.  Patient responds to sternal rub.  NSR on monitor.  Patient had temp of 101 and CBG of >400.  Nursing facility gave 12 units humalog at 12:00.  Placed on 2L nasal cannula and sating at 95%.  Patient CBG w/ EMS at 392. Patient is an aspiration nrisk and has been on a thickened diet.

## 2016-03-05 NOTE — ED Provider Notes (Signed)
[facility] Emergency Department Provider Note ____________________________________________   I have reviewed the triage vital signs and the triage nursing note.  HISTORY  Chief Complaint unresponsive   Historian Limited 5 caveat, limited by patient's altered mental status History per EMS from the nursing home Additional study obtained from husband once he got here at the bedside  HPI Ardis HughsBrenda P Cardiff is a 70 y.o. female with a history of COPD, diabetes, morbid obesity, and stroke, brought in by EMS for decreased mental status. It sounds like this morning she was difficult to arouse. Husband states that she had a chest x-ray and had slightly low oxygen was placed on new cannula oxygen which is not typical for her at baseline. He states that she was reported to have had vomiting yesterday and today. He did not know if there is any diarrhea.  No reported fevers. Husband reports increased blood sugars over the past several weeks.    Past Medical History:  Diagnosis Date  . Anxiety   . COPD (chronic obstructive pulmonary disease) (HCC)   . Diabetes (HCC)   . Hemorrhoid   . HLD (hyperlipidemia)   . HTN (hypertension)   . Stroke [facility](HCC) 2008   left weakness    Patient Active Problem List   Diagnosis Date Noted  . Acute respiratory failure (HCC) 03/05/2016  . Acute encephalopathy 02/06/2016  . Acute cerebrovascular accident (CVA) (HCC)   . Altered mental status   . 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 Cellulitis of left lower extremity 11/19/2014  . COPD (chronic obstructive pulmonary disease) (HCC) 11/19/2014  . HTN (hypertension) 11/19/2014  . HLD (hyperlipidemia) 11/19/2014  . Diabetes mellitus (HCC) 05/21/2014  . Chronic anemia 05/21/2014    Past Surgical History:  Procedure Laterality Date  . AMPUTATION Left 05/22/2014   Procedure: AMPUTATION RAY LEFT FOURTH AND FIFTH ;  Surgeon: Toni ArthursJohn Hewitt, MD;  [facility];  Service: Orthopedics;  Laterality: Left;   . CATARACT EXTRACTION    . COLONOSCOPY  2004    Prior to Admission medications   Medication Sig Start Date End Date Taking? Authorizing Provider  acetaminophen (TYLENOL) 325 MG tablet Take 650 mg by mouth every 6 (six) hours as needed.   Yes Historical Provider, MD  amitriptyline (ELAVIL) 50 MG tablet Take 50 mg by mouth at bedtime.    Yes Historical Provider, MD  Ascorbic Acid (VITAMIN C) 1000 MG tablet Take 1,000 mg by mouth daily.   Yes Historical Provider, MD  aspirin EC 81 MG tablet Take 81 mg by mouth daily.   Yes Historical Provider, MD  atorvastatin (LIPITOR) 40 MG tablet Take 1 tablet (40 mg total) by mouth daily at 6 PM. 02/03/16  Yes Enid Baasadhika Kalisetti, MD  B Complex-C (SUPER B COMPLEX PO) Take 1 tablet by mouth daily.    Yes Historical Provider, MD  bisacodyl (DULCOLAX) 5 MG EC tablet Take 1 tablet (5 mg total) by mouth daily as needed for moderate constipation. 01/16/16  Yes Ramonita LabAruna Gouru, MD  buPROPion (WELLBUTRIN XL) 300 MG 24 hr tablet Take 300 mg by mouth daily.   Yes Historical Provider, MD  calcium carbonate (OS-CAL) 600 MG TABS tablet Take 600 mg by mouth daily.   Yes Historical Provider, MD  Cholecalciferol (VITAMIN D3) 1000 UNITS CAPS Take 1,000 Units by mouth daily.   Yes Historical Provider, MD  clopidogrel (PLAVIX) 75 MG tablet Take 75 mg by mouth daily.   Yes Historical Provider, MD  Coenzyme Q10 (COQ10 PO) Take 1 capsule  by mouth daily.    Yes Historical Provider, MD  diazepam (VALIUM) 5 MG tablet Take 2.5 mg by mouth every morning.   Yes Historical Provider, MD  GLUCOSAMINE-CHONDROITIN PO Take 1 tablet by mouth 2 (two) times daily.    Yes Historical Provider, MD  insulin detemir (LEVEMIR) 100 UNIT/ML injection Inject 0.28 mLs (28 Units total) into the skin daily. 02/12/16  Yes Ramonita LabAruna Gouru, MD  insulin lispro (HUMALOG) 100 UNIT/ML injection Inject into the skin once. Sliding scale 0-99=0 units 100-149=2 units 150-199=3 units 200-249=4 units 250-299=5 units 300-349=6  units 350-399= 7 units 400-449=8 units   Yes Historical Provider, MD  losartan (COZAAR) 50 MG tablet Take 50 mg by mouth daily.   Yes Historical Provider, MD  magnesium oxide (MAG-OX) 400 MG tablet Take 400 mg by mouth 2 (two) times daily.   Yes Historical Provider, MD  meclizine (ANTIVERT) 25 MG tablet Take 1 tablet (25 mg total) by mouth 3 (three) times daily. 01/16/16  Yes Ramonita LabAruna Gouru, MD  Melatonin 10 MG TBDP Take 10 mg by mouth at bedtime.   Yes Historical Provider, MD  Multiple Vitamins-Minerals (OCUVITE PO) Take 1 capsule by mouth daily.    Yes Historical Provider, MD  Omega-3 Fatty Acids (FISH OIL) 1000 MG CPDR Take 1,200 mg by mouth daily.   Yes Historical Provider, MD  omeprazole (PRILOSEC) 20 MG capsule Take 20 mg by mouth daily.   Yes Historical Provider, MD  polyethylene glycol (MIRALAX / GLYCOLAX) packet Take 17 g by mouth daily.   Yes Historical Provider, MD  senna (SENOKOT) 8.6 MG TABS tablet Take 1 tablet by mouth 2 (two) times daily.   Yes Historical Provider, MD  triamcinolone cream (KENALOG) 0.1 % Apply 1 application topically 2 (two) times daily.   Yes Historical Provider, MD  vitamin E 400 UNIT capsule Take 400 Units by mouth daily.   Yes Historical Provider, MD    No Known Allergies  Family History  Problem Relation Age of Onset  . Hyperlipidemia Mother   . Varicose Veins Mother   . Deep vein thrombosis Father   . Stroke Father   . Breast cancer Paternal Aunt   . Stroke Maternal Grandmother   . Stroke Paternal Grandmother     Social History Social History  Substance Use Topics  . Smoking status: Former Smoker    Packs/day: 0.50    Years: 2.00    Types: Cigarettes    Quit date: 11/18/1968  . Smokeless tobacco: Never Used  . Alcohol use No     Comment: seldom    Review of Systems Unable to obtain due to altered mental status ____________________________________________   PHYSICAL EXAM:  VITAL SIGNS: ED Triage Vitals  Enc Vitals Group     BP  03/05/16 1418 (!) 60/19     Pulse Rate 03/05/16 1414 (!) 108     Resp 03/05/16 1414 18     Temp 03/05/16 1414 99 F (37.2 C)     Temp Source 03/05/16 1414 Axillary     SpO2 03/05/16 1414 96 %     Weight 03/05/16 1415 253 lb (114.8 kg)     Height 03/05/16 1415 5\' 3"  (1.6 m)     Head Circumference --      Peak Flow --      Pain Score --      Pain Loc --      Pain Edu? --      Excl. in GC? --  Constitutional:Winces a little bit to sternal rub. When I open her eyes she does move her eyes towards me.  HEENT   Head: Normocephalic and atraumatic.      Eyes: Conjunctivae are normal. PERRL. Normal extraocular movements.      Ears:         Nose: No congestion/rhinnorhea.   Mouth/Throat: Mucous membranes are moderately dry.   Neck: No stridor. Cardiovascular/Chest: Normal rate, regular rhythm.  No murmurs, rubs, or gallops. Respiratory: Normal respiratory effort without tachypnea nor retractions. Moderate decreased breath sounds all around with poor air movement but not wheezing or ronchi. Gastrointestinal: Soft. Obese.  Some old appearing ecchymosis.  Not apparently tender, but patient does have decreased loc  Genitourinary/rectal:Deferred Musculoskeletal: No deformities.  No edema. Neurologic:  No facial droop.  Winces to sternal rub, follow command to stick tongue out. Skin:  Skin is warm, dry and intact. No rash noted.  ____________________________________________  LABS (pertinent positives/negatives)  Labs Reviewed  COMPREHENSIVE METABOLIC PANEL - Abnormal; Notable for the following:       Result Value   Sodium 134 (*)    Potassium 5.3 (*)    Chloride 97 (*)    Glucose, Bld 342 (*)    BUN 42 (*)    Creatinine, Ser 2.10 (*)    Albumin 3.0 (*)    GFR calc non Af Amer 23 (*)    GFR calc Af Amer 26 (*)    All other components within normal limits  CBC - Abnormal; Notable for the following:    WBC 29.9 (*)    All other components within normal limits  LACTIC  ACID, PLASMA - Abnormal; Notable for the following:    Lactic Acid, Venous 2.1 (*)    All other components within normal limits  URINALYSIS, COMPLETE (UACMP) WITH MICROSCOPIC - Abnormal; Notable for the following:    Color, Urine AMBER (*)    APPearance HAZY (*)    Glucose, UA 150 (*)    Ketones, ur 5 (*)    Protein, ur 100 (*)    Squamous Epithelial / LPF 0-5 (*)    All other components within normal limits  GLUCOSE, CAPILLARY - Abnormal; Notable for the following:    Glucose-Capillary 343 (*)    All other components within normal limits  CULTURE, BLOOD (ROUTINE X 2)  CULTURE, BLOOD (ROUTINE X 2)  URINE CULTURE  LACTIC ACID, PLASMA  BLOOD GAS, ARTERIAL  POC OCCULT BLOOD, ED  TYPE AND SCREEN    ____________________________________________    EKG I, Governor Rooks, MD, the attending physician have personally viewed and interpreted all ECGs.  98 bpm. Normal sinus rhythm with probable PACs.  Right Bundle-branch block. Undetermined rhythm. Nonspecific ST and T-wave ____________________________________________  RADIOLOGY All Xrays were viewed by me. Imaging interpreted by Radiologist.  Chest x-ray portable:  IMPRESSION: 1. Fullness in the upper mediastinum, this may be secondary to AP portable technique. PA and lateral chest x-ray can be obtained for further evaluation.  2. Mild cardiomegaly.  No focal pulmonary infiltrate. __________________________________________  PROCEDURES  Procedure(s) performed: None  Critical Care performed: CRITICAL CARE Performed by: Governor Rooks   Total critical care time: 60 minutes  Critical care time was exclusive of separately billable procedures and treating other patients.  Critical care was necessary to treat or prevent imminent or life-threatening deterioration.  Critical care was time spent personally by me on the following activities: development of treatment plan with patient and/or surrogate as well as nursing, discussions  with  consultants, evaluation of patient's response to treatment, examination of patient, obtaining history from patient or surrogate, ordering and performing treatments and interventions, ordering and review of laboratory studies, ordering and review of radiographic studies, pulse oximetry and re-evaluation of patient's condition.   ____________________________________________   ED COURSE / ASSESSMENT AND PLAN  Pertinent labs & imaging results that were available during my care of the patient were reviewed by me and considered in my medical decision making (see chart for details).  Patient was brought in by EMS from nursing home with altered mental status. She was found to be extremely hypotensive and 3 peripheral IVs were obtained with good IV access and 3 L normal saline boluses were started.  She does seem to be protecting her airway, although somnolent, she is responsive to sternal rub and will follow command to stick her tongue out. I think she is okay for BiPAP to assist her airway, as I'm concerned about possibility of some old hypercarbia given her history and obesity.  Concerning for sepsis with hypotension and altered mental status, patient was placed on the sepsis pathway and started on vancomycin and Zosyn. Blood cultures and urine culture sent. White blood count found to be significantly elevated.  On laboratory studies, he should also found to have acute renal failure, minimal elevated K+, patient has been hydrated.  I updated the patient's spouse.  Patient is at this point a full code. After fourth liter, blood pressure around 80 systolic, and patient was started on norepinephrine for blood pressure support.  I spoke with intensivist for admission.  BP 100-110 on levophed.    CONSULTATIONS:  Dr. Belia Heman, Intensivist for admission.  Patient / Family / Caregiver informed of clinical course, medical decision-making process, and agree with  plan.   ___________________________________________   FINAL CLINICAL IMPRESSION(S) / ED DIAGNOSES   Final diagnoses:  Sepsis, due to unspecified organism (HCC)  Altered mental status, unspecified altered mental status type  Acute renal failure, unspecified acute renal failure type [facility])              Note: This dictation was prepared with Dragon dictation. Any transcriptional errors that result from this process are unintentional    Governor Rooks, MD 03/05/16 1605

## 2016-03-05 NOTE — H&P (Signed)
PULMONARY / CRITICAL CARE MEDICINE   Name: Molly [name] MRN: [id number] DOB: 01/08/1946    ADMISSION DATE:  03/05/2016 CONSULTATION DATE:  03/05/16  REFERRING MD:  Dr. Shaune PollackLord  CHIEF COMPLAINT:  Festus AloeUnresponsice  HISTORY OF PRESENT ILLNESS:   This is a 70 yo female with a PMH of CVA, HTN, Hyperlipidemia, Hemorrhoids, Obesity,Type II DM, COPD, and Anxiety.  [facility].   According to pts husband she was difficult to arouse this morning, therefore EMS was notified.  Per ER notes the pts husband stated the p[facility] O2 sats [facility] today and she was subsequently placed on O2 via nasal canula.  He states she had a vomiting episode yesterday and today, however she did not have any other complaints.  Upon arrival to the ER she was minimally responsive and hypotensive she was placed on Bipap and given a total of 4L NS boluses for fluid resuscitation, however she remained hypotensive requiring levophed gtt. PCCM contacted to admit pt to ICU due to acute hypoxic hypercapnic respiratory failure requiring Bipap, hypotension secondary to septic shock unclear etiology, and acute encephalopathy secondary to sepsis.   PAST MEDICAL HISTORY :  She  has a past medical history of Anxiety; COPD (chronic obstructive pulmonary disease) (HCC); Diabetes (HCC); Hemorrhoid; HLD (hyperlipidemia); HTN (hypertension); and Stroke (HCC) (2008).  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  No Known Allergies  No current facility-administered medications on file prior to encounter.    Current Outpatient Prescriptions on File Prior to Encounter  Medication Sig  . amitriptyline (ELAVIL) 50 MG tablet Take 50 mg by mouth at bedtime.   . Ascorbic Acid (VITAMIN C) 1000 MG tablet Take 1,000 mg by mouth daily.  Marland Kitchen. aspirin EC 81 MG tablet Take 81 mg by mouth  daily.  Marland Kitchen. atorvastatin (LIPITOR) 40 MG tablet Take 1 tablet (40 mg total) by mouth daily at 6 PM.  . B Complex-C (SUPER B COMPLEX PO) Take 1 tablet by mouth daily.   . bisacodyl (DULCOLAX) 5 MG EC tablet Take 1 tablet (5 mg total) by mouth daily as needed for moderate constipation.  Marland Kitchen. buPROPion (WELLBUTRIN XL) 300 MG 24 hr tablet Take 300 mg by mouth daily.  . calcium carbonate (OS-CAL) 600 MG TABS tablet Take 600 mg by mouth daily.  . Cholecalciferol (VITAMIN D3) 1000 UNITS CAPS Take 1,000 Units by mouth daily.  . clopidogrel (PLAVIX) 75 MG tablet Take 75 mg by mouth daily.  . Coenzyme Q10 (COQ10 PO) Take 1 capsule by mouth daily.   Marland Kitchen. GLUCOSAMINE-CHONDROITIN PO Take 1 tablet by mouth 2 (two) times daily.   . insulin detemir (LEVEMIR) 100 UNIT/ML injection Inject 0.28 mLs (28 Units total) into the skin daily.  . insulin lispro (HUMALOG) 100 UNIT/ML injection Inject into the skin once. Sliding scale 0-99=0 units 100-149=2 units 150-199=3 units 200-249=4 units 250-299=5 units 300-349=6 units 350-399= 7 units 400-449=8 units  . losartan (COZAAR) 50 MG tablet Take 50 mg by mouth daily.  . magnesium oxide (MAG-OX) 400 MG tablet Take 400 mg by mouth 2 (two) times daily.  . meclizine (ANTIVERT) 25 MG tablet Take 1 tablet (25 mg total) by mouth 3 (three) times daily.  . Melatonin 10 MG TBDP Take 10 mg by mouth at bedtime.  . Multiple Vitamins-Minerals (OCUVITE PO) Take 1 capsule by mouth daily.   .Marland Kitchen  Omega-3 Fatty Acids (FISH OIL) 1000 MG CPDR Take 1,200 mg by mouth daily.  . polyethylene glycol (MIRALAX / GLYCOLAX) packet Take 17 g by mouth daily.  Marland Kitchen. senna (SENOKOT) 8.6 MG TABS tablet Take 1 tablet by mouth 2 (two) times daily.  Marland Kitchen. triamcinolone cream (KENALOG) 0.1 % Apply 1 application topically 2 (two) times daily.  . vitamin E 400 UNIT capsule Take 400 Units by mouth daily.    FAMILY HISTORY:  Her indicated that the status of her mother is unknown. She indicated that the status of her father  is unknown. She indicated that the status of her maternal grandmother is unknown. She indicated that the status of her paternal grandmother is unknown. She indicated that the status of her paternal aunt is unknown.    SOCIAL HISTORY: She  reports that she quit smoking about 47 years ago. Her smoking use included Cigarettes. She has a 1.00 pack-year smoking history. She has never used smokeless tobacco. She reports that she does not drink alcohol or use drugs.  REVIEW OF SYSTEMS:   Unable to assess pt on Bipap  SUBJECTIVE:  Pt currently on Bipap minimally responsive  VITAL SIGNS: BP 114/64   Pulse 85   Temp 99.1 F (37.3 C)   Resp 20   Ht 5\' 3"  (1.6 m)   Wt 253 lb (114.8 kg)   SpO2 100%   BMI 44.82 kg/m   HEMODYNAMICS:    VENTILATOR SETTINGS:    INTAKE / OUTPUT: No intake/output data recorded.  PHYSICAL EXAMINATION: General:  Acutely ill obese Caucasian female Neuro:  Somnolent follows simple commands, PERRL HEENT:  Supple, no JVD Cardiovascular:  NSR, s1s2, rrr, no M/R/G Lungs: diminished throughout, even, non labored on Bipap Abdomen:  +BS x4, soft, obese, non tender, non distended  Musculoskeletal:  Normal bulk and tone  Skin: Unstagable pressure ulcer left foot   LABS:  BMET  Recent Labs Lab 03/05/16 1412  NA 134*  K 5.3*  CL 97*  CO2 26  BUN 42*  CREATININE 2.10*  GLUCOSE 342*    Electrolytes  Recent Labs Lab 03/05/16 1412  CALCIUM 9.3    CBC  Recent Labs Lab 03/05/16 1412  WBC 29.9*  HGB 14.8  HCT 45.6  PLT 328    Coag's No results for input(s): APTT, INR in the last 168 hours.  Sepsis Markers  Recent Labs Lab 03/05/16 1412  LATICACIDVEN 2.1*    ABG No results for input(s): PHART, PCO2ART, PO2ART in the last 168 hours.  Liver Enzymes  Recent Labs Lab 03/05/16 1412  AST 27  ALT 34  ALKPHOS 112  BILITOT 1.2  ALBUMIN 3.0*    Cardiac Enzymes No results for input(s): TROPONINI, PROBNP in the last 168  hours.  Glucose  Recent Labs Lab 03/05/16 1414  GLUCAP 343*    Imaging Dg Chest Portable 1 View  Result Date: 03/05/2016 CLINICAL DATA:  Unresponsive.  Hypertension. EXAM: PORTABLE CHEST 1 VIEW COMPARISON:  02/16/2016. FINDINGS: Fullness is noted of the upper mediastinum, this may be secondary to AP portable technique. PA and lateral chest x-ray can be obtained to further evaluate . Cardiomegaly with normal pulmonary vascularity. No focal infiltrate. No pleural effusion. Diffuse degenerative change thoracic spine . IMPRESSION: 1. Fullness in the upper mediastinum, this may be secondary to AP portable technique. PA and lateral chest x-ray can be obtained for further evaluation. 2. Mild cardiomegaly.  No focal pulmonary infiltrate. Electronically Signed   By: Maisie Fushomas  Register   On:  03/05/2016 15:31   STUDIES:  None   CULTURES: Blood x2 12/7>> Urine 12/7>> Cdiff 12/7>>  ANTIBIOTICS: Vancomycin 12/7>>x1 dose Zosyn 12/7>>  SIGNIFICANT EVENTS: 12/7-Pt admitted to [facility] ICU due to acute hypoxic hypercapnic respiratory failure requiring Bipap, acute encephalopathy secondary to sepsis, hypotension secondary to sepsis of unclear etiology  LINES/TUBES: PIV's x2>>  ASSESSMENT / PLAN:  PULMONARY A: Acute hypoxic hypercapnic respiratory failure  Hx: COPD  P:   Continuous Bipap wean as tolerated Maintain O2 sats 88% to 92% Prn bronchodilators Prn ABG's Repeat CXR in am   CARDIOVASCULAR A:  Hypotension secondary to septic shock  Hx: HTN, CVA, and Hyperlipidemia P:  Hold outpatient aspirin, atorvastatin, plavix, and losartan  Continuous telemetry monitoring  Prn Levophed gtt to maintain map >65  RENAL A:   Acute renal failure  Hyperkalemia  P:   Trend BMP's Replace electrolytes as indicated Repeat BMP today Monitor UOP  GASTROINTESTINAL A:   Nausea Vomiting 1 episode Melena Diarrhea  Hx: Hemorrhoids   P:   Keep NPO for now  Prn Zofran for nausea and vomiting   IV Protonix for PUD prophylaxis  Cdiff results pending  KUB today 12/7  HEMATOLOGIC A:   Vaginal bleeding P:  Trend CBC's  Avoid chemical VTE prophylaxis  SCD's for VTE prophylaxis Monitor for s/sx of bleeding  If pt continues to have vaginal bleeding will consult OBGYN  INFECTIOUS A:   Leukocytosis Septic shock secondary to unknown etiology  P:   Trend WBC's and monitor fever curve Trend PCT's and lactic acid Continue abx as listed above Follow cultures   ENDOCRINE A:   Hyperglycemia  P:   SSI CBG's q4hrs  NEUROLOGIC A:   Acute encephalopathy secondary to sepsis  P:   Avoid sedating medications  Frequent reorientation Lights on during the day    FAMILY  - Updates: Pts husband and pastor updated about plan of care and questions answered 03/05/2016  - Inter-disciplinary family meet or Palliative Care meeting due by:  03/12/2016   Sonda Rumble, Juel Burrow  Pulmonary/Critical Care Pager [phone] (please enter 7 digits) PCCM Consult Pager [phone] (please enter 7 digits)

## 2016-03-05 NOTE — ED Notes (Signed)
CBG 343 

## 2016-03-05 NOTE — ED Notes (Signed)
Report called to Kelsey, RN.

## 2016-03-05 NOTE — Progress Notes (Signed)
The Nurse Pt Ch, asked to visit Pt in Rm09. CH visited Pt, but the medical team was evaluating the Pt. CH waited for the medical team to complete their evaluation, talked to the doctor, gather tnformation about the Pt, asked Dc if he could bring in spouse, identified spouse and brought him to family consult, prayed for spouse and Pt, then led spouse and Pt's pastor to Pt's Rm. Ch provided prayers and presence.       03/05/16 1600  Clinical Encounter Type  Visited With Patient;Patient and family together  Visit Type Initial;Spiritual support;ED;Trauma;Other (Comment)  Referral From Nurse  Consult/Referral To Chaplain  Spiritual Encounters  Spiritual Needs Prayer;Other (Comment)

## 2016-03-06 ENCOUNTER — Inpatient Hospital Stay: Payer: Medicare HMO

## 2016-03-06 DIAGNOSIS — R6521 Severe sepsis with septic shock: Secondary | ICD-10-CM

## 2016-03-06 DIAGNOSIS — A419 Sepsis, unspecified organism: Principal | ICD-10-CM

## 2016-03-06 DIAGNOSIS — J9601 Acute respiratory failure with hypoxia: Secondary | ICD-10-CM

## 2016-03-06 DIAGNOSIS — I959 Hypotension, unspecified: Secondary | ICD-10-CM

## 2016-03-06 LAB — GLUCOSE, CAPILLARY
GLUCOSE-CAPILLARY: 268 mg/dL — AB (ref 65–99)
GLUCOSE-CAPILLARY: 188 mg/dL — AB (ref 65–99)
GLUCOSE-CAPILLARY: 166 mg/dL — AB (ref 65–99)
GLUCOSE-CAPILLARY: 158 mg/dL — AB (ref 65–99)
GLUCOSE-CAPILLARY: 153 mg/dL — AB (ref 65–99)
Glucose-Capillary: 129 mg/dL — ABNORMAL HIGH (ref 65–99)

## 2016-03-06 LAB — BLOOD CULTURE ID PANEL (REFLEXED)
Acinetobacter baumannii: NOT DETECTED
CANDIDA TROPICALIS: NOT DETECTED
Candida albicans: NOT DETECTED
Candida glabrata: NOT DETECTED
Candida krusei: NOT DETECTED
Candida parapsilosis: NOT DETECTED
Enterobacter cloacae complex: NOT DETECTED
Enterobacteriaceae species: NOT DETECTED
Enterococcus species: NOT DETECTED
Escherichia coli: NOT DETECTED
HAEMOPHILUS INFLUENZAE: NOT DETECTED
KLEBSIELLA PNEUMONIAE: NOT DETECTED
Klebsiella oxytoca: NOT DETECTED
Listeria monocytogenes: NOT DETECTED
METHICILLIN RESISTANCE: DETECTED — AB
NEISSERIA MENINGITIDIS: NOT DETECTED
PROTEUS SPECIES: NOT DETECTED
Pseudomonas aeruginosa: NOT DETECTED
SERRATIA MARCESCENS: NOT DETECTED
STAPHYLOCOCCUS SPECIES: DETECTED — AB
STREPTOCOCCUS SPECIES: NOT DETECTED
Staphylococcus aureus (BCID): NOT DETECTED
Streptococcus agalactiae: NOT DETECTED
Streptococcus pneumoniae: NOT DETECTED
Streptococcus pyogenes: NOT DETECTED

## 2016-03-06 LAB — BASIC METABOLIC PANEL
ANION GAP: 4 — AB (ref 5–15)
BUN: 36 mg/dL — ABNORMAL HIGH (ref 6–20)
CHLORIDE: 106 mmol/L (ref 101–111)
CO2: 26 mmol/L (ref 22–32)
Calcium: 7.8 mg/dL — ABNORMAL LOW (ref 8.9–10.3)
Creatinine, Ser: 1.37 mg/dL — ABNORMAL HIGH (ref 0.44–1.00)
GFR calc non Af Amer: 38 mL/min — ABNORMAL LOW (ref >60)
GFR, EST AFRICAN AMERICAN: 44 mL/min — AB (ref >60)
GLUCOSE: 241 mg/dL — AB (ref 65–99)
Potassium: 4.6 mmol/L (ref 3.5–5.1)
Sodium: 136 mmol/L (ref 135–145)

## 2016-03-06 LAB — CBC
HEMATOCRIT: 37.5 % (ref 35.0–47.0)
HEMOGLOBIN: 12.3 g/dL (ref 12.0–16.0)
MCH: 30.5 pg (ref 26.0–34.0)
MCHC: 32.9 g/dL (ref 32.0–36.0)
MCV: 92.7 fL (ref 80.0–100.0)
Platelets: 247 10*3/uL (ref 150–440)
RBC: 4.04 MIL/uL (ref 3.80–5.20)
RDW: 14.3 % (ref 11.5–14.5)
WBC: 27.7 10*3/uL — AB (ref 3.6–11.0)

## 2016-03-06 LAB — PROCALCITONIN: Procalcitonin: 11.17 ng/mL

## 2016-03-06 LAB — URINE CULTURE: CULTURE: NO GROWTH

## 2016-03-06 LAB — INFLUENZA PANEL BY PCR (TYPE A & B)
INFLAPCR: NEGATIVE
INFLBPCR: NEGATIVE

## 2016-03-06 LAB — PHOSPHORUS: PHOSPHORUS: 3.3 mg/dL (ref 2.5–4.6)

## 2016-03-06 LAB — LACTIC ACID, PLASMA: LACTIC ACID, VENOUS: 1.4 mmol/L (ref 0.5–1.9)

## 2016-03-06 LAB — MAGNESIUM: Magnesium: 3.2 mg/dL — ABNORMAL HIGH (ref 1.7–2.4)

## 2016-03-06 MED ORDER — MUPIROCIN 2 % EX OINT
1.0000 "application " | TOPICAL_OINTMENT | Freq: Two times a day (BID) | CUTANEOUS | Status: AC
  Administered 2016-03-06 – 2016-03-10 (×10): 1 via NASAL
  Filled 2016-03-06: qty 22

## 2016-03-06 MED ORDER — CHLORHEXIDINE GLUCONATE 0.12 % MT SOLN
15.0000 mL | Freq: Two times a day (BID) | OROMUCOSAL | Status: DC
  Administered 2016-03-06 – 2016-03-07 (×3): 15 mL via OROMUCOSAL
  Filled 2016-03-06: qty 15

## 2016-03-06 MED ORDER — ORAL CARE MOUTH RINSE
15.0000 mL | Freq: Two times a day (BID) | OROMUCOSAL | Status: DC
  Administered 2016-03-06 – 2016-03-07 (×4): 15 mL via OROMUCOSAL

## 2016-03-06 MED ORDER — CHLORHEXIDINE GLUCONATE CLOTH 2 % EX PADS
6.0000 | MEDICATED_PAD | Freq: Every day | CUTANEOUS | Status: DC
  Administered 2016-03-05 – 2016-03-07 (×2): 6 via TOPICAL

## 2016-03-06 NOTE — Progress Notes (Signed)
Clinical Child psychotherapistocial Worker (CSW) received consult that [facility] is [facility]. Per Gavin Poundeborah [facility] is [facility] for [facility] term rehab and will require a new [employer] authorization to return. Per chart patient is currently on the Bi-pap. FL2 complete. CSW will continue to follow and assist as needed.   Baker Hughes IncorporatedBailey Kaniya Trueheart, LCSW [phone]

## 2016-03-06 NOTE — Progress Notes (Signed)
Inpatient Diabetes Program Recommendations  AACE/ADA: New Consensus Statement on Inpatient Glycemic Control (2015)  Target Ranges:  Prepandial:   less than 140 mg/dL      Peak postprandial:   less than 180 mg/dL (1-2 hours)      Critically ill patients:  140 - 180 mg/dL   Results for Ardis HughsGARNER, Molly [name] (MRN [id number]) as of 03/06/2016 10:48  Ref. Range 03/05/2016 14:14 03/05/2016 16:36 03/05/2016 20:01 03/05/2016 23:41 03/06/2016 03:50 03/06/2016 07:45  Glucose-Capillary Latest Ref Range: 65 - 99 mg/dL 469343 (H) 629245 (H) 528259 (H) 226 (H) 188 (H) 166 (H)   Review of Glycemic Control  Diabetes history: DM2 Outpatient Diabetes medications: Levemir 28 units daily, Humalog 0-8 units (unknown frequency) Current orders for Inpatient glycemic control: Novolog 0-20 units Q4H  Inpatient Diabetes Program Recommendations: Insulin - Basal: Glucose has ranged from 166-259 over the past 14 hours and patient has received a total of Novolog 26 units over the past 14 hours for correction. Please consider ordering Levemir 15 units Q24H (based on 107 kg x 0.15 units).  Thanks, Orlando PennerMarie Thoren Hosang, RN, MSN, CDE Diabetes Coordinator Inpatient Diabetes Program [phone] (Team Pager from 8am to 5pm)

## 2016-03-06 NOTE — Progress Notes (Signed)
Pharmacy Antibiotic Note  Molly [name] is a 70 y.o. female admitted on 03/05/2016 with sepsis.  Pharmacy has been consulted for Zosyn dosing.  Plan: Will continue  Zosyn 3.375 IV EI every 8 hours.   Height: 5\' 7"  (170.2 cm) Weight: 236 lb 8.9 oz (107.3 kg) IBW/kg (Calculated) : 61.6  Temp (24hrs), Avg:100 F (37.8 C), Min:99 F (37.2 C), Max:101.8 F (38.8 C)   Recent Labs Lab 03/05/16 1412 03/05/16 1855 03/05/16 2149 03/06/16 0100  WBC 29.9*  --   --  27.7*  CREATININE 2.10* 1.83*  --  1.37*  LATICACIDVEN 2.1* 2.4* 2.6* 1.4    Estimated Creatinine Clearance: 48.2 mL/min (by C-G formula based on SCr of 1.37 mg/dL (H)).    No Known Allergies  Antimicrobials this admission: 12/7 Zosyn >>   Dose adjustments this admission:   Microbiology results: 12/7 BCx:  12/7  UCx:   Thank you for allowing pharmacy to be a part of this patient's care.  Demetrius CharityJames,Tokiko Diefenderfer D, PharmD, BCPS Clinical Pharmacist 03/06/2016 12:44 PM

## 2016-03-06 NOTE — Progress Notes (Signed)
Chaplain rounded the unit to provide a compassionate presence and support to the patient. Patient appeared to be sleeping and silent prayer was said. No visitors were at the bedside. Chaplain Talynn Lebon [phone] 

## 2016-03-06 NOTE — NC FL2 (Signed)
MEDICAID FL2 LEVEL OF CARE SCREENING TOOL     IDENTIFICATION  Patient Name: Molly [name] Birthdate: 18-Dec-1945 Sex: female Admission Date (Current Location): 03/05/2016  [place]   Facility and Address:  [facility], [address], [place], [place] [address]      Provider Number: [id number]  Attending Physician Name and Address:  Erin FullingKurian Kasa, MD  Relative Name and Phone Number:       Current Level of Care: Hospital Recommended Level of Care: Skilled Nursing Facility Prior Approval Number:    Date Approved/Denied:   PASRR Number:  [id number])  Discharge Plan: SNF    Current Diagnoses: Patient Active Problem List   Diagnosis Date Noted  . Acute respiratory failure (HCC) 03/05/2016  . Acute encephalopathy 02/06/2016  . Acute cerebrovascular accident (CVA) (HCC)   . Altered mental status   . 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 Cellulitis of left lower extremity 11/19/2014  . COPD (chronic obstructive pulmonary disease) (HCC) 11/19/2014  . HTN (hypertension) 11/19/2014  . HLD (hyperlipidemia) 11/19/2014  . Diabetes mellitus (HCC) 05/21/2014  . Chronic anemia 05/21/2014    Orientation RESPIRATION BLADDER Height & Weight     Self  Room Air  Incontinent, Indwelling catheter Weight: 236 lb 8.9 oz (107.3 kg) Height:  5\' 7"  (170.2 cm)  BEHAVIORAL SYMPTOMS/MOOD NEUROLOGICAL BOWEL NUTRITION STATUS   (none)   Incontinent Diet: Carb Modified   AMBULATORY STATUS COMMUNICATION OF NEEDS Skin   Extensive Assist Verbally Normal                       Personal Care Assistance Level of Assistance  Bathing, Feeding, Dressing Bathing Assistance: Limited assistance Feeding assistance: Independent Dressing Assistance: Limited assistance     Functional Limitations Info  Sight, Hearing, Speech Sight Info: Adequate Hearing Info: Adequate Speech Info: Adequate    SPECIAL CARE FACTORS FREQUENCY  PT (By licensed  PT), OT (By licensed OT)     PT Frequency:  (5) OT Frequency:  (5)            Contractures      Additional Factors Info  Code Status, Allergies, Insulin Sliding Scale, Isolation Precautions Code Status Info:  (Full Code. ) Allergies Info:  (No Known Allergies. )   Insulin Sliding Scale Info:  (NovoLog Insulin Injections ) Isolation Precautions Info:  (History of MRSA )     Current Medications (03/06/2016):  This is the current hospital active medication list Current Facility-Administered Medications  Medication Dose Route Frequency Provider Last Rate Last Dose  . 0.9 %  sodium chloride infusion  250 mL Intravenous PRN Eugenie Norrieana G Blakeney, NP      . 0.9 %  sodium chloride infusion   Intravenous Continuous Bincy S Varughese, NP 125 mL/hr at 03/06/16 1100    . acetaminophen (TYLENOL) suppository 650 mg  650 mg Rectal Q4H PRN Gwendolyn FillBincy S Varughese, NP   650 mg at 03/06/16 0008  . acetaminophen (TYLENOL) tablet 650 mg  650 mg Oral Q4H PRN Eugenie Norrieana G Blakeney, NP      . chlorhexidine (PERIDEX) 0.12 % solution 15 mL  15 mL Mouth Rinse BID Bincy Baldemar FridayS Varughese, NP      . Chlorhexidine Gluconate Cloth 2 % PADS 6 each  6 each Topical Q0600 Shane CrutchPradeep Ramachandran, MD   6 each at 03/05/16 1547  . insulin aspart (novoLOG) injection 0-20 Units  0-20 Units Subcutaneous Q4H Eugenie Norrieana G Blakeney, NP   4  Units at 03/06/16 0846  . ipratropium-albuterol (DUONEB) 0.5-2.5 (3) MG/3ML nebulizer solution 3 mL  3 mL Nebulization Q4H PRN Eugenie Norrieana G Blakeney, NP      . MEDLINE mouth rinse  15 mL Mouth Rinse q12n4p Bincy S Varughese, NP      . mupirocin ointment (BACTROBAN) 2 % 1 application  1 application Nasal BID Shane CrutchPradeep Ramachandran, MD      . ondansetron (ZOFRAN) injection 4 mg  4 mg Intravenous Q6H PRN Eugenie Norrieana G Blakeney, NP      . piperacillin-tazobactam (ZOSYN) IVPB 3.375 g  3.375 g Intravenous Q8H Sheema M Hallaji, RPH   3.375 g at 03/06/16 0612     Discharge Medications: Please see discharge summary for a list of discharge  medications.  Relevant Imaging Results:  Relevant Lab Results:   Additional Information  (SSN: [id number])  Khriz Liddy, Darleen CrockerBailey M, LCSW

## 2016-03-06 NOTE — Progress Notes (Signed)
PHARMACY - PHYSICIAN COMMUNICATION CRITICAL VALUE ALERT - BLOOD CULTURE IDENTIFICATION (BCID)  Results for orders placed or performed during the hospital encounter of 03/05/16  Blood Culture ID Panel (Reflexed) (Collected: 03/05/2016  2:50 PM)  Result Value Ref Range   Enterococcus species NOT DETECTED NOT DETECTED   Listeria monocytogenes NOT DETECTED NOT DETECTED   Staphylococcus species DETECTED (A) NOT DETECTED   Staphylococcus aureus NOT DETECTED NOT DETECTED   Methicillin resistance DETECTED (A) NOT DETECTED   Streptococcus species NOT DETECTED NOT DETECTED   Streptococcus agalactiae NOT DETECTED NOT DETECTED   Streptococcus pneumoniae NOT DETECTED NOT DETECTED   Streptococcus pyogenes NOT DETECTED NOT DETECTED   Acinetobacter baumannii NOT DETECTED NOT DETECTED   Enterobacteriaceae species NOT DETECTED NOT DETECTED   Enterobacter cloacae complex NOT DETECTED NOT DETECTED   Escherichia coli NOT DETECTED NOT DETECTED   Klebsiella oxytoca NOT DETECTED NOT DETECTED   Klebsiella pneumoniae NOT DETECTED NOT DETECTED   Proteus species NOT DETECTED NOT DETECTED   Serratia marcescens NOT DETECTED NOT DETECTED   Haemophilus influenzae NOT DETECTED NOT DETECTED   Neisseria meningitidis NOT DETECTED NOT DETECTED   Pseudomonas aeruginosa NOT DETECTED NOT DETECTED   Candida albicans NOT DETECTED NOT DETECTED   Candida glabrata NOT DETECTED NOT DETECTED   Candida krusei NOT DETECTED NOT DETECTED   Candida parapsilosis NOT DETECTED NOT DETECTED   Candida tropicalis NOT DETECTED NOT DETECTED    Name of physician (or Provider) ContactedNicholos Johns: Ramachandran  Changes to prescribed antibiotics required: Continue Zosyn (no change)   Farrah Skoda D 03/06/2016  2:09 PM

## 2016-03-06 NOTE — Progress Notes (Signed)
PULMONARY / CRITICAL CARE MEDICINE   Name: Molly HughsBrenda P Manahan MRN: [id number] DOB: February 02, 1946    ADMISSION DATE:  03/05/2016 CONSULTATION DATE:  03/05/16  REFERRING MD:  Dr. Shaune PollackLord  CHIEF COMPLAINT:  Festus AloeUnresponsice  HISTORY OF PRESENT ILLNESS:   This is a 70 yo female with a PMH of CVA, HTN, Hyperlipidemia, Hemorrhoids, Obesity,Type II DM, COPD, and Anxiety.  [facility]   According to pts husband she was difficult to arouse this morning, therefore EMS was notified.  Per ER notes the pts husband stated the pts O2 sats were [facility] today and she was subsequently placed on O2 via nasal canula.  He states she had a vomiting episode yesterday and today, however she did not have any other complaints.  Upon arrival to the ER she was minimally responsive and hypotensive she was placed on Bipap and given a total of 4L NS boluses for fluid resuscitation, however she remained hypotensive requiring levophed gtt. PCCM contacted to admit pt to ICU due to acute hypoxic hypercapnic respiratory failure requiring Bipap, hypotension secondary to septic shock unclear etiology, and acute encephalopathy secondary to sepsis.   PAST MEDICAL HISTORY :  She  has a past medical history of Anxiety; COPD (chronic obstructive pulmonary disease) (HCC); Diabetes (HCC); Hemorrhoid; HLD (hyperlipidemia); HTN (hypertension); and Stroke (HCC) (2008).  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  No Known Allergies  REVIEW OF SYSTEMS:   Unable to assess pt on Bipap  SUBJECTIVE:  Pt currently on Bipap and following commands no episodes overnight.   VITAL SIGNS: BP (!) 120/57   Pulse 89   Temp 99.5 F (37.5 C)   Resp 16   Ht 5\' 7"  (1.702 m)   Wt 236 lb 8.9 oz (107.3 kg)   SpO2 100%   BMI 37.05 kg/m   HEMODYNAMICS:    VENTILATOR SETTINGS: FiO2  (%):  [40 %] 40 %  INTAKE / OUTPUT: I/O last 3 completed shifts: In: 1357.9 [I.V.:1257.9; IV Piggyback:100] Out: 815 [Urine:765; Stool:50]  PHYSICAL EXAMINATION: General:  Acutely ill obese Caucasian female Neuro:  Lethargic follows simple commands, PERRL HEENT:  Supple, no JVD Cardiovascular:  NSR, s1s2, rrr, no M/R/G Lungs: diminished throughout, even, non labored on Bipap Abdomen:  +BS x4, soft, obese, non tender, non distended  Musculoskeletal:  Normal bulk and tone  Skin: Unstagable pressure ulcer left foot dressing dry and intact  STUDIES:  None   CULTURES: Blood x2 12/7>>negative Urine 12/7>>negative Cdiff 12/7>>negatice Influenza PCR 12/8>>negative   ANTIBIOTICS: Vancomycin 12/7>>x1 dose Zosyn 12/7>>  SIGNIFICANT EVENTS: 12/7-[facility] ICU due to acute hypoxic hypercapnic respiratory failure requiring Bipap, acute encephalopathy secondary to sepsis, hypotension secondary to sepsis of unclear etiology  LINES/TUBES: PIV's x2>>  ASSESSMENT / PLAN:  PULMONARY A: Acute hypoxic hypercapnic respiratory failure  Hx: COPD  P:   Supplemental O2 as needed Maintain O2 sats 88% to 92% Prn bronchodilators Prn ABG's and CXR   CARDIOVASCULAR A:  Hypotension secondary to septic shock-resolved  Hx: HTN, CVA, and Hyperlipidemia P:  Hold outpatient aspirin, atorvastatin, plavix, and losartan until able to tolerate po meds  Continuous telemetry monitoring   RENAL A:   Acute renal failure-improving Hyperkalemia-resolved P:   Trend BMP's Replace electrolytes as indicated Repeat BMP today Monitor UOP  GASTROINTESTINAL A:   Nausea Vomiting 1 episode Melena-resolved Diarrhea  Hx:  Hemorrhoids   P:   Keep NPO for now  Prn Zofran for nausea and vomiting  IV Protonix for PUD prophylaxis   HEMATOLOGIC A:   Vaginal bleeding-resolved P:  Trend CBC's  Avoid chemical VTE prophylaxis  SCD's for VTE prophylaxis Monitor for s/sx of bleeding  If pt  continues to have vaginal bleeding will consult OBGYN  INFECTIOUS A:   Leukocytosis Septic shock secondary to unknown etiology  P:   Trend WBC's and monitor fever curve Trend PCT's and lactic acid Continue abx as listed above Follow cultures  Pressure Ulcer left foot will obtain xray today 12/8   ENDOCRINE A:   Hyperglycemia  P:   SSI CBG's q4hrs  NEUROLOGIC A:   Acute encephalopathy secondary to sepsis  P:   Avoid sedating medications  Frequent reorientation Lights on during the day    FAMILY  - Updates: Pts husband updated about plan of care and questions answered 03/06/2016  - Inter-disciplinary family meet or Palliative Care meeting due by:  03/12/2016   Sonda Rumbleana Blakeney, AGNP  Pulmonary/Critical Care Pager [phone] (please enter 7 digits) PCCM Consult Pager [phone] (please enter 7 digits)  Pt seen and examined with NP, agree with findings, assessment, plan as amended. Pt appears to have hypotension with septic shock, source is uncertain but may include silent aspiration pneumonia vs LLE decubitus ulcer. Lungs CTA. Continue current abx, wean down oxygen.  Await cultures.   Wells Guiles-Deep Aryelle Figg, M.D.  03/06/2016  Critical Care Attestation.  I have personally obtained a history, examined the patient, evaluated laboratory and imaging results, formulated the assessment and plan and placed orders. The Patient requires high complexity decision making for assessment and support, frequent evaluation and titration of therapies, application of advanced monitoring technologies and extensive interpretation of multiple databases. The patient has critical illness that could lead imminently to failure of 1 or more organ systems and requires the highest level of physician preparedness to intervene.  Critical Care Time devoted to patient care services described in this note is 45 minutes and is exclusive of time spent in procedures.

## 2016-03-07 ENCOUNTER — Inpatient Hospital Stay: Payer: Medicare HMO

## 2016-03-07 DIAGNOSIS — R7881 Bacteremia: Secondary | ICD-10-CM

## 2016-03-07 DIAGNOSIS — N179 Acute kidney failure, unspecified: Secondary | ICD-10-CM

## 2016-03-07 LAB — GLUCOSE, CAPILLARY
GLUCOSE-CAPILLARY: 102 mg/dL — AB (ref 65–99)
GLUCOSE-CAPILLARY: 104 mg/dL — AB (ref 65–99)
GLUCOSE-CAPILLARY: 133 mg/dL — AB (ref 65–99)
GLUCOSE-CAPILLARY: 263 mg/dL — AB (ref 65–99)
GLUCOSE-CAPILLARY: 193 mg/dL — AB (ref 65–99)
Glucose-Capillary: 127 mg/dL — ABNORMAL HIGH (ref 65–99)
Glucose-Capillary: 164 mg/dL — ABNORMAL HIGH (ref 65–99)

## 2016-03-07 LAB — CBC
HEMATOCRIT: 34.8 % — AB (ref 35.0–47.0)
HEMOGLOBIN: 11.3 g/dL — AB (ref 12.0–16.0)
MCH: 30.6 pg (ref 26.0–34.0)
MCHC: 32.6 g/dL (ref 32.0–36.0)
MCV: 93.9 fL (ref 80.0–100.0)
PLATELETS: 230 10*3/uL (ref 150–440)
RBC: 3.71 MIL/uL — AB (ref 3.80–5.20)
RDW: 14.3 % (ref 11.5–14.5)
WBC: 18.8 10*3/uL — ABNORMAL HIGH (ref 3.6–11.0)

## 2016-03-07 LAB — BASIC METABOLIC PANEL
Anion gap: 4 — ABNORMAL LOW (ref 5–15)
BUN: 19 mg/dL (ref 6–20)
CHLORIDE: 108 mmol/L (ref 101–111)
CO2: 28 mmol/L (ref 22–32)
Calcium: 7.8 mg/dL — ABNORMAL LOW (ref 8.9–10.3)
Creatinine, Ser: 0.88 mg/dL (ref 0.44–1.00)
GFR calc Af Amer: >60 mL/min (ref >60)
GFR calc non Af Amer: >60 mL/min (ref >60)
GLUCOSE: 116 mg/dL — AB (ref 65–99)
POTASSIUM: 4.2 mmol/L (ref 3.5–5.1)
Sodium: 140 mmol/L (ref 135–145)

## 2016-03-07 LAB — MAGNESIUM: Magnesium: 2 mg/dL (ref 1.7–2.4)

## 2016-03-07 LAB — PROCALCITONIN: Procalcitonin: 4.27 ng/mL

## 2016-03-07 MED ORDER — DIAZEPAM 5 MG PO TABS
2.5000 mg | ORAL_TABLET | ORAL | Status: DC
  Administered 2016-03-09 – 2016-03-10 (×3): 2.5 mg via ORAL
  Filled 2016-03-07 (×3): qty 1

## 2016-03-07 MED ORDER — LOSARTAN POTASSIUM 50 MG PO TABS
50.0000 mg | ORAL_TABLET | Freq: Every day | ORAL | Status: DC
  Administered 2016-03-07 – 2016-03-10 (×4): 50 mg via ORAL
  Filled 2016-03-07 (×5): qty 1

## 2016-03-07 MED ORDER — ATORVASTATIN CALCIUM 20 MG PO TABS
40.0000 mg | ORAL_TABLET | Freq: Every day | ORAL | Status: DC
  Administered 2016-03-08 – 2016-03-11 (×4): 40 mg via ORAL
  Filled 2016-03-07 (×4): qty 2

## 2016-03-07 MED ORDER — BUPROPION HCL ER (XL) 300 MG PO TB24
300.0000 mg | ORAL_TABLET | Freq: Every day | ORAL | Status: DC
  Administered 2016-03-07 – 2016-03-11 (×5): 300 mg via ORAL
  Filled 2016-03-07 (×5): qty 1

## 2016-03-07 MED ORDER — ASPIRIN EC 81 MG PO TBEC
81.0000 mg | DELAYED_RELEASE_TABLET | Freq: Every day | ORAL | Status: DC
  Administered 2016-03-07 – 2016-03-12 (×5): 81 mg via ORAL
  Filled 2016-03-07 (×6): qty 1

## 2016-03-07 MED ORDER — CLOPIDOGREL BISULFATE 75 MG PO TABS
75.0000 mg | ORAL_TABLET | Freq: Every day | ORAL | Status: DC
  Administered 2016-03-07 – 2016-03-12 (×6): 75 mg via ORAL
  Filled 2016-03-07 (×6): qty 1

## 2016-03-07 MED ORDER — AMITRIPTYLINE HCL 50 MG PO TABS
50.0000 mg | ORAL_TABLET | Freq: Every day | ORAL | Status: DC
  Administered 2016-03-07 – 2016-03-09 (×3): 50 mg via ORAL
  Filled 2016-03-07: qty 2
  Filled 2016-03-07 (×2): qty 1

## 2016-03-07 MED ORDER — BISACODYL 5 MG PO TBEC: 5.0000 mg | DELAYED_RELEASE_TABLET | Freq: Every day | ORAL | Status: DC | PRN

## 2016-03-07 MED ORDER — MECLIZINE HCL 25 MG PO TABS
25.0000 mg | ORAL_TABLET | Freq: Three times a day (TID) | ORAL | Status: DC
  Administered 2016-03-08 – 2016-03-10 (×6): 25 mg via ORAL
  Filled 2016-03-07 (×8): qty 1
  Filled 2016-03-07: qty 3
  Filled 2016-03-07 (×3): qty 1

## 2016-03-07 MED ORDER — INFLUENZA VAC SPLIT QUAD 0.5 ML IM SUSY
0.5000 mL | PREFILLED_SYRINGE | INTRAMUSCULAR | Status: AC
  Administered 2016-03-09: 0.5 mL via INTRAMUSCULAR
  Filled 2016-03-07 (×2): qty 0.5

## 2016-03-07 MED ORDER — SENNA 8.6 MG PO TABS
1.0000 | ORAL_TABLET | Freq: Two times a day (BID) | ORAL | Status: DC
  Administered 2016-03-08 – 2016-03-10 (×5): 8.6 mg via ORAL
  Filled 2016-03-07 (×5): qty 1

## 2016-03-07 MED ORDER — POLYETHYLENE GLYCOL 3350 17 G PO PACK
17.0000 g | PACK | Freq: Every day | ORAL | Status: DC
  Administered 2016-03-09: 17 g via ORAL
  Filled 2016-03-07 (×2): qty 1

## 2016-03-07 MED ORDER — INSULIN ASPART 100 UNIT/ML ~~LOC~~ SOLN
0.0000 [IU] | Freq: Three times a day (TID) | SUBCUTANEOUS | Status: DC
  Administered 2016-03-08: 3 [IU] via SUBCUTANEOUS
  Administered 2016-03-08: 4 [IU] via SUBCUTANEOUS
  Administered 2016-03-08: 3 [IU] via SUBCUTANEOUS
  Administered 2016-03-08: 4 [IU] via SUBCUTANEOUS
  Administered 2016-03-09: 3 [IU] via SUBCUTANEOUS
  Administered 2016-03-09: 4 [IU] via SUBCUTANEOUS
  Administered 2016-03-09: 7 [IU] via SUBCUTANEOUS
  Administered 2016-03-10 – 2016-03-11 (×4): 4 [IU] via SUBCUTANEOUS
  Administered 2016-03-11: 7 [IU] via SUBCUTANEOUS
  Administered 2016-03-11 (×2): 4 [IU] via SUBCUTANEOUS
  Administered 2016-03-12: 7 [IU] via SUBCUTANEOUS
  Administered 2016-03-12: 11 [IU] via SUBCUTANEOUS
  Filled 2016-03-07 (×3): qty 4
  Filled 2016-03-07: qty 3
  Filled 2016-03-07 (×2): qty 4
  Filled 2016-03-07: qty 11
  Filled 2016-03-07: qty 4
  Filled 2016-03-07 (×2): qty 7
  Filled 2016-03-07: qty 3
  Filled 2016-03-07 (×3): qty 4
  Filled 2016-03-07: qty 7

## 2016-03-07 MED ORDER — VANCOMYCIN HCL IN DEXTROSE 750-5 MG/150ML-% IV SOLN
750.0000 mg | Freq: Two times a day (BID) | INTRAVENOUS | Status: DC
  Administered 2016-03-07 – 2016-03-09 (×4): 750 mg via INTRAVENOUS
  Filled 2016-03-07 (×6): qty 150

## 2016-03-07 MED ORDER — PANTOPRAZOLE SODIUM 40 MG PO TBEC
40.0000 mg | DELAYED_RELEASE_TABLET | Freq: Every day | ORAL | Status: DC
  Administered 2016-03-07 – 2016-03-12 (×6): 40 mg via ORAL
  Filled 2016-03-07 (×6): qty 1

## 2016-03-07 MED ORDER — VANCOMYCIN HCL IN DEXTROSE 1-5 GM/200ML-% IV SOLN
1000.0000 mg | Freq: Once | INTRAVENOUS | Status: AC
  Administered 2016-03-07: 1000 mg via INTRAVENOUS
  Filled 2016-03-07: qty 200

## 2016-03-07 MED ORDER — ACETAMINOPHEN 325 MG PO TABS: 650.0000 mg | ORAL_TABLET | Freq: Four times a day (QID) | ORAL | Status: DC | PRN

## 2016-03-07 NOTE — Progress Notes (Signed)
Pharmacy Antibiotic Note  Molly [name] is a 70 y.o. female admitted on 03/05/2016 with pneumonia and sepsis.  Pharmacy has been consulted for vancomycin and piperacillin/tazobactam dosing.  Plan: Continue piperacillin/tazobactam 3.375 g IV q8h EI  MD would like to restart vancomycin based on BCID. Patient received one dose in the ED on 12/7.   Will give vancomycin 1000 mg dose once now followed by vancomycin 750 mg IV q12h (6 hour stacked dosing) Goal vancomycin trough 15-20 mcg/mL Vancomycin trough ordered on 12/11 at 1730 Patient is at risk for accumulation due to obesity  Kinetics: Adjusted body weight = 78 kg Ke: 0.066 Half-life: 10.5 hrs Vd: 54 L  Height: 5\' 7"  (170.2 cm) Weight: 231 lb 0.7 oz (104.8 kg) IBW/kg (Calculated) : 61.6  Temp (24hrs), Avg:99.4 F (37.4 C), Min:98.8 F (37.1 C), Max:99.9 F (37.7 C)   Recent Labs Lab 03/05/16 1412 03/05/16 1855 03/05/16 2149 03/06/16 0100 03/07/16 0510  WBC 29.9*  --   --  27.7* 18.8*  CREATININE 2.10* 1.83*  --  1.37* 0.88  LATICACIDVEN 2.1* 2.4* 2.6* 1.4  --     Estimated Creatinine Clearance: 74.1 mL/min (by C-G formula based on SCr of 0.88 mg/dL).    No Known Allergies  Antimicrobials this admission: Piperacillin/tazobactam 12/7 >>  vancomycin 12/9 >>   Dose adjustments this admission:  Microbiology results: 12/7 BCx: CoNS x 2 bottles, other set no growth to date 12/7 UCx: negative  12/7 MRSA PCR: Positive  Thank you for allowing pharmacy to be a part of this patient's care.  Cindi CarbonMary M Rhoda Waldvogel, PharmD, BCPS Clinical Pharmacist 03/07/2016 11:15 AM

## 2016-03-07 NOTE — Progress Notes (Signed)
Pt. Maintained on 3L [place] until QHS and was placed on Bipap at 2300-tolerating that fine.  Pt. VSS, UOP appropriate.   Flexi seal removed because stool was not loose enough and continued to clog. Pt. Had 3 episodes of soft stools overnight. Report given to Cindie LarocheSarah H.

## 2016-03-07 NOTE — Progress Notes (Signed)
PULMONARY / CRITICAL CARE MEDICINE   Name: Molly HughsBrenda P Pascua MRN: [id number] DOB: [birth year]    ADMISSION DATE:  03/05/2016 CONSULTATION DATE:  03/05/16  REFERRING MD:  Dr. Shaune PollackLord  CHIEF COMPLAINT:  Festus AloeUnresponsice  HISTORY OF PRESENT ILLNESS:   This is a 70 yo female with a PMH of CVA, HTN, Hyperlipidemia, Hemorrhoids, Obesity,Type II DM, COPD, and Anxiety.  [facility].   According to pts husband she was difficult to arouse this morning, therefore EMS was notified.  Per ER notes the pts husband stated the p[facility] and she was subsequently placed on O2 via nasal canula.  He states she had a vomiting episode yesterday and today, however she did not have any other complaints.  Upon arrival to the ER she was minimally responsive and hypotensive she was placed on Bipap and given a total of 4L NS boluses for fluid resuscitation, however she remained hypotensive requiring levophed gtt. PCCM contacted to admit pt to ICU due to acute hypoxic hypercapnic respiratory failure requiring Bipap, hypotension secondary to septic shock unclear etiology, and acute encephalopathy secondary to sepsis.   SUBJECTIVE: Now off levophed, weaned of bipap this morning. Awake and alert.   PAST MEDICAL HISTORY :  She  has a past medical history of Anxiety; COPD (chronic obstructive pulmonary disease) (HCC); Diabetes (HCC); Hemorrhoid; HLD (hyperlipidemia); HTN (hypertension); and Stroke (HCC) (2008).  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  No Known Allergies  REVIEW OF SYSTEMS:   HEENT - no HA CVS - NO CP LUNGS - no sob ABD - no pain MSK - no jt pain  VITAL SIGNS: BP (!) 145/68   Pulse 87   Temp 99 F (37.2 C)   Resp 14   Ht 5\' 7"  (1.702 m)   Wt 231 lb 0.7 oz (104.8 kg)   SpO2 100%   BMI 36.19 kg/m    HEMODYNAMICS:    VENTILATOR SETTINGS: FiO2 (%):  [40 %] 40 %  INTAKE / OUTPUT: I/O last 3 completed shifts: In: 4382.9 [I.V.:4132.9; IV Piggyback:250] Out: 2680 [Urine:2630; Stool:50]  PHYSICAL EXAMINATION: General:  Acutely ill obese Caucasian female Neuro:  Lethargic follows simple commands, PERRL HEENT:  Supple, no JVD Cardiovascular:  NSR, s1s2, rrr, no M/R/G Lungs: diminished throughout, but improved, weaned to [place] Abdomen:  +BS x4, soft, obese, non tender, non distended  Musculoskeletal:  Normal bulk and tone  Skin: Unstagable pressure ulcer left foot dressing dry and intact  STUDIES:  None   CULTURES: Blood x2 12/7>>stap Urine 12/7>>negative Cdiff 12/7>>negatice Influenza PCR 12/8>>negative   ANTIBIOTICS: Vancomycin 12/7>> Zosyn 12/7>>   SIGNIFICANT EVENTS: 12/7-[facility] due to acute hypoxic hypercapnic respiratory failure requiring Bipap, acute encephalopathy secondary to sepsis, hypotension secondary to sepsis of unclear etiology  LINES/TUBES: PIV's x2>>  ASSESSMENT / PLAN:  PULMONARY A: Acute hypoxic hypercapnic respiratory failure  Hx: COPD  P:   Supplemental O2 as needed Maintain O2 sats 88% to 92% Prn bronchodilators Prn ABG's and CXR  Incentive spirometry  CARDIOVASCULAR A:  Hypotension secondary to septic shock-resolved  Hx: HTN, CVA, and Hyperlipidemia P:  Hold outpatient aspirin, atorvastatin, plavix, and losartan until able to tolerate po meds  Continuous telemetry monitoring   RENAL A:   Acute renal failure-improving Hyperkalemia-resolved P:   Trend BMP's Replace electrolytes as indicated Repeat  BMP today Monitor UOP  GASTROINTESTINAL A:   Nausea Vomiting 1 episode Melena-resolved Diarrhea  Hx: Hemorrhoids   P:   Keep NPO for now  Prn Zofran for nausea and vomiting  IV Protonix for PUD prophylaxis   HEMATOLOGIC A:   Vaginal bleeding-resolved P:  Trend CBC's  Avoid chemical VTE prophylaxis   SCD's for VTE prophylaxis Monitor for s/sx of bleeding  If pt continues to have vaginal bleeding will consult OBGYN  INFECTIOUS A:   Leukocytosis Septic shock secondary to bacteremia - 2/2 staph in bld cx from 12/7 P:   Trend WBC's and monitor fever curve Trend PCT's and lactic acid Continue abx as listed above Follow cultures  Pressure Ulcer left foot xray 12/8 >no soft tissue swelling, no gas, normal post-op film Restart vanc Recheck bld cx  ENDOCRINE A:   Hyperglycemia  P:   SSI CBG's q4hrs  NEUROLOGIC A:   Acute encephalopathy secondary to sepsis  P:   Avoid sedating medications  Frequent reorientation Lights on during the day    I have personally obtained a history, examined the patient, evaluated laboratory and imaging results, formulated the assessment and plan and placed orders. CRITICAL CARE: The patient is critically ill with multiple organ systems failure and requires high complexity decision making for assessment and support, frequent evaluation and titration of therapies, application of advanced monitoring technologies and extensive interpretation of multiple databases. Critical Care Time devoted to patient care services described in this note is 45 minutes.    Stephanie AcreVishal Brenlyn Beshara, MD [place] [facility] Pager [phone]- [phone] (please enter 7-digits) On Call Pager - [phone] (please enter 7-digits)

## 2016-03-07 NOTE — Progress Notes (Signed)
Patient has refused bipap for the night. 

## 2016-03-07 NOTE — Progress Notes (Addendum)
Per Dr. Dema SeverinMungal, foley catheter may be removed and pt may have a diet if she tolerated ice chips and sips well. Provided pt with ice chips and a few sips of water. She was able to swallow all without difficulty. Carb modified with soft food diet ordered as pt and husband also requested she be given softened food. Foley catheter removed at 1115. Pt DTV at 1715.   1640: Pt has voided since foley was removed.

## 2016-03-08 ENCOUNTER — Inpatient Hospital Stay: Payer: Medicare HMO

## 2016-03-08 LAB — GLUCOSE, CAPILLARY
GLUCOSE-CAPILLARY: 169 mg/dL — AB (ref 65–99)
Glucose-Capillary: 161 mg/dL — ABNORMAL HIGH (ref 65–99)
Glucose-Capillary: 150 mg/dL — ABNORMAL HIGH (ref 65–99)
Glucose-Capillary: 146 mg/dL — ABNORMAL HIGH (ref 65–99)

## 2016-03-08 LAB — CULTURE, BLOOD (ROUTINE X 2)

## 2016-03-08 NOTE — Progress Notes (Signed)
Pt has remained alert to self only with no c/o pain. Lung sounds clear in upper lobes, coarse crackles in lower posterior lobes. RR even and unlabored. 2LNC while sleeping, SpO2 100%. SpO2 <90% on RA. NSR with a BBB, 1st degree heart block. BP WNL. Pt with orders to tx to floor.

## 2016-03-08 NOTE — Progress Notes (Signed)
No acute events overnight. BP and HR stable. Mental status at baseline. SPO2 in the high 90s on room air. Patient is medically stable for transfer out of the unit. Report given to Dr. Tobi BastosPyreddy at 5:15am. Hospitalist will resume patient's care starting 03/08/16.   Merril Isakson S. [facility] ANP-BC Pulmonary and Critical Care Medicine [facility] Pager [phone] or [phone]

## 2016-03-08 NOTE — Plan of Care (Signed)
Problem: Skin Integrity: Goal: Risk for impaired skin integrity will decrease Outcome: Progressing Pt turned q2h.  Meticulous skin care provided for each episode of bowel and bladder incontinence.  Pink foam sacral pad changed.  Forehead pulse ox location changed to R side of forehead. L foot area of redness and swelling unchanged.  Skin remains intact.

## 2016-03-08 NOTE — Progress Notes (Signed)
Patient ID: Molly HughsBrenda P Connaughton, female   DOB: May 10, 1945, 70 y.o.   MRN: [id number]  [facility] PROGRESS NOTE  Molly [name] DOB: May 10, 1945 DOA: 03/05/2016 PCP: Elita Booneoberts, Caroline C, MD  HPI/Subjective: Patient signed out by the critical care service. Patient feels fine and offers no complaints. Patient  Objective: Vitals:   03/08/16 1101 03/08/16 1442  BP: 137/68 (!) 144/74  Pulse: 87 96  Resp: 18 18  Temp: 98.2 F (36.8 C) 98.3 F (36.8 C)    Filed Weights   03/06/16 0416 03/07/16 0441 03/08/16 0420  Weight: 107.3 kg (236 lb 8.9 oz) 104.8 kg (231 lb 0.7 oz) 107.1 kg (236 lb 1.8 oz)    ROS: Review of Systems  Unable to perform ROS: Acuity of condition  Respiratory: Negative for cough and shortness of breath.   Cardiovascular: Negative for chest pain.  Gastrointestinal: Negative for abdominal pain, constipation, diarrhea, nausea and vomiting.   Exam: Physical Exam  HENT:  Nose: No mucosal edema.  Mouth/Throat: No oropharyngeal exudate or posterior oropharyngeal edema.  Eyes: Conjunctivae, EOM and lids are normal. Pupils are equal, round, and reactive to light.  Neck: No JVD present. Carotid bruit is not present. No edema present. Thyroid mass present. No thyromegaly present.  Cardiovascular: S1 normal and S2 normal.  Exam reveals no gallop.   No murmur heard. Pulses:      Dorsalis pedis pulses are 2+ on the right side, and 2+ on the left side.  Respiratory: No respiratory distress. She has no wheezes. She has no rhonchi. She has no rales.  GI: Soft. Bowel sounds are normal. There is no tenderness.  Musculoskeletal:       Right ankle: She exhibits swelling.       Left ankle: She exhibits swelling.  Lymphadenopathy:    She has no cervical adenopathy.  Neurological: She is alert.  Skin: Skin is warm. Nails show no clubbing.  Chronic upper extremity discoloration  Psychiatric: She has a normal mood and affect.      Data Reviewed: Basic  Metabolic Panel:  Recent Labs Lab 03/05/16 1412 03/05/16 1855 03/06/16 0100 03/07/16 0510  NA 134* 136 136 140  K 5.3* 5.1 4.6 4.2  CL 97* 103 106 108  CO2 26 25 26 28   GLUCOSE 342* 303* 241* 116*  BUN 42* 35* 36* 19  CREATININE 2.10* 1.83* 1.37* 0.88  CALCIUM 9.3 8.0* 7.8* 7.8*  MG  --   --  3.2* 2.0  PHOS  --   --  3.3  --    Liver Function Tests:  Recent Labs Lab 03/05/16 1412  AST 27  ALT 34  ALKPHOS 112  BILITOT 1.2  PROT 6.9  ALBUMIN 3.0*   CBC:  Recent Labs Lab 03/05/16 1412 03/06/16 0100 03/07/16 0510  WBC 29.9* 27.7* 18.8*  HGB 14.8 12.3 11.3*  HCT 45.6 37.5 34.8*  MCV 92.8 92.7 93.9  PLT 328 247 230    CBG:  Recent Labs Lab 03/07/16 1553 03/07/16 1942 03/07/16 2210 03/08/16 0723 03/08/16 1102  GLUCAP 263* 193* 164* 161* 169*    Recent Results (from the past 240 hour(s))  Culture, blood (Routine x 2)     Status: None (Preliminary result)   Collection Time: 03/05/16  2:12 PM  Result Value Ref Range Status   Specimen Description BLOOD RIGHT AC  Final   Special Requests   Final    BOTTLES DRAWN AEROBIC AND ANAEROBIC AER 7ML ANA 7ML   Culture NO GROWTH  3 DAYS  Final   Report Status PENDING  Incomplete  Urine culture     Status: None   Collection Time: 03/05/16  2:12 PM  Result Value Ref Range Status   Specimen Description URINE, RANDOM  Final   Special Requests NONE  Final   Culture NO GROWTH [facility]   Final   Report Status 03/06/2016 FINAL  Final  Culture, blood (Routine x 2)     Status: Abnormal   Collection Time: 03/05/16  2:50 PM  Result Value Ref Range Status   Specimen Description BLOOD LEFT AC  Final   Special Requests   Final    BOTTLES DRAWN AEROBIC AND ANAEROBIC AER 13ML ANA 12ML   Culture  Setup Time   Final    GRAM POSITIVE COCCI IN BOTH AEROBIC AND ANAEROBIC BOTTLES CRITICAL RESULT CALLED TO, READ BACK BY AND VERIFIED WITH: KAREN HAYES 03/06/16 1356 SGD    Culture (A)  Final     STAPHYLOCOCCUS SPECIES (COAGULASE NEGATIVE) THE SIGNIFICANCE OF ISOLATING THIS ORGANISM FROM A SINGLE SET OF BLOOD CULTURES WHEN MULTIPLE SETS ARE DRAWN IS UNCERTAIN. PLEASE NOTIFY THE MICROBIOLOGY DEPARTMENT WITHIN ONE WEEK IF SPECIATION AND SENSITIVITIES ARE REQUIRED. Performed [facility]    Report Status 03/08/2016 FINAL  Final  Blood Culture ID Panel (Reflexed)     Status: Abnormal   Collection Time: 03/05/16  2:50 PM  Result Value Ref Range Status   Enterococcus species NOT DETECTED NOT DETECTED Final   Listeria monocytogenes NOT DETECTED NOT DETECTED Final   Staphylococcus species DETECTED (A) NOT DETECTED Final    Comment: CRITICAL RESULT CALLED TO, READ BACK BY AND VERIFIED WITH: KAREN HAYES 03/06/16 1356 SGD    Staphylococcus aureus NOT DETECTED NOT DETECTED Final   Methicillin resistance DETECTED (A) NOT DETECTED Final    Comment: CRITICAL RESULT CALLED TO, READ BACK BY AND VERIFIED WITH: KAREN HAYES 03/06/16 1356 SGD    Streptococcus species NOT DETECTED NOT DETECTED Final   Streptococcus agalactiae NOT DETECTED NOT DETECTED Final   Streptococcus pneumoniae NOT DETECTED NOT DETECTED Final   Streptococcus pyogenes NOT DETECTED NOT DETECTED Final   Acinetobacter baumannii NOT DETECTED NOT DETECTED Final   Enterobacteriaceae species NOT DETECTED NOT DETECTED Final   Enterobacter cloacae complex NOT DETECTED NOT DETECTED Final   Escherichia coli NOT DETECTED NOT DETECTED Final   Klebsiella oxytoca NOT DETECTED NOT DETECTED Final   Klebsiella pneumoniae NOT DETECTED NOT DETECTED Final   Proteus species NOT DETECTED NOT DETECTED Final   Serratia marcescens NOT DETECTED NOT DETECTED Final   Haemophilus influenzae NOT DETECTED NOT DETECTED Final   Neisseria meningitidis NOT DETECTED NOT DETECTED Final   Pseudomonas aeruginosa NOT DETECTED NOT DETECTED Final   Candida albicans NOT DETECTED NOT DETECTED Final   Candida glabrata NOT DETECTED NOT DETECTED Final   Candida  krusei NOT DETECTED NOT DETECTED Final   Candida parapsilosis NOT DETECTED NOT DETECTED Final   Candida tropicalis NOT DETECTED NOT DETECTED Final  C difficile quick scan w PCR reflex     Status: None   Collection Time: 03/05/16  4:29 PM  Result Value Ref Range Status   C Diff antigen NEGATIVE NEGATIVE Final   C Diff toxin NEGATIVE NEGATIVE Final   C Diff interpretation No C. difficile detected.  Final  MRSA PCR Screening     Status: Abnormal   Collection Time: 03/05/16  5:42 PM  Result Value Ref Range Status   MRSA by PCR  POSITIVE (A) NEGATIVE Final    Comment:        The GeneXpert MRSA Assay (FDA approved for NASAL specimens only), is one component of a comprehensive MRSA colonization surveillance program. It is not intended to diagnose MRSA infection nor to guide or monitor treatment for MRSA infections. RESULT CALLED TO, READ BACK BY AND VERIFIED WITH: KELSEY BLACK 03/05/16 1915 KLW   Culture, blood (Routine X 2) w Reflex to ID Panel     Status: None (Preliminary result)   Collection Time: 03/07/16 11:22 AM  Result Value Ref Range Status   Specimen Description BLOOD RIGHT ANTECUBITAL  Final   Special Requests BOTTLES DRAWN AEROBIC AND ANAEROBIC  10CC  Final   Culture NO GROWTH < 24 HOURS  Final   Report Status PENDING  Incomplete  Culture, blood (Routine X 2) w Reflex to ID Panel     Status: None (Preliminary result)   Collection Time: 03/07/16 11:22 AM  Result Value Ref Range Status   Specimen Description BLOOD LEFT HAND  Final   Special Requests   Final    BOTTLES DRAWN AEROBIC AND ANAEROBIC  AER 5CC ANA 6CC   Culture NO GROWTH < 24 HOURS  Final   Report Status PENDING  Incomplete     Studies: Dg Chest Port 1 View  Result Date: 03/07/2016 CLINICAL DATA:  Acute respiratory failure EXAM: PORTABLE CHEST 1 VIEW COMPARISON:  03/06/2016 FINDINGS: Lungs remain clear without infiltrate or effusion. Decreased lung volume unchanged. Negative for heart failure or effusion.  IMPRESSION: No active disease. Electronically Signed   By: Marlan Palau M.D.   On: 03/07/2016 07:18    Scheduled Meds: . amitriptyline  50 mg Oral QHS  . aspirin EC  81 mg Oral Daily  . atorvastatin  40 mg Oral q1800  . buPROPion  300 mg Oral Daily  . clopidogrel  75 mg Oral Daily  . diazepam  2.5 mg Oral BH-q7a  . Influenza vac split quadrivalent PF  0.5 mL Intramuscular Tomorrow-1000  . insulin aspart  0-20 Units Subcutaneous TID AC & HS  . losartan  50 mg Oral Daily  . meclizine  25 mg Oral TID  . mupirocin ointment  1 application Nasal BID  . pantoprazole  40 mg Oral Daily  . piperacillin-tazobactam (ZOSYN)  IV  3.375 g Intravenous Q8H  . polyethylene glycol  17 g Oral Daily  . senna  1 tablet Oral BID  . vancomycin  750 mg Intravenous Q12H    Assessment/Plan:  1. Acute respiratory with hypoxia. Patient initially required BiPAP and was admitted to the critical care service. Patient's breathing comfortably at this point. 2. Fever, tachycardia and leukocytosis. Patient also had lactic acidosis and high pro-calcitonin. Unclear where the source of sepsis is. Repeated chest x-ray and see if there is a pneumonia. Continue aggressive antibiotics vancomycin and Zosyn for now. 3. Hypotension on presentation. This has resolved with IV fluid hydration 4. Dehydration and acute kidney injury. This has resolved with IV fluid hydration. 5. Thyroid mass. Was set up for an outpatient biopsy this Friday. Thyroid function tests normal. 6. History of stroke on aspirin and Plavix 7. Hyperlipidemia unspecified on atorvastatin 8. History of COPD 9. History of anxiety on Wellbutrin and Valium 10. Type 2 diabetes mellitus. On sliding scale for right now. May be able to add low-dose Lantus tonight.  Code Status:     Code Status Orders        Start     Ordered  03/05/16 1548  Full code  Continuous     03/05/16 1549    Code Status History    Date Active Date Inactive Code Status Order ID  Comments User Context   02/06/2016  3:32 PM 02/11/2016  7:25 PM Full Code [id number]  Wyatt Haste, MD ED   02/01/2016  9:17 PM 02/05/2016  1:59 PM Full Code [id number]  Auburn Bilberry, MD Inpatient   01/14/2016  2:01 PM 01/16/2016 10:22 PM Full Code [id number]  Katha Hamming, MD ED   11/19/2014 10:40 PM 11/21/2014  8:55 PM Full Code [id number]  Oralia Manis, MD Inpatient   05/22/2014  6:31 PM 05/25/2014  8:20 PM Full Code [id number]  Toni Arthurs, MD Inpatient   05/21/2014 10:31 PM 05/22/2014  6:31 PM Full Code [id number]  Yevonne Pax, MD Inpatient     Family Communication: Spoke with husband on the phone Disposition Plan: To be determined  Consultants:  Critical care specialist signed off  Antibiotics:  Vancomycin  Zosyn  Time spent: 28 minutes  Alford Highland  [employer]

## 2016-03-08 NOTE — Plan of Care (Signed)
Problem: Physical Regulation: Goal: Ability to maintain clinical measurements within normal limits will improve Outcome: Progressing All VS WNL except SaO2, but only when sleeping.  SaO2 decreasing down to the low to mid 80's, requiring 2L [place] to maintain SaO2 within parameters.

## 2016-03-09 LAB — GLUCOSE, CAPILLARY
GLUCOSE-CAPILLARY: 112 mg/dL — AB (ref 65–99)
GLUCOSE-CAPILLARY: 177 mg/dL — AB (ref 65–99)
GLUCOSE-CAPILLARY: 137 mg/dL — AB (ref 65–99)
GLUCOSE-CAPILLARY: 227 mg/dL — AB (ref 65–99)

## 2016-03-09 LAB — CBC
HCT: 33 % — ABNORMAL LOW (ref 35.0–47.0)
Hemoglobin: 11 g/dL — ABNORMAL LOW (ref 12.0–16.0)
MCH: 30.8 pg (ref 26.0–34.0)
MCHC: 33.3 g/dL (ref 32.0–36.0)
MCV: 92.5 fL (ref 80.0–100.0)
Platelets: 270 10*3/uL (ref 150–440)
RBC: 3.57 MIL/uL — AB (ref 3.80–5.20)
RDW: 14.2 % (ref 11.5–14.5)
WBC: 13.4 10*3/uL — AB (ref 3.6–11.0)

## 2016-03-09 LAB — CREATININE, SERUM
Creatinine, Ser: 0.72 mg/dL (ref 0.44–1.00)
GFR calc Af Amer: >60 mL/min (ref >60)
GFR calc non Af Amer: >60 mL/min (ref >60)

## 2016-03-09 MED ORDER — AMOXICILLIN-POT CLAVULANATE 875-125 MG PO TABS
1.0000 | ORAL_TABLET | Freq: Two times a day (BID) | ORAL | Status: DC
  Administered 2016-03-09 – 2016-03-12 (×6): 1 via ORAL
  Filled 2016-03-09 (×7): qty 1

## 2016-03-09 MED ORDER — INSULIN GLARGINE 100 UNIT/ML ~~LOC~~ SOLN
6.0000 [IU] | Freq: Every day | SUBCUTANEOUS | Status: DC
  Administered 2016-03-09: 6 [IU] via SUBCUTANEOUS
  Filled 2016-03-09 (×2): qty 0.06

## 2016-03-09 MED ORDER — DOXYCYCLINE HYCLATE 100 MG PO TABS
100.0000 mg | ORAL_TABLET | Freq: Two times a day (BID) | ORAL | Status: DC
  Administered 2016-03-09 – 2016-03-12 (×6): 100 mg via ORAL
  Filled 2016-03-09 (×6): qty 1

## 2016-03-09 NOTE — Progress Notes (Signed)
Overnight Pulse ox started. Alarms SpO2 100-85%, HR 170-40

## 2016-03-09 NOTE — Progress Notes (Signed)
PT Cancellation Note  Patient Details Name: Ardis HughsBrenda P Funes MRN: [id number] DOB: 1946-03-07   Cancelled Treatment:    Reason Eval/Treat Not Completed: Fatigue/lethargy limiting ability to participate (Consult received and chart reviewed.  Evaluation attempted.  Patient lethargic; arouses briefly with max cuing from therapist, but unable to maintain for participation with session.  Will re-attempt at later time/date as appropriate.)   Kingjames Coury H. Manson PasseyBrown, PT, DPT, NCS 03/09/16, 9:50 AM [phone]

## 2016-03-09 NOTE — Consult Note (Signed)
[facility] Infectious Disease     Reason for Consult: fever  Referring Physician: Karlton Lemon Date of Admission:  03/05/2016   Active Problems:   Acute respiratory failure ([place])   HPI: Molly [name] is a 70 y.o. female admitted with AMS from [facility] and with hypoxia. She had one episode of vomiting the day prior. On admission she was hpotensive and minimally responsive. Reviewed IV fluids and BIPAP and admitted to ICU.  On admit wbc was 29 and temp up to 101.7. CXR neg for infiltrate. Cr elevated to 1.37, LFTs nml.  UA negative, UCX neg. She had 1/2 BCX + Coag neg staph and fu bcx neg. C diff neg.  Started on vanco and zosyn and has been afebrile, wbc down to 13.  Out of unit.  Found to have a pressure ulcer L foot but xray negative.    PMH of CVA, HTN, Hyperlipidemia, Hemorrhoids, Obesity,Type II DM, COPD, and Anxiety  Past Medical History:  Diagnosis Date  . Anxiety   . COPD (chronic obstructive pulmonary disease) ([place])   . Diabetes ([place])   . Hemorrhoid   . HLD (hyperlipidemia)   . HTN (hypertension)   . Stroke [facility]) 2008   left weakness   Past Surgical History:  Procedure Laterality Date  . AMPUTATION Left 05/22/2014   Procedure: AMPUTATION RAY LEFT FOURTH AND FIFTH ;  Surgeon: Wylene Simmer, MD;  Location: [place];  Service: Orthopedics;  Laterality: Left;  . CATARACT EXTRACTION    . COLONOSCOPY  2004   Social History  Substance Use Topics  . Smoking status: Former Smoker    Packs/day: 0.50    Years: 2.00    Types: Cigarettes    Quit date: 11/18/1968  . Smokeless tobacco: Never Used  . Alcohol use No     Comment: seldom   Family History  Problem Relation Age of Onset  . Hyperlipidemia Mother   . Varicose Veins Mother   . Deep vein thrombosis Father   . Stroke Father   . Breast cancer Paternal Aunt   . Stroke Maternal Grandmother   . Stroke Paternal Grandmother     Allergies: No Known Allergies  Current antibiotics: Antibiotics Given (last 72 hours)     Date/Time Action Medication Dose Rate   03/06/16 2300 Given   piperacillin-tazobactam (ZOSYN) IVPB 3.375 g 3.375 g 12.5 mL/hr   03/07/16 0615 Given   piperacillin-tazobactam (ZOSYN) IVPB 3.375 g 3.375 g 12.5 mL/hr   03/07/16 1229 Given   vancomycin (VANCOCIN) IVPB 1000 mg/200 mL premix 1,000 mg 200 mL/hr   03/07/16 1338 Given   piperacillin-tazobactam (ZOSYN) IVPB 3.375 g 3.375 g 12.5 mL/hr   03/07/16 1715 Given   vancomycin (VANCOCIN) IVPB 750 mg/150 ml premix 750 mg 150 mL/hr   03/07/16 2125 Given   piperacillin-tazobactam (ZOSYN) IVPB 3.375 g 3.375 g 12.5 mL/hr   03/08/16 0503 Given   vancomycin (VANCOCIN) IVPB 750 mg/150 ml premix 750 mg 150 mL/hr   03/08/16 5277 Given   piperacillin-tazobactam (ZOSYN) IVPB 3.375 g 3.375 g 12.5 mL/hr   03/08/16 1438 Given   piperacillin-tazobactam (ZOSYN) IVPB 3.375 g 3.375 g 12.5 mL/hr   03/08/16 2002 Given   vancomycin (VANCOCIN) IVPB 750 mg/150 ml premix 750 mg 150 mL/hr   03/08/16 2140 Given   piperacillin-tazobactam (ZOSYN) IVPB 3.375 g 3.375 g 12.5 mL/hr   03/09/16 0532 Given   piperacillin-tazobactam (ZOSYN) IVPB 3.375 g 3.375 g 12.5 mL/hr   03/09/16 0733 Given   vancomycin (VANCOCIN)  IVPB 750 mg/150 ml premix 750 mg 150 mL/hr      MEDICATIONS: . amitriptyline  50 mg Oral QHS  . aspirin EC  81 mg Oral Daily  . atorvastatin  40 mg Oral q1800  . buPROPion  300 mg Oral Daily  . clopidogrel  75 mg Oral Daily  . diazepam  2.5 mg Oral BH-q7a  . Influenza vac split quadrivalent PF  0.5 mL Intramuscular Tomorrow-1000  . insulin aspart  0-20 Units Subcutaneous TID AC & HS  . losartan  50 mg Oral Daily  . meclizine  25 mg Oral TID  . mupirocin ointment  1 application Nasal BID  . pantoprazole  40 mg Oral Daily  . piperacillin-tazobactam (ZOSYN)  IV  3.375 g Intravenous Q8H  . polyethylene glycol  17 g Oral Daily  . senna  1 tablet Oral BID  . vancomycin  750 mg Intravenous Q12H    Review of Systems - unable to  obtain OBJECTIVE: Temp:  [98.3 F (36.8 C)-99 F (37.2 C)] 99 F (37.2 C) (12/11 0440) Pulse Rate:  [86-96] 86 (12/11 0732) Resp:  [18-20] 18 (12/11 0732) BP: (144-156)/(65-84) 150/84 (12/11 0732) SpO2:  [93 %-99 %] 94 % (12/11 0732) Weight:  [102 kg (224 lb 12.8 oz)] 102 kg (224 lb 12.8 oz) (12/11 0500) Physical Exam  Constitutional:  Morbidly obese, lying in bed, confused but unclear baseline HENT: [place]/AT, PERRLA, no scleral icterus Mouth/Throat: Oropharynx is clear and dry . No oropharyngeal exudate.  Cardiovascular: Normal rate, regular rhythm and distant heart sounds. Pulmonary/Chest: Effort normal and breath sounds normal.  Neck = supple, no nuchal rigidity Abdominal: Soft. Obese  Bowel sounds are normal.  exhibits no distension. There is no tenderness.  Lymphadenopathy: no cervical adenopathy. No axillary adenopathy Neurological: can tell me her name and dob. Minimally interactive.  Skin: Skin is warm and dry. L lateral foot with healed wound, no warmth, drainage or tenderness Psychiatric: flat affect  LABS: Results for orders placed or performed during the hospital encounter of 03/05/16 (from the past 48 hour(s))  Glucose, capillary     Status: Abnormal   Collection Time: 03/07/16  3:53 PM  Result Value Ref Range   Glucose-Capillary 263 (H) 65 - 99 mg/dL  Glucose, capillary     Status: Abnormal   Collection Time: 03/07/16  7:42 PM  Result Value Ref Range   Glucose-Capillary 193 (H) 65 - 99 mg/dL   Comment 1 Notify RN   Glucose, capillary     Status: Abnormal   Collection Time: 03/07/16 10:10 PM  Result Value Ref Range   Glucose-Capillary 164 (H) 65 - 99 mg/dL   Comment 1 Notify RN   Glucose, capillary     Status: Abnormal   Collection Time: 03/08/16  7:23 AM  Result Value Ref Range   Glucose-Capillary 161 (H) 65 - 99 mg/dL  Glucose, capillary     Status: Abnormal   Collection Time: 03/08/16 11:02 AM  Result Value Ref Range   Glucose-Capillary 169 (H) 65 - 99  mg/dL   Comment 1 Notify RN   Glucose, capillary     Status: Abnormal   Collection Time: 03/08/16  5:15 PM  Result Value Ref Range   Glucose-Capillary 150 (H) 65 - 99 mg/dL   Comment 1 Notify RN   Glucose, capillary     Status: Abnormal   Collection Time: 03/08/16  9:15 PM  Result Value Ref Range   Glucose-Capillary 146 (H) 65 - 99 mg/dL  Comment 1 Notify RN   Creatinine, serum     Status: None   Collection Time: 03/09/16  3:12 AM  Result Value Ref Range   Creatinine, Ser 0.72 0.44 - 1.00 mg/dL   GFR calc non Af Amer >60 >60 mL/min   GFR calc Af Amer >60 >60 mL/min    Comment: (NOTE) The eGFR has been calculated using the CKD EPI equation. This calculation has not been validated in all clinical situations. eGFR's persistently <60 mL/min signify possible Chronic Kidney Disease.   CBC     Status: Abnormal   Collection Time: 03/09/16  3:12 AM  Result Value Ref Range   WBC 13.4 (H) 3.6 - 11.0 K/uL   RBC 3.57 (L) 3.80 - 5.20 MIL/uL   Hemoglobin 11.0 (L) 12.0 - 16.0 g/dL   HCT 33.0 (L) 35.0 - 47.0 %   MCV 92.5 80.0 - 100.0 fL   MCH 30.8 26.0 - 34.0 pg   MCHC 33.3 32.0 - 36.0 g/dL   RDW 14.2 11.5 - 14.5 %   Platelets 270 150 - 440 K/uL  Glucose, capillary     Status: Abnormal   Collection Time: 03/09/16  7:35 AM  Result Value Ref Range   Glucose-Capillary 112 (H) 65 - 99 mg/dL  Glucose, capillary     Status: Abnormal   Collection Time: 03/09/16 11:24 AM  Result Value Ref Range   Glucose-Capillary 177 (H) 65 - 99 mg/dL   No components found for: ESR, C REACTIVE PROTEIN MICRO: Recent Results (from the past 720 hour(s))  Culture, blood (Routine x 2)     Status: None (Preliminary result)   Collection Time: 03/05/16  2:12 PM  Result Value Ref Range Status   Specimen Description BLOOD RIGHT AC  Final   Special Requests   Final    BOTTLES DRAWN AEROBIC AND ANAEROBIC AER 7ML ANA 7ML   Culture NO GROWTH 4 DAYS  Final   Report Status PENDING  Incomplete  Urine culture      Status: None   Collection Time: 03/05/16  2:12 PM  Result Value Ref Range Status   Specimen Description URINE, RANDOM  Final   Special Requests NONE  Final   Culture NO GROWTH Performed at [facility]   Final   Report Status 03/06/2016 FINAL  Final  Culture, blood (Routine x 2)     Status: Abnormal   Collection Time: 03/05/16  2:50 PM  Result Value Ref Range Status   Specimen Description BLOOD LEFT AC  Final   Special Requests   Final    BOTTLES DRAWN AEROBIC AND ANAEROBIC AER 13ML ANA 12ML   Culture  Setup Time   Final    GRAM POSITIVE COCCI IN BOTH AEROBIC AND ANAEROBIC BOTTLES CRITICAL RESULT CALLED TO, READ BACK BY AND VERIFIED WITH: KAREN HAYES 03/06/16 1356 SGD    Culture (A)  Final    STAPHYLOCOCCUS SPECIES (COAGULASE NEGATIVE) THE SIGNIFICANCE OF ISOLATING THIS ORGANISM FROM A SINGLE SET OF BLOOD CULTURES WHEN MULTIPLE SETS ARE DRAWN IS UNCERTAIN. PLEASE NOTIFY THE MICROBIOLOGY DEPARTMENT WITHIN ONE WEEK IF SPECIATION AND SENSITIVITIES ARE REQUIRED. Performed at [facility]    Report Status 03/08/2016 FINAL  Final  Blood Culture ID Panel (Reflexed)     Status: Abnormal   Collection Time: 03/05/16  2:50 PM  Result Value Ref Range Status   Enterococcus species NOT DETECTED NOT DETECTED Final   Listeria monocytogenes NOT DETECTED NOT DETECTED Final   Staphylococcus species DETECTED (A)  NOT DETECTED Final    Comment: CRITICAL RESULT CALLED TO, READ BACK BY AND VERIFIED WITH: KAREN HAYES 03/06/16 1356 SGD    Staphylococcus aureus NOT DETECTED NOT DETECTED Final   Methicillin resistance DETECTED (A) NOT DETECTED Final    Comment: CRITICAL RESULT CALLED TO, READ BACK BY AND VERIFIED WITH: KAREN HAYES 03/06/16 1356 SGD    Streptococcus species NOT DETECTED NOT DETECTED Final   Streptococcus agalactiae NOT DETECTED NOT DETECTED Final   Streptococcus pneumoniae NOT DETECTED NOT DETECTED Final   Streptococcus pyogenes NOT DETECTED NOT DETECTED Final    Acinetobacter baumannii NOT DETECTED NOT DETECTED Final   Enterobacteriaceae species NOT DETECTED NOT DETECTED Final   Enterobacter cloacae complex NOT DETECTED NOT DETECTED Final   Escherichia coli NOT DETECTED NOT DETECTED Final   Klebsiella oxytoca NOT DETECTED NOT DETECTED Final   Klebsiella pneumoniae NOT DETECTED NOT DETECTED Final   Proteus species NOT DETECTED NOT DETECTED Final   Serratia marcescens NOT DETECTED NOT DETECTED Final   Haemophilus influenzae NOT DETECTED NOT DETECTED Final   Neisseria meningitidis NOT DETECTED NOT DETECTED Final   Pseudomonas aeruginosa NOT DETECTED NOT DETECTED Final   Candida albicans NOT DETECTED NOT DETECTED Final   Candida glabrata NOT DETECTED NOT DETECTED Final   Candida krusei NOT DETECTED NOT DETECTED Final   Candida parapsilosis NOT DETECTED NOT DETECTED Final   Candida tropicalis NOT DETECTED NOT DETECTED Final  C difficile quick scan w PCR reflex     Status: None   Collection Time: 03/05/16  4:29 PM  Result Value Ref Range Status   C Diff antigen NEGATIVE NEGATIVE Final   C Diff toxin NEGATIVE NEGATIVE Final   C Diff interpretation No C. difficile detected.  Final  MRSA PCR Screening     Status: Abnormal   Collection Time: 03/05/16  5:42 PM  Result Value Ref Range Status   MRSA by PCR POSITIVE (A) NEGATIVE Final    Comment:        The GeneXpert MRSA Assay (FDA approved for NASAL specimens only), is one component of a comprehensive MRSA colonization surveillance program. It is not intended to diagnose MRSA infection nor to guide or monitor treatment for MRSA infections. RESULT CALLED TO, READ BACK BY AND VERIFIED WITH: KELSEY BLACK 03/05/16 1915 KLW   Culture, blood (Routine X 2) w Reflex to ID Panel     Status: None (Preliminary result)   Collection Time: 03/07/16 11:22 AM  Result Value Ref Range Status   Specimen Description BLOOD RIGHT ANTECUBITAL  Final   Special Requests BOTTLES DRAWN AEROBIC AND ANAEROBIC  10CC  Final    Culture NO GROWTH 2 DAYS  Final   Report Status PENDING  Incomplete  Culture, blood (Routine X 2) w Reflex to ID Panel     Status: None (Preliminary result)   Collection Time: 03/07/16 11:22 AM  Result Value Ref Range Status   Specimen Description BLOOD LEFT HAND  Final   Special Requests   Final    BOTTLES DRAWN AEROBIC AND ANAEROBIC  AER 5CC ANA 6CC   Culture NO GROWTH 2 DAYS  Final   Report Status PENDING  Incomplete    IMAGING: Dg Chest 1 View  Result Date: 03/08/2016 CLINICAL DATA:  Fever, diaphoresis home history COPD, hypertension, diabetes mellitus, stroke EXAM: CHEST 1 VIEW COMPARISON:  Portable exam 1517 hours compared to 03/07/2016 FINDINGS: Mild enlargement of cardiac silhouette. Mediastinal contours and pulmonary vascularity normal. Lungs clear. No pleural effusion or pneumothorax. No focal  osseous abnormalities. IMPRESSION: No acute abnormalities. Mild enlargement of cardiac silhouette. Electronically Signed   By: Lavonia Dana M.D.   On: 03/08/2016 16:14   Dg Abd 1 View  Result Date: 03/05/2016 CLINICAL DATA:  70 year old female with nausea and vomiting for 2 days. Initial encounter. EXAM: ABDOMEN - 1 VIEW COMPARISON:  Portable chest today reported separately. Pelvis radiograph 10/16/2006. FINDINGS: Portable AP supine view at 1841 hours. Abundant gas in mildly dilated small bowel loops, and throughout the large bowel. Similar mild gaseous distension of the stomach. No definite pneumoperitoneum on these supine views. Extensive degenerative osseous changes in the spine. No acute osseous abnormality identified. Foley catheter or rectal catheter in place. IMPRESSION: 1. Abundant bowel gas in mildly dilated small bowel as well as throughout the colon. Favor ileus over a mechanical bowel obstruction at this time. 2. No pneumoperitoneum identified on these supine images. Electronically Signed   By: Genevie Ann M.D.   On: 03/05/2016 19:19   Dg Chest Port 1 View  Result Date:  03/07/2016 CLINICAL DATA:  Acute respiratory failure EXAM: PORTABLE CHEST 1 VIEW COMPARISON:  03/06/2016 FINDINGS: Lungs remain clear without infiltrate or effusion. Decreased lung volume unchanged. Negative for heart failure or effusion. IMPRESSION: No active disease. Electronically Signed   By: Franchot Gallo M.D.   On: 03/07/2016 07:18   Dg Chest Port 1 View  Result Date: 03/06/2016 CLINICAL DATA:  Acute respiratory failure EXAM: PORTABLE CHEST 1 VIEW COMPARISON:  03/05/2016 FINDINGS: A single AP portable view of the chest demonstrates no focal airspace consolidation or alveolar edema. The lungs are grossly clear. There is no large effusion or pneumothorax. Cardiac and mediastinal contours appear unremarkable. IMPRESSION: No active disease. Electronically Signed   By: Andreas Newport M.D.   On: 03/06/2016 03:48   Dg Chest Portable 1 View  Result Date: 03/05/2016 CLINICAL DATA:  Unresponsive.  Hypertension. EXAM: PORTABLE CHEST 1 VIEW COMPARISON:  02/16/2016. FINDINGS: Fullness is noted of the upper mediastinum, this may be secondary to AP portable technique. PA and lateral chest x-ray can be obtained to further evaluate . Cardiomegaly with normal pulmonary vascularity. No focal infiltrate. No pleural effusion. Diffuse degenerative change thoracic spine . IMPRESSION: 1. Fullness in the upper mediastinum, this may be secondary to AP portable technique. PA and lateral chest x-ray can be obtained for further evaluation. 2. Mild cardiomegaly.  No focal pulmonary infiltrate. Electronically Signed   By: Marcello Moores  Register   On: 03/05/2016 15:31   Dg Foot Complete Left  Result Date: 03/06/2016 CLINICAL DATA:  ZORIANNA TALIAFERRO is a 70 y.o. female with a history of COPD, diabetes, morbid obesity, and stroke, brought in by EMS for decreased mental status. It sounds like this morning she was difficult to arouse. Edema of the foot. 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 No acute fracture or traumatic subluxation. There are degenerative changes at the first metatarsophalangeal joint. Marked plantar flexion again noted on the lateral view. Plantar and Achilles spurs appears stable. No radiopaque foreign body or gas. No evidence for cortical erosion. IMPRESSION: Stable postoperative changes in the foot. No evidence for soft tissue gas or acute osseous abnormality. Electronically Signed   By: Nolon Nations M.D.   On: 03/06/2016 13:41    Assessment:   Molly [name] is a 70 y.o. female admitted with sepsis, fever, leukocystosis and acute respiratory failure. History is limited but apparently  vomited the day prior. Imaging and cultures are negative. Improving with IV abx.   No obvious what caused this but I suspect given her condition and the vomiting that she had an aspiration event leading to resp failure.  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 The site does not appear actively infected.  The coag neg staph bacteremia I do not think is significant.  MRSA PCR is +. Currently day 5 of IV vanco and zosyn.   Recommendations I would suggest changing to oral doxycyline and augmentin at DC to complete a total 10 days of abx since admission Is she should worsen would need CT imaging of chest abd pelvis to rule out an abscess but would not suggest that at this point.  Thank you very much for allowing me to participate in the care of this patient. Please call with questions.   Cheral Marker. Ola Spurr, MD

## 2016-03-09 NOTE — Progress Notes (Signed)
Patient ID: Molly [name] Najarro, female   DOB: [birth year], 70 y.o.   MRN: [id number]  [facility] PROGRESS NOTE  Molly [name] Torosyan MVH:[id number] DOB: [birth year] DOA: 03/05/2016 PCP: Elita Booneoberts, Caroline C, MD  HPI/Subjective: Patient feels okay. Offers no complaints. As per husband, in the mornings she is usually more tired. Patient answers some yes or no questions. Patient has previous stroke and can't move her left arm. Very weak on the left leg.  Objective: Vitals:   03/09/16 0440 03/09/16 0732  BP: (!) 147/65 (!) 150/84  Pulse: 89 86  Resp: 20 18  Temp: 99 F (37.2 C)     Filed Weights   03/07/16 0441 03/08/16 0420 03/09/16 0500  Weight: 104.8 kg (231 lb 0.7 oz) 107.1 kg (236 lb 1.8 oz) 102 kg (224 lb 12.8 oz)    ROS: Review of Systems  Unable to perform ROS: Acuity of condition  Respiratory: Negative for cough and shortness of breath.   Cardiovascular: Negative for chest pain.  Gastrointestinal: Negative for abdominal pain, constipation, diarrhea, nausea and vomiting.   Exam: Physical Exam  HENT:  Nose: No mucosal edema.  Mouth/Throat: No oropharyngeal exudate or posterior oropharyngeal edema.  Eyes: Conjunctivae, EOM and lids are normal. Pupils are equal, round, and reactive to light.  Neck: No JVD present. Carotid bruit is not present. No edema present. Thyroid mass present. No thyromegaly present.  Cardiovascular: S1 normal and S2 normal.  Exam reveals no gallop.   No murmur heard. Pulses:      Dorsalis pedis pulses are 2+ on the right side, and 2+ on the left side.  Respiratory: No respiratory distress. She has no wheezes. She has no rhonchi. She has no rales.  GI: Soft. Bowel sounds are normal. There is no tenderness.  Musculoskeletal:       Right ankle: She exhibits swelling.       Left ankle: She exhibits swelling.  Lymphadenopathy:    She has no cervical adenopathy.  Neurological: She is alert.  Left leg weakness. Left arm able to squeeze with her fingers  but unable to move left elbow or left shoulder.  Skin: Skin is warm. Nails show no clubbing.  Chronic upper extremity discoloration  Psychiatric: She has a normal mood and affect.      Data Reviewed: Basic Metabolic Panel:  Recent Labs Lab 03/05/16 1412 03/05/16 1855 03/06/16 0100 03/07/16 0510 03/09/16 0312  NA 134* 136 136 140  --   K 5.3* 5.1 4.6 4.2  --   CL 97* 103 106 108  --   CO2 26 25 26 28   --   GLUCOSE 342* 303* 241* 116*  --   BUN 42* 35* 36* 19  --   CREATININE 2.10* 1.83* 1.37* 0.88 0.72  CALCIUM 9.3 8.0* 7.8* 7.8*  --   MG  --   --  3.2* 2.0  --   PHOS  --   --  3.3  --   --    Liver Function Tests:  Recent Labs Lab 03/05/16 1412  AST 27  ALT 34  ALKPHOS 112  BILITOT 1.2  PROT 6.9  ALBUMIN 3.0*   CBC:  Recent Labs Lab 03/05/16 1412 03/06/16 0100 03/07/16 0510 03/09/16 0312  WBC 29.9* 27.7* 18.8* 13.4*  HGB 14.8 12.3 11.3* 11.0*  HCT 45.6 37.5 34.8* 33.0*  MCV 92.8 92.7 93.9 92.5  PLT 328 247 230 270    CBG:  Recent Labs Lab 03/08/16 1102 03/08/16 1715 03/08/16 2115 03/09/16 0735  03/09/16 1124  GLUCAP 169* 150* 146* 112* 177*    Recent Results (from the past 240 hour(s))  Culture, blood (Routine x 2)     Status: None (Preliminary result)   Collection Time: 03/05/16  2:12 PM  Result Value Ref Range Status   Specimen Description BLOOD RIGHT AC  Final   Special Requests   Final    BOTTLES DRAWN AEROBIC AND ANAEROBIC AER 7ML ANA 7ML   Culture NO GROWTH 4 DAYS  Final   Report Status PENDING  Incomplete  Urine culture     Status: None   Collection Time: 03/05/16  2:12 PM  Result Value Ref Range Status   Specimen Description URINE, RANDOM  Final   Special Requests NONE  Final   Culture NO GROWTH [facility]   Final   Report Status 03/06/2016 FINAL  Final  Culture, blood (Routine x 2)     Status: Abnormal   Collection Time: 03/05/16  2:50 PM  Result Value Ref Range Status   Specimen Description BLOOD  LEFT AC  Final   Special Requests   Final    BOTTLES DRAWN AEROBIC AND ANAEROBIC AER 13ML ANA 12ML   Culture  Setup Time   Final    GRAM POSITIVE COCCI IN BOTH AEROBIC AND ANAEROBIC BOTTLES CRITICAL RESULT CALLED TO, READ BACK BY AND VERIFIED WITH: KAREN HAYES 03/06/16 1356 SGD    Culture (A)  Final    STAPHYLOCOCCUS SPECIES (COAGULASE NEGATIVE) THE SIGNIFICANCE OF ISOLATING THIS ORGANISM FROM A SINGLE SET OF BLOOD CULTURES WHEN MULTIPLE SETS ARE DRAWN IS UNCERTAIN. PLEASE NOTIFY THE MICROBIOLOGY DEPARTMENT WITHIN ONE WEEK IF SPECIATION AND SENSITIVITIES ARE REQUIRED. Performed [facility]    Report Status 03/08/2016 FINAL  Final  Blood Culture ID Panel (Reflexed)     Status: Abnormal   Collection Time: 03/05/16  2:50 PM  Result Value Ref Range Status   Enterococcus species NOT DETECTED NOT DETECTED Final   Listeria monocytogenes NOT DETECTED NOT DETECTED Final   Staphylococcus species DETECTED (A) NOT DETECTED Final    Comment: CRITICAL RESULT CALLED TO, READ BACK BY AND VERIFIED WITH: KAREN HAYES 03/06/16 1356 SGD    Staphylococcus aureus NOT DETECTED NOT DETECTED Final   Methicillin resistance DETECTED (A) NOT DETECTED Final    Comment: CRITICAL RESULT CALLED TO, READ BACK BY AND VERIFIED WITH: KAREN HAYES 03/06/16 1356 SGD    Streptococcus species NOT DETECTED NOT DETECTED Final   Streptococcus agalactiae NOT DETECTED NOT DETECTED Final   Streptococcus pneumoniae NOT DETECTED NOT DETECTED Final   Streptococcus pyogenes NOT DETECTED NOT DETECTED Final   Acinetobacter baumannii NOT DETECTED NOT DETECTED Final   Enterobacteriaceae species NOT DETECTED NOT DETECTED Final   Enterobacter cloacae complex NOT DETECTED NOT DETECTED Final   Escherichia coli NOT DETECTED NOT DETECTED Final   Klebsiella oxytoca NOT DETECTED NOT DETECTED Final   Klebsiella pneumoniae NOT DETECTED NOT DETECTED Final   Proteus species NOT DETECTED NOT DETECTED Final   Serratia marcescens NOT  DETECTED NOT DETECTED Final   Haemophilus influenzae NOT DETECTED NOT DETECTED Final   Neisseria meningitidis NOT DETECTED NOT DETECTED Final   Pseudomonas aeruginosa NOT DETECTED NOT DETECTED Final   Candida albicans NOT DETECTED NOT DETECTED Final   Candida glabrata NOT DETECTED NOT DETECTED Final   Candida krusei NOT DETECTED NOT DETECTED Final   Candida parapsilosis NOT DETECTED NOT DETECTED Final   Candida tropicalis NOT DETECTED NOT DETECTED Final  C difficile quick  scan w PCR reflex     Status: None   Collection Time: 03/05/16  4:29 PM  Result Value Ref Range Status   C Diff antigen NEGATIVE NEGATIVE Final   C Diff toxin NEGATIVE NEGATIVE Final   C Diff interpretation No C. difficile detected.  Final  MRSA PCR Screening     Status: Abnormal   Collection Time: 03/05/16  5:42 PM  Result Value Ref Range Status   MRSA by PCR POSITIVE (A) NEGATIVE Final    Comment:        The GeneXpert MRSA Assay (FDA approved for NASAL specimens only), is one component of a comprehensive MRSA colonization surveillance program. It is not intended to diagnose MRSA infection nor to guide or monitor treatment for MRSA infections. RESULT CALLED TO, READ BACK BY AND VERIFIED WITH: KELSEY BLACK 03/05/16 1915 KLW   Culture, blood (Routine X 2) w Reflex to ID Panel     Status: None (Preliminary result)   Collection Time: 03/07/16 11:22 AM  Result Value Ref Range Status   Specimen Description BLOOD RIGHT ANTECUBITAL  Final   Special Requests BOTTLES DRAWN AEROBIC AND ANAEROBIC  10CC  Final   Culture NO GROWTH 2 DAYS  Final   Report Status PENDING  Incomplete  Culture, blood (Routine X 2) w Reflex to ID Panel     Status: None (Preliminary result)   Collection Time: 03/07/16 11:22 AM  Result Value Ref Range Status   Specimen Description BLOOD LEFT HAND  Final   Special Requests   Final    BOTTLES DRAWN AEROBIC AND ANAEROBIC  AER 5CC ANA 6CC   Culture NO GROWTH 2 DAYS  Final   Report Status PENDING   Incomplete     Studies: Dg Chest 1 View  Result Date: 03/08/2016 CLINICAL DATA:  Fever, diaphoresis home history COPD, hypertension, diabetes mellitus, stroke EXAM: CHEST 1 VIEW COMPARISON:  Portable exam 1517 hours compared to 03/07/2016 FINDINGS: Mild enlargement of cardiac silhouette. Mediastinal contours and pulmonary vascularity normal. Lungs clear. No pleural effusion or pneumothorax. No focal osseous abnormalities. IMPRESSION: No acute abnormalities. Mild enlargement of cardiac silhouette. Electronically Signed   By: Ulyses Southward M.D.   On: 03/08/2016 16:14    Scheduled Meds: . amitriptyline  50 mg Oral QHS  . aspirin EC  81 mg Oral Daily  . atorvastatin  40 mg Oral q1800  . buPROPion  300 mg Oral Daily  . clopidogrel  75 mg Oral Daily  . diazepam  2.5 mg Oral BH-q7a  . Influenza vac split quadrivalent PF  0.5 mL Intramuscular Tomorrow-1000  . insulin aspart  0-20 Units Subcutaneous TID AC & HS  . losartan  50 mg Oral Daily  . meclizine  25 mg Oral TID  . mupirocin ointment  1 application Nasal BID  . pantoprazole  40 mg Oral Daily  . piperacillin-tazobactam (ZOSYN)  IV  3.375 g Intravenous Q8H  . polyethylene glycol  17 g Oral Daily  . senna  1 tablet Oral BID  . vancomycin  750 mg Intravenous Q12H    Assessment/Plan:  1. Acute respiratory with hypoxia. Patient initially required BiPAP and was admitted to the critical care service. Patient's breathing comfortably at this point.Check an overnight oximetry on room air. 2. Fever, tachycardia and leukocytosis. Patient also had lactic acidosis and high pro-calcitonin. Unclear where the source of sepsis is. Repeated chest x-ray negative. Case discussed with Dr. Sampson Goon infectious disease and he recommends changing the vancomycin and Zosyn over to  doxycycline and Augmentin for total of 10 days. 3. Hypotension on presentation. This has resolved with IV fluid hydration 4. Dehydration and acute kidney injury. This has resolved with  IV fluid hydration. 5. Thyroid mass. Was set up for an outpatient biopsy this Friday. Thyroid function tests normal. This will have to be rescheduled. 6. History of stroke with left-sided weakness on aspirin and Plavix 7. Hyperlipidemia unspecified on atorvastatin 8. History of COPD 9. History of anxiety on Wellbutrin and Valium 10. Type 2 diabetes mellitus. On sliding scale for right now. May be able to add low-dose Lantus tonight.  Code Status:     Code Status Orders        Start     Ordered   03/05/16 1548  Full code  Continuous     03/05/16 1549    Code Status History    Date Active Date Inactive Code Status Order ID Comments User Context   02/06/2016  3:32 PM 02/11/2016  7:25 PM Full Code [id number]  Wyatt Haste, MD ED   02/01/2016  9:17 PM 02/05/2016  1:59 PM Full Code [id number]  Auburn Bilberry, MD Inpatient   01/14/2016  2:01 PM 01/16/2016 10:22 PM Full Code [id number]  Katha Hamming, MD ED   11/19/2014 10:40 PM 11/21/2014  8:55 PM Full Code [id number]  Oralia Manis, MD Inpatient   05/22/2014  6:31 PM 05/25/2014  8:20 PM Full Code [id number]  Toni Arthurs, MD Inpatient   05/21/2014 10:31 PM 05/22/2014  6:31 PM Full Code [id number]  Yevonne Pax, MD Inpatient     Family Communication: Spoke with husband At the bedside Disposition Plan: To be determined  Consultants:  Critical care specialist signed off  Antibiotics:  Doxycycline  Augmentin  Time spent: 30 minutes total time today including a ACP time.  Alford Highland  [employer]

## 2016-03-09 NOTE — Clinical Social Work Note (Addendum)
Clinical Social Work Assessment  Patient Details  Name: Molly [name] MRN: [id number] Date of Birth: 1945-04-15  Date of referral:  03/09/16               Reason for consult:  Facility Placement                Permission sought to share information with:  Chartered certified accountant granted to share information::  Yes, Verbal Permission Granted  Name::      [employer]::   [place]   Relationship::     Contact Information:     Housing/Transportation Living arrangements for the past 2 months:  [place], [place] of Information:  Spouse Patient Interpreter Needed:  None Criminal Activity/Legal Involvement Pertinent to Current Situation/Hospitalization:  No - Comment as needed Significant Relationships:  Adult Children, Spouse Lives with:  Facility Resident, Spouse Do you feel safe going back to the place where you live?  Yes Need for family participation in patient care:  Yes (Comment)  Care giving concerns:  Patient came to [facility] from [facility], where she was at for short term rehab.    Social Worker assessment / plan:  Holiday representative (CSW) met with patient's husband Reily Treloar [phone] outside of patient's room. Per husband patient is not able to walk or care for herself right now and he wishes for her to return to [facility]. CSW explained to husband that patient's insurance [employer] will require another authorization in order for her to return to [facility] and there is a chance [employer] will deny SNF if patient is not able to participate with PT. Husband verbalized his understanding. Per husband him and patient live in [place]. Husband reported that he has had several back surgeries and cannot lift patient at this time. Per MD patient does not require a Bi-pap at this time. CSW will send PT notes to [facility] to start [facility] authorization as soon as PT evaluation is available. CSW  will continue to follow and assist as needed. [name] admissions coordinator at [facility] is aware of above.   Employment status:  Retired Nurse, adult PT Recommendations:  Not assessed at this time Information / Referral to community resources:  [place]  Patient/Family's Response to care:  Patient's husband wants patient to return to [facility] for rehab.   Patient/Family's Understanding of and Emotional Response to Diagnosis, Current Treatment, and Prognosis:  Patient's husband was very pleasant and thanked CSW for assistance.   Emotional Assessment Appearance:  Appears stated age Attitude/Demeanor/Rapport:    Affect (typically observed):  Accepting, Adaptable, Pleasant Orientation:  Oriented to Self, Oriented to Place, Fluctuating Orientation (Suspected and/or reported Sundowners) Alcohol / Substance use:  Not Applicable Psych involvement (Current and /or in the community):  No (Comment)  Discharge Needs  Concerns to be addressed:  Discharge Planning Concerns Readmission within the last 30 days:  No Current discharge risk:  Dependent with Mobility Barriers to Discharge:  Continued Medical Work up   [employer], Veronia Beets, LCSW 03/09/2016, 2:15 PM

## 2016-03-09 NOTE — Progress Notes (Signed)
Patient ID: Molly HughsBrenda P Malcomb, female   DOB: 09/21/1945, 70 y.o.   MRN: [id number]  ACP discussion.  Husband and patient present.  Conversation about CODE STATUS brought up and discussed at length. Patient will remain a full code.  Patient admitted with severe sepsis and multiorgan failure. She has improved at this point and I'm not sure what the source of the sepsis is. Antibiotics will be switched over to oral today.  Social worker stated that the patient will need another prior authorization for rehabilitation prior to disposition.  At this point the patient is bedbound secondary to prior stroke and weakness on her left side.  The husband would eventually like to take the patient home but she will need to be able to move around better.  Dr. Alford Highlandichard Bern Fare Time spent with ACP discussion 17 minutes.

## 2016-03-10 ENCOUNTER — Inpatient Hospital Stay: Payer: Medicare HMO

## 2016-03-10 LAB — CBC
HCT: 36.2 % (ref 35.0–47.0)
Hemoglobin: 12.1 g/dL (ref 12.0–16.0)
MCH: 30.1 pg (ref 26.0–34.0)
MCHC: 33.4 g/dL (ref 32.0–36.0)
MCV: 90.1 fL (ref 80.0–100.0)
PLATELETS: 323 10*3/uL (ref 150–440)
RBC: 4.02 MIL/uL (ref 3.80–5.20)
RDW: 13.7 % (ref 11.5–14.5)
WBC: 15.9 10*3/uL — ABNORMAL HIGH (ref 3.6–11.0)

## 2016-03-10 LAB — BLOOD GAS, VENOUS
Acid-Base Excess: 6 mmol/L — ABNORMAL HIGH (ref 0.0–2.0)
BICARBONATE: 31.8 mmol/L — AB (ref 20.0–28.0)
O2 SAT: 77.3 %
PATIENT TEMPERATURE: 37
PO2 VEN: 41 mmHg (ref 32.0–45.0)
pCO2, Ven: 49 mmHg (ref 44.0–60.0)
pH, Ven: 7.42 (ref 7.250–7.430)

## 2016-03-10 LAB — CULTURE, BLOOD (ROUTINE X 2): Culture: NO GROWTH

## 2016-03-10 LAB — BASIC METABOLIC PANEL
Anion gap: 8 (ref 5–15)
BUN: 10 mg/dL (ref 6–20)
CALCIUM: 9 mg/dL (ref 8.9–10.3)
CO2: 30 mmol/L (ref 22–32)
Chloride: 96 mmol/L — ABNORMAL LOW (ref 101–111)
Creatinine, Ser: 0.65 mg/dL (ref 0.44–1.00)
GFR calc Af Amer: >60 mL/min (ref >60)
GLUCOSE: 192 mg/dL — AB (ref 65–99)
POTASSIUM: 3.6 mmol/L (ref 3.5–5.1)
Sodium: 134 mmol/L — ABNORMAL LOW (ref 135–145)

## 2016-03-10 LAB — GLUCOSE, CAPILLARY
GLUCOSE-CAPILLARY: 194 mg/dL — AB (ref 65–99)
Glucose-Capillary: 170 mg/dL — ABNORMAL HIGH (ref 65–99)
Glucose-Capillary: 172 mg/dL — ABNORMAL HIGH (ref 65–99)
Glucose-Capillary: 186 mg/dL — ABNORMAL HIGH (ref 65–99)

## 2016-03-10 LAB — AMMONIA: Ammonia: 21 umol/L (ref 9–35)

## 2016-03-10 MED ORDER — QUETIAPINE FUMARATE 25 MG PO TABS
12.5000 mg | ORAL_TABLET | Freq: Every day | ORAL | Status: DC
  Administered 2016-03-10: 12.5 mg via ORAL
  Filled 2016-03-10: qty 1

## 2016-03-10 MED ORDER — QUETIAPINE FUMARATE 25 MG PO TABS: 25.0000 mg | ORAL_TABLET | Freq: Every day | ORAL | Status: DC

## 2016-03-10 NOTE — Progress Notes (Signed)
Patient very lethargic this am. MD present and aware. New orders for venous blood gas. Bipap at night, overnight pulse ox previous shift with sats 88-93%. Will continue to monitor. Holding all am meds per MD conversation until more alert

## 2016-03-10 NOTE — Progress Notes (Signed)
Patient ID: Molly [name], female   DOB: 09/11/1945, 70 y.o.   MRN: [id number]  [facility] PROGRESS NOTE  Molly [name] WUJ:[id number] DOB: 09/11/1945 DOA: 03/05/2016 PCP: Elita Booneoberts, Caroline C, MD  HPI/Subjective: Nurse doing assessment and having a hard time waking her up. As per the husband yesterday states that she normally sleeps all morning.  With sternal rub she moved her right arm and moaned.   Objective: Vitals:   03/10/16 0347 03/10/16 0724  BP: (!) 159/76 140/88  Pulse: 91 96  Resp: 20 20  Temp: 97.6 F (36.4 C) 98.6 F (37 C)    Filed Weights   03/08/16 0420 03/09/16 0500 03/10/16 0500  Weight: 107.1 kg (236 lb 1.8 oz) 102 kg (224 lb 12.8 oz) 101.2 kg (223 lb 1.6 oz)    ROS: Review of Systems  Unable to perform ROS: Patient unresponsive   Exam: Physical Exam  Constitutional: She appears lethargic.  HENT:  Nose: No mucosal edema.  Mouth/Throat: No oropharyngeal exudate or posterior oropharyngeal edema.  Eyes: Conjunctivae, EOM and lids are normal. Pupils are equal, round, and reactive to light.  Neck: No JVD present. Carotid bruit is not present. No edema present. Thyroid mass present. No thyromegaly present.  Cardiovascular: S1 normal and S2 normal.  Exam reveals no gallop.   No murmur heard. Pulses:      Dorsalis pedis pulses are 2+ on the right side, and 2+ on the left side.  Respiratory: No respiratory distress. She has no wheezes. She has no rhonchi. She has no rales.  GI: Soft. Bowel sounds are normal. There is no tenderness.  Musculoskeletal:       Right ankle: She exhibits swelling.       Left ankle: She exhibits swelling.  Lymphadenopathy:    She has no cervical adenopathy.  Neurological: She appears lethargic.  Moved her right arm with sternal rub and moaned  Skin: Skin is warm. Nails show no clubbing.  Chronic upper extremity discoloration  Psychiatric:  Lethargic. With sternal rub, she moved her right arm and groans and tried to talk.  But fell back asleep.      Data Reviewed: Basic Metabolic Panel:  Recent Labs Lab 03/05/16 1412 03/05/16 1855 03/06/16 0100 03/07/16 0510 03/09/16 0312  NA 134* 136 136 140  --   K 5.3* 5.1 4.6 4.2  --   CL 97* 103 106 108  --   CO2 26 25 26 28   --   GLUCOSE 342* 303* 241* 116*  --   BUN 42* 35* 36* 19  --   CREATININE 2.10* 1.83* 1.37* 0.88 0.72  CALCIUM 9.3 8.0* 7.8* 7.8*  --   MG  --   --  3.2* 2.0  --   PHOS  --   --  3.3  --   --    Liver Function Tests:  Recent Labs Lab 03/05/16 1412  AST 27  ALT 34  ALKPHOS 112  BILITOT 1.2  PROT 6.9  ALBUMIN 3.0*   CBC:  Recent Labs Lab 03/05/16 1412 03/06/16 0100 03/07/16 0510 03/09/16 0312  WBC 29.9* 27.7* 18.8* 13.4*  HGB 14.8 12.3 11.3* 11.0*  HCT 45.6 37.5 34.8* 33.0*  MCV 92.8 92.7 93.9 92.5  PLT 328 247 230 270    CBG:  Recent Labs Lab 03/09/16 0735 03/09/16 1124 03/09/16 1615 03/09/16 2058 03/10/16 0726  GLUCAP 112* 177* 137* 227* 170*    Recent Results (from the past 240 hour(s))  Culture, blood (Routine x  2)     Status: None (Preliminary result)   Collection Time: 03/05/16  2:12 PM  Result Value Ref Range Status   Specimen Description BLOOD RIGHT AC  Final   Special Requests   Final    BOTTLES DRAWN AEROBIC AND ANAEROBIC AER ANA   Culture NO GROWTH 4 DAYS  Final   Report Status PENDING  Incomplete  Urine culture     Status: None   Collection Time: 03/05/16  2:12 PM  Result Value Ref Range Status   Specimen Description URINE, RANDOM  Final   Special Requests NONE  Final   Culture NO GROWTH Performed at [facility]   Final   Report Status 03/06/2016 FINAL  Final  Culture, blood (Routine x 2)     Status: Abnormal   Collection Time: 03/05/16  2:50 PM  Result Value Ref Range Status   Specimen Description BLOOD LEFT AC  Final   Special Requests   Final    BOTTLES DRAWN AEROBIC AND ANAEROBIC AER ANA   Culture  Setup Time   Final    GRAM POSITIVE COCCI IN  BOTH AEROBIC AND ANAEROBIC BOTTLES CRITICAL RESULT CALLED TO, READ BACK BY AND VERIFIED WITH: KAREN HAYES 03/06/16 1356 SGD    Culture (A)  Final    STAPHYLOCOCCUS SPECIES (COAGULASE NEGATIVE) THE SIGNIFICANCE OF ISOLATING THIS ORGANISM FROM A SINGLE SET OF BLOOD CULTURES WHEN MULTIPLE SETS ARE DRAWN IS UNCERTAIN. PLEASE NOTIFY THE MICROBIOLOGY DEPARTMENT WITHIN ONE WEEK IF SPECIATION AND SENSITIVITIES ARE REQUIRED. Performed at [facility]    Report Status 03/08/2016 FINAL  Final  Blood Culture ID Panel (Reflexed)     Status: Abnormal   Collection Time: 03/05/16  2:50 PM  Result Value Ref Range Status   Enterococcus species NOT DETECTED NOT DETECTED Final   Listeria monocytogenes NOT DETECTED NOT DETECTED Final   Staphylococcus species DETECTED (A) NOT DETECTED Final    Comment: CRITICAL RESULT CALLED TO, READ BACK BY AND VERIFIED WITH: KAREN HAYES 03/06/16 1356 SGD    Staphylococcus aureus NOT DETECTED NOT DETECTED Final   Methicillin resistance DETECTED (A) NOT DETECTED Final    Comment: CRITICAL RESULT CALLED TO, READ BACK BY AND VERIFIED WITH: KAREN HAYES 03/06/16 1356 SGD    Streptococcus species NOT DETECTED NOT DETECTED Final   Streptococcus agalactiae NOT DETECTED NOT DETECTED Final   Streptococcus pneumoniae NOT DETECTED NOT DETECTED Final   Streptococcus pyogenes NOT DETECTED NOT DETECTED Final   Acinetobacter baumannii NOT DETECTED NOT DETECTED Final   Enterobacteriaceae species NOT DETECTED NOT DETECTED Final   Enterobacter cloacae complex NOT DETECTED NOT DETECTED Final   Escherichia coli NOT DETECTED NOT DETECTED Final   Klebsiella oxytoca NOT DETECTED NOT DETECTED Final   Klebsiella pneumoniae NOT DETECTED NOT DETECTED Final   Proteus species NOT DETECTED NOT DETECTED Final   Serratia marcescens NOT DETECTED NOT DETECTED Final   Haemophilus influenzae NOT DETECTED NOT DETECTED Final   Neisseria meningitidis NOT DETECTED NOT DETECTED Final   Pseudomonas  aeruginosa NOT DETECTED NOT DETECTED Final   Candida albicans NOT DETECTED NOT DETECTED Final   Candida glabrata NOT DETECTED NOT DETECTED Final   Candida krusei NOT DETECTED NOT DETECTED Final   Candida parapsilosis NOT DETECTED NOT DETECTED Final   Candida tropicalis NOT DETECTED NOT DETECTED Final  C difficile quick scan w PCR reflex     Status: None   Collection Time: 03/05/16  4:29 PM  Result Value Ref Range Status  C Diff antigen NEGATIVE NEGATIVE Final   C Diff toxin NEGATIVE NEGATIVE Final   C Diff interpretation No C. difficile detected.  Final  MRSA PCR Screening     Status: Abnormal   Collection Time: 03/05/16  5:42 PM  Result Value Ref Range Status   MRSA by PCR POSITIVE (A) NEGATIVE Final    Comment:        The GeneXpert MRSA Assay (FDA approved for NASAL specimens only), is one component of a comprehensive MRSA colonization surveillance program. It is not intended to diagnose MRSA infection nor to guide or monitor treatment for MRSA infections. RESULT CALLED TO, READ BACK BY AND VERIFIED WITH: KELSEY BLACK 03/05/16 1915 KLW   Culture, blood (Routine X 2) w Reflex to ID Panel     Status: None (Preliminary result)   Collection Time: 03/07/16 11:22 AM  Result Value Ref Range Status   Specimen Description BLOOD RIGHT ANTECUBITAL  Final   Special Requests BOTTLES DRAWN AEROBIC AND ANAEROBIC  10CC  Final   Culture NO GROWTH 2 DAYS  Final   Report Status PENDING  Incomplete  Culture, blood (Routine X 2) w Reflex to ID Panel     Status: None (Preliminary result)   Collection Time: 03/07/16 11:22 AM  Result Value Ref Range Status   Specimen Description BLOOD LEFT HAND  Final   Special Requests   Final    BOTTLES DRAWN AEROBIC AND ANAEROBIC  AER 5CC ANA 6CC   Culture NO GROWTH 2 DAYS  Final   Report Status PENDING  Incomplete     Studies: Dg Chest 1 View  Result Date: 03/08/2016 CLINICAL DATA:  Fever, diaphoresis home history COPD, hypertension, diabetes  mellitus, stroke EXAM: CHEST 1 VIEW COMPARISON:  Portable exam 1517 hours compared to 03/07/2016 FINDINGS: Mild enlargement of cardiac silhouette. Mediastinal contours and pulmonary vascularity normal. Lungs clear. No pleural effusion or pneumothorax. No focal osseous abnormalities. IMPRESSION: No acute abnormalities. Mild enlargement of cardiac silhouette. Electronically Signed   By: Ulyses Southward M.D.   On: 03/08/2016 16:14    Scheduled Meds: . amitriptyline  50 mg Oral QHS  . amoxicillin-clavulanate  1 tablet Oral Q12H  . aspirin EC  81 mg Oral Daily  . atorvastatin  40 mg Oral q1800  . buPROPion  300 mg Oral Daily  . clopidogrel  75 mg Oral Daily  . diazepam  2.5 mg Oral BH-q7a  . doxycycline  100 mg Oral Q12H  . insulin aspart  0-20 Units Subcutaneous TID AC & HS  . insulin glargine  6 Units Subcutaneous QHS  . losartan  50 mg Oral Daily  . meclizine  25 mg Oral TID  . mupirocin ointment  1 application Nasal BID  . pantoprazole  40 mg Oral Daily  . polyethylene glycol  17 g Oral Daily  . senna  1 tablet Oral BID    Assessment/Plan:  1. Lethargy this morning. We'll get a venous blood gas and laboratory data including ammonia level. If she doesn't wake up will have to do brain imaging. But as per the husband she normally sleeps all morning.  2. Acute respiratory with hypoxia. Patient initially required BiPAP and was admitted to the critical care service. Patient's breathing comfortably at this point.Check an overnight oximetry on room air. 3. Fever, tachycardia and leukocytosis. This has improved. Patient also had lactic acidosis and high pro-calcitonin. Finish course of doxycycline and Augmentin for total of 10 days. 4. Hypotension on presentation. This has resolved with  IV fluid hydration 5. Dehydration and acute kidney injury. This has resolved with IV fluid hydration. 6. Thyroid mass. Was set up for an outpatient biopsy this Friday. Thyroid function tests normal. This will have to  be rescheduled. 7. History of stroke with left-sided weakness on aspirin and Plavix 8. Hyperlipidemia unspecified on atorvastatin 9. History of COPD 10. History of anxiety on Wellbutrin and Valium 11. Type 2 diabetes mellitus. On sliding scale for right now. Hold Lantus  Code Status:     Code Status Orders        Start     Ordered   03/05/16 1548  Full code  Continuous     03/05/16 1549    Code Status History    Date Active Date Inactive Code Status Order ID Comments User Context   02/06/2016  3:32 PM 02/11/2016  7:25 PM Full Code [id number]  Wyatt Hasteavid K Hower, MD ED   02/01/2016  9:17 PM 02/05/2016  1:59 PM Full Code [id number]  Auburn BilberryShreyang Patel, MD Inpatient   01/14/2016  2:01 PM 01/16/2016 10:22 PM Full Code [id number]  Katha HammingSnehalatha Konidena, MD ED   11/19/2014 10:40 PM 11/21/2014  8:55 PM Full Code [id number]  Oralia Manisavid Willis, MD Inpatient   05/22/2014  6:31 PM 05/25/2014  8:20 PM Full Code [id number]  Toni ArthursJohn Hewitt, MD Inpatient   05/21/2014 10:31 PM 05/22/2014  6:31 PM Full Code [id number]  Yevonne PaxSaadat A Khan, MD Inpatient     Family Communication: Spoke with husband Yesterday. Disposition Plan: Needs prior authorization to go back to rehabilitation. We'll need to work with physical therapy prior to this decision.  Consultants:  Critical care specialist signed off  Antibiotics:  Doxycycline  Augmentin  Time spent: 20 minutes.  Alford HighlandWIETING, Shamere Dilworth  [employer]

## 2016-03-10 NOTE — Progress Notes (Signed)
PT Cancellation Note  Patient Details Name: Ardis HughsBrenda P Rae MRN: [id number] DOB: March 07, 1946   Cancelled Treatment:    Reason Eval/Treat Not Completed: Other (comment) (Evaluation re-attempted. Patient just received lunch tray and eating; RN at bedside.  Will re-attempt after lunch as patient available.)   Aceson Labell H. Manson PasseyBrown, PT, DPT, NCS 03/10/16, 12:57 PM [phone]

## 2016-03-10 NOTE — Progress Notes (Signed)
[facility] CLINIC INFECTIOUS DISEASE PROGRESS NOTE Date of Admission:  03/05/2016     ID: Molly [name] is a 70 y.o. female with fever, resp failure Active Problems:   Acute respiratory failure (HCC)   Subjective: No fevers, changed to augmentin and doxy.   ROS  Unable to obtain   Medications:  Antibiotics Given (last 72 hours)    Date/Time Action Medication Dose Rate   03/07/16 1715 Given   vancomycin (VANCOCIN) IVPB 750 mg/150 ml premix 750 mg 150 mL/hr   03/07/16 2125 Given   piperacillin-tazobactam (ZOSYN) IVPB 3.375 g 3.375 g 12.5 mL/hr   03/08/16 0503 Given   vancomycin (VANCOCIN) IVPB 750 mg/150 ml premix 750 mg 150 mL/hr   03/08/16 4098 Given   piperacillin-tazobactam (ZOSYN) IVPB 3.375 g 3.375 g 12.5 mL/hr   03/08/16 1438 Given   piperacillin-tazobactam (ZOSYN) IVPB 3.375 g 3.375 g 12.5 mL/hr   03/08/16 2002 Given   vancomycin (VANCOCIN) IVPB 750 mg/150 ml premix 750 mg 150 mL/hr   03/08/16 2140 Given   piperacillin-tazobactam (ZOSYN) IVPB 3.375 g 3.375 g 12.5 mL/hr   03/09/16 0532 Given   piperacillin-tazobactam (ZOSYN) IVPB 3.375 g 3.375 g 12.5 mL/hr   03/09/16 1191 Given   vancomycin (VANCOCIN) IVPB 750 mg/150 ml premix 750 mg 150 mL/hr   03/09/16 1414 Given   piperacillin-tazobactam (ZOSYN) IVPB 3.375 g 3.375 g 12.5 mL/hr   03/09/16 1709 Given   amoxicillin-clavulanate (AUGMENTIN) 875-125 MG per tablet 1 tablet 1 tablet    03/09/16 2116 Given   doxycycline (VIBRA-TABS) tablet 100 mg 100 mg    03/10/16 1215 Given   doxycycline (VIBRA-TABS) tablet 100 mg 100 mg    03/10/16 1216 Given   amoxicillin-clavulanate (AUGMENTIN) 875-125 MG per tablet 1 tablet 1 tablet      . amoxicillin-clavulanate  1 tablet Oral Q12H  . aspirin EC  81 mg Oral Daily  . atorvastatin  40 mg Oral q1800  . buPROPion  300 mg Oral Daily  . clopidogrel  75 mg Oral Daily  . diazepam  2.5 mg Oral BH-q7a  . doxycycline  100 mg Oral Q12H  . insulin aspart  0-20 Units Subcutaneous TID AC &  HS  . losartan  50 mg Oral Daily  . meclizine  25 mg Oral TID  . mupirocin ointment  1 application Nasal BID  . pantoprazole  40 mg Oral Daily  . polyethylene glycol  17 g Oral Daily  . QUEtiapine  25 mg Oral QHS  . senna  1 tablet Oral BID    Objective: Vital signs in last 24 hours: Temp:  [97.6 F (36.4 C)-98.6 F (37 C)] 98.1 F (36.7 C) (12/12 1158) Pulse Rate:  [88-96] 88 (12/12 1158) Resp:  [18-20] 18 (12/12 1158) BP: (138-159)/(65-88) 150/88 (12/12 1158) SpO2:  [90 %-94 %] 93 % (12/12 0730) Weight:  [101.2 kg (223 lb 1.6 oz)] 101.2 kg (223 lb 1.6 oz) (12/12 0500) Constitutional:  Morbidly obese, lying in bed, confused but unclear baseline HENT: [place]/AT, PERRLA, no scleral icterus Mouth/Throat: Oropharynx is clear and dry . No oropharyngeal exudate.  Cardiovascular: Normal rate, regular rhythm and distant heart sounds. Pulmonary/Chest: Effort normal and breath sounds normal.  Neck = supple, no nuchal rigidity Abdominal: Soft. Obese  Bowel sounds are normal.  exhibits no distension. There is no tenderness.  Lymphadenopathy: no cervical adenopathy. No axillary adenopathy Neurological: can tell me her name and dob. Minimally interactive.  Skin: Skin is warm and dry. L lateral foot with healed  wound, no warmth, drainage or tenderness Psychiatric: flat affect  Lab Results  Recent Labs  03/09/16 0312 03/10/16 0902  WBC 13.4* 15.9*  HGB 11.0* 12.1  HCT 33.0* 36.2  NA  --  134*  K  --  3.6  CL  --  96*  CO2  --  30  BUN  --  10  CREATININE 0.72 0.65    Microbiology: Results for orders placed or performed during the hospital encounter of 03/05/16  Culture, blood (Routine x 2)     Status: Molly [name]   Collection Time: 03/05/16  2:12 PM  Result Value Ref Range Status   Specimen Description BLOOD RIGHT AC  Final   Special Requests   Final    BOTTLES DRAWN AEROBIC AND ANAEROBIC AER ANA   Culture NO GROWTH 5 DAYS  Final   Report Status 03/10/2016 FINAL  Final   Urine culture     Status: Molly [name]   Collection Time: 03/05/16  2:12 PM  Result Value Ref Range Status   Specimen Description URINE, RANDOM  Final   Special Requests Molly [name]  Final   Culture NO GROWTH Performed at [facility]   Final   Report Status 03/06/2016 FINAL  Final  Culture, blood (Routine x 2)     Status: Abnormal   Collection Time: 03/05/16  2:50 PM  Result Value Ref Range Status   Specimen Description BLOOD LEFT AC  Final   Special Requests   Final    BOTTLES DRAWN AEROBIC AND ANAEROBIC AER ANA   Culture  Setup Time   Final    GRAM POSITIVE COCCI IN BOTH AEROBIC AND ANAEROBIC BOTTLES CRITICAL RESULT CALLED TO, READ BACK BY AND VERIFIED WITH: KAREN HAYES 03/06/16 1356 SGD    Culture (A)  Final    STAPHYLOCOCCUS SPECIES (COAGULASE NEGATIVE) THE SIGNIFICANCE OF ISOLATING THIS ORGANISM FROM A SINGLE SET OF BLOOD CULTURES WHEN MULTIPLE SETS ARE DRAWN IS UNCERTAIN. PLEASE NOTIFY THE MICROBIOLOGY DEPARTMENT WITHIN ONE WEEK IF SPECIATION AND SENSITIVITIES ARE REQUIRED. Performed at [facility]    Report Status 03/08/2016 FINAL  Final  Blood Culture ID Panel (Reflexed)     Status: Abnormal   Collection Time: 03/05/16  2:50 PM  Result Value Ref Range Status   Enterococcus species NOT DETECTED NOT DETECTED Final   Listeria monocytogenes NOT DETECTED NOT DETECTED Final   Staphylococcus species DETECTED (A) NOT DETECTED Final    Comment: CRITICAL RESULT CALLED TO, READ BACK BY AND VERIFIED WITH: KAREN HAYES 03/06/16 1356 SGD    Staphylococcus aureus NOT DETECTED NOT DETECTED Final   Methicillin resistance DETECTED (A) NOT DETECTED Final    Comment: CRITICAL RESULT CALLED TO, READ BACK BY AND VERIFIED WITH: KAREN HAYES 03/06/16 1356 SGD    Streptococcus species NOT DETECTED NOT DETECTED Final   Streptococcus agalactiae NOT DETECTED NOT DETECTED Final   Streptococcus pneumoniae NOT DETECTED NOT DETECTED Final   Streptococcus pyogenes NOT DETECTED NOT DETECTED  Final   Acinetobacter baumannii NOT DETECTED NOT DETECTED Final   Enterobacteriaceae species NOT DETECTED NOT DETECTED Final   Enterobacter cloacae complex NOT DETECTED NOT DETECTED Final   Escherichia coli NOT DETECTED NOT DETECTED Final   Klebsiella oxytoca NOT DETECTED NOT DETECTED Final   Klebsiella pneumoniae NOT DETECTED NOT DETECTED Final   Proteus species NOT DETECTED NOT DETECTED Final   Serratia marcescens NOT DETECTED NOT DETECTED Final   Haemophilus influenzae NOT DETECTED NOT DETECTED Final   Neisseria meningitidis NOT  DETECTED NOT DETECTED Final   Pseudomonas aeruginosa NOT DETECTED NOT DETECTED Final   Candida albicans NOT DETECTED NOT DETECTED Final   Candida glabrata NOT DETECTED NOT DETECTED Final   Candida krusei NOT DETECTED NOT DETECTED Final   Candida parapsilosis NOT DETECTED NOT DETECTED Final   Candida tropicalis NOT DETECTED NOT DETECTED Final  C difficile quick scan w PCR reflex     Status: Molly [name]   Collection Time: 03/05/16  4:29 PM  Result Value Ref Range Status   C Diff antigen NEGATIVE NEGATIVE Final   C Diff toxin NEGATIVE NEGATIVE Final   C Diff interpretation No C. difficile detected.  Final  MRSA PCR Screening     Status: Abnormal   Collection Time: 03/05/16  5:42 PM  Result Value Ref Range Status   MRSA by PCR POSITIVE (A) NEGATIVE Final    Comment:        The GeneXpert MRSA Assay (FDA approved for NASAL specimens only), is one component of a comprehensive MRSA colonization surveillance program. It is not intended to diagnose MRSA infection nor to guide or monitor treatment for MRSA infections. RESULT CALLED TO, READ BACK BY AND VERIFIED WITH: KELSEY BLACK 03/05/16 1915 KLW   Culture, blood (Routine X 2) w Reflex to ID Panel     Status: Molly [name] (Preliminary result)   Collection Time: 03/07/16 11:22 AM  Result Value Ref Range Status   Specimen Description BLOOD RIGHT ANTECUBITAL  Final   Special Requests BOTTLES DRAWN AEROBIC AND ANAEROBIC   10CC  Final   Culture NO GROWTH 3 DAYS  Final   Report Status PENDING  Incomplete  Culture, blood (Routine X 2) w Reflex to ID Panel     Status: Molly [name] (Preliminary result)   Collection Time: 03/07/16 11:22 AM  Result Value Ref Range Status   Specimen Description BLOOD LEFT HAND  Final   Special Requests   Final    BOTTLES DRAWN AEROBIC AND ANAEROBIC  AER 5CC ANA 6CC   Culture NO GROWTH 3 DAYS  Final   Report Status PENDING  Incomplete    Studies/Results: Dg Chest 1 View  Result Date: 03/08/2016 CLINICAL DATA:  Fever, diaphoresis home history COPD, hypertension, diabetes mellitus, stroke EXAM: CHEST 1 VIEW COMPARISON:  Portable exam 1517 hours compared to 03/07/2016 FINDINGS: Mild enlargement of cardiac silhouette. Mediastinal contours and pulmonary vascularity normal. Lungs clear. No pleural effusion or pneumothorax. No focal osseous abnormalities. IMPRESSION: No acute abnormalities. Mild enlargement of cardiac silhouette. Electronically Signed   By: Ulyses SouthwardMark  Boles M.D.   On: 03/08/2016 16:14   Ct Head Wo Contrast  Result Date: 03/10/2016 CLINICAL DATA:  Lethargy EXAM: CT HEAD WITHOUT CONTRAST TECHNIQUE: Contiguous axial images were obtained from the base of the skull through the vertex without intravenous contrast. COMPARISON:  MRI 02/06/2016 FINDINGS: Brain: Again noted are changes of infarction in the left frontal lobe and insular cortex, similar to prior MRI. Extensive chronic microvascular disease throughout the deep white matter. No new area of acute infarction. No hydrocephalus or hemorrhage. Vascular: No hyperdense vessel or unexpected calcification. Skull: No acute calvarial abnormality. Sinuses/Orbits: No acute findings. Other: Molly [name] IMPRESSION: Stable changes of infarction in the left frontal lobe and insular cortex. Extensive chronic small vessel disease. Electronically Signed   By: Charlett NoseKevin  Dover M.D.   On: 03/10/2016 11:50    Assessment/Plan: Ardis HughsBrenda P Mcclimans is a 70 y.o. female  admitted with sepsis, fever, leukocystosis and acute respiratory failure. History is limited but apparently vomited the day  prior. Imaging and cultures are negative. Improving with IV abx.  No obvious what caused this but I suspect given her condition and the vomiting that she had an aspiration event leading to resp failure.  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 The site does not appear actively infected.  The coag neg staph bacteremia I do not think is significant.  MRSA PCR is +.   Changed to oral augmentin and doxy 12/12. No fevers, seems to be working a little bit with PT  Recommendations Continue oral doxycyline and augmentin to complete a total 10 days of abx from time of admission Is she should worsen would need CT imaging of chest abd pelvis to rule out an abscess but would not suggest that at this point.  Thank you very much for the consult. Will follow with you.  Syna Gad P   03/10/2016, 1:55 PM

## 2016-03-10 NOTE — Evaluation (Signed)
Physical Therapy Evaluation Patient Details Name: Molly [name] MRN: [id number] DOB: [birth year] Today's Date: 03/10/2016   History of Present Illness  presented to ER from STR secondary to decreased responsiveness; admitted with respiratory failure, septic shock of unknown etiology, acute encephalopathy.  Initially requiring BiPAP and pressors for support; now on supplemental O2 via [place], pressors discontinued.  Clinical Impression  Upon evaluation, patient lethargic but awake and participatory.  Oriented to self and location, but intermittently confused with garbled, hypophonic speech (difficult to understand at times).  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  Requiring extensive physical assist for all functional activities: total assist +1-2 for bed mobility; max progressing to min assist for unsupported sitting balance (persistent R posterior/lateral lean with absent spontaneous righting) with balance therex. Unsafe/unable to attempt OOB at this time.   Very high risk for falls.  Would benefit from skilled PT to address above deficits and promote optimal return to PLOF; recommend transition to STR upon discharge from acute hospitalization.     Follow Up Recommendations SNF    Equipment Recommendations       Recommendations for Other Services       Precautions / Restrictions Precautions Precautions: Fall Precaution Comments: Contact isolation Restrictions Weight Bearing Restrictions: No      Mobility  Bed Mobility Overal bed mobility: Needs Assistance Bed Mobility: Supine to Sit;Sit to Supine     Supine to sit: Total assist Sit to supine: Total assist;+2 for physical assistance   General bed mobility comments: constant cuing/assist for position and protection of L UE  Transfers                 General transfer comment: unsafe/unable for OOB due to poor sitting balance  Ambulation/Gait             General Gait  Details: unsafe/unable for OOB due to poor sitting balance  Stairs            Wheelchair Mobility    Modified Rankin (Stroke Patients Only)       Balance Overall balance assessment: Needs assistance Sitting-balance support: No upper extremity supported;Feet supported Sitting balance-Leahy Scale: Poor Sitting balance - Comments: R posterior/lateral lean with minimal/no spontaneous righting reactions       Standing balance comment: unsafe/unable for OOB due to poor sitting balance                             Pertinent Vitals/Pain Pain Assessment: No/denies pain    Home Living Family/patient expects to be discharged to:: Skilled nursing facility                 Additional Comments: [facility] with PT/OT/ST, working towards scoot pivot transfers (WC level) with assist    Prior Function Level of Independence: Needs assistance         Comments: WC level as primary mobility (prior to Oct 2017), performing SPT between seating surfaces (sup level of assist).  Assist from husband for ADLs and household activities as needed     Hand Dominance   Dominant Hand: Right    Extremity/Trunk Assessment   Upper Extremity Assessment:  (L UE with 1-finger sublux, chronic hemplegia (1/5) with increased tone (2/4) proximal > distal; R UE grossly 4/5 throughout)           Lower Extremity Assessment:  (L LE grossly 3-/5 throughout with limited active ankle DF (-  10 degrees), decreased sensory awareness, chronic L lateral foot wound; R LE grossly 4/5 throughout)         Communication   Communication:  (speech garbled and hypophonic)  Cognition Arousal/Alertness: Lethargic Behavior During Therapy: Restless Overall Cognitive Status: Difficult to assess                 General Comments: oriented to self, follows simple commands with frequent redirection to task; poor awareness of situation    General Comments       Exercises Other Exercises Other Exercises: Unsupported sitting, max progressing to min assist--emphasis on R UE dynamic reaching (L ant/lateral) to promote midline orientation in A/P and M/L planes.  Constant faciltiation from therapist for balance; does improve with repetition, but unable to sustain with more progressive activity. Fatigues quickly.   Assessment/Plan    PT Assessment Patient needs continued PT services  PT Problem List Decreased strength;Decreased range of motion;Decreased balance;Decreased activity tolerance;Decreased mobility;Decreased cognition;Decreased knowledge of use of DME;Decreased safety awareness;Decreased knowledge of precautions;Decreased coordination;Cardiopulmonary status limiting activity;Obesity;Impaired sensation          PT Treatment Interventions DME instruction;Functional mobility training;Therapeutic activities;Therapeutic exercise;Balance training;Neuromuscular re-education;Patient/family education    PT Goals (Current goals can be found in the Care Plan section)  Acute Rehab PT Goals Patient Stated Goal: to return to rehab PT Goal Formulation: With family Time For Goal Achievement: 03/24/16 Potential to Achieve Goals: Fair Additional Goals Additional Goal #1: Assess and establish goals for OOB activities as appropriate.    Frequency Min 2X/week   Barriers to discharge Decreased caregiver support      Co-evaluation               End of Session Equipment Utilized During Treatment: Oxygen Activity Tolerance: Patient limited by fatigue Patient left: in bed;with call bell/phone within reach;with bed alarm set           Time: 1333-1400 PT Time Calculation (min) (ACUTE ONLY): 27 min   Charges:   PT Evaluation $PT Eval Moderate Complexity: 1 Procedure PT Treatments $Therapeutic Activity: 8-22 mins   PT G Codes:        Kemia Wendel H. Manson PasseyBrown, PT, DPT, NCS 03/10/16, 2:28 PM [phone]

## 2016-03-10 NOTE — Progress Notes (Signed)
Noted patient scratching vaginal area, this writer noted redness. SLP to eval, noted coughing with thin liquids. Not good historian.

## 2016-03-10 NOTE — Progress Notes (Signed)
Clinical Child psychotherapistocial Worker (CSW) [facility] to start [employer] auth. CSW also started [employer] auth by faxing clinicals to [facility].   [employer] Hughes [employer]Bailey Yonael Tulloch, LCSW [phone]

## 2016-03-11 ENCOUNTER — Inpatient Hospital Stay: Payer: Medicare HMO

## 2016-03-11 DIAGNOSIS — R569 Unspecified convulsions: Secondary | ICD-10-CM

## 2016-03-11 DIAGNOSIS — R252 Cramp and spasm: Secondary | ICD-10-CM

## 2016-03-11 DIAGNOSIS — R259 Unspecified abnormal involuntary movements: Secondary | ICD-10-CM

## 2016-03-11 LAB — GLUCOSE, CAPILLARY
GLUCOSE-CAPILLARY: 248 mg/dL — AB (ref 65–99)
Glucose-Capillary: 185 mg/dL — ABNORMAL HIGH (ref 65–99)
Glucose-Capillary: 172 mg/dL — ABNORMAL HIGH (ref 65–99)

## 2016-03-11 MED ORDER — ENOXAPARIN SODIUM 40 MG/0.4ML ~~LOC~~ SOLN
40.0000 mg | SUBCUTANEOUS | Status: DC
  Administered 2016-03-11: 40 mg via SUBCUTANEOUS
  Filled 2016-03-11: qty 0.4

## 2016-03-11 MED ORDER — DIAZEPAM 2 MG PO TABS
2.0000 mg | ORAL_TABLET | Freq: Every day | ORAL | Status: DC
  Administered 2016-03-11: 2 mg via ORAL
  Filled 2016-03-11: qty 1

## 2016-03-11 NOTE — Progress Notes (Signed)
[employer] authorization has been received. Auth # [id number] RUB. Per Gavin Pound[place]orah [facility] can return to facility when stable for D/C.   [employer]Bailey Jakie Debow, LCSW [phone]

## 2016-03-11 NOTE — Evaluation (Signed)
Occupational Therapy Evaluation Patient Details Name: Molly [name] MRN: [id number] DOB: 1945-04-03 Today's Date: 03/11/2016    History of Present Illness Pt. is a 70 y.o. female who [facility] with [facility] Failure, septic Shock, and acute encephalopathy.   Clinical Impression   Pt. Is a 70 y.o. Female who [facility] with [facility] failure, septic shock, and acute encephalopathy. Pt. presents with LUE weakness, decreased cognition, impaired functional mobility, and impaired vision which hinders her ability to complete ADL functioning. Pt. could benefit from skilled OT services for ADL training, neuromuscular re-ed, visual compensatory techniques, and pt. education about UE functioning, DME, and ADLs.    Follow Up Recommendations  SNF    Equipment Recommendations       Recommendations for Other Services       Precautions / Restrictions Precautions Precautions: Fall Restrictions Weight Bearing Restrictions: No                                                     ADL Overall ADL's : Needs assistance/impaired Eating/Feeding: Set up;Moderate assistance   Grooming: Moderate assistance           Upper Body Dressing : Total assistance   Lower Body Dressing: Total assistance                       Vision     Perception     Praxis      Pertinent Vitals/Pain Pain Assessment: 0-10 Pain Score: 0-No pain     Hand Dominance Right   Extremity/Trunk Assessment Upper Extremity Assessment Upper Extremity Assessment: Difficult to assess due to impaired cognition;LUE deficits/detail         Communication Communication Communication: Expressive difficulties   Cognition Arousal/Alertness: Awake/alert   Overall Cognitive Status: Difficult to assess                 General Comments: Pt. able to follow simple one step commands.   General Comments       Exercises       Shoulder Instructions       Home Living Family/patient expects to be discharged to:: Skilled nursing facility                                        Prior Functioning/Environment                   OT Problem List: Decreased strength;Decreased activity tolerance;Decreased range of motion;Impaired UE functional use;Decreased knowledge of use of DME or AE;Decreased coordination;Decreased safety awareness;Decreased cognition   OT Treatment/Interventions: Self-care/ADL training;Therapeutic exercise;DME and/or AE instruction;Therapeutic activities;Patient/family education;Manual therapy;Energy conservation    OT Goals(Current goals can be found in the care plan section) Acute Rehab OT Goals Patient Stated Goal: To return to rehab OT Goal Formulation: With patient Potential to Achieve Goals: Good  OT Frequency: Min 1X/week   Barriers to D/C:            Co-evaluation              End of Session    Activity Tolerance: Patient tolerated treatment well Patient left: in bed;with bed alarm set;with call bell/phone within reach   Time: 1310-1330 OT Time Calculation (  min): 20 min Charges:  OT General Charges $OT Visit: 1 Procedure OT Evaluation $OT Eval Moderate Complexity: 1 Procedure G-Codes:    Olegario MessierElaine Shann Lewellyn, MS, OTR/L 03/11/2016, 2:44 PM

## 2016-03-11 NOTE — Progress Notes (Signed)
Pt not in room. Pt at EEG. Will assess patient on return.

## 2016-03-11 NOTE — Evaluation (Signed)
Clinical/Bedside Swallow Evaluation Patient Details  Name: Molly [name] MRN: [id number] Date of Birth: 16-Aug-1945  Today's Date: 03/11/2016 Time: SLP Start Time (ACUTE ONLY): 1245 SLP Stop Time (ACUTE ONLY): 1335 SLP Time Calculation (min) (ACUTE ONLY): 50 min  Past Medical History:  Past Medical History:  Diagnosis Date  . Anxiety   . COPD (chronic obstructive pulmonary disease) (HCC)   . Diabetes (HCC)   . Hemorrhoid   . HLD (hyperlipidemia)   . HTN (hypertension)   . Stroke [facility](HCC) 2008   left weakness   Past Surgical History:  Past Surgical History:  Procedure Laterality Date  . AMPUTATION Left 05/22/2014   Procedure: AMPUTATION RAY LEFT FOURTH AND FIFTH ;  Surgeon: Toni ArthursJohn Hewitt, MD;  [facility];  Service: Orthopedics;  Laterality: Left;  . CATARACT EXTRACTION    . COLONOSCOPY  2004   HPI:      Assessment / Plan / Recommendation Clinical Impression  Pt appears to adequately tolerate trials of thin liquids via CUP and small, tsps of puree/mech soft foods w/ no overt s/s of aspiration noted; no change in vocal quality or respiratory status during/post trials - aspiration precautions and monitoring/support during eating/drinking was given. During the oral phase, pt exhibited min increased time for full mastication and oral clearing. Given time, she cleared appropriately. Pt was able to feed self w/ support d/t LUE weakness - able to hold the cup to drink. 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 Recommend a Dysphagia level 3 diet w/ thin liquids via CUP; no straws if any coughing is noted w/ use. Recommend assistance at meals; all Pills be given w/ a Puree for safer, easier swallowing vs w/ thin liquids (more cohesive w/ puree). Assist at meals.     Aspiration Risk   (reduced following aspiration precautions)    Diet Recommendation  Dysphagia level 3 w/ Thin liquids - NO straws if any coughing noted w/ use. Aspiration  precautions; Feeding support at meals.   Medication Administration: Whole meds with puree    Other  Recommendations Recommended Consults:  (Dietician) Oral Care Recommendations: Oral care BID;Staff/trained caregiver to provide oral care   Follow up Recommendations  (TBD)      Frequency and Duration min 2x/week  1 week       Prognosis Prognosis for Safe Diet Advancement: Fair (-Good) Barriers to Reach Goals: Cognitive deficits;Behavior      Swallow Study   General Date of Onset: 03/05/16 Type of Study: Bedside Swallow Evaluation Previous Swallow Assessment: none recently Diet Prior to this Study: Regular;Thin liquids ("may need soft foods") Temperature Spikes Noted: No (wbc elevated) Respiratory Status: Nasal cannula (2 liters. Was on BiPAP at admission.) History of Recent Intubation: No Behavior/Cognition: Alert;Cooperative;Pleasant mood;Confused;Distractible;Requires cueing Oral Cavity Assessment: Dry Oral Care Completed by SLP: Recent completion by staff Oral Cavity - Dentition: Missing dentition (few) Vision: Functional for self-feeding Self-Feeding Abilities: Able to feed self;Needs assist;Needs set up (helped to hold cup to drink) Patient Positioning: Upright in bed Baseline Vocal Quality: Low vocal intensity (mumbled speech; echolalia noted) Volitional Cough: Cognitively unable to elicit Volitional Swallow: Unable to elicit    Oral/Motor/Sensory Function Overall Oral Motor/Sensory Function: Within functional limits (grossly wfl)   Ice Chips Ice chips: Within functional limits Presentation: Spoon (fed; 2 trials)   Thin Liquid Thin Liquid: Within functional limits Presentation: Cup;Self Fed (assisted; ~4 ozs total) Other Comments: min disorganized when bringing cup to mouth to drink - Cognitive decline  Nectar Thick Nectar Thick Liquid: Not tested   Honey Thick Honey Thick Liquid: Not tested   Puree Puree: Within functional limits Presentation: Spoon (fed; 5  trials)   Solid   GO   Solid: Impaired Presentation: Spoon (fed; 2 trials) Oral Phase Impairments: Poor awareness of bolus;Impaired mastication (min) Oral Phase Functional Implications: Impaired mastication (prolonged) Pharyngeal Phase Impairments:  (none)         Jerilynn SomKatherine Watson, MS, CCC-SLP Watson,Katherine 03/11/2016,3:25 PM

## 2016-03-11 NOTE — Progress Notes (Signed)
Patient ID: Molly [name], female   DOB: 13-Feb-1946, 70 y.o.   MRN: [id number]  [facility] PROGRESS NOTE  Molly [name] WUJ:[id number] DOB: 04-Nov-1945 DOA: 03/05/2016 PCP: Elita Boone, MD  HPI/Subjective: Patient awakened from sleep and easily falls back to sleep. Patient was involuntarily moving her right leg. I was able to talk with her at the moment she was moving her right leg. I took off the SCDs to see if that made a difference but it didn't she kept on moving her right leg.  Objective: Vitals:   03/11/16 0534 03/11/16 0724  BP: 140/62 93/66  Pulse: 100 98  Resp: 18 18  Temp: 98.3 F (36.8 C)     Filed Weights   03/10/16 0500 03/11/16 0534 03/11/16 0800  Weight: 101.2 kg (223 lb 1.6 oz) 99.3 kg (218 lb 14.4 oz) 98.7 kg (217 lb 8 oz)    ROS: Review of Systems  Unable to perform ROS: Acuity of condition  Constitutional: Negative for fever.  Respiratory: Negative for cough and shortness of breath.   Cardiovascular: Negative for chest pain.  Gastrointestinal: Negative for abdominal pain.  Musculoskeletal: Negative for joint pain.   Exam: Physical Exam  HENT:  Nose: No mucosal edema.  Mouth/Throat: No oropharyngeal exudate or posterior oropharyngeal edema.  Eyes: Conjunctivae, EOM and lids are normal. Pupils are equal, round, and reactive to light.  Neck: No JVD present. Carotid bruit is not present. No edema present. Thyroid mass present. No thyromegaly present.  Cardiovascular: S1 normal and S2 normal.  Exam reveals no gallop.   No murmur heard. Pulses:      Dorsalis pedis pulses are 2+ on the right side, and 2+ on the left side.  Respiratory: No respiratory distress. She has no wheezes. She has no rhonchi. She has no rales.  GI: Soft. Bowel sounds are normal. There is no tenderness.  Musculoskeletal:       Right ankle: She exhibits swelling.       Left ankle: She exhibits swelling.  Lymphadenopathy:    She has no cervical adenopathy.   Neurological: She is alert.  Patient with involuntarily movement of her right lower extremity  Skin: Skin is warm. Nails show no clubbing.  Chronic upper extremity discoloration  Psychiatric: Her affect is blunt.      Data Reviewed: Basic Metabolic Panel:  Recent Labs Lab 03/05/16 1412 03/05/16 1855 03/06/16 0100 03/07/16 0510 03/09/16 0312 03/10/16 0902  NA 134* 136 136 140  --  134*  K 5.3* 5.1 4.6 4.2  --  3.6  CL 97* 103 106 108  --  96*  CO2 26 25 26 28   --  30  GLUCOSE 342* 303* 241* 116*  --  192*  BUN 42* 35* 36* 19  --  10  CREATININE 2.10* 1.83* 1.37* 0.88 0.72 0.65  CALCIUM 9.3 8.0* 7.8* 7.8*  --  9.0  MG  --   --  3.2* 2.0  --   --   PHOS  --   --  3.3  --   --   --    Liver Function Tests:  Recent Labs Lab 03/05/16 1412  AST 27  ALT 34  ALKPHOS 112  BILITOT 1.2  PROT 6.9  ALBUMIN 3.0*   CBC:  Recent Labs Lab 03/05/16 1412 03/06/16 0100 03/07/16 0510 03/09/16 0312 03/10/16 0902  WBC 29.9* 27.7* 18.8* 13.4* 15.9*  HGB 14.8 12.3 11.3* 11.0* 12.1  HCT 45.6 37.5 34.8* 33.0* 36.2  MCV  92.8 92.7 93.9 92.5 90.1  PLT 328 247 230 270 323    CBG:  Recent Labs Lab 03/10/16 1156 03/10/16 1702 03/10/16 2155 03/11/16 0726 03/11/16 1125  GLUCAP 172* 194* 186* 185* 172*    Recent Results (from the past 240 hour(s))  Culture, blood (Routine x 2)     Status: None   Collection Time: 03/05/16  2:12 PM  Result Value Ref Range Status   Specimen Description BLOOD RIGHT AC  Final   Special Requests   Final    BOTTLES DRAWN AEROBIC AND ANAEROBIC AER 7ML ANA 7ML   Culture NO GROWTH 5 DAYS  Final   Report Status 03/10/2016 FINAL  Final  Urine culture     Status: None   Collection Time: 03/05/16  2:12 PM  Result Value Ref Range Status   Specimen Description URINE, RANDOM  Final   Special Requests NONE  Final   Culture NO GROWTH Performed [facility]   Final   Report Status 03/06/2016 FINAL  Final  Culture, blood (Routine x 2)      Status: Abnormal   Collection Time: 03/05/16  2:50 PM  Result Value Ref Range Status   Specimen Description BLOOD LEFT AC  Final   Special Requests   Final    BOTTLES DRAWN AEROBIC AND ANAEROBIC AER 13ML ANA 12ML   Culture  Setup Time   Final    GRAM POSITIVE COCCI IN BOTH AEROBIC AND ANAEROBIC BOTTLES CRITICAL RESULT CALLED TO, READ BACK BY AND VERIFIED WITH: KAREN HAYES 03/06/16 1356 SGD    Culture (A)  Final    STAPHYLOCOCCUS SPECIES (COAGULASE NEGATIVE) THE SIGNIFICANCE OF ISOLATING THIS ORGANISM FROM A SINGLE SET OF BLOOD CULTURES WHEN MULTIPLE SETS ARE DRAWN IS UNCERTAIN. PLEASE NOTIFY THE MICROBIOLOGY DEPARTMENT WITHIN ONE WEEK IF SPECIATION AND SENSITIVITIES ARE REQUIRED. [facility]formed [facility]    Report Status 03/08/2016 FINAL  Final  Blood Culture ID Panel (Reflexed)     Status: Abnormal   Collection Time: 03/05/16  2:50 PM  Result Value Ref Range Status   Enterococcus species NOT DETECTED NOT DETECTED Final   Listeria monocytogenes NOT DETECTED NOT DETECTED Final   Staphylococcus species DETECTED (A) NOT DETECTED Final    Comment: CRITICAL RESULT CALLED TO, READ BACK BY AND VERIFIED WITH: KAREN HAYES 03/06/16 1356 SGD    Staphylococcus aureus NOT DETECTED NOT DETECTED Final   Methicillin resistance DETECTED (A) NOT DETECTED Final    Comment: CRITICAL RESULT CALLED TO, READ BACK BY AND VERIFIED WITH: KAREN HAYES 03/06/16 1356 SGD    Streptococcus species NOT DETECTED NOT DETECTED Final   Streptococcus agalactiae NOT DETECTED NOT DETECTED Final   Streptococcus pneumoniae NOT DETECTED NOT DETECTED Final   Streptococcus pyogenes NOT DETECTED NOT DETECTED Final   Acinetobacter baumannii NOT DETECTED NOT DETECTED Final   Enterobacteriaceae species NOT DETECTED NOT DETECTED Final   Enterobacter cloacae complex NOT DETECTED NOT DETECTED Final   Escherichia coli NOT DETECTED NOT DETECTED Final   Klebsiella oxytoca NOT DETECTED NOT DETECTED Final   Klebsiella  pneumoniae NOT DETECTED NOT DETECTED Final   Proteus species NOT DETECTED NOT DETECTED Final   Serratia marcescens NOT DETECTED NOT DETECTED Final   Haemophilus influenzae NOT DETECTED NOT DETECTED Final   Neisseria meningitidis NOT DETECTED NOT DETECTED Final   Pseudomonas aeruginosa NOT DETECTED NOT DETECTED Final   Candida albicans NOT DETECTED NOT DETECTED Final   Candida glabrata NOT DETECTED NOT DETECTED Final   Candida krusei  NOT DETECTED NOT DETECTED Final   Candida parapsilosis NOT DETECTED NOT DETECTED Final   Candida tropicalis NOT DETECTED NOT DETECTED Final  C difficile quick scan w PCR reflex     Status: None   Collection Time: 03/05/16  4:29 PM  Result Value Ref Range Status   C Diff antigen NEGATIVE NEGATIVE Final   C Diff toxin NEGATIVE NEGATIVE Final   C Diff interpretation No C. difficile detected.  Final  MRSA PCR Screening     Status: Abnormal   Collection Time: 03/05/16  5:42 PM  Result Value Ref Range Status   MRSA by PCR POSITIVE (A) NEGATIVE Final    Comment:        The GeneXpert MRSA Assay (FDA approved for NASAL specimens only), is one component of a comprehensive MRSA colonization surveillance program. It is not intended to diagnose MRSA infection nor to guide or monitor treatment for MRSA infections. RESULT CALLED TO, READ BACK BY AND VERIFIED WITH: KELSEY BLACK 03/05/16 1915 KLW   Culture, blood (Routine X 2) w Reflex to ID Panel     Status: None (Preliminary result)   Collection Time: 03/07/16 11:22 AM  Result Value Ref Range Status   Specimen Description BLOOD RIGHT ANTECUBITAL  Final   Special Requests BOTTLES DRAWN AEROBIC AND ANAEROBIC  10CC  Final   Culture NO GROWTH 4 DAYS  Final   Report Status PENDING  Incomplete  Culture, blood (Routine X 2) w Reflex to ID Panel     Status: None (Preliminary result)   Collection Time: 03/07/16 11:22 AM  Result Value Ref Range Status   Specimen Description BLOOD LEFT HAND  Final   Special Requests    Final    BOTTLES DRAWN AEROBIC AND ANAEROBIC  AER 5CC ANA 6CC   Culture NO GROWTH 4 DAYS  Final   Report Status PENDING  Incomplete     Studies: Ct Head Wo Contrast  Result Date: 03/10/2016 CLINICAL DATA:  Lethargy EXAM: CT HEAD WITHOUT CONTRAST TECHNIQUE: Contiguous axial images were obtained from the base of the skull through the vertex without intravenous contrast. COMPARISON:  MRI 02/06/2016 FINDINGS: Brain: Again noted are changes of infarction in the left frontal lobe and insular cortex, similar to prior MRI. Extensive chronic microvascular disease throughout the deep white matter. No new area of acute infarction. No hydrocephalus or hemorrhage. Vascular: No hyperdense vessel or unexpected calcification. Skull: No acute calvarial abnormality. Sinuses/Orbits: No acute findings. Other: None IMPRESSION: Stable changes of infarction in the left frontal lobe and insular cortex. Extensive chronic small vessel disease. Electronically Signed   By: Charlett NoseKevin  Dover M.D.   On: 03/10/2016 11:50    Scheduled Meds: . amoxicillin-clavulanate  1 tablet Oral Q12H  . aspirin EC  81 mg Oral Daily  . atorvastatin  40 mg Oral q1800  . clopidogrel  75 mg Oral Daily  . diazepam  2 mg Oral Daily  . doxycycline  100 mg Oral Q12H  . enoxaparin (LOVENOX) injection  40 mg Subcutaneous Q24H  . insulin aspart  0-20 Units Subcutaneous TID AC & HS  . pantoprazole  40 mg Oral Daily    Assessment/Plan:  1. Involuntarily moving the right leg, lethargy yesterday. Patient answering questions today. Called neurology to evaluate for possible focal seizure. EEG ordered. Wellbutrin stopped. Valium reordered. 2. Acute respiratory with hypoxia. Patient initially required BiPAP and was admitted to the critical care service. Patient's breathing comfortably at this point. 3. Fever, tachycardia and leukocytosis. This has improved.  Patient also had lactic acidosis and high pro-calcitonin. Finish course of doxycycline and Augmentin  for total of 10 days. 4. Hypotension on presentation. This has resolved with IV fluid hydration 5. Dehydration and acute kidney injury. This has resolved with IV fluid hydration. 6. Thyroid mass. Was set up for an outpatient biopsy this Friday. Thyroid function tests normal. This will have to be rescheduled. 7. History of stroke with left-sided weakness on aspirin and Plavix 8. Hyperlipidemia unspecified on atorvastatin 9. History of COPD 10. History of anxiety on Valium. Wellbutrin stopped 11. Type 2 diabetes mellitus. On sliding scale for right now. Hold Lantus  Code Status:     Code Status Orders        Start     Ordered   03/05/16 1548  Full code  Continuous     03/05/16 1549    Code Status History    Date Active Date Inactive Code Status Order ID Comments User Context   02/06/2016  3:32 PM 02/11/2016  7:25 PM Full Code [id number]  Wyatt Haste, MD ED   02/01/2016  9:17 PM 02/05/2016  1:59 PM Full Code [id number]  Auburn Bilberry, MD Inpatient   01/14/2016  2:01 PM 01/16/2016 10:22 PM Full Code [id number]  Katha Hamming, MD ED   11/19/2014 10:40 PM 11/21/2014  8:55 PM Full Code [id number]  Oralia Manis, MD Inpatient   05/22/2014  6:31 PM 05/25/2014  8:20 PM Full Code [id number]  Toni Arthurs, MD Inpatient   05/21/2014 10:31 PM 05/22/2014  6:31 PM Full Code [id number]  Yevonne Pax, MD Inpatient     Family Communication: Spoke with husband. Disposition Plan: Needs prior authorization to go back to rehabilitation.   Consultants:  Infectious disease  Neurology  Antibiotics:  Doxycycline  Augmentin  Time spent: 24 minutes.  Alford Highland  [employer]

## 2016-03-11 NOTE — Progress Notes (Signed)
Anticoagulation monitoring(Lovenox):  70yo  female ordered Lovenox 30 mg Q24h  Filed Weights   03/10/16 0500 03/11/16 0534 03/11/16 0800  Weight: 223 lb 1.6 oz (101.2 kg) 218 lb 14.4 oz (99.3 kg) 217 lb 8 oz (98.7 kg)   BMI 34.2   Lab Results  Component Value Date   CREATININE 0.65 03/10/2016   CREATININE 0.72 03/09/2016   CREATININE 0.88 03/07/2016   Estimated Creatinine Clearance: 78.9 mL/min (by C-G formula based on SCr of 0.65 mg/dL). Hemoglobin & Hematocrit     Component Value Date/Time   HGB 12.1 03/10/2016 0902   HCT 36.2 03/10/2016 0902     Per Protocol for Patient with estCrcl > 30 ml/min and BMI < 40, will transition to Lovenox 40 mg Q24h.

## 2016-03-11 NOTE — Consult Note (Signed)
Reason for Consult:RLE twitching Referring Physician: Renae GlossWieting  CC: RLE twitching  HPI: Molly [name] is an 70 y.o. female with a history of stroke with resultant left sided weakness and recent left frontal infarct who presented unresponsive on 12/7 and felt to be septic.  Patient now improved but felt to have intermittent right leg jerking, ? Clonic activity.  Consult called for further recommendations.    Past Medical History:  Diagnosis Date  . Anxiety   . COPD (chronic obstructive pulmonary disease) (HCC)   . Diabetes (HCC)   . Hemorrhoid   . HLD (hyperlipidemia)   . HTN (hypertension)   . Stroke [facility](HCC) 2008   left weakness    Past Surgical History:  Procedure Laterality Date  . AMPUTATION Left 05/22/2014   Procedure: AMPUTATION RAY LEFT FOURTH AND FIFTH ;  Surgeon: Toni ArthursJohn Hewitt, MD;  [facility];  Service: Orthopedics;  Laterality: Left;  . CATARACT EXTRACTION    . COLONOSCOPY  2004    Family History  Problem Relation Age of Onset  . Hyperlipidemia Mother   . Varicose Veins Mother   . Deep vein thrombosis Father   . Stroke Father   . Breast cancer Paternal Aunt   . Stroke Maternal Grandmother   . Stroke Paternal Grandmother     Social History:  reports that she quit smoking about 47 years ago. Her smoking use included Cigarettes. She has a 1.00 pack-year smoking history. She has never used smokeless tobacco. She reports that she does not drink alcohol or use drugs.  No Known Allergies  Medications:  I have reviewed the patient's current medications. Prior to Admission:  Prescriptions Prior to Admission  Medication Sig Dispense Refill Last Dose  . acetaminophen (TYLENOL) 325 MG tablet Take 650 mg by mouth every 6 (six) hours as needed.   prn at prn  . amitriptyline (ELAVIL) 50 MG tablet Take 50 mg by mouth at bedtime.    03/04/2016 at 1919  . Ascorbic Acid (VITAMIN C) 1000 MG tablet Take 1,000 mg by mouth daily.   03/05/2016 at 0829  . aspirin EC 81 MG tablet  Take 81 mg by mouth daily.   03/05/2016 at 0829  . atorvastatin (LIPITOR) 40 MG tablet Take 1 tablet (40 mg total) by mouth daily at 6 PM. 30 tablet 2 03/04/2016 at 1745  . B Complex-C (SUPER B COMPLEX PO) Take 1 tablet by mouth daily.    03/05/2016 at 0829  . bisacodyl (DULCOLAX) 5 MG EC tablet Take 1 tablet (5 mg total) by mouth daily as needed for moderate constipation. 30 tablet 0 prn at prn  . buPROPion (WELLBUTRIN XL) 300 MG 24 hr tablet Take 300 mg by mouth daily.   03/04/2016 at 1609  . calcium carbonate (OS-CAL) 600 MG TABS tablet Take 600 mg by mouth daily.   03/05/2016 at 0829  . Cholecalciferol (VITAMIN D3) 1000 UNITS CAPS Take 1,000 Units by mouth daily.   03/05/2016 at 0829  . clopidogrel (PLAVIX) 75 MG tablet Take 75 mg by mouth daily.   03/05/2016 at 0829  . Coenzyme Q10 (COQ10 PO) Take 1 capsule by mouth daily.    03/05/2016 at 0829  . diazepam (VALIUM) 5 MG tablet Take 2.5 mg by mouth every morning.   03/05/2016 at 0829  . GLUCOSAMINE-CHONDROITIN PO Take 1 tablet by mouth 2 (two) times daily.    03/05/2016 at 0829  . insulin detemir (LEVEMIR) 100 UNIT/ML injection Inject 0.28 mLs (28 Units total) into the skin  daily. 10 mL 11 03/04/2016 at 2153  . insulin lispro (HUMALOG) 100 UNIT/ML injection Inject into the skin once. Sliding scale 0-99=0 units 100-149=2 units 150-199=3 units 200-249=4 units 250-299=5 units 300-349=6 units 350-399= 7 units 400-449=8 units   03/05/2016 at 1244  . losartan (COZAAR) 50 MG tablet Take 50 mg by mouth daily.   03/05/2016 at 0829  . magnesium oxide (MAG-OX) 400 MG tablet Take 400 mg by mouth 2 (two) times daily.   03/05/2016 at 0829  . meclizine (ANTIVERT) 25 MG tablet Take 1 tablet (25 mg total) by mouth 3 (three) times daily. 30 tablet 0 prn at prn  . Melatonin 10 MG TBDP Take 10 mg by mouth at bedtime.   03/04/2016 at 1919  . Multiple Vitamins-Minerals (OCUVITE PO) Take 1 capsule by mouth daily.    03/05/2016 at 0829  . Omega-3 Fatty Acids (FISH OIL) 1000 MG  CPDR Take 1,200 mg by mouth daily.   03/05/2016 at 0829  . omeprazole (PRILOSEC) 20 MG capsule Take 20 mg by mouth daily.   03/05/2016 at 0514  . polyethylene glycol (MIRALAX / GLYCOLAX) packet Take 17 g by mouth daily.   03/05/2016 at 0829  . senna (SENOKOT) 8.6 MG TABS tablet Take 1 tablet by mouth 2 (two) times daily.   03/05/2016 at 0829  . triamcinolone cream (KENALOG) 0.1 % Apply 1 application topically 2 (two) times daily.   03/04/2016 at 1802  . vitamin E 400 UNIT capsule Take 400 Units by mouth daily.   03/05/2016 at 0829   Scheduled: . amoxicillin-clavulanate  1 tablet Oral Q12H  . aspirin EC  81 mg Oral Daily  . atorvastatin  40 mg Oral q1800  . buPROPion  300 mg Oral Daily  . clopidogrel  75 mg Oral Daily  . doxycycline  100 mg Oral Q12H  . enoxaparin (LOVENOX) injection  40 mg Subcutaneous Q24H  . insulin aspart  0-20 Units Subcutaneous TID AC & HS  . pantoprazole  40 mg Oral Daily    ROS: History obtained from husband  General ROS: negative for - chills, fatigue, fever, night sweats, weight gain or weight loss Psychological ROS: negative for - behavioral disorder, hallucinations, memory difficulties, mood swings or suicidal ideation Ophthalmic ROS: negative for - blurry vision, double vision, eye pain or loss of vision ENT ROS: negative for - epistaxis, nasal discharge, oral lesions, sore throat, tinnitus or vertigo Allergy and Immunology ROS: negative for - hives or itchy/watery eyes Hematological and Lymphatic ROS: negative for - bleeding problems, bruising or swollen lymph nodes Endocrine ROS: negative for - galactorrhea, hair pattern changes, polydipsia/polyuria or temperature intolerance Respiratory ROS: negative for - cough, hemoptysis, shortness of breath or wheezing Cardiovascular ROS: negative for - chest pain, dyspnea on exertion, edema or irregular heartbeat Gastrointestinal ROS: negative for - abdominal pain, diarrhea, hematemesis, nausea/vomiting or stool  incontinence Genito-Urinary ROS: negative for - dysuria, hematuria, incontinence or urinary frequency/urgency Musculoskeletal ROS: negative for - joint swelling or muscular weakness Neurological ROS: as noted in HPI Dermatological ROS: negative for rash and skin lesion changes  Physical Examination: Blood pressure 93/66, pulse 98, temperature 98.3 F (36.8 C), temperature source Oral, resp. rate 18, height 5\' 7"  (1.702 m), weight 98.7 kg (217 lb 8 oz), SpO2 94 %.  HEENT-  Normocephalic, no lesions, without obvious abnormality.  Normal external eye and conjunctiva.  Normal TM's bilaterally.  Normal auditory canals and external ears. Normal external nose, mucus membranes and septum.  Normal pharynx. Cardiovascular- S1,  S2 normal, pulses palpable throughout   Lungs- chest clear, no wheezing, rales, normal symmetric air entry Abdomen- soft, non-tender; bowel sounds normal; no masses,  no organomegaly Extremities- LE edema Lymph-no adenopathy palpable Musculoskeletal-no joint tenderness, deformity or swelling Skin-warm and dry, no hyperpigmentation, vitiligo, or suspicious lesions  Neurological Examination Mental Status: Alert, thought content appropriate.  Minimal speech.  Able to follow 3 step commands without difficulty. Cranial Nerves: II: Discs flat bilaterally; Visual fields grossly normal, pupils equal, round, reactive to light and accommodation III,IV, VI: ptosis not present, extra-ocular motions intact bilaterally V,VII: decrease in right NLF, facial light touch sensation normal bilaterally VIII: hearing normal bilaterally IX,X: gag reflex present XI: bilateral shoulder shrug XII: midline tongue extension Motor: Right :  Upper extremity   5/5                                      Left:     Upper extremity   0/5             Lower extremity   3/5                                                  Lower extremity   3/5 Tone and bulk:normal tone throughout; no atrophy noted Sensory:  Pinprick and light touch intact throughout, bilaterally Deep Tendon Reflexes: 2+ in the upper extremities, trace at the knee and absent at the ankles Plantars: Right: mute                              Left: mute Cerebellar: Normal finger-to-nose testing with the RUE Gait: not tested due to safety concerns    Laboratory Studies:   Basic Metabolic Panel:  Recent Labs Lab 03/05/16 1412 03/05/16 1855 03/06/16 0100 03/07/16 0510 03/09/16 0312 03/10/16 0902  NA 134* 136 136 140  --  134*  K 5.3* 5.1 4.6 4.2  --  3.6  CL 97* 103 106 108  --  96*  CO2 26 25 26 28   --  30  GLUCOSE 342* 303* 241* 116*  --  192*  BUN 42* 35* 36* 19  --  10  CREATININE 2.10* 1.83* 1.37* 0.88 0.72 0.65  CALCIUM 9.3 8.0* 7.8* 7.8*  --  9.0  MG  --   --  3.2* 2.0  --   --   PHOS  --   --  3.3  --   --   --     Liver Function Tests:  Recent Labs Lab 03/05/16 1412  AST 27  ALT 34  ALKPHOS 112  BILITOT 1.2  PROT 6.9  ALBUMIN 3.0*   No results for input(s): LIPASE, AMYLASE in the last 168 hours.  Recent Labs Lab 03/10/16 0902  AMMONIA 21    CBC:  Recent Labs Lab 03/05/16 1412 03/06/16 0100 03/07/16 0510 03/09/16 0312 03/10/16 0902  WBC 29.9* 27.7* 18.8* 13.4* 15.9*  HGB 14.8 12.3 11.3* 11.0* 12.1  HCT 45.6 37.5 34.8* 33.0* 36.2  MCV 92.8 92.7 93.9 92.5 90.1  PLT 328 247 230 270 323    Cardiac Enzymes: No results for input(s): CKTOTAL, CKMB, CKMBINDEX, TROPONINI in the last 168 hours.  BNP: Invalid input(s): POCBNP  CBG:  Recent Labs Lab 03/10/16 1156 03/10/16 1702 03/10/16 2155 03/11/16 0726 03/11/16 1125  GLUCAP 172* 194* 186* 185* 172*    Microbiology: Results for orders placed or performed during the hospital encounter of 03/05/16  Culture, blood (Routine x 2)     Status: None   Collection Time: 03/05/16  2:12 PM  Result Value Ref Range Status   Specimen Description BLOOD RIGHT AC  Final   Special Requests   Final    BOTTLES DRAWN AEROBIC AND ANAEROBIC AER  ANA   Culture NO GROWTH 5 DAYS  Final   Report Status 03/10/2016 FINAL  Final  Urine culture     Status: None   Collection Time: 03/05/16  2:12 PM  Result Value Ref Range Status   Specimen Description URINE, RANDOM  Final   Special Requests NONE  Final   Culture NO GROWTH Performed at [facility]   Final   Report Status 03/06/2016 FINAL  Final  Culture, blood (Routine x 2)     Status: Abnormal   Collection Time: 03/05/16  2:50 PM  Result Value Ref Range Status   Specimen Description BLOOD LEFT AC  Final   Special Requests   Final    BOTTLES DRAWN AEROBIC AND ANAEROBIC AER ANA   Culture  Setup Time   Final    GRAM POSITIVE COCCI IN BOTH AEROBIC AND ANAEROBIC BOTTLES CRITICAL RESULT CALLED TO, READ BACK BY AND VERIFIED WITH: KAREN HAYES 03/06/16 1356 SGD    Culture (A)  Final    STAPHYLOCOCCUS SPECIES (COAGULASE NEGATIVE) THE SIGNIFICANCE OF ISOLATING THIS ORGANISM FROM A SINGLE SET OF BLOOD CULTURES WHEN MULTIPLE SETS ARE DRAWN IS UNCERTAIN. PLEASE NOTIFY THE MICROBIOLOGY DEPARTMENT WITHIN ONE WEEK IF SPECIATION AND SENSITIVITIES ARE REQUIRED. Performed at [facility]    Report Status 03/08/2016 FINAL  Final  Blood Culture ID Panel (Reflexed)     Status: Abnormal   Collection Time: 03/05/16  2:50 PM  Result Value Ref Range Status   Enterococcus species NOT DETECTED NOT DETECTED Final   Listeria monocytogenes NOT DETECTED NOT DETECTED Final   Staphylococcus species DETECTED (A) NOT DETECTED Final    Comment: CRITICAL RESULT CALLED TO, READ BACK BY AND VERIFIED WITH: KAREN HAYES 03/06/16 1356 SGD    Staphylococcus aureus NOT DETECTED NOT DETECTED Final   Methicillin resistance DETECTED (A) NOT DETECTED Final    Comment: CRITICAL RESULT CALLED TO, READ BACK BY AND VERIFIED WITH: KAREN HAYES 03/06/16 1356 SGD    Streptococcus species NOT DETECTED NOT DETECTED Final   Streptococcus agalactiae NOT DETECTED NOT DETECTED Final   Streptococcus  pneumoniae NOT DETECTED NOT DETECTED Final   Streptococcus pyogenes NOT DETECTED NOT DETECTED Final   Acinetobacter baumannii NOT DETECTED NOT DETECTED Final   Enterobacteriaceae species NOT DETECTED NOT DETECTED Final   Enterobacter cloacae complex NOT DETECTED NOT DETECTED Final   Escherichia coli NOT DETECTED NOT DETECTED Final   Klebsiella oxytoca NOT DETECTED NOT DETECTED Final   Klebsiella pneumoniae NOT DETECTED NOT DETECTED Final   Proteus species NOT DETECTED NOT DETECTED Final   Serratia marcescens NOT DETECTED NOT DETECTED Final   Haemophilus influenzae NOT DETECTED NOT DETECTED Final   Neisseria meningitidis NOT DETECTED NOT DETECTED Final   Pseudomonas aeruginosa NOT DETECTED NOT DETECTED Final   Candida albicans NOT DETECTED NOT DETECTED Final   Candida glabrata NOT DETECTED NOT DETECTED Final   Candida krusei NOT DETECTED NOT DETECTED Final   Candida parapsilosis  NOT DETECTED NOT DETECTED Final   Candida tropicalis NOT DETECTED NOT DETECTED Final  C difficile quick scan w PCR reflex     Status: None   Collection Time: 03/05/16  4:29 PM  Result Value Ref Range Status   C Diff antigen NEGATIVE NEGATIVE Final   C Diff toxin NEGATIVE NEGATIVE Final   C Diff interpretation No C. difficile detected.  Final  MRSA PCR Screening     Status: Abnormal   Collection Time: 03/05/16  5:42 PM  Result Value Ref Range Status   MRSA by PCR POSITIVE (A) NEGATIVE Final    Comment:        The GeneXpert MRSA Assay (FDA approved for NASAL specimens only), is one component of a comprehensive MRSA colonization surveillance program. It is not intended to diagnose MRSA infection nor to guide or monitor treatment for MRSA infections. RESULT CALLED TO, READ BACK BY AND VERIFIED WITH: KELSEY BLACK 03/05/16 1915 KLW   Culture, blood (Routine X 2) w Reflex to ID Panel     Status: None (Preliminary result)   Collection Time: 03/07/16 11:22 AM  Result Value Ref Range Status   Specimen  Description BLOOD RIGHT ANTECUBITAL  Final   Special Requests BOTTLES DRAWN AEROBIC AND ANAEROBIC  10CC  Final   Culture NO GROWTH 4 DAYS  Final   Report Status PENDING  Incomplete  Culture, blood (Routine X 2) w Reflex to ID Panel     Status: None (Preliminary result)   Collection Time: 03/07/16 11:22 AM  Result Value Ref Range Status   Specimen Description BLOOD LEFT HAND  Final   Special Requests   Final    BOTTLES DRAWN AEROBIC AND ANAEROBIC  AER 5CC ANA 6CC   Culture NO GROWTH 4 DAYS  Final   Report Status PENDING  Incomplete    Coagulation Studies: No results for input(s): LABPROT, INR in the last 72 hours.  Urinalysis:  Recent Labs Lab 03/05/16 1412  COLORURINE AMBER*  LABSPEC 1.021  PHURINE 5.0  GLUCOSEU 150*  HGBUR NEGATIVE  BILIRUBINUR NEGATIVE  KETONESUR 5*  PROTEINUR 100*  NITRITE NEGATIVE  LEUKOCYTESUR NEGATIVE    Lipid Panel:     Component Value Date/Time   CHOL 150 02/02/2016 0501   TRIG 196 (H) 02/02/2016 0501   HDL 30 (L) 02/02/2016 0501   CHOLHDL 5.0 02/02/2016 0501   VLDL 39 02/02/2016 0501   LDLCALC 81 02/02/2016 0501    HgbA1C:  Lab Results  Component Value Date   HGBA1C 6.7 (H) 02/02/2016    Urine Drug Screen:     Component Value Date/Time   LABOPIA NONE DETECTED 02/06/2016 1336   LABOPIA NONE DETECTED 10/15/2006 1244   COCAINSCRNUR NONE DETECTED 02/06/2016 1336   LABBENZ NONE DETECTED 02/06/2016 1336   LABBENZ NONE DETECTED 10/15/2006 1244   AMPHETMU NONE DETECTED 02/06/2016 1336   AMPHETMU NONE DETECTED 10/15/2006 1244   THCU NONE DETECTED 02/06/2016 1336   THCU NONE DETECTED 10/15/2006 1244   LABBARB NONE DETECTED 02/06/2016 1336   LABBARB  10/15/2006 1244    NONE DETECTED        DRUG SCREEN FOR MEDICAL PURPOSES ONLY.  IF CONFIRMATION IS NEEDED FOR ANY PURPOSE, NOTIFY LAB WITHIN 5 DAYS.    Alcohol Level: No results for input(s): ETH in the last 168 hours.  Other results: EKG: 98 bpm.  Imaging: Ct Head Wo  Contrast  Result Date: 03/10/2016 CLINICAL DATA:  Lethargy EXAM: CT HEAD WITHOUT CONTRAST TECHNIQUE: Contiguous axial images were  obtained from the base of the skull through the vertex without intravenous contrast. COMPARISON:  MRI 02/06/2016 FINDINGS: Brain: Again noted are changes of infarction in the left frontal lobe and insular cortex, similar to prior MRI. Extensive chronic microvascular disease throughout the deep white matter. No new area of acute infarction. No hydrocephalus or hemorrhage. Vascular: No hyperdense vessel or unexpected calcification. Skull: No acute calvarial abnormality. Sinuses/Orbits: No acute findings. Other: None IMPRESSION: Stable changes of infarction in the left frontal lobe and insular cortex. Extensive chronic small vessel disease. Electronically Signed   By: Charlett Nose M.D.   On: 03/10/2016 11:50     Assessment/Plan: 70 year old female with reported RLE twitching that is intermittent.  History of left frontal infarct.  Can not rule out seizure.  Patient on Valium as an outpatient that has been discontinued.  Is on Wellbutrin as well.  These may be further lowering her seizure threshold.    Recommendations: 1.  Seizure precautions 2.  EEG 3.  D/C Wellbutrin 4.  Restart Valium at 2.5mg  daily  Thana Farr, MD Neurology [phone] 03/11/2016, 11:58 AM

## 2016-03-11 NOTE — Progress Notes (Signed)
PT Cancellation Note  Patient Details Name: Molly HughsBrenda P Todorov MRN: [id number] DOB: 02/09/1946   Cancelled Treatment:    Reason Eval/Treat Not Completed: Patient at procedure or test/unavailable.  Will attempt to see later if time and pt allow.   Ivar DrapeStout, Reynald Woods E 03/11/2016, 3:35 PM    Samul Dadauth Jamarii Banks, PT MS Acute Rehab Dept. Number: [facility] [phone] and [facility] [phone]

## 2016-03-12 LAB — GLUCOSE, CAPILLARY
GLUCOSE-CAPILLARY: 204 mg/dL — AB (ref 65–99)
GLUCOSE-CAPILLARY: 290 mg/dL — AB (ref 65–99)

## 2016-03-12 LAB — CULTURE, BLOOD (ROUTINE X 2)
CULTURE: NO GROWTH
Culture: NO GROWTH

## 2016-03-12 MED ORDER — INSULIN DETEMIR 100 UNIT/ML ~~LOC~~ SOLN: 10.0000 [IU] | Freq: Every day | SUBCUTANEOUS | 0 refills | Status: DC

## 2016-03-12 MED ORDER — DOXYCYCLINE HYCLATE 100 MG PO TABS: 100.0000 mg | ORAL_TABLET | Freq: Two times a day (BID) | ORAL | 0 refills | Status: AC

## 2016-03-12 MED ORDER — AMOXICILLIN-POT CLAVULANATE 875-125 MG PO TABS: 1.0000 | ORAL_TABLET | Freq: Two times a day (BID) | ORAL | 0 refills | Status: AC

## 2016-03-12 MED ORDER — DIAZEPAM 2 MG PO TABS: 2.0000 mg | ORAL_TABLET | Freq: Every day | ORAL | 0 refills | Status: DC

## 2016-03-12 NOTE — Care Management Important Message (Signed)
Important Message  Patient Details  Name: Molly [name] MRN: [id number] Date of Birth: 01/02/1946   Medicare Important Message Given:  Yes    Marily MemosLisa M Salik Grewell, RN 03/12/2016, 11:15 AM

## 2016-03-12 NOTE — Progress Notes (Signed)
Patient is medically stable for [facility]. [employer] authorization has been received. Per Gavin Poundeborah admissions coordinator [facility] can [facility] today to room 304. RN will call report to [facility] and arrange EMS for transport. Clinical Child psychotherapistocial Worker (CSW) sent D/C orders to [employer] via [employer]. Patient is aware of above. Patient's husband Ronne BinningFrio is at bedside and aware of above. Please reconsult if future social work needs arise. CSW signing off.   [employer]iley Donevan Biller, LCSW [phone]

## 2016-03-12 NOTE — Discharge Summary (Addendum)
[facility] [place] at [facility]   PATIENT NAME: Molly DustmanBrenda [name]    MR#:  [id number]  DATE OF BIRTH:  01/01/1946  DATE OF ADMISSION:  03/05/2016 ADMITTING PHYSICIAN: Erin FullingKurian Kasa, MD  DATE OF DISCHARGE: 03/12/2016  PRIMARY CARE PHYSICIAN: Elita Booneoberts, Caroline C, MD    ADMISSION DIAGNOSIS:  Sepsis, due to unspecified organism [facility](HCC) [A41.9] Acute renal failure, unspecified acute renal failure type (HCC) [N17.9] Altered mental status, unspecified altered mental status type [R41.82]  DISCHARGE DIAGNOSIS:  Active Problems:   Acute respiratory failure (HCC)   Involuntary movements   SECONDARY DIAGNOSIS:   Past Medical History:  Diagnosis Date  . Anxiety   . COPD (chronic obstructive pulmonary disease) (HCC)   . Diabetes (HCC)   . Hemorrhoid   . HLD (hyperlipidemia)   . HTN (hypertension)   . Stroke [facility](HCC) 2008   left weakness    HOSPITAL COURSE:   70 yo female with a PMH of CVA, HTN, Hyperlipidemia, Hemorrhoids, Obesity,Type II DM, COPD, and Anxiety.  [facility] minimally responsive. Upon arrival to the ER she was minimally responsive and hypotensive she was placed on Bipap and given a total of 4L NS boluses for fluid resuscitation, however she remained hypotensive requiring levophed gtt. PCCM contacted to admit pt to ICU due to acute hypoxic hypercapnic respiratory failure requiring Bipap, hypotension secondary to septic shock unclear etiology, and acute encephalopathy secondary to sepsis.   1. Acute hypoxic hypercapnic respiratory failure; She was initally admitted to CCU where she was on Bipap and now has improved/  2.Septic Shock which has now resolved No obvious what caused this but I suspect given her condition and the vomiting that she had an aspiration event leading to resp failure.  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 The site does not  appear actively infected.  The coag neg staph bacteremia was not significant.  She was on IV vanco and zosyn for 5 days and recommendations by Id were to change to Po Augmentin and doxycycline for 10 days total.   3. Thyroid mass; This had been set up but now needs to be rescheduled  4. History of CVA with left sided weakness on plavix and aspirin  5. DM: Due to decreased po intake, long acting insulin dose decreased to 10 units at night and she will continue with SSI and ADA dysphagia diet.  6. Right lower extremity intermittent twitching: Neurology was consulted. EEG was performed which showed slowing but no seizure activity. As per recommendations by neurologist no need for seizure medications at this point. However patient continues to have twitching and she may need medication for seizure due to her recent CVA she is at increased risk of seizures. Her Valium was initially held since admission and this was restarted which could be contributing and Wellbutrin was discontinued due to the potential to lower seizure threshold.  7. History of anxiety on Valium  8. Vaginal bleeding: Patient will need outpatient follow-up with GYN for possible endometrial biopsy .  DISCHARGE CONDITIONS AND DIET:   Stable for discharge on dysphagia 3 diet with aspiration precautions  CONSULTS OBTAINED:  Treatment Team:  Mick Sellavid P Fitzgerald, MD Thana FarrLeslie Reynolds, MD  DRUG ALLERGIES:  No Known Allergies  DISCHARGE MEDICATIONS:   Current Discharge Medication List    START taking these medications   Details  amoxicillin-clavulanate (AUGMENTIN) 875-125 MG tablet  Take 1 tablet by mouth every 12 (twelve) hours. Qty: 10 tablet, Refills: 0    doxycycline (VIBRA-TABS) 100 MG tablet Take 1 tablet (100 mg total) by mouth every 12 (twelve) hours. Qty: 10 tablet, Refills: 0      CONTINUE these medications which have CHANGED   Details  diazepam (VALIUM) 2 MG tablet Take 1 tablet (2 mg total) by mouth daily. Qty:  30 tablet, Refills: 0    insulin detemir (LEVEMIR) 100 UNIT/ML injection Inject 0.1 mLs (10 Units total) into the skin at bedtime. Qty: 10 mL, Refills: 0      CONTINUE these medications which have NOT CHANGED   Details  acetaminophen (TYLENOL) 325 MG tablet Take 650 mg by mouth every 6 (six) hours as needed.    Ascorbic Acid (VITAMIN C) 1000 MG tablet Take 1,000 mg by mouth daily.    aspirin EC 81 MG tablet Take 81 mg by mouth daily.    atorvastatin (LIPITOR) 40 MG tablet Take 1 tablet (40 mg total) by mouth daily at 6 PM. Qty: 30 tablet, Refills: 2    B Complex-C (SUPER B COMPLEX PO) Take 1 tablet by mouth daily.     bisacodyl (DULCOLAX) 5 MG EC tablet Take 1 tablet (5 mg total) by mouth daily as needed for moderate constipation. Qty: 30 tablet, Refills: 0    calcium carbonate (OS-CAL) 600 MG TABS tablet Take 600 mg by mouth daily.    Cholecalciferol (VITAMIN D3) 1000 UNITS CAPS Take 1,000 Units by mouth daily.    clopidogrel (PLAVIX) 75 MG tablet Take 75 mg by mouth daily.    Coenzyme Q10 (COQ10 PO) Take 1 capsule by mouth daily.     insulin lispro (HUMALOG) 100 UNIT/ML injection Inject into the skin once. Sliding scale 0-99=0 units 100-149=2 units 150-199=3 units 200-249=4 units 250-299=5 units 300-349=6 units 350-399= 7 units 400-449=8 units    losartan (COZAAR) 50 MG tablet Take 50 mg by mouth daily.    magnesium oxide (MAG-OX) 400 MG tablet Take 400 mg by mouth 2 (two) times daily.    meclizine (ANTIVERT) 25 MG tablet Take 1 tablet (25 mg total) by mouth 3 (three) times daily. Qty: 30 tablet, Refills: 0    Melatonin 10 MG TBDP Take 10 mg by mouth at bedtime.    Multiple Vitamins-Minerals (OCUVITE PO) Take 1 capsule by mouth daily.     Omega-3 Fatty Acids (FISH OIL) 1000 MG CPDR Take 1,200 mg by mouth daily.    omeprazole (PRILOSEC) 20 MG capsule Take 20 mg by mouth daily.    polyethylene glycol (MIRALAX / GLYCOLAX) packet Take 17 g by mouth daily.     senna (SENOKOT) 8.6 MG TABS tablet Take 1 tablet by mouth 2 (two) times daily.    triamcinolone cream (KENALOG) 0.1 % Apply 1 application topically 2 (two) times daily.    vitamin E 400 UNIT capsule Take 400 Units by mouth daily.      STOP taking these medications     amitriptyline (ELAVIL) 50 MG tablet      buPROPion (WELLBUTRIN XL) 300 MG 24 hr tablet      GLUCOSAMINE-CHONDROITIN PO               Today   CHIEF COMPLAINT:   No acute issues overnight   VITAL SIGNS:  Blood pressure 119/90, pulse 93, temperature (!) 96.5 F (35.8 C), resp. rate 16, height 5\' 7"  (1.702 m), weight 98.9 kg (218 lb), SpO2 93 %.   REVIEW  OF SYSTEMS:  Review of Systems  Constitutional: Negative for chills, fever and malaise/fatigue.  HENT: Negative.  Negative for ear discharge, ear pain, hearing loss, nosebleeds and sore throat.   Eyes: Negative.  Negative for blurred vision and pain.  Respiratory: Negative.  Negative for cough, hemoptysis, shortness of breath and wheezing.   Cardiovascular: Negative.  Negative for chest pain, palpitations and leg swelling.  Gastrointestinal: Negative.  Negative for abdominal pain, blood in stool, diarrhea, nausea and vomiting.  Genitourinary: Negative.  Negative for dysuria.  Musculoskeletal: Negative.  Negative for back pain.  Skin: Negative.   Neurological: Positive for weakness. Negative for dizziness, tremors, speech change, focal weakness, seizures and headaches.  Endo/Heme/Allergies: Negative.  Does not bruise/bleed easily.  Psychiatric/Behavioral: Negative.  Negative for depression, hallucinations and suicidal ideas.     PHYSICAL EXAMINATION:  GENERAL:  70 y.o.-year-old patient lying in the bed with no acute distress.  NECK:  Supple, no jugular venous distention. No thyroid enlargement, no tenderness.  LUNGS: Normal breath sounds bilaterally, no wheezing, rales,rhonchi  No use of accessory muscles of respiration.  CARDIOVASCULAR: S1, S2  normal. No murmurs, rubs, or gallops.  ABDOMEN: Soft, non-tender, non-distended. Bowel sounds present. No organomegaly or mass.  EXTREMITIES: No pedal edema, cyanosis, or clubbing.  PSYCHIATRIC: The patient is alert and oriented x 3.  SKIN: Chronic upper extremity discoloration No obvious rash, lesion, or ulcer. DATA REVIEW:   CBC  Recent Labs Lab 03/10/16 0902  WBC 15.9*  HGB 12.1  HCT 36.2  PLT 323    Chemistries   Recent Labs Lab 03/05/16 1412  03/07/16 0510  03/10/16 0902  NA 134*  < > 140  --  134*  K 5.3*  < > 4.2  --  3.6  CL 97*  < > 108  --  96*  CO2 26  < > 28  --  30  GLUCOSE 342*  < > 116*  --  192*  BUN 42*  < > 19  --  10  CREATININE 2.10*  < > 0.88  < > 0.65  CALCIUM 9.3  < > 7.8*  --  9.0  MG  --   < > 2.0  --   --   AST 27  --   --   --   --   ALT 34  --   --   --   --   ALKPHOS 112  --   --   --   --   BILITOT 1.2  --   --   --   --   < > = values in this interval not displayed.  Cardiac Enzymes No results for input(s): TROPONINI in the last 168 hours.  Microbiology Results  @MICRORSLT48 @  RADIOLOGY:  Ct Head Wo Contrast  Result Date: 03/10/2016 CLINICAL DATA:  Lethargy EXAM: CT HEAD WITHOUT CONTRAST TECHNIQUE: Contiguous axial images were obtained from the base of the skull through the vertex without intravenous contrast. COMPARISON:  MRI 02/06/2016 FINDINGS: Brain: Again noted are changes of infarction in the left frontal lobe and insular cortex, similar to prior MRI. Extensive chronic microvascular disease throughout the deep white matter. No new area of acute infarction. No hydrocephalus or hemorrhage. Vascular: No hyperdense vessel or unexpected calcification. Skull: No acute calvarial abnormality. Sinuses/Orbits: No acute findings. Other: None IMPRESSION: Stable changes of infarction in the left frontal lobe and insular cortex. Extensive chronic small vessel disease. Electronically Signed   By: Charlett Nose M.D.   On: 03/10/2016  11:50       Management plans discussed with the patient and she is in agreement. Stable for discharge snf  Patient should follow up with pcp  CODE STATUS:     Code Status Orders        Start     Ordered   03/05/16 1548  Full code  Continuous     03/05/16 1549    Code Status History    Date Active Date Inactive Code Status Order ID Comments User Context   02/06/2016  3:32 PM 02/11/2016  7:25 PM Full Code [id number]  Wyatt Haste, MD ED   02/01/2016  9:17 PM 02/05/2016  1:59 PM Full Code [id number]  Auburn Bilberry, MD Inpatient   01/14/2016  2:01 PM 01/16/2016 10:22 PM Full Code [id number]  Katha Hamming, MD ED   11/19/2014 10:40 PM 11/21/2014  8:55 PM Full Code [id number]  Oralia Manis, MD Inpatient   05/22/2014  6:31 PM 05/25/2014  8:20 PM Full Code [id number]  Toni Arthurs, MD Inpatient   05/21/2014 10:31 PM 05/22/2014  6:31 PM Full Code [id number]  Yevonne Pax, MD Inpatient      TOTAL TIME TAKING CARE OF THIS PATIENT: 36 minutes.    Note: This dictation was prepared with Dragon dictation along with smaller phrase technology. Any transcriptional errors that result from this process are unintentional.  Ramesh Moan M.D on 03/12/2016 at 9:58 AM  Between 7am to 6pm - Pager - [phone] After 6pm go to [email or web address] - Social research officer, government  [facility]  Office  [phone]  CC: Primary care physician; Elita Boone, MD

## 2016-03-12 NOTE — Progress Notes (Signed)
Physical Therapy Treatment Patient Details Name: Molly HughsBrenda P [name] MRN: [id number] DOB: 11/04/1945 Today's Date: 03/12/2016    History of Present Illness Pt. is a 70 y.o. female who was admitted to [facility] with respiratory Failure, septic SHock, and acute encephalopathy.    PT Comments    Pt ready for session. To edge of bed with max a x 2.  Sat EOB x 10 minutes for seated sitting balance.  Attempted standing with max a x 2 but unable to clear hips off bed.  Max a to return to supine and for rolling left/right.  Participated in exercises as described below.   Follow Up Recommendations  SNF     Equipment Recommendations       Recommendations for Other Services       Precautions / Restrictions Precautions Precautions: Fall Precaution Comments: Contact isolation Restrictions Weight Bearing Restrictions: No    Mobility  Bed Mobility Overal bed mobility: Needs Assistance Bed Mobility: Supine to Sit;Sit to Supine     Supine to sit: Total assist Sit to supine: Total assist;+2 for physical assistance   General bed mobility comments: constant cuing/assist for position and protection of L UE  Transfers Overall transfer level: Needs assistance   Transfers: Sit to/from Stand Sit to Stand: Total assist         General transfer comment: pt with improved sitting balance today so standing was attempted.  Pt unable to clear hips off bed with max a x 2.  Appropriate for lift transfer at this time for pt and staff safety  Ambulation/Gait             General Gait Details: unable   Stairs            Wheelchair Mobility    Modified Rankin (Stroke Patients Only)       Balance Overall balance assessment: Needs assistance Sitting-balance support: Feet supported;Single extremity supported   Sitting balance - Comments: R post/lateral lean.  Pt able to hold position for short periods of time only.  Unsafe to sit unattended for any period of time unsupported.      Standing balance-Leahy Scale: Zero                      Cognition Arousal/Alertness: Awake/alert Behavior During Therapy: WFL for tasks assessed/performed Overall Cognitive Status: Difficult to assess                 General Comments: Pt. able to follow simple one step commands.    Exercises Other Exercises Other Exercises: sitting balance exercises at edge of bed x  10 minutes.  Unable to correct LOBs without physical assist. Other Exercises: supine AAROM for BLE heel slides, SLR, SAQ and Ab/adduction 2 x 10 Other Exercises: Seated AAROM BLE 2 x 10 LAQ, marches    General Comments        Pertinent Vitals/Pain Pain Assessment: No/denies pain    Home Living                      Prior Function            PT Goals (current goals can now be found in the care plan section) Progress towards PT goals: Progressing toward goals    Frequency    Min 2X/week      PT Plan      Co-evaluation             End of Session Equipment Utilized During Treatment:  Gait belt Activity Tolerance: Patient limited by fatigue Patient left: in bed;with call bell/phone within reach;with bed alarm set     Time: 1011-1037 PT Time Calculation (min) (ACUTE ONLY): 26 min  Charges:  $Gait Training: 8-22 mins $Therapeutic Exercise: 8-22 mins                    G Codes:      Molly DessSarah Antwoine [name] 03/12/2016, 10:47 AM

## 2016-03-12 NOTE — Progress Notes (Signed)
Subjective: Patient and husband report shaking of the RLE improved.  None noted today.    Objective: Current vital signs: BP 134/72   Pulse 98   Temp 98.9 F (37.2 C) (Oral)   Resp 20   Ht 5\' 7"  (1.702 m)   Wt 98.9 kg (218 lb)   SpO2 93%   BMI 34.14 kg/m  Vital signs in last 24 hours: Temp:  [96.5 F (35.8 C)-99 F (37.2 C)] 98.9 F (37.2 C) (12/14 1142) Pulse Rate:  [91-98] 98 (12/14 1142) Resp:  [16-20] 20 (12/14 1142) BP: (119-161)/(56-90) 134/72 (12/14 1151) SpO2:  [92 %-96 %] 93 % (12/14 1142) Weight:  [98.9 kg (218 lb)] 98.9 kg (218 lb) (12/14 0600)  Intake/Output from previous day: 12/13 0701 - 12/14 0700 In: 250 [P.O.:250] Out: -  Intake/Output this shift: No intake/output data recorded. Nutritional status: DIET DYS 3 Room service appropriate? Yes with Assist; Fluid consistency: Thin  Neurologic Exam: Mental Status: Alert, thought content appropriate. Speech improved.  Able to follow 3 step commands without difficulty. Cranial Nerves: II: Discs flat bilaterally; Visual fields grossly normal, pupils equal, round, reactive to light and accommodation III,IV, VI: ptosis not present, extra-ocular motions intact bilaterally V,VII: decrease in right NLF, facial light touch sensation normal bilaterally VIII: hearing normal bilaterally IX,X: gag reflex present XI: bilateral shoulder shrug XII: midline tongue extension Motor: Right :Upper extremity 5/5Left: Upper extremity 0/5 Lower extremity 3/5Lower extremity 3/5 Tone and bulk:normal tone throughout; no atrophy noted   Lab Results: Basic Metabolic Panel:  Recent Labs Lab 03/05/16 1412 03/05/16 1855 03/06/16 0100 03/07/16 0510 03/09/16 0312 03/10/16 0902  NA 134* 136 136 140  --  134*  K 5.3* 5.1 4.6 4.2  --  3.6  CL 97* 103 106 108  --  96*  CO2 26 25 26 28   --  30  GLUCOSE 342* 303* 241*  116*  --  192*  BUN 42* 35* 36* 19  --  10  CREATININE 2.10* 1.83* 1.37* 0.88 0.72 0.65  CALCIUM 9.3 8.0* 7.8* 7.8*  --  9.0  MG  --   --  3.2* 2.0  --   --   PHOS  --   --  3.3  --   --   --     Liver Function Tests:  Recent Labs Lab 03/05/16 1412  AST 27  ALT 34  ALKPHOS 112  BILITOT 1.2  PROT 6.9  ALBUMIN 3.0*   No results for input(s): LIPASE, AMYLASE in the last 168 hours.  Recent Labs Lab 03/10/16 0902  AMMONIA 21    CBC:  Recent Labs Lab 03/05/16 1412 03/06/16 0100 03/07/16 0510 03/09/16 0312 03/10/16 0902  WBC 29.9* 27.7* 18.8* 13.4* 15.9*  HGB 14.8 12.3 11.3* 11.0* 12.1  HCT 45.6 37.5 34.8* 33.0* 36.2  MCV 92.8 92.7 93.9 92.5 90.1  PLT 328 247 230 270 323    Cardiac Enzymes: No results for input(s): CKTOTAL, CKMB, CKMBINDEX, TROPONINI in the last 168 hours.  Lipid Panel: No results for input(s): CHOL, TRIG, HDL, CHOLHDL, VLDL, LDLCALC in the last 168 hours.  CBG:  Recent Labs Lab 03/11/16 0726 03/11/16 1125 03/11/16 2053 03/12/16 0733 03/12/16 1114  GLUCAP 185* 172* 248* 204* 290*    Microbiology: Results for orders placed or performed during the hospital encounter of 03/05/16  Culture, blood (Routine x 2)     Status: None   Collection Time: 03/05/16  2:12 PM  Result Value Ref Range Status   Specimen  Description BLOOD RIGHT AC  Final   Special Requests   Final    BOTTLES DRAWN AEROBIC AND ANAEROBIC AER 7ML ANA 7ML   Culture NO GROWTH 5 DAYS  Final   Report Status 03/10/2016 FINAL  Final  Urine culture     Status: None   Collection Time: 03/05/16  2:12 PM  Result Value Ref Range Status   Specimen Description URINE, RANDOM  Final   Special Requests NONE  Final   Culture NO GROWTH Performed at [facility]   Final   Report Status 03/06/2016 FINAL  Final  Culture, blood (Routine x 2)     Status: Abnormal   Collection Time: 03/05/16  2:50 PM  Result Value Ref Range Status   Specimen Description BLOOD LEFT AC  Final    Special Requests   Final    BOTTLES DRAWN AEROBIC AND ANAEROBIC AER 13ML ANA 12ML   Culture  Setup Time   Final    GRAM POSITIVE COCCI IN BOTH AEROBIC AND ANAEROBIC BOTTLES CRITICAL RESULT CALLED TO, READ BACK BY AND VERIFIED WITH: KAREN HAYES 03/06/16 1356 SGD    Culture (A)  Final    STAPHYLOCOCCUS SPECIES (COAGULASE NEGATIVE) THE SIGNIFICANCE OF ISOLATING THIS ORGANISM FROM A SINGLE SET OF BLOOD CULTURES WHEN MULTIPLE SETS ARE DRAWN IS UNCERTAIN. PLEASE NOTIFY THE MICROBIOLOGY DEPARTMENT WITHIN ONE WEEK IF SPECIATION AND SENSITIVITIES ARE REQUIRED. Performed at [facility]    Report Status 03/08/2016 FINAL  Final  Blood Culture ID Panel (Reflexed)     Status: Abnormal   Collection Time: 03/05/16  2:50 PM  Result Value Ref Range Status   Enterococcus species NOT DETECTED NOT DETECTED Final   Listeria monocytogenes NOT DETECTED NOT DETECTED Final   Staphylococcus species DETECTED (A) NOT DETECTED Final    Comment: CRITICAL RESULT CALLED TO, READ BACK BY AND VERIFIED WITH: KAREN HAYES 03/06/16 1356 SGD    Staphylococcus aureus NOT DETECTED NOT DETECTED Final   Methicillin resistance DETECTED (A) NOT DETECTED Final    Comment: CRITICAL RESULT CALLED TO, READ BACK BY AND VERIFIED WITH: KAREN HAYES 03/06/16 1356 SGD    Streptococcus species NOT DETECTED NOT DETECTED Final   Streptococcus agalactiae NOT DETECTED NOT DETECTED Final   Streptococcus pneumoniae NOT DETECTED NOT DETECTED Final   Streptococcus pyogenes NOT DETECTED NOT DETECTED Final   Acinetobacter baumannii NOT DETECTED NOT DETECTED Final   Enterobacteriaceae species NOT DETECTED NOT DETECTED Final   Enterobacter cloacae complex NOT DETECTED NOT DETECTED Final   Escherichia coli NOT DETECTED NOT DETECTED Final   Klebsiella oxytoca NOT DETECTED NOT DETECTED Final   Klebsiella pneumoniae NOT DETECTED NOT DETECTED Final   Proteus species NOT DETECTED NOT DETECTED Final   Serratia marcescens NOT DETECTED NOT DETECTED  Final   Haemophilus influenzae NOT DETECTED NOT DETECTED Final   Neisseria meningitidis NOT DETECTED NOT DETECTED Final   Pseudomonas aeruginosa NOT DETECTED NOT DETECTED Final   Candida albicans NOT DETECTED NOT DETECTED Final   Candida glabrata NOT DETECTED NOT DETECTED Final   Candida krusei NOT DETECTED NOT DETECTED Final   Candida parapsilosis NOT DETECTED NOT DETECTED Final   Candida tropicalis NOT DETECTED NOT DETECTED Final  C difficile quick scan w PCR reflex     Status: None   Collection Time: 03/05/16  4:29 PM  Result Value Ref Range Status   C Diff antigen NEGATIVE NEGATIVE Final   C Diff toxin NEGATIVE NEGATIVE Final   C Diff interpretation No C. difficile detected.  Final  MRSA PCR Screening     Status: Abnormal   Collection Time: 03/05/16  5:42 PM  Result Value Ref Range Status   MRSA by PCR POSITIVE (A) NEGATIVE Final    Comment:        The GeneXpert MRSA Assay (FDA approved for NASAL specimens only), is one component of a comprehensive MRSA colonization surveillance program. It is not intended to diagnose MRSA infection nor to guide or monitor treatment for MRSA infections. RESULT CALLED TO, READ BACK BY AND VERIFIED WITH: KELSEY BLACK 03/05/16 1915 KLW   Culture, blood (Routine X 2) w Reflex to ID Panel     Status: None   Collection Time: 03/07/16 11:22 AM  Result Value Ref Range Status   Specimen Description BLOOD RIGHT ANTECUBITAL  Final   Special Requests BOTTLES DRAWN AEROBIC AND ANAEROBIC  10CC  Final   Culture NO GROWTH 5 DAYS  Final   Report Status 03/12/2016 FINAL  Final  Culture, blood (Routine X 2) w Reflex to ID Panel     Status: None   Collection Time: 03/07/16 11:22 AM  Result Value Ref Range Status   Specimen Description BLOOD LEFT HAND  Final   Special Requests   Final    BOTTLES DRAWN AEROBIC AND ANAEROBIC  AER 5CC ANA 6CC   Culture NO GROWTH 5 DAYS  Final   Report Status 03/12/2016 FINAL  Final    Coagulation Studies: No results for  input(s): LABPROT, INR in the last 72 hours.  Imaging: No results found.  Medications:  I have reviewed the patient's current medications. Scheduled: . amoxicillin-clavulanate  1 tablet Oral Q12H  . aspirin EC  81 mg Oral Daily  . atorvastatin  40 mg Oral q1800  . clopidogrel  75 mg Oral Daily  . diazepam  2 mg Oral Daily  . doxycycline  100 mg Oral Q12H  . enoxaparin (LOVENOX) injection  40 mg Subcutaneous Q24H  . insulin aspart  0-20 Units Subcutaneous TID AC & HS  . pantoprazole  40 mg Oral Daily    Assessment/Plan: Patient tolerating medication changes.  Unclear if involuntary movements were actually seizure but are consistent with where her most recent infarct is located.  Currently improved.  Would not consider AED trial unless they are recurrent.  EEG shows no epileptiform activity.      LOS: 7 days   Thana FarrLeslie Oslo Huntsman, MD Neurology [phone] 03/12/2016  12:52 PM

## 2016-03-12 NOTE — Progress Notes (Signed)
[facility] [place], answered all questions. EMS called for transport. VSS at this time.

## 2016-03-13 LAB — GLUCOSE, CAPILLARY: Glucose-Capillary: 180 mg/dL — ABNORMAL HIGH (ref 65–99)

## 2016-03-22 ENCOUNTER — Other Ambulatory Visit
Admission: RE | Admit: 2016-03-22 | Discharge: 2016-03-22 | Disposition: A | Discharge status: Home / Self Care | Payer: Medicare HMO | Source: Other Acute Inpatient Hospital | Attending: Family Medicine | Admitting: Family Medicine

## 2016-03-22 DIAGNOSIS — E11621 Type 2 diabetes mellitus with foot ulcer: Secondary | ICD-10-CM | POA: Insufficient documentation

## 2016-03-22 LAB — CBC WITH DIFFERENTIAL/PLATELET
BASOS PCT: 0 %
Basophils Absolute: 0 10*3/uL (ref 0–0.1)
EOS ABS: 0.2 10*3/uL (ref 0–0.7)
Eosinophils Relative: 2 %
HCT: 39.2 % (ref 35.0–47.0)
HEMOGLOBIN: 13 g/dL (ref 12.0–16.0)
Lymphocytes Relative: 23 %
Lymphs Abs: 2.8 10*3/uL (ref 1.0–3.6)
MCH: 30 pg (ref 26.0–34.0)
MCHC: 33.3 g/dL (ref 32.0–36.0)
MCV: 90 fL (ref 80.0–100.0)
MONOS PCT: 7 %
Monocytes Absolute: 0.9 10*3/uL (ref 0.2–0.9)
Neutro Abs: 8.6 10*3/uL — ABNORMAL HIGH (ref 1.4–6.5)
Neutrophils Relative %: 68 %
PLATELETS: 410 10*3/uL (ref 150–440)
RBC: 4.36 MIL/uL (ref 3.80–5.20)
RDW: 14.7 % — AB (ref 11.5–14.5)
WBC: 12.5 10*3/uL — ABNORMAL HIGH (ref 3.6–11.0)

## 2016-03-22 LAB — BASIC METABOLIC PANEL
Anion gap: 8 (ref 5–15)
BUN: 13 mg/dL (ref 6–20)
CALCIUM: 9.2 mg/dL (ref 8.9–10.3)
CHLORIDE: 95 mmol/L — AB (ref 101–111)
CO2: 31 mmol/L (ref 22–32)
CREATININE: 0.78 mg/dL (ref 0.44–1.00)
Glucose, Bld: 243 mg/dL — ABNORMAL HIGH (ref 65–99)
Potassium: 4.8 mmol/L (ref 3.5–5.1)
SODIUM: 134 mmol/L — AB (ref 135–145)

## 2016-03-22 LAB — C DIFFICILE QUICK SCREEN W PCR REFLEX
C DIFFICILE (CDIFF) TOXIN: NEGATIVE
C DIFFICLE (CDIFF) ANTIGEN: NEGATIVE
C Diff interpretation: NOT DETECTED

## 2016-03-29 ENCOUNTER — Other Ambulatory Visit
Admission: RE | Admit: 2016-03-29 | Discharge: 2016-03-29 | Disposition: A | Discharge status: Home / Self Care | Payer: Medicare HMO | Source: Ambulatory Visit | Attending: Family Medicine | Admitting: Family Medicine

## 2016-03-29 DIAGNOSIS — Z0189 Encounter for other specified special examinations: Secondary | ICD-10-CM | POA: Insufficient documentation

## 2016-03-29 LAB — CBC WITH DIFFERENTIAL/PLATELET
BASOS ABS: 0 10*3/uL (ref 0–0.1)
BASOS PCT: 0 %
Band Neutrophils: 14 %
EOS PCT: 0 %
Eosinophils Absolute: 0 10*3/uL (ref 0–0.7)
HCT: 36.5 % (ref 35.0–47.0)
HEMOGLOBIN: 12.2 g/dL (ref 12.0–16.0)
LYMPHS PCT: 25 %
Lymphs Abs: 3.2 10*3/uL (ref 1.0–3.6)
MCH: 29.5 pg (ref 26.0–34.0)
MCHC: 33.4 g/dL (ref 32.0–36.0)
MCV: 88.4 fL (ref 80.0–100.0)
MONO ABS: 2.2 10*3/uL — AB (ref 0.2–0.9)
Monocytes Relative: 17 %
NEUTROS PCT: 44 %
Neutro Abs: 7.5 10*3/uL — ABNORMAL HIGH (ref 1.4–6.5)
Platelets: 351 10*3/uL (ref 150–440)
RBC: 4.13 MIL/uL (ref 3.80–5.20)
RDW: 15 % — ABNORMAL HIGH (ref 11.5–14.5)
WBC: 12.9 10*3/uL — ABNORMAL HIGH (ref 3.6–11.0)

## 2016-03-29 LAB — COMPREHENSIVE METABOLIC PANEL
ALK PHOS: 72 U/L (ref 38–126)
ALT: 19 U/L (ref 14–54)
AST: 17 U/L (ref 15–41)
Albumin: 2.2 g/dL — ABNORMAL LOW (ref 3.5–5.0)
Anion gap: 7 (ref 5–15)
BUN: 31 mg/dL — AB (ref 6–20)
CALCIUM: 9.3 mg/dL (ref 8.9–10.3)
CO2: 31 mmol/L (ref 22–32)
CREATININE: 1.12 mg/dL — AB (ref 0.44–1.00)
Chloride: 94 mmol/L — ABNORMAL LOW (ref 101–111)
GFR calc non Af Amer: 49 mL/min — ABNORMAL LOW (ref >60)
GFR, EST AFRICAN AMERICAN: 56 mL/min — AB (ref >60)
GLUCOSE: 241 mg/dL — AB (ref 65–99)
Potassium: 5.9 mmol/L — ABNORMAL HIGH (ref 3.5–5.1)
SODIUM: 132 mmol/L — AB (ref 135–145)
Total Bilirubin: 1.2 mg/dL (ref 0.3–1.2)
Total Protein: 5.7 g/dL — ABNORMAL LOW (ref 6.5–8.1)

## 2016-04-27 ENCOUNTER — Encounter
Admission: EM | Disposition: A | Discharge status: Skilled Nursing Facility | Payer: Self-pay | Source: Home / Self Care | Attending: Internal Medicine

## 2016-04-27 ENCOUNTER — Inpatient Hospital Stay
Admission: EM | Admit: 2016-04-27 | Discharge: 2016-04-30 | DRG: 378 | Disposition: A | Discharge status: Skilled Nursing Facility | Payer: Medicare Other | Attending: Internal Medicine | Admitting: Internal Medicine

## 2016-04-27 ENCOUNTER — Encounter: Payer: Self-pay | Admitting: Emergency Medicine

## 2016-04-27 DIAGNOSIS — Z7902 Long term (current) use of antithrombotics/antiplatelets: Secondary | ICD-10-CM | POA: Diagnosis not present

## 2016-04-27 DIAGNOSIS — L89152 Pressure ulcer of sacral region, stage 2: Secondary | ICD-10-CM | POA: Insufficient documentation

## 2016-04-27 DIAGNOSIS — K529 Noninfective gastroenteritis and colitis, unspecified: Secondary | ICD-10-CM | POA: Diagnosis present

## 2016-04-27 DIAGNOSIS — F419 Anxiety disorder, unspecified: Secondary | ICD-10-CM | POA: Diagnosis present

## 2016-04-27 DIAGNOSIS — K5120 Ulcerative (chronic) proctitis without complications: Secondary | ICD-10-CM | POA: Diagnosis not present

## 2016-04-27 DIAGNOSIS — E785 Hyperlipidemia, unspecified: Secondary | ICD-10-CM | POA: Diagnosis not present

## 2016-04-27 DIAGNOSIS — L8990 Pressure ulcer of unspecified site, unspecified stage: Secondary | ICD-10-CM | POA: Diagnosis not present

## 2016-04-27 DIAGNOSIS — Z823 Family history of stroke: Secondary | ICD-10-CM | POA: Diagnosis not present

## 2016-04-27 DIAGNOSIS — Z794 Long term (current) use of insulin: Secondary | ICD-10-CM | POA: Diagnosis not present

## 2016-04-27 DIAGNOSIS — Z79899 Other long term (current) drug therapy: Secondary | ICD-10-CM | POA: Diagnosis not present

## 2016-04-27 DIAGNOSIS — Z7982 Long term (current) use of aspirin: Secondary | ICD-10-CM

## 2016-04-27 DIAGNOSIS — J449 Chronic obstructive pulmonary disease, unspecified: Secondary | ICD-10-CM | POA: Diagnosis not present

## 2016-04-27 DIAGNOSIS — D62 Acute posthemorrhagic anemia: Secondary | ICD-10-CM

## 2016-04-27 DIAGNOSIS — K2970 Gastritis, unspecified, without bleeding: Secondary | ICD-10-CM | POA: Diagnosis not present

## 2016-04-27 DIAGNOSIS — Z87891 Personal history of nicotine dependence: Secondary | ICD-10-CM | POA: Diagnosis not present

## 2016-04-27 DIAGNOSIS — I10 Essential (primary) hypertension: Secondary | ICD-10-CM | POA: Diagnosis not present

## 2016-04-27 DIAGNOSIS — K922 Gastrointestinal hemorrhage, unspecified: Secondary | ICD-10-CM | POA: Diagnosis present

## 2016-04-27 DIAGNOSIS — I69354 Hemiplegia and hemiparesis following cerebral infarction affecting left non-dominant side: Secondary | ICD-10-CM | POA: Diagnosis not present

## 2016-04-27 DIAGNOSIS — K625 Hemorrhage of anus and rectum: Principal | ICD-10-CM | POA: Diagnosis present

## 2016-04-27 DIAGNOSIS — K921 Melena: Secondary | ICD-10-CM

## 2016-04-27 DIAGNOSIS — R32 Unspecified urinary incontinence: Secondary | ICD-10-CM | POA: Diagnosis present

## 2016-04-27 DIAGNOSIS — E119 Type 2 diabetes mellitus without complications: Secondary | ICD-10-CM | POA: Diagnosis present

## 2016-04-27 HISTORY — PX: ESOPHAGOGASTRODUODENOSCOPY (EGD) WITH PROPOFOL: SHX5813

## 2016-04-27 LAB — COMPREHENSIVE METABOLIC PANEL
ALBUMIN: 1.9 g/dL — AB (ref 3.5–5.0)
ALT: 21 U/L (ref 14–54)
AST: 23 U/L (ref 15–41)
Alkaline Phosphatase: 73 U/L (ref 38–126)
Anion gap: 5 (ref 5–15)
BILIRUBIN TOTAL: 0.7 mg/dL (ref 0.3–1.2)
BUN: 11 mg/dL (ref 6–20)
CO2: 33 mmol/L — ABNORMAL HIGH (ref 22–32)
CREATININE: 0.58 mg/dL (ref 0.44–1.00)
Calcium: 8 mg/dL — ABNORMAL LOW (ref 8.9–10.3)
Chloride: 95 mmol/L — ABNORMAL LOW (ref 101–111)
GFR calc Af Amer: >60 mL/min (ref >60)
GLUCOSE: 197 mg/dL — AB (ref 65–99)
Potassium: 3.7 mmol/L (ref 3.5–5.1)
Sodium: 133 mmol/L — ABNORMAL LOW (ref 135–145)
Total Protein: 6.2 g/dL — ABNORMAL LOW (ref 6.5–8.1)

## 2016-04-27 LAB — CBC
HCT: 33.3 % — ABNORMAL LOW (ref 35.0–47.0)
Hemoglobin: 11.1 g/dL — ABNORMAL LOW (ref 12.0–16.0)
MCH: 28.5 pg (ref 26.0–34.0)
MCHC: 33.5 g/dL (ref 32.0–36.0)
MCV: 85.2 fL (ref 80.0–100.0)
PLATELETS: 391 10*3/uL (ref 150–440)
RBC: 3.91 MIL/uL (ref 3.80–5.20)
RDW: 16.5 % — AB (ref 11.5–14.5)
WBC: 11 10*3/uL (ref 3.6–11.0)

## 2016-04-27 LAB — IRON AND TIBC
Iron: 23 ug/dL — ABNORMAL LOW (ref 28–170)
Saturation Ratios: 16 % (ref 10.4–31.8)
TIBC: 148 ug/dL — ABNORMAL LOW (ref 250–450)
UIBC: 125 ug/dL

## 2016-04-27 LAB — HEMOGLOBIN: HEMOGLOBIN: 10.4 g/dL — AB (ref 12.0–16.0)

## 2016-04-27 LAB — FERRITIN: Ferritin: 424 ng/mL — ABNORMAL HIGH (ref 11–307)

## 2016-04-27 LAB — GLUCOSE, CAPILLARY
GLUCOSE-CAPILLARY: 134 mg/dL — AB (ref 65–99)
Glucose-Capillary: 112 mg/dL — ABNORMAL HIGH (ref 65–99)
Glucose-Capillary: 146 mg/dL — ABNORMAL HIGH (ref 65–99)

## 2016-04-27 LAB — VITAMIN B12: VITAMIN B 12: 957 pg/mL — AB (ref 180–914)

## 2016-04-27 LAB — MRSA PCR SCREENING: MRSA BY PCR: NEGATIVE

## 2016-04-27 SURGERY — ESOPHAGOGASTRODUODENOSCOPY (EGD) WITH PROPOFOL
Anesthesia: Monitor Anesthesia Care

## 2016-04-27 MED ORDER — PEG 3350-KCL-NA BICARB-NACL 420 G PO SOLR
4000.0000 mL | Freq: Once | ORAL | Status: AC
  Administered 2016-04-27: 4000 mL via ORAL
  Filled 2016-04-27: qty 4000

## 2016-04-27 MED ORDER — ALBUTEROL SULFATE (2.5 MG/3ML) 0.083% IN NEBU: 2.5000 mg | INHALATION_SOLUTION | RESPIRATORY_TRACT | Status: DC | PRN

## 2016-04-27 MED ORDER — PANTOPRAZOLE SODIUM 40 MG IV SOLR
40.0000 mg | Freq: Two times a day (BID) | INTRAVENOUS | Status: DC
  Administered 2016-04-27 – 2016-04-30 (×7): 40 mg via INTRAVENOUS
  Filled 2016-04-27 (×7): qty 40

## 2016-04-27 MED ORDER — INSULIN ASPART 100 UNIT/ML ~~LOC~~ SOLN
0.0000 [IU] | SUBCUTANEOUS | Status: DC
  Administered 2016-04-27: 1 [IU] via SUBCUTANEOUS
  Administered 2016-04-28 (×2): 2 [IU] via SUBCUTANEOUS
  Administered 2016-04-28: 5 [IU] via SUBCUTANEOUS
  Administered 2016-04-28 (×2): 1 [IU] via SUBCUTANEOUS
  Administered 2016-04-29: 2 [IU] via SUBCUTANEOUS
  Administered 2016-04-29: 1 [IU] via SUBCUTANEOUS
  Filled 2016-04-27: qty 1
  Filled 2016-04-27: qty 2
  Filled 2016-04-27: qty 1
  Filled 2016-04-27: qty 2
  Filled 2016-04-27: qty 1
  Filled 2016-04-27: qty 2
  Filled 2016-04-27: qty 1
  Filled 2016-04-27: qty 5

## 2016-04-27 MED ORDER — FENTANYL CITRATE (PF) 100 MCG/2ML IJ SOLN
INTRAMUSCULAR | Status: AC
Start: 2016-04-27 — End: 2016-04-28
  Filled 2016-04-27: qty 4

## 2016-04-27 MED ORDER — MAGNESIUM OXIDE 400 (241.3 MG) MG PO TABS
400.0000 mg | ORAL_TABLET | Freq: Two times a day (BID) | ORAL | Status: DC
  Administered 2016-04-27 – 2016-04-30 (×6): 400 mg via ORAL
  Filled 2016-04-27 (×7): qty 1

## 2016-04-27 MED ORDER — MECLIZINE HCL 25 MG PO TABS: 25.0000 mg | ORAL_TABLET | Freq: Three times a day (TID) | ORAL | Status: DC | PRN

## 2016-04-27 MED ORDER — CITALOPRAM HYDROBROMIDE 10 MG PO TABS
10.0000 mg | ORAL_TABLET | Freq: Every day | ORAL | Status: DC
  Administered 2016-04-28 – 2016-04-30 (×3): 10 mg via ORAL
  Filled 2016-04-27 (×3): qty 1

## 2016-04-27 MED ORDER — POLYETHYLENE GLYCOL 3350 17 G PO PACK: 17.0000 g | PACK | Freq: Every day | ORAL | Status: DC

## 2016-04-27 MED ORDER — ATORVASTATIN CALCIUM 20 MG PO TABS
40.0000 mg | ORAL_TABLET | Freq: Every day | ORAL | Status: DC
  Administered 2016-04-27 – 2016-04-29 (×3): 40 mg via ORAL
  Filled 2016-04-27 (×3): qty 2

## 2016-04-27 MED ORDER — POLYETHYLENE GLYCOL 3350 17 G PO PACK: 17.0000 g | PACK | Freq: Every day | ORAL | Status: DC | PRN

## 2016-04-27 MED ORDER — MIDAZOLAM HCL 5 MG/5ML IJ SOLN
INTRAMUSCULAR | Status: AC
  Filled 2016-04-27: qty 10

## 2016-04-27 MED ORDER — INSULIN DETEMIR 100 UNIT/ML ~~LOC~~ SOLN
5.0000 [IU] | Freq: Every day | SUBCUTANEOUS | Status: DC
  Administered 2016-04-27 – 2016-04-29 (×3): 5 [IU] via SUBCUTANEOUS
  Filled 2016-04-27 (×4): qty 0.05

## 2016-04-27 MED ORDER — ACETAMINOPHEN 325 MG PO TABS: 650.0000 mg | ORAL_TABLET | Freq: Four times a day (QID) | ORAL | Status: DC | PRN

## 2016-04-27 MED ORDER — MIDAZOLAM HCL 5 MG/5ML IJ SOLN
INTRAMUSCULAR | Status: DC | PRN
  Administered 2016-04-27 (×2): 2 mg via INTRAVENOUS

## 2016-04-27 MED ORDER — POTASSIUM CHLORIDE IN NACL 20-0.9 MEQ/L-% IV SOLN
INTRAVENOUS | Status: DC
  Administered 2016-04-27 – 2016-04-30 (×5): via INTRAVENOUS
  Filled 2016-04-27 (×8): qty 1000

## 2016-04-27 MED ORDER — ACETAMINOPHEN 650 MG RE SUPP: 650.0000 mg | Freq: Four times a day (QID) | RECTAL | Status: DC | PRN

## 2016-04-27 MED ORDER — DIAZEPAM 2 MG PO TABS
2.0000 mg | ORAL_TABLET | Freq: Every day | ORAL | Status: DC
  Administered 2016-04-28 – 2016-04-30 (×3): 2 mg via ORAL
  Filled 2016-04-27 (×3): qty 1

## 2016-04-27 MED ORDER — FENTANYL CITRATE (PF) 100 MCG/2ML IJ SOLN
INTRAMUSCULAR | Status: DC | PRN
  Administered 2016-04-27 (×2): 25 ug via INTRAVENOUS

## 2016-04-27 NOTE — Op Note (Signed)
[facility] Gastroenterology Patient Name: Molly [name] Procedure Date: 04/27/2016 12:42 PM MRN: [id number] Account #: [id number] Date of Birth: [birth year] Admit Type: Inpatient Age: 71 Room: [facility] ENDO ROOM 4 Gender: Female Note Status: Finalized Procedure:            Upper GI endoscopy Indications:          Melena Providers:            Midge Minium MD, MD Medicines:            Fentanyl 50 micrograms IV, Midazolam 4 mg IV Complications:        No immediate complications. Procedure:            Pre-Anesthesia Assessment:                       - Prior to the procedure, a History and Physical was                        performed, and patient medications and allergies were                        reviewed. The patient's tolerance of previous                        anesthesia was also reviewed. The risks and benefits of                        the procedure and the sedation options and risks were                        discussed with the patient. All questions were                        answered, and informed consent was obtained. Prior                        Anticoagulants: The patient has taken no previous                        anticoagulant or antiplatelet agents. ASA Grade                        Assessment: II - A patient with mild systemic disease.                        After reviewing the risks and benefits, the patient was                        deemed in satisfactory condition to undergo the                        procedure.                       After obtaining informed consent, the endoscope was                        passed under direct vision. Throughout the procedure,                        the  patient's blood pressure, pulse, and oxygen                        saturations were monitored continuously. The Endoscope                        was introduced through the mouth, and advanced to the                        second part of duodenum. The upper GI  endoscopy was                        accomplished without difficulty. The patient tolerated                        the procedure well. Findings:      The examined esophagus was normal.      Localized mild inflammation characterized by erythema was found in the       gastric antrum.      The examined duodenum was normal. Impression:           - Normal esophagus.                       - Gastritis.                       - Normal examined duodenum.                       - No specimens collected.                       - No old or fresh blood seen. Recommendation:       - Return patient to hospital ward for observation.                       - Continue present medications.                       - Perform a colonoscopy tomorrow. Procedure Code(s):    --- Professional ---                       [phone], Esophagogastroduodenoscopy, flexible, transoral;                        diagnostic, including collection of specimen(s) by                        brushing or washing, when performed (separate procedure) Diagnosis Code(s):    --- Professional ---                       K92.1, Melena (includes Hematochezia)                       K29.70, Gastritis, unspecified, without bleeding CPT copyright 2016 American Medical Association. All rights reserved. The codes documented in this report are preliminary and upon coder review may  be revised to meet current compliance requirements. Midge Miniumarren Kyros Salzwedel MD, MD 04/27/2016 12:57:18 PM This report has been signed electronically. Number of Addenda: 0 Note Initiated On: 04/27/2016 12:42 PM      [facility]

## 2016-04-27 NOTE — Consult Note (Signed)
Molly Minium, MD [facility]  [address], [address] [place], [place] [address] Phone: [phone] Fax : [phone]  Consultation  Referring Provider:     Dr. Elpidio Anis Primary Care Physician:  Elita Boone, MD Primary Gastroenterologist:  None        Reason for Consultation:     Melena  Date of Admission:  04/27/2016 Date of Consultation:  04/27/2016         HPI:   Molly [name] is a 71 y.o. female was a history of a CVA and was on Plavix. The patient reports that she's had 3 weeks of black stools. The patient denies any abdominal pain. The patient has not taken the Plavix since yesterday. She had been found to have blood in her diaper and it was initially thought that she may have vaginal bleeding but then the patient was noted to have blood per rectum and brought to the emergency room. The patient had a baseline hemoglobin of 12.9 four weeks ago with a repeat hemoglobin of 11.1 this morning. She denies being told she had any GI problems in the past and had a colonoscopy in 2015 by Dr. Evette Cristal. That colonoscopy was reported to be normal. The patient denies ever having a EGD in the past. The patient's husband states that the patient has a history of mental confusion with memory loss and that's why she was seen by neurology and was put on Plavix and aspirin.  Past Medical History:  Diagnosis Date  . Anxiety   . COPD (chronic obstructive pulmonary disease) (HCC)   . Diabetes (HCC)   . Hemorrhoid   . HLD (hyperlipidemia)   . HTN (hypertension)   . Stroke [facility]) 2008   left weakness    Past Surgical History:  Procedure Laterality Date  . AMPUTATION Left 05/22/2014   Procedure: AMPUTATION RAY LEFT FOURTH AND FIFTH ;  Surgeon: Toni Arthurs, MD;  [facility];  Service: Orthopedics;  Laterality: Left;  . CATARACT EXTRACTION    . COLONOSCOPY  2004    Prior to Admission medications   Medication Sig Start Date End Date Taking? Authorizing Provider  acetaminophen (TYLENOL) 325 MG tablet  Take 650 mg by mouth every 6 (six) hours as needed.   Yes Historical Provider, MD  aspirin EC 81 MG tablet Take 81 mg by mouth daily.   Yes Historical Provider, MD  atorvastatin (LIPITOR) 40 MG tablet Take 1 tablet (40 mg total) by mouth daily at 6 PM. 02/03/16  Yes Enid Baas, MD  B Complex-C (SUPER B COMPLEX PO) Take 1 tablet by mouth daily.    Yes Historical Provider, MD  bisacodyl (DULCOLAX) 5 MG EC tablet Take 1 tablet (5 mg total) by mouth daily as needed for moderate constipation. 01/16/16  Yes Ramonita Lab, MD  calcium carbonate (OS-CAL) 600 MG TABS tablet Take 600 mg by mouth 2 (two) times daily.    Yes Historical Provider, MD  Cholecalciferol (VITAMIN D3) 1000 UNITS CAPS Take 1,000 Units by mouth daily.   Yes Historical Provider, MD  citalopram (CELEXA) 10 MG tablet Take 10 mg by mouth daily.   Yes Historical Provider, MD  clopidogrel (PLAVIX) 75 MG tablet Take 75 mg by mouth daily.   Yes Historical Provider, MD  diazepam (VALIUM) 2 MG tablet Take 1 tablet (2 mg total) by mouth daily. 03/12/16  Yes Sital Mody, MD  insulin detemir (LEVEMIR) 100 UNIT/ML injection Inject 0.1 mLs (10 Units total) into the skin at bedtime. 03/12/16  Yes Sital  Mody, MD  meclizine (ANTIVERT) 25 MG tablet Take 1 tablet (25 mg total) by mouth 3 (three) times daily. Patient taking differently: Take 25 mg by mouth 3 (three) times daily as needed (vertigo).  01/16/16  Yes Ramonita LabAruna Gouru, MD  Melatonin 10 MG TBDP Take 10 mg by mouth at bedtime.   Yes Historical Provider, MD  omeprazole (PRILOSEC) 20 MG capsule Take 20 mg by mouth daily.   Yes Historical Provider, MD  triamcinolone cream (KENALOG) 0.1 % Apply 1 application topically 2 (two) times daily. Behind right ear   Yes Historical Provider, MD  losartan (COZAAR) 50 MG tablet Take 50 mg by mouth daily.    Historical Provider, MD  magnesium oxide (MAG-OX) 400 MG tablet Take 400 mg by mouth 2 (two) times daily.    Historical Provider, MD  polyethylene glycol  (MIRALAX / GLYCOLAX) packet Take 17 g by mouth daily.    Historical Provider, MD  senna (SENOKOT) 8.6 MG TABS tablet Take 1 tablet by mouth 2 (two) times daily.    Historical Provider, MD    Family History  Problem Relation Age of Onset  . Hyperlipidemia Mother   . Varicose Veins Mother   . Deep vein thrombosis Father   . Stroke Father   . Breast cancer Paternal Aunt   . Stroke Maternal Grandmother   . Stroke Paternal Grandmother      Social History  Substance Use Topics  . Smoking status: Former Smoker    Packs/day: 0.50    Years: 2.00    Types: Cigarettes    Quit date: 11/18/1968  . Smokeless tobacco: Never Used  . Alcohol use No     Comment: seldom    Allergies as of 04/27/2016  . (No Known Allergies)    Review of Systems:    All systems reviewed and negative except where noted in HPI.   Physical Exam:  Vital signs in last 24 hours: Temp:  [98.1 F (36.7 C)-98.3 F (36.8 C)] 98.1 F (36.7 C) (01/29 1114) Pulse Rate:  [102-110] 106 (01/29 1114) Resp:  [16-18] 18 (01/29 1114) BP: (103-115)/(65-76) 107/65 (01/29 1114) SpO2:  [95 %-98 %] 95 % (01/29 1114) Weight:  [230 lb (104.3 kg)] 230 lb (104.3 kg) (01/29 [id number])   General:   Pleasant, cooperative in NAD Head:  Normocephalic and atraumatic. Eyes:   No icterus.   Conjunctiva pink. PERRLA. Ears:  Normal auditory acuity. Neck:  Supple; no masses or thyroidomegaly Lungs: Respirations even and unlabored. Lungs clear to auscultation bilaterally.   No wheezes, crackles, or rhonchi.  Heart:  Regular rate and rhythm;  Without murmur, clicks, rubs or gallops Abdomen:  Soft, nondistended, nontender. Normal bowel sounds. No appreciable masses or hepatomegaly.  No rebound or guarding.  Rectal:  Not performed. Msk:  Symmetrical without gross deformities.   Extremities:  Without edema, cyanosis or clubbing. Neurologic:  Alert and awake;  grossly normal neurologically. Skin:  Intact without significant lesions or  rashes. Cervical Nodes:  No significant cervical adenopathy. Psych:  Alert and cooperative. Normal affect.  LAB RESULTS:  Recent Labs  04/27/16 0625  WBC 11.0  HGB 11.1*  HCT 33.3*  PLT 391   BMET  Recent Labs  04/27/16 0625  NA 133*  K 3.7  CL 95*  CO2 33*  GLUCOSE 197*  BUN 11  CREATININE 0.58  CALCIUM 8.0*   LFT  Recent Labs  04/27/16 0625  PROT 6.2*  ALBUMIN 1.9*  AST 23  ALT 21  ALKPHOS  73  BILITOT 0.7   PT/INR No results for input(s): LABPROT, INR in the last 72 hours.  STUDIES: No results found.    Impression / Plan:   Molly [name] is a 71 y.o. y/o female with an admission for melena. The patient also has a drop in her hemoglobin. The patient has been nothing by mouth and will be set up for an EGD for today. The patient had a colonoscopy 2 years ago and a repeat colonoscopy will not be repeated at this time. I have discussed risks & benefits which include, but are not limited to, bleeding, infection, perforation & drug reaction.  The patient agrees with this plan & written consent will be obtained.     Thank you for involving me in the care of this patient.      LOS: 0 days   Molly Minium, MD  04/27/2016, 12:02 PM   Note: This dictation was prepared with Dragon dictation along with smaller phrase technology. Any transcriptional errors that result from this process are unintentional.

## 2016-04-27 NOTE — Progress Notes (Signed)
Bowel prep has started for colonoscopy tomorrow 04/28/2016.  [place]olly [name] [place]

## 2016-04-27 NOTE — H&P (Signed)
[facility] [facility] [place] [facility]   PATIENT NAME: Molly [name]    MR#:  [id number]  DATE OF BIRTH:  [birth year]  DATE OF ADMISSION:  04/27/2016  PRIMARY CARE PHYSICIAN: Elita Boone, MD   REQUESTING/REFERRING PHYSICIAN: Dr. Cyril Loosen  CHIEF COMPLAINT:   Chief Complaint  Patient presents with  . Rectal Bleeding    HISTORY OF PRESENT ILLNESS:  Molly [name]  is a 71 y.o. female with a known history of CVA, hypertension, chronic Foley catheter, hemorrhoids presents to the emergency room after nursing home staff found patient to have blood in diaper. Initially this was thought to be vaginal issues had in the past but after seeing blood in her rectum patient was brought to the emergency room. Here she has had one more episode of melanotic stool with bright red blood. Patient has drop in her hemoglobin from 13-11 and is being admitted to the hospitalist service. She is on aspirin and Plavix since her stroke. Patient is currently working with physical therapy. Has left-sided weakness from old stroke. Recent stroke in November 2017 has caused more memory problems and personality changes at the frontal stroke.  History obtained from ED staff, old records, patient and husband at bedside.  No abdominal pain or nausea or vomiting or fever.  PAST MEDICAL HISTORY:   Past Medical History:  Diagnosis Date  . Anxiety   . COPD (chronic obstructive pulmonary disease) (HCC)   . Diabetes (HCC)   . Hemorrhoid   . HLD (hyperlipidemia)   . HTN (hypertension)   . Stroke [facility]) 2008   left weakness    PAST SURGICAL HISTORY:   Past Surgical History:  Procedure Laterality Date  . AMPUTATION Left 05/22/2014   Procedure: AMPUTATION RAY LEFT FOURTH AND FIFTH ;  Surgeon: Toni Arthurs, MD;  Location: [facility] OR;  Service: Orthopedics;  Laterality: Left;  . CATARACT EXTRACTION    . COLONOSCOPY  2004    SOCIAL HISTORY:   Social History  Substance Use Topics  . Smoking status:  Former Smoker    Packs/day: 0.50    Years: 2.00    Types: Cigarettes    Quit date: 11/18/1968  . Smokeless tobacco: Never Used  . Alcohol use No     Comment: seldom    FAMILY HISTORY:   Family History  Problem Relation Age of Onset  . Hyperlipidemia Mother   . Varicose Veins Mother   . Deep vein thrombosis Father   . Stroke Father   . Breast cancer Paternal Aunt   . Stroke Maternal Grandmother   . Stroke Paternal Grandmother     DRUG ALLERGIES:  No Known Allergies  REVIEW OF SYSTEMS:   Review of Systems  Constitutional: Positive for malaise/fatigue. Negative for chills, fever and weight loss.  HENT: Negative for hearing loss and nosebleeds.   Eyes: Negative for blurred vision, double vision and pain.  Respiratory: Negative for cough, hemoptysis, sputum production, shortness of breath and wheezing.   Cardiovascular: Negative for chest pain, palpitations, orthopnea and leg swelling.  Gastrointestinal: Positive for blood in stool and melena. Negative for abdominal pain, constipation, diarrhea, nausea and vomiting.  Genitourinary: Negative for dysuria and hematuria.  Musculoskeletal: Positive for back pain. Negative for falls and myalgias.  Skin: Negative for rash.  Neurological: Positive for weakness. Negative for dizziness, tremors, sensory change, speech change, focal weakness, seizures and headaches.  Endo/Heme/Allergies: Does not bruise/bleed easily.  Psychiatric/Behavioral: Positive for memory loss. Negative for depression. The patient is not nervous/anxious.  MEDICATIONS AT HOME:   Prior to Admission medications   Medication Sig Start Date End Date Taking? Authorizing Provider  acetaminophen (TYLENOL) 325 MG tablet Take 650 mg by mouth every 6 (six) hours as needed.   Yes Historical Provider, MD  aspirin EC 81 MG tablet Take 81 mg by mouth daily.   Yes Historical Provider, MD  atorvastatin (LIPITOR) 40 MG tablet Take 1 tablet (40 mg total) by mouth daily at 6 PM.  02/03/16  Yes Enid Baasadhika Kalisetti, MD  B Complex-C (SUPER B COMPLEX PO) Take 1 tablet by mouth daily.    Yes Historical Provider, MD  bisacodyl (DULCOLAX) 5 MG EC tablet Take 1 tablet (5 mg total) by mouth daily as needed for moderate constipation. 01/16/16  Yes Ramonita LabAruna Gouru, MD  Cholecalciferol (VITAMIN D3) 1000 UNITS CAPS Take 1,000 Units by mouth daily.   Yes Historical Provider, MD  citalopram (CELEXA) 10 MG tablet Take 10 mg by mouth daily.   Yes Historical Provider, MD  clopidogrel (PLAVIX) 75 MG tablet Take 75 mg by mouth daily.   Yes Historical Provider, MD  diazepam (VALIUM) 2 MG tablet Take 1 tablet (2 mg total) by mouth daily. 03/12/16  Yes Sital Mody, MD  insulin detemir (LEVEMIR) 100 UNIT/ML injection Inject 0.1 mLs (10 Units total) into the skin at bedtime. 03/12/16  Yes Adrian SaranSital Mody, MD  meclizine (ANTIVERT) 25 MG tablet Take 1 tablet (25 mg total) by mouth 3 (three) times daily. Patient taking differently: Take 25 mg by mouth 3 (three) times daily as needed (vertigo).  01/16/16  Yes Ramonita LabAruna Gouru, MD  Melatonin 10 MG TBDP Take 10 mg by mouth at bedtime.   Yes Historical Provider, MD  omeprazole (PRILOSEC) 20 MG capsule Take 20 mg by mouth daily.   Yes Historical Provider, MD  triamcinolone cream (KENALOG) 0.1 % Apply 1 application topically 2 (two) times daily. Behind right ear   Yes Historical Provider, MD  Ascorbic Acid (VITAMIN C) 1000 MG tablet Take 1,000 mg by mouth daily.    Historical Provider, MD  calcium carbonate (OS-CAL) 600 MG TABS tablet Take 600 mg by mouth daily.    Historical Provider, MD  Coenzyme Q10 (COQ10 PO) Take 1 capsule by mouth daily.     Historical Provider, MD  insulin lispro (HUMALOG) 100 UNIT/ML injection Inject into the skin once. Sliding scale 0-99=0 units 100-149=2 units 150-199=3 units 200-249=4 units 250-299=5 units 300-349=6 units 350-399= 7 units 400-449=8 units    Historical Provider, MD  losartan (COZAAR) 50 MG tablet Take 50 mg by mouth daily.     Historical Provider, MD  magnesium oxide (MAG-OX) 400 MG tablet Take 400 mg by mouth 2 (two) times daily.    Historical Provider, MD  Multiple Vitamins-Minerals (OCUVITE PO) Take 1 capsule by mouth daily.     Historical Provider, MD  Omega-3 Fatty Acids (FISH OIL) 1000 MG CPDR Take 1,200 mg by mouth daily.    Historical Provider, MD  polyethylene glycol (MIRALAX / GLYCOLAX) packet Take 17 g by mouth daily.    Historical Provider, MD  senna (SENOKOT) 8.6 MG TABS tablet Take 1 tablet by mouth 2 (two) times daily.    Historical Provider, MD  vitamin E 400 UNIT capsule Take 400 Units by mouth daily.    Historical Provider, MD     VITAL SIGNS:  Blood pressure 112/76, pulse (!) 110, temperature 98.3 F (36.8 C), temperature source Oral, resp. rate 16, height 5\' 5"  (1.651 m), weight 104.3 kg (230 lb),  SpO2 98 %.  PHYSICAL EXAMINATION:  Physical Exam  GENERAL:  71 y.o.-year-old patient lying in the bed with no acute distress.  EYES: Pupils equal, round, reactive to light and accommodation. No scleral icterus. Extraocular muscles intact.  HEENT: Head atraumatic, normocephalic. Oropharynx and nasopharynx clear. No oropharyngeal erythema, moist oral mucosa  NECK:  Supple, no jugular venous distention. No thyroid enlargement, no tenderness.  LUNGS: Normal breath sounds bilaterally, no wheezing, rales, rhonchi. No use of accessory muscles of respiration.  CARDIOVASCULAR: S1, S2 normal. No murmurs, rubs, or gallops.  ABDOMEN: Soft, nontender, nondistended. Bowel sounds present. No organomegaly or mass.  EXTREMITIES: No pedal edema, cyanosis, or clubbing. + 2 pedal & radial pulses b/l.   NEUROLOGIC: Left motor strength decreased PSYCHIATRIC: The patient is alert and awake. Flat affect. SKIN: No obvious rash, lesion, or ulcer.   LABORATORY PANEL:   CBC  Recent Labs Lab 04/27/16 0625  WBC 11.0  HGB 11.1*  HCT 33.3*  PLT 391    ------------------------------------------------------------------------------------------------------------------  Chemistries   Recent Labs Lab 04/27/16 0625  NA 133*  K 3.7  CL 95*  CO2 33*  GLUCOSE 197*  BUN 11  CREATININE 0.58  CALCIUM 8.0*  AST 23  ALT 21  ALKPHOS 73  BILITOT 0.7   ------------------------------------------------------------------------------------------------------------------  Cardiac Enzymes No results for input(s): TROPONINI in the last 168 hours. ------------------------------------------------------------------------------------------------------------------  RADIOLOGY:  No results found.   IMPRESSION AND PLAN:   * Melena with bright red blood per rectum Concern for upper GI bleed. Admit to inpatient.  Nothing by mouth. Start IV Protonix. Check hemoglobin later today evening and tomorrow morning. Discussed with Dr. Lars Pinks of GI. Acute blood loss anemia due to GI bleed. Monitor. Transfuse if less than 7. Hold aspirin and Plavix due to acute bleed.  * Diabetes mellitus. Reduce Lantus dose to half. Start sliding scale insulin every 4 hours.  * History of CVA. Hold aspirin and Plavix due to GI bleed. Continue statin.  * DVT prophylaxis with SCDs.  All the records are reviewed and case discussed with ED provider. Management plans discussed with the patient, family and they are in agreement.  CODE STATUS: FULL  TOTAL TIME TAKING CARE OF THIS PATIENT: 40 minutes.   Milagros Loll R M.D on 04/27/2016 at 8:59 AM  Between 7am to 6pm - Pager - [phone]  After 6pm go to [email or web address] - password [email or web address] [facility]  [facility] [place] [facility]  Office  [phone]  CC: Primary care physician; Elita Boone, MD  Note: This dictation was prepared with Dragon dictation along with smaller phrase technology. Any transcriptional errors that result from this process are unintentional.

## 2016-04-27 NOTE — Progress Notes (Signed)
Report to lauren on floor. Pt s/p UPPER ENDOSCOPY. . PT FOR COLONOSCOPY TOMORROW.

## 2016-04-27 NOTE — Progress Notes (Signed)
Pt was admitted to the floor. During assessment a stage II pressure ulcer was present, dressing was applied and wound care nurse was consulted. VSS. Pt is alert to self, husband at the bedside.   Eswin Worrell [employer]

## 2016-04-27 NOTE — ED Notes (Signed)
Turned.  Skin cleansed.  Changed.  Incontinent moderate amount loose bloody / red brown stool.  Small blot clots seen.  Patient tolerated well.

## 2016-04-27 NOTE — NC FL2 (Signed)
MEDICAID FL2 LEVEL OF CARE SCREENING TOOL     IDENTIFICATION  Patient Name: Molly [name] Birthdate: 1946/01/12 Sex: female Admission Date (Current Location): 04/27/2016  [facility] [place]  [place]   Facility and Address:  [facility], [address], [place], [place] [address]      Provider Number: [id number]  Attending Physician Name and Address:  Milagros LollSrikar Sudini, MD  Relative Name and Phone Number:       Current Level of Care: Hospital Recommended Level of Care: Skilled Nursing Facility Prior Approval Number:    Date Approved/Denied:   PASRR Number:    Discharge Plan: SNF    Current Diagnoses: Patient Active Problem List   Diagnosis Date Noted  . Acute GI bleeding 04/27/2016  . Anemia, posthemorrhagic, acute   . Blood in stool   . Gastritis without bleeding   . Involuntary movements   . Acute respiratory failure (HCC) 03/05/2016  . Acute encephalopathy 02/06/2016  . Acute cerebrovascular accident (CVA) (HCC)   . Altered mental status   . 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 Cellulitis of left lower extremity 11/19/2014  . COPD (chronic obstructive pulmonary disease) (HCC) 11/19/2014  . HTN (hypertension) 11/19/2014  . HLD (hyperlipidemia) 11/19/2014  . Diabetes mellitus (HCC) 05/21/2014  . Chronic anemia 05/21/2014    Orientation RESPIRATION BLADDER Height & Weight     Self  Normal Incontinent Weight: 230 lb (104.3 kg) Height:  5\' 5"  (165.1 cm)  BEHAVIORAL SYMPTOMS/MOOD NEUROLOGICAL BOWEL NUTRITION STATUS   (none)  (none) Incontinent    AMBULATORY STATUS COMMUNICATION OF NEEDS Skin   Total Care Verbally Normal                       Personal Care Assistance Level of Assistance  Total care           Functional Limitations Info  Sight Sight Info: Impaired        SPECIAL CARE FACTORS FREQUENCY  PT (By licensed PT)                    Contractures Contractures Info: Not present     Additional Factors Info  Allergies Code Status Info: full Allergies Info: nka           Current Medications (04/27/2016):  This is the current hospital active medication list Current Facility-Administered Medications  Medication Dose Route Frequency Provider Last Rate Last Dose  . 0.9 % NaCl with KCl 20 mEq/ L  infusion   Intravenous Continuous Milagros LollSrikar Sudini, MD 75 mL/hr at 04/27/16 1416    . acetaminophen (TYLENOL) tablet 650 mg  650 mg Oral Q6H PRN Milagros LollSrikar Sudini, MD       Or  . acetaminophen (TYLENOL) suppository 650 mg  650 mg Rectal Q6H PRN Srikar Sudini, MD      . albuterol (PROVENTIL) (2.5 MG/3ML) 0.083% nebulizer solution 2.5 mg  2.5 mg Nebulization Q2H PRN Srikar Sudini, MD      . atorvastatin (LIPITOR) tablet 40 mg  40 mg Oral q1800 Srikar Sudini, MD      . Melene Muller[START ON 04/28/2016] citalopram (CELEXA) tablet 10 mg  10 mg Oral Daily Srikar Sudini, MD      . Melene Muller[START ON 04/28/2016] diazepam (VALIUM) tablet 2 mg  2 mg Oral Daily Srikar Sudini, MD      . fentaNYL (SUBLIMAZE) 100 MCG/2ML injection           . insulin aspart (novoLOG) injection 0-9  Units  0-9 Units Subcutaneous Q4H Srikar Sudini, MD      . insulin detemir (LEVEMIR) injection 5 Units  5 Units Subcutaneous QHS Srikar Sudini, MD      . magnesium oxide (MAG-OX) tablet 400 mg  400 mg Oral BID Milagros Loll, MD   400 mg at 04/27/16 1416  . meclizine (ANTIVERT) tablet 25 mg  25 mg Oral TID PRN Milagros Loll, MD      . midazolam (VERSED) 5 MG/5ML injection           . pantoprazole (PROTONIX) injection 40 mg  40 mg Intravenous Q12H Srikar Sudini, MD   40 mg at 04/27/16 1416  . polyethylene glycol (MIRALAX / GLYCOLAX) packet 17 g  17 g Oral Daily PRN Milagros Loll, MD      . Melene Muller ON 04/28/2016] polyethylene glycol (MIRALAX / GLYCOLAX) packet 17 g  17 g Oral Daily Milagros Loll, MD         Discharge Medications: Please see discharge summary for a list of discharge medications.  Relevant Imaging Results:  Relevant Lab  Results:   Additional Information    York Spaniel, LCSW

## 2016-04-27 NOTE — Progress Notes (Signed)
The Nurse notified the [facility] of the Pt in Ed02. CH met the Pt, who was with husband beside. Pt stated she has a bleeding condition and was waiting for a room. [place] talked with Pt and husband for a while before the Nurses came in to clean the Pt. CH told the Pt and husband that he is available if needed for a further follow-up. Pt appeared to be in good spirit.     04/27/16 1000  Clinical Encounter Type  Visited With Patient;Family  Visit Type Initial;Spiritual support;Other (Comment)  Referral From Nurse  Consult/Referral To Chaplain  Spiritual Encounters  Spiritual Needs Prayer;Other (Comment)

## 2016-04-27 NOTE — ED Triage Notes (Signed)
[facility] report [facility] blood in pt's diaper. Unsure if from rectum or vagina. Pt checked and bright red blood noted in diaper and is coming from rectum.

## 2016-04-27 NOTE — ED Provider Notes (Signed)
[facility] Emergency Department Provider Note   ____________________________________________    I have reviewed the triage vital signs and the nursing notes.   HISTORY  Chief Complaint Rectal Bleeding     HPI Molly [name] is a 71 y.o. female who was sent from nursing home because of blood in her diaper. She denies abdominal pain. Patient is unable to provide significant history. She has no real complaints. When asked if she is on blood thinners she says she is on ibuprofen. Review of medical records shows that the patient is apparently on Plavix and aspirin but no other blood thinners.    Past Medical History:  Diagnosis Date  . Anxiety   . COPD (chronic obstructive pulmonary disease) (HCC)   . Diabetes (HCC)   . Hemorrhoid   . HLD (hyperlipidemia)   . HTN (hypertension)   . Stroke [facility]) 2008   left weakness    Patient Active Problem List   Diagnosis Date Noted  . Involuntary movements   . Acute respiratory failure (HCC) 03/05/2016  . Acute encephalopathy 02/06/2016  . Acute cerebrovascular accident (CVA) (HCC)   . Altered mental status   . 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 Cellulitis of left lower extremity 11/19/2014  . COPD (chronic obstructive pulmonary disease) (HCC) 11/19/2014  . HTN (hypertension) 11/19/2014  . HLD (hyperlipidemia) 11/19/2014  . Diabetes mellitus (HCC) 05/21/2014  . Chronic anemia 05/21/2014    Past Surgical History:  Procedure Laterality Date  . AMPUTATION Left 05/22/2014   Procedure: AMPUTATION RAY LEFT FOURTH AND FIFTH ;  Surgeon: Toni Arthurs, MD;  Location: [facility] OR;  Service: Orthopedics;  Laterality: Left;  . CATARACT EXTRACTION    . COLONOSCOPY  2004    Prior to Admission medications   Medication Sig Start Date End Date Taking? Authorizing Provider  acetaminophen (TYLENOL) 325 MG tablet Take 650 mg by mouth every 6 (six) hours as needed.    Historical Provider, MD  Ascorbic Acid (VITAMIN C) 1000 MG tablet  Take 1,000 mg by mouth daily.    Historical Provider, MD  aspirin EC 81 MG tablet Take 81 mg by mouth daily.    Historical Provider, MD  atorvastatin (LIPITOR) 40 MG tablet Take 1 tablet (40 mg total) by mouth daily at 6 PM. 02/03/16   Enid Baas, MD  B Complex-C (SUPER B COMPLEX PO) Take 1 tablet by mouth daily.     Historical Provider, MD  bisacodyl (DULCOLAX) 5 MG EC tablet Take 1 tablet (5 mg total) by mouth daily as needed for moderate constipation. 01/16/16   Ramonita Lab, MD  calcium carbonate (OS-CAL) 600 MG TABS tablet Take 600 mg by mouth daily.    Historical Provider, MD  Cholecalciferol (VITAMIN D3) 1000 UNITS CAPS Take 1,000 Units by mouth daily.    Historical Provider, MD  clopidogrel (PLAVIX) 75 MG tablet Take 75 mg by mouth daily.    Historical Provider, MD  Coenzyme Q10 (COQ10 PO) Take 1 capsule by mouth daily.     Historical Provider, MD  diazepam (VALIUM) 2 MG tablet Take 1 tablet (2 mg total) by mouth daily. 03/12/16   Adrian Saran, MD  insulin detemir (LEVEMIR) 100 UNIT/ML injection Inject 0.1 mLs (10 Units total) into the skin at bedtime. 03/12/16   Sital Mody, MD  insulin lispro (HUMALOG) 100 UNIT/ML injection Inject into the skin once. Sliding scale 0-99=0 units 100-149=2 units 150-199=3 units 200-249=4 units 250-299=5 units 300-349=6 units 350-399= 7 units 400-449=8 units  Historical Provider, MD  losartan (COZAAR) 50 MG tablet Take 50 mg by mouth daily.    Historical Provider, MD  magnesium oxide (MAG-OX) 400 MG tablet Take 400 mg by mouth 2 (two) times daily.    Historical Provider, MD  meclizine (ANTIVERT) 25 MG tablet Take 1 tablet (25 mg total) by mouth 3 (three) times daily. 01/16/16   Ramonita Lab, MD  Melatonin 10 MG TBDP Take 10 mg by mouth at bedtime.    Historical Provider, MD  Multiple Vitamins-Minerals (OCUVITE PO) Take 1 capsule by mouth daily.     Historical Provider, MD  Omega-3 Fatty Acids (FISH OIL) 1000 MG CPDR Take 1,200 mg by mouth daily.     Historical Provider, MD  omeprazole (PRILOSEC) 20 MG capsule Take 20 mg by mouth daily.    Historical Provider, MD  polyethylene glycol (MIRALAX / GLYCOLAX) packet Take 17 g by mouth daily.    Historical Provider, MD  senna (SENOKOT) 8.6 MG TABS tablet Take 1 tablet by mouth 2 (two) times daily.    Historical Provider, MD  triamcinolone cream (KENALOG) 0.1 % Apply 1 application topically 2 (two) times daily.    Historical Provider, MD  vitamin E 400 UNIT capsule Take 400 Units by mouth daily.    Historical Provider, MD     Allergies Patient has no known allergies.  Family History  Problem Relation Age of Onset  . Hyperlipidemia Mother   . Varicose Veins Mother   . Deep vein thrombosis Father   . Stroke Father   . Breast cancer Paternal Aunt   . Stroke Maternal Grandmother   . Stroke Paternal Grandmother     Social History Social History  Substance Use Topics  . Smoking status: Former Smoker    Packs/day: 0.50    Years: 2.00    Types: Cigarettes    Quit date: 11/18/1968  . Smokeless tobacco: Never Used  . Alcohol use No     Comment: seldom    Review of Systems  Constitutional: Denies fevers   Cardiovascular: Denies chest pain. Respiratory: Denies shortness of breath. Gastrointestinal: Denies abdominal pain  Genitourinary: Catheter Musculoskeletal: Chronic back pain Skin: Negative for rash. Neurological: No new weakness reported  10-point ROS otherwise negative.  ____________________________________________   PHYSICAL EXAM:  VITAL SIGNS: ED Triage Vitals  Enc Vitals Group     BP 04/27/16 0619 103/74     Pulse Rate 04/27/16 0619 (!) 102     Resp 04/27/16 0619 18     Temp 04/27/16 0619 98.3 F (36.8 C)     Temp Source 04/27/16 0619 Oral     SpO2 04/27/16 0619 97 %     Weight 04/27/16 0620 230 lb (104.3 kg)     Height 04/27/16 0620 5\' 5"  (1.651 m)     Head Circumference --      Peak Flow --      Pain Score --      Pain Loc --      Pain Edu? --       Excl. in GC? --     Constitutional: Alert and Appears to be oriented. No acute distress. Pleasant and interactive Eyes: Conjunctivae are normal.  Head: Atraumatic. Nose: No congestion/rhinnorhea. Mouth/Throat: Mucous membranes are moist.    Cardiovascular: Mild tachycardia, regular rhythm. Grossly normal heart sounds.  Good peripheral circulation. Respiratory: Normal respiratory effort.  No retractions. Lungs CTAB. Gastrointestinal: Soft and nontender. No distention.  No CVA tenderness. Positive melena in diaper  Musculoskeletal: .  Warm and well perfused Neurologic:  Normal speech and language.  Skin:  Skin is warm, dry and intact. No rash noted. Psychiatric: Mood and affect are normal. Speech and behavior are normal.  ____________________________________________   LABS (all labs ordered are listed, but only abnormal results are displayed)  Labs Reviewed  COMPREHENSIVE METABOLIC PANEL - Abnormal; Notable for the following:       Result Value   Sodium 133 (*)    Chloride 95 (*)    CO2 33 (*)    Glucose, Bld 197 (*)    Calcium 8.0 (*)    Total Protein 6.2 (*)    Albumin 1.9 (*)    All other components within normal limits  CBC - Abnormal; Notable for the following:    Hemoglobin 11.1 (*)    HCT 33.3 (*)    RDW 16.5 (*)    All other components within normal limits  POC OCCULT BLOOD, ED  TYPE AND SCREEN   ____________________________________________  EKG  ED ECG REPORT I, Jene EveryKINNER, Rhythm Gubbels, the attending physician, personally viewed and interpreted this ECG.  Date: 04/27/2016 EKG Time: 6:28 AM Rate: 106 Rhythm: Sinus tachycardia QRS Axis: normal Intervals: normal ST/T Wave abnormalities: normal Conduction Disturbances: Right bundle branch block   ____________________________________________  RADIOLOGY  None ____________________________________________   PROCEDURES  Procedure(s) performed: No    Critical Care performed:  No ____________________________________________   INITIAL IMPRESSION / ASSESSMENT AND PLAN / ED COURSE  Pertinent labs & imaging results that were available during my care of the patient were reviewed by me and considered in my medical decision making (see chart for details).  Patient presents with rectal bleeding. Stool is melanotic. No abdominal tenderness to palpation. Vitals are stable. Mild tachycardia. In comparison to admission one month ago hemoglobin has dropped approximately 1-2 g, she will require admission    ____________________________________________   FINAL CLINICAL IMPRESSION(S) / ED DIAGNOSES  Final diagnoses:  Acute GI bleeding      NEW MEDICATIONS STARTED DURING THIS VISIT:  New Prescriptions   No medications on file     Note:  This document was prepared using Dragon voice recognition software and may include unintentional dictation errors.    Jene Everyobert Elysia Grand, MD 04/27/16 [phone]

## 2016-04-27 NOTE — Progress Notes (Signed)
Verbal order from Dr. Elpidio AnisSudini to remove foley. [facility]   Molly [name] [place]

## 2016-04-27 NOTE — Progress Notes (Signed)
Foley catheter was D/C received 800 ml in catheter bag. Pt is due to void by 0000 04/28/2016.   Adrik Khim [place]

## 2016-04-28 ENCOUNTER — Encounter: Payer: Self-pay | Admitting: Gastroenterology

## 2016-04-28 ENCOUNTER — Encounter
Admission: EM | Disposition: A | Discharge status: Skilled Nursing Facility | Payer: Self-pay | Source: Home / Self Care | Attending: Internal Medicine

## 2016-04-28 DIAGNOSIS — K625 Hemorrhage of anus and rectum: Secondary | ICD-10-CM | POA: Diagnosis not present

## 2016-04-28 DIAGNOSIS — L89152 Pressure ulcer of sacral region, stage 2: Secondary | ICD-10-CM | POA: Insufficient documentation

## 2016-04-28 DIAGNOSIS — L8990 Pressure ulcer of unspecified site, unspecified stage: Secondary | ICD-10-CM | POA: Insufficient documentation

## 2016-04-28 DIAGNOSIS — K922 Gastrointestinal hemorrhage, unspecified: Secondary | ICD-10-CM | POA: Diagnosis not present

## 2016-04-28 LAB — CBC
HEMATOCRIT: 30.7 % — AB (ref 35.0–47.0)
HEMOGLOBIN: 10.2 g/dL — AB (ref 12.0–16.0)
MCH: 28.1 pg (ref 26.0–34.0)
MCHC: 33.1 g/dL (ref 32.0–36.0)
MCV: 84.9 fL (ref 80.0–100.0)
Platelets: 388 10*3/uL (ref 150–440)
RBC: 3.62 MIL/uL — ABNORMAL LOW (ref 3.80–5.20)
RDW: 16.3 % — AB (ref 11.5–14.5)
WBC: 11.4 10*3/uL — AB (ref 3.6–11.0)

## 2016-04-28 LAB — GLUCOSE, CAPILLARY
GLUCOSE-CAPILLARY: 167 mg/dL — AB (ref 65–99)
GLUCOSE-CAPILLARY: 154 mg/dL — AB (ref 65–99)
Glucose-Capillary: 131 mg/dL — ABNORMAL HIGH (ref 65–99)
Glucose-Capillary: 143 mg/dL — ABNORMAL HIGH (ref 65–99)
Glucose-Capillary: 155 mg/dL — ABNORMAL HIGH (ref 65–99)
Glucose-Capillary: 260 mg/dL — ABNORMAL HIGH (ref 65–99)

## 2016-04-28 LAB — BASIC METABOLIC PANEL
Anion gap: 5 (ref 5–15)
BUN: 11 mg/dL (ref 6–20)
CO2: 30 mmol/L (ref 22–32)
Calcium: 7.8 mg/dL — ABNORMAL LOW (ref 8.9–10.3)
Chloride: 100 mmol/L — ABNORMAL LOW (ref 101–111)
Creatinine, Ser: 0.56 mg/dL (ref 0.44–1.00)
GFR calc Af Amer: >60 mL/min (ref >60)
GLUCOSE: 163 mg/dL — AB (ref 65–99)
POTASSIUM: 4.4 mmol/L (ref 3.5–5.1)
Sodium: 135 mmol/L (ref 135–145)

## 2016-04-28 SURGERY — COLONOSCOPY WITH PROPOFOL
Anesthesia: Monitor Anesthesia Care

## 2016-04-28 MED ORDER — PEG 3350-KCL-NA BICARB-NACL 420 G PO SOLR
2000.0000 mL | Freq: Once | ORAL | Status: AC
  Administered 2016-04-28: 2000 mL via ORAL
  Filled 2016-04-28: qty 4000

## 2016-04-28 MED ORDER — PEG 3350-KCL-NA BICARB-NACL 420 G PO SOLR: 4000.0000 mL | Freq: Once | ORAL | Status: DC

## 2016-04-28 MED ORDER — POLYETHYLENE GLYCOL 3350 17 G PO PACK
17.0000 g | PACK | Freq: Every day | ORAL | Status: DC
  Administered 2016-04-30: 17 g via ORAL
  Filled 2016-04-28 (×2): qty 1

## 2016-04-28 MED ORDER — SODIUM CHLORIDE 0.9 % IV BOLUS (SEPSIS)
1000.0000 mL | Freq: Once | INTRAVENOUS | Status: AC
  Administered 2016-04-28: 1000 mL via INTRAVENOUS

## 2016-04-28 NOTE — Progress Notes (Signed)
Called Dr. Sheryle Hailiamond regarding patient's decreasing blood pressure and continued passage of blood clots.  Appropriate orders were placed.  Arturo MortonClay, Alicja Everitt N  04/28/2016   5:34 AM

## 2016-04-28 NOTE — Consult Note (Signed)
WOC Nurse wound consult note Reason for Consult:Moisture Associated SKin damage from incontinence.  Recent rectal bleeding noted.  Hx CVA with left sided weakness Wound type:Moisture and pressure, partial thickness injury Pressure Injury POA: Yes Measurement:2 cm x 2 cm x 0.1 cm  Wound ZOX:WRUEbed:pink and moist Drainage (amount, consistency, odor) none Periwound:intact Dressing procedure/placement/frequency:Avoid disposable briefs... Clean sacral wound with soap and water.  Barrier cream twice daily and PRN incontinent episodes.  Will not follow at this time.  Please re-consult if needed.  Maple HudsonKaren Kailash Hinze RN BSN CWON Pager [phone]

## 2016-04-28 NOTE — NC FL2 (Signed)
[place] MEDICAID FL2 LEVEL OF CARE SCREENING TOOL     IDENTIFICATION  Patient Name: Molly [name] Birthdate: 1945-06-10 Sex: female Admission Date (Current Location): 04/27/2016  [facility] [place]  [place]   Facility and Address:  [facility], [address], [place], [place] [address]      Provider Number: [id number]  Attending Physician Name and Address:  Milagros LollSrikar Sudini, MD  Relative Name and Phone Number:       Current Level of Care: SNF Recommended Level of Care: Skilled Nursing Facility Prior Approval Number:    Date Approved/Denied:   PASRR Number:    Discharge Plan: SNF    Current Diagnoses: Patient Active Problem List   Diagnosis Date Noted  . Pressure injury of skin 04/28/2016  . Acute GI bleeding 04/27/2016  . Anemia, posthemorrhagic, acute   . Blood in stool   . Gastritis without bleeding   . Involuntary movements   . Acute respiratory failure (HCC) 03/05/2016  . Acute encephalopathy 02/06/2016  . Acute cerebrovascular accident (CVA) (HCC)   . Altered mental status   . 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 Cellulitis of left lower extremity 11/19/2014  . COPD (chronic obstructive pulmonary disease) (HCC) 11/19/2014  . HTN (hypertension) 11/19/2014  . HLD (hyperlipidemia) 11/19/2014  . Diabetes mellitus (HCC) 05/21/2014  . Chronic anemia 05/21/2014    Orientation RESPIRATION BLADDER Height & Weight     Self  Normal Incontinent Weight: 192 lb 4.8 oz (87.2 kg) Height:  5\' 5"  (165.1 cm)  BEHAVIORAL SYMPTOMS/MOOD NEUROLOGICAL BOWEL NUTRITION STATUS   (none)  (none) Incontinent Diet (renal)  AMBULATORY STATUS COMMUNICATION OF NEEDS Skin   Extensive Assist Verbally PU Stage and Appropriate Care                       Personal Care Assistance Level of Assistance  Total care       Total Care Assistance: Maximum assistance   Functional Limitations Info  Hearing Sight Info: Impaired        SPECIAL CARE  FACTORS FREQUENCY   (dialysis)                    Contractures Contractures Info: Not present    Additional Factors Info  Allergies Code Status Info: full Allergies Info: nka           Current Medications (04/28/2016):  This is the current hospital active medication list Current Facility-Administered Medications  Medication Dose Route Frequency Provider Last Rate Last Dose  . 0.9 % NaCl with KCl 20 mEq/ L  infusion   Intravenous Continuous Milagros LollSrikar Sudini, MD 75 mL/hr at 04/28/16 0742    . acetaminophen (TYLENOL) tablet 650 mg  650 mg Oral Q6H PRN Milagros LollSrikar Sudini, MD       Or  . acetaminophen (TYLENOL) suppository 650 mg  650 mg Rectal Q6H PRN Srikar Sudini, MD      . albuterol (PROVENTIL) (2.5 MG/3ML) 0.083% nebulizer solution 2.5 mg  2.5 mg Nebulization Q2H PRN Srikar Sudini, MD      . atorvastatin (LIPITOR) tablet 40 mg  40 mg Oral q1800 Milagros LollSrikar Sudini, MD   40 mg at 04/27/16 1911  . citalopram (CELEXA) tablet 10 mg  10 mg Oral Daily Milagros LollSrikar Sudini, MD   10 mg at 04/28/16 [id number]  . diazepam (VALIUM) tablet 2 mg  2 mg Oral Daily Milagros LollSrikar Sudini, MD   2 mg at 04/28/16 [id number]  . insulin aspart (novoLOG) injection  0-9 Units  0-9 Units Subcutaneous Q4H Milagros Loll, MD   2 Units at 04/28/16 [phone]  . insulin detemir (LEVEMIR) injection 5 Units  5 Units Subcutaneous QHS Milagros Loll, MD   5 Units at 04/27/16 2247  . magnesium oxide (MAG-OX) tablet 400 mg  400 mg Oral BID Milagros Loll, MD   400 mg at 04/28/16 0937  . meclizine (ANTIVERT) tablet 25 mg  25 mg Oral TID PRN Milagros Loll, MD      . pantoprazole (PROTONIX) injection 40 mg  40 mg Intravenous Q12H Milagros Loll, MD   40 mg at 04/28/16 0937  . polyethylene glycol (MIRALAX / GLYCOLAX) packet 17 g  17 g Oral Daily PRN Srikar Sudini, MD      . polyethylene glycol (MIRALAX / GLYCOLAX) packet 17 g  17 g Oral Daily Milagros Loll, MD         Discharge Medications: Please see discharge summary for a list of discharge  medications.  Relevant Imaging Results:  Relevant Lab Results:   Additional Information    York Spaniel, LCSW

## 2016-04-28 NOTE — Progress Notes (Signed)
She had not completed the prep this morning . Prep colon for colonoscopy tomorrow. Scheduled at 1pm on my list   Dr Wyline MoodKiran Jerrico Covello  Gastroenterology/Hepatology Pager: [phone]

## 2016-04-28 NOTE — Plan of Care (Signed)
Problem: Education: Goal: Knowledge of [facility] Education information/materials will improve Outcome: Not Progressing Patient is confused at times.  Problem: Activity: Goal: Risk for activity intolerance will decrease Outcome: Not Progressing Patient has not ambulated.

## 2016-04-28 NOTE — Progress Notes (Signed)
Called Dr. Anne HahnWillis regarding patient passing blood clots.  Doctor ordered blood work to be done sooner.  Molly AlstromClay, Molly [name]  04/28/2016  1:17 AM

## 2016-04-28 NOTE — Progress Notes (Signed)
Initial Nutrition Assessment  DOCUMENTATION CODES:   Obesity unspecified  INTERVENTION:  -Recommend addition of nutritional supplement once diet advanced  NUTRITION DIAGNOSIS:   Inadequate oral intake related to acute illness as evidenced by NPO status.  GOAL:   Patient will meet greater than or equal to 90% of their needs   MONITOR:   Diet advancement, PO intake, Labs, Weight trends  REASON FOR ASSESSMENT:   Malnutrition Screening Tool    ASSESSMENT:    71 yo female admitted with rectal bleeding; Pt with hx of CVA, HTN, COPD  Left sided weakness present from recent stroke (November 2017)  Pt passing blood clots throughout the night EGD with gastritis only, no signs of bleeding Plan for colonoscopy today  Pt is alert but confused Pt reports appetite is not a problem, reports appetite has been good. If current wt is correct, pt with >20% wt loss since November 2017.   Nutrition-Focused physical exam completed. Findings are WDL for fat depletion, muscle depletion, and edema.   Labs: reviewed Meds: NS with KCL at 75 ml/hr, ss novolog, levemir  Diet Order:  Diet NPO time specified  Skin:  Wound (see comment) (stage II buttock, unstageable heel)  Last BM:  04/27/16  Height:   Ht Readings from Last 1 Encounters:  04/27/16 5\' 5"  (1.651 m)    Weight:   Wt Readings from Last 1 Encounters:  04/28/16 192 lb 4.8 oz (87.2 kg)    BMI:  Body mass index is 32 kg/m.  Estimated Nutritional Needs:   Kcal:  1700-1900 kcals  Protein:  85-95 g  Fluid:  >/= 1.7 L  EDUCATION NEEDS:   No education needs identified at this time  Romelle StarcherCate Pate Aylward MS, RD, LDN [phone] Pager  [phone] Weekend/On-Call Pager

## 2016-04-28 NOTE — Clinical Social Work Note (Signed)
Clinical Social Work Assessment  Patient Details  Name: Molly [name] MRN: [id number] Date of Birth: 05/05/1945  Date of referral:  04/28/16               Reason for consult:  Facility Placement                Permission sought to share information with:    Permission granted to share information::     Name::        Agency::     Relationship::     Contact Information:     Housing/Transportation Living arrangements for the past 2 months:  [place] of Information:  Spouse Patient Interpreter Needed:  None Criminal Activity/Legal Involvement Pertinent to Current Situation/Hospitalization:  No - Comment as needed Significant Relationships:  None Lives with:  Facility Resident Do you feel safe going back to the place where you live?  Yes Need for family participation in patient care:  Yes (Comment)  Care giving concerns:  Patient is a long term resident of [facility].    Social Worker assessment / plan:  CSW met with patient's husband in patient's hospital room. Patient was sleeping and did not awaken during my conversation with her husband. Patient's husband reports patient has been at [facility] since last October. He stated that the intent was for short term rehab but then she had 2 more strokes and had to remain a resident. Patient's husband stated that he wishes for her to return at discharge. Patient's husband spoke of how patient has been making some progress over the past couple of weeks and is looking a little better.  CSW spoke with Hilda Blades at [facility] and patient has no more insurance days left and will be returning under private pay which she has been for the past couple of weeks.   Employment status:  Retired Nurse, adult PT Recommendations:    Information / Referral to community resources:     Patient/Family's Response to care:  Patient's husband expressed appreciation for CSW visit.   Patient/Family's  Understanding of and Emotional Response to Diagnosis, Current Treatment, and Prognosis:  Patient's husband stated that it has been a long road for his wife and that he is hoping she will continue to make gradual improvements.   Emotional Assessment Appearance:  Appears stated age Attitude/Demeanor/Rapport:   (sleeping soundly at time of assessment) Affect (typically observed):   (sleeping soundly at time of assessment) Orientation:  Oriented to Self Alcohol / Substance use:  Not Applicable Psych involvement (Current and /or in the community):  No (Comment)  Discharge Needs  Concerns to be addressed:  Care Coordination Readmission within the last 30 days:  No Current discharge risk:  None Barriers to Discharge:  No Barriers Identified   Shela Leff, LCSW 04/28/2016, 3:39 PM

## 2016-04-28 NOTE — Progress Notes (Signed)
[facility] - [place] at [facility]   PATIENT NAME: Molly DustmanBrenda [name]    MR#:  [id number]  DATE OF BIRTH:  1945/12/07  SUBJECTIVE:  CHIEF COMPLAINT:   Chief Complaint  Patient presents with  . Rectal Bleeding   Still has some blood and clots in her stool. No abdominal pain.  REVIEW OF SYSTEMS:    Review of Systems  Constitutional: Positive for malaise/fatigue. Negative for chills and fever.  HENT: Negative for sore throat.   Eyes: Negative for blurred vision, double vision and pain.  Respiratory: Negative for cough, hemoptysis, shortness of breath and wheezing.   Cardiovascular: Negative for chest pain, palpitations, orthopnea and leg swelling.  Gastrointestinal: Positive for blood in stool. Negative for abdominal pain, constipation, diarrhea, heartburn, nausea and vomiting.  Genitourinary: Negative for dysuria and hematuria.  Musculoskeletal: Negative for back pain and joint pain.  Skin: Negative for rash.  Neurological: Positive for weakness. Negative for sensory change, speech change, focal weakness and headaches.  Endo/Heme/Allergies: Does not bruise/bleed easily.  Psychiatric/Behavioral: Negative for depression. The patient is not nervous/anxious.     DRUG ALLERGIES:  No Known Allergies  VITALS:  Blood pressure 93/60, pulse (!) 124, temperature 98.1 F (36.7 C), temperature source Oral, resp. rate 16, height 5\' 5"  (1.651 m), weight 87.2 kg (192 lb 4.8 oz), SpO2 100 %.  PHYSICAL EXAMINATION:   Physical Exam  GENERAL:  71 y.o.-year-old patient lying in the bed with no acute distress.  EYES: Pupils equal, round, reactive to light and accommodation. No scleral icterus. Extraocular muscles intact.  HEENT: Head atraumatic, normocephalic. Oropharynx and nasopharynx clear.  NECK:  Supple, no jugular venous distention. No thyroid enlargement, no tenderness.  LUNGS: Normal breath sounds bilaterally, no wheezing, rales, rhonchi. No use of accessory muscles of  respiration.  CARDIOVASCULAR: S1, S2 normal. No murmurs, rubs, or gallops.  ABDOMEN: Soft, nontender, nondistended. Bowel sounds present. No organomegaly or mass.  EXTREMITIES: No cyanosis, clubbing or edema b/l.    NEUROLOGIC: left hemiparesis   PSYCHIATRIC: The patient is alert and awake SKIN: No obvious rash, lesion, or ulcer.   LABORATORY PANEL:   CBC  Recent Labs Lab 04/28/16 0126  WBC 11.4*  HGB 10.2*  HCT 30.7*  PLT 388   ------------------------------------------------------------------------------------------------------------------ Chemistries   Recent Labs Lab 04/27/16 0625 04/28/16 0126  NA 133* 135  K 3.7 4.4  CL 95* 100*  CO2 33* 30  GLUCOSE 197* 163*  BUN 11 11  CREATININE 0.58 0.56  CALCIUM 8.0* 7.8*  AST 23  --   ALT 21  --   ALKPHOS 73  --   BILITOT 0.7  --    ------------------------------------------------------------------------------------------------------------------  Cardiac Enzymes No results for input(s): TROPONINI in the last 168 hours. ------------------------------------------------------------------------------------------------------------------  RADIOLOGY:  No results found.   ASSESSMENT AND PLAN:   * Melena with bright red blood per rectum Concern for upper GI bleed but EGD showed only mild gastritis.. No bleeding. Scheduled for colonoscopy but patient unable to complete her GoLYTELY prep. Colonoscopy canceled.Scheduled again for tomorrow. Acute blood loss anemia due to GI bleed. Monitor. Transfuse if less than 7. Hold aspirin and Plavix due to acute bleed. Still has some blood clots in her stool.  * Diabetes mellitus. Reduce Lantus dose to half. Start sliding scale insulin every 4 hours.  * History of CVA. Hold aspirin and Plavix due to GI bleed. Continue statin.  * DVT prophylaxis with SCDs.  All the records are reviewed and case discussed with Care Management/Social  Workerr. Management plans discussed with the  patient, family and they are in agreement.  CODE STATUS: FULL CODE  DVT Prophylaxis: SCDs  TOTAL TIME TAKING CARE OF THIS PATIENT: 35 minutes.   POSSIBLE D/C IN 1-2 DAYS, DEPENDING ON CLINICAL CONDITION.  Milagros Loll R M.D on 04/28/2016 at 2:13 PM  Between 7am to 6pm - Pager - [phone]  After 6pm go to [email or web address] - password [email or web address] [facility]  [facility] [place] [facility]  Office  [phone]  CC: Primary care physician; Elita Boone, MD  Note: This dictation was prepared with Dragon dictation along with smaller phrase technology. Any transcriptional errors that result from this process are unintentional.

## 2016-04-29 ENCOUNTER — Encounter: Payer: Self-pay | Admitting: *Deleted

## 2016-04-29 ENCOUNTER — Inpatient Hospital Stay: Payer: Medicare Other | Admitting: Anesthesiology

## 2016-04-29 ENCOUNTER — Encounter
Admission: EM | Disposition: A | Discharge status: Skilled Nursing Facility | Payer: Self-pay | Source: Home / Self Care | Attending: Internal Medicine

## 2016-04-29 DIAGNOSIS — K922 Gastrointestinal hemorrhage, unspecified: Secondary | ICD-10-CM | POA: Diagnosis not present

## 2016-04-29 DIAGNOSIS — D62 Acute posthemorrhagic anemia: Secondary | ICD-10-CM

## 2016-04-29 DIAGNOSIS — K625 Hemorrhage of anus and rectum: Secondary | ICD-10-CM | POA: Diagnosis not present

## 2016-04-29 HISTORY — PX: COLONOSCOPY WITH PROPOFOL: SHX5780

## 2016-04-29 LAB — HEMOGLOBIN AND HEMATOCRIT, BLOOD
HEMATOCRIT: 24.3 % — AB (ref 35.0–47.0)
Hemoglobin: 8.2 g/dL — ABNORMAL LOW (ref 12.0–16.0)

## 2016-04-29 LAB — HEMOGLOBIN: Hemoglobin: 7.5 g/dL — ABNORMAL LOW (ref 12.0–16.0)

## 2016-04-29 LAB — GLUCOSE, CAPILLARY
GLUCOSE-CAPILLARY: 107 mg/dL — AB (ref 65–99)
GLUCOSE-CAPILLARY: 98 mg/dL (ref 65–99)
Glucose-Capillary: 103 mg/dL — ABNORMAL HIGH (ref 65–99)
Glucose-Capillary: 111 mg/dL — ABNORMAL HIGH (ref 65–99)
Glucose-Capillary: 134 mg/dL — ABNORMAL HIGH (ref 65–99)
Glucose-Capillary: 175 mg/dL — ABNORMAL HIGH (ref 65–99)

## 2016-04-29 LAB — PREPARE RBC (CROSSMATCH)

## 2016-04-29 LAB — OCCULT BLOOD X 1 CARD TO LAB, STOOL: Fecal Occult Bld: POSITIVE — AB

## 2016-04-29 SURGERY — COLONOSCOPY WITH PROPOFOL
Anesthesia: General

## 2016-04-29 MED ORDER — PHENYLEPHRINE HCL 10 MG/ML IJ SOLN
INTRAMUSCULAR | Status: DC | PRN
  Administered 2016-04-29: 100 ug via INTRAVENOUS

## 2016-04-29 MED ORDER — MESALAMINE 4 G RE ENEM
4.0000 g | ENEMA | Freq: Every day | RECTAL | Status: DC
  Administered 2016-04-29: 4 g via RECTAL
  Filled 2016-04-29 (×2): qty 60

## 2016-04-29 MED ORDER — LIDOCAINE HCL (PF) 2 % IJ SOLN
INTRAMUSCULAR | Status: AC
  Filled 2016-04-29: qty 2

## 2016-04-29 MED ORDER — SODIUM CHLORIDE 0.9 % IV SOLN
Freq: Once | INTRAVENOUS | Status: AC
  Administered 2016-04-29: 11:00:00 via INTRAVENOUS

## 2016-04-29 MED ORDER — SODIUM CHLORIDE 0.9 % IV SOLN
INTRAVENOUS | Status: DC
  Administered 2016-04-29: 1000 mL via INTRAVENOUS

## 2016-04-29 MED ORDER — PROPOFOL 10 MG/ML IV BOLUS
INTRAVENOUS | Status: DC | PRN
  Administered 2016-04-29: 10 mg via INTRAVENOUS
  Administered 2016-04-29: 40 mg via INTRAVENOUS

## 2016-04-29 MED ORDER — PROPOFOL 500 MG/50ML IV EMUL
INTRAVENOUS | Status: DC | PRN
  Administered 2016-04-29: 100 ug/kg/min via INTRAVENOUS

## 2016-04-29 MED ORDER — PROPOFOL 500 MG/50ML IV EMUL
INTRAVENOUS | Status: AC
  Filled 2016-04-29: qty 50

## 2016-04-29 MED ORDER — MESALAMINE 1.2 G PO TBEC
4.8000 g | DELAYED_RELEASE_TABLET | Freq: Every day | ORAL | Status: DC
  Administered 2016-04-30: 4.8 g via ORAL
  Filled 2016-04-29 (×2): qty 4

## 2016-04-29 MED ORDER — LIDOCAINE HCL (PF) 2 % IJ SOLN
INTRAMUSCULAR | Status: DC | PRN
  Administered 2016-04-29: 100 mg via INTRADERMAL

## 2016-04-29 NOTE — H&P (Signed)
Wyline Mood MD [address], [address] [place], [place] [address] Phone: [phone] Fax : [phone]  Primary Care Physician:  Elita Boone, MD Primary Gastroenterologist:  Dr. Wyline Mood   Pre-Procedure History & Physical: HPI:  Molly [name] is a 71 y.o. female is here for an colonoscopy.   Past Medical History:  Diagnosis Date  . Anxiety   . COPD (chronic obstructive pulmonary disease) (HCC)   . Diabetes (HCC)   . Hemorrhoid   . HLD (hyperlipidemia)   . HTN (hypertension)   . Stroke [facility]) 2008   left weakness    Past Surgical History:  Procedure Laterality Date  . AMPUTATION Left 05/22/2014   Procedure: AMPUTATION RAY LEFT FOURTH AND FIFTH ;  Surgeon: Toni Arthurs, MD;  Location: [facility] OR;  Service: Orthopedics;  Laterality: Left;  . CATARACT EXTRACTION    . COLONOSCOPY  2004  . ESOPHAGOGASTRODUODENOSCOPY (EGD) WITH PROPOFOL N/A 04/27/2016   Procedure: ESOPHAGOGASTRODUODENOSCOPY (EGD) WITH PROPOFOL;  Surgeon: Midge Minium, MD;  Location: [facility] ENDOSCOPY;  Service: Endoscopy;  Laterality: N/A;    Prior to Admission medications   Medication Sig Start Date End Date Taking? Authorizing Provider  acetaminophen (TYLENOL) 325 MG tablet Take 650 mg by mouth every 6 (six) hours as needed.   Yes Historical Provider, MD  aspirin EC 81 MG tablet Take 81 mg by mouth daily.   Yes Historical Provider, MD  atorvastatin (LIPITOR) 40 MG tablet Take 1 tablet (40 mg total) by mouth daily at 6 PM. 02/03/16  Yes Enid Baas, MD  B Complex-C (SUPER B COMPLEX PO) Take 1 tablet by mouth daily.    Yes Historical Provider, MD  bisacodyl (DULCOLAX) 5 MG EC tablet Take 1 tablet (5 mg total) by mouth daily as needed for moderate constipation. 01/16/16  Yes Ramonita Lab, MD  calcium carbonate (OS-CAL) 600 MG TABS tablet Take 600 mg by mouth 2 (two) times daily.    Yes Historical Provider, MD  Cholecalciferol (VITAMIN D3) 1000 UNITS CAPS Take 1,000 Units by mouth daily.   Yes Historical  Provider, MD  citalopram (CELEXA) 10 MG tablet Take 10 mg by mouth daily.   Yes Historical Provider, MD  clopidogrel (PLAVIX) 75 MG tablet Take 75 mg by mouth daily.   Yes Historical Provider, MD  diazepam (VALIUM) 2 MG tablet Take 1 tablet (2 mg total) by mouth daily. 03/12/16  Yes Sital Mody, MD  insulin detemir (LEVEMIR) 100 UNIT/ML injection Inject 0.1 mLs (10 Units total) into the skin at bedtime. 03/12/16  Yes Adrian Saran, MD  meclizine (ANTIVERT) 25 MG tablet Take 1 tablet (25 mg total) by mouth 3 (three) times daily. Patient taking differently: Take 25 mg by mouth 3 (three) times daily as needed (vertigo).  01/16/16  Yes Ramonita Lab, MD  Melatonin 10 MG TBDP Take 10 mg by mouth at bedtime.   Yes Historical Provider, MD  omeprazole (PRILOSEC) 20 MG capsule Take 20 mg by mouth daily.   Yes Historical Provider, MD  triamcinolone cream (KENALOG) 0.1 % Apply 1 application topically 2 (two) times daily. Behind right ear   Yes Historical Provider, MD  losartan (COZAAR) 50 MG tablet Take 50 mg by mouth daily.    Historical Provider, MD  magnesium oxide (MAG-OX) 400 MG tablet Take 400 mg by mouth 2 (two) times daily.    Historical Provider, MD  polyethylene glycol (MIRALAX / GLYCOLAX) packet Take 17 g by mouth daily.    Historical Provider, MD  senna (SENOKOT) 8.6 MG  TABS tablet Take 1 tablet by mouth 2 (two) times daily.    Historical Provider, MD    Allergies as of 04/27/2016  . (No Known Allergies)    Family History  Problem Relation Age of Onset  . Hyperlipidemia Mother   . Varicose Veins Mother   . Deep vein thrombosis Father   . Stroke Father   . Breast cancer Paternal Aunt   . Stroke Maternal Grandmother   . Stroke Paternal Grandmother     Social History   Social History  . Marital status: Married    Spouse name: N/A  . Number of children: N/A  . Years of education: N/A   Occupational History  . Not on file.   Social History Main Topics  . Smoking status: Former Smoker     Packs/day: 0.50    Years: 2.00    Types: Cigarettes    Quit date: 11/18/1968  . Smokeless tobacco: Never Used  . Alcohol use No     Comment: seldom  . Drug use: No  . Sexual activity: No   Other Topics Concern  . Not on file   Social History Narrative  . No narrative on file    Review of Systems: See HPI, otherwise negative ROS  Physical Exam: BP (!) 131/32   Pulse 99   Temp 98.1 F (36.7 C) (Tympanic)   Resp 20   Ht 5\' 5"  (1.651 m)   Wt 199 lb 9.6 oz (90.5 kg)   SpO2 100%   BMI 33.22 kg/m  General:   Alert,  pleasant and cooperative in NAD Head:  Normocephalic and atraumatic. Neck:  Supple; no masses or thyromegaly. Lungs:  Clear throughout to auscultation.    Heart:  Regular rate and rhythm. Abdomen:  Soft, nontender and nondistended. Normal bowel sounds, without guarding, and without rebound.   Neurologic:  Alert and  oriented x4;  grossly normal neurologically.  Impression/Plan: Molly [name] is here for an colonoscopy to be performed for rectal bleeding(severe), been on plavix till Sunday.   Risks, benefits, limitations, and alternatives regarding  colonoscopy have been reviewed with the patient.  Questions have been answered.  All parties agreeable.   Wyline MoodKiran Jemal Miskell, MD  04/29/2016, 1:08 PM

## 2016-04-29 NOTE — Anesthesia Procedure Notes (Signed)
Performed by: Casey BurkittHOANG, Kisean Rollo Pre-anesthesia Checklist: Patient identified, Emergency Drugs available, Suction available and Timeout performed Patient Re-evaluated:Patient Re-evaluated prior to inductionOxygen Delivery Method: Nasal cannula Preoxygenation: Pre-oxygenation with 100% oxygen Intubation Type: IV induction

## 2016-04-29 NOTE — Anesthesia Postprocedure Evaluation (Signed)
Anesthesia Post Note  Patient: Molly [name]  Procedure(s) Performed: Procedure(s) (LRB): COLONOSCOPY WITH PROPOFOL (N/A)  Patient location during evaluation: PACU Anesthesia Type: General Level of consciousness: awake Pain management: pain level controlled Vital Signs Assessment: post-procedure vital signs reviewed and stable Respiratory status: spontaneous breathing Cardiovascular status: blood pressure returned to baseline Anesthetic complications: no     Last Vitals:  Vitals:   04/29/16 1413 04/29/16 1423  BP: 136/79 128/72  Pulse: 98 100  Resp: 17 15  Temp:      Last Pain:  Vitals:   04/29/16 1353  TempSrc: Tympanic  PainSc:                  VAN STAVEREN,[name]

## 2016-04-29 NOTE — Op Note (Signed)
[facility] Gastroenterology Patient Name: Molly DustmanBrenda Shear Procedure Date: 04/29/2016 1:13 PM MRN: [id number] Account #: [id number] Date of Birth: 09-22-1945 Admit Type: Outpatient Age: 71 [facility]O ROOM 1 Gender: Female Note Status: Finalized Procedure:            Colonoscopy Indications:          Rectal bleeding Providers:            Wyline MoodKiran Keijuan Schellhase MD, MD Referring MD:         Rolly PancakeVivek J. Cherlynn KaiserSainani, MD (Referring MD) Complications:        No immediate complications. Procedure:            Pre-Anesthesia Assessment:                       - Prior to the procedure, a History and Physical was                        performed, and patient medications, allergies and                        sensitivities were reviewed. The patient's tolerance of                        previous anesthesia was reviewed.                       - The risks and benefits of the procedure and the                        sedation options and risks were discussed with the                        patient. All questions were answered and informed                        consent was obtained.                       - The risks and benefits of the procedure and the                        sedation options and risks were discussed with the                        patient. All questions were answered and informed                        consent was obtained.                       - ASA Grade Assessment: IV - A patient with severe                        systemic disease that is a constant threat to life.                       After obtaining informed consent, the colonoscope was                        passed under direct vision. Throughout the procedure,  the patient's blood pressure, pulse, and oxygen                        saturations were monitored continuously. The                        Colonoscope was introduced through the anus and                        advanced to the the cecum,  identified by the                        appendiceal orifice, IC valve and transillumination.                        The colonoscopy was performed without difficulty. The                        quality of the bowel preparation was poor. Findings:      A large amount of solid stool was found in the entire colon, precluding       visualization.      The mucosa vascular pattern in the rectum was diffusely decreased.      Diffuse severe inflammation characterized by altered vascularity,       congestion (edema), erosions, erythema, friability and loss of       vascularity was found in the rectum. Biopsies were taken with a cold       forceps for histology. The mucosa bled on contact. No bleeding seen       anywhere throught the colon . The stool was yellow in color.      Retroflexion not performed due to large solid stool in the rectum Impression:           - Preparation of the colon was poor.                       - Stool in the entire examined colon.                       - Decreased mucosa vascular pattern in the rectum.                       - Diffuse severe inflammation was found in the rectum                        secondary to colitis. Biopsied. Recommendation:       - Return patient to hospital ward for ongoing care.                       - Use Rowasa enemas 1 per rectum daily for 4 weeks. Procedure Code(s):    --- Professional ---                       [phone], Colonoscopy, flexible; with biopsy, single or                        multiple Diagnosis Code(s):    --- Professional ---                       K52.9, Noninfective gastroenteritis and  colitis,                        unspecified                       K62.5, Hemorrhage of anus and rectum CPT copyright 2016 American Medical Association. All rights reserved. The codes documented in this report are preliminary and upon coder review may  be revised to meet current compliance requirements. Wyline Mood, MD Wyline Mood MD, MD 04/29/2016  1:48:32 PM This report has been signed electronically. Number of Addenda: 0 Note Initiated On: 04/29/2016 1:13 PM Scope Withdrawal Time: 0 hours 13 minutes 13 seconds  Total Procedure Duration: 0 hours 20 minutes 4 seconds       [facility]

## 2016-04-29 NOTE — Progress Notes (Signed)
[employer] - [place] at [facility]   PATIENT NAME: Molly [name]    MR#:  [id number]  DATE OF BIRTH:  1945/10/04  SUBJECTIVE:  CHIEF COMPLAINT:   Chief Complaint  Patient presents with  . Rectal Bleeding   Still has some dark blood in stool. No abdominal pain.  REVIEW OF SYSTEMS:    Review of Systems  Constitutional: Positive for malaise/fatigue. Negative for chills and fever.  HENT: Negative for sore throat.   Eyes: Negative for blurred vision, double vision and pain.  Respiratory: Negative for cough, hemoptysis, shortness of breath and wheezing.   Cardiovascular: Negative for chest pain, palpitations, orthopnea and leg swelling.  Gastrointestinal: Positive for blood in stool. Negative for abdominal pain, constipation, diarrhea, heartburn, nausea and vomiting.  Genitourinary: Negative for dysuria and hematuria.  Musculoskeletal: Negative for back pain and joint pain.  Skin: Negative for rash.  Neurological: Positive for weakness. Negative for sensory change, speech change, focal weakness and headaches.  Endo/Heme/Allergies: Does not bruise/bleed easily.  Psychiatric/Behavioral: Negative for depression. The patient is not nervous/anxious.     DRUG ALLERGIES:  No Known Allergies  VITALS:  Blood pressure 129/64, pulse (!) 103, temperature 98.4 F (36.9 C), temperature source Oral, resp. rate 19, height 5\' 5"  (1.651 m), weight 90.5 kg (199 lb 9.6 oz), SpO2 95 %.  PHYSICAL EXAMINATION:   Physical Exam  GENERAL:  71 y.o.-year-old patient lying in the bed with no acute distress.  EYES: Pupils equal, round, reactive to light and accommodation. No scleral icterus. Extraocular muscles intact.  HEENT: Head atraumatic, normocephalic. Oropharynx and nasopharynx clear.  NECK:  Supple, no jugular venous distention. No thyroid enlargement, no tenderness.  LUNGS: Normal breath sounds bilaterally, no wheezing, rales, rhonchi. No use of accessory muscles of respiration.   CARDIOVASCULAR: S1, S2 normal. No murmurs, rubs, or gallops.  ABDOMEN: Soft, nontender, nondistended. Bowel sounds present. No organomegaly or mass.  EXTREMITIES: No cyanosis, clubbing or edema b/l.    NEUROLOGIC: left hemiparesis   PSYCHIATRIC: The patient is alert and awake SKIN: No obvious rash, lesion, or ulcer.   LABORATORY PANEL:   CBC  Recent Labs Lab 04/28/16 0126 04/29/16 0426  WBC 11.4*  --   HGB 10.2* 7.5*  HCT 30.7*  --   PLT 388  --    ------------------------------------------------------------------------------------------------------------------ Chemistries   Recent Labs Lab 04/27/16 0625 04/28/16 0126  NA 133* 135  K 3.7 4.4  CL 95* 100*  CO2 33* 30  GLUCOSE 197* 163*  BUN 11 11  CREATININE 0.58 0.56  CALCIUM 8.0* 7.8*  AST 23  --   ALT 21  --   ALKPHOS 73  --   BILITOT 0.7  --    ------------------------------------------------------------------------------------------------------------------  Cardiac Enzymes No results for input(s): TROPONINI in the last 168 hours. ------------------------------------------------------------------------------------------------------------------  RADIOLOGY:  No results found.   ASSESSMENT AND PLAN:   * Melena with bright red blood per rectum Initial concern for upper GI bleed but EGD showed only mild gastritis.. No bleeding. Could not get colonoscopy yesterday due patient not drinking GoLYTELY. Acute drop in hemoglobin today. Acute blood loss anemia due to GI bleed.  We'll transfuse 1 unit packed RBC stat Hold aspirin and Plavix due to acute bleed. Referred for colonoscopy results. There is any worsening patient will need a bleeding scan. Critically ill  * Diabetes mellitus. Reduce Lantus dose to half. On sliding scale insulin every 4 hours.  * History of CVA. Hold aspirin and Plavix due to GI bleed.  Continue statin.  * DVT prophylaxis with SCDs.  All the records are reviewed and case  discussed with Care Management/Social Workerr. Management plans discussed with the patient, family and they are in agreement.  CODE STATUS: FULL CODE  DVT Prophylaxis: SCDs  TOTAL TIME TAKING CARE OF THIS PATIENT: 35 minutes.   POSSIBLE D/C IN 1-2 DAYS, DEPENDING ON CLINICAL CONDITION.  Milagros LollSudini, Alyas Creary R M.D on 04/29/2016 at 12:58 PM  Between 7am to 6pm - Pager - [phone]  After 6pm go to [email or web address] - password [email or web address] [facility]  [facility] [place] [facility]  Office  [phone]  CC: Primary care physician; Elita Booneoberts, Caroline C, MD  Note: This dictation was prepared with Dragon dictation along with smaller phrase technology. Any transcriptional errors that result from this process are unintentional.

## 2016-04-29 NOTE — Anesthesia Preprocedure Evaluation (Signed)
Anesthesia Evaluation  Patient identified by MRN, date of birth, ID band Patient awake    Reviewed: Allergy & Precautions, NPO status , reviewed documented beta blocker date and time   Airway Mallampati: III       Dental  (+) Teeth Intact   Pulmonary COPD, former smoker,     + decreased breath sounds      Cardiovascular Exercise Tolerance: Poor hypertension, Pt. on medications  Rhythm:Regular     Neuro/Psych Anxiety CVA    GI/Hepatic negative GI ROS, Neg liver ROS,   Endo/Other  diabetes, Type 1, Insulin DependentMorbid obesity  Renal/GU      Musculoskeletal   Abdominal (+) + obese,   Peds  Hematology  (+) anemia ,   Anesthesia Other Findings   Reproductive/Obstetrics                             Anesthesia Physical Anesthesia Plan  ASA: IV  Anesthesia Plan: General   Post-op Pain Management:    Induction: Intravenous  Airway Management Planned: Natural Airway and Nasal Cannula  Additional Equipment:   Intra-op Plan:   Post-operative Plan:   Informed Consent: I have reviewed the patients History and Physical, chart, labs and discussed the procedure including the risks, benefits and alternatives for the proposed anesthesia with the patient or authorized representative who has indicated his/her understanding and acceptance.     Plan Discussed with: CRNA and Surgeon  Anesthesia Plan Comments:         Anesthesia Quick Evaluation

## 2016-04-29 NOTE — Anesthesia Post-op Follow-up Note (Cosign Needed)
Anesthesia QCDR form completed.        

## 2016-04-29 NOTE — Progress Notes (Signed)
Colonoscopy:   Very poor prep- large qty of solid stool seen . Managed to reach the cecum.  Stool was yellow throughout. No active bleeding. Mucosa grossly appeared normal till I reached the rectum from the 20 cm mark till the anus , loss of vascularity, nodularity, congestion, erythema seen , multiple biopsies taken , bled on contact.   Impression: 1. 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 No NSAID;s 2. Commence on Lialda, rowasa- orders placed 3. Advance diet  4. Monitor CBC  Dr Wyline MoodKiran Kandyce Dieguez  Gastroenterology/Hepatology Pager: [phone]

## 2016-04-29 NOTE — Progress Notes (Signed)
RN transferred with pt to Endo due to 1 unit of RBC's still transfusing. Pt was stable during transporting to endo.  Molly [name] [place]

## 2016-04-29 NOTE — Transfer of Care (Signed)
Immediate Anesthesia Transfer of Care Note  Patient: Molly HughsBrenda P Jost  Procedure(s) Performed: Procedure(s): COLONOSCOPY WITH PROPOFOL (N/A)  Patient Location: PACU  Anesthesia Type:General  Level of Consciousness: sedated and responds to stimulation  Airway & Oxygen Therapy: Patient Spontanous Breathing and Patient connected to nasal cannula oxygen  Post-op Assessment: Report given to RN and Post -op Vital signs reviewed and stable  Post vital signs: Reviewed and stable  Last Vitals:  Vitals:   04/29/16 1330 04/29/16 1353  BP: 106/67 124/66  Pulse: 94 98  Resp: 16 12  Temp: 36.6 C     Last Pain:  Vitals:   04/29/16 1330  TempSrc: Tympanic  PainSc:          Complications: No apparent anesthesia complications

## 2016-04-30 ENCOUNTER — Encounter: Payer: Self-pay | Admitting: Gastroenterology

## 2016-04-30 DIAGNOSIS — E119 Type 2 diabetes mellitus without complications: Secondary | ICD-10-CM | POA: Diagnosis not present

## 2016-04-30 DIAGNOSIS — Z7902 Long term (current) use of antithrombotics/antiplatelets: Secondary | ICD-10-CM | POA: Diagnosis not present

## 2016-04-30 DIAGNOSIS — E785 Hyperlipidemia, unspecified: Secondary | ICD-10-CM | POA: Diagnosis not present

## 2016-04-30 DIAGNOSIS — I10 Essential (primary) hypertension: Secondary | ICD-10-CM | POA: Diagnosis not present

## 2016-04-30 DIAGNOSIS — K529 Noninfective gastroenteritis and colitis, unspecified: Secondary | ICD-10-CM | POA: Diagnosis not present

## 2016-04-30 DIAGNOSIS — K5120 Ulcerative (chronic) proctitis without complications: Secondary | ICD-10-CM | POA: Diagnosis not present

## 2016-04-30 DIAGNOSIS — Z823 Family history of stroke: Secondary | ICD-10-CM | POA: Diagnosis not present

## 2016-04-30 DIAGNOSIS — L8990 Pressure ulcer of unspecified site, unspecified stage: Secondary | ICD-10-CM | POA: Diagnosis not present

## 2016-04-30 DIAGNOSIS — K625 Hemorrhage of anus and rectum: Secondary | ICD-10-CM | POA: Diagnosis not present

## 2016-04-30 DIAGNOSIS — J449 Chronic obstructive pulmonary disease, unspecified: Secondary | ICD-10-CM | POA: Diagnosis not present

## 2016-04-30 DIAGNOSIS — Z87891 Personal history of nicotine dependence: Secondary | ICD-10-CM | POA: Diagnosis not present

## 2016-04-30 DIAGNOSIS — I69354 Hemiplegia and hemiparesis following cerebral infarction affecting left non-dominant side: Secondary | ICD-10-CM | POA: Diagnosis not present

## 2016-04-30 DIAGNOSIS — R32 Unspecified urinary incontinence: Secondary | ICD-10-CM | POA: Diagnosis not present

## 2016-04-30 DIAGNOSIS — Z79899 Other long term (current) drug therapy: Secondary | ICD-10-CM | POA: Diagnosis not present

## 2016-04-30 DIAGNOSIS — F419 Anxiety disorder, unspecified: Secondary | ICD-10-CM | POA: Diagnosis not present

## 2016-04-30 DIAGNOSIS — Z7982 Long term (current) use of aspirin: Secondary | ICD-10-CM | POA: Diagnosis not present

## 2016-04-30 DIAGNOSIS — Z794 Long term (current) use of insulin: Secondary | ICD-10-CM | POA: Diagnosis not present

## 2016-04-30 DIAGNOSIS — D62 Acute posthemorrhagic anemia: Secondary | ICD-10-CM | POA: Diagnosis not present

## 2016-04-30 DIAGNOSIS — K922 Gastrointestinal hemorrhage, unspecified: Secondary | ICD-10-CM | POA: Diagnosis present

## 2016-04-30 LAB — SURGICAL PATHOLOGY

## 2016-04-30 LAB — GLUCOSE, CAPILLARY
GLUCOSE-CAPILLARY: 110 mg/dL — AB (ref 65–99)
Glucose-Capillary: 112 mg/dL — ABNORMAL HIGH (ref 65–99)
Glucose-Capillary: 71 mg/dL (ref 65–99)
Glucose-Capillary: 92 mg/dL (ref 65–99)
Glucose-Capillary: 97 mg/dL (ref 65–99)

## 2016-04-30 LAB — HEMOGLOBIN: Hemoglobin: 8.4 g/dL — ABNORMAL LOW (ref 12.0–16.0)

## 2016-04-30 MED ORDER — MESALAMINE 1.2 G PO TBEC: 4.8000 g | DELAYED_RELEASE_TABLET | Freq: Every day | ORAL | 0 refills | Status: DC

## 2016-04-30 MED ORDER — MESALAMINE 4 G RE ENEM
4.0000 g | ENEMA | Freq: Every day | RECTAL | 0 refills | Status: DC
Start: 2016-04-30 — End: 2019-09-21

## 2016-04-30 NOTE — Discharge Summary (Signed)
[facility] - [place] at [facility]   PATIENT NAME: Molly [name]    MR#:  [id number]  DATE OF BIRTH:  [birth year]  DATE OF ADMISSION:  04/27/2016 ADMITTING PHYSICIAN: Milagros Loll, MD  DATE OF DISCHARGE: 04/30/2016  PRIMARY CARE PHYSICIAN: Elita Boone, MD   ADMISSION DIAGNOSIS:  Acute GI bleeding [K92.2]  DISCHARGE DIAGNOSIS:  Active Problems:   Acute GI bleeding   Anemia, posthemorrhagic, acute   Blood in stool   Gastritis without bleeding   Pressure injury of skin   SECONDARY DIAGNOSIS:   Past Medical History:  Diagnosis Date  . Anxiety   . COPD (chronic obstructive pulmonary disease) (HCC)   . Diabetes (HCC)   . Hemorrhoid   . HLD (hyperlipidemia)   . HTN (hypertension)   . Stroke [facility]) 2008   left weakness     ADMITTING HISTORY  HISTORY OF PRESENT ILLNESS:  Molly [name]  is a 71 y.o. female with a known history of CVA, hypertension, chronic Foley catheter, hemorrhoids presents to the emergency room after nursing home staff found patient to have blood in diaper. Initially this was thought to be vaginal issues had in the past but after seeing blood in her rectum patient was brought to the emergency room. Here she has had one more episode of melanotic stool with bright red blood. Patient has drop in her hemoglobin from 13-11 and is being admitted to the hospitalist service. She is on aspirin and Plavix since her stroke. Patient is currently working with physical therapy. Has left-sided weakness from old stroke. Recent stroke in November 2017 has caused more memory problems and personality changes at the frontal stroke.  History obtained from ED staff, old records, patient and husband at bedside.  No abdominal pain or nausea or vomiting or fever.   HOSPITAL COURSE:   * Melena with bright red blood per rectum Initial concern for upper GI bleed but EGD showed only mild gastritis.. No bleeding. Could not get colonoscopy yesterday due  patient not drinking GoLYTELY. Acute drop in hemoglobin to 7.5 on day 2 of admission required 1 unit packed RBC transfusion.  Hold aspirin and Plavix due to GI bleed until patient follows with Dr. Servando Snare as outpatient in one to weeks. Colonoscopy showed ulcerative proctitis. Started on mesalamine by GI.  * Acute blood loss anemia. Hemoglobin stable at 8.3.  * Diabetes mellitus.  Continue Lantus  * History of CVA. Hold aspirin and Plavix due to GI bleed. Continue statin.  * Urinary retention Patient had urinary retention during previous admission. This time her Foley catheter has been removed and patient is widening well.  Patient stable for discharge back to nursing home.  CONSULTS OBTAINED:  Treatment Team:  Midge Minium, MD  DRUG ALLERGIES:  No Known Allergies  DISCHARGE MEDICATIONS:   Current Discharge Medication List    START taking these medications   Details  mesalamine (LIALDA) 1.2 g EC tablet Take 4 tablets (4.8 g total) by mouth daily with breakfast. Qty: 90 tablet, Refills: 0    mesalamine (ROWASA) 4 g enema Place 60 mLs (4 g total) rectally at bedtime. Qty: 1 Bottle, Refills: 0      CONTINUE these medications which have NOT CHANGED   Details  acetaminophen (TYLENOL) 325 MG tablet Take 650 mg by mouth every 6 (six) hours as needed.    atorvastatin (LIPITOR) 40 MG tablet Take 1 tablet (40 mg total) by mouth daily at 6 PM. Qty: 30 tablet, Refills: 2  B Complex-C (SUPER B COMPLEX PO) Take 1 tablet by mouth daily.     bisacodyl (DULCOLAX) 5 MG EC tablet Take 1 tablet (5 mg total) by mouth daily as needed for moderate constipation. Qty: 30 tablet, Refills: 0    calcium carbonate (OS-CAL) 600 MG TABS tablet Take 600 mg by mouth 2 (two) times daily.     Cholecalciferol (VITAMIN D3) 1000 UNITS CAPS Take 1,000 Units by mouth daily.    citalopram (CELEXA) 10 MG tablet Take 10 mg by mouth daily.    diazepam (VALIUM) 2 MG tablet Take 1 tablet (2 mg total) by  mouth daily. Qty: 30 tablet, Refills: 0    insulin detemir (LEVEMIR) 100 UNIT/ML injection Inject 0.1 mLs (10 Units total) into the skin at bedtime. Qty: 10 mL, Refills: 0    meclizine (ANTIVERT) 25 MG tablet Take 1 tablet (25 mg total) by mouth 3 (three) times daily. Qty: 30 tablet, Refills: 0    Melatonin 10 MG TBDP Take 10 mg by mouth at bedtime.    omeprazole (PRILOSEC) 20 MG capsule Take 20 mg by mouth daily.    triamcinolone cream (KENALOG) 0.1 % Apply 1 application topically 2 (two) times daily. Behind right ear    magnesium oxide (MAG-OX) 400 MG tablet Take 400 mg by mouth 2 (two) times daily.    polyethylene glycol (MIRALAX / GLYCOLAX) packet Take 17 g by mouth daily.    senna (SENOKOT) 8.6 MG TABS tablet Take 1 tablet by mouth 2 (two) times daily.      STOP taking these medications     aspirin EC 81 MG tablet      clopidogrel (PLAVIX) 75 MG tablet      losartan (COZAAR) 50 MG tablet         Today   VITAL SIGNS:  Blood pressure 123/65, pulse 97, temperature 97.5 F (36.4 C), temperature source Oral, resp. rate 18, height 5\' 5"  (1.651 m), weight 91.1 kg (200 lb 12.8 oz), SpO2 99 %.  I/O:   Intake/Output Summary (Last 24 hours) at 04/30/16 1308 Last data filed at 04/30/16 1100  Gross per 24 hour  Intake           2878.5 ml  Output                2 ml  Net           2876.5 ml    PHYSICAL EXAMINATION:  Physical Exam  GENERAL:  71 y.o.-year-old patient lying in the bed with no acute distress.  LUNGS: Normal breath sounds bilaterally, no wheezing, rales,rhonchi or crepitation. No use of accessory muscles of respiration.  CARDIOVASCULAR: S1, S2 normal. No murmurs, rubs, or gallops.  ABDOMEN: Soft, non-tender, non-distended. Bowel sounds present. No organomegaly or mass.  NEUROLOGIC: Left weakness PSYCHIATRIC: The patient is alert and awake SKIN: No obvious rash, lesion, or ulcer.   DATA REVIEW:   CBC  Recent Labs Lab 04/28/16 0126  04/29/16 1500  04/30/16 0425  WBC 11.4*  --   --   --   HGB 10.2*  < > 8.2* 8.4*  HCT 30.7*  --  24.3*  --   PLT 388  --   --   --   < > = values in this interval not displayed.  Chemistries   Recent Labs Lab 04/27/16 0625 04/28/16 0126  NA 133* 135  K 3.7 4.4  CL 95* 100*  CO2 33* 30  GLUCOSE 197* 163*  BUN 11 11  CREATININE 0.58 0.56  CALCIUM 8.0* 7.8*  AST 23  --   ALT 21  --   ALKPHOS 73  --   BILITOT 0.7  --     Cardiac Enzymes No results for input(s): TROPONINI in the last 168 hours.  Microbiology Results  Results for orders placed or performed during the hospital encounter of 04/27/16  MRSA PCR Screening     Status: None   Collection Time: 04/27/16  2:19 PM  Result Value Ref Range Status   MRSA by PCR NEGATIVE NEGATIVE Final    Comment:        The GeneXpert MRSA Assay (FDA approved for NASAL specimens only), is one component of a comprehensive MRSA colonization surveillance program. It is not intended to diagnose MRSA infection nor to guide or monitor treatment for MRSA infections.     RADIOLOGY:  No results found.  Follow up with PCP in 1 week.  Management plans discussed with the patient, family and they are in agreement.  CODE STATUS:     Code Status Orders        Start     Ordered   04/27/16 0857  Full code  Continuous     04/27/16 0857    Code Status History    Date Active Date Inactive Code Status Order ID Comments User Context   03/05/2016  3:49 PM 03/12/2016  5:23 PM Full Code [id number]  Eugenie Norrie, NP ED   02/06/2016  3:32 PM 02/11/2016  7:25 PM Full Code [id number]  Wyatt Haste, MD ED   02/01/2016  9:17 PM 02/05/2016  1:59 PM Full Code [id number]  Auburn Bilberry, MD Inpatient   01/14/2016  2:01 PM 01/16/2016 10:22 PM Full Code [id number]  Katha Hamming, MD ED   11/19/2014 10:40 PM 11/21/2014  8:55 PM Full Code [id number]  Oralia Manis, MD Inpatient   05/22/2014  6:31 PM 05/25/2014  8:20 PM Full Code [id number]  Toni Arthurs, MD Inpatient    05/21/2014 10:31 PM 05/22/2014  6:31 PM Full Code [id number]  Yevonne Pax, MD Inpatient      TOTAL TIME TAKING CARE OF THIS PATIENT ON DAY OF DISCHARGE: more than 30 minutes.   Milagros Loll R M.D on 04/30/2016 at 1:08 PM  Between 7am to 6pm - Pager - [phone]  After 6pm go to [email or web address] - password [email or web address] [facility]  [facility] [facility]  Office  [phone]  CC: Primary care physician; Elita Boone, MD  Note: This dictation was prepared with Dragon dictation along with smaller phrase technology. Any transcriptional errors that result from this process are unintentional.

## 2016-04-30 NOTE — Progress Notes (Signed)
Pt left unit via EMS for [facility]. No acute distress at d/c.

## 2016-04-30 NOTE — Discharge Instructions (Signed)
Do not take aspirin and plavix till you go see Dr. Servando SnareWohl

## 2016-04-30 NOTE — Plan of Care (Signed)
Problem: Education: Goal: Knowledge of [facility] Education information/materials will improve Outcome: Not Progressing Pt confused   

## 2016-04-30 NOTE — Progress Notes (Signed)
[facility]. Report called to Frederich Chaebbie Smith, LPN at [facility]. EMS notified for transfer.

## 2016-04-30 NOTE — Clinical Social Work Note (Signed)
MD to discharge patient today to [facility]. CSW has contacted Stanton KidneyDebra at [facility] and made her aware and sent discharge information. Patient's husband is aware.  York SpanielMonica Juel Bellerose MSW,LCSW [phone]

## 2016-04-30 NOTE — Plan of Care (Signed)
Problem: Fluid Volume: Goal: Ability to maintain a balanced intake and output will improve Outcome: Progressing No bloody stools overnight; Incontinent;

## 2016-05-01 LAB — TYPE AND SCREEN
ABO/RH(D): A POS
Antibody Screen: NEGATIVE
UNIT DIVISION: 0
UNIT DIVISION: 0
UNIT DIVISION: 0
Unit division: 0
Unit division: 0
Unit division: 0

## 2016-05-06 ENCOUNTER — Telehealth: Payer: Self-pay

## 2016-05-06 NOTE — Telephone Encounter (Signed)
Pt has outpatient appt scheduled on 05/12/16.

## 2016-05-06 NOTE — Telephone Encounter (Signed)
-----   Message from Wyline MoodKiran Anna, MD sent at 05/04/2016 12:23 PM EST ----- Rectal biopsies showed inflammation- will need out patient gi follow up

## 2016-05-12 ENCOUNTER — Ambulatory Visit: Payer: Self-pay | Admitting: Gastroenterology

## 2016-06-11 ENCOUNTER — Other Ambulatory Visit: Payer: Self-pay

## 2016-06-11 ENCOUNTER — Ambulatory Visit (INDEPENDENT_AMBULATORY_CARE_PROVIDER_SITE_OTHER): Payer: Medicare HMO | Admitting: Gastroenterology

## 2016-06-11 ENCOUNTER — Encounter: Payer: Self-pay | Admitting: Gastroenterology

## 2016-06-11 VITALS — BP 103/64 | HR 101 | Temp 97.8°F | Ht 66.0 in | Wt 198.4 lb

## 2016-06-11 DIAGNOSIS — K529 Noninfective gastroenteritis and colitis, unspecified: Secondary | ICD-10-CM | POA: Diagnosis not present

## 2016-06-11 NOTE — Progress Notes (Signed)
Primary Care Physician: Elita Boone, MD  Primary Gastroenterologist:  Dr. Midge Minium  Chief Complaint  Patient presents with  . Hospitalization Follow-up  . GI Bleeding    HPI: Molly [name] is a 71 y.o. female here For follow-up after being in the hospital. The patient was found in the hospital to have heme positive stools and diarrhea. The patient underwent a colonoscopy and upper endoscopy. The patient was found to have colitis on the colonoscopy. The patient states that she is doing better now with her stools being more solid. The biopsies were consistent with chronic active colitis associated with possible inflammatory bowel disease versus adjacent diverticulitis.  Current Outpatient Prescriptions  Medication Sig Dispense Refill  . acetaminophen (TYLENOL) 325 MG tablet Take 650 mg by mouth every 6 (six) hours as needed.    Marland Kitchen albuterol (PROVENTIL HFA;VENTOLIN HFA) 108 (90 Base) MCG/ACT inhaler ALBUTEROL 90 MCG/ACT AERS    . amitriptyline (ELAVIL) 50 MG tablet Take 50 mg by mouth.    . Ascorbic Acid (VITAMIN C) 1000 MG tablet Take 1,000 mg by mouth.    Marland Kitchen aspirin EC 81 MG tablet ASPIRIN 81 MG TABS    . atorvastatin (LIPITOR) 40 MG tablet Take 1 tablet (40 mg total) by mouth daily at 6 PM. 30 tablet 2  . B Complex Vitamins (VITAMIN-B COMPLEX) TABS Take by mouth.    . B Complex-C (SUPER B COMPLEX PO) Take 1 tablet by mouth daily.     . bisacodyl (DULCOLAX) 5 MG EC tablet Take 1 tablet (5 mg total) by mouth daily as needed for moderate constipation. 30 tablet 0  . buPROPion (WELLBUTRIN XL) 300 MG 24 hr tablet Take by mouth.    . Calcium Carb-Cholecalciferol (CALCIUM-VITAMIN D) 500-200 MG-UNIT tablet Take by mouth.    . calcium carbonate (OS-CAL) 600 MG TABS tablet Take 600 mg by mouth 2 (two) times daily.     . Cholecalciferol (VITAMIN D3) 1000 UNITS CAPS Take 1,000 Units by mouth daily.    . citalopram (CELEXA) 10 MG tablet Take 10 mg by mouth daily.    . clopidogrel  (PLAVIX) 75 MG tablet Take 75 mg by mouth.    . Coenzyme Q10 100 MG capsule Take by mouth.    . diazepam (VALIUM) 2 MG tablet Take 1 tablet (2 mg total) by mouth daily. 30 tablet 0  . docusate sodium (COLACE) 100 MG capsule Take by mouth.    . donepezil (ARICEPT) 5 MG tablet Take by mouth.    . Glucosamine-Chondroitin 500-400 MG CAPS Take by mouth.    . insulin aspart protamine - aspart (NOVOLOG 70/30 MIX) (70-30) 100 UNIT/ML FlexPen Inject into the skin.    Marland Kitchen insulin detemir (LEVEMIR) 100 UNIT/ML injection Inject 0.1 mLs (10 Units total) into the skin at bedtime. 10 mL 0  . insulin lispro protamine-lispro (HUMALOG 75/25 MIX) (75-25) 100 UNIT/ML SUSP injection Inject into the skin.    . Insulin Pen Needle (FIFTY50 PEN NEEDLES) 31G X 8 MM MISC BD PEN NEEDLE SHORT U/F 31G X 8 MM MISC    . Lancet Devices ([employer] LANCING DEVICE) MISC ACCU-CHEK MULTICLIX LANCETS MISC    . losartan (COZAAR) 50 MG tablet Take 50 mg by mouth.    . magnesium oxide (MAG-OX) 400 MG tablet Take 400 mg by mouth 2 (two) times daily.    . meclizine (ANTIVERT) 25 MG tablet Take 1 tablet (25 mg total) by mouth 3 (three) times daily. (Patient taking differently: Take 25  mg by mouth 3 (three) times daily as needed (vertigo). ) 30 tablet 0  . Melatonin 10 MG TBDP Take 10 mg by mouth at bedtime.    . mesalamine (LIALDA) 1.2 g EC tablet Take 4 tablets (4.8 g total) by mouth daily with breakfast. 90 tablet 0  . mesalamine (ROWASA) 4 g enema Place 60 mLs (4 g total) rectally at bedtime. 1 Bottle 0  . metFORMIN (GLUCOPHAGE) 500 MG tablet Take 1,000 mg by mouth.    . naproxen (NAPROSYN) 250 MG tablet Take 250 mg by mouth.    . niacin 500 MG tablet Take 500 mg by mouth.    . Omega-3 Fatty Acids (FISH OIL PO) Take by mouth.    Marland Kitchen. omeprazole (PRILOSEC) 20 MG capsule Take 20 mg by mouth daily.    . polyethylene glycol (MIRALAX / GLYCOLAX) packet Take 17 g by mouth daily.    Marland Kitchen. senna (SENOKOT) 8.6 MG TABS tablet Take 1 tablet by mouth 2 (two)  times daily.    Marland Kitchen. triamcinolone cream (KENALOG) 0.1 % Apply 1 application topically 2 (two) times daily. Behind right ear    . vitamin E 400 UNIT capsule Take by mouth.     No current facility-administered medications for this visit.     Allergies as of 06/11/2016  . (No Known Allergies)    ROS:  General: Negative for anorexia, weight loss, fever, chills, fatigue, weakness. ENT: Negative for hoarseness, difficulty swallowing , nasal congestion. CV: Negative for chest pain, angina, palpitations, dyspnea on exertion, peripheral edema.  Respiratory: Negative for dyspnea at rest, dyspnea on exertion, cough, sputum, wheezing.  GI: See history of present illness. GU:  Negative for dysuria, hematuria, urinary incontinence, urinary frequency, nocturnal urination.  Endo: Negative for unusual weight change.    Physical Examination:   BP 103/64   Pulse (!) 101   Temp 97.8 F (36.6 C) (Oral)   Ht 5\' 6"  (1.676 m)   Wt 198 lb 6.4 oz (90 kg)   BMI 32.02 kg/m   General: Obese, well-developed in no acute distress.  Eyes: No icterus. Conjunctivae pink. Mouth: Oropharyngeal mucosa moist and pink , no lesions erythema or exudate. Lungs: Clear to auscultation bilaterally. Non-labored. Heart: Regular rate and rhythm, no murmurs rubs or gallops.  Abdomen: Bowel sounds are normal, nontender, nondistended, no hepatosplenomegaly or masses, no abdominal bruits or hernia , no rebound or guarding.   Neuro: Alert and oriented x 3.  Grossly intact. Skin: Warm and dry, no jaundice.   Psych: Alert and cooperative, normal mood and affect.  Labs:    Imaging Studies: No results found.  Assessment and Plan:   Molly [name] is a 71 y.o. y/o female who comes for follow-up after being in the hospital. The patient had symptoms of diarrhea with rectal bleeding and a colonoscopy showing chronic active colitis. The patient may have ulcerative colitis but her symptoms are getting better at the present time.  She is denying any bloody stools and states her stools are much more firm. The patient has been told that if she continues to improve to follow up as needed but if her diarrhea comes back when she has any rectal bleeding she may need to be started on treatment for ulcerative colitis. The patient has been explained the plan and agrees with it.    Midge Miniumarren Ozella Comins, MD. Clementeen GrahamFACG   Note: This dictation was prepared with Dragon dictation along with smaller phrase technology. Any transcriptional errors that result from this process  are unintentional.

## 2016-06-15 ENCOUNTER — Other Ambulatory Visit: Payer: Self-pay

## 2016-06-29 NOTE — Progress Notes (Signed)
   02/10/16 0939  PT Time Calculation  PT Start Time (ACUTE ONLY) 0843  PT Stop Time (ACUTE ONLY) 0907  PT Time Calculation (min) (ACUTE ONLY) 24 min  PT G-Codes **NOT FOR INPATIENT CLASS**  Functional Assessment Tool Used (Outpatient Only) clinical judgement  Functional Limitation Mobility: Walking and moving around  Mobility: Walking and Moving Around Current Status (V4259) CM  Mobility: Walking and Moving Around Goal Status (D6387) CK  PT General Charges  $$ ACUTE PT VISIT 1 Procedure  PT Evaluation  $PT Eval Moderate Complexity 1 Procedure  PT Treatments  $Therapeutic Exercise 8-22 mins    Late entry of G-Codes added after Review of documentation  Myrene Galas, PT DPT 06/29/16, 8:24 AM [phone]

## 2016-07-06 NOTE — Progress Notes (Signed)
   02/11/16 1445  Acute Rehab OT Goals  Patient Stated Goal To retrun to [facility] for rehab.  OT Goal Formulation With patient  Potential to Achieve Goals Good  OT Time Calculation  OT Start Time (ACUTE ONLY) 1400  OT Stop Time (ACUTE ONLY) 1430  OT Time Calculation (min) 30 min  OT G-codes **NOT FOR INPATIENT CLASS**  Functional Assessment Tool Used Clinical judgement  Functional Limitation Self care  Self Care Current Status (A2130) CM  Self Care Goal Status (Q6578) CL  OT General Charges  $OT Visit 1 Procedure  OT Treatments  $Self Care/Home Management  23-37 mins  Late Entry G-Code entered by Olegario Messier, MS, OTR/L. Assessment was completed by Olegario Messier, MS, OTR/L

## 2016-07-16 NOTE — Progress Notes (Signed)
Late entry for missed G-code. Based on review of the evaluation and goals by this therapist, Jerilynn Som, MS, CCC-SLP.   02/07/16 1500  SLP Visit Information  SLP Received On 02/07/16  Subjective  Subjective pt awake, recently returned from test. Husband present.   Patient/Family Stated Goal none given  General Information  Date of Onset 02/06/16  HPI Pt is a 71 y.o. female with multiple medical issues including anxiety, COPD, and other medical issues w/ a known history of recent left frontal stroke discharged yesterday from the hospital who is presenting with slurred speech - husband described as "not talking right in the head". Patient was at [facility] for rehabilitation at discharge yesterday and was even seen by Speech Therapy when noted to have slurred speech around 10 AM which is new according to family members. Patient was unable to provide meaningful information at admission yesterday but is now verbal and able to answer basic questions re: self. Husband stated that her speech "is better now than it has been in 3 weeks". She was awake/alert; answered basic y/n questions; followed few 1-step commands. She new she had returned to the hospital.    Type of Study Bedside Swallow Evaluation  Previous Swallow Assessment none  Diet Prior to this Study Dysphagia 3 (soft);Thin liquids  Temperature Spikes Noted No (wbc 10.2)  Respiratory Status Room air  History of Recent Intubation No  Behavior/Cognition Alert;Cooperative;Pleasant mood;Distractible;Requires cueing  Oral Cavity Assessment WFL  Oral Care Completed by SLP Recent completion by staff  Oral Cavity - Dentition Missing dentition  Vision Functional for self-feeding  Self-Feeding Abilities Able to feed self;Needs assist;Needs set up  Patient Positioning Upright in bed  Baseline Vocal Quality Normal  Volitional Cough Strong  Volitional Swallow Able to elicit  Oral Assessment (Complete on admission/transfer/change in patient  condition)  Does patient have any of the following "high risk" factors? None of the above  Does patient have any of the following "at risk" factors? None of the above  Patient is LOW RISK Follow universal precautions (see row information)  Oral Motor/Sensory Function  Overall Oral Motor/Sensory Function WFL  Ice Chips  Ice chips NT  Thin Liquid  Thin Liquid WFL  Presentation Self Fed;Straw  Other Comments pt also swallowed pills w/ water(thin liquid)  Nectar Thick Liquid  Nectar Thick Liquid NT  Honey Thick Liquid  Honey Thick Liquid NT  Puree  Puree WFL  Presentation Spoon (fed; 3 trials)  Other Comments pt waiting for her lunch meal  Solid  Solid NT  Other Comments waiting for her lunch meal  SLP - End of Session  Patient left in bed;with call bell/phone within reach;with bed alarm set;with family/visitor present  Nurse Communication Aspiration precautions reviewed;Diet recommendation;Treatment plan  Suspected Esophageal Findings  Suspected Esophageal Findings (none noted)  SLP Assessment  Clinical Impression Statement (ACUTE ONLY) Pt appeared to adequately tolerate few trials of thin liquids and puree w/ no immediate, overt s/s of aspiration noted; trials were few but pt and husband stated they were waiting for the lunch tray. No coughing or decline in respiratory status was noted; oral phase was wfl for bolus management of trials taken including manipulating pills in water for swallowing. Pt assisted in feeding self. She verbally interacted w/ SLP and husband/NSG w/ general responses(few); appeared more alert to task than at admission. Husband stated pt's speech was clearer than it had been in a few weeks but that she "still talks out of her head at times".  Speech Therapy had made contact for assessment of pt at her admission to SNF yesterday and will be following her there. Recommended pt would greatly benefit from continued f/u w/ Speech Therapy at SNF when she returns after this  admission to improve functional communication for ADLs in her home environment. Husband agreed. No Cognitive screening recommended at this setting as this will be reintiated at her SNF setting at discharge. Recommend continue w/ a Dysphagia 3 diet w/ thin liqudis w/ general aspiration precautions. Husband and pt agreed. NSG updated. Noted Neurology consult and findings during assessment.   Impact on safety and function (reduced)  Other Related Risk Factors Cognitive impairment;Deconditioning;Previous CVA  Swallow Evaluation Recommendations  Recommended Consults (TBD)  SLP Diet Recommendations Thin;Dysphagia 3 (mechanical soft)  Liquid Administration via Cup;Straw  Medication Administration Whole meds with puree (as needed for easier swallowing)  Supervision Patient able to self feed;Staff to assist with self feeding;Intermittent supervision to cue for compensatory strategies  Compensations Minimize environmental distractions;Slow rate;Small sips/bites;Follow solids with liquid  Postural Changes Seated upright at 90 degrees;Remain upright for at least 30 minutes after feeds/meals  Treatment Plan  Oral Care Recommendations Oral care BID;Staff/trained caregiver to provide oral care  Treatment Recommendations No treatment recommended at this time  Follow up Recommendations Skilled Nursing facility (for ongoing Cognitive-Linguistic assessment/tx)  Interventions Aspiration precaution training;Patient/family education  Prognosis  Prognosis for Safe Diet Advancement Good  Barriers to Reach Goals Cognitive deficits  Individuals Consulted  Consulted and Agree with Results and Recommendations Patient;Family member/caregiver;MD;RN  Family Member Consulted husband  SLP Time Calculation  SLP Start Time (ACUTE ONLY) 1230  SLP Stop Time (ACUTE ONLY) 1315  SLP Time Calculation (min) (ACUTE ONLY) 45 min  SLP G-Codes **NOT FOR INPATIENT CLASS**  Functional Assessment Tool Used clinical judgement   Functional Limitations Swallowing  Swallow Current Status (Z6109) CI  Swallow Goal Status (U0454) CI  Swallow Discharge Status (U9811) CI  SLP Evaluations  $ SLP Speech Visit 1 Procedure  SLP Evaluations  $BSS Swallow 1 Procedure   Late entry for missed G-code. Based on review of the evaluation and goals by this therapist.   Jerilynn Som, MS, CCC-SLP

## 2017-01-26 ENCOUNTER — Emergency Department
Admission: EM | Admit: 2017-01-26 | Discharge: 2017-01-26 | Disposition: A | Discharge status: Home / Self Care | Payer: Medicare HMO | Attending: Emergency Medicine | Admitting: Emergency Medicine

## 2017-01-26 ENCOUNTER — Emergency Department: Payer: Medicare HMO

## 2017-01-26 ENCOUNTER — Encounter: Payer: Self-pay | Admitting: Emergency Medicine

## 2017-01-26 DIAGNOSIS — S39012A Strain of muscle, fascia and tendon of lower back, initial encounter: Secondary | ICD-10-CM | POA: Diagnosis not present

## 2017-01-26 DIAGNOSIS — Z794 Long term (current) use of insulin: Secondary | ICD-10-CM | POA: Insufficient documentation

## 2017-01-26 DIAGNOSIS — I10 Essential (primary) hypertension: Secondary | ICD-10-CM | POA: Insufficient documentation

## 2017-01-26 DIAGNOSIS — Z79899 Other long term (current) drug therapy: Secondary | ICD-10-CM | POA: Diagnosis not present

## 2017-01-26 DIAGNOSIS — W050XXA Fall from non-moving wheelchair, initial encounter: Secondary | ICD-10-CM | POA: Diagnosis not present

## 2017-01-26 DIAGNOSIS — S0003XA Contusion of scalp, initial encounter: Secondary | ICD-10-CM | POA: Diagnosis not present

## 2017-01-26 DIAGNOSIS — J449 Chronic obstructive pulmonary disease, unspecified: Secondary | ICD-10-CM | POA: Insufficient documentation

## 2017-01-26 DIAGNOSIS — Y9389 Activity, other specified: Secondary | ICD-10-CM | POA: Diagnosis not present

## 2017-01-26 DIAGNOSIS — Z7902 Long term (current) use of antithrombotics/antiplatelets: Secondary | ICD-10-CM | POA: Insufficient documentation

## 2017-01-26 DIAGNOSIS — Z7982 Long term (current) use of aspirin: Secondary | ICD-10-CM | POA: Diagnosis not present

## 2017-01-26 DIAGNOSIS — Y999 Unspecified external cause status: Secondary | ICD-10-CM | POA: Diagnosis not present

## 2017-01-26 DIAGNOSIS — E119 Type 2 diabetes mellitus without complications: Secondary | ICD-10-CM | POA: Insufficient documentation

## 2017-01-26 DIAGNOSIS — Y92129 Unspecified place in nursing home as the place of occurrence of the external cause: Secondary | ICD-10-CM | POA: Diagnosis not present

## 2017-01-26 DIAGNOSIS — W19XXXA Unspecified fall, initial encounter: Secondary | ICD-10-CM

## 2017-01-26 DIAGNOSIS — S0990XA Unspecified injury of head, initial encounter: Secondary | ICD-10-CM | POA: Diagnosis present

## 2017-01-26 DIAGNOSIS — Z87891 Personal history of nicotine dependence: Secondary | ICD-10-CM | POA: Insufficient documentation

## 2017-01-26 MED ORDER — OXYCODONE HCL 5 MG PO TABS
5.0000 mg | ORAL_TABLET | Freq: Once | ORAL | Status: AC
  Administered 2017-01-26: 5 mg via ORAL
  Filled 2017-01-26: qty 1

## 2017-01-26 MED ORDER — ACETAMINOPHEN 500 MG PO TABS
1000.0000 mg | ORAL_TABLET | Freq: Once | ORAL | Status: AC
  Administered 2017-01-26: 1000 mg via ORAL
  Filled 2017-01-26: qty 2

## 2017-01-26 NOTE — Discharge Instructions (Signed)
You were seen in the emergency department after a fall. Luckily all of your imaging studies did not show any evidence of injuries. Follow-up with you doctor within the next 2-3 days for further evaluation. Sometimes injuries can present at a later time and therefore it is imperative that you return to the emergency room if you have a severe headache, facial droop, neck pain, numbness or weakness of your extremities, slurred speech, difficulty finding words, chest pain, back pain, abdominal pain, or any other new symptoms that were not present during this visit. You may take Tylenol 1000mg every 8 hours at home for your pain. ° ° °

## 2017-01-26 NOTE — ED Provider Notes (Signed)
[facility] Emergency Department Provider Note  ____________________________________________  Time seen: Approximately 11:50 AM  I have reviewed the triage vital signs and the nursing notes.   HISTORY  Chief Complaint Head Injury   HPI Molly [name] is a 71 y.o. female with a history of COPD, diabetes, hypertension, hyperlipidemia, and stroke who presents for evaluation after mechanical fall. Patient reports that she was trasitionaing from the wheelchair to the bed yesterday when she fell on the ground. Fall was witnessed by staff. She fell onto her buttock. She then fell backwards hitting her head on the ground. No LOC. Patient is on Plavix. Patient is complaining of pain in her lower back and in the back of her head that has been constant, moderate, and worse with palpation since yesterday. According to her she hasn't received any pain medication. She denies neck pain, upper back pain, chest pain, abdominal pain, or extremity pain. Patient denies any dizziness or preceding symptoms leading to her fall.  Past Medical History:  Diagnosis Date  . Anxiety   . COPD (chronic obstructive pulmonary disease) (HCC)   . Diabetes (HCC)   . Hemorrhoid   . HLD (hyperlipidemia)   . HTN (hypertension)   . Stroke [facility]) 2008   left weakness    Patient Active Problem List   Diagnosis Date Noted  . Pressure injury of skin 04/28/2016  . Acute GI bleeding 04/27/2016  . Anemia, posthemorrhagic, acute   . Blood in stool   . Gastritis without bleeding   . Involuntary movements   . Acute respiratory failure (HCC) 03/05/2016  . Acute encephalopathy 02/06/2016  . Acute cerebrovascular accident (CVA) (HCC)   . Altered mental status   . 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 Cellulitis of left lower extremity 11/19/2014  . COPD (chronic obstructive pulmonary disease) (HCC) 11/19/2014  . HTN (hypertension) 11/19/2014  . HLD (hyperlipidemia) 11/19/2014  . Diabetes mellitus (HCC)  05/21/2014  . Chronic anemia 05/21/2014    Past Surgical History:  Procedure Laterality Date  . AMPUTATION Left 05/22/2014   Procedure: AMPUTATION RAY LEFT FOURTH AND FIFTH ;  Surgeon: Toni Arthurs, MD;  Location: [facility] OR;  Service: Orthopedics;  Laterality: Left;  . CATARACT EXTRACTION    . COLONOSCOPY  2004  . COLONOSCOPY WITH PROPOFOL N/A 04/29/2016   Procedure: COLONOSCOPY WITH PROPOFOL;  Surgeon: Wyline Mood, MD;  Location: [facility] ENDOSCOPY;  Service: Gastroenterology;  Laterality: N/A;  . ESOPHAGOGASTRODUODENOSCOPY (EGD) WITH PROPOFOL N/A 04/27/2016   Procedure: ESOPHAGOGASTRODUODENOSCOPY (EGD) WITH PROPOFOL;  Surgeon: Midge Minium, MD;  Location: [facility] ENDOSCOPY;  Service: Endoscopy;  Laterality: N/A;    Prior to Admission medications   Medication Sig Start Date End Date Taking? Authorizing Provider  amitriptyline (ELAVIL) 75 MG tablet Take 75 mg by mouth.    Yes [provider]  aspirin EC 81 MG tablet ASPIRIN 81 MG TABS 02/28/09  Yes [provider]  atorvastatin (LIPITOR) 40 MG tablet Take 1 tablet (40 mg total) by mouth daily at 6 PM. 02/03/16  Yes Enid Baas, MD  B Complex-C (SUPER B COMPLEX PO) Take 1 tablet by mouth daily.    Yes [provider]  clopidogrel (PLAVIX) 75 MG tablet Take 75 mg by mouth.   Yes [provider]  diclofenac sodium (VOLTAREN) 1 % GEL Apply 4 g topically 4 (four) times daily.   Yes [provider]  ferrous gluconate (FERGON) 324 MG tablet Take 324 mg by mouth daily with breakfast.   Yes [provider]  insulin detemir (LEVEMIR) 100 UNIT/ML injection Inject 0.1 mLs (10 Units total) into the skin at bedtime. Patient taking differently: Inject 10 Units into the skin daily. 10 units every morning, 45 units at bedtime 03/12/16  Yes Mody, Sital, MD  losartan (COZAAR) 25 MG tablet Take 25 mg by mouth.    Yes [provider]  magnesium oxide (MAG-OX) 400 MG tablet Take 400 mg by mouth 2 (two) times  daily.   Yes [provider]  Melatonin 10 MG TBDP Take 10 mg by mouth at bedtime.   Yes [provider]  metFORMIN (GLUCOPHAGE) 500 MG tablet Take 1,000 mg by mouth.   Yes [provider]  naproxen (NAPROSYN) 250 MG tablet Take 250 mg by mouth.   Yes [provider]  polyethylene glycol (MIRALAX / GLYCOLAX) packet Take 17 g by mouth daily.   Yes [provider]  acetaminophen (TYLENOL) 325 MG tablet Take 650 mg by mouth every 6 (six) hours as needed.    [provider]  albuterol (PROVENTIL HFA;VENTOLIN HFA) 108 (90 Base) MCG/ACT inhaler ALBUTEROL 90 MCG/ACT AERS 05/20/09   [provider]  bisacodyl (DULCOLAX) 5 MG EC tablet Take 1 tablet (5 mg total) by mouth daily as needed for moderate constipation. Patient not taking: Reported on 01/26/2017 01/16/16   Ramonita LabGouru, Aruna, MD  calcium carbonate (OS-CAL) 600 MG TABS tablet Take 600 mg by mouth 2 (two) times daily.     [provider]  Cholecalciferol (VITAMIN D3) 1000 UNITS CAPS Take 1,000 Units by mouth daily.    [provider]  diazepam (VALIUM) 2 MG tablet Take 1 tablet (2 mg total) by mouth daily. Patient not taking: Reported on 01/26/2017 03/12/16   Adrian SaranMody, Sital, MD  donepezil (ARICEPT) 5 MG tablet Take by mouth. 03/25/16 06/23/16  [provider]  Insulin Pen Needle (FIFTY50 PEN NEEDLES) 31G X 8 MM MISC BD PEN NEEDLE SHORT U/F 31G X 8 MM MISC 06/14/12   [provider]  Lancet Devices ([employer] LANCING DEVICE) MISC ACCU-CHEK MULTICLIX LANCETS MISC 11/30/12   [provider]  meclizine (ANTIVERT) 25 MG tablet Take 1 tablet (25 mg total) by mouth 3 (three) times daily. Patient not taking: Reported on 01/26/2017 01/16/16   Ramonita LabGouru, Aruna, MD  mesalamine (LIALDA) 1.2 g EC tablet Take 4 tablets (4.8 g total) by mouth daily with breakfast. Patient not taking: Reported on 01/26/2017 05/01/16   Milagros LollSudini, Srikar, MD  mesalamine (ROWASA) 4 g enema Place 60 mLs (4  g total) rectally at bedtime. Patient not taking: Reported on 01/26/2017 04/30/16   Milagros LollSudini, Srikar, MD  triamcinolone cream (KENALOG) 0.1 % Apply 1 application topically 2 (two) times daily. Behind right ear    [provider]    Allergies Patient has no known allergies.  Family History  Problem Relation Age of Onset  . Hyperlipidemia Mother   . Varicose Veins Mother   . Deep vein thrombosis Father   . Stroke Father   . Breast cancer Paternal Aunt   . Stroke Maternal Grandmother   . Stroke Paternal Grandmother     Social History Social History  Substance Use Topics  . Smoking status: Former Smoker    Packs/day: 0.50    Years: 2.00    Types: Cigarettes    Quit date: 11/18/1968  . Smokeless tobacco: Never Used  . Alcohol use No     Comment: seldom    Review of Systems Constitutional: Negative for fever. Eyes: Negative for  visual changes. ENT: Negative for facial injury or neck injury Cardiovascular: Negative for chest injury. Respiratory: Negative for shortness of breath. Negative for chest wall injury. Gastrointestinal: Negative for abdominal pain or injury. Genitourinary: Negative for dysuria. Musculoskeletal: + lower back pain. negative for arm or leg pain. Skin: Negative for laceration/abrasions. Neurological: + head injury.  ____________________________________________   PHYSICAL EXAM:  VITAL SIGNS: ED Triage Vitals  Enc Vitals Group     BP 01/26/17 1134 (!) 155/80     Pulse Rate 01/26/17 1134 84     Resp 01/26/17 1134 16     Temp 01/26/17 1134 98 F (36.7 C)     Temp Source 01/26/17 1134 Oral     SpO2 01/26/17 1134 97 %     Weight 01/26/17 1135 199 lb (90.3 kg)     Height 01/26/17 1135 5\' 6"  (1.676 m)     Head Circumference --      Peak Flow --      Pain Score 01/26/17 1134 7     Pain Loc --      Pain Edu? --      Excl. in GC? --    Full spinal precautions maintained throughout the trauma exam. Constitutional: Alert and oriented. No acute  distress. Does not appear intoxicated. HEENT Head: Normocephalic, occipital hematoma. Face: No facial bony tenderness. Stable midface Ears: No hemotympanum bilaterally. No Battle sign Eyes: No eye injury. PERRL. No raccoon eyes Nose: Nontender. No epistaxis. No rhinorrhea Mouth/Throat: Mucous membranes are moist. No oropharyngeal blood. No dental injury. Airway patent without stridor. Normal voice. Neck: no C-collar in place. No midline c-spine tenderness.  Cardiovascular: Normal rate, regular rhythm. Normal and symmetric distal pulses are present in all extremities. Pulmonary/Chest: Chest wall is stable and nontender to palpation/compression. Normal respiratory effort. Breath sounds are normal. No crepitus.  Abdominal: Soft, nontender, non distended. Musculoskeletal: patient is ttp over the lumbar spine midline and paraspinal regions. Nontender with normal full range of motion in all extremities. No deformities. No thoracic midline spinal tenderness. Pelvis is stable. Skin: Skin is warm, dry and intact. No abrasions or contutions. Psychiatric: Speech and behavior are appropriate. Neurological: Normal speech and language. Moves all extremities to command. No gross focal neurologic deficits are appreciated.  Glascow Coma Score: 4 - Opens eyes on own 6 - Follows simple motor commands 5 - Alert and oriented GCS: 15   ____________________________________________   LABS (all labs ordered are listed, but only abnormal results are displayed)  Labs Reviewed - No data to display ____________________________________________  EKG  none ____________________________________________  RADIOLOGY  CT head and cspine: Scalp contusion posterior aspect of the head without underlying fracture or acute intracranial abnormality. No acute abnormality cervical spine. Atrophy, chronic microvascular ischemic change and remote left anterior MCA territory infarct. Cervical spondylosis.     XR  lumbar spine: Negative for acute fracture or malalignment of the lumbar spine. Multilevel degenerative disc disease throughout the lumbar spine most pronounced at L4-S1, with associated facet disease at these levels that likely contributes to foraminal narrowing. Vascular calcifications. ____________________________________________   PROCEDURES  Procedure(s) performed: None Procedures Critical Care performed:  None ____________________________________________   INITIAL IMPRESSION / ASSESSMENT AND PLAN / ED COURSE  71 y.o. female with a history of COPD, diabetes, hypertension, hyperlipidemia, and stroke who presents for evaluation after mechanical fall. Patient has an occipital hematoma and is complaining of a headache, she also has paraspinal or midline lumbar spine tenderness on exam. Patient does not walk at baseline.  Patient is at her baseline and has full recollection of the events. There is no indication at this time that this was anything other than a mechanical fall. Patient is on Plavix. We'll do CT head and cervical spine. We'll also perform x-ray of her lumbar spine. We'll give Tylenol and oxycodone for the pain.    _________________________ 12:44 PM on 01/26/2017 -----------------------------------------  CT head and cervical spine, and x-ray of the lumbar spine with no acute findings. Pain is well controlled with by mouth medication. We'll discharge back to nursing home.   As part of my medical decision making, I reviewed the following data within the electronic MEDICAL RECORD NUMBER Nursing notes reviewed and incorporated, Radiograph reviewed , Notes from prior ED visits and [place] Controlled Substance Database    Pertinent labs & imaging results that were available during my care of the patient were reviewed by me and considered in my medical decision making (see chart for details).    ____________________________________________   FINAL CLINICAL IMPRESSION(S) / ED  DIAGNOSES  Final diagnoses:  Fall, initial encounter  Hematoma of scalp, initial encounter  Strain of lumbar region, initial encounter      NEW MEDICATIONS STARTED DURING THIS VISIT:  New Prescriptions   No medications on file     Note:  This document was prepared using Dragon voice recognition software and may include unintentional dictation errors.    Nita Sickle, MD 01/26/17 [phone]

## 2017-01-26 NOTE — ED Triage Notes (Signed)
[facility]  EMS report that patient fell last night and hit head.  Patient has a hematoma to back of head, which staff has been applying ice to overnight.  No LOC reported.  Patient is AAOx3.  MAE.  Only complaint is of a headache.

## 2017-01-26 NOTE — ED Notes (Signed)
Awake and alert. NAD 

## 2017-05-03 ENCOUNTER — Ambulatory Visit: Payer: Medicare HMO | Admitting: Podiatry

## 2017-05-03 ENCOUNTER — Encounter: Payer: Self-pay | Admitting: Podiatry

## 2017-05-03 DIAGNOSIS — M79674 Pain in right toe(s): Secondary | ICD-10-CM

## 2017-05-03 DIAGNOSIS — M79675 Pain in left toe(s): Secondary | ICD-10-CM | POA: Diagnosis not present

## 2017-05-03 DIAGNOSIS — E1149 Type 2 diabetes mellitus with other diabetic neurological complication: Secondary | ICD-10-CM

## 2017-05-03 DIAGNOSIS — B351 Tinea unguium: Secondary | ICD-10-CM | POA: Diagnosis not present

## 2017-05-03 DIAGNOSIS — Z899 Acquired absence of limb, unspecified: Secondary | ICD-10-CM

## 2017-05-03 NOTE — Progress Notes (Signed)
This patient presents to the office stating that she desires preventative foot care services.  She says that her nails are painful and long when walking and wearing her shoes.  She is unable to self treat.  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  She states that she has difficulty walking even though she has diabetic shoes.  She admits that there is no filler in her shoes at the site of the amputation.  [facility] [place] [facility] [place].  She presents the office today for an evaluation and treatment of her long thick nails. Patient presents the office with her husband in a wheelchair.   General Appearance  Alert, conversant and in no acute stress.  Vascular  Dorsalis pedis and posterior pulses are palpable  bilaterally.  Capillary return is within normal limits  bilaterally. Temperature is within normal limits  Bilaterally.  Neurologic  Senn-Weinstein monofilament wire test diminished   bilaterally. Muscle power within normal limits bilaterally.  Nails Thick disfigured discolored nails with subungual debris  from hallux to fifth toes right foot and 1-3 left foot.. No evidence of bacterial infection or drainage bilaterally.  Orthopedic  No limitations of motion of motion feet bilaterally.  No crepitus or effusions noted.  No bony pathology or digital deformities noted. Amputation of the fourth and fifth rays left foot.  Skin  normotropic skin with no porokeratosis noted bilaterally.  No signs of infections or ulcers noted.    Onychomycosis  X 8.  ROV  Debridement of nails  X 8.  RTC 3 months.  Discussed receiving diabetic shoes with fillers with this patient.  If she has any problems obtaining shoes. She is to return to the office where she will be evaluated by Raiford Nobleick.   Helane GuntherGregory Ibrahim Mcpheeters DPM

## 2017-08-02 ENCOUNTER — Ambulatory Visit: Payer: Medicare HMO | Admitting: Podiatry

## 2017-08-02 ENCOUNTER — Encounter: Payer: Self-pay | Admitting: Podiatry

## 2017-08-02 DIAGNOSIS — E1149 Type 2 diabetes mellitus with other diabetic neurological complication: Secondary | ICD-10-CM | POA: Diagnosis not present

## 2017-08-02 DIAGNOSIS — B351 Tinea unguium: Secondary | ICD-10-CM

## 2017-08-02 DIAGNOSIS — M79674 Pain in right toe(s): Secondary | ICD-10-CM | POA: Diagnosis not present

## 2017-08-02 DIAGNOSIS — Z899 Acquired absence of limb, unspecified: Secondary | ICD-10-CM

## 2017-08-02 DIAGNOSIS — M79675 Pain in left toe(s): Secondary | ICD-10-CM | POA: Diagnosis not present

## 2017-08-02 NOTE — Progress Notes (Signed)
This patient presents to the office stating that she desires preventative foot care services.  She says that her nails are painful and long when walking and wearing her shoes.  She is unable to self treat.  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  .  She presents the office today for an evaluation and treatment of her long thick nails. Patient presents the office with her husband in a wheelchair.   General Appearance  Alert, conversant and in no acute stress.  Vascular  Dorsalis pedis and posterior pulses are palpable  bilaterally.  Capillary return is within normal limits  bilaterally. Temperature is within normal limits  Bilaterally.  Neurologic  Senn-Weinstein monofilament wire test diminished   bilaterally. Muscle power within normal limits bilaterally.  Nails Thick disfigured discolored nails with subungual debris  from hallux to fifth toes right foot and 1-3 left foot.. No evidence of bacterial infection or drainage bilaterally.  Orthopedic  No limitations of motion of motion feet bilaterally.  No crepitus or effusions noted.  No bony pathology or digital deformities noted. Amputation of the fourth and fifth rays left foot.  Skin  normotropic skin with no porokeratosis noted bilaterally.  No signs of infections or ulcers noted.    Onychomycosis  X 8.  ROV  Debridement of nails  X 8.  RTC 4 months.    Brylinn Teaney DPM   

## 2017-10-21 ENCOUNTER — Encounter: Payer: Self-pay | Admitting: Emergency Medicine

## 2017-10-21 ENCOUNTER — Emergency Department: Payer: Medicare HMO

## 2017-10-21 ENCOUNTER — Emergency Department
Admission: EM | Admit: 2017-10-21 | Discharge: 2017-10-21 | Disposition: A | Discharge status: Home / Self Care | Payer: Medicare HMO | Attending: Emergency Medicine | Admitting: Emergency Medicine

## 2017-10-21 ENCOUNTER — Other Ambulatory Visit: Payer: Self-pay

## 2017-10-21 DIAGNOSIS — I10 Essential (primary) hypertension: Secondary | ICD-10-CM | POA: Insufficient documentation

## 2017-10-21 DIAGNOSIS — Z794 Long term (current) use of insulin: Secondary | ICD-10-CM | POA: Insufficient documentation

## 2017-10-21 DIAGNOSIS — Y998 Other external cause status: Secondary | ICD-10-CM | POA: Diagnosis not present

## 2017-10-21 DIAGNOSIS — Z79899 Other long term (current) drug therapy: Secondary | ICD-10-CM | POA: Insufficient documentation

## 2017-10-21 DIAGNOSIS — J449 Chronic obstructive pulmonary disease, unspecified: Secondary | ICD-10-CM | POA: Diagnosis not present

## 2017-10-21 DIAGNOSIS — Y92003 Bedroom of unspecified non-institutional (private) residence as the place of occurrence of the external cause: Secondary | ICD-10-CM | POA: Diagnosis not present

## 2017-10-21 DIAGNOSIS — Z7982 Long term (current) use of aspirin: Secondary | ICD-10-CM | POA: Insufficient documentation

## 2017-10-21 DIAGNOSIS — Z87891 Personal history of nicotine dependence: Secondary | ICD-10-CM | POA: Diagnosis not present

## 2017-10-21 DIAGNOSIS — Y9389 Activity, other specified: Secondary | ICD-10-CM | POA: Insufficient documentation

## 2017-10-21 DIAGNOSIS — W06XXXA Fall from bed, initial encounter: Secondary | ICD-10-CM | POA: Diagnosis not present

## 2017-10-21 DIAGNOSIS — E119 Type 2 diabetes mellitus without complications: Secondary | ICD-10-CM | POA: Insufficient documentation

## 2017-10-21 DIAGNOSIS — S0990XA Unspecified injury of head, initial encounter: Secondary | ICD-10-CM | POA: Diagnosis present

## 2017-10-21 DIAGNOSIS — W19XXXA Unspecified fall, initial encounter: Secondary | ICD-10-CM

## 2017-10-21 DIAGNOSIS — S0101XA Laceration without foreign body of scalp, initial encounter: Secondary | ICD-10-CM | POA: Diagnosis not present

## 2017-10-21 NOTE — Discharge Instructions (Addendum)
Your CT scan of the head was unremarkable.

## 2017-10-21 NOTE — ED Notes (Signed)
Pt cleaned and new diaper applied.  

## 2017-10-21 NOTE — ED Provider Notes (Signed)
[facility] Emergency Department Provider Note  ____________________________________________  Time seen: Approximately 1:05 PM  I have reviewed the triage vital signs and the nursing notes.   HISTORY  Chief Complaint Fall    HPI Molly [name] is a 72 y.o. female with a history of hypertension COPD and stroke with chronic left-sided weakness who was in her usual state of health when she was getting out of bed this morning.  She reports that her bed is not properly supported and there is a dip in the mattress that tends to cause her to lose her balance.  This morning she found her way into the DIP and fell off the bed onto her face.  No loss of consciousness, no vomiting or vision changes.  No neck pain weakness or paresthesias.   She is having pain on the right side of the head near her injury.  Not on anticoagulants but does take Plavix.   Past Medical History:  Diagnosis Date  . Anxiety   . COPD (chronic obstructive pulmonary disease) (HCC)   . Diabetes (HCC)   . Hemorrhoid   . HLD (hyperlipidemia)   . HTN (hypertension)   . Stroke [facility]) 2008   left weakness     Patient Active Problem List   Diagnosis Date Noted  . Pressure injury of skin 04/28/2016  . Acute GI bleeding 04/27/2016  . Anemia, posthemorrhagic, acute   . Blood in stool   . Gastritis without bleeding   . Involuntary movements   . Acute respiratory failure (HCC) 03/05/2016  . Acute encephalopathy 02/06/2016  . Acute cerebrovascular accident (CVA) (HCC)   . Altered mental status   . 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 Cellulitis of left lower extremity 11/19/2014  . COPD (chronic obstructive pulmonary disease) (HCC) 11/19/2014  . HTN (hypertension) 11/19/2014  . HLD (hyperlipidemia) 11/19/2014  . Diabetes mellitus (HCC) 05/21/2014  . Chronic anemia 05/21/2014     Past Surgical History:  Procedure Laterality Date  . AMPUTATION Left 05/22/2014   Procedure: AMPUTATION RAY LEFT FOURTH  AND FIFTH ;  Surgeon: Toni Arthurs, MD;  Location: [facility] OR;  Service: Orthopedics;  Laterality: Left;  . CATARACT EXTRACTION    . COLONOSCOPY  2004  . COLONOSCOPY WITH PROPOFOL N/A 04/29/2016   Procedure: COLONOSCOPY WITH PROPOFOL;  Surgeon: Wyline Mood, MD;  Location: [facility] ENDOSCOPY;  Service: Gastroenterology;  Laterality: N/A;  . ESOPHAGOGASTRODUODENOSCOPY (EGD) WITH PROPOFOL N/A 04/27/2016   Procedure: ESOPHAGOGASTRODUODENOSCOPY (EGD) WITH PROPOFOL;  Surgeon: Midge Minium, MD;  Location: [facility] ENDOSCOPY;  Service: Endoscopy;  Laterality: N/A;     Prior to Admission medications   Medication Sig Start Date End Date Taking? Authorizing Provider  acetaminophen (TYLENOL) 325 MG tablet Take 650 mg by mouth every 6 (six) hours as needed.    [provider]  albuterol (PROVENTIL HFA;VENTOLIN HFA) 108 (90 Base) MCG/ACT inhaler ALBUTEROL 90 MCG/ACT AERS 05/20/09   [provider]  amitriptyline (ELAVIL) 75 MG tablet Take 75 mg by mouth.     [provider]  aspirin EC 81 MG tablet ASPIRIN 81 MG TABS 02/28/09   [provider]  atorvastatin (LIPITOR) 40 MG tablet Take 1 tablet (40 mg total) by mouth daily at 6 PM. 02/03/16   Enid Baas, MD  B Complex-C (SUPER B COMPLEX PO) Take 1 tablet by mouth daily.     [provider]  bisacodyl (DULCOLAX) 5 MG EC tablet Take 1 tablet (5 mg total) by mouth daily as needed for moderate  constipation. Patient not taking: Reported on 01/26/2017 01/16/16   Ramonita LabGouru, Aruna, MD  calcium carbonate (OS-CAL) 600 MG TABS tablet Take 600 mg by mouth 2 (two) times daily.     [provider]  Cholecalciferol (VITAMIN D3) 1000 UNITS CAPS Take 1,000 Units by mouth daily.    [provider]  clopidogrel (PLAVIX) 75 MG tablet Take 75 mg by mouth.    [provider]  diazepam (VALIUM) 2 MG tablet Take 1 tablet (2 mg total) by mouth daily. Patient not taking: Reported on 01/26/2017 03/12/16   Adrian SaranMody, Sital, MD   diclofenac sodium (VOLTAREN) 1 % GEL Apply 4 g topically 4 (four) times daily.    [provider]  donepezil (ARICEPT) 5 MG tablet Take by mouth. 03/25/16 06/23/16  [provider]  ferrous gluconate (FERGON) 324 MG tablet Take 324 mg by mouth daily with breakfast.    [provider]  insulin detemir (LEVEMIR) 100 UNIT/ML injection Inject 0.1 mLs (10 Units total) into the skin at bedtime. Patient taking differently: Inject 10 Units into the skin daily. 10 units every morning, 45 units at bedtime 03/12/16   Adrian SaranMody, Sital, MD  Insulin Pen Needle (FIFTY50 PEN NEEDLES) 31G X 8 MM MISC BD PEN NEEDLE SHORT U/F 31G X 8 MM MISC 06/14/12   [provider]  Lancet Devices ([employer] LANCING DEVICE) MISC ACCU-CHEK MULTICLIX LANCETS MISC 11/30/12   [provider]  losartan (COZAAR) 25 MG tablet Take 25 mg by mouth.     [provider]  magnesium oxide (MAG-OX) 400 MG tablet Take 400 mg by mouth 2 (two) times daily.    [provider]  meclizine (ANTIVERT) 25 MG tablet Take 1 tablet (25 mg total) by mouth 3 (three) times daily. Patient not taking: Reported on 01/26/2017 01/16/16   Ramonita LabGouru, Aruna, MD  Melatonin 10 MG TBDP Take 10 mg by mouth at bedtime.    [provider]  mesalamine (LIALDA) 1.2 g EC tablet Take 4 tablets (4.8 g total) by mouth daily with breakfast. Patient not taking: Reported on 01/26/2017 05/01/16   Milagros LollSudini, Srikar, MD  mesalamine (ROWASA) 4 g enema Place 60 mLs (4 g total) rectally at bedtime. Patient not taking: Reported on 01/26/2017 04/30/16   Milagros LollSudini, Srikar, MD  metFORMIN (GLUCOPHAGE) 500 MG tablet Take 1,000 mg by mouth.    [provider]  naproxen (NAPROSYN) 250 MG tablet Take 250 mg by mouth.    [provider]  polyethylene glycol (MIRALAX / GLYCOLAX) packet Take 17 g by mouth daily.    [provider]  triamcinolone cream (KENALOG) 0.1 % Apply 1 application topically 2 (two) times daily. Behind  right ear    [provider]     Allergies Patient has no known allergies.   Family History  Problem Relation Age of Onset  . Hyperlipidemia Mother   . Varicose Veins Mother   . Deep vein thrombosis Father   . Stroke Father   . Breast cancer Paternal Aunt   . Stroke Maternal Grandmother   . Stroke Paternal Grandmother     Social History Social History   Tobacco Use  . Smoking status: Former Smoker    Packs/day: 0.50    Years: 2.00    Pack years: 1.00    Types: Cigarettes    Last attempt to quit: 11/18/1968    Years since quitting: 48.9  . Smokeless tobacco: Never Used  Substance Use Topics  . Alcohol use: No  Alcohol/week: 0.0 oz    Comment: seldom  . Drug use: No    Review of Systems  Constitutional:   No fever or chills.  ENT:   No sore throat. No rhinorrhea. Cardiovascular:   No chest pain or syncope. Respiratory:   No dyspnea or cough. Gastrointestinal:   Negative for abdominal pain, vomiting and diarrhea.  Musculoskeletal:   Negative for focal pain or swelling All other systems reviewed and are negative except as documented above in ROS and HPI.  ____________________________________________   PHYSICAL EXAM:  VITAL SIGNS: ED Triage Vitals  Enc Vitals Group     BP 10/21/17 0941 (!) 147/74     Pulse Rate 10/21/17 0941 94     Resp 10/21/17 0941 16     Temp 10/21/17 0941 97.6 F (36.4 C)     Temp Source 10/21/17 0941 Oral     SpO2 10/21/17 0941 95 %     Weight 10/21/17 0942 200 lb (90.7 kg)     Height 10/21/17 0942 5\' 6"  (1.676 m)     Head Circumference --      Peak Flow --      Pain Score 10/21/17 0942 7     Pain Loc --      Pain Edu? --      Excl. in GC? --     Vital signs reviewed, nursing assessments reviewed.   Constitutional:   Alert and oriented. Non-toxic appearance. Eyes:   Conjunctivae are normal. EOMI. PERRL. ENT      Head:   Normocephalic with less than 1 cm superficial laceration over the right posterior parietal  scalp.  Hemostatic.  No visible foreign bodies.      Nose:   No congestion/rhinnorhea.       Mouth/Throat:   MMM, no pharyngeal erythema. No peritonsillar mass.       Neck:   No meningismus. Full ROM.  No midline spinal tenderness. Hematological/Lymphatic/Immunilogical:   No cervical lymphadenopathy. Cardiovascular:   RRR. Symmetric bilateral radial and DP pulses.  No murmurs. Cap refill less than 2 seconds. Respiratory:   Normal respiratory effort without tachypnea/retractions. Breath sounds are clear and equal bilaterally. No wheezes/rales/rhonchi. Gastrointestinal:   Soft and nontender. Non distended. There is no CVA tenderness.  No rebound, rigidity, or guarding. Genitourinary:   deferred Musculoskeletal:   Normal range of motion in all extremities. No joint effusions.  No lower extremity tenderness.  No edema. Neurologic:   Normal speech and language.  Motor grossly intact. No acute focal neurologic deficits are appreciated.  Skin:    Skin is warm, dry and intact. No rash noted.  No petechiae, purpura, or bullae.  ____________________________________________    LABS (pertinent positives/negatives) (all labs ordered are listed, but only abnormal results are displayed) Labs Reviewed - No data to display ____________________________________________   EKG  Interpreted by me Sinus rhythm rate of 93, right axis, normal intervals.  Right bundle branch block.  No acute ischemic changes.  ____________________________________________    RADIOLOGY  Ct Head Wo Contrast  Result Date: 10/21/2017 CLINICAL DATA:  72 year old with head trauma. Intracranial venous injury suspected. EXAM: CT HEAD WITHOUT CONTRAST TECHNIQUE: Contiguous axial images were obtained from the base of the skull through the vertex without intravenous contrast. COMPARISON:  01/26/2017 FINDINGS: Brain: Stable cerebral atrophy. Old infarct in the left frontal lobe. Evidence for old infarct involving the right posterior  internal capsule region. Old infarct involving the left insular cortex. Suspect wallerian degeneration involving the right cerebral peduncle. Negative  for an acute hemorrhage, mass lesion, midline shift, hydrocephalus or new infarct. Extensive low density throughout the periventricular white matter is unchanged. Vascular: Atherosclerotic calcifications involving the vertebral arteries. Skull: Scalp hematoma along the right posterior scalp. No underlying fracture. Sinuses/Orbits: Small amount of fluid or opacification in the right mastoid air cells. Minimal mucosal disease in the right ethmoid air cells and left maxillary sinus. Other: None IMPRESSION: No acute intracranial abnormality. Atrophy and evidence of old ischemic disease and infarcts. Right scalp hematoma without a fracture. Electronically Signed   By: Richarda Overlie M.D.   On: 10/21/2017 10:49    ____________________________________________   PROCEDURES Procedures  ____________________________________________  DIFFERENTIAL DIAGNOSIS   Subdural hematoma, epidural hematoma  CLINICAL IMPRESSION / ASSESSMENT AND PLAN / ED COURSE  Pertinent labs & imaging results that were available during my care of the patient were reviewed by me and considered in my medical decision making (see chart for details).    Patient presents with head trauma after a fall.  She is on Plavix increasing her risk of intracranial bleeding.  Her exam is unremarkable, reassuring, low likelihood of C-spine injury.  She declines repair of the laceration.  Obtain CT scan.  Clinical Course as of Oct 22 1303  Thu Oct 21, 2017  1013 Tetanus shot up to date in last 3 years.   [PS]    Clinical Course User Index [PS] Sharman Cheek, MD     ----------------------------------------- 1:05 PM on 10/21/2017 -----------------------------------------  CT head negative for fx or ICH. Will DC home to outpatient f/u. Pt declines repair of right scalp laceration which is  less than 1cm big, not gaping, hemostatic.   ____________________________________________   FINAL CLINICAL IMPRESSION(S) / ED DIAGNOSES    Final diagnoses:  Fall, initial encounter  Laceration of scalp, initial encounter     ED Discharge Orders    None      Portions of this note were generated with dragon dictation software. Dictation errors may occur despite best attempts at proofreading.    Sharman Cheek, MD 10/21/17 1348

## 2017-10-21 NOTE — ED Triage Notes (Signed)
Pt to ED via EMS from home with c/o falling out of bed this am. PT has laceration noted to back of head, pt takes plavix. P[t A&OX4, VSS

## 2017-11-15 IMAGING — CT CT MAXILLOFACIAL W/O CM
3 series · 16 of 47 positions shown, 19 images · non-contrast
Comparison: None.

CLINICAL DATA: Patient fell with 7 days ago. Patient hit LEFT side
of face. Bruising LEFT eye.

EXAM:
CT MAXILLOFACIAL WITHOUT CONTRAST
TECHNIQUE: Multidetector CT imaging of the maxillofacial structures was
performed. Multiplanar CT image reconstructions were also generated.
A small metallic BB was placed on the right temple in order to
reliably differentiate right from left.

[Series 2: max soft · axial · 0.36mm/px · z∈[-106,+42]mm · 10 of 86 slices shown, 13 images]
[im 6/86  brain]
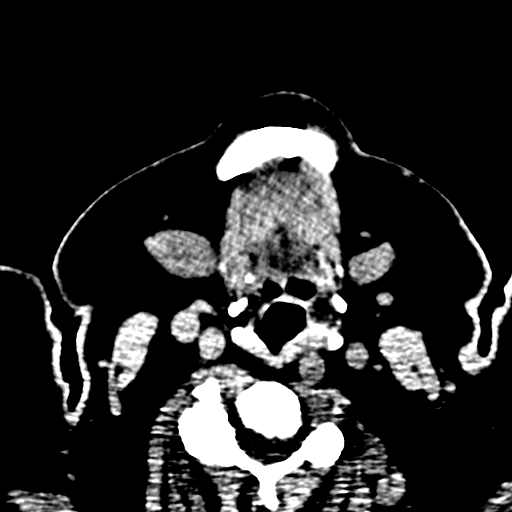
[im 6/86  bone]
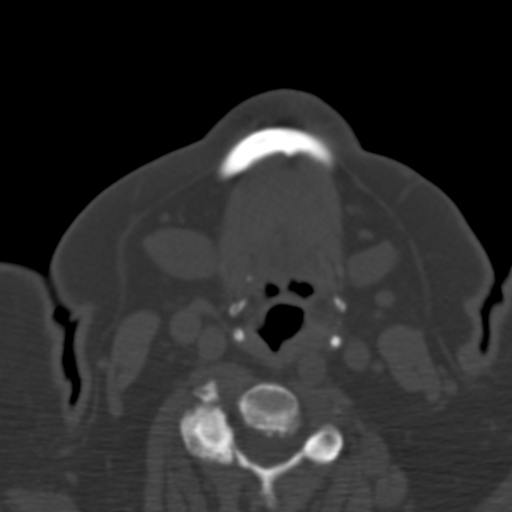
[im 15/86  bone]
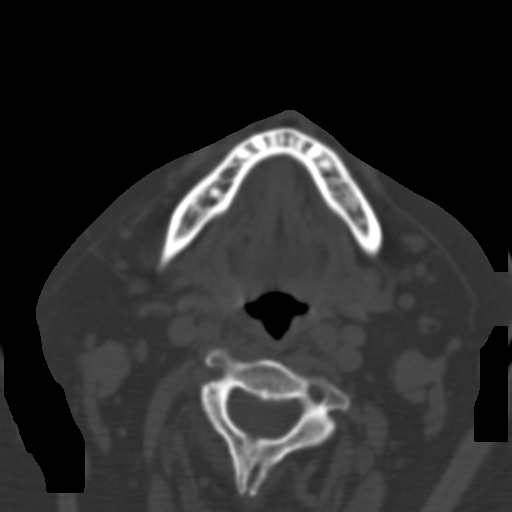
[im 24/86  bone]
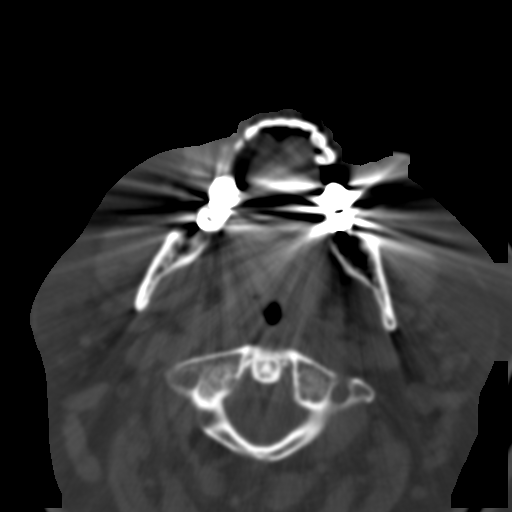
[im 30/86  bone]
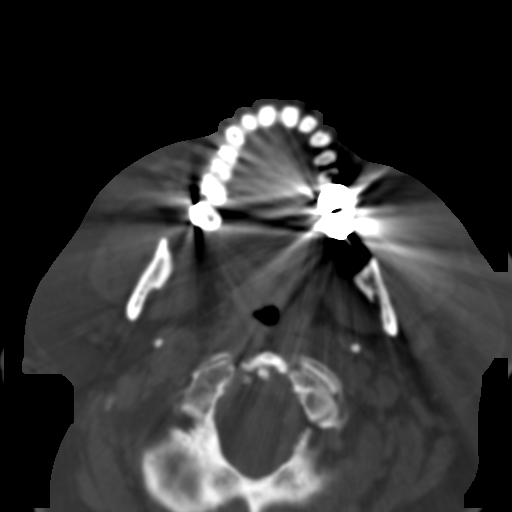
[im 39/86  brain]
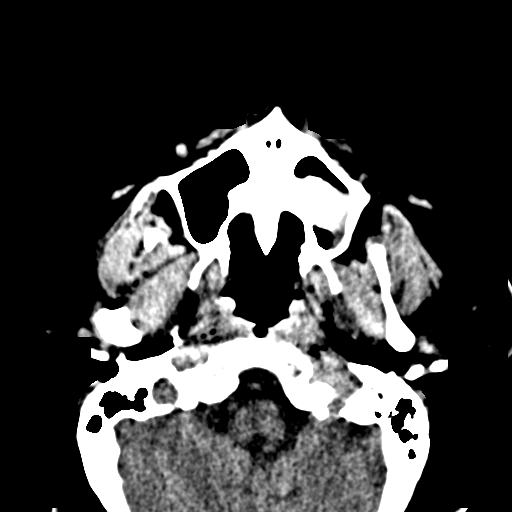
[im 39/86  bone]
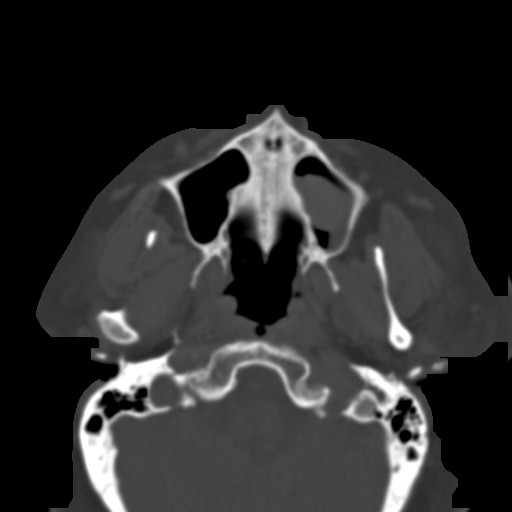
[im 47/86  bone]
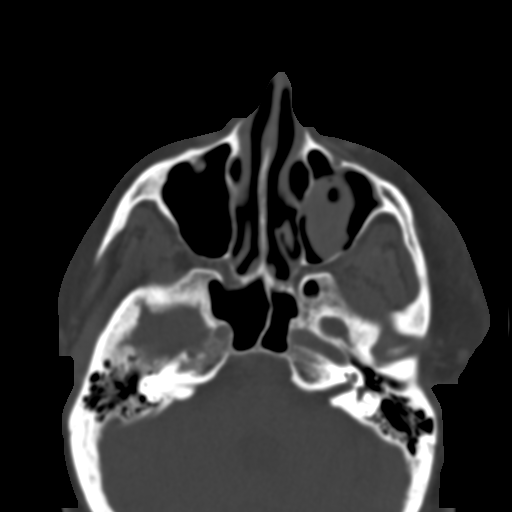
[im 56/86  bone]
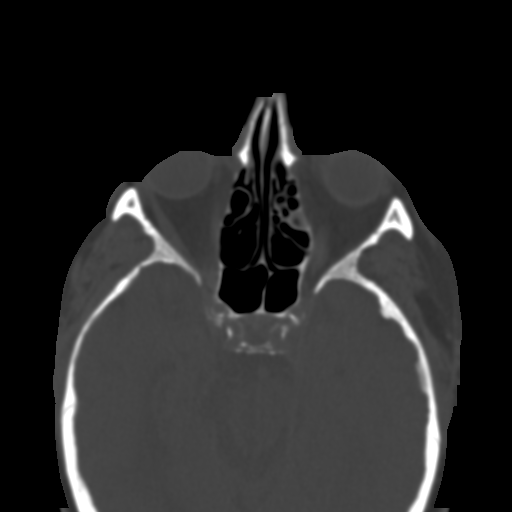
[im 65/86  bone]
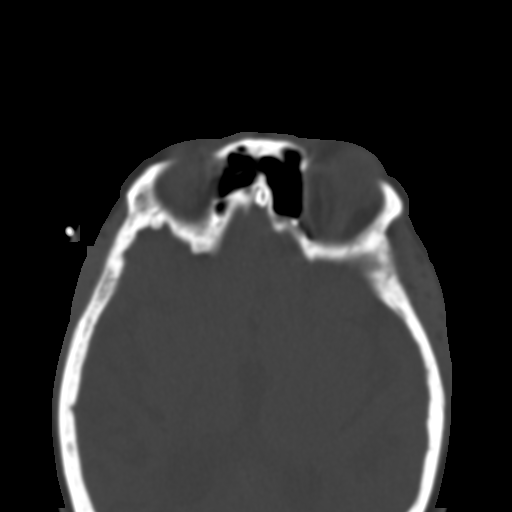
[im 71/86  brain]
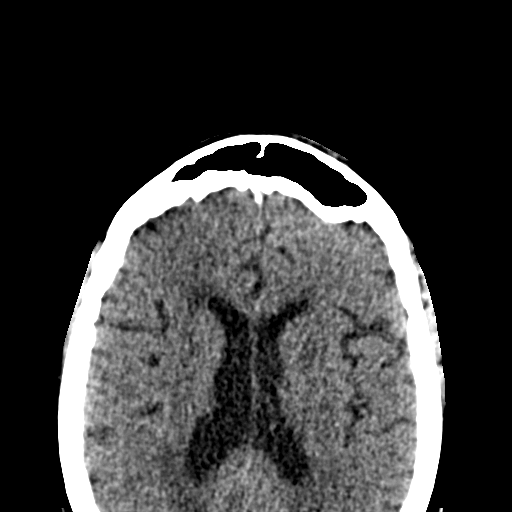
[im 71/86  bone]
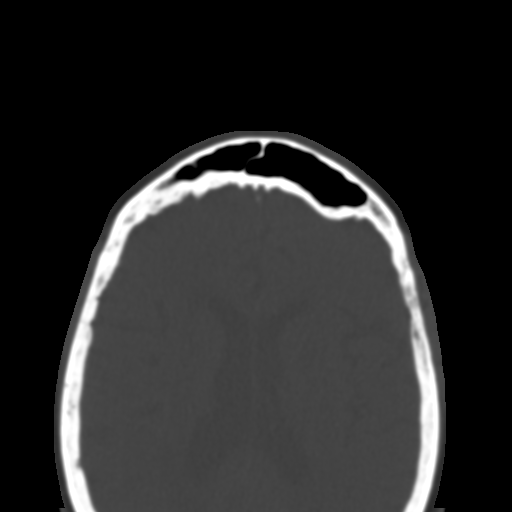
[im 80/86  bone]
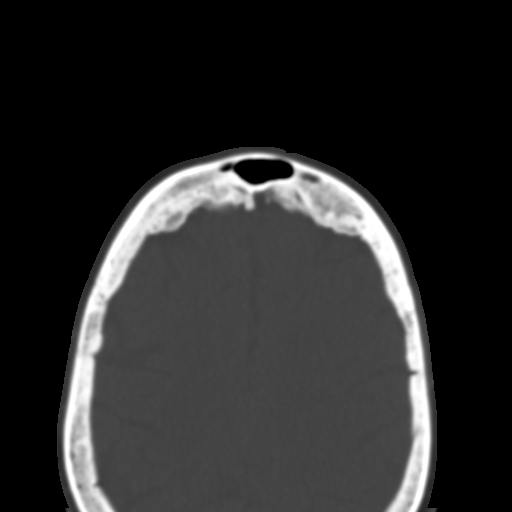

[Series 4: coronal soft · coronal · 0.38mm/px · 3 of 81 slices shown]
[im 27/81  bone]
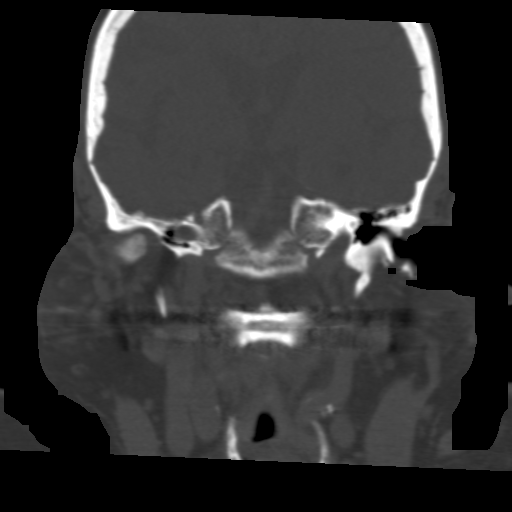
[im 36/81  bone]
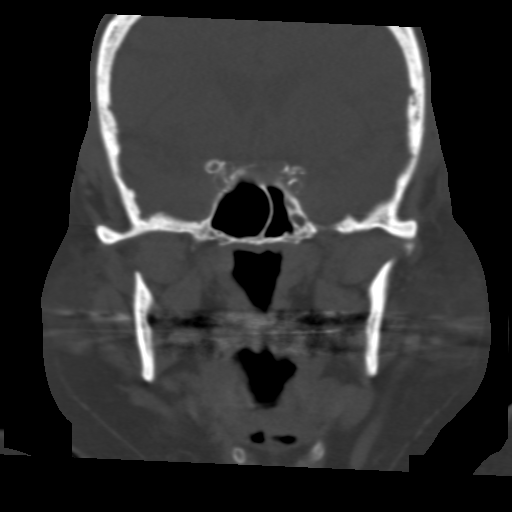
[im 45/81  bone]
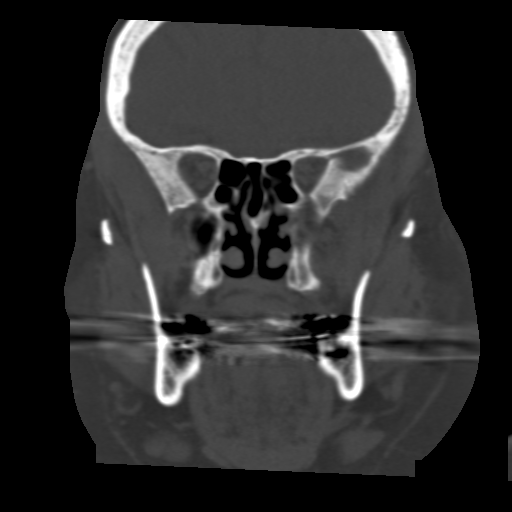

[Series 5: sagittal soft · sagittal · 0.35mm/px · 3 of 84 slices shown]
[im 28/84  bone]
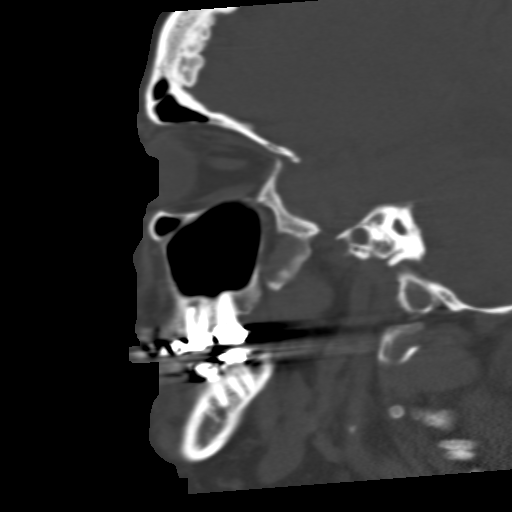
[im 42/84  bone]
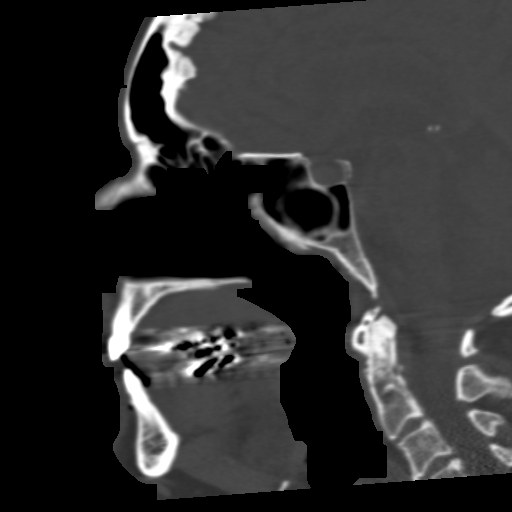
[im 56/84  bone]
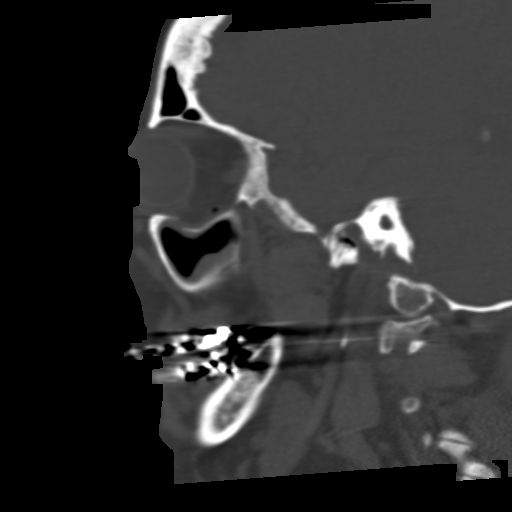

[16 of 47 positions shown; findings below may reference images not displayed]

FINDINGS: There is an inferior blowout fracture of the LEFT orbit. There is
depression of the orbital floor measuring 6 mm. There is herniation
of orbital fat downward into the sinus. The inferior oblique muscle
is not depressed. The inferior rectus muscle is somewhat low-lying,
with the belly alongside the edge of the bony defect. Orbital
emphysema is present. No intraconal inflammation. No postseptal
hemorrhage. No medial blowout fracture although some fluid is seen
in the LEFT ethmoid air cells. LEFT malar soft tissue swelling.

Significant LEFT maxillary hemosinus. The anterior wall of the LEFT
maxillary sinus is intact as are the nasal bones. No lateral wall
fracture of significance. Zygoma is intact. No sphenoid sinus
disease. No frontal sinus air-fluid level. Within the intracranial
compartment visualized, there is cerebral atrophy. No
pneumocephalus. No skull or skull base fracture. Vascular
calcification.
IMPRESSION: Inferior blowout fracture of the LEFT orbit. 6 mm of orbital floor
depression. No entrapment on CT although the inferior rectus is
somewhat low lying. Orbital emphysema without postseptal hemorrhage
or intraconal inflammation.

Findings discussed with ordering provider by myself at the time of
interpretation.

## 2017-12-06 ENCOUNTER — Ambulatory Visit: Payer: Medicare HMO | Admitting: Podiatry

## 2017-12-06 ENCOUNTER — Encounter: Payer: Self-pay | Admitting: Podiatry

## 2017-12-06 DIAGNOSIS — Z899 Acquired absence of limb, unspecified: Secondary | ICD-10-CM | POA: Diagnosis not present

## 2017-12-06 DIAGNOSIS — E1149 Type 2 diabetes mellitus with other diabetic neurological complication: Secondary | ICD-10-CM

## 2017-12-06 DIAGNOSIS — M79675 Pain in left toe(s): Secondary | ICD-10-CM

## 2017-12-06 DIAGNOSIS — M79674 Pain in right toe(s): Secondary | ICD-10-CM

## 2017-12-06 DIAGNOSIS — B351 Tinea unguium: Secondary | ICD-10-CM

## 2017-12-06 NOTE — Progress Notes (Signed)
This patient presents to the office stating that she desires preventative foot care services.  She says that her nails are painful and long when walking and wearing her shoes.  She is unable to self treat.  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  .  She presents the office today for an evaluation and treatment of her long thick nails. Patient presents the office with her husband in a wheelchair.   General Appearance  Alert, conversant and in no acute stress.  Vascular  Dorsalis pedis and posterior pulses are palpable  bilaterally.  Capillary return is within normal limits  bilaterally. Temperature is within normal limits  Bilaterally.  Neurologic  Senn-Weinstein monofilament wire test diminished   bilaterally. Muscle power within normal limits bilaterally.  Nails Thick disfigured discolored nails with subungual debris  from hallux to fifth toes right foot and 1-3 left foot.. No evidence of bacterial infection or drainage bilaterally.  Orthopedic  No limitations of motion of motion feet bilaterally.  No crepitus or effusions noted.  No bony pathology or digital deformities noted. Amputation of the fourth and fifth rays left foot.  Skin  normotropic skin with no porokeratosis noted bilaterally.  No signs of infections or ulcers noted.    Onychomycosis  X 8.  ROV  Debridement of nails  X 8.  RTC 4 months.    Helane Gunther DPM

## 2018-03-08 ENCOUNTER — Ambulatory Visit (INDEPENDENT_AMBULATORY_CARE_PROVIDER_SITE_OTHER): Payer: Medicare HMO

## 2018-03-08 ENCOUNTER — Encounter: Payer: Self-pay | Admitting: Podiatry

## 2018-03-08 ENCOUNTER — Other Ambulatory Visit: Payer: Self-pay | Admitting: Podiatry

## 2018-03-08 ENCOUNTER — Ambulatory Visit: Payer: Medicare HMO | Admitting: Podiatry

## 2018-03-08 DIAGNOSIS — L97522 Non-pressure chronic ulcer of other part of left foot with fat layer exposed: Secondary | ICD-10-CM

## 2018-03-08 DIAGNOSIS — E0843 Diabetes mellitus due to underlying condition with diabetic autonomic (poly)neuropathy: Secondary | ICD-10-CM | POA: Diagnosis not present

## 2018-03-08 DIAGNOSIS — M79672 Pain in left foot: Secondary | ICD-10-CM

## 2018-03-08 DIAGNOSIS — I70245 Atherosclerosis of native arteries of left leg with ulceration of other part of foot: Secondary | ICD-10-CM

## 2018-03-08 MED ORDER — DOXYCYCLINE HYCLATE 100 MG PO TABS
100.0000 mg | ORAL_TABLET | Freq: Two times a day (BID) | ORAL | 0 refills | Status: DC
Start: 2018-03-08 — End: 2019-09-21

## 2018-03-08 MED ORDER — GENTAMICIN SULFATE 0.1 % EX CREA: 1.0000 "application " | TOPICAL_CREAM | Freq: Two times a day (BID) | CUTANEOUS | 1 refills | Status: DC

## 2018-03-09 NOTE — Progress Notes (Signed)
   Subjective:  72 year old female with PMHx of DM presenting today with a chief complaint of an wound to the lateral left foot that she noticed about ten days ago. She states she had her fifth digit removed about two years ago and states the wound is in the same area. She has not done anything for treatment. She denies modifying factors. Patient is here for further evaluation and treatment.   Past Medical History:  Diagnosis Date  . Anxiety   . COPD (chronic obstructive pulmonary disease) (HCC)   . Diabetes (HCC)   . Hemorrhoid   . HLD (hyperlipidemia)   . HTN (hypertension)   . Stroke [facility](HCC) 2008   left weakness     Objective/Physical Exam General: The patient is alert and oriented x3 in no acute distress.  Dermatology:  Wound #1 noted to the lateral left foot measuring 7.0 x 5.0 x 0.1 cm (LxWxD).   To the noted ulceration(s), there is no eschar. There is a moderate amount of slough, fibrin, and necrotic tissue noted. Granulation tissue and wound base is red. There is a minimal amount of serosanguineous drainage noted. There is no exposed bone muscle-tendon ligament or joint. There is no malodor. Periwound integrity is intact. Skin is warm, dry and supple bilateral lower extremities.  Vascular: Palpable pedal pulses bilaterally. No edema or erythema noted. Capillary refill within normal limits.  Neurological: Epicritic and protective threshold diminished bilaterally.   Musculoskeletal Exam: Range of motion within normal limits to all pedal and ankle joints bilateral. Muscle strength 5/5 in all groups bilateral.   Radiographic Exam:  Normal osseous mineralization. Joint spaces preserved. No fracture/dislocation/boney destruction.    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 X-Rays reviewed.  #2 medically necessary  excisional debridement including subcutaneous tissue was performed using a tissue nipper and a chisel blade. Excisional debridement of all the necrotic nonviable tissue down to healthy bleeding viable tissue was performed with post-debridement measurements same as pre-. #3 the wound was cleansed and dry sterile dressing applied. #4 Cultures taken from wound.  #5 Prescription for Doxycycline provided to patient.  #6 Prescription for Gentamicin cream provided to patient to use daily with a bandage.  #7 Post op shoe dispensed.  #8 patient is to return to clinic in 2 weeks.   Felecia ShellingBrent M. Schawn Byas, DPM [facility]  Dr. Felecia ShellingBrent M. Aryssa Rosamond, DPM    [address]                                        [place], [place] [address]                Office [phone]  Fax [phone]

## 2018-03-10 ENCOUNTER — Ambulatory Visit: Payer: Medicare HMO | Admitting: Podiatry

## 2018-03-11 LAB — WOUND CULTURE: ORGANISM ID, BACTERIA: NONE SEEN

## 2018-03-28 ENCOUNTER — Other Ambulatory Visit: Payer: Self-pay | Admitting: Family Medicine

## 2018-03-28 DIAGNOSIS — Z1231 Encounter for screening mammogram for malignant neoplasm of breast: Secondary | ICD-10-CM

## 2018-04-05 ENCOUNTER — Encounter: Payer: Self-pay | Admitting: Podiatry

## 2018-04-05 ENCOUNTER — Ambulatory Visit (INDEPENDENT_AMBULATORY_CARE_PROVIDER_SITE_OTHER): Payer: Medicare HMO | Admitting: Podiatry

## 2018-04-05 DIAGNOSIS — M21172 Varus deformity, not elsewhere classified, left ankle: Secondary | ICD-10-CM

## 2018-04-05 DIAGNOSIS — E0843 Diabetes mellitus due to underlying condition with diabetic autonomic (poly)neuropathy: Secondary | ICD-10-CM

## 2018-04-05 DIAGNOSIS — I70245 Atherosclerosis of native arteries of left leg with ulceration of other part of foot: Secondary | ICD-10-CM

## 2018-04-05 DIAGNOSIS — L97522 Non-pressure chronic ulcer of other part of left foot with fat layer exposed: Secondary | ICD-10-CM

## 2018-04-12 NOTE — Progress Notes (Signed)
   Subjective:  73 year old female with PMHx of DM presenting today for follow-up evaluation regarding an ulceration of the lateral aspect of the left foot secondary to diabetes mellitus.  Patient completed her oral antibiotic doxycycline as prescribed on last visit and has been applying gentamicin cream and a dressing as well as weightbearing in surgical shoe.  Patient says that the wound has some minimal improvement over the past few weeks.  She presents for further treatment evaluation  Past Medical History:  Diagnosis Date  . Anxiety   . COPD (chronic obstructive pulmonary disease) (HCC)   . Diabetes (HCC)   . Hemorrhoid   . HLD (hyperlipidemia)   . HTN (hypertension)   . Stroke [facility]) 2008   left weakness     Objective/Physical Exam General: The patient is alert and oriented x3 in no acute distress.  Dermatology:  Wound #1 noted to the lateral left foot measuring 6.04.0 x 0.2 cm (LxWxD).   To the noted ulceration(s), there is no eschar. There is a moderate amount of slough, fibrin, and necrotic tissue noted. Granulation tissue and wound base is red. There is a minimal amount of serosanguineous drainage noted. There is no exposed bone muscle-tendon ligament or joint. There is no malodor. Periwound integrity is intact. Skin is warm, dry and supple bilateral lower extremities.  Vascular: Palpable pedal pulses bilaterally. No edema or erythema noted. Capillary refill within normal limits.  Neurological: Epicritic and protective threshold diminished bilaterally.   Musculoskeletal Exam: Range of motion within normal limits to all pedal and ankle joints bilateral. Muscle strength 5/5 in all groups bilateral.  There is a varus deformity noted to the left lower extremity secondary to the fifth ray amputation the patient had prior, with likely compromised her peroneal tendons and loss of eversion and muscle balance.  This seems to be contributing to her ulceration  Radiographic Exam:    Normal osseous mineralization. Joint spaces preserved. No fracture/dislocation/boney destruction.    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 X-Rays reviewed.  #2 medically necessary excisional debridement including subcutaneous tissue was performed using a tissue nipper and a chisel blade. Excisional debridement of all the necrotic nonviable tissue down to healthy bleeding viable tissue was performed with post-debridement measurements same as pre-. #3 the wound was cleansed and dry sterile dressing applied. #4  Recommend daily Betadine and dry sterile dressing #5 today immobilization cam boot was dispensed.  Patient states that the postoperative shoe does not align or correct for her varus deformity #6 return to clinic in 3 weeks   Felecia Shelling, DPM [facility]  Dr. Felecia Shelling, DPM    [address]                                        [place], [place] [address]                Office [phone]  Fax [phone]

## 2018-04-26 ENCOUNTER — Ambulatory Visit (INDEPENDENT_AMBULATORY_CARE_PROVIDER_SITE_OTHER): Payer: Medicare HMO

## 2018-04-26 ENCOUNTER — Ambulatory Visit: Payer: Medicare HMO | Admitting: Podiatry

## 2018-04-26 ENCOUNTER — Other Ambulatory Visit: Payer: Self-pay | Admitting: Podiatry

## 2018-04-26 ENCOUNTER — Encounter: Payer: Self-pay | Admitting: Podiatry

## 2018-04-26 DIAGNOSIS — I70245 Atherosclerosis of native arteries of left leg with ulceration of other part of foot: Secondary | ICD-10-CM

## 2018-04-26 DIAGNOSIS — M79675 Pain in left toe(s): Secondary | ICD-10-CM

## 2018-04-26 DIAGNOSIS — L97522 Non-pressure chronic ulcer of other part of left foot with fat layer exposed: Secondary | ICD-10-CM

## 2018-04-26 DIAGNOSIS — L97529 Non-pressure chronic ulcer of other part of left foot with unspecified severity: Secondary | ICD-10-CM | POA: Diagnosis not present

## 2018-04-26 DIAGNOSIS — E0843 Diabetes mellitus due to underlying condition with diabetic autonomic (poly)neuropathy: Secondary | ICD-10-CM | POA: Diagnosis not present

## 2018-04-26 DIAGNOSIS — M21172 Varus deformity, not elsewhere classified, left ankle: Secondary | ICD-10-CM

## 2018-05-03 NOTE — Progress Notes (Signed)
   Subjective:  73 year old female with PMHx of DM presenting today for follow-up evaluation regarding an ulceration of the lateral aspect of the left foot secondary to diabetes mellitus. She states she is doing better and the wound has improved. She reports some associated swelling of the left great toe that began one week ago but believes it is from adjusting the AFO brace she has been using. She denies any significant pain or modifying factors. Patient is here for further evaluation and treatment.   Past Medical History:  Diagnosis Date  . Anxiety   . COPD (chronic obstructive pulmonary disease) (HCC)   . Diabetes (HCC)   . Hemorrhoid   . HLD (hyperlipidemia)   . HTN (hypertension)   . Stroke [facility]) 2008   left weakness     Objective/Physical Exam General: The patient is alert and oriented x3 in no acute distress.  Dermatology:  Wound noted to the lateral left foot has healed. Complete re-epithelialization has occurred. No drainage noted.   Vascular: Palpable pedal pulses bilaterally. No edema or erythema noted. Capillary refill within normal limits.  Neurological: Epicritic and protective threshold diminished bilaterally.   Musculoskeletal Exam: Range of motion within normal limits to all pedal and ankle joints bilateral. Muscle strength 5/5 in all groups bilateral.  There is a varus deformity noted to the left lower extremity secondary to the fifth ray amputation the patient had prior, with likely compromise to her peroneal tendons and loss of eversion and muscle balance.  This seems to be contributing to her ulceration.  Radiographic Exam:  Normal osseous mineralization. Joint spaces preserved. No fracture/dislocation/boney destruction.    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 X-Rays reviewed.  #2 Light debridement of the ulceration of the left foot performed using a tissue nipper.  #3 the wound was cleansed and dry sterile dressing applied. #4 Continue using AFO brace and DM shoes.  #5 Return to clinic in 3 months for routine care.    Felecia Shelling, DPM [facility]  Dr. Felecia Shelling, DPM    [address]                                        [place], [place] [address]                Office [phone]  Fax [phone]

## 2018-06-01 ENCOUNTER — Ambulatory Visit: Payer: Medicare HMO | Attending: Obstetrics and Gynecology | Admitting: Physical Therapy

## 2018-06-01 ENCOUNTER — Ambulatory Visit: Payer: Medicare HMO

## 2018-06-01 ENCOUNTER — Other Ambulatory Visit: Payer: Self-pay

## 2018-06-01 DIAGNOSIS — R262 Difficulty in walking, not elsewhere classified: Secondary | ICD-10-CM | POA: Insufficient documentation

## 2018-06-01 DIAGNOSIS — M6281 Muscle weakness (generalized): Secondary | ICD-10-CM

## 2018-06-01 NOTE — Therapy (Signed)
[place] MAIN [facility] SERVICES [address] [place], [place], [address] Phone: [phone]   Fax:  [phone]  Physical Therapy Evaluation  Patient Details  Name: Molly [name] MRN: [id number] Date of Birth: Jan 22, 1946 No data recorded  Encounter Date: 06/01/2018  PT End of Session - 06/01/18 1441    Visit Number  1    Number of Visits  1    Date for PT Re-Evaluation  06/01/18    PT Start Time  0145    PT Stop Time  0315    PT Time Calculation (min)  90 min    Equipment Utilized During Treatment  Gait belt    Activity Tolerance  Patient tolerated treatment well    Behavior During Therapy  WFL for tasks assessed/performed       Past Medical History:  Diagnosis Date  . Anxiety   . COPD (chronic obstructive pulmonary disease) ([place])   . Diabetes ([place])   . Hemorrhoid   . HLD (hyperlipidemia)   . HTN (hypertension)   . Stroke [facility]) 2008   left weakness    Past Surgical History:  Procedure Laterality Date  . AMPUTATION Left 05/22/2014   Procedure: AMPUTATION RAY LEFT FOURTH AND FIFTH ;  Surgeon: Wylene Simmer, MD;  Location: [facility];  Service: Orthopedics;  Laterality: Left;  . CATARACT EXTRACTION    . COLONOSCOPY  2004  . COLONOSCOPY WITH PROPOFOL N/A 04/29/2016   Procedure: COLONOSCOPY WITH PROPOFOL;  Surgeon: Jonathon Bellows, MD;  Location: [facility] ENDOSCOPY;  Service: Gastroenterology;  Laterality: N/A;  . ESOPHAGOGASTRODUODENOSCOPY (EGD) WITH PROPOFOL N/A 04/27/2016   Procedure: ESOPHAGOGASTRODUODENOSCOPY (EGD) WITH PROPOFOL;  Surgeon: Lucilla Lame, MD;  Location: [facility] ENDOSCOPY;  Service: Endoscopy;  Laterality: N/A;    There were no vitals filed for this visit.   Subjective Assessment - 06/01/18 1358    Subjective  Patient is here to get a powerchair    Patient is accompained by:  Family member    Currently in Pain?  Yes    Pain Score  3     Pain Location  Knee    Pain Orientation  Right;Left    Pain Descriptors / Indicators  Aching     Pain Type  Chronic pain    Multiple Pain Sites  No       PATIENT INFORMATION: This Evaluation form will serve as the LMN for the following suppliers:  Supplier: [employer] Contact Person: Felton Clinton  Phone:[phone]   Reason for Referral:power chair Patient/caregiver Goals: Patient was seen for face-to-face evaluation for new power wheelchair.  Also present was  Felton Clinton to discuss recommendations and wheelchair options.  Further paperwork was completed and sent to vendor.  Patient appears to qualify for power mobility device at this time per objective findings.   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 Primary Diagnosis Onset: 2008 '[x]' Progressive Disease Relevant Past and Future Surgeries: Height: 5 , 6" Weight:# 027 Explain and recent changes or trends in weight: no changes  Relevant History including falls: Patient stumbles and has near falls. She walks short distances with hemi walker but is unsteady.       HOME ENVIRONMENT: '[x]' House  '[]' Condo/town home  '[]' Apartment  '[]' Assisted Living    '[]' Lives  Alone '[x]'  Lives with Others                                                    Hours with caregiver: 1- 2 times / week 1 hour  '[x]' Home is accessible to patient            Stairs  '[x]' Yes '[]'  No     Ramp '[x]' Yes '[]' No Comments:     COMMUNITY ADL: TRANSPORTATION: '[]' Car    '[x]' Van    '[]' Public Transportation    '[]' Adapted w/c Lift   '[]' Ambulance   '[]' Other:       '[]' Sits in wheelchair during transport  Employment/School:     Specific requirements pertaining to mobility                                                     Other:                                       FUNCTIONAL/SENSORY PROCESSING SKILLS:  Handedness:   '[x]' Right     '[]' Left    '[]' NA  Comments:                                  Functional Processing Skills for Wheeled Mobility '[x]' Processing Skills are adequate for safe wheelchair operation  Areas of concern than may interfere with safe operation of wheelchair Description of problem   '[]'  Attention to environment     '[]' Judgment     '[]'  Hearing  '[]'  Vision or visual processing    '[]' Motor Planning  '[]'  Fluctuations in Behavior                                                   VERBAL COMMUNICATION: '[x]' WFL receptive '[x]'  WFL expressive '[x]' Understandable  '[]' Difficult to understand  '[]' non-communicative '[]'  Uses an augmented communication device    CURRENT SEATING / MOBILITY: Current Mobility Base:   '[]' None  '[]' Dependent  '[x]' Manual  '[]' Scooter  '[]' Power   Type of Control:                       Manufacturer:     Drive                   Size:    unknown                     Age:    73 years                       Current Condition of Mobility Base:  Falling apart, arm rests broken, foot rests are irregular,  Seat sling worn out  Current Wheelchair components:   NA                                                                                                                                Describe posture in present seating system:     Unable to determine                                                                       SENSATION and SKIN ISSUES: Sensation '[x]' Intact '[]' Impaired '[]' Absent   Level of sensation:                           Pressure Relief: Able to perform effective pressure relief :   '[]' Yes  '[x]'  No Method:                                                                          If not, Why?:  Left UE flaccid and LLE weakness                                                                        Skin Issues/Skin Integrity Current Skin Issues   '[]' Yes '[x]' No  '[]' Intact '[x]'  Red area '[]'  Open Area  '[]' Scar  Tissue '[x]' At risk from prolonged sitting  Where                              History of Skin Issues   '[x]' Yes '[]' No  Where  Buttocks                                      When                                             At the [facility]  Hx of skin flap surgeries '[]' Yes '[x]' No  Where  When                                                  Limited sitting tolerance '[x]' Yes '[]' No Hours spent sitting in wheelchair daily:  Limited to 30 minute sessions before needing to get back to a comfortable seated position.                     Manual chair is not meeting her postural needs                                   Complaint of Pain:  Please describe:  Patient has knee pain that is increased's with weightbearing.                                                                                                           Swelling/Edema:  Yes bilaterally                                                                                                                                                ADL STATUS (in reference to wheelchair use):  Indep Assist Unable Indep with Equip Not assessed Comments  Dressing                 x                                                        Eating     x  Toileting                x                                                                                                               Bathing             x                                                                                                                         Grooming/ Hygiene               x                                                                                                               Meal Prep                 x                                                                                                         IADLS                x  Bowel Management: '[x]' Continent  '[]' Incontinent  '[x]' Accidents Comments:                                                  Bladder Management: '[x]' Continent  '[]' Incontinent  '[x]' Accidents Comments:                                              WHEELCHAIR SKILLS: Manual w/c Propulsion: '[]' UE or LE strength and endurance sufficient to participate in ADLs using manual wheelchair Arm :  '[]' left '[]' right  '[]' Both                                   Foot:   '[]' left '[]' right  '[]' Both  Distance:   Operate Scooter: '[]'  Strength, hand grip, balance and transfer appropriate for use '[]' Living environment is accessible for use of scooter  Operate Power w/c:  '[x]'  Std. Joystick   '[]'  Alternative Controls Indep '[x]'  Assist '[]'  Dependent/ Unable '[]'  N/A '[]'  '[x]' Safe          '[x]'  Functional      Distance:                Bed confined without wheelchair '[]'  Yes '[]'  No   STRENGTH/RANGE OF MOTION:  Range of Motion Strength  Shoulder                   WNL     R, L 80 deg shoulder flex/abd                                 RUE 4/5   , LUE flaccid                                                 Elbow                      WNL  R, L                                      RUE 4/5   , LUE flaccid                                                     Wrist/Hand                                                                 WNL R, L  RUE 4/5   , LUE flaccid       Hip                                                               WNL R, L                                                                RUE 4/5   , LLE  Hip flex 2/5, knee flex/ext -3/5, ankle flaccid    Knee                                                             WNL R, L  R, L   RUE 4/5   , L knee -3/5      Ankle   WNL R, L                RUE 4/5   , LUE flaccid                                                           MOBILITY/BALANCE:  '[x]'  Patient is totally dependent for mobility                                                                                               Balance Transfers Ambulation  Sitting Balance: Standing Balance: '[]'  Independent '[]'  Independent/Modified Independent  '[]'  WFL     '[]'  WFL '[x]'  Supervision '[x]'  Supervision  '[x]'  Uses UE for balance  '[]'  Supervision '[]'  Min Assist '[]'  Ambulates with Assist                           '[]'  Min Assist '[]'  Min assist '[]'  Mod Assist '[x]'  Ambulates with Device:  '[]'  RW   '[]'  StW   '[]'  Cane   '[x]'   Hemi walker              '[]'  Mod Assist '[x]'  Mod assist '[]'  Max assist   '[]'  Max Assist '[]'  Max assist '[]'  Dependent '[x]'  Indep. Short Distance Only 10 feet  '[]'  Unable '[]'  Unable '[]'  Lift / Sling Required Distance (in feet)                  10           '[]'   Sliding board '[]'  Unable to Ambulate: (Explain:  Cardio Status:  '[x]' Intact  '[]'  Impaired   '[]'  NA                              Respiratory Status:  '[x]' Intact   '[]' Impaired   '[]' NA                                     Orthotics/Prosthetics:   L short leg brace                                                                       Comments (Address manual vs power w/c vs scooter):  Patient is not safe with ambulation and has had falls and near falls due to unsteadiness                                                            Anterior / Posterior Obliquity Rotation-Pelvis  PELVIS    '[]' Neutral  '[x]'  Posterior  '[]'  Anterior     '[]' WFL  '[]' Right Elevated  '[x]' Left Elevated   '[]' WFL  '[x]' Right Anterior '[]'   Left Anterior    '[]'  Fixed '[x]'  Partly Flexible '[]'  Flexible  '[]'  Other  '[]'  Fixed  '[x]'  Partly Flexible  '[]'  Flexible '[]'  Other  '[]'  Fixed  '[x]'  Partly Flexible  '[]'  Flexible '[]'  Other  TRUNK '[]' WFL '[x]' Thoracic Kyphosis '[]' Lumbar Lordosis   '[x]'  WFL '[]' Convex Right '[]' Convex Left   '[]' c-curve '[]' s-curve '[]' multiple  '[x]'  Neutral '[]'  Left-anterior '[]'  Right-anterior    '[]'  Fixed '[]'  Flexible '[x]'  Partly  Flexible       Other  '[]'  Fixed '[]'  Flexible '[]'  Partly Flexible '[]'  Other  '[]'  Fixed           '[]'  Flexible '[]'  Partly Flexible '[]'  Other   Position Windswept   HIPS  '[]'  Neutral '[x]'  Abduct '[]'  ADduct '[x]'  Neutral '[]'  Right '[]'  Left       '[]'  Fixed  '[x]'  Partly Flexible             '[]'  Dislocated '[]'  Flexible '[]'  Subluxed    '[]'  Fixed '[]'  Partly Flexible  '[]'  Flexible '[]'  Other              Foot Positioning Knee Positioning   Knees and  Feet  '[]'  WFL '[]' Left '[x]' Right '[]'  WFL '[]' Left '[]' Right   KNEES ROM concerns: ROM concerns:   & Dorsi-Flexed                    '[]' Lt '[]' Rt                                  FEET Plantar Flexed                  '[]' Lt '[]' Rt     Inversion                    '[]'   Lt '[]' Rt     Eversion                    '[]' Lt '[x]' Rt    HEAD '[x]'  Functional '[x]'  Good Head Control   & '[]'  Flexed         '[]'  Extended '[]'  Adequate Head Control   NECK '[]'  Rotated  Lt  '[]'  Lat Flexed Lt '[]'  Rotated  Rt '[]'  Lat Flexed Rt '[]'  Limited Head Control    '[]'  Cervical Hyperextension '[]'  Absent  Head Control    SHOULDERS ELBOWS WRIST& HAND         Left     Right    Left left    Right  U/E '[]' Functional  Left            '[]' Functional  Right                                 '[x]' Fisting    left         '[]' Fisting     '[]' elevated Left '[x]' depressed  Left '[]' elevated Right '[]' depressed  Right      '[]' protracted Left '[]' retracted Left '[]' protracted Right '[]' retracted Right '[x]' subluxed  Left              '[]' subluxed  Right         Goals for Wheelchair Mobility  '[x]'  Independence with mobility in the home with motor related ADLs (MRADLs)  '[x]'  Independence with MRADLs in the community '[]'  Provide dependent mobility  '[]'  Provide recline     '[x]' Provide tilt   Goals for Seating system '[x]'  Optimize pressure distribution '[x]'  Provide support needed to facilitate function or safety '[x]'  Provide corrective forces to assist with maintaining or improving posture '[]'  Accommodate client's posture: current seated postures and  positions are not flexible or will not tolerate corrective forces '[x]'  Client to be independent with relieving pressure in the wheelchair '[x]' Enhance physiological function such as breathing, swallowing, digestion  Simulation ideas/Equipment trials:                                                                                                State why other equipment was unsuccessful:  Manual wc is not able to provide support for posture and she is unable to propel     wc due to flaccid LUE and weakness in LLE                                                                           MOBILITY BASE RECOMMENDATIONS and JUSTIFICATION: MOBILITY COMPONENT JUSTIFICATION  Manufacturer:           Model:              Size: Width   18  Seat Depth   18          '[x]' provide transport from point A to B '[x]' promote Indep mobility  '[x]' is not a safe, functional ambulator '[x]' walker or cane inadequate '[x]' non-standard width/depth necessary to accommodate anatomical measurement '[]'                             '[]' Manual Mobility Base '[]' non-functional ambulator    '[]' Scooter/POV  '[]' can safely operate  '[]' can safely transfer   '[]' has adequate trunk stability  '[]' cannot functionally propel manual w/c  '[x]' Power Mobility Base  '[x]' non-ambulatory  '[x]' cannot functionally propel manual wheelchair  '[x]'  cannot functionally and safely operate scooter/POV '[x]' can safely operate and willing to  '[]' Stroller Base '[]' infant/child  '[]' unable to propel manual wheelchair '[]' allows for growth '[]' non-functional ambulator '[]' non-functional UE '[]' Indep mobility is not a goal at this time  '[x]' Tilt  '[]' Forward                   '[x]' Backward                  '[x]' Powered tilt              '[]' Manual tilt  '[x]' change position against gravitational force on head and shoulders  '[x]' change position for pressure relief/cannot weight shift '[x]' transfers  '[]' management of tone '[x]' rest periods '[x]' control edema '[x]' facilitate postural control  '[x]'     Hx  of skin breakdown                                '[]' Recline  '[]' Power recline on power base '[]' Manual recline on manual base  '[]' accommodate femur to back angle  '[]' bring to full recline for ADL care  '[]' change position for pressure relief/cannot weight shift '[]' rest periods '[]' repositioning for transfers or clothing/diaper /catheter changes '[]' head positioning  '[]' Lighter weight required '[]' self- propulsion  '[]' lifting '[]'                                                 '[]' Heavy Duty required '[]' user weight greater than 250# '[]' extreme tone/ over active movement '[]' broken frame on previous chair '[]'                                     '[]'  Back  '[]'  Angle Adjustable '[]'  Custom molded                           '[]' postural control '[]' control of tone/spasticity '[]' accommodation of range of motion '[]' UE functional control '[]' accommodation for seating system '[]'                                          '[]' provide lateral trunk support '[]' accommodate deformity '[]' provide posterior trunk support '[]' provide lumbar/sacral support '[]' support trunk in midline '[]' Pressure relief over spinal processes  '[x]'  Seat Cushion                       '[x]' impaired sensation  '[]' decubitus ulcers present '[x]' history of pressure ulceration '[x]' prevent pelvic extension '[]' low maintenance  '[x]' stabilize pelvis  '[x]' accommodate obliquity '[]' accommodate multiple deformity '[x]' neutralize  lower extremity position '[x]' increase pressure distribution '[]'                                           '[]'  Pelvic/thigh support  '[x]'  Lateral thigh guide '[]'  Distal medial pad  '[]'  Distal lateral pad '[]'  pelvis in neutral '[x]' accommodate pelvis '[x]'  position upper legs '[]'  alignment '[x]'  accommodate ROM '[]'  decrease adduction '[]' accommodate tone '[x]' removable for transfers '[x]' decrease abduction  '[]'  Lateral trunk Supports '[]'  Lt     '[]'  Rt '[]' decrease lateral trunk leaning '[]' control tone '[]' contour for increased contact '[]' safety  '[]' accommodate asymmetry '[]'                                                  '[]'  Mounting hardware  '[]' lateral trunk supports  '[]' back   '[]' seat '[x]' headrest      '[x]'  thigh support '[]' fixed   '[]' swing away '[]' attach seat platform/cushion to w/c frame '[]' attach back cushion to w/c frame '[x]' mount postural supports '[x]' mount headrest  '[]' swing medial thigh support away '[]' swing lateral supports away for transfers  '[]'                                                     Armrests  '[]' fixed '[x]' adjustable height '[]' removable   '[]' swing away  '[x]' flip back   '[]' reclining '[x]' full length pads '[]' desk    '[]' pads tubular  '[x]' provide support with elbow at 90   '[]' provide support for w/c tray '[x]' change of height/angles for variable activities '[x]' remove for transfers '[x]' allow to come closer to table top '[x]' remove for access to tables '[]'                                               Hangers/ Leg rests  '[]' 60 '[]' 70 '[]' 90 '[]' elevating '[]' heavy duty  '[]' articulating '[]' fixed '[]' lift off '[]' swing away     '[]' power '[]' provide LE support  '[]' accommodate to hamstring tightness '[]' elevate legs during recline   '[]' provide change in position for Legs '[]' Maintain placement of feet on footplate '[]' durability '[]' enable transfers '[]' decrease edema '[]' Accommodate lower leg length '[]'                                         Foot support Footplate    '[]' Lt  '[]'  Rt  '[x]'  Center mount '[x]' flip up                            '[x]' depth/angle adjustable '[]' Amputee adapter    '[]'  Lt     '[]'  Rt '[x]' provide foot support '[x]' accommodate to ankle ROM '[x]' transfers '[]' Provide support for residual extremity '[]'  allow foot to go under wheelchair base '[]'  decrease tone  '[]'                                                 '[]'   Ankle strap/heel loops '[]' support foot on foot support '[]' decrease extraneous movement '[]' provide input to heel  '[]' protect foot  Tires: '[]' pneumatic  '[x]' flat free inserts  '[]' solid  '[x]' decrease maintenance  '[x]' prevent frequent flats '[]' increase shock absorbency '[]' decrease pain from road  shock '[]' decrease spasms from road shock '[]'                                              '[x]'  Headrest  '[x]' provide posterior head support '[]' provide posterior neck support '[]' provide lateral head support '[]' provide anterior head support '[x]' support during tilt and recline '[]' improve feeding   '[]' improve respiration '[]' placement of switches '[x]' safety  '[]' accommodate ROM  '[]' accommodate tone '[]' improve visual orientation  '[]'  Anterior chest strap '[]'  Vest '[]'  Shoulder retractors  '[]' decrease forward movement of shoulder '[]' accommodation of TLSO '[]' decrease forward movement of trunk '[]' decrease shoulder elevation '[]' added abdominal support '[]' alignment '[]' assistance with shoulder control  '[]'                                               Pelvic Positioner '[x]' Belt '[]' SubASIS bar '[]' Dual Pull '[]' stabilize tone '[x]' decrease falling out of chair/ **will not Decrease potential for sliding due to pelvic tilting '[]' prevent excessive rotation '[]' pad for protection over boney prominence '[]' prominence comfort '[]' special pull angle to control rotation '[]'                                                  Upper ExtremitySupport  '[x]' L   '[]'  R '[x]' Arm trough   '[x]' hand support '[]'  tray       '[]' full tray '[]' swivel mount '[]' decrease edema      '[x]' decrease subluxation   '[]' control tone   '[]' placement for AAC/Computer/EADL '[x]' decrease gravitational pull on shoulders '[]' provide midline positioning '[]' provide support to increase UE function '[x]' provide hand support in natural position '[]' provide work surface   POWER WHEELCHAIR CONTROLS  '[x]' Proportional  '[]' Non-Proportional Type       Joy stick                               '[]' Left  '[x]' Right '[x]' provides access for controlling wheelchair   '[]' lacks motor control to operate proportional drive control '[]' unable to understand proportional controls  Actuator Control Module  '[x]' Single  '[]' Multiple   '[x]' Allow the client to operate the power seat function(s) through the joystick control    '[]' Safety Reset Switches '[]' Used to change modes and stop the wheelchair when driving in latch mode    '[x]' Upgraded Electronics   '[x]' programming for accurate control '[]' progressive Disease/changing condition '[]' non-proportional drive control needed '[x]' Needed in order to operate power seat functions through joystick control   '[]' Display box '[]' Allows user to see in which mode and drive the wheelchair is set  '[]' necessary for alternate controls    '[]' Digital interface electronics '[]' Allows w/c to operate when using alternative drive controls  '[]' ASL Head Array '[]' Allows client to operate wheelchair  through switches placed in tri-panel headrest  '[]' Sip and puff with tubing kit '[]' needed to operate sip and puff drive controls  '[]' Upgraded tracking electronics '[]' increase safety when driving '[]' correct tracking when on uneven surfaces  [  x]Mount for switches or joystick '[]' Attaches switches to w/c  '[x]' Swing away for access or transfers '[]' midline for optimal placement '[]' provides for consistent access  '[]' Attendant controlled joystick plus mount '[]' safety '[]' long distance driving '[]' operation of seat functions '[]' compliance with transportation regulations '[]'                                             Rear wheel placement/Axle adjustability '[]' None '[]' semi adjustable '[]' fully adjustable  '[]' improved UE access to wheels '[]' improved stability '[]' changing angle in space for improvement of postural stability '[]' 1-arm drive access '[]' amputee pad placement '[]'                                Wheel rims/ hand rims  '[]' metal   '[]' plastic coated '[]' oblique projections           '[]' vertical projections '[]' Provide ability to propel manual wheelchair  '[]'  Increase self-propulsion with hand weakness/decreased grasp  Push handles '[]' extended   '[]' angle adjustable              '[]' standard '[]' caregiver access '[]' caregiver assist '[]' allows "hooking" to enable increased ability to perform ADLs or maintain balance  One armed device   '[]' Lt   '[]' Rt  '[]' enable propulsion of manual wheelchair with one arm   '[]'                                            Brake/wheel lock extension '[]'  Lt   '[]'  Rt '[]' increase indep in applying wheel locks   '[]' Side guards '[]' prevent clothing getting caught in wheel or becoming soiled '[]'  prevent skin tears/abrasions  Battery:   nf22 x 2                                         '[x]' to power wheelchair                                                         Other:          Calf pads                            Support legs while in tilt position                                                                                 The above equipment has a life- long use expectancy. Growth and changes in medical and/or functional conditions would be the exceptions. This is to certify that the therapist has no financial relationship with durable medical provider or manufacturer. The therapist will not receive remuneration of any kind for  the equipment recommended in this evaluation.   Patient has mobility limitation that significantly impairs safe, timely participation in one or more mobility related ADL's. (bathing, toileting, feeding, dressing, grooming, moving from room to room)  '[x]'  Yes '[]'  No  Will mobility device sufficiently improve ability to participate and/or be aided in participation of MRADL's?      '[x]'  Yes '[]'  No  Can limitation be compensated for with use of a cane or walker?                                    '[]'  Yes '[x]'  No  Does patient or caregiver demonstrate ability/potential ability & willingness to safely use the mobility device?    '[x]'  Yes '[]'  No  Does patient's home environment support use of recommended mobility device?            '[x]'  Yes '[]'  No  Does patient have sufficient upper extremity function necessary to functionally propel a manual wheelchair?     '[]'  Yes '[x]'  No  Does patient have sufficient strength and trunk stability to safely operate a POV (scooter)?                                  '[]'  Yes '[x]'   No  Does patient need additional features/benefits provided by a power wheelchair for MRADL's in the home?        '[x]'  Yes '[]'  No  Does the patient demonstrate the ability to safely use a power wheelchair?                   '[x]'  Yes '[]'  No     Physician's Name Printed:  Josephine Cables                                                       Physician's Signature:  Date:     This is to certify that I, the above signed therapist have the following affiliations: '[]'  This DME provider '[]'  Manufacturer of recommended equipment '[]'  Patient's long term care facility '[x]'  None of the above  Therapist Name/Signature:        Arelia Sneddon PT , DPT                                    Date:06/01/18               Objective measurements completed on examination: See above findings.                   PT Long Term Goals - 06/01/18 1445      PT LONG TERM GOAL #1   Title  Pt and caregivers will understand PT recommendation and appropriate/safe use for wheelchair and seating for home use.    Time  1    Period  Days    Status  Achieved    Target Date  06/01/18             Plan - 06/01/18 1441    Clinical Impression Statement  Patient is evaluated for a powerchair.    Personal Factors and Comorbidities  Age;Comorbidity 3+    Comorbidities  decreased sensation, poor mobiity, decreased sitting and standing balance, CVA, neuropathy, DM    Examination-Activity Limitations  Bathing;Bed Mobility;Dressing;Continence;Hygiene/Grooming    Examination-Participation Restrictions  Church;Yard Work;Cleaning;Laundry;Community Activity;Meal Prep    Stability/Clinical Decision Making  Evolving/Moderate complexity    Clinical Decision Making  Moderate    Rehab Potential  Good    PT Frequency  One time visit    PT Next Visit Plan  eval only    Consulted and Agree with Plan of Care  Family member/caregiver;Patient       Patient will benefit from skilled therapeutic intervention  in order to improve the following deficits and impairments:     Visit Diagnosis: Muscle weakness (generalized)  Difficulty in walking, not elsewhere classified     Problem List Patient Active Problem List   Diagnosis Date Noted  . Pressure injury of skin 04/28/2016  . Acute GI bleeding 04/27/2016  . Anemia, posthemorrhagic, acute   . Blood in stool   . Gastritis without bleeding   . Involuntary movements   . Acute respiratory failure ([place]) 03/05/2016  . Acute encephalopathy 02/06/2016  . Acute cerebrovascular accident (CVA) ([place])   . Altered mental status   . 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 Cellulitis of left lower extremity 11/19/2014  . COPD (chronic obstructive pulmonary disease) ([place]) 11/19/2014  . HTN (hypertension) 11/19/2014  . HLD (hyperlipidemia) 11/19/2014  . Diabetes mellitus ([place]) 05/21/2014  . Chronic anemia 05/21/2014    Alanson Puls, PT DPT 06/01/2018, 3:04 PM  [place] [facility] SERVICES [address] [place], [place], [address] Phone: [phone]   Fax:  [phone]  Name: HAYLIN CAMILLI MRN: [id number] Date of Birth: 11-Aug-1945

## 2018-07-26 ENCOUNTER — Ambulatory Visit: Payer: Medicare HMO | Admitting: Podiatry

## 2019-09-21 ENCOUNTER — Emergency Department: Payer: Medicare HMO

## 2019-09-21 ENCOUNTER — Other Ambulatory Visit: Payer: Self-pay

## 2019-09-21 ENCOUNTER — Emergency Department
Admission: EM | Admit: 2019-09-21 | Discharge: 2019-09-22 | Disposition: A | Discharge status: Home / Self Care | Payer: Medicare HMO | Attending: Emergency Medicine | Admitting: Emergency Medicine

## 2019-09-21 DIAGNOSIS — E119 Type 2 diabetes mellitus without complications: Secondary | ICD-10-CM | POA: Diagnosis not present

## 2019-09-21 DIAGNOSIS — I10 Essential (primary) hypertension: Secondary | ICD-10-CM | POA: Diagnosis not present

## 2019-09-21 DIAGNOSIS — Y939 Activity, unspecified: Secondary | ICD-10-CM | POA: Diagnosis not present

## 2019-09-21 DIAGNOSIS — Z794 Long term (current) use of insulin: Secondary | ICD-10-CM | POA: Diagnosis not present

## 2019-09-21 DIAGNOSIS — Z7982 Long term (current) use of aspirin: Secondary | ICD-10-CM | POA: Diagnosis not present

## 2019-09-21 DIAGNOSIS — Z87891 Personal history of nicotine dependence: Secondary | ICD-10-CM | POA: Insufficient documentation

## 2019-09-21 DIAGNOSIS — Z79899 Other long term (current) drug therapy: Secondary | ICD-10-CM | POA: Insufficient documentation

## 2019-09-21 DIAGNOSIS — Z0489 Encounter for examination and observation for other specified reasons: Secondary | ICD-10-CM | POA: Diagnosis present

## 2019-09-21 DIAGNOSIS — J449 Chronic obstructive pulmonary disease, unspecified: Secondary | ICD-10-CM | POA: Diagnosis not present

## 2019-09-21 DIAGNOSIS — S022XXA Fracture of nasal bones, initial encounter for closed fracture: Secondary | ICD-10-CM | POA: Diagnosis not present

## 2019-09-21 DIAGNOSIS — Y999 Unspecified external cause status: Secondary | ICD-10-CM | POA: Insufficient documentation

## 2019-09-21 DIAGNOSIS — W1811XA Fall from or off toilet without subsequent striking against object, initial encounter: Secondary | ICD-10-CM | POA: Diagnosis not present

## 2019-09-21 DIAGNOSIS — Z7951 Long term (current) use of inhaled steroids: Secondary | ICD-10-CM | POA: Diagnosis not present

## 2019-09-21 DIAGNOSIS — Y92002 Bathroom of unspecified non-institutional (private) residence single-family (private) house as the place of occurrence of the external cause: Secondary | ICD-10-CM | POA: Diagnosis not present

## 2019-09-21 DIAGNOSIS — R2689 Other abnormalities of gait and mobility: Secondary | ICD-10-CM | POA: Diagnosis not present

## 2019-09-21 DIAGNOSIS — R262 Difficulty in walking, not elsewhere classified: Secondary | ICD-10-CM

## 2019-09-21 MED ORDER — OXYCODONE-ACETAMINOPHEN 5-325 MG PO TABS
1.0000 | ORAL_TABLET | Freq: Once | ORAL | Status: AC
  Administered 2019-09-21: 1 via ORAL
  Filled 2019-09-21: qty 1

## 2019-09-21 NOTE — TOC Initial Note (Signed)
Transition of Care [facility]) - Initial/Assessment Note    Patient Details  Name: Molly [name] MRN: [id number] Date of Birth: 30-Jul-1945  Transition of [facility] CM/SW Contact:    [place] Cellar, RN Phone Number: 09/21/2019, 10:05 AM  Clinical Narrative:                 Spoke to patient and spouse at bedside. Spouse states he is concerned about being able to lift patient at home. Patient had previous stay at [facility] for rehab and had a positive experience. Patient uses walker at baseline and spouse drives to and from appointments. 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 Has speciality shoe for left foot. Will wait for PT recommendations for discharge plan.    Expected Discharge Plan: Home w Home Health Services Barriers to Discharge: Continued Medical Work up   Patient Goals and CMS Choice Patient states their goals for this hospitalization and ongoing recovery are:: Return home with spouse      Expected Discharge Plan and Services Expected Discharge Plan: Home w Home Health Services       Living arrangements for the past 2 months: Single Family Home                                      Prior Living Arrangements/Services Living arrangements for the past 2 months: Single Family Home Lives with:: Spouse Patient language and need for interpreter reviewed:: Yes Do you feel safe going back to the place where you live?: Yes      Need for Family Participation in Patient Care: Yes (Comment) Care giver support system in place?: Yes (comment) Current home services: DME (walker, wheelchair, [facility]) Criminal Activity/Legal Involvement Pertinent to Current Situation/Hospitalization: No - Comment as needed  Activities of Daily Living      Permission Sought/Granted Permission sought to share information with : Case Manager Permission granted to share information with : Yes, Verbal Permission Granted  Share Information with  NAME: TOC Department           Emotional Assessment Appearance:: Disheveled, Appears stated age Attitude/Demeanor/Rapport: Engaged Affect (typically observed): Accepting Orientation: : Oriented to Self, Oriented to Place, Oriented to  Time, Oriented to Situation Alcohol / Substance Use: Not Applicable Psych Involvement: No (comment)  Admission diagnosis:  [facility] Patient Active Problem List   Diagnosis Date Noted  . Pressure injury of skin 04/28/2016  . Acute GI bleeding 04/27/2016  . Anemia, posthemorrhagic, acute   . Blood in stool   . Gastritis without bleeding   . Involuntary movements   . Acute respiratory failure ([facility]) 03/05/2016  . Acute encephalopathy 02/06/2016  . Acute cerebrovascular accident (CVA) ([facility])   . Altered mental status   . 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 Cellulitis of left lower extremity 11/19/2014  . COPD (chronic obstructive pulmonary disease) ([facility]) 11/19/2014  . HTN (hypertension) 11/19/2014  . HLD (hyperlipidemia) 11/19/2014  . Diabetes mellitus ([facility]) 05/21/2014  . Chronic anemia 05/21/2014   PCP:  Ethelda Chick, MD Pharmacy:   [facility] - [place], [place] - [address] [place] [address] Phone: [phone] Fax: [phone]  [facility] [address], [place] - [address] [place] [address] Phone: [phone] Fax: [phone]     Social Determinants of Health (SDOH) Interventions  Readmission Risk Interventions No flowsheet data found.

## 2019-09-21 NOTE — ED Notes (Addendum)
Pt repositioned and assisted with snack and drink.

## 2019-09-21 NOTE — ED Notes (Signed)
Pt with slight residual left sided weakness from prior stroke (pt reports approx 2009)

## 2019-09-21 NOTE — ED Provider Notes (Signed)
[facility] Emergency Department Provider Note  ____________________________________________   First MD Initiated Contact with Patient 09/21/19 [phone]     (approximate)  I have reviewed the triage vital signs and the nursing notes.   HISTORY  Chief Complaint Fall    HPI Molly BromeBrenda P Lanae BoastGarner is a 74 y.o. female with below list of previous medical conditions presents to the emergency department via EMS after accidental fall while using the commode.  Patient states that the commode toppled over resulting in her fall.  Patient does admit to head injury with a nosebleed as well as right shoulder and right knee pain.  Patient denies any loss of consciousness.        Past Medical History:  Diagnosis Date  . Anxiety   . COPD (chronic obstructive pulmonary disease) (HCC)   . Diabetes (HCC)   . Hemorrhoid   . HLD (hyperlipidemia)   . HTN (hypertension)   . Stroke [facility](HCC) 2008   left weakness    Patient Active Problem List   Diagnosis Date Noted  . Pressure injury of skin 04/28/2016  . Acute GI bleeding 04/27/2016  . Anemia, posthemorrhagic, acute   . Blood in stool   . Gastritis without bleeding   . Involuntary movements   . Acute respiratory failure (HCC) 03/05/2016  . Acute encephalopathy 02/06/2016  . Acute cerebrovascular accident (CVA) (HCC)   . Altered mental status   . 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 Cellulitis of left lower extremity 11/19/2014  . COPD (chronic obstructive pulmonary disease) (HCC) 11/19/2014  . HTN (hypertension) 11/19/2014  . HLD (hyperlipidemia) 11/19/2014  . Diabetes mellitus (HCC) 05/21/2014  . Chronic anemia 05/21/2014    Past Surgical History:  Procedure Laterality Date  . AMPUTATION Left 05/22/2014   Procedure: AMPUTATION RAY LEFT FOURTH AND FIFTH ;  Surgeon: Toni ArthursJohn Hewitt, MD;  Location: [facility] OR;  Service: Orthopedics;  Laterality: Left;  . CATARACT EXTRACTION    . COLONOSCOPY  2004  . COLONOSCOPY WITH PROPOFOL N/A 04/29/2016     Procedure: COLONOSCOPY WITH PROPOFOL;  Surgeon: Wyline MoodKiran Anna, MD;  Location[facility] ENDOSCOPY;  Service: Gastroenterology;  Laterality: N/A;  . ESOPHAGOGASTRODUODENOSCOPY (EGD) WITH PROPOFOL N/A 04/27/2016   Procedure: ESOPHAGOGASTRODUODENOSCOPY (EGD) WITH PROPOFOL;  Surgeon: Midge Miniumarren Wohl, MD;  Location: [facility] ENDOSCOPY;  Service: Endoscopy;  Laterality: N/A;    Prior to Admission medications   Medication Sig Start Date End Date Taking? Authorizing Provider  acetaminophen (TYLENOL) 325 MG tablet Take 650 mg by mouth every 6 (six) hours as needed.   Yes [provider]  albuterol (PROVENTIL HFA;VENTOLIN HFA) 108 (90 Base) MCG/ACT inhaler ALBUTEROL 90 MCG/ACT AERS 05/20/09  Yes [provider]  amitriptyline (ELAVIL) 75 MG tablet Take 75 mg by mouth at bedtime.    Yes [provider]  aspirin EC 81 MG tablet ASPIRIN 81 MG TABS 02/28/09  Yes [provider]  atorvastatin (LIPITOR) 40 MG tablet Take 1 tablet (40 mg total) by mouth daily at 6 PM. 02/03/16  Yes Enid BaasKalisetti, Radhika, MD  B Complex-C (SUPER B COMPLEX PO) Take 1 tablet by mouth daily.    Yes [provider]  canagliflozin (INVOKANA) 100 MG TABS tablet Take by mouth daily before breakfast.   Yes [provider]  Cholecalciferol (VITAMIN D3) 1000 UNITS CAPS Take 1,000 Units by mouth daily.   Yes [provider]  clopidogrel (PLAVIX) 75 MG tablet Take 75 mg by mouth.   Yes [provider]  diclofenac sodium (VOLTAREN) 1 %  GEL Apply 4 g topically 4 (four) times daily.   Yes [provider]  insulin aspart protamine - aspart (NOVOLOG 70/30 MIX) (70-30) 100 UNIT/ML FlexPen Inject 90 Units into the skin 2 (two) times daily.   Yes [provider]  magnesium oxide (MAG-OX) 400 MG tablet Take 400 mg by mouth 2 (two) times daily.   Yes [provider]  metFORMIN (GLUCOPHAGE-XR) 500 MG 24 hr tablet Take 500 mg by mouth 2 (two) times daily. 09/05/19  Yes [provider]  polyethylene glycol (MIRALAX / GLYCOLAX) packet Take 17 g by mouth daily.   Yes [provider]  senna-docusate (SENOKOT-S) 8.6-50 MG tablet Take 1 tablet by mouth at bedtime.   Yes [provider]  sitaGLIPtin (JANUVIA) 100 MG tablet Take 100 mg by mouth daily.   Yes [provider]  traZODone (DESYREL) 50 MG tablet  09/11/19  Yes [provider]    Allergies Patient has no known allergies.  Family History  Problem Relation Age of Onset  . Hyperlipidemia Mother   . Varicose Veins Mother   . Deep vein thrombosis Father   . Stroke Father   . Breast cancer Paternal Aunt   . Stroke Maternal Grandmother   . Stroke Paternal Grandmother     Social History Social History   Tobacco Use  . Smoking status: Former Smoker    Packs/day: 0.50    Years: 2.00    Pack years: 1.00    Types: Cigarettes    Quit date: 11/18/1968    Years since quitting: 50.8  . Smokeless tobacco: Never Used  Substance Use Topics  . Alcohol use: No    Alcohol/week: 0.0 standard drinks    Comment: seldom  . Drug use: No    Review of Systems Constitutional: No fever/chills Eyes: No visual changes. ENT: No sore throat.  Positive for nosebleed Cardiovascular: Denies chest pain. Respiratory: Denies shortness of breath. Gastrointestinal: No abdominal pain.  No nausea, no vomiting.  No diarrhea.  No constipation. Genitourinary: Negative for dysuria. Musculoskeletal: Positive for right shoulder and knee pain. Integumentary: Negative for rash. Neurological: Negative for headaches, focal weakness or numbness.   ____________________________________________   PHYSICAL EXAM:  VITAL SIGNS: ED Triage Vitals  Enc Vitals Group     BP 09/21/19 0400 (!) 153/72     Pulse Rate 09/21/19 0354 86     Resp 09/21/19 0354 20     Temp 09/21/19 0354 97.6 F (36.4 C)     Temp Source 09/21/19 0354 Oral     SpO2 09/21/19 0354 95 %     Weight 09/21/19 0356 99.8 kg (220  lb)     Height 09/21/19 0356 1.676 m ( )     Head Circumference --      Peak Flow --      Pain Score 09/21/19 0355 3     Pain Loc --      Pain Edu? --      Excl. in GC? --     Constitutional: Alert and oriented.  Eyes: Conjunctivae are normal.  Head: Atraumatic. Ears:  Healthy appearing ear canals and TMs bilaterally Nose: Scant epistaxis from the left nare. Mouth/Throat: Patient is wearing a mask. Neck: No stridor.  No meningeal signs.   Cardiovascular: Normal rate, regular rhythm. Good peripheral circulation. Grossly normal heart sounds. Respiratory: Normal respiratory effort.  No retractions. Gastrointestinal: Soft and nontender. No distention.  Musculoskeletal: Right shoulder pain with gentle palpation.  Range of motion limited  secondary to discomfort.  Right knee pain with gentle palpation as well. Neurologic:  Normal speech and language. No gross focal neurologic deficits are appreciated.  Skin:  Skin is warm, dry and intact. Psychiatric: Mood and affect are normal. Speech and behavior are normal.    EKG  ED ECG REPORT I, Hiltonia N Juventino Pavone, the attending physician, personally viewed and interpreted this ECG.   Date: 09/21/2019  EKG Time: 3:57 AM  Rate: 84  Rhythm: Normal sinus rhythm  Axis: Normal  Intervals: Normal  ST&T Change: None  ____________________________________________  RADIOLOGY I, [place] N Rangel Echeverri, personally viewed and evaluated these images (plain radiographs) as part of my medical decision making, as well as reviewing the written report by the radiologist.  ED MD interpretation: Mild soft tissue thickening at the acromioclavicular joint nonspecific on right shoulder x-ray.  CT head revealed no acute acute intracranial abnormality.  CT maxillofacial revealed bilateral nasal bone fracture is displaced to the left nasal septal fracture displaced to the right anteriorly and left posteriorly.  CT cervical spine revealed no acute  abnormality.  Official radiology report(s): DG Shoulder Right  Result Date: 09/21/2019 CLINICAL DATA:  Fall EXAM: RIGHT SHOULDER - 2+ VIEW COMPARISON:  Chest radiograph 01/07/2016 FINDINGS: The osseous structures appear diffusely demineralized which may limit detection of small or nondisplaced fractures. No acute bony abnormality. Specifically, no fracture, subluxation, or dislocation. Mild focal soft tissue thickening is present at the acromioclavicular joint, nonspecific. There are degenerative changes about the glenohumeral acromioclavicular joints with periarticular spurring including some undersurface spurring of the acromion which may predispose to subacromial impingement. No right rib fractures or other acute osseous abnormalities. IMPRESSION: 1. Mild focal soft tissue thickening at the acromioclavicular joint, nonspecific. Could correlate for point tenderness to exclude a [place] type 1 acromioclavicular joint injury. 2. No acute osseous injury or traumatic malalignment. 3. Degenerative changes about the glenohumeral and acromioclavicular joints. 4. Subacromial spurring may predispose this patient to subacromial impingement syndromes. Correlate with exam findings. Electronically Signed   By: Kreg Shropshire M.D.   On: 09/21/2019 07:00   CT Head Wo Contrast  Result Date: 09/21/2019 CLINICAL DATA:  Head injury.  Fall from a standing position. EXAM: CT HEAD WITHOUT CONTRAST CT MAXILLOFACIAL WITHOUT CONTRAST CT CERVICAL SPINE WITHOUT CONTRAST TECHNIQUE: Multidetector CT imaging of the head, cervical spine, and maxillofacial structures were performed using the standard protocol without intravenous contrast. Multiplanar CT image reconstructions of the cervical spine and maxillofacial structures were also generated. COMPARISON:  CT head 10/21/2017. CT head and cervical spine 01/26/2017 FINDINGS: CT HEAD FINDINGS Brain: Remote left frontal encephalomalacia is stable. Moderate generalized atrophy and white  matter disease is stable. No acute cortical infarct is present. No parenchymal hemorrhage is present. The ventricles are proportionate to the degree of atrophy. No significant extraaxial fluid collection is present. The brainstem and cerebellum are within normal limits. Vascular: Atherosclerotic calcifications are present within the cavernous internal carotid arteries bilaterally. No hyperdense vessel is present. Calcifications are also present at the dural margin of the right vertebral artery. Skull: Calvarium is intact. No focal lytic or blastic lesions are present. No significant extracranial scalp soft tissue injury is present. CT MAXILLOFACIAL FINDINGS Osseous: Bilateral nasal bone fractures are displaced to the left. Nasal septal fracture is noted. Septal fracture is displaced to the right anteriorly and to the left posteriorly. A small fluid collection in the posterior right maxillary sinus suggests an occult fracture. No discrete fracture is evident. A remote inferior orbital fracture is present  on the left. No fluid is present in the left maxillary sinus. Small fluid levels are noted within the sphenoid sinus bilaterally, right greater than left. No discrete fracture is evident. Zygomatic arch is intact bilaterally. Pterygoid plates and mandible are within normal limits. Dental disease is evident. No focal lytic or blastic lesions are present. Hyperostosis frontalis internus is noted. Orbits: Globes and orbits are otherwise within normal limits. Sinuses: Minimal fluid in the right maxillary sinus and bilateral sphenoid sinuses raises concern for occult fracture. This could be secondary to the hemorrhage within the nasal cavity. Soft tissues: Extensive soft tissue density within the nasal cavity likely represents hemorrhage. Marked inflammatory changes surround the nasal fractures. Facial soft tissues are otherwise within normal limits. CT CERVICAL SPINE FINDINGS Alignment: No significant listhesis is  present. Straightening of the normal cervical lordosis is again noted. Skull base and vertebrae: The craniocervical junction demonstrates degenerative change at C1-2. No acute abnormalities are present. Vertebral body heights are maintained. No acute fracture is present. Soft tissues and spinal canal: No prevertebral fluid or swelling. No visible canal hematoma. Extensive vascular calcifications are present. Dominant right thyroid nodule measuring 3.5 x 3.2 x at least 3.2 Cm is stable. 1.5 cm calcified left thyroid nodule is stable. No new thyroid lesions are present. Disc levels: Advanced degenerative changes in the cervical spine are again noted. Upper chest: Minimal scarring at the right apex is stable. Lung apices are otherwise clear. Thoracic inlet is normal. IMPRESSION: 1. Bilateral nasal bone fractures are displaced to the left. 2. Nasal septal fracture is displaced to the right anteriorly and to the left posteriorly. 3. Extensive soft tissue density within the nasal cavity likely represents hemorrhage. 4. Small fluid levels in the right maxillary sinus and bilateral sphenoid sinuses raises concern for an occult fracture. This could be secondary to the hemorrhage within the nasal cavity. No discrete fracture is evident. 5. Remote inferior orbital fracture on the left. 6. No acute intracranial abnormality or significant interval change. 7. Stable moderate atrophy and white matter disease. 8. Stable remote left frontal infarct. 9. Stable advanced degenerative changes of the cervical spine without evidence for acute fracture or traumatic subluxation. 10. Stable 3.5 cm right and 1.5 cm left thyroid nodules. This has been evaluated on previous imaging. (ref: J Am Coll Radiol. 2015 Feb;12(2): 143-50). 11. Extensive atherosclerotic calcifications are present in the carotid and vertebral arteries. Electronically Signed   By: Marin Roberts M.D.   On: 09/21/2019 05:01   CT Cervical Spine Wo Contrast  Result  Date: 09/21/2019 CLINICAL DATA:  Head injury.  Fall from a standing position. EXAM: CT HEAD WITHOUT CONTRAST CT MAXILLOFACIAL WITHOUT CONTRAST CT CERVICAL SPINE WITHOUT CONTRAST TECHNIQUE: Multidetector CT imaging of the head, cervical spine, and maxillofacial structures were performed using the standard protocol without intravenous contrast. Multiplanar CT image reconstructions of the cervical spine and maxillofacial structures were also generated. COMPARISON:  CT head 10/21/2017. CT head and cervical spine 01/26/2017 FINDINGS: CT HEAD FINDINGS Brain: Remote left frontal encephalomalacia is stable. Moderate generalized atrophy and white matter disease is stable. No acute cortical infarct is present. No parenchymal hemorrhage is present. The ventricles are proportionate to the degree of atrophy. No significant extraaxial fluid collection is present. The brainstem and cerebellum are within normal limits. Vascular: Atherosclerotic calcifications are present within the cavernous internal carotid arteries bilaterally. No hyperdense vessel is present. Calcifications are also present at the dural margin of the right vertebral artery. Skull: Calvarium is intact. No focal lytic  or blastic lesions are present. No significant extracranial scalp soft tissue injury is present. CT MAXILLOFACIAL FINDINGS Osseous: Bilateral nasal bone fractures are displaced to the left. Nasal septal fracture is noted. Septal fracture is displaced to the right anteriorly and to the left posteriorly. A small fluid collection in the posterior right maxillary sinus suggests an occult fracture. No discrete fracture is evident. A remote inferior orbital fracture is present on the left. No fluid is present in the left maxillary sinus. Small fluid levels are noted within the sphenoid sinus bilaterally, right greater than left. No discrete fracture is evident. Zygomatic arch is intact bilaterally. Pterygoid plates and mandible are within normal limits.  Dental disease is evident. No focal lytic or blastic lesions are present. Hyperostosis frontalis internus is noted. Orbits: Globes and orbits are otherwise within normal limits. Sinuses: Minimal fluid in the right maxillary sinus and bilateral sphenoid sinuses raises concern for occult fracture. This could be secondary to the hemorrhage within the nasal cavity. Soft tissues: Extensive soft tissue density within the nasal cavity likely represents hemorrhage. Marked inflammatory changes surround the nasal fractures. Facial soft tissues are otherwise within normal limits. CT CERVICAL SPINE FINDINGS Alignment: No significant listhesis is present. Straightening of the normal cervical lordosis is again noted. Skull base and vertebrae: The craniocervical junction demonstrates degenerative change at C1-2. No acute abnormalities are present. Vertebral body heights are maintained. No acute fracture is present. Soft tissues and spinal canal: No prevertebral fluid or swelling. No visible canal hematoma. Extensive vascular calcifications are present. Dominant right thyroid nodule measuring 3.5 x 3.2 x at least 3.2 Cm is stable. 1.5 cm calcified left thyroid nodule is stable. No new thyroid lesions are present. Disc levels: Advanced degenerative changes in the cervical spine are again noted. Upper chest: Minimal scarring at the right apex is stable. Lung apices are otherwise clear. Thoracic inlet is normal. IMPRESSION: 1. Bilateral nasal bone fractures are displaced to the left. 2. Nasal septal fracture is displaced to the right anteriorly and to the left posteriorly. 3. Extensive soft tissue density within the nasal cavity likely represents hemorrhage. 4. Small fluid levels in the right maxillary sinus and bilateral sphenoid sinuses raises concern for an occult fracture. This could be secondary to the hemorrhage within the nasal cavity. No discrete fracture is evident. 5. Remote inferior orbital fracture on the left. 6. No acute  intracranial abnormality or significant interval change. 7. Stable moderate atrophy and white matter disease. 8. Stable remote left frontal infarct. 9. Stable advanced degenerative changes of the cervical spine without evidence for acute fracture or traumatic subluxation. 10. Stable 3.5 cm right and 1.5 cm left thyroid nodules. This has been evaluated on previous imaging. (ref: J Am Coll Radiol. 2015 Feb;12(2): 143-50). 11. Extensive atherosclerotic calcifications are present in the carotid and vertebral arteries. Electronically Signed   By: San Morelle M.D.   On: 09/21/2019 05:01   DG Knee Complete 4 Views Right  Result Date: 09/21/2019 CLINICAL DATA:  Fall EXAM: RIGHT KNEE - COMPLETE 4+ VIEW COMPARISON:  Radiograph 01/15/2016 FINDINGS: Moderate to severe tricompartmental degenerative changes are present throughout the knee with exuberant periarticular spurring, spurring of the tibial spines, and multiple corticated ossifications in the joint recesses which may reflect intra-articular bodies, many of which are similar to comparison. No discernible acute fracture or traumatic malalignment. Only a small suprapatellar effusion is present. Vascular calcium in the posterior soft tissues. 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 No definite acute osseous injury or traumatic malalignment. Trace effusion. Atherosclerosis Electronically Signed   By: Kreg Shropshire M.D.   On: 09/21/2019 06:55   CT Maxillofacial Wo Contrast  Result Date: 09/21/2019 CLINICAL DATA:  Head injury.  Fall from a standing position. EXAM: CT HEAD WITHOUT CONTRAST CT MAXILLOFACIAL WITHOUT CONTRAST CT CERVICAL SPINE WITHOUT CONTRAST TECHNIQUE: Multidetector CT imaging of the head, cervical spine, and maxillofacial structures were performed using the standard protocol without intravenous  contrast. Multiplanar CT image reconstructions of the cervical spine and maxillofacial structures were also generated. COMPARISON:  CT head 10/21/2017. CT head and cervical spine 01/26/2017 FINDINGS: CT HEAD FINDINGS Brain: Remote left frontal encephalomalacia is stable. Moderate generalized atrophy and white matter disease is stable. No acute cortical infarct is present. No parenchymal hemorrhage is present. The ventricles are proportionate to the degree of atrophy. No significant extraaxial fluid collection is present. The brainstem and cerebellum are within normal limits. Vascular: Atherosclerotic calcifications are present within the cavernous internal carotid arteries bilaterally. No hyperdense vessel is present. Calcifications are also present at the dural margin of the right vertebral artery. Skull: Calvarium is intact. No focal lytic or blastic lesions are present. No significant extracranial scalp soft tissue injury is present. CT MAXILLOFACIAL FINDINGS Osseous: Bilateral nasal bone fractures are displaced to the left. Nasal septal fracture is noted. Septal fracture is displaced to the right anteriorly and to the left posteriorly. A small fluid collection in the posterior right maxillary sinus suggests an occult fracture. No discrete fracture is evident. A remote inferior orbital fracture is present on the left. No fluid is present in the left maxillary sinus. Small fluid levels are noted within the sphenoid sinus bilaterally, right greater than left. No discrete fracture is evident. Zygomatic arch is intact bilaterally. Pterygoid plates and mandible are within normal limits. Dental disease is evident. No focal lytic or blastic lesions are present. Hyperostosis frontalis internus is noted. Orbits: Globes and orbits are otherwise within normal limits. Sinuses: Minimal fluid in the right maxillary sinus and bilateral sphenoid sinuses raises concern for occult fracture. This could be secondary to the  hemorrhage within the nasal cavity. Soft tissues: Extensive soft tissue density within the nasal cavity likely represents hemorrhage. Marked inflammatory changes surround the nasal fractures. Facial soft tissues are otherwise within normal limits. CT CERVICAL SPINE FINDINGS Alignment: No significant listhesis is present. Straightening of the normal cervical lordosis is again noted. Skull base and vertebrae: The craniocervical junction demonstrates degenerative change at C1-2. No acute abnormalities are present. Vertebral body heights are maintained. No acute fracture is present. Soft tissues and spinal canal: No prevertebral fluid or swelling. No visible canal hematoma. Extensive vascular calcifications are present. Dominant right thyroid nodule measuring 3.5 x 3.2 x at least 3.2 Cm is stable. 1.5 cm calcified left thyroid nodule is stable. No new thyroid lesions are present. Disc levels: Advanced degenerative changes in the cervical spine are again noted. Upper chest: Minimal scarring at the right apex is stable. Lung apices are otherwise clear. Thoracic inlet is normal. IMPRESSION: 1. Bilateral nasal bone fractures are displaced to the left. 2. Nasal septal fracture is displaced to the right anteriorly and to the left posteriorly. 3. Extensive soft tissue density within the nasal cavity likely represents hemorrhage. 4. Small fluid levels in the right maxillary sinus and bilateral sphenoid sinuses raises concern for an occult fracture. This could be secondary to the hemorrhage within the nasal cavity. No  discrete fracture is evident. 5. Remote inferior orbital fracture on the left. 6. No acute intracranial abnormality or significant interval change. 7. Stable moderate atrophy and white matter disease. 8. Stable remote left frontal infarct. 9. Stable advanced degenerative changes of the cervical spine without evidence for acute fracture or traumatic subluxation. 10. Stable 3.5 cm right and 1.5 cm left thyroid  nodules. This has been evaluated on previous imaging. (ref: J Am Coll Radiol. 2015 Feb;12(2): 143-50). 11. Extensive atherosclerotic calcifications are present in the carotid and vertebral arteries. Electronically Signed   By: Marin Roberts M.D.   On: 09/21/2019 05:01    ____________________________________________   PROCEDURES     Procedures   ____________________________________________   INITIAL IMPRESSION / MDM / ASSESSMENT AND PLAN / ED COURSE  As part of my medical decision making, I reviewed the following data within the electronic MEDICAL RECORD NUMBER   74 year old female presented with above-stated history and physical exam following accidental fall.  Patient's husband presents emergency department informed me that the patient was unable to ambulate following the fall secondary to discomfort.  Patient continues to have inability to ambulate due to discomfort.  Patient walks with a walker at baseline per the patient's husband.  Appropriate imaging was performed CT head maxillofacial and cervical spine which revealed nasal bone fractures and nasal septal fracture.  Patient with no active epistaxis at present.  Patient will be referred to ENT regarding nasal bone and septal fracture.  X-ray of the shoulder and knee revealed no fracture dislocation.  Given inability to ambulate and perform ADLs social work/PT consulted.     ____________________________________________  FINAL CLINICAL IMPRESSION(S) / ED DIAGNOSES  Final diagnoses:  Closed fracture of nasal bone, initial encounter  Ambulatory dysfunction     MEDICATIONS GIVEN DURING THIS VISIT:  Medications  oxyCODONE-acetaminophen (PERCOCET/ROXICET) 5-325 MG per tablet 1 tablet (1 tablet Oral Given 09/21/19 1212)  oxyCODONE-acetaminophen (PERCOCET/ROXICET) 5-325 MG per tablet 1 tablet (1 tablet Oral Given 09/21/19 2228)     ED Discharge Orders    None      *Please note:  Molly [name] was evaluated in Emergency  Department on 09/21/2019 for the symptoms described in the history of present illness. She was evaluated in the context of the global COVID-19 pandemic, which necessitated consideration that the patient might be at risk for infection with the SARS-CoV-2 virus that causes COVID-19. Institutional protocols and algorithms that pertain to the evaluation of patients at risk for COVID-19 are in a state of rapid change based on information released by regulatory bodies including the CDC and federal and state organizations. These policies and algorithms were followed during the patient's care in the ED.  Some ED evaluations and interventions may be delayed as a result of limited staffing during and after the pandemic.*  Note:  This document was prepared using Dragon voice recognition software and may include unintentional dictation errors.   Darci Current, MD 09/21/19 2236

## 2019-09-21 NOTE — ED Notes (Signed)
Pt given food tray and drink. Repositioned.

## 2019-09-21 NOTE — TOC Progression Note (Addendum)
Transition of Care [facility]) - Progression Note    Patient Details  Name: Molly [name] MRN: [id number] Date of Birth: 03/04/1946  [facility] of [facility]) CM/SW Contact  [place] Cellar, RN Phone Number: 09/21/2019, 4:15 PM  Clinical Narrative:    Spoke to spouse, Ronne Binning and confirmed PT recommendation for SNF. Patient would like to try [facility] for availability first since her and her husband had both been to [facility] in the past. FL2 and PASRR completed and sent to local facilities for review. Will start insurance auth once bed offer made and family accepted.    Expected Discharge Plan: Home w Home Health Services Barriers to Discharge: Continued Medical Work up  Expected Discharge Plan and Services Expected Discharge Plan: Home w Home Health Services       Living arrangements for the past 2 months: Single Family Home                                       Social Determinants of Health (SDOH) Interventions    Readmission Risk Interventions No flowsheet data found.

## 2019-09-21 NOTE — ED Notes (Addendum)
Pt provided peri care. Briefs were wet with urine. Small smear of BM noted. Pt on 2-3L of O2 via [place] as sats 88-90% RA. Briefs changed, bed pad changed, linens changed. Pt states has L sided weakness from previous strokes. States uses walker at home and can usually make it to bathroom in time to urinate. Pt repositioned by this RN and [employer]. Assisted to drink some water. Prompted again to notify this RN if she needs to urinate or have BM so she can be placed on bedpan. Educated again about call bell. Bed locked low. Rails up. Call bell within reach.

## 2019-09-21 NOTE — Evaluation (Signed)
Physical Therapy Evaluation Patient Details Name: Molly [name] MRN: [id number] DOB: 1945-05-24 Today's Date: 09/21/2019   History of Present Illness  Pt is 74 yo female that presented to ED after a fall, has complaints of generalized weakness as well. PMH of CVA (with L sided residual deficits), seizures, DM, COPD, anxiety, HLD, HTN, L toe amputations. Workup + for bilateral nasal bone fx, remote inferior orbital fracture (L).    Clinical Impression  Patient alert, pleasant, reported no pain initially but stated she had face, R arm and generalized pain with movement due to recent fall. At baseline pt stated she needs assistance for ADLs from husband, walks with a RW, and falls at least once a week. Husband performs homemaking duties.  The patient needed maxA for supine to sit, and maxAx2 to return to supine. Unable to maintain balance without at least modA. Sit <> stand attempted with maxA2 and RW twice, pt unable to come to fully standing. The patient exhibited generalized weakness of bilateral UE and LE, L weaker than R due to chronic L hemiparesis.  Overall the patient demonstrated deficits (see "PT Problem List") that impede the patient's functional abilities, safety, and mobility and would benefit from skilled PT intervention. Recommendation is SNF due to current level of assistance needed and decline in functional mobility.     Follow Up Recommendations SNF    Equipment Recommendations  Other (comment) (TBD at next venue of care)    Recommendations for Other Services OT consult     Precautions / Restrictions Precautions Precautions: Fall Precaution Comments: L hemiparesis      Mobility  Bed Mobility Overal bed mobility: Needs Assistance Bed Mobility: Supine to Sit;Sit to Supine     Supine to sit: Max assist;HOB elevated Sit to supine: Max assist;+2 for physical assistance      Transfers Overall transfer level: Needs assistance Equipment used: Rolling walker (2  wheeled) Transfers: Sit to/from Stand Sit to Stand: Max assist;+2 physical assistance         General transfer comment: attempted twice, pt unable to grip walker with L hand, unable to straighten legs into fully extended, maxAx2 to remain upright  Ambulation/Gait                Stairs            Wheelchair Mobility    Modified Rankin (Stroke Patients Only)       Balance Overall balance assessment: Needs assistance Sitting-balance support: Feet supported Sitting balance-Leahy Scale: Zero Sitting balance - Comments: at least modA to maintain seated balance with LE support, Pt holding on to PT waist     Standing balance-Leahy Scale: Zero                               Pertinent Vitals/Pain Pain Assessment: Faces Faces Pain Scale: Hurts whole lot Pain Location: face, R arm Pain Descriptors / Indicators: Aching;Grimacing;Moaning Pain Intervention(s): Limited activity within patient's tolerance;Monitored during session;Repositioned    Home Living Family/patient expects to be discharged to:: Private residence Living Arrangements: Spouse/significant other Available Help at Discharge: Family;Available 24 hours/day Type of Home: House Home Access: Stairs to enter;Ramped entrance     Home Layout: Multi-level Home Equipment: Cane - single point;Walker - standard;Bedside commode;Hand held shower head;Walker - 2 wheels;Grab bars - toilet;Grab bars - tub/shower      Prior Function Level of Independence: Needs assistance   Gait / Transfers Assistance Needed: pt stated  she ambulates in home with RW  ADL's / Homemaking Assistance Needed: husband assists with bed mobility, ADLs, performs meals, cooking, cleaning        Hand Dominance   Dominant Hand: Right    Extremity/Trunk Assessment   Upper Extremity Assessment Upper Extremity Assessment: Generalized weakness;RUE deficits/detail;LUE deficits/detail RUE Deficits / Details: able to lift UE  against gravity RUE Sensation: WNL RUE Coordination: WNL LUE Deficits / Details: hemiparesis noted, L hand fisted, able to adduct UE, PROM for any further motion LUE Sensation: WNL LUE Coordination: decreased fine motor;decreased gross motor    Lower Extremity Assessment Lower Extremity Assessment: RLE deficits/detail;LLE deficits/detail RLE Deficits / Details: able to lift against gravity, 3/5 unable to maintain position against resistance LLE Deficits / Details: unable to lift against gravity, some muscle activation noted with exercises       Communication   Communication: No difficulties  Cognition Arousal/Alertness: Awake/alert Behavior During Therapy: WFL for tasks assessed/performed Overall Cognitive Status: Within Functional Limits for tasks assessed                                        General Comments      Exercises     Assessment/Plan    PT Assessment Patient needs continued PT services  PT Problem List Decreased strength;Decreased activity tolerance;Decreased balance;Pain;Decreased knowledge of use of DME;Decreased mobility;Decreased range of motion;Decreased safety awareness;Decreased knowledge of precautions       PT Treatment Interventions DME instruction;Therapeutic exercise;Gait training;Balance training;Wheelchair mobility training;Stair training;Neuromuscular re-education;Functional mobility training;Therapeutic activities;Patient/family education    PT Goals (Current goals can be found in the Care Plan section)  Acute Rehab PT Goals Patient Stated Goal: to get her strength back PT Goal Formulation: With patient Time For Goal Achievement: 10/05/19 Potential to Achieve Goals: Good    Frequency Min 2X/week   Barriers to discharge Decreased caregiver support      Co-evaluation               AM-PAC PT "6 Clicks" Mobility  Outcome Measure Help needed turning from your back to your side while in a flat bed without using  bedrails?: Total Help needed moving from lying on your back to sitting on the side of a flat bed without using bedrails?: Total Help needed moving to and from a bed to a chair (including a wheelchair)?: Total Help needed standing up from a chair using your arms (e.g., wheelchair or bedside chair)?: Total Help needed to walk in hospital room?: Total Help needed climbing 3-5 steps with a railing? : Total 6 Click Score: 6    End of Session Equipment Utilized During Treatment: Gait belt Activity Tolerance: Patient tolerated treatment well Patient left: with call bell/phone within reach;in bed Nurse Communication: Mobility status PT Visit Diagnosis: Other abnormalities of gait and mobility (R26.89);Muscle weakness (generalized) (M62.81);Pain;Difficulty in walking, not elsewhere classified (R26.2) Pain - Right/Left: Right Pain - part of body: Shoulder;Knee    Time: 5956-3875 PT Time Calculation (min) (ACUTE ONLY): 36 min   Charges:   PT Evaluation $PT Eval Moderate Complexity: 1 Mod PT Treatments $Therapeutic Activity: 23-37 mins        Olga Coaster PT, DPT 4:28 PM,09/21/19

## 2019-09-21 NOTE — ED Notes (Signed)
Pt reports history of seizures, last seizure being last year.  Pt a&o x4

## 2019-09-21 NOTE — ED Notes (Signed)
This RN at bedside. Pt oxygen sats 88% on RA. Pt currently sleeping. Pt awakened and oxygen sats increased 91%. Pt placed on 2L [place] via mouth for comfort while sleeping. [place] not placed in nose due to pt's facial fractures.

## 2019-09-21 NOTE — ED Notes (Signed)
Pt's husband called wanting update. Given brief update with pt's verbal okay.

## 2019-09-21 NOTE — NC FL2 (Signed)
[place] LEVEL OF CARE SCREENING TOOL     IDENTIFICATION  Patient Name: Molly [name] Birthdate: [birth year] Sex: female Admission Date (Current Location): 09/21/2019  [place] and [place] Number:  Engineering geologist and Address:  [facility], [address] [place], [place] [address]      Provider Number: [id number]  Attending Physician Name and Address:  Gregor Hams, MD  Relative Name and Phone Number:       Current Level of Care: Hospital Recommended Level of Care: [place] Prior Approval Number:    Date Approved/Denied:   PASRR Number: [id number]  Discharge Plan: SNF    Current Diagnoses: Patient Active Problem List   Diagnosis Date Noted  . Pressure injury of skin 04/28/2016  . Acute GI bleeding 04/27/2016  . Anemia, posthemorrhagic, acute   . Blood in stool   . Gastritis without bleeding   . Involuntary movements   . Acute respiratory failure ([place]) 03/05/2016  . Acute encephalopathy 02/06/2016  . Acute cerebrovascular accident (CVA) ([place])   . Altered mental status   . 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 Cellulitis of left lower extremity 11/19/2014  . COPD (chronic obstructive pulmonary disease) ([place]) 11/19/2014  . HTN (hypertension) 11/19/2014  . HLD (hyperlipidemia) 11/19/2014  . Diabetes mellitus ([place]) 05/21/2014  . Chronic anemia 05/21/2014    Orientation RESPIRATION BLADDER Height & Weight     Self, Time, Situation, Place  Normal Continent Weight: 99.8 kg Height:  5\' 6"  (167.6 cm)  BEHAVIORAL SYMPTOMS/MOOD NEUROLOGICAL BOWEL NUTRITION STATUS      Continent Diet  AMBULATORY STATUS COMMUNICATION OF NEEDS Skin   Extensive Assist Verbally Normal                       Personal Care Assistance Level of Assistance  Bathing, Dressing Bathing Assistance: Limited assistance   Dressing Assistance: Limited assistance     Functional Limitations Info             SPECIAL CARE FACTORS  FREQUENCY  PT (By licensed PT), OT (By licensed OT)     PT Frequency: min 5xweekly OT Frequency: min 5xweekly            Contractures      Additional Factors Info                  Current Medications (09/21/2019):  This is the current hospital active medication list No current facility-administered medications for this encounter.   Current Outpatient Medications  Medication Sig Dispense Refill  . acetaminophen (TYLENOL) 325 MG tablet Take 650 mg by mouth every 6 (six) hours as needed.    Marland Kitchen albuterol (PROVENTIL HFA;VENTOLIN HFA) 108 (90 Base) MCG/ACT inhaler ALBUTEROL 90 MCG/ACT AERS    . amitriptyline (ELAVIL) 75 MG tablet Take 75 mg by mouth at bedtime.     Marland Kitchen aspirin EC 81 MG tablet ASPIRIN 81 MG TABS    . atorvastatin (LIPITOR) 40 MG tablet Take 1 tablet (40 mg total) by mouth daily at 6 PM. 30 tablet 2  . B Complex-C (SUPER B COMPLEX PO) Take 1 tablet by mouth daily.     . canagliflozin (INVOKANA) 100 MG TABS tablet Take by mouth daily before breakfast.    . Cholecalciferol (VITAMIN D3) 1000 UNITS CAPS Take 1,000 Units by mouth daily.    . clopidogrel (PLAVIX) 75 MG tablet Take 75 mg by mouth.    . diclofenac sodium (VOLTAREN) 1 % GEL  Apply 4 g topically 4 (four) times daily.    . insulin aspart protamine - aspart (NOVOLOG 70/30 MIX) (70-30) 100 UNIT/ML FlexPen Inject 90 Units into the skin 2 (two) times daily.    . magnesium oxide (MAG-OX) 400 MG tablet Take 400 mg by mouth 2 (two) times daily.    . metFORMIN (GLUCOPHAGE-XR) 500 MG 24 hr tablet Take 500 mg by mouth 2 (two) times daily.    . polyethylene glycol (MIRALAX / GLYCOLAX) packet Take 17 g by mouth daily.    Marland Kitchen senna-docusate (SENOKOT-S) 8.6-50 MG tablet Take 1 tablet by mouth at bedtime.    . sitaGLIPtin (JANUVIA) 100 MG tablet Take 100 mg by mouth daily.    . traZODone (DESYREL) 50 MG tablet        Discharge Medications: Please see discharge summary for a list of discharge medications.  Relevant Imaging  Results:  Relevant Lab Results:   Additional Information SS#[id number]  [place] Cellar, RN

## 2019-09-21 NOTE — ED Notes (Signed)
EDP Siadecki notified of pt's pain level and request for med/food/drink. Awaiting orders.

## 2019-09-21 NOTE — ED Triage Notes (Signed)
Pt from home with complaint of fall this AM. Pt reports she was standing up from the toilet and fell, does not report LOC, but unsure why she fell. Pt presents with minor nosebleed, pain to right shoulder, back, and knee  Pt uses walker/wheelchair at home. Pt reports she is unable to stand at this time, which is abnormal for her.   Pt reports taking a blood thinner, is unsure which one. Pt a&o, NAD at this time

## 2019-09-22 NOTE — ED Notes (Signed)
Pt currently resting in bed at this time, blankets over pt, pt watching news and has no complaints. Denies any needs. Call light remains beside right hand and remote on table at bedside. No pain

## 2019-09-22 NOTE — TOC Progression Note (Addendum)
Transition of Care [facility]) - Progression Note    Patient Details  Name: Molly [name] MRN: [id number] Date of Birth: 01/25/1946  Transition of Care [facility]) CM/SW Contact  [place] Cellar, RN Phone Number: 09/22/2019, 11:45 AM  Clinical Narrative:    Received call from [facility] with authorization for SNF transfer. BNL#[id number]. Approved 5 days. Follow up via Fax [phone] to [employer] at [facility]. RN CM notified Debra @ [facility] for transfer today. Awaiting room number for SNF.   Confirmed patient has had COVID vaccine x2. Anticipate patient will transfer to SNF today. RN CM contacted spouse and updated that patient would transfer today once room number obtained.   Debbie @ SNF states she is waiting on SNF finance department for level of care.    Expected Discharge Plan: Home w Home Health Services Barriers to Discharge: Continued Medical Work up  Expected Discharge Plan and Services Expected Discharge Plan: Home w Home Health Services       Living arrangements for the past 2 months: Single Family Home                                       Social Determinants of Health (SDOH) Interventions    Readmission Risk Interventions No flowsheet data found.

## 2019-09-22 NOTE — TOC Transition Note (Signed)
Transition of Care [facility]) - CM/SW Discharge Note   Patient Details  Name: Molly [name] MRN: [id number] Date of Birth: 19-Mar-1946  Transition of Care [facility]) CM/SW Contact:  [place] Cellar, RN Phone Number: 09/22/2019, 2:10 PM   Clinical Narrative:    Faxed AVS to SNF @ [phone] at request of Dorie Rank. Also sent securely to [email or web address] in order for medications to be ordered. No other needs. TOC signing off.      Barriers to Discharge: Continued Medical Work up   Patient Goals and CMS Choice Patient states their goals for this hospitalization and ongoing recovery are:: Return home with spouse      Discharge Placement                       Discharge Plan and Services                                     Social Determinants of Health (SDOH) Interventions     Readmission Risk Interventions No flowsheet data found.

## 2019-09-22 NOTE — ED Notes (Signed)
Pt found in room awake at this time, pt had saturated bed in urine. Pt cleaned by this nurse and [place] Dach. Pt sheets cleaned and replaced, new chuck placed, brief, and purewick applied. Pt cleaned with warm wipes from incontinence. Pt had shirt that she was wearing on arrival with blood on It under pt gown, noticed when cleaned at this time, this was cut off and new gown placed on pt. Pt face also cleaned of dried blood that was noted on cleaning. Pillow placed behind head, HOB placed in position of comfort, call light remains in reach by right hand, TV on for pt and lights dimmed. Pt denies any needs at this time and currently under blankets.

## 2019-09-22 NOTE — TOC Progression Note (Signed)
Transition of Care [facility]) - Progression Note    Patient Details  Name: TOSCA PLETZ MRN: [id number] Date of Birth: 02-Jan-1946  Transition of Care [facility]) CM/SW Contact  [place] Cellar, RN Phone Number: 09/22/2019, 8:59 AM  Clinical Narrative:    Received bed offer from patients first choice [facility]. RN CM will begin insurance authorization.    Expected Discharge Plan: Home w Home Health Services Barriers to Discharge: Continued Medical Work up  Expected Discharge Plan and Services Expected Discharge Plan: Home w Home Health Services       Living arrangements for the past 2 months: Single Family Home                                       Social Determinants of Health (SDOH) Interventions    Readmission Risk Interventions No flowsheet data found.

## 2019-09-22 NOTE — ED Provider Notes (Signed)
-----------------------------------------   1:47 PM on 09/22/2019 -----------------------------------------  Social work has been able to place the patient at [facility].  We will discharge from the emergency department to the nursing facility.   Minna Antis, MD 09/22/19 1348

## 2019-09-22 NOTE — ED Notes (Signed)
Called [facility] for transport to [facility]  [phone]

## 2019-09-22 NOTE — ED Notes (Signed)
E-signature not working at this time. Pt verbalized understanding of D/C instructions, prescriptions and follow up care with no further questions at this time. Pt husband updated on patient's departure to [facility]. Pt verbalizes consent to be transferred to facility at this time. EMS at bedside preparing to transport.

## 2019-12-11 ENCOUNTER — Inpatient Hospital Stay
Admission: EM | Admit: 2019-12-11 | Discharge: 2019-12-18 | DRG: 872 | Disposition: A | Discharge status: Skilled Nursing Facility | Payer: Medicare HMO | Source: Skilled Nursing Facility | Attending: Family Medicine | Admitting: Family Medicine

## 2019-12-11 ENCOUNTER — Other Ambulatory Visit: Payer: Self-pay

## 2019-12-11 ENCOUNTER — Encounter: Payer: Self-pay | Admitting: Radiology

## 2019-12-11 ENCOUNTER — Emergency Department: Payer: Medicare HMO

## 2019-12-11 DIAGNOSIS — K529 Noninfective gastroenteritis and colitis, unspecified: Secondary | ICD-10-CM | POA: Diagnosis not present

## 2019-12-11 DIAGNOSIS — Z823 Family history of stroke: Secondary | ICD-10-CM

## 2019-12-11 DIAGNOSIS — E119 Type 2 diabetes mellitus without complications: Secondary | ICD-10-CM | POA: Diagnosis present

## 2019-12-11 DIAGNOSIS — Z803 Family history of malignant neoplasm of breast: Secondary | ICD-10-CM

## 2019-12-11 DIAGNOSIS — A419 Sepsis, unspecified organism: Secondary | ICD-10-CM

## 2019-12-11 DIAGNOSIS — R4781 Slurred speech: Secondary | ICD-10-CM | POA: Diagnosis not present

## 2019-12-11 DIAGNOSIS — Z89422 Acquired absence of other left toe(s): Secondary | ICD-10-CM

## 2019-12-11 DIAGNOSIS — R6520 Severe sepsis without septic shock: Secondary | ICD-10-CM | POA: Diagnosis present

## 2019-12-11 DIAGNOSIS — I959 Hypotension, unspecified: Secondary | ICD-10-CM | POA: Diagnosis not present

## 2019-12-11 DIAGNOSIS — R5381 Other malaise: Secondary | ICD-10-CM | POA: Diagnosis present

## 2019-12-11 DIAGNOSIS — A4151 Sepsis due to Escherichia coli [E. coli]: Principal | ICD-10-CM | POA: Diagnosis present

## 2019-12-11 DIAGNOSIS — K5909 Other constipation: Secondary | ICD-10-CM | POA: Diagnosis present

## 2019-12-11 DIAGNOSIS — Z83438 Family history of other disorder of lipoprotein metabolism and other lipidemia: Secondary | ICD-10-CM

## 2019-12-11 DIAGNOSIS — I10 Essential (primary) hypertension: Secondary | ICD-10-CM | POA: Diagnosis present

## 2019-12-11 DIAGNOSIS — I700 Atherosclerosis of aorta: Secondary | ICD-10-CM | POA: Diagnosis present

## 2019-12-11 DIAGNOSIS — A0472 Enterocolitis due to Clostridium difficile, not specified as recurrent: Secondary | ICD-10-CM | POA: Diagnosis present

## 2019-12-11 DIAGNOSIS — K5130 Ulcerative (chronic) rectosigmoiditis without complications: Secondary | ICD-10-CM | POA: Diagnosis present

## 2019-12-11 DIAGNOSIS — Z7982 Long term (current) use of aspirin: Secondary | ICD-10-CM

## 2019-12-11 DIAGNOSIS — Z7902 Long term (current) use of antithrombotics/antiplatelets: Secondary | ICD-10-CM

## 2019-12-11 DIAGNOSIS — J449 Chronic obstructive pulmonary disease, unspecified: Secondary | ICD-10-CM | POA: Diagnosis present

## 2019-12-11 DIAGNOSIS — R109 Unspecified abdominal pain: Secondary | ICD-10-CM

## 2019-12-11 DIAGNOSIS — Z87891 Personal history of nicotine dependence: Secondary | ICD-10-CM

## 2019-12-11 DIAGNOSIS — Z20822 Contact with and (suspected) exposure to covid-19: Secondary | ICD-10-CM | POA: Diagnosis present

## 2019-12-11 DIAGNOSIS — E1169 Type 2 diabetes mellitus with other specified complication: Secondary | ICD-10-CM

## 2019-12-11 DIAGNOSIS — Z794 Long term (current) use of insulin: Secondary | ICD-10-CM

## 2019-12-11 DIAGNOSIS — Z79899 Other long term (current) drug therapy: Secondary | ICD-10-CM

## 2019-12-11 DIAGNOSIS — E785 Hyperlipidemia, unspecified: Secondary | ICD-10-CM | POA: Diagnosis present

## 2019-12-11 DIAGNOSIS — I69354 Hemiplegia and hemiparesis following cerebral infarction affecting left non-dominant side: Secondary | ICD-10-CM

## 2019-12-11 LAB — URINALYSIS, COMPLETE (UACMP) WITH MICROSCOPIC
Bacteria, UA: NONE SEEN
Bilirubin Urine: NEGATIVE
Glucose, UA: >=500 mg/dL — AB
Hgb urine dipstick: NEGATIVE
Ketones, ur: 20 mg/dL — AB
Leukocytes,Ua: NEGATIVE
Nitrite: NEGATIVE
Protein, ur: 30 mg/dL — AB
Specific Gravity, Urine: 1.025 (ref 1.005–1.030)
pH: 5 (ref 5.0–8.0)

## 2019-12-11 LAB — COMPREHENSIVE METABOLIC PANEL
ALT: 30 U/L (ref 0–44)
AST: 22 U/L (ref 15–41)
Albumin: 4.2 g/dL (ref 3.5–5.0)
Alkaline Phosphatase: 68 U/L (ref 38–126)
Anion gap: 14 (ref 5–15)
BUN: 21 mg/dL (ref 8–23)
CO2: 27 mmol/L (ref 22–32)
Calcium: 9.1 mg/dL (ref 8.9–10.3)
Chloride: 96 mmol/L — ABNORMAL LOW (ref 98–111)
Creatinine, Ser: 0.87 mg/dL (ref 0.44–1.00)
GFR calc Af Amer: >60 mL/min (ref >60)
GFR calc non Af Amer: >60 mL/min (ref >60)
Glucose, Bld: 255 mg/dL — ABNORMAL HIGH (ref 70–99)
Potassium: 4.3 mmol/L (ref 3.5–5.1)
Sodium: 137 mmol/L (ref 135–145)
Total Bilirubin: 1.4 mg/dL — ABNORMAL HIGH (ref 0.3–1.2)
Total Protein: 8.3 g/dL — ABNORMAL HIGH (ref 6.5–8.1)

## 2019-12-11 LAB — TROPONIN I (HIGH SENSITIVITY): Troponin I (High Sensitivity): 8 ng/L (ref <18)

## 2019-12-11 LAB — LACTIC ACID, PLASMA: Lactic Acid, Venous: 1.7 mmol/L (ref 0.5–1.9)

## 2019-12-11 LAB — LIPASE, BLOOD: Lipase: 26 U/L (ref 11–51)

## 2019-12-11 MED ORDER — SODIUM CHLORIDE 0.9 % IV SOLN
1.0000 g | Freq: Once | INTRAVENOUS | Status: AC
  Administered 2019-12-12: 1 g via INTRAVENOUS
  Filled 2019-12-11: qty 1

## 2019-12-11 MED ORDER — IOHEXOL 300 MG/ML  SOLN
100.0000 mL | Freq: Once | INTRAMUSCULAR | Status: AC | PRN
  Administered 2019-12-11: 100 mL via INTRAVENOUS

## 2019-12-11 MED ORDER — IOHEXOL 9 MG/ML PO SOLN: 1000.0000 mL | Freq: Once | ORAL | Status: DC | PRN

## 2019-12-11 NOTE — ED Provider Notes (Signed)
[facility] Emergency Department Provider Note   ____________________________________________   First MD Initiated Contact with Patient 12/11/19 2205     (approximate)  I have reviewed the triage vital signs and the nursing notes.   HISTORY  Chief Complaint No chief complaint on file.  Chief complaint is abdominal pain and distention  HPI Molly [name] is a 74 y.o. female [facility]  Per her husband she is supposed to be on MiraLAX chronically for constipation.  She has not had a stool for several days and she is gotten 2 enemas prior to arrival.  She is running a fever of 101.  Her O2 sats are about 90% on her usual 2 L of the we have turned her oxygen up to 4 L.  Patient has a lot of abdominal distention with diffuse abdominal pain.  There are some right upper quadrant fullness with palpation as well.  Her pain as I understand it is deep and achy moderately severe.        Past Medical History:  Diagnosis Date  . Anxiety   . COPD (chronic obstructive pulmonary disease) (HCC)   . Diabetes (HCC)   . Hemorrhoid   . HLD (hyperlipidemia)   . HTN (hypertension)   . Stroke [facility](HCC) 2008   left weakness    Patient Active Problem List   Diagnosis Date Noted  . Pressure injury of skin 04/28/2016  . Acute GI bleeding 04/27/2016  . Anemia, posthemorrhagic, acute   . Blood in stool   . Gastritis without bleeding   . Involuntary movements   . Acute respiratory failure (HCC) 03/05/2016  . Acute encephalopathy 02/06/2016  . Acute cerebrovascular accident (CVA) (HCC)   . Altered mental status   . 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 Cellulitis of left lower extremity 11/19/2014  . COPD (chronic obstructive pulmonary disease) (HCC) 11/19/2014  . HTN (hypertension) 11/19/2014  . HLD (hyperlipidemia) 11/19/2014  . Diabetes mellitus (HCC) 05/21/2014  . Chronic anemia 05/21/2014    Past Surgical History:  Procedure Laterality Date  . AMPUTATION  Left 05/22/2014   Procedure: AMPUTATION RAY LEFT FOURTH AND FIFTH ;  Surgeon: Toni ArthursJohn Hewitt, MD;  [facility];  Service: Orthopedics;  Laterality: Left;  . CATARACT EXTRACTION    . COLONOSCOPY  2004  . COLONOSCOPY WITH PROPOFOL N/A 04/29/2016   Procedure: COLONOSCOPY WITH PROPOFOL;  Surgeon: Wyline MoodKiran Anna, MD;  [facility] END[facility]COPY;  Service: Gastroenterology;  Laterality: N/A;  . ESOPHAGOGASTRODUODENOSCOPY (EGD) WITH PROPOFOL N/A 04/27/2016   Procedure: ESOPHAGOGASTRODUODENOSCOPY (EGD) WITH PROPOFOL;  Surgeon: Midge Miniumarren Wohl, MD;  Location: [facility] ENDOSCOPY;  Service: Endoscopy;  Laterality: N/A;    Prior to Admission medications   Medication Sig Start Date End Date Taking? Authorizing Provider  acetaminophen (TYLENOL) 325 MG tablet Take 650 mg by mouth every 6 (six) hours as needed.    [provider]  albuterol (PROVENTIL HFA;VENTOLIN HFA) 108 (90 Base) MCG/ACT inhaler ALBUTEROL 90 MCG/ACT AERS 05/20/09   [provider]  amitriptyline (ELAVIL) 75 MG tablet Take 75 mg by mouth at bedtime.     [provider]  aspirin EC 81 MG tablet ASPIRIN 81 MG TABS 02/28/09   [provider]  atorvastatin (LIPITOR) 40 MG tablet Take 1 tablet (40 mg total) by mouth daily at 6 PM. 02/03/16   Enid BaasKalisetti, Radhika, MD  B Complex-C (SUPER B COMPLEX PO) Take 1 tablet by mouth daily.     [provider]  canagliflozin [facility](INVOKANA)  100 MG TABS tablet Take by mouth daily before breakfast.    [provider]  Cholecalciferol (VITAMIN D3) 1000 UNITS CAPS Take 1,000 Units by mouth daily.    [provider]  clopidogrel (PLAVIX) 75 MG tablet Take 75 mg by mouth.    [provider]  diclofenac sodium (VOLTAREN) 1 % GEL Apply 4 g topically 4 (four) times daily.    [provider]  insulin aspart protamine - aspart (NOVOLOG 70/30 MIX) (70-30) 100 UNIT/ML FlexPen Inject 90 Units into the skin 2 (two) times daily.    [provider]  magnesium  oxide (MAG-OX) 400 MG tablet Take 400 mg by mouth 2 (two) times daily.    [provider]  metFORMIN (GLUCOPHAGE-XR) 500 MG 24 hr tablet Take 500 mg by mouth 2 (two) times daily. 09/05/19   [provider]  polyethylene glycol (MIRALAX / GLYCOLAX) packet Take 17 g by mouth daily.    [provider]  senna-docusate (SENOKOT-S) 8.6-50 MG tablet Take 1 tablet by mouth at bedtime.    [provider]  sitaGLIPtin (JANUVIA) 100 MG tablet Take 100 mg by mouth daily.    [provider]  traZODone (DESYREL) 50 MG tablet  09/11/19   [provider]    Allergies Patient has no known allergies.  Family History  Problem Relation Age of Onset  . Hyperlipidemia Mother   . Varicose Veins Mother   . Deep vein thrombosis Father   . Stroke Father   . Breast cancer Paternal Aunt   . Stroke Maternal Grandmother   . Stroke Paternal Grandmother     Social History Social History   Tobacco Use  . Smoking status: Former Smoker    Packs/day: 0.50    Years: 2.00    Pack years: 1.00    Types: Cigarettes    Quit date: 11/18/1968    Years since quitting: 51.0  . Smokeless tobacco: Never Used  Substance Use Topics  . Alcohol use: No    Alcohol/week: 0.0 standard drinks    Comment: seldom  . Drug use: No    Review of Systems  Constitutional:  fever/chills Eyes: No visual changes. ENT: No sore throat. Cardiovascular: Denies chest pain. Respiratory: Denies shortness of breath. Gastrointestinal:  abdominal pain.   nausea,  vomiting.   diarrhea.  No constipation. Genitourinary: Negative for dysuria. Musculoskeletal: Negative for back pain. Skin: Negative for rash. Neurological: Negative for headaches, focal weakness   ____________________________________________   PHYSICAL EXAM:  VITAL SIGNS: ED Triage Vitals  Enc Vitals Group     BP 12/11/19 2226 (!) 114/57     Pulse Rate 12/11/19 2226 (!) 118     Resp 12/11/19 2213 (!) 21     Temp  12/11/19 2213 (!) 101 F (38.3 C)     Temp Source 12/11/19 2213 Rectal     SpO2 12/11/19 2226 90 %     Weight --      Height --      Head Circumference --      Peak Flow --      Pain Score --      Pain Loc --      Pain Edu? --      Excl. in GC? --     Constitutional: Alert and oriented.  Very uncomfortable Eyes: Conjunctivae are normal.  Head: Atraumatic. Nose: No congestion/rhinnorhea. Mouth/Throat: Mucous membranes are moist.  Oropharynx non-erythematous. Neck: No stridor. Cardiovascular: Normal rate, regular rhythm. Grossly normal heart sounds.  Good peripheral circulation. Respiratory: Normal respiratory effort.  No retractions. Lungs CTAB. Gastrointestinal: Soft and nontender. No distention. No abdominal bruits. No CVA tenderness. Musculoskeletal: No lower extremity tenderness nor edema.   Neurologic:  Normal speech and language. No gross focal neurologic deficits are appreciated.  Skin:  Skin is warm, dry and intact. No rash noted.   ____________________________________________   LABS (all labs ordered are listed, but only abnormal results are displayed)  Labs Reviewed  COMPREHENSIVE METABOLIC PANEL - Abnormal; Notable for the following components:      Result Value   Chloride 96 (*)    Glucose, Bld 255 (*)    Total Protein 8.3 (*)    Total Bilirubin 1.4 (*)    All other components within normal limits  CBC WITH DIFFERENTIAL/PLATELET - Abnormal; Notable for the following components:   WBC 26.2 (*)    Neutro Abs 22.8 (*)    Lymphs Abs 0.6 (*)    Monocytes Absolute 2.0 (*)    Abs Immature Granulocytes 0.14 (*)    All other components within normal limits  URINALYSIS, COMPLETE (UACMP) WITH MICROSCOPIC - Abnormal; Notable for the following components:   Color, Urine YELLOW (*)    APPearance HAZY (*)    Glucose, UA >=500 (*)    Ketones, ur 20 (*)    Protein, ur 30 (*)    All other components within normal limits  URINE CULTURE  CULTURE, BLOOD (ROUTINE X 2)    CULTURE, BLOOD (ROUTINE X 2)  SARS CORONAVIRUS 2 BY RT PCR (HOSPITAL ORDER, PERFORMED IN [facility])  GASTROINTESTINAL PANEL BY PCR, STOOL (REPLACES STOOL CULTURE)  LIPASE, BLOOD  LACTIC ACID, PLASMA  LACTIC ACID, PLASMA  TROPONIN I (HIGH SENSITIVITY)  TROPONIN I (HIGH SENSITIVITY)   ____________________________________________  EKG   ____________________________________________  RADIOLOGY  ED MD interpretation:    Official radiology report(s): CT ABDOMEN PELVIS W CONTRAST  Result Date: 12/11/2019 CLINICAL DATA:  Abdominal pain fever and distension EXAM: CT ABDOMEN AND PELVIS WITH CONTRAST TECHNIQUE: Multidetector CT imaging of the abdomen and pelvis was performed using the standard protocol following bolus administration of intravenous contrast. CONTRAST:  OMNIPAQUE IOHEXOL 300 MG/ML  SOLN COMPARISON:  None. FINDINGS: Lower chest: The visualized heart size within normal limits. No pericardial fluid/thickening. No hiatal hernia. The visualized portions of the lungs are clear. Hepatobiliary: The liver is normal in density without focal abnormality.The main portal vein is patent. Layering calcified gallstones are present. Pancreas: Unremarkable. No pancreatic ductal dilatation or surrounding inflammatory changes. Spleen: Normal in size without focal abnormality. Adrenals/Urinary Tract: Both adrenal glands appear normal. Mild bilateral renal atrophy is seen. Bladder is unremarkable. Stomach/Bowel: The stomach and small bowel normal in appearance. There is a large amount of colonic stool seen throughout. Within the sigmoid rectal junction there is a large amount of stool with a rectal wall. Diffuse wall thickening is seen around the rectum with surrounding presacral fat stranding changes. There is also wall thickening seen around the anal canal. Vascular/Lymphatic: There are no enlarged mesenteric, retroperitoneal, or pelvic lymph nodes. Scattered aortic atherosclerotic  calcifications are seen without aneurysmal dilatation. Reproductive: The uterus and adnexa are unremarkable. Other: No evidence of abdominal wall mass or hernia. Musculoskeletal: No acute or significant osseous findings. IMPRESSION: 1. Dilated sigmoid colon and rectum with a large amount of impacted with findings which could be suggestive of proctocolitis. However would recommend direct visualization upon resolution of symptoms to exclude underlying mass lesion. 2.  Aortic Atherosclerosis (ICD10-I70.0). Electronically  Signed   By: Jonna Clark M.D.   On: 12/11/2019 23:49   DG Chest Portable 1 View  Result Date: 12/12/2019 CLINICAL DATA:  Hypoxia EXAM: PORTABLE CHEST 1 VIEW COMPARISON:  03/08/2016 FINDINGS: Low lung volumes. Mild cardiomegaly. No consolidation or effusion. No pneumothorax. IMPRESSION: Low lung volumes with mild cardiomegaly. Electronically Signed   By: Jasmine Pang M.D.   On: 12/12/2019 00:02    ____________________________________________   PROCEDURES  Procedure(s) performed (including Critical Care):  Procedures   ____________________________________________   INITIAL IMPRESSION / ASSESSMENT AND PLAN / ED COURSE  Discussed patient CT with radiology at [facility].  CT is concerning for stercoral colitis.  We reviewed the upper part of the CT the ascending aorta radiologist not too concerned about that he said there is a lot of motion artifact there.  Patient does not have any chest complaints at this time.  She however does have a fever with an extremely high white blood count.  We will have to get her in and hopefully with enemas or something else disimpact her.  She does meet sepsis criteria and is at high risk for worsening including possible perforation.              ____________________________________________   FINAL CLINICAL IMPRESSION(S) / ED DIAGNOSES  Final diagnoses:  Sepsis, due to unspecified organism, unspecified whether acute organ dysfunction present  [facility])  Abdominal pain, unspecified abdominal location     ED Discharge Orders    None      *Please note:  SUKAINA TOOTHAKER was evaluated in Emergency Department on 12/12/2019 for the symptoms described in the history of present illness. She was evaluated in the context of the global COVID-19 pandemic, which necessitated consideration that the patient might be at risk for infection with the SARS-CoV-2 virus that causes COVID-19. Institutional protocols and algorithms that pertain to the evaluation of patients at risk for COVID-19 are in a state of rapid change based on information released by regulatory bodies including the CDC and federal and state organizations. These policies and algorithms were followed during the patient's care in the ED.  Some ED evaluations and interventions may be delayed as a result of limited staffing during and the pandemic.*   Note:  This document was prepared using Dragon voice recognition software and may include unintentional dictation errors.    Arnaldo Natal, MD 12/12/19 [phone]

## 2019-12-11 NOTE — ED Triage Notes (Signed)
Pt is coming in from [facility] by EMS for not having a bowel movement in several days. As per EMS pt got 2 enemas PTA, 4mg  of IV zofran and the facility gave 0.4 of clonidine. Pt had blood sugar of 260. Pt has history of COPD and stroke with deficits to the left side.  Pt has dementia and RN is unable to complete all triage due to dementia.

## 2019-12-12 DIAGNOSIS — I69354 Hemiplegia and hemiparesis following cerebral infarction affecting left non-dominant side: Secondary | ICD-10-CM | POA: Diagnosis not present

## 2019-12-12 DIAGNOSIS — R6520 Severe sepsis without septic shock: Secondary | ICD-10-CM | POA: Diagnosis present

## 2019-12-12 DIAGNOSIS — Z83438 Family history of other disorder of lipoprotein metabolism and other lipidemia: Secondary | ICD-10-CM | POA: Diagnosis not present

## 2019-12-12 DIAGNOSIS — I639 Cerebral infarction, unspecified: Secondary | ICD-10-CM | POA: Diagnosis not present

## 2019-12-12 DIAGNOSIS — Z794 Long term (current) use of insulin: Secondary | ICD-10-CM

## 2019-12-12 DIAGNOSIS — E119 Type 2 diabetes mellitus without complications: Secondary | ICD-10-CM

## 2019-12-12 DIAGNOSIS — K5130 Ulcerative (chronic) rectosigmoiditis without complications: Secondary | ICD-10-CM | POA: Diagnosis present

## 2019-12-12 DIAGNOSIS — K529 Noninfective gastroenteritis and colitis, unspecified: Secondary | ICD-10-CM | POA: Diagnosis present

## 2019-12-12 DIAGNOSIS — G8194 Hemiplegia, unspecified affecting left nondominant side: Secondary | ICD-10-CM | POA: Diagnosis not present

## 2019-12-12 DIAGNOSIS — A4151 Sepsis due to Escherichia coli [E. coli]: Secondary | ICD-10-CM | POA: Diagnosis present

## 2019-12-12 DIAGNOSIS — Z7902 Long term (current) use of antithrombotics/antiplatelets: Secondary | ICD-10-CM | POA: Diagnosis not present

## 2019-12-12 DIAGNOSIS — K5909 Other constipation: Secondary | ICD-10-CM | POA: Diagnosis present

## 2019-12-12 DIAGNOSIS — A419 Sepsis, unspecified organism: Secondary | ICD-10-CM | POA: Diagnosis not present

## 2019-12-12 DIAGNOSIS — Z89422 Acquired absence of other left toe(s): Secondary | ICD-10-CM | POA: Diagnosis not present

## 2019-12-12 DIAGNOSIS — I700 Atherosclerosis of aorta: Secondary | ICD-10-CM | POA: Diagnosis present

## 2019-12-12 DIAGNOSIS — A0472 Enterocolitis due to Clostridium difficile, not specified as recurrent: Secondary | ICD-10-CM | POA: Diagnosis present

## 2019-12-12 DIAGNOSIS — A041 Enterotoxigenic Escherichia coli infection: Secondary | ICD-10-CM

## 2019-12-12 DIAGNOSIS — E785 Hyperlipidemia, unspecified: Secondary | ICD-10-CM | POA: Diagnosis present

## 2019-12-12 DIAGNOSIS — Z803 Family history of malignant neoplasm of breast: Secondary | ICD-10-CM | POA: Diagnosis not present

## 2019-12-12 DIAGNOSIS — R4781 Slurred speech: Secondary | ICD-10-CM | POA: Diagnosis not present

## 2019-12-12 DIAGNOSIS — I10 Essential (primary) hypertension: Secondary | ICD-10-CM | POA: Diagnosis present

## 2019-12-12 DIAGNOSIS — Z79899 Other long term (current) drug therapy: Secondary | ICD-10-CM | POA: Diagnosis not present

## 2019-12-12 DIAGNOSIS — R5381 Other malaise: Secondary | ICD-10-CM | POA: Diagnosis present

## 2019-12-12 DIAGNOSIS — Z87891 Personal history of nicotine dependence: Secondary | ICD-10-CM | POA: Diagnosis not present

## 2019-12-12 DIAGNOSIS — Z823 Family history of stroke: Secondary | ICD-10-CM | POA: Diagnosis not present

## 2019-12-12 DIAGNOSIS — Z7982 Long term (current) use of aspirin: Secondary | ICD-10-CM | POA: Diagnosis not present

## 2019-12-12 DIAGNOSIS — Z20822 Contact with and (suspected) exposure to covid-19: Secondary | ICD-10-CM | POA: Diagnosis present

## 2019-12-12 DIAGNOSIS — I959 Hypotension, unspecified: Secondary | ICD-10-CM | POA: Diagnosis not present

## 2019-12-12 DIAGNOSIS — R109 Unspecified abdominal pain: Secondary | ICD-10-CM | POA: Diagnosis not present

## 2019-12-12 DIAGNOSIS — J449 Chronic obstructive pulmonary disease, unspecified: Secondary | ICD-10-CM | POA: Diagnosis present

## 2019-12-12 LAB — CBC WITH DIFFERENTIAL/PLATELET
Abs Immature Granulocytes: 0.14 10*3/uL — ABNORMAL HIGH (ref 0.00–0.07)
Basophils Absolute: 0.1 10*3/uL (ref 0.0–0.1)
Basophils Relative: 0 %
Eosinophils Absolute: 0 10*3/uL (ref 0.0–0.5)
Eosinophils Relative: 0 %
HCT: 42.1 % (ref 36.0–46.0)
Hemoglobin: 13.7 g/dL (ref 12.0–15.0)
Immature Granulocytes: 1 %
Lymphocytes Relative: 2 %
Lymphs Abs: 0.6 10*3/uL — ABNORMAL LOW (ref 0.7–4.0)
MCH: 28.7 pg (ref 26.0–34.0)
MCHC: 32.5 g/dL (ref 30.0–36.0)
MCV: 88.1 fL (ref 80.0–100.0)
Monocytes Absolute: 2 10*3/uL — ABNORMAL HIGH (ref 0.1–1.0)
Monocytes Relative: 8 %
Neutro Abs: 22.8 10*3/uL — ABNORMAL HIGH (ref 1.7–7.7)
Neutrophils Relative %: 89 %
Platelets: 321 10*3/uL (ref 150–400)
RBC: 4.78 MIL/uL (ref 3.87–5.11)
RDW: 15.3 % (ref 11.5–15.5)
WBC: 26.2 10*3/uL — ABNORMAL HIGH (ref 4.0–10.5)
nRBC: 0 % (ref 0.0–0.2)

## 2019-12-12 LAB — GASTROINTESTINAL PANEL BY PCR, STOOL (REPLACES STOOL CULTURE)

## 2019-12-12 LAB — CORTISOL-AM, BLOOD: Cortisol - AM: 33.6 ug/dL — ABNORMAL HIGH (ref 6.7–22.6)

## 2019-12-12 LAB — PROTIME-INR
INR: 1.1 (ref 0.8–1.2)
Prothrombin Time: 13.3 seconds (ref 11.4–15.2)

## 2019-12-12 LAB — HEMOGLOBIN A1C
Hgb A1c MFr Bld: 8.2 % — ABNORMAL HIGH (ref 4.8–5.6)
Mean Plasma Glucose: 188.64 mg/dL

## 2019-12-12 LAB — BASIC METABOLIC PANEL
Anion gap: 12 (ref 5–15)
BUN: 23 mg/dL (ref 8–23)
CO2: 29 mmol/L (ref 22–32)
Calcium: 8.9 mg/dL (ref 8.9–10.3)
Chloride: 98 mmol/L (ref 98–111)
Creatinine, Ser: 0.98 mg/dL (ref 0.44–1.00)
GFR calc Af Amer: >60 mL/min (ref >60)
GFR calc non Af Amer: 57 mL/min — ABNORMAL LOW (ref >60)
Glucose, Bld: 292 mg/dL — ABNORMAL HIGH (ref 70–99)
Potassium: 4.6 mmol/L (ref 3.5–5.1)
Sodium: 139 mmol/L (ref 135–145)

## 2019-12-12 LAB — TROPONIN I (HIGH SENSITIVITY): Troponin I (High Sensitivity): 8 ng/L (ref <18)

## 2019-12-12 LAB — GLUCOSE, CAPILLARY
Glucose-Capillary: 199 mg/dL — ABNORMAL HIGH (ref 70–99)
Glucose-Capillary: 194 mg/dL — ABNORMAL HIGH (ref 70–99)

## 2019-12-12 LAB — CBC
HCT: 40.2 % (ref 36.0–46.0)
Hemoglobin: 12.7 g/dL (ref 12.0–15.0)
MCH: 28.9 pg (ref 26.0–34.0)
MCHC: 31.6 g/dL (ref 30.0–36.0)
MCV: 91.6 fL (ref 80.0–100.0)
Platelets: 298 10*3/uL (ref 150–400)
RBC: 4.39 MIL/uL (ref 3.87–5.11)
RDW: 15.3 % (ref 11.5–15.5)
WBC: 23.5 10*3/uL — ABNORMAL HIGH (ref 4.0–10.5)
nRBC: 0 % (ref 0.0–0.2)

## 2019-12-12 LAB — SARS CORONAVIRUS 2 BY RT PCR (HOSPITAL ORDER, PERFORMED IN ~~LOC~~ HOSPITAL LAB): SARS Coronavirus 2: NEGATIVE

## 2019-12-12 LAB — LACTIC ACID, PLASMA: Lactic Acid, Venous: 1.1 mmol/L (ref 0.5–1.9)

## 2019-12-12 LAB — PROCALCITONIN: Procalcitonin: 7.51 ng/mL

## 2019-12-12 MED ORDER — MAGNESIUM OXIDE 400 MG PO TABS
400.0000 mg | ORAL_TABLET | Freq: Two times a day (BID) | ORAL | Status: DC
  Administered 2019-12-12 – 2019-12-14 (×4): 400 mg via ORAL
  Filled 2019-12-12 (×9): qty 1

## 2019-12-12 MED ORDER — TRAZODONE HCL 50 MG PO TABS: 25.0000 mg | ORAL_TABLET | Freq: Every evening | ORAL | Status: DC | PRN

## 2019-12-12 MED ORDER — CANAGLIFLOZIN 100 MG PO TABS
200.0000 mg | ORAL_TABLET | Freq: Every day | ORAL | Status: DC
  Administered 2019-12-13 – 2019-12-18 (×5): 200 mg via ORAL
  Filled 2019-12-12 (×9): qty 2

## 2019-12-12 MED ORDER — VITAMIN D 25 MCG (1000 UNIT) PO TABS
1000.0000 [IU] | ORAL_TABLET | Freq: Every day | ORAL | Status: DC
  Administered 2019-12-13 – 2019-12-18 (×6): 1000 [IU] via ORAL
  Filled 2019-12-12 (×6): qty 1

## 2019-12-12 MED ORDER — METRONIDAZOLE IN NACL 5-0.79 MG/ML-% IV SOLN
500.0000 mg | Freq: Three times a day (TID) | INTRAVENOUS | Status: DC
  Administered 2019-12-12 – 2019-12-14 (×7): 500 mg via INTRAVENOUS
  Filled 2019-12-12 (×9): qty 100

## 2019-12-12 MED ORDER — ASPIRIN EC 81 MG PO TBEC
81.0000 mg | DELAYED_RELEASE_TABLET | Freq: Every day | ORAL | Status: DC
  Administered 2019-12-13 – 2019-12-18 (×6): 81 mg via ORAL
  Filled 2019-12-12 (×7): qty 1

## 2019-12-12 MED ORDER — ENOXAPARIN SODIUM 40 MG/0.4ML ~~LOC~~ SOLN
40.0000 mg | SUBCUTANEOUS | Status: DC
  Administered 2019-12-13 – 2019-12-17 (×5): 40 mg via SUBCUTANEOUS
  Filled 2019-12-12 (×6): qty 0.4

## 2019-12-12 MED ORDER — INSULIN ASPART 100 UNIT/ML ~~LOC~~ SOLN
0.0000 [IU] | Freq: Every day | SUBCUTANEOUS | Status: DC
  Administered 2019-12-14 – 2019-12-15 (×2): 2 [IU] via SUBCUTANEOUS
  Filled 2019-12-12 (×3): qty 1

## 2019-12-12 MED ORDER — POLYETHYLENE GLYCOL 3350 17 G PO PACK: 17.0000 g | PACK | Freq: Every day | ORAL | Status: DC

## 2019-12-12 MED ORDER — CLOPIDOGREL BISULFATE 75 MG PO TABS
75.0000 mg | ORAL_TABLET | Freq: Every day | ORAL | Status: DC
  Administered 2019-12-13 – 2019-12-18 (×6): 75 mg via ORAL
  Filled 2019-12-12 (×7): qty 1

## 2019-12-12 MED ORDER — ATORVASTATIN CALCIUM 20 MG PO TABS
20.0000 mg | ORAL_TABLET | Freq: Every day | ORAL | Status: DC
  Administered 2019-12-12 – 2019-12-17 (×6): 20 mg via ORAL
  Filled 2019-12-12 (×6): qty 1

## 2019-12-12 MED ORDER — TRAZODONE HCL 50 MG PO TABS
50.0000 mg | ORAL_TABLET | Freq: Every day | ORAL | Status: DC
  Administered 2019-12-12 – 2019-12-17 (×5): 50 mg via ORAL
  Filled 2019-12-12 (×5): qty 1

## 2019-12-12 MED ORDER — ACETAMINOPHEN 325 MG PO TABS: 650.0000 mg | ORAL_TABLET | Freq: Four times a day (QID) | ORAL | Status: DC | PRN

## 2019-12-12 MED ORDER — ALBUTEROL SULFATE (2.5 MG/3ML) 0.083% IN NEBU: 3.0000 mL | INHALATION_SOLUTION | RESPIRATORY_TRACT | Status: DC | PRN

## 2019-12-12 MED ORDER — LINAGLIPTIN 5 MG PO TABS
5.0000 mg | ORAL_TABLET | Freq: Every day | ORAL | Status: DC
  Administered 2019-12-13 – 2019-12-18 (×6): 5 mg via ORAL
  Filled 2019-12-12 (×9): qty 1

## 2019-12-12 MED ORDER — SODIUM CHLORIDE 0.9 % IV SOLN
2.0000 g | Freq: Two times a day (BID) | INTRAVENOUS | Status: DC
  Administered 2019-12-12 (×2): 2 g via INTRAVENOUS
  Filled 2019-12-12 (×4): qty 2

## 2019-12-12 MED ORDER — POLYETHYLENE GLYCOL 3350 17 G PO PACK: 17.0000 g | PACK | Freq: Every day | ORAL | Status: DC | PRN

## 2019-12-12 MED ORDER — ONDANSETRON HCL 4 MG/2ML IJ SOLN: 4.0000 mg | Freq: Four times a day (QID) | INTRAMUSCULAR | Status: DC | PRN

## 2019-12-12 MED ORDER — INSULIN ASPART 100 UNIT/ML ~~LOC~~ SOLN
0.0000 [IU] | Freq: Three times a day (TID) | SUBCUTANEOUS | Status: DC
  Administered 2019-12-12: 3 [IU] via SUBCUTANEOUS
  Administered 2019-12-13: 7 [IU] via SUBCUTANEOUS
  Administered 2019-12-13: 4 [IU] via SUBCUTANEOUS
  Administered 2019-12-13 – 2019-12-14 (×2): 7 [IU] via SUBCUTANEOUS
  Administered 2019-12-14: 4 [IU] via SUBCUTANEOUS
  Administered 2019-12-14 – 2019-12-15 (×3): 7 [IU] via SUBCUTANEOUS
  Administered 2019-12-15: 3 [IU] via SUBCUTANEOUS
  Administered 2019-12-16 – 2019-12-17 (×5): 4 [IU] via SUBCUTANEOUS
  Administered 2019-12-17 – 2019-12-18 (×2): 3 [IU] via SUBCUTANEOUS
  Administered 2019-12-18: 7 [IU] via SUBCUTANEOUS
  Filled 2019-12-12 (×18): qty 1

## 2019-12-12 MED ORDER — ONDANSETRON HCL 4 MG PO TABS: 4.0000 mg | ORAL_TABLET | Freq: Four times a day (QID) | ORAL | Status: DC | PRN

## 2019-12-12 MED ORDER — DICLOFENAC SODIUM 1 % TD GEL
2.0000 g | Freq: Four times a day (QID) | TRANSDERMAL | Status: DC
  Administered 2019-12-12 – 2019-12-18 (×23): 2 g via TOPICAL
  Filled 2019-12-12: qty 100

## 2019-12-12 MED ORDER — ACETAMINOPHEN 500 MG PO TABS
1000.0000 mg | ORAL_TABLET | Freq: Four times a day (QID) | ORAL | Status: DC | PRN
  Administered 2019-12-13 – 2019-12-16 (×4): 1000 mg via ORAL
  Filled 2019-12-12 (×4): qty 2

## 2019-12-12 MED ORDER — SENNOSIDES-DOCUSATE SODIUM 8.6-50 MG PO TABS
1.0000 | ORAL_TABLET | Freq: Every day | ORAL | Status: DC
  Administered 2019-12-12 – 2019-12-13 (×2): 1 via ORAL
  Filled 2019-12-12 (×2): qty 1

## 2019-12-12 MED ORDER — SODIUM CHLORIDE 0.9 % IV SOLN: INTRAVENOUS | Status: DC

## 2019-12-12 MED ORDER — ACETAMINOPHEN 650 MG RE SUPP: 650.0000 mg | Freq: Four times a day (QID) | RECTAL | Status: DC | PRN

## 2019-12-12 MED ORDER — ACETAMINOPHEN 500 MG PO TABS
ORAL_TABLET | ORAL | Status: AC
  Administered 2019-12-12: 1000 mg via ORAL
  Filled 2019-12-12: qty 2

## 2019-12-12 MED ORDER — INSULIN ASPART PROT & ASPART (70-30 MIX) 100 UNIT/ML PEN: 90.0000 [IU] | PEN_INJECTOR | Freq: Two times a day (BID) | SUBCUTANEOUS | Status: DC

## 2019-12-12 NOTE — ED Notes (Signed)
Pt had Bowel movement and clogged purewick. purewick removed. Pt cleaned and new chuxs placed under pt. Barrier cream placed on pt buttock

## 2019-12-12 NOTE — ED Notes (Signed)
Dr. Arville Care notified that pt tested positive for Enteropathogenic E coli

## 2019-12-12 NOTE — ED Notes (Signed)
Pt cleaned and bed sheets changed at this time. Pt placed in new gown as well. Husband at bedside at this time

## 2019-12-12 NOTE — ED Notes (Signed)
Pt cleaned by this RN and tracey, NT. Pt with constant loose stool at this time. Skin irritation noted to bottom, barrier cream applied by this RN. MD [facility] notified, awaiting any new orders at this time.

## 2019-12-12 NOTE — ED Notes (Signed)
Pt had another bowel movement, loose, and urine movement on bed. Pt cleaned, new linen, chux and gown provided.

## 2019-12-12 NOTE — Progress Notes (Signed)
Care started prior to midnight in the emergency room patient was admitted early this morning after midnight and I am in current agreement with assessment and plan done by Dr. Valente David.  Additional changes to the plan of care have been made accordingly.  Obese 74 year old Caucasian female with a past medical history significant for but not limited to hypertension, hyperlipidemia, type 2 diabetes mellitus, COPD, as well as anxiety and her comorbidities who presented with a chief complaint of left lower quadrant abdominal pain associate with constipation without nausea or vomiting.  She denies any fevers or chills at home but was found to be febrile in the emergency room with a procalcitonin level 7.51.  She did have a leukocytosis and abdominal pelvic CT scan showed a dilated sigmoid colon and rectum with large amount of impacted stools findings suggesting a proctocolitis.  She is started on MiraLAX 17 g p.o. daily blood and urine cultures were obtained and her stools were sent for testing and grew out enterotoxigenic E. coli.  Currently she is being admitted for sepsis secondary to acute proctocolitis from enterotoxigenic E. coli and she is admitted to the medical floor and started on antibiotics with IV cefepime and IV Flagyl.  Is currently getting IV fluid hydration with normal saline at 100 mL per hour.  Procalcitonin level was elevated as above.  She started having very frequent diarrhea Flexi-Seal was placed and because her buttocks was already starting to show signs of skin irritation and breakdown.  WBC is slowly trended down from 26.2 and is now 23.5.  Troponin was flat and lactic acid level was within normal limits.  Of note she no longer takes NovoLog 70/30 so this is being discontinued of the morning we will continue Invokana and Tradjenta and also start the patient on a resistant NovoLog sliding scale insulin AC.  We will continue her aspirin, her Plavix and her statin for her history of CVA.  We will  continue to monitor the patient's clinical response to intervention and repeat blood work and imaging in the a.m. and repeat KUB in the morning.  Of note at the time of my examination the sepsis physiology is improved and she is no longer febrile, tachycardic but remains a little tachypneic.   Status is: Inpatient  Remains inpatient appropriate because:Inpatient level of care appropriate due to severity of illness   Dispo: The patient is from: Home              Anticipated d/c is to: TBD              Anticipated d/c date is: 2 days              Patient currently is not medically stable to d/c.

## 2019-12-12 NOTE — ED Notes (Signed)
Pt had urinated and had BM in bed. Pt cleaned, old sheets removed and and new sheets put on. Pt placed in new gown with pt hooked up to purewick.

## 2019-12-12 NOTE — H&P (Addendum)
[place]   PATIENT NAME: Molly [name]    MR#:  [id number]  DATE OF BIRTH:  1946-01-16  DATE OF ADMISSION:  12/12/2019  PRIMARY CARE PHYSICIAN: Ethelda Chick, MD   REQUESTING/REFERRING PHYSICIAN: Dorothea Glassman, MD  CHIEF COMPLAINT:   Chief Complaint  Patient presents with  . Constipation    HISTORY OF PRESENT ILLNESS:  Molly [name]  is a 74 y.o. female with a known history of COPD, type 2 diabetes mellitus, hypertension, dyslipidemia and anxiety, who presented to the emergency room with acute onset of left lower quadrant as well as mid lower abdominal pain with associated constipation without nausea or vomiting or diarrhea.  She had no fever or chills at home.  No chest pain or dyspnea or cough or wheezing.  No dysuria, oliguria or hematuria or flank pain.  Upon presentation to the emergency room, temperature was 101.7 with a heart rate of 118 and pulse symmetry of 89% on room air 90% on 2 L of O2 by nasal cannula with a blood pressure 114/57 and respiratory rate of 19.  Labs revealed a blood glucose of 90-92 and procalcitonin of 7.51 with lactic acid 1.1 and high-sensitivity troponin I of 8.  CBC shows significant leukocytosis 23.5.  COVID-19 PCR came back negative. Abdominal pelvic CT scan showed dilated sigmoid colon and rectum with large amount of impacted stools with findings suggestive of proctocolitis.  She had also showed aortic atherosclerosis.  The patient had blood and urine cultures sent.  His stools grew enterotoxigenic E. coli.  L  The patient was given IV cefepime and Tylenol.  She will be admitted to a medically monitored bed for further evaluation and management. PAST MEDICAL HISTORY:   Past Medical History:  Diagnosis Date  . Anxiety   . COPD (chronic obstructive pulmonary disease) (HCC)   . Diabetes (HCC)   . Hemorrhoid   . HLD (hyperlipidemia)   . HTN (hypertension)   . Stroke [facility]) 2008   left weakness    PAST SURGICAL HISTORY:   Past  Surgical History:  Procedure Laterality Date  . AMPUTATION Left 05/22/2014   Procedure: AMPUTATION RAY LEFT FOURTH AND FIFTH ;  Surgeon: Toni Arthurs, MD;  Location: [facility] OR;  Service: Orthopedics;  Laterality: Left;  . CATARACT EXTRACTION    . COLONOSCOPY  2004  . COLONOSCOPY WITH PROPOFOL N/A 04/29/2016   Procedure: COLONOSCOPY WITH PROPOFOL;  Surgeon: Wyline Mood, MD;  Location: [facility] ENDOSCOPY;  Service: Gastroenterology;  Laterality: N/A;  . ESOPHAGOGASTRODUODENOSCOPY (EGD) WITH PROPOFOL N/A 04/27/2016   Procedure: ESOPHAGOGASTRODUODENOSCOPY (EGD) WITH PROPOFOL;  Surgeon: Midge Minium, MD;  Location: [facility] ENDOSCOPY;  Service: Endoscopy;  Laterality: N/A;    SOCIAL HISTORY:   Social History   Tobacco Use  . Smoking status: Former Smoker    Packs/day: 0.50    Years: 2.00    Pack years: 1.00    Types: Cigarettes    Quit date: 11/18/1968    Years since quitting: 51.0  . Smokeless tobacco: Never Used  Substance Use Topics  . Alcohol use: No    Alcohol/week: 0.0 standard drinks    Comment: seldom    FAMILY HISTORY:   Family History  Problem Relation Age of Onset  . Hyperlipidemia Mother   . Varicose Veins Mother   . Deep vein thrombosis Father   . Stroke Father   . Breast cancer Paternal Aunt   . Stroke Maternal Grandmother   . Stroke Paternal Grandmother     DRUG  ALLERGIES:  No Known Allergies  REVIEW OF SYSTEMS:   ROS As per history of present illness. All pertinent systems were reviewed above. Constitutional, HEENT, cardiovascular, respiratory, GI, GU, musculoskeletal, neuro, psychiatric, endocrine, integumentary and hematologic systems were reviewed and are otherwise negative/unremarkable except for positive findings mentioned above in the HPI.   MEDICATIONS AT HOME:   Prior to Admission medications   Medication Sig Start Date End Date Taking? Authorizing Provider  acetaminophen (TYLENOL) 325 MG tablet Take 650 mg by mouth every 6 (six) hours as needed.   Yes  [provider]  albuterol (PROVENTIL HFA;VENTOLIN HFA) 108 (90 Base) MCG/ACT inhaler ALBUTEROL 90 MCG/ACT AERS 05/20/09  Yes [provider]  amitriptyline (ELAVIL) 75 MG tablet Take 75 mg by mouth at bedtime.    Yes [provider]  aspirin EC 81 MG tablet Take 81 mg by mouth daily.  02/28/09  Yes [provider]  atorvastatin (LIPITOR) 20 MG tablet Take 20 mg by mouth daily at 6 PM.  12/07/19  Yes [provider]  B Complex-C (SUPER B COMPLEX PO) Take 1 tablet by mouth daily.    Yes [provider]  canagliflozin (INVOKANA) 100 MG TABS tablet Take 200 mg by mouth daily before breakfast.    Yes [provider]  Cholecalciferol (VITAMIN D3) 1000 UNITS CAPS Take 1,000 Units by mouth daily.   Yes [provider]  clopidogrel (PLAVIX) 75 MG tablet Take 75 mg by mouth.   Yes [provider]  diclofenac sodium (VOLTAREN) 1 % GEL Apply 2 g topically 4 (four) times daily. To each shoulders   Yes [provider]  magnesium oxide (MAG-OX) 400 MG tablet Take 400 mg by mouth 2 (two) times daily.   Yes [provider]  metFORMIN (GLUCOPHAGE-XR) 500 MG 24 hr tablet Take 500 mg by mouth 2 (two) times daily. 09/05/19  Yes [provider]  polyethylene glycol (MIRALAX / GLYCOLAX) packet Take 17 g by mouth daily.   Yes [provider]  sitaGLIPtin (JANUVIA) 100 MG tablet Take 100 mg by mouth daily.   Yes [provider]  traZODone (DESYREL) 50 MG tablet Take 50 mg by mouth at bedtime.  09/11/19  Yes [provider]  insulin aspart protamine - aspart (NOVOLOG 70/30 MIX) (70-30) 100 UNIT/ML FlexPen Inject 90 Units into the skin 2 (two) times daily.    [provider]  senna-docusate (SENOKOT-S) 8.6-50 MG tablet Take 1 tablet by mouth at bedtime.    [provider]      VITAL SIGNS:  Blood pressure 140/77, pulse (!) 111, temperature 98.7 F (37.1 C), temperature source  Oral, resp. rate 18, SpO2 95 %.  PHYSICAL EXAMINATION:  Physical Exam  GENERAL:  74 y.o.-year-old Caucasian female patient lying in the bed with no acute distress.  EYES: Pupils equal, round, reactive to light and accommodation. No scleral icterus. Extraocular muscles intact.  HEENT: Head atraumatic, normocephalic. Oropharynx and nasopharynx clear.  NECK:  Supple, no jugular venous distention. No thyroid enlargement, no tenderness.  LUNGS: Normal breath sounds bilaterally, no wheezing, rales,rhonchi or crepitation. No use of accessory muscles of respiration.  CARDIOVASCULAR: Regular rate and rhythm, S1, S2 normal. No murmurs, rubs, or gallops.  ABDOMEN: Soft, nondistended with suprapubic and left lower quadrant tenderness without rebound tenderness guarding or rigidity.  Bowel sounds present. No organomegaly or mass.  EXTREMITIES: No pedal edema, cyanosis, or clubbing.  NEUROLOGIC: Cranial nerves II through XII are intact. Muscle strength 5/5 in all  extremities. Sensation intact. Gait not checked.  PSYCHIATRIC: The patient is alert and oriented x 3.  Normal affect and good eye contact. SKIN: No obvious rash, lesion, or ulcer.   LABORATORY PANEL:   CBC Recent Labs  Lab 12/11/19 2211  WBC 26.2*  HGB 13.7  HCT 42.1  PLT 321   ------------------------------------------------------------------------------------------------------------------  Chemistries  Recent Labs  Lab 12/11/19 2211  NA 137  K 4.3  CL 96*  CO2 27  GLUCOSE 255*  BUN 21  CREATININE 0.87  CALCIUM 9.1  AST 22  ALT 30  ALKPHOS 68  BILITOT 1.4*   ------------------------------------------------------------------------------------------------------------------  Cardiac Enzymes No results for input(s): TROPONINI in the last 168 hours. ------------------------------------------------------------------------------------------------------------------  RADIOLOGY:  CT ABDOMEN PELVIS W CONTRAST  Result Date:  12/11/2019 CLINICAL DATA:  Abdominal pain fever and distension EXAM: CT ABDOMEN AND PELVIS WITH CONTRAST TECHNIQUE: Multidetector CT imaging of the abdomen and pelvis was performed using the standard protocol following bolus administration of intravenous contrast. CONTRAST:  OMNIPAQUE IOHEXOL 300 MG/ML  SOLN COMPARISON:  None. FINDINGS: Lower chest: The visualized heart size within normal limits. No pericardial fluid/thickening. No hiatal hernia. The visualized portions of the lungs are clear. Hepatobiliary: The liver is normal in density without focal abnormality.The main portal vein is patent. Layering calcified gallstones are present. Pancreas: Unremarkable. No pancreatic ductal dilatation or surrounding inflammatory changes. Spleen: Normal in size without focal abnormality. Adrenals/Urinary Tract: Both adrenal glands appear normal. Mild bilateral renal atrophy is seen. Bladder is unremarkable. Stomach/Bowel: The stomach and small bowel normal in appearance. There is a large amount of colonic stool seen throughout. Within the sigmoid rectal junction there is a large amount of stool with a rectal wall. Diffuse wall thickening is seen around the rectum with surrounding presacral fat stranding changes. There is also wall thickening seen around the anal canal. Vascular/Lymphatic: There are no enlarged mesenteric, retroperitoneal, or pelvic lymph nodes. Scattered aortic atherosclerotic calcifications are seen without aneurysmal dilatation. Reproductive: The uterus and adnexa are unremarkable. Other: No evidence of abdominal wall mass or hernia. Musculoskeletal: No acute or significant osseous findings. IMPRESSION: 1. Dilated sigmoid colon and rectum with a large amount of impacted with findings which could be suggestive of proctocolitis. However would recommend direct visualization upon resolution of symptoms to exclude underlying mass lesion. 2.  Aortic Atherosclerosis (ICD10-I70.0). Electronically Signed   By:  Jonna Clark M.D.   On: 12/11/2019 23:49   DG Chest Portable 1 View  Result Date: 12/12/2019 CLINICAL DATA:  Hypoxia EXAM: PORTABLE CHEST 1 VIEW COMPARISON:  03/08/2016 FINDINGS: Low lung volumes. Mild cardiomegaly. No consolidation or effusion. No pneumothorax. IMPRESSION: Low lung volumes with mild cardiomegaly. Electronically Signed   By: Jasmine Pang M.D.   On: 12/12/2019 00:02      IMPRESSION AND PLAN:   1.  Acute proctocolitis secondary to enterotoxigenic E. coli. -Patient will be admitted to a medical monitored bed. -We will continue antibiotic therapy with IV cefepime and Flagyl. -We will hydrate with IV normal saline. -We will follow significantly elevated CBC. -Contact precautions will be followed.  2.  Sepsis without severe sepsis or septic shock secondary to acute proctocolitis.  This is manifested by significant leuko-cytosis as well as tachycardia. -Management as above. -We will follow blood and urine cultures. -We will follow procalcitonin level.  3.  Type 2 diabetes mellitus. -The patient will be placed on supplement coverage with NovoLog and continue his basal coverage.  -We will continue Januvia.  4.  Dyslipidemia. -Statin therapy  will be continued.  5.  DVT prophylaxis. -Subcutaneous Lovenox   All the records are reviewed and case discussed with ED provider. The plan of care was discussed in details with the patient (and family). I answered all questions. The patient agreed to proceed with the above mentioned plan. Further management will depend upon hospital course.   CODE STATUS: Full code  Status is: Inpatient  Remains inpatient appropriate because:Ongoing active pain requiring inpatient pain management, Ongoing diagnostic testing needed not appropriate for outpatient work up, Unsafe d/c plan, IV treatments appropriate due to intensity of illness or inability to take PO and Inpatient level of care appropriate due to severity of illness   Dispo: The  patient is from: Home              Anticipated d/c is to: Home              Anticipated d/c date is: 3 days              Patient currently is not medically stable to d/c.   TOTAL TIME TAKING CARE OF THIS PATIENT: 55 minutes.    Hannah Beat M.D on 12/12/2019 at 2:52 AM  [facility]   From 7 PM-7 AM, contact night-coverage [email or web address]  CC: Primary care physician; Ethelda Chick, MD   Note: This dictation was prepared with Dragon dictation along with smaller phrase technology. Any transcriptional typo errors that result from this process are unintentional.

## 2019-12-12 NOTE — ED Notes (Signed)
Pt set up and meal tray given to patient at this time.

## 2019-12-12 NOTE — ED Notes (Signed)
Pt O2 turned down to 2 lpm.

## 2019-12-13 DIAGNOSIS — R109 Unspecified abdominal pain: Secondary | ICD-10-CM

## 2019-12-13 LAB — CBC WITH DIFFERENTIAL/PLATELET
Abs Immature Granulocytes: 0.07 10*3/uL (ref 0.00–0.07)
Abs Immature Granulocytes: 0.06 10*3/uL (ref 0.00–0.07)
Basophils Absolute: 0.1 10*3/uL (ref 0.0–0.1)
Basophils Absolute: 0.1 10*3/uL (ref 0.0–0.1)
Basophils Relative: 1 %
Basophils Relative: 1 %
Eosinophils Absolute: 0 10*3/uL (ref 0.0–0.5)
Eosinophils Absolute: 0 10*3/uL (ref 0.0–0.5)
Eosinophils Relative: 0 %
Eosinophils Relative: 0 %
HCT: 38.6 % (ref 36.0–46.0)
HCT: 38.5 % (ref 36.0–46.0)
Hemoglobin: 12.1 g/dL (ref 12.0–15.0)
Hemoglobin: 11.8 g/dL — ABNORMAL LOW (ref 12.0–15.0)
Immature Granulocytes: 1 %
Immature Granulocytes: 1 %
Lymphocytes Relative: 5 %
Lymphocytes Relative: 8 %
Lymphs Abs: 0.8 10*3/uL (ref 0.7–4.0)
Lymphs Abs: 1.1 10*3/uL (ref 0.7–4.0)
MCH: 28.9 pg (ref 26.0–34.0)
MCH: 28.6 pg (ref 26.0–34.0)
MCHC: 31.3 g/dL (ref 30.0–36.0)
MCHC: 30.6 g/dL (ref 30.0–36.0)
MCV: 92.1 fL (ref 80.0–100.0)
MCV: 93.2 fL (ref 80.0–100.0)
Monocytes Absolute: 2.5 10*3/uL — ABNORMAL HIGH (ref 0.1–1.0)
Monocytes Absolute: 1.9 10*3/uL — ABNORMAL HIGH (ref 0.1–1.0)
Monocytes Relative: 16 %
Monocytes Relative: 15 %
Neutro Abs: 11.6 10*3/uL — ABNORMAL HIGH (ref 1.7–7.7)
Neutro Abs: 10 10*3/uL — ABNORMAL HIGH (ref 1.7–7.7)
Neutrophils Relative %: 77 %
Neutrophils Relative %: 75 %
Platelets: 273 10*3/uL (ref 150–400)
Platelets: 272 10*3/uL (ref 150–400)
RBC: 4.19 MIL/uL (ref 3.87–5.11)
RBC: 4.13 MIL/uL (ref 3.87–5.11)
RDW: 15.8 % — ABNORMAL HIGH (ref 11.5–15.5)
RDW: 15.7 % — ABNORMAL HIGH (ref 11.5–15.5)
Smear Review: NORMAL
Smear Review: NORMAL
WBC: 15.1 10*3/uL — ABNORMAL HIGH (ref 4.0–10.5)
WBC: 13.1 10*3/uL — ABNORMAL HIGH (ref 4.0–10.5)
nRBC: 0 % (ref 0.0–0.2)
nRBC: 0 % (ref 0.0–0.2)

## 2019-12-13 LAB — C DIFFICILE QUICK SCREEN W PCR REFLEX
C Diff antigen: POSITIVE — AB
C Diff toxin: NEGATIVE

## 2019-12-13 LAB — COMPREHENSIVE METABOLIC PANEL
ALT: 19 U/L (ref 0–44)
AST: 15 U/L (ref 15–41)
Albumin: 3.3 g/dL — ABNORMAL LOW (ref 3.5–5.0)
Alkaline Phosphatase: 53 U/L (ref 38–126)
Anion gap: 16 — ABNORMAL HIGH (ref 5–15)
BUN: 24 mg/dL — ABNORMAL HIGH (ref 8–23)
CO2: 28 mmol/L (ref 22–32)
Calcium: 8.5 mg/dL — ABNORMAL LOW (ref 8.9–10.3)
Chloride: 100 mmol/L (ref 98–111)
Creatinine, Ser: 0.91 mg/dL (ref 0.44–1.00)
GFR calc Af Amer: >60 mL/min (ref >60)
GFR calc non Af Amer: >60 mL/min (ref >60)
Glucose, Bld: 210 mg/dL — ABNORMAL HIGH (ref 70–99)
Potassium: 4.1 mmol/L (ref 3.5–5.1)
Sodium: 144 mmol/L (ref 135–145)
Total Bilirubin: 1.6 mg/dL — ABNORMAL HIGH (ref 0.3–1.2)
Total Protein: 6.9 g/dL (ref 6.5–8.1)

## 2019-12-13 LAB — MAGNESIUM: Magnesium: 3.3 mg/dL — ABNORMAL HIGH (ref 1.7–2.4)

## 2019-12-13 LAB — GLUCOSE, CAPILLARY
Glucose-Capillary: 209 mg/dL — ABNORMAL HIGH (ref 70–99)
Glucose-Capillary: 189 mg/dL — ABNORMAL HIGH (ref 70–99)
Glucose-Capillary: 205 mg/dL — ABNORMAL HIGH (ref 70–99)
Glucose-Capillary: 168 mg/dL — ABNORMAL HIGH (ref 70–99)

## 2019-12-13 LAB — CLOSTRIDIUM DIFFICILE BY PCR, REFLEXED: Toxigenic C. Difficile by PCR: POSITIVE — AB

## 2019-12-13 LAB — URINE CULTURE: Culture: NO GROWTH

## 2019-12-13 LAB — PHOSPHORUS: Phosphorus: 2.6 mg/dL (ref 2.5–4.6)

## 2019-12-13 MED ORDER — VANCOMYCIN 50 MG/ML ORAL SOLUTION
125.0000 mg | Freq: Four times a day (QID) | ORAL | Status: DC
  Administered 2019-12-13 – 2019-12-18 (×20): 125 mg via ORAL
  Filled 2019-12-13 (×23): qty 2.5

## 2019-12-13 MED ORDER — SODIUM CHLORIDE 0.9 % IV SOLN
2.0000 g | Freq: Three times a day (TID) | INTRAVENOUS | Status: DC
  Administered 2019-12-13 – 2019-12-14 (×3): 2 g via INTRAVENOUS
  Filled 2019-12-13 (×5): qty 2

## 2019-12-13 MED ORDER — RISAQUAD PO CAPS
1.0000 | ORAL_CAPSULE | Freq: Three times a day (TID) | ORAL | Status: DC
  Administered 2019-12-13 – 2019-12-16 (×10): 1 via ORAL
  Filled 2019-12-13 (×11): qty 1

## 2019-12-13 MED ORDER — LABETALOL HCL 100 MG PO TABS
100.0000 mg | ORAL_TABLET | Freq: Two times a day (BID) | ORAL | Status: DC
  Administered 2019-12-13 – 2019-12-16 (×7): 100 mg via ORAL
  Filled 2019-12-13 (×8): qty 1

## 2019-12-13 NOTE — Progress Notes (Signed)
PROGRESS NOTE    Patient: Molly [name]                            PCP: Ethelda Chick, MD                    DOB: [birth year]            DOA: 12/11/2019 KKX:[id number]             DOS: 12/13/2019, 8:23 AM   LOS: 1 day   Date of Service: The patient was seen and examined on 12/13/2019  Subjective:   The patient was seen and examined this Am. Stable, rectal tube in place, loose stool noted Still complaining of : Generalized weaknesses, Hemodynamically stable  Brief Narrative:   Molly [name]  is a 74 y.o. female with a known history of COPD, type 2 diabetes mellitus, hypertension, dyslipidemia and anxiety, who presented to the emergency room with acute onset of left lower quadrant as well as mid lower abdominal pain with associated constipation without nausea or vomiting or diarrhea.  She had no fever or chills at home.  No chest pain or dyspnea or cough or wheezing.  No dysuria, oliguria or hematuria or flank pain.  Upon presentation to the emergency room, temperature was 101.7 with a heart rate of 118 and pulse symmetry of 89% on room air 90% on 2 L of O2 by nasal cannula with a blood pressure 114/57 and respiratory rate of 19.  Labs revealed a blood glucose of 90-92 and procalcitonin of 7.51 with lactic acid 1.1 and high-sensitivity troponin I of 8.  CBC shows significant leukocytosis 23.5.  COVID-19 PCR came back negative. Abdominal pelvic CT scan showed dilated sigmoid colon and rectum with large amount of impacted stools with findings suggestive of proctocolitis.  She had also showed aortic atherosclerosis.  The patient had blood and urine cultures sent.  His stools grew enterotoxigenic E. coli.   The patient was given IV cefepime and Tylenol.  She will be admitted to a medically monitored bed for further evaluation and management   Assessment & Plan:   Active Problems:   Acute colitis  1.  Acute proctocolitis secondary to enterotoxigenic E. coli. -Hemodynamically stable,  afebrile normotensive -We will continue gentle IV fluid hydration -We will continue IV antibiotics-- on IV cefepime, Flagyl  -Monitoring closely -Contact precautions will be followed.   Sepsis without severe sepsis or septic shock secondary to acute proctocolitis.   This is manifested by significant leuko-cytosis as well as tachycardia. -Hemodynamically stable, still tachypneic, but afebrile, hypertensive, satting 100% on room air -Blood cultures negative to date -Stoole positive for Enterotoxic E. Coli -Loose stool now, pending C. difficile evaluation -Improved leukocytosis, 23.5, 15.1, 13.1 today -Procalcitonin 7.51,  Loose/watery stool -Likely after treatment of constipation/stool retention -Now requiring rectal tube due to persistent liquid stool -Evaluation of C. difficile colitis Hemodynamically stable afebrile, hypertensive -Improved leukocytosis  Type 2 diabetes mellitus. -We will check her blood sugar QA CHS, with NovoLog SSI coverage continue his basal coverage.  -We will continue Januvia.   Dyslipidemia. -Statin therapy will be continued.  Debility/generalized weaknesses PT/OT consult for evaluation recommendation   Nutritional status:         Cultures; Blood Cultures x 2 >> NGT Stool GI panel PCR : enterotoxigenic E. coli Stool C. difficile-  Antimicrobials: IV cefepime and IV Flagyl.     Consultants: None   ------------------------------------------------------------------------------------------------------------------------------------------------  DVT prophylaxis:  SCD/Compression stockings and Lovenox SQ Code Status:   Code Status: Full Code Family Communication: No family member present at bedside- The above findings and plan of care has been discussed with patient (and family )  in detail,  they expressed understanding and agreement of above. -Advance care planning has been discussed.   Admission status:    Status is:  Inpatient  Remains inpatient appropriate because:Ongoing active pain requiring inpatient pain management, Ongoing diagnostic testing needed not appropriate for outpatient work up, Unsafe d/c plan, IV treatments appropriate due to intensity of illness or inability to take PO and Inpatient level of care appropriate due to severity of illness    Dispo: The patient is from: Home              Anticipated d/c is to: Home              Anticipated d/c date is: 3 days              Patient currently is not medically stable to d/c.        Procedures:   No admission procedures for hospital encounter.  ** Antimicrobials:  Anti-infectives (From admission, onward)   Start     Dose/Rate Route Frequency Ordered Stop   12/13/19 1400  ceFEPIme (MAXIPIME) 2 g in sodium chloride 0.9 % 100 mL IVPB        2 g 200 mL/hr over 30 Minutes Intravenous Every 8 hours 12/13/19 0807     12/12/19 0800  ceFEPIme (MAXIPIME) 2 g in sodium chloride 0.9 % 100 mL IVPB  Status:  Discontinued        2 g 200 mL/hr over 30 Minutes Intravenous Every 12 hours 12/12/19 0246 12/13/19 0807   12/12/19 0400  metroNIDAZOLE (FLAGYL) IVPB 500 mg        500 mg 100 mL/hr over 60 Minutes Intravenous Every 8 hours 12/12/19 0246     12/12/19 0000  ceFEPIme (MAXIPIME) 1 g in sodium chloride 0.9 % 100 mL IVPB        1 g 200 mL/hr over 30 Minutes Intravenous  Once 12/11/19 2348 12/12/19 0112       Medication:  . aspirin EC  81 mg Oral Daily  . atorvastatin  20 mg Oral q1800  . canagliflozin  200 mg Oral QAC breakfast  . cholecalciferol  1,000 Units Oral Daily  . clopidogrel  75 mg Oral Daily  . diclofenac sodium  2 g Topical QID  . enoxaparin (LOVENOX) injection  40 mg Subcutaneous Q24H  . insulin aspart  0-20 Units Subcutaneous TID WC  . insulin aspart  0-5 Units Subcutaneous QHS  . lactobacillus acidophilus  2 tablet Oral TID  . linagliptin  5 mg Oral Daily  . magnesium oxide  400 mg Oral BID  . polyethylene glycol  17 g  Oral Daily  . senna-docusate  1 tablet Oral QHS  . traZODone  50 mg Oral QHS    acetaminophen **OR** acetaminophen, acetaminophen, albuterol, ondansetron **OR** ondansetron (ZOFRAN) IV, polyethylene glycol, traZODone   Objective:   Vitals:   12/12/19 2300 12/13/19 0004 12/13/19 0418 12/13/19 0659  BP: 137/64 (!) 120/57 (!) 122/58   Pulse: (!) 120 (!) 106 (!) 110   Resp: Temp: 97.7 F (36.5 C) 98.5 F (36.9 C) 97.7 F (36.5 C) 99.8 F (37.7 C)  TempSrc: Oral Oral Oral Axillary  SpO2: 97% 95% 100%   Weight: 94.9 kg  Height: 5\' 5"  (1.651 m)       Intake/Output Summary (Last 24 hours) at 12/13/2019 0823 Last data filed at 12/13/2019 12/15/2019 Gross per 24 hour  Intake 0 ml  Output --  Net 0 ml   Filed Weights   12/12/19 0817 12/12/19 2300  Weight: 99.8 kg 94.9 kg     Examination:   Physical Exam  Constitution:  Alert, cooperative, no distress,  Appears calm and comfortable  Psychiatric: Normal and stable mood and affect, cognition intact,   HEENT: Normocephalic, PERRL, otherwise with in Normal limits  Chest:Chest symmetric Cardio vascular:  S1/S2, RRR, No murmure, No Rubs or Gallops  pulmonary: Clear to auscultation bilaterally, respirations unlabored, negative wheezes / crackles Abdomen: Soft, non-tender, non-distended, bowel sounds,no masses, no organomegaly Muscular skeletal: Limited exam - in bed, able to move all 4 extremities, Normal strength,  Neuro: CNII-XII intact. , normal motor and sensation, reflexes intact  Extremities: No pitting edema lower extremities, +2 pulses  Skin: Dry, warm to touch, negative for any Rashes, No open wounds Wounds: per nursing documentation    ------------------------------------------------------------------------------------------------------------------------------------------    LABs:  CBC Latest Ref Rng & Units 12/13/2019 12/12/2019 12/11/2019  WBC 4.0 - 10.5 K/uL 15.1(H) 23.5(H) 26.2(H)  Hemoglobin 12.0 - 15.0  g/dL 12/13/2019 24.4 01.0  Hematocrit 36 - 46 % 38.6 40.2 42.1  Platelets 150 - 400 K/uL 273 298 321   CMP Latest Ref Rng & Units 12/13/2019 12/12/2019 12/11/2019  Glucose 70 - 99 mg/dL 12/13/2019) 536(U) 440(H)  BUN 8 - 23 mg/dL 474(Q) 23 21  Creatinine 0.44 - 1.00 mg/dL 59(D 6.38 7.56  Sodium 135 - 145 mmol/L 144 139 137  Potassium 3.5 - 5.1 mmol/L 4.1 4.6 4.3  Chloride 98 - 111 mmol/L 100 98 96(L)  CO2 22 - 32 mmol/L 28 29 27   Calcium 8.9 - 10.3 mg/dL 4.33) 8.9 9.1  Total Protein 6.5 - 8.1 g/dL 6.9 - 8.3(H)  Total Bilirubin 0.3 - 1.2 mg/dL ) - 1.4(H)  Alkaline Phos 38 - 126 U/L 53 - 68  AST 15 - 41 U/L 15 - 22  ALT 0 - 44 U/L 19 - 30       Micro Results Recent Results (from the past 240 hour(s))  Urine culture     Status: None   Collection Time: 12/11/19 10:11 PM   Specimen: Urine, Random  Result Value Ref Range Status   Specimen Description   Final    URINE, RANDOM Performed at [facility], [address], [place], [address] [place]    Special Requests   Final    NONE Performed at [facility], [address], [place], [address] [place]    Culture   Final    NO GROWTH Performed at [facility], [address] [place], [address] [place]    Report Status 12/13/2019 FINAL  Final  Culture, blood (routine x 2)     Status: None (Preliminary result)   Collection Time: 12/11/19 10:11 PM   Specimen: BLOOD RIGHT FOREARM  Result Value Ref Range Status   Specimen Description   Final    BLOOD RIGHT FOREARM Performed at [facility], [address] [place], [address] [place]    Special Requests   Final    BOTTLES DRAWN AEROBIC AND ANAEROBIC Blood Culture adequate volume   Culture  Setup Time PENDING  Incomplete   Culture   Final    NO GROWTH 2 DAYS Performed at [facility],  [address] [place], [place] [address]    Report Status PENDING  Incomplete  Culture, blood (routine x 2)     Status: None (Preliminary result)   Collection  Time: 12/11/19 10:12 PM   Specimen: BLOOD  Result Value Ref Range Status   Specimen Description BLOOD LEFT FA  Final   Special Requests   Final    BOTTLES DRAWN AEROBIC AND ANAEROBIC Blood Culture adequate volume   Culture   Final    NO GROWTH 2 DAYS Performed at [facility], [address] [place], [place] [address]    Report Status PENDING  Incomplete  Gastrointestinal Panel by PCR , Stool     Status: Abnormal   Collection Time: 12/11/19 10:12 PM   Specimen: Stool  Result Value Ref Range Status   Campylobacter species NOT DETECTED NOT DETECTED Final   Plesimonas shigelloides NOT DETECTED NOT DETECTED Final   Salmonella species NOT DETECTED NOT DETECTED Final   Yersinia enterocolitica NOT DETECTED NOT DETECTED Final   Vibrio species NOT DETECTED NOT DETECTED Final   Vibrio cholerae NOT DETECTED NOT DETECTED Final   Enteroaggregative E coli (EAEC) NOT DETECTED NOT DETECTED Final   Enteropathogenic E coli (EPEC) DETECTED (A) NOT DETECTED Final    Comment: RESULT CALLED TO, READ BACK BY AND VERIFIED WITH: ashton peters @0211  on 12/12/19 skl    Enterotoxigenic E coli (ETEC) NOT DETECTED NOT DETECTED Final   Shiga like toxin producing E coli (STEC) NOT DETECTED NOT DETECTED Final   Shigella/Enteroinvasive E coli (EIEC) NOT DETECTED NOT DETECTED Final   Cryptosporidium NOT DETECTED NOT DETECTED Final   Cyclospora cayetanensis NOT DETECTED NOT DETECTED Final   Entamoeba histolytica NOT DETECTED NOT DETECTED Final   Giardia lamblia NOT DETECTED NOT DETECTED Final   Adenovirus F40/41 NOT DETECTED NOT DETECTED Final   Astrovirus NOT DETECTED NOT DETECTED Final   Norovirus GI/GII NOT DETECTED NOT DETECTED Final   Rotavirus A NOT DETECTED NOT DETECTED Final   Sapovirus (I, II, IV, and V) NOT DETECTED NOT DETECTED Final    Comment: Performed at [facility], [address] [place], [place] [address]  SARS Coronavirus 2 by RT PCR (hospital order, performed in [facility] lab) Nasopharyngeal Nasopharyngeal Swab     Status: None   Collection Time: 12/11/19 11:10 PM   Specimen: Nasopharyngeal Swab  Result Value Ref Range Status   SARS Coronavirus 2 NEGATIVE NEGATIVE Final    Comment: (NOTE) SARS-CoV-2 target nucleic acids are NOT DETECTED.  The SARS-CoV-2 RNA is generally detectable in upper and lower respiratory specimens during the acute phase of infection. The lowest concentration of SARS-CoV-2 viral copies this assay can detect is 250 copies / mL. A negative result does not preclude SARS-CoV-2 infection and should not be used as the sole basis for treatment or other patient management decisions.  A negative result may occur with improper specimen collection / handling, submission of specimen other than nasopharyngeal swab, presence of viral mutation(s) within the areas targeted by this assay, and inadequate number of viral copies (<250 copies / mL). A negative result must be combined with clinical observations, patient history, and epidemiological information.  Fact Sheet for Patients:   BoilerBrush.com.cyhttps://www.fda.gov/media/136312/download  Fact Sheet for Healthcare Providers: https://pope.com/https://www.fda.gov/media/136313/download  This test is not yet approved or  cleared by the [place] FDA and has been authorized for detection and/or diagnosis of SARS-CoV-2 by FDA under an Emergency Use Authorization (EUA).  This EUA will remain in effect (  meaning this test can be used) for the duration of the COVID-19 declaration under Section 564(b)(1) of the Act, 21 U.S.C. section 360bbb-3(b)(1), unless the authorization is terminated or revoked sooner.  Performed at [facility], [address] [place], [place] [address]     Radiology Reports CT ABDOMEN PELVIS W CONTRAST  Result Date: 12/11/2019 CLINICAL DATA:  Abdominal pain fever and distension EXAM: CT ABDOMEN AND PELVIS WITH CONTRAST TECHNIQUE: Multidetector CT imaging of the abdomen and  pelvis was performed using the standard protocol following bolus administration of intravenous contrast. CONTRAST:  OMNIPAQUE IOHEXOL 300 MG/ML  SOLN COMPARISON:  None. FINDINGS: Lower chest: The visualized heart size within normal limits. No pericardial fluid/thickening. No hiatal hernia. The visualized portions of the lungs are clear. Hepatobiliary: The liver is normal in density without focal abnormality.The main portal vein is patent. Layering calcified gallstones are present. Pancreas: Unremarkable. No pancreatic ductal dilatation or surrounding inflammatory changes. Spleen: Normal in size without focal abnormality. Adrenals/Urinary Tract: Both adrenal glands appear normal. Mild bilateral renal atrophy is seen. Bladder is unremarkable. Stomach/Bowel: The stomach and small bowel normal in appearance. There is a large amount of colonic stool seen throughout. Within the sigmoid rectal junction there is a large amount of stool with a rectal wall. Diffuse wall thickening is seen around the rectum with surrounding presacral fat stranding changes. There is also wall thickening seen around the anal canal. Vascular/Lymphatic: There are no enlarged mesenteric, retroperitoneal, or pelvic lymph nodes. Scattered aortic atherosclerotic calcifications are seen without aneurysmal dilatation. Reproductive: The uterus and adnexa are unremarkable. Other: No evidence of abdominal wall mass or hernia. Musculoskeletal: No acute or significant osseous findings. IMPRESSION: 1. Dilated sigmoid colon and rectum with a large amount of impacted with findings which could be suggestive of proctocolitis. However would recommend direct visualization upon resolution of symptoms to exclude underlying mass lesion. 2.  Aortic Atherosclerosis (ICD10-I70.0). Electronically Signed   By: Jonna Clark M.D.   On: 12/11/2019 23:49   DG Chest Portable 1 View  Result Date: 12/12/2019 CLINICAL DATA:  Hypoxia EXAM: PORTABLE CHEST 1 VIEW  COMPARISON:  03/08/2016 FINDINGS: Low lung volumes. Mild cardiomegaly. No consolidation or effusion. No pneumothorax. IMPRESSION: Low lung volumes with mild cardiomegaly. Electronically Signed   By: Jasmine Pang M.D.   On: 12/12/2019 00:02    SIGNED: Kendell Bane, MD, FACP, FHM. [facility],  Pager (please use [email or web address] to [email or web address])  If 7PM-7AM, please contact night-coverage [email or web address] 12/13/2019, 8:23 AM

## 2019-12-13 NOTE — TOC Initial Note (Signed)
Transition of Care [facility]) - Initial/Assessment Note    Patient Details  Name: Molly [name] MRN: [id number] Date of Birth: [birth year]  Transition of Care [facility]) CM/SW Contact:    Chapman Fitch, RN Phone Number: 12/13/2019, 1:05 PM  Clinical Narrative:                 Due to history of dementia assessment completed with spouse via phone  Per spouse patient has been at [facility] for STR for 2 months.  He would like patient to return till the end of the month, then return home  States it's him and the patient live at home.   PCP Su Hilt,  Pharmacy - [facility] - denies issues obtaining medications  Patient has hospital bed, RW, and cane in the home.    TOC spoke with Stanton Kidney at [facility].  She states that patient has been private paying.  Patient can return at discharge  PT/OT pending Will need FL2 and auth prior to discharge     Expected Discharge Plan: Skilled Nursing Facility Barriers to Discharge: Continued Medical Work up   Patient Goals and CMS Choice        Expected Discharge Plan and Services Expected Discharge Plan: Skilled Nursing Facility       Living arrangements for the past 2 months: Skilled Nursing Facility                                      Prior Living Arrangements/Services Living arrangements for the past 2 months: Skilled Nursing Facility Lives with:: Facility Resident Patient language and need for interpreter reviewed:: Yes        Need for Family Participation in Patient Care: Yes (Comment) Care giver support system in place?: Yes (comment)   Criminal Activity/Legal Involvement Pertinent to Current Situation/Hospitalization: No - Comment as needed  Activities of Daily Living      Permission Sought/Granted                  Emotional Assessment       Orientation: : Fluctuating Orientation (Suspected and/or reported Sundowners)   Psych Involvement: No (comment)  Admission diagnosis:  Acute colitis  [K52.9] Abdominal pain, unspecified abdominal location [R10.9] Sepsis, due to unspecified organism, unspecified whether acute organ dysfunction present [facility]) [A41.9] Patient Active Problem List   Diagnosis Date Noted  . Acute colitis 12/12/2019  . Pressure injury of skin 04/28/2016  . Acute GI bleeding 04/27/2016  . Anemia, posthemorrhagic, acute   . Blood in stool   . Gastritis without bleeding   . Involuntary movements   . Acute respiratory failure (HCC) 03/05/2016  . Acute encephalopathy 02/06/2016  . Acute cerebrovascular accident (CVA) (HCC)   . Altered mental status   . 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 Cellulitis of left lower extremity 11/19/2014  . COPD (chronic obstructive pulmonary disease) (HCC) 11/19/2014  . HTN (hypertension) 11/19/2014  . HLD (hyperlipidemia) 11/19/2014  . Diabetes mellitus (HCC) 05/21/2014  . Chronic anemia 05/21/2014   PCP:  Ethelda Chick, MD Pharmacy:   [facility] [place] - [address] [place] [address] Phone: [phone] Fax: [phone]  [facility] [address] [place] - [address] [place] [address] Phone: [phone] Fax: [phone]     Social Determinants of Health (SDOH) Interventions    Readmission Risk Interventions Readmission Risk Prevention  Plan 12/13/2019  Transportation Screening Complete  Palliative Care Screening Not Applicable  Medication Review (RN Care Manager) Complete  Some recent data might be hidden

## 2019-12-13 NOTE — Progress Notes (Signed)
Pharmacy Antibiotic Note  Molly [name] is a 74 y.o. female admitted on 12/11/2019 with Cdiff.  Pharmacy has been consulted for Vancomycin PO dosing.  Plan: Patient with + Cdiff. Will conitnue Current order for Vancomycin 125 mg PO q6h F/u for possible discontinuation of abx   Height: 5\' 5"  (165.1 cm) Weight: 94.9 kg (209 lb 3.5 oz) IBW/kg (Calculated) : 57  Temp (24hrs), Avg:98.4 F (36.9 C), Min:97.7 F (36.5 C), Max:99.8 F (37.7 C)  Recent Labs  Lab 12/11/19 2211 12/12/19 0006 12/12/19 0348 12/13/19 0450 12/13/19 0943  WBC 26.2*  --  23.5* 15.1* 13.1*  CREATININE 0.87  --  0.98 0.91  --   LATICACIDVEN 1.7 1.1  --   --   --     Estimated Creatinine Clearance: 62.8 mL/min (by C-G formula based on SCr of 0.91 mg/dL).    No Known Allergies  Antimicrobials this admission: Metronidazole 9/14 >>   Cefepime 9/14 >> Vanc PO 9/15 >>  Dose adjustments this admission:    Microbiology results: 9/13 BCx: NGTD 9/13 UCx: NGTD    Sputum:      MRSA PCR:   GI panel= Enteropathogenic E coli (EPEC) 9/15 Cdiff PCR +  Thank you for allowing pharmacy to be a part of this patient's care.  Zachary Nole A 12/13/2019 5:24 PM

## 2019-12-13 NOTE — Evaluation (Signed)
Occupational Therapy Evaluation Patient Details Name: Molly [name] MRN: [id number] DOB: July 18, 1945 Today's Date: 12/13/2019    History of Present Illness Molly [name]  is a 74 y.o. female  who presented to ED from SNF with acute onset of left lower quadrant as well as mid lower abdominal pain with associated constipation without nausea or vomiting or diarrhea.  Abdominal pelvic CT scan showed dilated sigmoid colon and rectum with large amount of impacted stools with findings suggestive of proctocolitis.  She had also showed aortic atherosclerosis.  The patient had blood and urine cultures sent. stools grew enterotoxigenic E. coli. PMH of CVA (with L sided residual deficits), seizures, DM, COPD, anxiety, HLD, HTN, L toe amputations.   Clinical Impression   Molly [name] was seen for OT evaluation this date. Prior to hospital admission, pt was receiving care in a skilled nursing facility. Per notes, plan is for pt to return to SNF upon hospital DC. Currently pt demonstrates impairments as described below (See OT problem list) which functionally limit her ability to perform ADL/self-care tasks. Pt currently requires MAX-TOTAL A +2 for bed level toileting, bathing, and self-feeding. She requires +2 MOD-MAX A to perform rolling in bed. Pt noted to have food from lunch tray spilled over tray and bed upon OT arrival. NT notified she will likely require at least MIN A for self-feeding as she is significantly functionally limited by decreased functional use of her LUE.  Pt would benefit from skilled OT services to address noted impairments and functional limitations (see below for any additional details) in order to maximize safety and independence while minimizing falls risk and caregiver burden. Upon hospital discharge, recommend STR to maximize pt safety and return to PLOF.      Follow Up Recommendations  SNF;Supervision/Assistance - 24 hour    Equipment Recommendations   (TBD at next venue of care)     Recommendations for Other Services       Precautions / Restrictions Precautions Precautions: Fall Restrictions Weight Bearing Restrictions: No      Mobility Bed Mobility Overal bed mobility: Needs Assistance Bed Mobility: Rolling Rolling: Max assist;+2 for physical assistance         General bed mobility comments: Pt is able to roll onto L side with MOD A and cueing, MAX A for Rolling onto R side 2/2 decreased functional use of her LUE.  Transfers                 General transfer comment: Deferred. Pt unsafe/unable    Balance Overall balance assessment: Needs assistance             Standing balance comment: Not tested.                           ADL either performed or assessed with clinical judgement   ADL Overall ADL's : Needs assistance/impaired Eating/Feeding: Set up;Moderate assistance;Bed level   Grooming: Bed level;Moderate assistance   Upper Body Bathing: Maximal assistance;Bed level   Lower Body Bathing: Bed level;Total assistance;+2 for physical assistance   Upper Body Dressing : Moderate assistance;Bed level   Lower Body Dressing: Maximal assistance;+2 for physical assistance;Bed level       Toileting- Clothing Manipulation and Hygiene: Bed level;+2 for physical assistance Toileting - Clothing Manipulation Details (indicate cue type and reason): MAX +2 for rolling L/R for bed level peri care. Pt with rectal tube, noted to be leaking. RN notified/aware.  Vision Baseline Vision/History: Wears glasses Wears Glasses: At all times Patient Visual Report: No change from baseline       Perception     Praxis      Pertinent Vitals/Pain Pain Assessment: Faces Faces Pain Scale: Hurts little more Pain Location: "spinal column" Pain Descriptors / Indicators: Grimacing;Discomfort;Sore Pain Intervention(s): Monitored during session;Limited activity within patient's tolerance;Repositioned     Hand Dominance  Right   Extremity/Trunk Assessment Upper Extremity Assessment Upper Extremity Assessment: Generalized weakness;LUE deficits/detail LUE Deficits / Details: LUE notably spastic with digits kept in flexion and minimal AROM of hand, wrist, and elbow. Pt is able to achieve some extension to neutral with gentle passive stretch. Minimal AROM of shoulder with flexion to ~40 LUE Coordination: decreased gross motor;decreased fine motor   Lower Extremity Assessment Lower Extremity Assessment: Generalized weakness;LLE deficits/detail LLE Deficits / Details: Minimal noted AROM of LLE, pt unable to perform active knee flexion, dorsi/plantar flexion, etc. Defer to PT eval. LLE Coordination: decreased gross motor;decreased fine motor       Communication Communication Communication: No difficulties   Cognition Arousal/Alertness: Awake/alert Behavior During Therapy: WFL for tasks assessed/performed Overall Cognitive Status: History of cognitive impairments - at baseline                                 General Comments: No family/caregiver present to determine baseline. Pt oriented to self only.   General Comments  Pt on 2l [place] t/o session. Vitals remain WFL.    Exercises Other Exercises Other Exercises: Pt education limited 2/2 hx of cognitive impairments. OT facilitates bed level there-ex including gentle passive stretch of LUE digits, hand, wrist, and elbow. OT/NT facilitate bed level toileting, peri-care, bed bath, and linnen change.   Shoulder Instructions      Home Living Family/patient expects to be discharged to:: Skilled nursing facility                             Home Equipment: Gilmer Mor - single point;Walker - standard;Bedside commode;Hand held shower head;Walker - 2 wheels;Grab bars - toilet;Grab bars - tub/shower          Prior Functioning/Environment Level of Independence: Needs assistance        Comments: Pt unable to provide information regarding PLOF.  No family/caregiver present, per chart, pt was ambulating in the home with a RW ~2 months prior to initial admission, however some history also noted of consistent WC use at baseline. Per chart, husband assists with ADL management, bed mobility, etc.        OT Problem List: Decreased strength;Decreased coordination;Decreased safety awareness;Impaired balance (sitting and/or standing);Decreased knowledge of use of DME or AE;Decreased activity tolerance;Impaired UE functional use;Decreased range of motion;Cardiopulmonary status limiting activity      OT Treatment/Interventions: Self-care/ADL training;Therapeutic exercise;Therapeutic activities;DME and/or AE instruction;Patient/family education;Balance training;Energy conservation;Neuromuscular education    OT Goals(Current goals can be found in the care plan section) Acute Rehab OT Goals Patient Stated Goal: To go home OT Goal Formulation: With patient Time For Goal Achievement: 12/27/19 Potential to Achieve Goals: Fair ADL Goals Pt Will Perform Grooming: sitting;with min assist Pt Will Transfer to Toilet: bedside commode;stand pivot transfer;with +2 assist;with min assist (c LRAD PRN for improved safety and functional indep.) Pt Will Perform Toileting - Clothing Manipulation and hygiene: with adaptive equipment;sitting/lateral leans;with mod assist (c LRAD PRN for improved safety and functional indep.)  OT Frequency: Min 2X/week   Barriers to D/C: Inaccessible home environment          Co-evaluation              AM-PAC OT "6 Clicks" Daily Activity     Outcome Measure Help from another person eating meals?: A Little Help from another person taking care of personal grooming?: A Little Help from another person toileting, which includes using toliet, bedpan, or urinal?: Total Help from another person bathing (including washing, rinsing, drying)?: Total Help from another person to put on and taking off regular upper body clothing?: A  Lot Help from another person to put on and taking off regular lower body clothing?: A Lot 6 Click Score: 12   End of Session Equipment Utilized During Treatment: Oxygen  Activity Tolerance: Patient tolerated treatment well Patient left: in bed;with call bell/phone within reach;with bed alarm set;with nursing/sitter in room  OT Visit Diagnosis: Other abnormalities of gait and mobility (R26.89);Muscle weakness (generalized) (M62.81)                Time: 1357-1430 OT Time Calculation (min): 33 min Charges:  OT General Charges $OT Visit: 1 Visit OT Evaluation $OT Eval Moderate Complexity: 1 Mod OT Treatments $Self Care/Home Management : 23-37 mins  Rockney Ghee, M.S., OTR/L Ascom: [phone] 12/13/19, 4:08 PM

## 2019-12-14 DIAGNOSIS — A0472 Enterocolitis due to Clostridium difficile, not specified as recurrent: Secondary | ICD-10-CM | POA: Diagnosis present

## 2019-12-14 DIAGNOSIS — G8194 Hemiplegia, unspecified affecting left nondominant side: Secondary | ICD-10-CM

## 2019-12-14 DIAGNOSIS — I639 Cerebral infarction, unspecified: Secondary | ICD-10-CM

## 2019-12-14 LAB — CBC WITH DIFFERENTIAL/PLATELET
Abs Immature Granulocytes: 0.09 10*3/uL — ABNORMAL HIGH (ref 0.00–0.07)
Basophils Absolute: 0.1 10*3/uL (ref 0.0–0.1)
Basophils Relative: 1 %
Eosinophils Absolute: 0 10*3/uL (ref 0.0–0.5)
Eosinophils Relative: 0 %
HCT: 34 % — ABNORMAL LOW (ref 36.0–46.0)
Hemoglobin: 10.4 g/dL — ABNORMAL LOW (ref 12.0–15.0)
Immature Granulocytes: 1 %
Lymphocytes Relative: 10 %
Lymphs Abs: 1.2 10*3/uL (ref 0.7–4.0)
MCH: 28.7 pg (ref 26.0–34.0)
MCHC: 30.6 g/dL (ref 30.0–36.0)
MCV: 93.7 fL (ref 80.0–100.0)
Monocytes Absolute: 1.7 10*3/uL — ABNORMAL HIGH (ref 0.1–1.0)
Monocytes Relative: 15 %
Neutro Abs: 8.3 10*3/uL — ABNORMAL HIGH (ref 1.7–7.7)
Neutrophils Relative %: 73 %
Platelets: 258 10*3/uL (ref 150–400)
RBC: 3.63 MIL/uL — ABNORMAL LOW (ref 3.87–5.11)
RDW: 15.9 % — ABNORMAL HIGH (ref 11.5–15.5)
Smear Review: NORMAL
WBC: 11.4 10*3/uL — ABNORMAL HIGH (ref 4.0–10.5)
nRBC: 0 % (ref 0.0–0.2)

## 2019-12-14 LAB — GLUCOSE, CAPILLARY
Glucose-Capillary: 169 mg/dL — ABNORMAL HIGH (ref 70–99)
Glucose-Capillary: 206 mg/dL — ABNORMAL HIGH (ref 70–99)
Glucose-Capillary: 209 mg/dL — ABNORMAL HIGH (ref 70–99)
Glucose-Capillary: 206 mg/dL — ABNORMAL HIGH (ref 70–99)
Glucose-Capillary: 198 mg/dL — ABNORMAL HIGH (ref 70–99)

## 2019-12-14 LAB — BASIC METABOLIC PANEL
Anion gap: 12 (ref 5–15)
BUN: 27 mg/dL — ABNORMAL HIGH (ref 8–23)
CO2: 25 mmol/L (ref 22–32)
Calcium: 8.2 mg/dL — ABNORMAL LOW (ref 8.9–10.3)
Chloride: 107 mmol/L (ref 98–111)
Creatinine, Ser: 0.92 mg/dL (ref 0.44–1.00)
GFR calc Af Amer: >60 mL/min (ref >60)
GFR calc non Af Amer: >60 mL/min (ref >60)
Glucose, Bld: 194 mg/dL — ABNORMAL HIGH (ref 70–99)
Potassium: 4 mmol/L (ref 3.5–5.1)
Sodium: 144 mmol/L (ref 135–145)

## 2019-12-14 LAB — PROCALCITONIN: Procalcitonin: 2.21 ng/mL

## 2019-12-14 MED ORDER — METRONIDAZOLE IN NACL 5-0.79 MG/ML-% IV SOLN
500.0000 mg | Freq: Three times a day (TID) | INTRAVENOUS | Status: DC
  Administered 2019-12-14 – 2019-12-18 (×12): 500 mg via INTRAVENOUS
  Filled 2019-12-14 (×17): qty 100

## 2019-12-14 NOTE — [place] FL2 (Signed)
[place] MEDICAID FL2 LEVEL OF CARE SCREENING TOOL     IDENTIFICATION  Patient Name: Molly [name] Birthdate: 12-06-1945 Sex: female Admission Date (Current Location): 12/11/2019  [place] and [place] Number:  Chiropodist and Address:  [facility], [address], [place], [place] [address]      Provider Number: [id number]  Attending Physician Name and Address:  Kendell Bane, MD  Relative Name and Phone Number:       Current Level of Care: Hospital Recommended Level of Care: Skilled Nursing Facility Prior Approval Number:    Date Approved/Denied:   PASRR Number: [id number]  Discharge Plan: SNF    Current Diagnoses: Patient Active Problem List   Diagnosis Date Noted  . C. difficile colitis 12/14/2019  . Acute colitis 12/12/2019  . Pressure injury of skin 04/28/2016  . Acute GI bleeding 04/27/2016  . Anemia, posthemorrhagic, acute   . Blood in stool   . Gastritis without bleeding   . Involuntary movements   . Acute respiratory failure (HCC) 03/05/2016  . Acute encephalopathy 02/06/2016  . Acute cerebrovascular accident (CVA) (HCC)   . Altered mental status   . 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 Cellulitis of left lower extremity 11/19/2014  . COPD (chronic obstructive pulmonary disease) (HCC) 11/19/2014  . HTN (hypertension) 11/19/2014  . HLD (hyperlipidemia) 11/19/2014  . Sepsis (HCC) 11/19/2014  . Diabetes mellitus (HCC) 05/21/2014  . Chronic anemia 05/21/2014    Orientation RESPIRATION BLADDER Height & Weight     Self  Normal Incontinent Weight: 209 lb 3.5 oz (94.9 kg) Height:  5\' 5"  (165.1 cm)  BEHAVIORAL SYMPTOMS/MOOD NEUROLOGICAL BOWEL NUTRITION STATUS      Incontinent (rectal tube placed 9/15) Diet (heart healthy, thin liquids)  AMBULATORY STATUS COMMUNICATION OF NEEDS Skin   Extensive Assist Verbally Normal                       Personal Care Assistance Level of Assistance  Bathing, Feeding, Dressing  Bathing Assistance: Maximum assistance Feeding assistance: Independent Dressing Assistance: Maximum assistance     Functional Limitations Info  Sight, Hearing, Speech Sight Info: Adequate Hearing Info: Adequate Speech Info: Adequate    SPECIAL CARE FACTORS FREQUENCY  PT (By licensed PT), OT (By licensed OT)     PT Frequency: 5x OT Frequency: 5x            Contractures Contractures Info: Not present    Additional Factors Info  Code Status, Allergies Code Status Info: Full COde Allergies Info: NO known allergies           Current Medications (12/14/2019):  This is the current hospital active medication list Current Facility-Administered Medications  Medication Dose Route Frequency Provider Last Rate Last Admin  . 0.9 %  sodium chloride infusion   Intravenous Continuous Mansy, Jan A, MD 100 mL/hr at 12/14/19 1437 New Bag at 12/14/19 1437  . acetaminophen (TYLENOL) tablet 650 mg  650 mg Oral Q6H PRN Mansy, Jan A, MD       Or  . acetaminophen (TYLENOL) suppository 650 mg  650 mg Rectal Q6H PRN Mansy, Jan A, MD      . acetaminophen (TYLENOL) tablet 1,000 mg  1,000 mg Oral Q6H PRN Feb, MD   1,000 mg at 12/14/19 1158  . acidophilus (RISAQUAD) capsule 1 capsule  1 capsule Oral TID 12/16/19 A, MD   1 capsule at 12/14/19 0948  . albuterol (PROVENTIL) (2.5 MG/3ML) 0.083% nebulizer  solution 3 mL  3 mL Inhalation Q4H PRN Mansy, Jan A, MD      . aspirin EC tablet 81 mg  81 mg Oral Daily Mansy, Jan A, MD   81 mg at 12/14/19 0948  . atorvastatin (LIPITOR) tablet 20 mg  20 mg Oral q1800 Mansy, Jan A, MD   20 mg at 12/13/19 1853  . canagliflozin [facility]) tablet 200 mg  200 mg Oral QAC breakfast Mansy, Jan A, MD   200 mg at 12/14/19 0949  . cholecalciferol (VITAMIN D3) tablet 1,000 Units  1,000 Units Oral Daily Mansy, Vernetta Honey, MD   1,000 Units at 12/14/19 0948  . clopidogrel (PLAVIX) tablet 75 mg  75 mg Oral Daily Mansy, Jan A, MD   75 mg at 12/14/19 0948  . diclofenac  sodium (VOLTAREN) 1 % transdermal gel 2 g  2 g Topical QID Mansy, Jan A, MD   2 g at 12/14/19 1206  . enoxaparin (LOVENOX) injection 40 mg  40 mg Subcutaneous Q24H Mansy, Jan A, MD   40 mg at 12/14/19 1200  . insulin aspart (novoLOG) injection 0-20 Units  0-20 Units Subcutaneous TID WC SheikhKateri Mc [place], DO   7 Units at 12/14/19 1200  . insulin aspart (novoLOG) injection 0-5 Units  0-5 Units Subcutaneous QHS Sheikh, Omair Latif, DO      . labetalol (NORMODYNE) tablet 100 mg  100 mg Oral BID Nevin Bloodgood A, MD   100 mg at 12/14/19 0950  . linagliptin (TRADJENTA) tablet 5 mg  5 mg Oral Daily Mansy, Jan A, MD   5 mg at 12/14/19 0949  . metroNIDAZOLE (FLAGYL) IVPB 500 mg  500 mg Intravenous Q8H Ravishankar, Rhodia Albright, MD 100 mL/hr at 12/14/19 1206 500 mg at 12/14/19 1206  . ondansetron (ZOFRAN) tablet 4 mg  4 mg Oral Q6H PRN Mansy, Jan A, MD       Or  . ondansetron [facility]) injection 4 mg  4 mg Intravenous Q6H PRN Mansy, Jan A, MD      . polyethylene glycol (MIRALAX / GLYCOLAX) packet 17 g  17 g Oral Daily PRN Mansy, Jan A, MD      . traZODone (DESYREL) tablet 50 mg  50 mg Oral QHS Mansy, Jan A, MD   50 mg at 12/13/19 2143  . vancomycin (VANCOCIN) 50 mg/mL oral solution 125 mg  125 mg Oral Q6H Shahmehdi, Seyed A, MD   125 mg at 12/14/19 1201     Discharge Medications: Please see discharge summary for a list of discharge medications.  Relevant Imaging Results:  Relevant Lab Results:   Additional Information SSN:[id number]  Reuel Boom Boyde Grieco, LCSW

## 2019-12-14 NOTE — Progress Notes (Signed)
   12/14/19 1131  Assess: MEWS Score  Temp (!) 102.3 F (39.1 C) (RN Donald Pore notified)  BP 135/75  Pulse Rate 100  Resp 20  SpO2 97 %  Assess: MEWS Score  MEWS Temp 2  MEWS Systolic 0  MEWS Pulse 0  MEWS RR 0  MEWS LOC 0  MEWS Score 2  MEWS Score Color Yellow  Assess: if the MEWS score is Yellow or Red  Were vital signs taken at a resting state? Yes  Focused Assessment Change from prior assessment (see assessment flowsheet)  Early Detection of Sepsis Score *See Row Information* Low  MEWS guidelines implemented *See Row Information* Yes  Treat  MEWS Interventions Administered prn meds/treatments  Pain Scale PAINAD  Breathing 0  Negative Vocalization 0  Facial Expression 0  Body Language 0  Consolability 0  PAINAD Score 0  Take Vital Signs  Increase Vital Sign Frequency  Yellow: Q 2hr X 2 then Q 4hr X 2, if remains yellow, continue Q 4hrs  Escalate  MEWS: Escalate Yellow: discuss with charge nurse/RN and consider discussing with provider and RRT  Notify: Charge Nurse/RN  Name of Charge Nurse/RN Notified Miles Costain, RN  Date Charge Nurse/RN Notified 12/14/19  Time Charge Nurse/RN Notified 1135  Notify: Provider  Provider Name/Title Dr. Flossie Dibble  Date Provider Notified 12/14/19  Time Provider Notified 1135  Notification Type Page  Notification Reason Change in status (elevated temp)  Response Other (Comment) (ID notified, blood cultures ordered and tylenol given)  Date of Provider Response 12/14/19  Time of Provider Response 1136  Notify: Rapid Response  Name of Rapid Response RN Notified  (not notified)  Document  Patient Outcome Not stable and remains on department  Progress note created (see row info) Yes  Yellow MEWS score protocol initiated. MD, ID, charge nurse RN, and nursing assistant notified of change in status. Orders placed by ID provider and tylenol given.

## 2019-12-14 NOTE — Consult Note (Signed)
NAME: CIARRAH RAE  DOB: [birth year]  MRN: [id number]  Date/Time: 12/14/2019 11:31 AM  REQUESTING PROVIDER: Dr.Shahmehdi Subjective:  REASON FOR CONSULT: cdiff colitis and e.coli in stool ?pt is a poor historian, chart reviewed Molly [name] is a 74 y.o. female with a history of DM, HTN, CVA admitted from [facility]  on 9/13 with not having a bowel movt.inspite of receiving enemas. She had abdominal pain and distension and in the ED had a temp of 101.7, BP 114/57, HR 121, 89% sats.Blood culture sent CT abdomen and pelvis revealed  large amount of colonic stool seen throughout. Within the sigmoid rectal junction there was a large amount of stool with rectal wall diffuse thickening is seen around the rectum with surrounding presacral fat stranding changes. There is also wall thickening seen around the anal canal. WBC was 23.2, HB 13.7, PLT 321, cr 0.87, lactate 1.7, Tb 1.4, TP 8.3 Stool sent for GI pCR which was enteropathogenic e.coli. She was started on cefepime and flagyl and then given senna- on 9/15 stool sent for Cdiff was antigen positive , toxin neg and PCR positive I am asked to see her for the same She is also on PO vanco  Pt in Jan 2018 underwent colonoscopy and  SEVERE CHRONIC ACTIVE INFLAMMATION AND ULCERATION.of the rectum was seen    Past Medical History:  Diagnosis Date  . Anxiety   . COPD (chronic obstructive pulmonary disease) (HCC)   . Diabetes (HCC)   . Hemorrhoid   . HLD (hyperlipidemia)   . HTN (hypertension)   . Stroke [facility]) 2008   left weakness    Past Surgical History:  Procedure Laterality Date  . AMPUTATION Left 05/22/2014   Procedure: AMPUTATION RAY LEFT FOURTH AND FIFTH ;  Surgeon: Toni Arthurs, MD;  Location: [facility] OR;  Service: Orthopedics;  Laterality: Left;  . CATARACT EXTRACTION    . COLONOSCOPY  2004  . COLONOSCOPY WITH PROPOFOL N/A 04/29/2016   Procedure: COLONOSCOPY WITH PROPOFOL;  Surgeon: Wyline Mood, MD;  Location: [facility] ENDOSCOPY;  Service:  Gastroenterology;  Laterality: N/A;  . ESOPHAGOGASTRODUODENOSCOPY (EGD) WITH PROPOFOL N/A 04/27/2016   Procedure: ESOPHAGOGASTRODUODENOSCOPY (EGD) WITH PROPOFOL;  Surgeon: Midge Minium, MD;  Location: [facility] ENDOSCOPY;  Service: Endoscopy;  Laterality: N/A;    Social History   Socioeconomic History  . Marital status: Married    Spouse name: Not on file  . Number of children: Not on file  . Years of education: Not on file  . Highest education level: Not on file  Occupational History  . Not on file  Tobacco Use  . Smoking status: Former Smoker    Packs/day: 0.50    Years: 2.00    Pack years: 1.00    Types: Cigarettes    Quit date: 11/18/1968    Years since quitting: 51.1  . Smokeless tobacco: Never Used  Substance and Sexual Activity  . Alcohol use: No    Alcohol/week: 0.0 standard drinks    Comment: seldom  . Drug use: No  . Sexual activity: Never  Other Topics Concern  . Not on file  Social History Narrative  . Not on file   Social Determinants of Health   Financial Resource Strain:   . Difficulty of Paying Living Expenses: Not on file  Food Insecurity:   . Worried About Programme researcher, broadcasting/film/video in the Last Year: Not on file  . Ran Out of Food in the Last Year: Not on file  Transportation Needs:   .  Lack of Transportation (Medical): Not on file  . Lack of Transportation (Non-Medical): Not on file  Physical Activity:   . Days of Exercise per Week: Not on file  . Minutes of Exercise per Session: Not on file  Stress:   . Feeling of Stress : Not on file  Social Connections:   . Frequency of Communication with Friends and Family: Not on file  . Frequency of Social Gatherings with Friends and Family: Not on file  . Attends Religious Services: Not on file  . Active Member of Clubs or Organizations: Not on file  . Attends Banker Meetings: Not on file  . Marital Status: Not on file  Intimate Partner Violence:   . Fear of Current or Ex-Partner: Not on file  .  Emotionally Abused: Not on file  . Physically Abused: Not on file  . Sexually Abused: Not on file    Family History  Problem Relation Age of Onset  . Hyperlipidemia Mother   . Varicose Veins Mother   . Deep vein thrombosis Father   . Stroke Father   . Breast cancer Paternal Aunt   . Stroke Maternal Grandmother   . Stroke Paternal Grandmother    No Known Allergies  ? Current Facility-Administered Medications  Medication Dose Route Frequency Provider Last Rate Last Admin  . 0.9 %  sodium chloride infusion   Intravenous Continuous Mansy, Jan A, MD 100 mL/hr at 12/13/19 1106 New Bag at 12/13/19 1106  . acetaminophen (TYLENOL) tablet 650 mg  650 mg Oral Q6H PRN Mansy, Jan A, MD       Or  . acetaminophen (TYLENOL) suppository 650 mg  650 mg Rectal Q6H PRN Mansy, Jan A, MD      . acetaminophen (TYLENOL) tablet 1,000 mg  1,000 mg Oral Q6H PRN Arnaldo Natal, MD   1,000 mg at 12/13/19 0701  . acidophilus (RISAQUAD) capsule 1 capsule  1 capsule Oral TID Nevin Bloodgood A, MD   1 capsule at 12/14/19 0948  . albuterol (PROVENTIL) (2.5 MG/3ML) 0.083% nebulizer solution 3 mL  3 mL Inhalation Q4H PRN Mansy, Jan A, MD      . aspirin EC tablet 81 mg  81 mg Oral Daily Mansy, Jan A, MD   81 mg at 12/14/19 0948  . atorvastatin (LIPITOR) tablet 20 mg  20 mg Oral q1800 Mansy, Jan A, MD   20 mg at 12/13/19 1853  . canagliflozin [facility]) tablet 200 mg  200 mg Oral QAC breakfast Mansy, Jan A, MD   200 mg at 12/14/19 0949  . cholecalciferol (VITAMIN D3) tablet 1,000 Units  1,000 Units Oral Daily Mansy, Vernetta Honey, MD   1,000 Units at 12/14/19 0948  . clopidogrel (PLAVIX) tablet 75 mg  75 mg Oral Daily Mansy, Jan A, MD   75 mg at 12/14/19 0948  . diclofenac sodium (VOLTAREN) 1 % transdermal gel 2 g  2 g Topical QID Mansy, Jan A, MD   2 g at 12/14/19 0950  . enoxaparin (LOVENOX) injection 40 mg  40 mg Subcutaneous Q24H Mansy, Jan A, MD   40 mg at 12/13/19 1317  . insulin aspart (novoLOG) injection 0-20 Units   0-20 Units Subcutaneous TID WC SheikhKateri [place], DO   4 Units at 12/14/19 0930  . insulin aspart (novoLOG) injection 0-5 Units  0-5 Units Subcutaneous QHS Sheikh, Omair Latif, DO      . labetalol (NORMODYNE) tablet 100 mg  100 mg Oral BID Kendell Bane, MD  100 mg at 12/14/19 0950  . linagliptin (TRADJENTA) tablet 5 mg  5 mg Oral Daily Mansy, Jan A, MD   5 mg at 12/14/19 0949  . magnesium oxide (MAG-OX) tablet 400 mg  400 mg Oral BID Mansy, Jan A, MD   400 mg at 12/14/19 0948  . ondansetron (ZOFRAN) tablet 4 mg  4 mg Oral Q6H PRN Mansy, Jan A, MD       Or  . ondansetron [facility]) injection 4 mg  4 mg Intravenous Q6H PRN Mansy, Jan A, MD      . polyethylene glycol (MIRALAX / GLYCOLAX) packet 17 g  17 g Oral Daily PRN Mansy, Jan A, MD      . senna-docusate (Senokot-S) tablet 1 tablet  1 tablet Oral QHS Mansy, Vernetta Honey, MD   1 tablet at 12/13/19 2143  . traZODone (DESYREL) tablet 50 mg  50 mg Oral QHS Mansy, Jan A, MD   50 mg at 12/13/19 2143  . vancomycin (VANCOCIN) 50 mg/mL oral solution 125 mg  125 mg Oral Q6H Shahmehdi, Seyed A, MD   125 mg at 12/14/19 0607     Abtx:  Anti-infectives (From admission, onward)   Start     Dose/Rate Route Frequency Ordered Stop   12/13/19 1800  vancomycin (VANCOCIN) 50 mg/mL oral solution 125 mg        125 mg Oral Every 6 hours 12/13/19 1704     12/13/19 1400  ceFEPIme (MAXIPIME) 2 g in sodium chloride 0.9 % 100 mL IVPB  Status:  Discontinued        2 g 200 mL/hr over 30 Minutes Intravenous Every 8 hours 12/13/19 0807 12/14/19 1129   12/12/19 0800  ceFEPIme (MAXIPIME) 2 g in sodium chloride 0.9 % 100 mL IVPB  Status:  Discontinued        2 g 200 mL/hr over 30 Minutes Intravenous Every 12 hours 12/12/19 0246 12/13/19 0807   12/12/19 0400  metroNIDAZOLE (FLAGYL) IVPB 500 mg  Status:  Discontinued        500 mg 100 mL/hr over 60 Minutes Intravenous Every 8 hours 12/12/19 0246 12/14/19 1129   12/12/19 0000  ceFEPIme (MAXIPIME) 1 g in sodium chloride 0.9 %  100 mL IVPB        1 g 200 mL/hr over 30 Minutes Intravenous  Once 12/11/19 2348 12/12/19 0112      REVIEW OF SYSTEMS:   Says she has abdominal pain Constipation, has spoor appetitie Does not complain of chest pain, sob, cough, head ache, nausea, vomiting, bleeding per rectum ? Pertinent Positives include : Objective:  VITALS:  BP 139/61 (BP Location: Right Arm)   Pulse (!) 105   Temp 98.2 F (36.8 C) (Oral)   Resp 16   Ht 5\' 5"  (1.651 m)   Wt 94.9 kg   SpO2 96%   BMI 34.82 kg/m  PHYSICAL EXAM:  General: Awake  restless oriented in self only Head: Normocephalic, without obvious abnormality, atraumatic. Eyes: Conjunctivae clear, anicteric sclerae. Pupils are equal ENT Nares normal. No drainage or sinus tenderness. Tongue coated Neck: Supple, Lungs:b/l air entry Heart:s1s2. Abdomen: distended. Bowel sounds normal. No masses Extremities: atraumatic, no cyanosis. No edema. No clubbing Skin: No rashes or lesions. Or bruising Lymph: Cervical, supraclavicular normal. Neurologic: left arm spastic and contracted  Pertinent Labs Lab Results CBC    Component Value Date/Time   WBC 13.1 (H) 12/13/2019 0943   RBC 4.13 12/13/2019 0943   HGB 11.8 (L) 12/13/2019 12/15/2019  HCT 38.5 12/13/2019 0943   PLT 272 12/13/2019 0943   MCV 93.2 12/13/2019 0943   MCH 28.6 12/13/2019 0943   MCHC 30.6 12/13/2019 0943   RDW 15.7 (H) 12/13/2019 0943   LYMPHSABS 1.1 12/13/2019 0943   MONOABS 1.9 (H) 12/13/2019 0943   EOSABS 0.0 12/13/2019 0943   BASOSABS 0.1 12/13/2019 0943    CMP Latest Ref Rng & Units 12/14/2019 12/13/2019 12/12/2019  Glucose 70 - 99 mg/dL 161(W194(H) 960(A210(H) 540(J292(H)  BUN 8 - 23 mg/dL 81(X27(H) 91(Y24(H) 23  Creatinine 0.44 - 1.00 mg/dL 7.820.92 9.560.91 2.130.98  Sodium 135 - 145 mmol/L 144 144 139  Potassium 3.5 - 5.1 mmol/L 4.0 4.1 4.6  Chloride 98 - 111 mmol/L 107 100 98  CO2 22 - 32 mmol/L 25 28 29   Calcium 8.9 - 10.3 mg/dL 8.2(L) 8.5(L) 8.9  Total Protein 6.5 - 8.1 g/dL - 6.9 -  Total  Bilirubin 0.3 - 1.2 mg/dL - 1.6(H) -  Alkaline Phos 38 - 126 U/L - 53 -  AST 15 - 41 U/L - 15 -  ALT 0 - 44 U/L - 19 -      Microbiology: Recent Results (from the past 240 hour(s))  Urine culture     Status: None   Collection Time: 12/11/19 10:11 PM   Specimen: Urine, Random  Result Value Ref Range Status   Specimen Description   Final    URINE, RANDOM Performed at [facility], [address] [place], [place] [address]    Special Requests   Final    NONE Performed at [facility], [address] [place], [place] [address]    Culture   Final    NO GROWTH Performed at [facility], [address] [place], [place] [address]    Report Status 12/13/2019 FINAL  Final  Culture, blood (routine x 2)     Status: None (Preliminary result)   Collection Time: 12/11/19 10:11 PM   Specimen: BLOOD RIGHT FOREARM  Result Value Ref Range Status   Specimen Description   Final    BLOOD RIGHT FOREARM Performed at [facility], [address] [place], [place] [address]    Special Requests   Final    BOTTLES DRAWN AEROBIC AND ANAEROBIC Blood Culture adequate volume   Culture   Final    NO GROWTH 3 DAYS Performed at [facility], [address] [place], [place] [address]    Report Status PENDING  Incomplete  Culture, blood (routine x 2)     Status: None (Preliminary result)   Collection Time: 12/11/19 10:12 PM   Specimen: BLOOD  Result Value Ref Range Status   Specimen Description BLOOD LEFT FA  Final   Special Requests   Final    BOTTLES DRAWN AEROBIC AND ANAEROBIC Blood Culture adequate volume   Culture   Final    NO GROWTH 3 DAYS Performed at [facility], [address] [place], [place] [address]    Report Status PENDING  Incomplete  Gastrointestinal Panel by PCR , Stool     Status: Abnormal   Collection Time: 12/11/19 10:12 PM   Specimen: Stool  Result Value Ref Range Status   Campylobacter species NOT DETECTED NOT  DETECTED Final   Plesimonas shigelloides NOT DETECTED NOT DETECTED Final   Salmonella species NOT DETECTED NOT DETECTED Final   Yersinia enterocolitica NOT DETECTED NOT DETECTED Final   Vibrio species NOT DETECTED NOT DETECTED Final   Vibrio cholerae NOT  DETECTED NOT DETECTED Final   Enteroaggregative E coli (EAEC) NOT DETECTED NOT DETECTED Final   Enteropathogenic E coli (EPEC) DETECTED (A) NOT DETECTED Final    Comment: RESULT CALLED TO, READ BACK BY AND VERIFIED WITH: ashton peters  on 12/12/19 skl    Enterotoxigenic E coli (ETEC) NOT DETECTED NOT DETECTED Final   Shiga like toxin producing E coli (STEC) NOT DETECTED NOT DETECTED Final   Shigella/Enteroinvasive E coli (EIEC) NOT DETECTED NOT DETECTED Final   Cryptosporidium NOT DETECTED NOT DETECTED Final   Cyclospora cayetanensis NOT DETECTED NOT DETECTED Final   Entamoeba histolytica NOT DETECTED NOT DETECTED Final   Giardia lamblia NOT DETECTED NOT DETECTED Final   Adenovirus F40/41 NOT DETECTED NOT DETECTED Final   Astrovirus NOT DETECTED NOT DETECTED Final   Norovirus GI/GII NOT DETECTED NOT DETECTED Final   Rotavirus A NOT DETECTED NOT DETECTED Final   Sapovirus (I, II, IV, and V) NOT DETECTED NOT DETECTED Final    Comment: Performed at [facility], [address] [place], [place] [address]  SARS Coronavirus 2 by RT PCR (hospital order, performed in [facility] hospital lab) Nasopharyngeal Nasopharyngeal Swab     Status: None   Collection Time: 12/11/19 11:10 PM   Specimen: Nasopharyngeal Swab  Result Value Ref Range Status   SARS Coronavirus 2 NEGATIVE NEGATIVE Final    Comment: (NOTE) SARS-CoV-2 target nucleic acids are NOT DETECTED.  The SARS-CoV-2 RNA is generally detectable in upper and lower respiratory specimens during the acute phase of infection. The lowest concentration of SARS-CoV-2 viral copies this assay can detect is 250 copies / mL. A negative result does not preclude SARS-CoV-2  infection and should not be used as the sole basis for treatment or other patient management decisions.  A negative result may occur with improper specimen collection / handling, submission of specimen other than nasopharyngeal swab, presence of viral mutation(s) within the areas targeted by this assay, and inadequate number of viral copies (<250 copies / mL). A negative result must be combined with clinical observations, patient history, and epidemiological information.  Fact Sheet for Patients:   BoilerBrush.com.cy  Fact Sheet for Healthcare Providers: https://pope.com/  This test is not yet approved or  cleared by the [place] FDA and has been authorized for detection and/or diagnosis of SARS-CoV-2 by FDA under an Emergency Use Authorization (EUA).  This EUA will remain in effect (meaning this test can be used) for the duration of the COVID-19 declaration under Section 564(b)(1) of the Act, 21 U.S.C. section 360bbb-3(b)(1), unless the authorization is terminated or revoked sooner.  Performed at [facility], [address] [place], [place] [address]   C Difficile Quick Screen w PCR reflex     Status: Abnormal   Collection Time: 12/13/19 11:10 AM   Specimen: STOOL  Result Value Ref Range Status   C Diff antigen POSITIVE (A) NEGATIVE Final   C Diff toxin NEGATIVE NEGATIVE Final   C Diff interpretation Results are indeterminate. See PCR results.  Final    Comment: Performed at [facility], [address] [place], [place] [address]  C. Diff by PCR, Reflexed     Status: Abnormal   Collection Time: 12/13/19 11:10 AM  Result Value Ref Range Status   Toxigenic C. Difficile by PCR POSITIVE (A) NEGATIVE Final    Comment: Positive for toxigenic C. difficile with little to no toxin production. Only treat if clinical presentation suggests symptomatic illness. Performed at [facility], [address] [place], [place] [address]     IMAGING RESULTS: I have personally reviewed the films ? 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 ? ___________________________________________________ Discussed with care team Note:  This document was prepared using Dragon voice recognition software and may include unintentional dictation errors.

## 2019-12-14 NOTE — Progress Notes (Signed)
PROGRESS NOTE    Patient: Molly [name]                            PCP: Ethelda Chick, MD                    DOB: [birth year]            DOA: 12/11/2019 EXB:[id number]             DOS: 12/14/2019, 11:26 AM   LOS: 2 days   Date of Service: The patient was seen and examined on 12/14/2019  Subjective:   The patient was seen and examined this morning, stable no acute distress rectal tube in place, loose stool noted,. Remains hemodynamically stable afebrile normotensive, still mildly tachycardic heart rate 105 Complain of generalized weakness, Denies any chest pain or shortness of breath.  Brief Narrative:   Ellamay Fors  is a 74 y.o. female with a known history of COPD, type 2 diabetes mellitus, hypertension, dyslipidemia and anxiety, who presented to the emergency room with acute onset of left lower quadrant as well as mid lower abdominal pain with associated constipation without nausea or vomiting or diarrhea.  She had no fever or chills at home.  No chest pain or dyspnea or cough or wheezing.  No dysuria, oliguria or hematuria or flank pain.  Upon presentation to the emergency room, temperature was 101.7 with a heart rate of 118 and pulse symmetry of 89% on room air 90% on 2 L of O2 by nasal cannula with a blood pressure 114/57 and respiratory rate of 19.  Labs revealed a blood glucose of 90-92 and procalcitonin of 7.51 with lactic acid 1.1 and high-sensitivity troponin I of 8.  CBC shows significant leukocytosis 23.5.  COVID-19 PCR came back negative. Abdominal pelvic CT scan showed dilated sigmoid colon and rectum with large amount of impacted stools with findings suggestive of proctocolitis.  She had also showed aortic atherosclerosis.  The patient had blood and urine cultures sent.  His stools grew enterotoxigenic E. coli.   The patient was given IV cefepime and Tylenol.  She will be admitted to a medically monitored bed for further evaluation and management   Assessment & Plan:    Active Problems:   Acute colitis  C. difficile colitis -Continues to have loose stool -Rectal tube in place -Continue with fluid hydration -We will continue with IV Flagyl, p.o. vancomycin  Infectious disease consulted-appreciate assistance in this case is complicated by proctitis, enterotoxic E. coli colitis, complicated by C. difficile colitis    Acute proctocolitis secondary to enterotoxigenic E. coli. -Hemodynamically stable, afebrile normotensive -We will continue gentle IV fluid hydration -We will continue IV antibiotics-- on IV cefepime, Flagyl  -Monitoring closely -Infectious disease team recommending no treatment for enterotoxic E. coli therefore cefepime can be DC'd -Contact precautions will be followed.   Sepsis  -without severe sepsis or septic shock secondary to acute proctocolitis.   This is manifested by significant leuko-cytosis as well as tachycardia. -Hemodynamically stable, still tachypneic, but afebrile, hypertensive, satting 100% on room air -Improved sepsis physiology, with exception of tachycardia -Blood cultures negative to date -Stoole positive for Enterotoxic E. Coli -Loose stool now, pending C. difficile evaluation -Improved leukocytosis, 23.5, 15.1, 13.1 today -Procalcitonin 7.51,  Loose/watery stool -Rectal tube in place, per nursing staff improving liquid diarrhea  Type 2 diabetes mellitus. -We will check her blood sugar QA CHS, with NovoLog SSI coverage continue  his basal coverage.  -We will continue Januvia. -Blood sugars remained stable   Dyslipidemia. -Statin therapy will be continued -Stable.  Debility/generalized weaknesses PT/OT consult for evaluation recommendation   Nutritional status:         Cultures; Blood Cultures x 2 >> NGT Stool GI panel PCR : enterotoxigenic E. coli Stool C. difficile-+ 12/13/2019  Antimicrobials: 12/11/2019 IV cefepime >> 12/14/2019 -12/12/2018 IV Flagyl >>> -12/13/2019 p.o.  vancomycin    Consultants: None   ------------------------------------------------------------------------------------------------------------------------------------------------  DVT prophylaxis:  SCD/Compression stockings and Lovenox SQ Code Status:   Code Status: Full Code Family Communication: No family member present at bedside- The above findings and plan of care has been discussed with patient (and family )  in detail,  they expressed understanding and agreement of above. -Advance care planning has been discussed.   Admission status:    Status is: Inpatient  Remains inpatient appropriate because:Ongoing active pain requiring inpatient pain management, Ongoing diagnostic testing needed not appropriate for outpatient work up, Unsafe d/c plan, IV treatments appropriate due to intensity of illness or inability to take PO and Inpatient level of care appropriate due to severity of illness    Dispo: The patient is from: Home              Anticipated d/c is to: Home              Anticipated d/c date is: 3 days              Patient currently is not medically stable to d/c.        Procedures:   No admission procedures for hospital encounter.  ** Antimicrobials:  Anti-infectives (From admission, onward)   Start     Dose/Rate Route Frequency Ordered Stop   12/13/19 1800  vancomycin (VANCOCIN) 50 mg/mL oral solution 125 mg        125 mg Oral Every 6 hours 12/13/19 1704     12/13/19 1400  ceFEPIme (MAXIPIME) 2 g in sodium chloride 0.9 % 100 mL IVPB        2 g 200 mL/hr over 30 Minutes Intravenous Every 8 hours 12/13/19 0807     12/12/19 0800  ceFEPIme (MAXIPIME) 2 g in sodium chloride 0.9 % 100 mL IVPB  Status:  Discontinued        2 g 200 mL/hr over 30 Minutes Intravenous Every 12 hours 12/12/19 0246 12/13/19 0807   12/12/19 0400  metroNIDAZOLE (FLAGYL) IVPB 500 mg        500 mg 100 mL/hr over 60 Minutes Intravenous Every 8 hours 12/12/19 0246     12/12/19 0000   ceFEPIme (MAXIPIME) 1 g in sodium chloride 0.9 % 100 mL IVPB        1 g 200 mL/hr over 30 Minutes Intravenous  Once 12/11/19 2348 12/12/19 0112       Medication:  . acidophilus  1 capsule Oral TID  . aspirin EC  81 mg Oral Daily  . atorvastatin  20 mg Oral q1800  . canagliflozin  200 mg Oral QAC breakfast  . cholecalciferol  1,000 Units Oral Daily  . clopidogrel  75 mg Oral Daily  . diclofenac sodium  2 g Topical QID  . enoxaparin (LOVENOX) injection  40 mg Subcutaneous Q24H  . insulin aspart  0-20 Units Subcutaneous TID WC  . insulin aspart  0-5 Units Subcutaneous QHS  . labetalol  100 mg Oral BID  . linagliptin  5 mg Oral Daily  . magnesium oxide  400 mg Oral BID  . senna-docusate  1 tablet Oral QHS  . traZODone  50 mg Oral QHS  . vancomycin  125 mg Oral Q6H    acetaminophen **OR** acetaminophen, acetaminophen, albuterol, ondansetron **OR** ondansetron (ZOFRAN) IV, polyethylene glycol   Objective:   Vitals:   12/13/19 2008 12/14/19 0454 12/14/19 0840 12/14/19 0946  BP: 116/83 (!) 111/45 135/65 139/61  Pulse: 100 98 (!) 104 (!) 105  Resp: Temp: 99.1 F (37.3 C) 98.2 F (36.8 C) 97.9 F (36.6 C) 98.2 F (36.8 C)  TempSrc: Oral Oral Oral Oral  SpO2: 100% 96% 94% 96%  Weight:      Height:        Intake/Output Summary (Last 24 hours) at 12/14/2019 1126 Last data filed at 12/14/2019 0400 Gross per 24 hour  Intake 1200 ml  Output 200 ml  Net 1000 ml   Filed Weights   12/12/19 0817 12/12/19 2300  Weight: 99.8 kg 94.9 kg     Examination:       Physical Exam:   General:  Alert, oriented, cooperative, no distress;   HEENT:  Normocephalic, PERRL, otherwise with in Normal limits   Neuro:  CNII-XII intact. , normal motor and sensation, reflexes intact   Lungs:   Clear to auscultation BL, Respirations unlabored, no wheezes / crackles  Cardio:    S1/S2, RRR, No murmure, No Rubs or Gallops   Abdomen:   Soft, non-tender, bowel sounds active all four  quadrants,  no guarding or peritoneal signs.... Distended  Muscular skeletal:  Limited exam - in bed, able to move all 4 extremities, Normal strength,  2+ pulses,  symmetric, No pitting edema  Skin:  Dry, warm to touch, negative for any Rashes, No open wounds  Wounds: Please see nursing documentation Rectal tube in place -liquid stool noted         ------------------------------------------------------------------------------------------------------------------------------------------    LABs:  CBC Latest Ref Rng & Units 12/13/2019 12/13/2019 12/12/2019  WBC 4.0 - 10.5 K/uL 13.1(H) 15.1(H) 23.5(H)  Hemoglobin 12.0 - 15.0 g/dL 11.8(L) 12.1 12.7  Hematocrit 36 - 46 % 38.5 38.6 40.2  Platelets 150 - 400 K/uL 272 273 298   CMP Latest Ref Rng & Units 12/14/2019 12/13/2019 12/12/2019  Glucose 70 - 99 mg/dL 098(J) 191(Y) 782(N)  BUN 8 - 23 mg/dL 56(O) 13(Y) 23  Creatinine 0.44 - 1.00 mg/dL 8.65 7.84 6.96  Sodium 135 - 145 mmol/L 144 144 139  Potassium 3.5 - 5.1 mmol/L 4.0 4.1 4.6  Chloride 98 - 111 mmol/L 107 100 98  CO2 22 - 32 mmol/L Calcium 8.9 - 10.3 mg/dL 8.2(L) 8.5(L) 8.9  Total Protein 6.5 - 8.1 g/dL - 6.9 -  Total Bilirubin 0.3 - 1.2 mg/dL - 1.6(H) -  Alkaline Phos 38 - 126 U/L - 53 -  AST 15 - 41 U/L - 15 -  ALT 0 - 44 U/L - 19 -       Micro Results Recent Results (from the past 240 hour(s))  Urine culture     Status: None   Collection Time: 12/11/19 10:11 PM   Specimen: Urine, Random  Result Value Ref Range Status   Specimen Description   Final    URINE, RANDOM Performed at [facility], [address] [place], [place] [address]    Special Requests   Final    NONE Performed at [facility], [address], [place], [place] [address]  Culture   Final    NO GROWTH Performed at [facility], [address] [place], [place] [address]    Report Status 12/13/2019 FINAL  Final  Culture, blood (routine x 2)     Status: None  (Preliminary result)   Collection Time: 12/11/19 10:11 PM   Specimen: BLOOD RIGHT FOREARM  Result Value Ref Range Status   Specimen Description   Final    BLOOD RIGHT FOREARM Performed at [facility], [address], [place], [place] [address]    Special Requests   Final    BOTTLES DRAWN AEROBIC AND ANAEROBIC Blood Culture adequate volume   Culture   Final    NO GROWTH 3 DAYS Performed at [facility], [address] [place], [place] [address]    Report Status PENDING  Incomplete  Culture, blood (routine x 2)     Status: None (Preliminary result)   Collection Time: 12/11/19 10:12 PM   Specimen: BLOOD  Result Value Ref Range Status   Specimen Description BLOOD LEFT FA  Final   Special Requests   Final    BOTTLES DRAWN AEROBIC AND ANAEROBIC Blood Culture adequate volume   Culture   Final    NO GROWTH 3 DAYS Performed at [facility], [address] [place], [place] [address]    Report Status PENDING  Incomplete  Gastrointestinal Panel by PCR , Stool     Status: Abnormal   Collection Time: 12/11/19 10:12 PM   Specimen: Stool  Result Value Ref Range Status   Campylobacter species NOT DETECTED NOT DETECTED Final   Plesimonas shigelloides NOT DETECTED NOT DETECTED Final   Salmonella species NOT DETECTED NOT DETECTED Final   Yersinia enterocolitica NOT DETECTED NOT DETECTED Final   Vibrio species NOT DETECTED NOT DETECTED Final   Vibrio cholerae NOT DETECTED NOT DETECTED Final   Enteroaggregative E coli (EAEC) NOT DETECTED NOT DETECTED Final   Enteropathogenic E coli (EPEC) DETECTED (A) NOT DETECTED Final    Comment: RESULT CALLED TO, READ BACK BY AND VERIFIED WITH: ashton peters @0211  on 12/12/19 skl    Enterotoxigenic E coli (ETEC) NOT DETECTED NOT DETECTED Final   Shiga like toxin producing E coli (STEC) NOT DETECTED NOT DETECTED Final   Shigella/Enteroinvasive E coli (EIEC) NOT DETECTED NOT DETECTED Final   Cryptosporidium NOT DETECTED NOT  DETECTED Final   Cyclospora cayetanensis NOT DETECTED NOT DETECTED Final   Entamoeba histolytica NOT DETECTED NOT DETECTED Final   Giardia lamblia NOT DETECTED NOT DETECTED Final   Adenovirus F40/41 NOT DETECTED NOT DETECTED Final   Astrovirus NOT DETECTED NOT DETECTED Final   Norovirus GI/GII NOT DETECTED NOT DETECTED Final   Rotavirus A NOT DETECTED NOT DETECTED Final   Sapovirus (I, II, IV, and V) NOT DETECTED NOT DETECTED Final    Comment: Performed at [facility], [address] [place], [place]  SARS Coronavirus 2 by RT PCR (hospital order, performed in [facility] hospital lab) Nasopharyngeal Nasopharyngeal Swab     Status: None   Collection Time: 12/11/19 11:10 PM   Specimen: Nasopharyngeal Swab  Result Value Ref Range Status   SARS Coronavirus 2 NEGATIVE NEGATIVE Final    Comment: (NOTE) SARS-CoV-2 target nucleic acids are NOT DETECTED.  The SARS-CoV-2 RNA is generally detectable in upper and lower respiratory specimens during the acute phase of infection. The lowest concentration of SARS-CoV-2 viral copies this assay can detect is 250 copies / mL. A negative result does not  preclude SARS-CoV-2 infection and should not be used as the sole basis for treatment or other patient management decisions.  A negative result may occur with improper specimen collection / handling, submission of specimen other than nasopharyngeal swab, presence of viral mutation(s) within the areas targeted by this assay, and inadequate number of viral copies (<250 copies / mL). A negative result must be combined with clinical observations, patient history, and epidemiological information.  Fact Sheet for Patients:   BoilerBrush.com.cy  Fact Sheet for Healthcare Providers: https://pope.com/  This test is not yet approved or  cleared by the [place] FDA and has been authorized for detection and/or diagnosis of SARS-CoV-2  by FDA under an Emergency Use Authorization (EUA).  This EUA will remain in effect (meaning this test can be used) for the duration of the COVID-19 declaration under Section 564(b)(1) of the Act, 21 U.S.C. section 360bbb-3(b)(1), unless the authorization is terminated or revoked sooner.  Performed at [facility], [address] [place], [place] [address]   C Difficile Quick Screen w PCR reflex     Status: Abnormal   Collection Time: 12/13/19 11:10 AM   Specimen: STOOL  Result Value Ref Range Status   C Diff antigen POSITIVE (A) NEGATIVE Final   C Diff toxin NEGATIVE NEGATIVE Final   C Diff interpretation Results are indeterminate. See PCR results.  Final    Comment: Performed at [facility], [address] [place], [place] [address]  C. Diff by PCR, Reflexed     Status: Abnormal   Collection Time: 12/13/19 11:10 AM  Result Value Ref Range Status   Toxigenic C. Difficile by PCR POSITIVE (A) NEGATIVE Final    Comment: Positive for toxigenic C. difficile with little to no toxin production. Only treat if clinical presentation suggests symptomatic illness. Performed at [facility], [address] [place], [place] [address]     Radiology Reports CT ABDOMEN PELVIS W CONTRAST  Result Date: 12/11/2019 CLINICAL DATA:  Abdominal pain fever and distension EXAM: CT ABDOMEN AND PELVIS WITH CONTRAST TECHNIQUE: Multidetector CT imaging of the abdomen and pelvis was performed using the standard protocol following bolus administration of intravenous contrast. CONTRAST:  OMNIPAQUE IOHEXOL 300 MG/ML  SOLN COMPARISON:  None. FINDINGS: Lower chest: The visualized heart size within normal limits. No pericardial fluid/thickening. No hiatal hernia. The visualized portions of the lungs are clear. Hepatobiliary: The liver is normal in density without focal abnormality.The main portal vein is patent. Layering calcified gallstones are present. Pancreas: Unremarkable. No  pancreatic ductal dilatation or surrounding inflammatory changes. Spleen: Normal in size without focal abnormality. Adrenals/Urinary Tract: Both adrenal glands appear normal. Mild bilateral renal atrophy is seen. Bladder is unremarkable. Stomach/Bowel: The stomach and small bowel normal in appearance. There is a large amount of colonic stool seen throughout. Within the sigmoid rectal junction there is a large amount of stool with a rectal wall. Diffuse wall thickening is seen around the rectum with surrounding presacral fat stranding changes. There is also wall thickening seen around the anal canal. Vascular/Lymphatic: There are no enlarged mesenteric, retroperitoneal, or pelvic lymph nodes. Scattered aortic atherosclerotic calcifications are seen without aneurysmal dilatation. Reproductive: The uterus and adnexa are unremarkable. Other: No evidence of abdominal wall mass or hernia. Musculoskeletal: No acute or significant osseous findings. IMPRESSION: 1. Dilated sigmoid colon and rectum with a large amount of impacted with findings which could be suggestive of proctocolitis. However would recommend direct visualization upon resolution of symptoms to exclude underlying mass lesion.  2.  Aortic Atherosclerosis (ICD10-I70.0). Electronically Signed   By: Jonna ClarkBindu  Avutu M.D.   On: 12/11/2019 23:49   DG Chest Portable 1 View  Result Date: 12/12/2019 CLINICAL DATA:  Hypoxia EXAM: PORTABLE CHEST 1 VIEW COMPARISON:  03/08/2016 FINDINGS: Low lung volumes. Mild cardiomegaly. No consolidation or effusion. No pneumothorax. IMPRESSION: Low lung volumes with mild cardiomegaly. Electronically Signed   By: Jasmine PangKim  Fujinaga M.D.   On: 12/12/2019 00:02    SIGNED: Kendell BaneSeyed A Truth Wolaver, MD, FACP, FHM. [facility],  Pager (please use [email or web address] to page/text)  If 7PM-7AM, please contact night-coverage [email or web address], Password [email or web address] 12/14/2019, 11:26 AM

## 2019-12-14 NOTE — Evaluation (Signed)
Physical Therapy Evaluation Patient Details Name: VANETTE NOGUCHI MRN: [id number] DOB: Feb 17, 1946 Today's Date: 12/14/2019   History of Present Illness  Cordell Coke  is a 74 y.o. female  who presented to ED from SNF with acute onset of left lower quadrant as well as mid lower abdominal pain with associated constipation without nausea or vomiting or diarrhea.  Abdominal pelvic CT scan showed dilated sigmoid colon and rectum with large amount of impacted stools with findings suggestive of proctocolitis.  She had also showed aortic atherosclerosis.  The patient had blood and urine cultures sent. stools grew enterotoxigenic E. coli. PMH of CVA (with L sided residual deficits), seizures, DM, COPD, anxiety, HLD, HTN, L toe amputations.  Clinical Impression  Pt was pleasant but oriented only to self. Pt was able to follow simple commands but was unable to follow more complex commands. However, mentation decreased throughout the session and pt was discovered to be running a fever. Pt needed Max +2 for bed mobility and was unable to come to sitting EOB. Pt did not participate in trying to bring herself to sitting. Pt demonstrated little use of L side of the body especially the L UE. Pt will benefit from PT services in a SNF setting upon discharge to safely address deficits listed in patient problem list for decreased caregiver assistance and eventual return to PLOF.     Follow Up Recommendations SNF    Equipment Recommendations  Rolling walker with 5" wheels;Standard walker    Recommendations for Other Services       Precautions / Restrictions Precautions Precautions: Fall Restrictions Weight Bearing Restrictions: No Other Position/Activity Restrictions: Residual L sided weakness      Mobility  Bed Mobility Overal bed mobility: Needs Assistance Bed Mobility: Rolling;Supine to Sit Rolling: Max assist;+2 for physical assistance         General bed mobility comments: Attempted supine to  sit but pt was non-participatory and had little to no functional use of L UE; +2 assist for supine to sit  Transfers                 General transfer comment: Deferred. Pt unsafe/unable  Ambulation/Gait             General Gait Details: Pt unsafe/ unable  Stairs            Wheelchair Mobility    Modified Rankin (Stroke Patients Only)       Balance Overall balance assessment: Needs assistance   Sitting balance-Leahy Scale: Zero Sitting balance - Comments: attempted to sit EOB but pt unable to activate postural muscles for sitting Postural control: Posterior lean     Standing balance comment: Not tested.                             Pertinent Vitals/Pain Pain Assessment: Faces Faces Pain Scale: Hurts little more Pain Location: unspecified Pain Descriptors / Indicators: Grimacing;Moaning;Discomfort Pain Intervention(s): Limited activity within patient's tolerance;Monitored during session;Repositioned    Home Living Family/patient expects to be discharged to:: Skilled nursing facility               Home Equipment: Gilmer Mor - single point;Walker - standard;Bedside commode;Hand held shower head;Walker - 2 wheels;Grab bars - toilet;Grab bars - tub/shower      Prior Function Level of Independence: Needs assistance   Gait / Transfers Assistance Needed: pt stated she ambulates in home with RW  ADL's / Homemaking Assistance Needed: husband  assists with bed mobility, ADLs, performs meals, cooking, cleaning  Comments: Pt unable to provide information regarding PLOF. No family/caregiver present, per chart, pt was ambulating in the home with a RW ~2 months prior to initial admission, however some history also noted of consistent WC use at baseline. Per chart, husband assists with ADL management, bed mobility, etc.     Hand Dominance   Dominant Hand: Right    Extremity/Trunk Assessment                Communication   Communication:  Expressive difficulties;Receptive difficulties;Other (comment) (pt can follow simple commands)  Cognition Arousal/Alertness: Awake/alert Behavior During Therapy: WFL for tasks assessed/performed Overall Cognitive Status: History of cognitive impairments - at baseline                                 General Comments: No family/caregiver present to determine baseline. Pt oriented to self only. pt follows simple but not complex commands      General Comments      Exercises Other Exercises Other Exercises: Pt performs rolling L and R for linen and clothes changing. Pt cued on useing bed rails to help.   Assessment/Plan    PT Assessment Patient needs continued PT services  PT Problem List Decreased strength;Decreased safety awareness;Decreased mobility;Decreased knowledge of precautions;Decreased cognition;Decreased activity tolerance;Decreased balance;Decreased knowledge of use of DME       PT Treatment Interventions DME instruction;Therapeutic exercise;Balance training;Gait training;Functional mobility training;Therapeutic activities;Patient/family education    PT Goals (Current goals can be found in the Care Plan section)  Acute Rehab PT Goals PT Goal Formulation: Patient unable to participate in goal setting Time For Goal Achievement: 12/27/19 Potential to Achieve Goals: Poor    Frequency Min 2X/week   Barriers to discharge        Co-evaluation               AM-PAC PT "6 Clicks" Mobility  Outcome Measure Help needed turning from your back to your side while in a flat bed without using bedrails?: Total Help needed moving from lying on your back to sitting on the side of a flat bed without using bedrails?: Total Help needed moving to and from a bed to a chair (including a wheelchair)?: Total Help needed standing up from a chair using your arms (e.g., wheelchair or bedside chair)?: Total Help needed to walk in hospital room?: Total Help needed climbing  3-5 steps with a railing? : Total 6 Click Score: 6    End of Session Equipment Utilized During Treatment: Oxygen Activity Tolerance: Treatment limited secondary to medical complications (Comment) (pt demonstrated limited ability to understand and act on commands. Pt also developed a fever and became less responsive) Patient left: in bed;with nursing/sitter in room Nurse Communication: Mobility status PT Visit Diagnosis: Unsteadiness on feet (R26.81);Other abnormalities of gait and mobility (R26.89);Muscle weakness (generalized) (M62.81);Difficulty in walking, not elsewhere classified (R26.2)    Time: 7989-2119 PT Time Calculation (min) (ACUTE ONLY): 26 min   Charges:              Nicolette Bang, SPT 12/14/19. 1:12 PM

## 2019-12-14 NOTE — Progress Notes (Signed)
Rectal tube removed this morning per ID provider request

## 2019-12-15 DIAGNOSIS — A0472 Enterocolitis due to Clostridium difficile, not specified as recurrent: Secondary | ICD-10-CM

## 2019-12-15 LAB — BASIC METABOLIC PANEL
Anion gap: 9 (ref 5–15)
BUN: 20 mg/dL (ref 8–23)
CO2: 26 mmol/L (ref 22–32)
Calcium: 7.9 mg/dL — ABNORMAL LOW (ref 8.9–10.3)
Chloride: 107 mmol/L (ref 98–111)
Creatinine, Ser: 0.65 mg/dL (ref 0.44–1.00)
GFR calc Af Amer: >60 mL/min (ref >60)
GFR calc non Af Amer: >60 mL/min (ref >60)
Glucose, Bld: 180 mg/dL — ABNORMAL HIGH (ref 70–99)
Potassium: 3.8 mmol/L (ref 3.5–5.1)
Sodium: 142 mmol/L (ref 135–145)

## 2019-12-15 LAB — GLUCOSE, CAPILLARY
Glucose-Capillary: 150 mg/dL — ABNORMAL HIGH (ref 70–99)
Glucose-Capillary: 212 mg/dL — ABNORMAL HIGH (ref 70–99)
Glucose-Capillary: 204 mg/dL — ABNORMAL HIGH (ref 70–99)
Glucose-Capillary: 201 mg/dL — ABNORMAL HIGH (ref 70–99)

## 2019-12-15 LAB — CBC WITH DIFFERENTIAL/PLATELET
Abs Immature Granulocytes: 0.1 10*3/uL — ABNORMAL HIGH (ref 0.00–0.07)
Basophils Absolute: 0 10*3/uL (ref 0.0–0.1)
Basophils Relative: 0 %
Eosinophils Absolute: 0.2 10*3/uL (ref 0.0–0.5)
Eosinophils Relative: 2 %
HCT: 33.9 % — ABNORMAL LOW (ref 36.0–46.0)
Hemoglobin: 10.4 g/dL — ABNORMAL LOW (ref 12.0–15.0)
Immature Granulocytes: 1 %
Lymphocytes Relative: 13 %
Lymphs Abs: 1.3 10*3/uL (ref 0.7–4.0)
MCH: 28.2 pg (ref 26.0–34.0)
MCHC: 30.7 g/dL (ref 30.0–36.0)
MCV: 91.9 fL (ref 80.0–100.0)
Monocytes Absolute: 1.5 10*3/uL — ABNORMAL HIGH (ref 0.1–1.0)
Monocytes Relative: 16 %
Neutro Abs: 6.6 10*3/uL (ref 1.7–7.7)
Neutrophils Relative %: 68 %
Platelets: 254 10*3/uL (ref 150–400)
RBC: 3.69 MIL/uL — ABNORMAL LOW (ref 3.87–5.11)
RDW: 15.9 % — ABNORMAL HIGH (ref 11.5–15.5)
Smear Review: NORMAL
WBC: 9.8 10*3/uL (ref 4.0–10.5)
nRBC: 0 % (ref 0.0–0.2)

## 2019-12-15 NOTE — Progress Notes (Signed)
Occupational Therapy Treatment Patient Details Name: Molly [name] MRN: [id number] DOB: 1945-11-01 Today's Date: 12/15/2019    History of present illness Molly [name]  is a 74 y.o. female  who presented to ED from SNF with acute onset of left lower quadrant as well as mid lower abdominal pain with associated constipation without nausea or vomiting or diarrhea.  Abdominal pelvic CT scan showed dilated sigmoid colon and rectum with large amount of impacted stools with findings suggestive of proctocolitis.  She had also showed aortic atherosclerosis.  The patient had blood and urine cultures sent. stools grew enterotoxigenic E. coli. PMH of CVA (with L sided residual deficits), seizures, DM, COPD, anxiety, HLD, HTN, L toe amputations.   OT comments  Upon entering the room, pt supine in bed with husband present. Pt is very pleasant but also very tangential with speech and having visual hallucinations in room. Pt is oriented to self only. Supine >sit towards the L with max A to EOB. Pt initially needing max A for sitting balance and progressing to mod A. Pt exhibits L lateral lean but shifts weight to R side with cuing and grabs onto R bed rails to attempt to hold herself up. Pt sitting with max A for sitting balance while she attempts to comb hair. Pt fatigues very quickly and requests to return to bed after ~ 7 minutes. Total A for sit >supine and repositioning in bed. Caregiver reports her cognition is much worse from baseline and she was able to stand and take a few steps before going to the SNF about 2 months ago. Pt would continue to benefit from acute OT intervention with recommendation for SNF at discharge to address functional deficits.   Follow Up Recommendations  SNF;Supervision/Assistance - 24 hour    Equipment Recommendations  Other (comment) (to be determine at next venue of care)       Precautions / Restrictions Precautions Precautions: Fall       Mobility Bed Mobility Overal  bed mobility: Needs Assistance Bed Mobility: Rolling;Supine to Sit;Sit to Supine Rolling: Max assist   Supine to sit: Max assist Sit to supine: Total assist   General bed mobility comments: pt able to follow 1 step commands for hand placement with increased time  Transfers     General transfer comment: Deferred. Pt unsafe/unable    Balance Overall balance assessment: Needs assistance   Sitting balance-Leahy Scale: Poor Sitting balance - Comments: L lateral lean and as she fatigues anterior lean as well       ADL either performed or assessed with clinical judgement        Vision Baseline Vision/History: Wears glasses Wears Glasses: At all times            Cognition Arousal/Alertness: Awake/alert Behavior During Therapy: WFL for tasks assessed/performed Overall Cognitive Status: History of cognitive impairments - at baseline      General Comments: Her husband is present and reports she has baseline cognitive deficits but it is "much worse" right now                   Pertinent Vitals/ Pain       Pain Assessment: Faces Faces Pain Scale: No hurt         Frequency  Min 1X/week        Progress Toward Goals  OT Goals(current goals can now be found in the care plan section)  Progress towards OT goals: Progressing toward goals  Acute Rehab OT Goals Patient Stated  Goal: To go home OT Goal Formulation: With patient/family Time For Goal Achievement: 12/27/19 Potential to Achieve Goals: Fair  Plan Discharge plan remains appropriate;Frequency needs to be updated       AM-PAC OT "6 Clicks" Daily Activity     Outcome Measure   Help from another person eating meals?: A Lot Help from another person taking care of personal grooming?: A Lot Help from another person toileting, which includes using toliet, bedpan, or urinal?: Total Help from another person bathing (including washing, rinsing, drying)?: Total Help from another person to put on and taking off  regular upper body clothing?: A Lot Help from another person to put on and taking off regular lower body clothing?: Total 6 Click Score: 9    End of Session Equipment Utilized During Treatment: Oxygen  OT Visit Diagnosis: Other abnormalities of gait and mobility (R26.89);Muscle weakness (generalized) (M62.81)   Activity Tolerance Patient tolerated treatment well   Patient Left in bed;with call bell/phone within reach;with bed alarm set;with family/visitor present           Time: 3710-6269 OT Time Calculation (min): 23 min  Charges: OT General Charges $OT Visit: 1 Visit OT Treatments $Self Care/Home Management : 8-22 mins $Therapeutic Activity: 8-22 mins  Jackquline Denmark, MS, OTR/L , CBIS ascom [phone]  12/15/19, 3:59 PM

## 2019-12-15 NOTE — Progress Notes (Signed)
PROGRESS NOTE    Patient: Molly [name]                            PCP: Ethelda Chick, MD                    DOB: 1945-03-31            DOA: 12/11/2019 IOE:[id number]             DOS: 12/15/2019, 12:47 PM   LOS: 3 days   Date of Service: The patient was seen and examined on 12/15/2019  Subjective:   The patient was seen and examined this morning, stable no acute distress Diarrhea has been improving Rectal tube has been removed  Brief Narrative:   Molly [name]  is a 74 y.o. female with a known history of COPD, type 2 diabetes mellitus, hypertension, dyslipidemia and anxiety, who presented to the emergency room with acute onset of left lower quadrant as well as mid lower abdominal pain with associated constipation without nausea or vomiting or diarrhea.  She had no fever or chills at home.  No chest pain or dyspnea or cough or wheezing.  No dysuria, oliguria or hematuria or flank pain.  Upon presentation to the emergency room, temperature was 101.7 with a heart rate of 118 and pulse symmetry of 89% on room air 90% on 2 L of O2 by nasal cannula with a blood pressure 114/57 and respiratory rate of 19.  Labs revealed a blood glucose of 90-92 and procalcitonin of 7.51 with lactic acid 1.1 and high-sensitivity troponin I of 8.  CBC shows significant leukocytosis 23.5.  COVID-19 PCR came back negative. Abdominal pelvic CT scan showed dilated sigmoid colon and rectum with large amount of impacted stools with findings suggestive of proctocolitis.  She had also showed aortic atherosclerosis.  The patient had blood and urine cultures sent.  His stools grew enterotoxigenic E. coli.   The patient was given IV cefepime and Tylenol.  She will be admitted to a medically monitored bed for further evaluation and management   Assessment & Plan:   Principal Problem:   C. difficile colitis Active Problems:   Diabetes mellitus (HCC)   COPD (chronic obstructive pulmonary disease) (HCC)   HTN  (hypertension)   HLD (hyperlipidemia)   Sepsis (HCC)   Acute colitis  C. difficile colitis -Continues to have loose stool, diarrhea, improving -Rectal tube in place---has been removed 12/14/2019 -Continue with fluid hydration -We will continue with IV Flagyl, p.o. vancomycin  Per infectious disease recommendation; discontinue rectal tube for proctocolitis, Discontinuing IV ceftriaxone-no treatment needed for enterotoxic E. coli-colitis   Acute proctocolitis secondary to enterotoxigenic E. Coli./History of ulcerative colitis Complicated by chronic constipation -treated at times with enema/laxative Previous history of C. difficile colitis --?  Colonization -ID was consulted recommended continue current treatment due to fever, meeting sepsis criteria -We will continue IV Flagyl, p.o. vancomycin -Discontinuing IV cefepime -Status post IV fluid hydration -Remained hemodynamically stable  -Contact precautions will be followed.   Sepsis  -without severe sepsis or septic shock secondary to acute proctocolitis.   This is manifested by significant leuko-cytosis as well as tachycardia, fever -Hemodynamically stable, still tachypneic, but afebrile, hypertensive, satting 100% on room air -Improved sepsis physiology with exception of fever yesterday 12/14/2019 -Blood cultures remain negative to date -Stoole positive for Enterotoxic E. Coli- -stool positive for C. difficile  -Improved leukocytosis, 23.5, 15.1, 13.1 >> 9.8 today -  Procalcitonin 7.51, Infectious disease team consulted recommended discontinuing cefepime, continuing IV Flagyl and p.o. vancomycin    Loose/watery stool -Improving rectal tube DC'd  Type 2 diabetes mellitus. -CBG QA CHS, SSI coverage continue his basal coverage.  -We will continue Januvia. -Blood sugars remained stable   Dyslipidemia. -Statin therapy will be continued -Stable  Debility/generalized weaknesses PT/OT consult --- recommending  SNF   Nutritional status:         Cultures; Blood Cultures x 2 >> NGT Stool GI panel PCR : enterotoxigenic E. coli Stool C. difficile-+ 12/13/2019  Antimicrobials: 12/11/2019 IV cefepime >> 12/14/2019 -12/12/2018 IV Flagyl >>> -12/13/2019 p.o. vancomycin    Consultants: None   ------------------------------------------------------------------------------------------------------------------------------------------------  DVT prophylaxis:  SCD/Compression stockings and Lovenox SQ Code Status:   Code Status: Full Code Family Communication: No family member present at bedside- The above findings and plan of care has been discussed with patient (and family )  in detail,  they expressed understanding and agreement of above. -Advance care planning has been discussed.   Admission status:    Status is: Inpatient  Remains inpatient appropriate because:Ongoing active pain requiring inpatient pain management, Ongoing diagnostic testing needed not appropriate for outpatient work up, Unsafe d/c plan, IV treatments appropriate due to intensity of illness or inability to take PO and Inpatient level of care appropriate due to severity of illness    Dispo: The patient is from: Home              Anticipated d/c is to: SNF in next 2-3 days (pending improved stooling, improved symptoms)              Anticipated d/c date is: 3 days              Patient currently is not medically stable to d/c.        Procedures:   No admission procedures for hospital encounter.  Antimicrobials:  Anti-infectives (From admission, onward)   Start     Dose/Rate Route Frequency Ordered Stop   12/14/19 1200  metroNIDAZOLE (FLAGYL) IVPB 500 mg        500 mg 100 mL/hr over 60 Minutes Intravenous Every 8 hours 12/14/19 1146     12/13/19 1800  vancomycin (VANCOCIN) 50 mg/mL oral solution 125 mg        125 mg Oral Every 6 hours 12/13/19 1704     12/13/19 1400  ceFEPIme (MAXIPIME) 2 g in sodium chloride 0.9  % 100 mL IVPB  Status:  Discontinued        2 g 200 mL/hr over 30 Minutes Intravenous Every 8 hours 12/13/19 0807 12/14/19 1129   12/12/19 0800  ceFEPIme (MAXIPIME) 2 g in sodium chloride 0.9 % 100 mL IVPB  Status:  Discontinued        2 g 200 mL/hr over 30 Minutes Intravenous Every 12 hours 12/12/19 0246 12/13/19 0807   12/12/19 0400  metroNIDAZOLE (FLAGYL) IVPB 500 mg  Status:  Discontinued        500 mg 100 mL/hr over 60 Minutes Intravenous Every 8 hours 12/12/19 0246 12/14/19 1129   12/12/19 0000  ceFEPIme (MAXIPIME) 1 g in sodium chloride 0.9 % 100 mL IVPB        1 g 200 mL/hr over 30 Minutes Intravenous  Once 12/11/19 2348 12/12/19 0112       Medication:  . acidophilus  1 capsule Oral TID  . aspirin EC  81 mg Oral Daily  . atorvastatin  20 mg Oral q1800  .  canagliflozin  200 mg Oral QAC breakfast  . cholecalciferol  1,000 Units Oral Daily  . clopidogrel  75 mg Oral Daily  . diclofenac sodium  2 g Topical QID  . enoxaparin (LOVENOX) injection  40 mg Subcutaneous Q24H  . insulin aspart  0-20 Units Subcutaneous TID WC  . insulin aspart  0-5 Units Subcutaneous QHS  . labetalol  100 mg Oral BID  . linagliptin  5 mg Oral Daily  . traZODone  50 mg Oral QHS  . vancomycin  125 mg Oral Q6H    acetaminophen **OR** acetaminophen, acetaminophen, albuterol, ondansetron **OR** ondansetron (ZOFRAN) IV, polyethylene glycol   Objective:   Vitals:   12/14/19 2000 12/14/19 2003 12/15/19 0614 12/15/19 1139  BP:  119/62 132/67 127/81  Pulse:  91 97 89  Resp: Temp:  99.1 F (37.3 C) 98.2 F (36.8 C) 98.8 F (37.1 C)  TempSrc:  Oral Oral Oral  SpO2:  97% 92% 97%  Weight:      Height:        Intake/Output Summary (Last 24 hours) at 12/15/2019 1247 Last data filed at 12/15/2019 0614 Gross per 24 hour  Intake 1100 ml  Output --  Net 1100 ml   Filed Weights   12/12/19 0817 12/12/19 2300  Weight: 99.8 kg 94.9 kg     Examination:         Physical Exam:    General:  Alert, oriented, cooperative, no distress;   HEENT:  Normocephalic, PERRL, otherwise with in Normal limits   Neuro:  CNII-XII intact. , normal motor and sensation, reflexes intact   Lungs:   Clear to auscultation BL, Respirations unlabored, no wheezes / crackles  Cardio:    S1/S2, RRR, No murmure, No Rubs or Gallops   Abdomen:   Soft, non-tender, bowel sounds active all four quadrants,  no guarding or peritoneal signs.  Muscular skeletal:   Generalized bilateral severe weaknesses Limited exam - in bed, able to move all 4 extremities, Normal strength,  2+ pulses,  symmetric, No pitting edema  Skin:  Dry, warm to touch, negative for any Rashes, No open wounds  Wounds: Please see nursing documentation                 ------------------------------------------------------------------------------------------------------------------------------------------    LABs:  CBC Latest Ref Rng & Units 12/15/2019 12/14/2019 12/13/2019  WBC 4.0 - 10.5 K/uL 9.8 11.4(H) 13.1(H)  Hemoglobin 12.0 - 15.0 g/dL 10.4(L) 10.4(L) 11.8(L)  Hematocrit 36 - 46 % 33.9(L) 34.0(L) 38.5  Platelets 150 - 400 K/uL 254 258 272   CMP Latest Ref Rng & Units 12/15/2019 12/14/2019 12/13/2019  Glucose 70 - 99 mg/dL 191(Y) 782(N) 562(Z)  BUN 8 - 23 mg/dL 20 30(Q) 65(H)  Creatinine 0.44 - 1.00 mg/dL 8.46 9.62 9.52  Sodium 135 - 145 mmol/L 142 144 144  Potassium 3.5 - 5.1 mmol/L 3.8 4.0 4.1  Chloride 98 - 111 mmol/L 107 107 100  CO2 22 - 32 mmol/L Calcium 8.9 - 10.3 mg/dL 7.9(L) 8.2(L) 8.5(L)  Total Protein 6.5 - 8.1 g/dL - - 6.9  Total Bilirubin 0.3 - 1.2 mg/dL - - 1.6(H)  Alkaline Phos 38 - 126 U/L - - 53  AST 15 - 41 U/L - - 15  ALT 0 - 44 U/L - - 19       Micro Results Recent Results (from the past 240 hour(s))  Urine culture     Status: None   Collection  Time: 12/11/19 10:11 PM   Specimen: Urine, Random  Result Value Ref Range Status   Specimen Description   Final    URINE,  RANDOM Performed at [facility], [address] [place], [place] [address]    Special Requests   Final    NONE Performed at [facility], [address] [place], [place] [address]    Culture   Final    NO GROWTH Performed at [facility], [address] [place], [place] [address]    Report Status 12/13/2019 FINAL  Final  Culture, blood (routine x 2)     Status: None (Preliminary result)   Collection Time: 12/11/19 10:11 PM   Specimen: BLOOD RIGHT FOREARM  Result Value Ref Range Status   Specimen Description   Final    BLOOD RIGHT FOREARM Performed at [facility], [address] [place], [place] [address]    Special Requests   Final    BOTTLES DRAWN AEROBIC AND ANAEROBIC Blood Culture adequate volume   Culture   Final    NO GROWTH 4 DAYS Performed at [facility], [address] [place], [place] [address]    Report Status PENDING  Incomplete  Culture, blood (routine x 2)     Status: None (Preliminary result)   Collection Time: 12/11/19 10:12 PM   Specimen: BLOOD  Result Value Ref Range Status   Specimen Description BLOOD LEFT FA  Final   Special Requests   Final    BOTTLES DRAWN AEROBIC AND ANAEROBIC Blood Culture adequate volume   Culture   Final    NO GROWTH 4 DAYS Performed at [facility], [address] [place], [place] [address]    Report Status PENDING  Incomplete  Gastrointestinal Panel by PCR , Stool     Status: Abnormal   Collection Time: 12/11/19 10:12 PM   Specimen: Stool  Result Value Ref Range Status   Campylobacter species NOT DETECTED NOT DETECTED Final   Plesimonas shigelloides NOT DETECTED NOT DETECTED Final   Salmonella species NOT DETECTED NOT DETECTED Final   Yersinia enterocolitica NOT DETECTED NOT DETECTED Final   Vibrio species NOT DETECTED NOT DETECTED Final   Vibrio cholerae NOT DETECTED NOT DETECTED Final   Enteroaggregative E coli (EAEC) NOT DETECTED NOT DETECTED Final    Enteropathogenic E coli (EPEC) DETECTED (A) NOT DETECTED Final    Comment: RESULT CALLED TO, READ BACK BY AND VERIFIED WITH: ashton peters @0211  on 12/12/19 skl    Enterotoxigenic E coli (ETEC) NOT DETECTED NOT DETECTED Final   Shiga like toxin producing E coli (STEC) NOT DETECTED NOT DETECTED Final   Shigella/Enteroinvasive E coli (EIEC) NOT DETECTED NOT DETECTED Final   Cryptosporidium NOT DETECTED NOT DETECTED Final   Cyclospora cayetanensis NOT DETECTED NOT DETECTED Final   Entamoeba histolytica NOT DETECTED NOT DETECTED Final   Giardia lamblia NOT DETECTED NOT DETECTED Final   Adenovirus F40/41 NOT DETECTED NOT DETECTED Final   Astrovirus NOT DETECTED NOT DETECTED Final   Norovirus GI/GII NOT DETECTED NOT DETECTED Final   Rotavirus A NOT DETECTED NOT DETECTED Final   Sapovirus (I, II, IV, and V) NOT DETECTED NOT DETECTED Final    Comment: Performed at [facility], [address] [place], [place] [address]  SARS Coronavirus 2 by RT PCR (hospital order, performed in [facility] hospital lab) Nasopharyngeal Nasopharyngeal Swab     Status: None   Collection Time: 12/11/19 11:10 PM   Specimen: Nasopharyngeal Swab  Result Value Ref Range Status   SARS Coronavirus 2 NEGATIVE NEGATIVE Final    Comment: (NOTE) SARS-CoV-2 target nucleic acids are NOT DETECTED.  The SARS-CoV-2 RNA is generally detectable in upper and lower respiratory specimens during the acute phase of infection. The lowest concentration of SARS-CoV-2 viral copies this assay can detect is 250 copies / mL. A negative result does not preclude SARS-CoV-2 infection and should not be used as the sole basis for treatment or other patient management decisions.  A negative result may occur with improper specimen collection / handling, submission of specimen other than nasopharyngeal swab, presence of viral mutation(s) within the areas targeted by this assay, and inadequate number of viral copies (<250 copies / mL). A  negative result must be combined with clinical observations, patient history, and epidemiological information.  Fact Sheet for Patients:   BoilerBrush.com.cy  Fact Sheet for Healthcare Providers: https://pope.com/  This test is not yet approved or  cleared by the [place] FDA and has been authorized for detection and/or diagnosis of SARS-CoV-2 by FDA under an Emergency Use Authorization (EUA).  This EUA will remain in effect (meaning this test can be used) for the duration of the COVID-19 declaration under Section 564(b)(1) of the Act, 21 U.S.C. section 360bbb-3(b)(1), unless the authorization is terminated or revoked sooner.  Performed at [facility], [address] [place], [place] [address]   C Difficile Quick Screen w PCR reflex     Status: Abnormal   Collection Time: 12/13/19 11:10 AM   Specimen: STOOL  Result Value Ref Range Status   C Diff antigen POSITIVE (A) NEGATIVE Final   C Diff toxin NEGATIVE NEGATIVE Final   C Diff interpretation Results are indeterminate. See PCR results.  Final    Comment: Performed at [facility], [address] [place], [place] [address]  C. Diff by PCR, Reflexed     Status: Abnormal   Collection Time: 12/13/19 11:10 AM  Result Value Ref Range Status   Toxigenic C. Difficile by PCR POSITIVE (A) NEGATIVE Final    Comment: Positive for toxigenic C. difficile with little to no toxin production. Only treat if clinical presentation suggests symptomatic illness. Performed at [facility], [address] [place], [place] [address]   CULTURE, BLOOD (ROUTINE X 2) w Reflex to ID Panel     Status: None (Preliminary result)   Collection Time: 12/14/19 11:51 AM   Specimen: BLOOD  Result Value Ref Range Status   Specimen Description BLOOD RIGHT ANTECUBITAL  Final   Special Requests   Final    BOTTLES DRAWN AEROBIC AND ANAEROBIC Blood Culture results may not be optimal  due to an excessive volume of blood received in culture bottles   Culture   Final    NO GROWTH < 24 HOURS Performed at [facility], [address] [place], [place] [address]    Report Status PENDING  Incomplete  CULTURE, BLOOD (ROUTINE X 2) w Reflex to ID Panel     Status: None (Preliminary result)   Collection Time: 12/14/19 11:57 AM   Specimen: BLOOD  Result Value Ref Range Status   Specimen Description BLOOD BLOOD RIGHT HAND  Final   Special Requests   Final    BOTTLES DRAWN AEROBIC AND ANAEROBIC Blood Culture adequate volume   Culture   Final    NO GROWTH < 24 HOURS Performed at [facility], [address] [place], [place] [address]    Report Status PENDING  Incomplete  Radiology Reports CT ABDOMEN PELVIS W CONTRAST  Result Date: 12/11/2019 CLINICAL DATA:  Abdominal pain fever and distension EXAM: CT ABDOMEN AND PELVIS WITH CONTRAST TECHNIQUE: Multidetector CT imaging of the abdomen and pelvis was performed using the standard protocol following bolus administration of intravenous contrast. CONTRAST:  OMNIPAQUE IOHEXOL 300 MG/ML  SOLN COMPARISON:  None. FINDINGS: Lower chest: The visualized heart size within normal limits. No pericardial fluid/thickening. No hiatal hernia. The visualized portions of the lungs are clear. Hepatobiliary: The liver is normal in density without focal abnormality.The main portal vein is patent. Layering calcified gallstones are present. Pancreas: Unremarkable. No pancreatic ductal dilatation or surrounding inflammatory changes. Spleen: Normal in size without focal abnormality. Adrenals/Urinary Tract: Both adrenal glands appear normal. Mild bilateral renal atrophy is seen. Bladder is unremarkable. Stomach/Bowel: The stomach and small bowel normal in appearance. There is a large amount of colonic stool seen throughout. Within the sigmoid rectal junction there is a large amount of stool with a rectal wall. Diffuse wall thickening is  seen around the rectum with surrounding presacral fat stranding changes. There is also wall thickening seen around the anal canal. Vascular/Lymphatic: There are no enlarged mesenteric, retroperitoneal, or pelvic lymph nodes. Scattered aortic atherosclerotic calcifications are seen without aneurysmal dilatation. Reproductive: The uterus and adnexa are unremarkable. Other: No evidence of abdominal wall mass or hernia. Musculoskeletal: No acute or significant osseous findings. IMPRESSION: 1. Dilated sigmoid colon and rectum with a large amount of impacted with findings which could be suggestive of proctocolitis. However would recommend direct visualization upon resolution of symptoms to exclude underlying mass lesion. 2.  Aortic Atherosclerosis (ICD10-I70.0). Electronically Signed   By: Jonna Clark M.D.   On: 12/11/2019 23:49   DG Chest Portable 1 View  Result Date: 12/12/2019 CLINICAL DATA:  Hypoxia EXAM: PORTABLE CHEST 1 VIEW COMPARISON:  03/08/2016 FINDINGS: Low lung volumes. Mild cardiomegaly. No consolidation or effusion. No pneumothorax. IMPRESSION: Low lung volumes with mild cardiomegaly. Electronically Signed   By: Jasmine Pang M.D.   On: 12/12/2019 00:02    SIGNED: Kendell Bane, MD, FACP, FHM. [facility],  Pager (please use [email or web address] to page/text)  If 7PM-7AM, please contact night-coverage [email or web address] 12/15/2019, 12:47 PM

## 2019-12-15 NOTE — Progress Notes (Signed)
ID Pt doing better Says abdominal pain better No fever Husband at bedside Patient Vitals for the past 24 hrs:  BP Temp Temp src Pulse Resp SpO2  12/15/19 1139 127/81 98.8 F (37.1 C) Oral 89 16 97 %  12/15/19 0614 132/67 98.2 F (36.8 C) Oral 97 20 92 %  12/14/19 2003 119/62 99.1 F (37.3 C) Oral 91 -- 97 %  12/14/19 2000 -- -- -- -- 16 --   Awake Slurred speech Left hemiparesis Chest b/l air entry abd -soft distension  Labs CBC Latest Ref Rng & Units 12/15/2019 12/14/2019 12/13/2019  WBC 4.0 - 10.5 K/uL 9.8 11.4(H) 13.1(H)  Hemoglobin 12.0 - 15.0 g/dL 10.4(L) 10.4(L) 11.8(L)  Hematocrit 36 - 46 % 33.9(L) 34.0(L) 38.5  Platelets 150 - 400 K/uL 254 258 272   CMP Latest Ref Rng & Units 12/15/2019 12/14/2019 12/13/2019  Glucose 70 - 99 mg/dL 979(G) 921(J) 941(D)  BUN 8 - 23 mg/dL 20 40(C) 14(G)  Creatinine 0.44 - 1.00 mg/dL 8.18 5.63 1.49  Sodium 135 - 145 mmol/L 142 144 144  Potassium 3.5 - 5.1 mmol/L 3.8 4.0 4.1  Chloride 98 - 111 mmol/L 107 107 100  CO2 22 - 32 mmol/L 26 25 28   Calcium 8.9 - 10.3 mg/dL 7.9(L) 8.2(L) 8.5(L)  Total Protein 6.5 - 8.1 g/dL - - 6.9  Total Bilirubin 0.3 - 1.2 mg/dL - - 1.6(H)  Alkaline Phos 38 - 126 U/L - - 53  AST 15 - 41 U/L - - 15  ALT 0 - 44 U/L - - 19     Impression/Recommendation ?pt admitted with constipation and abdominal pain CT abdomen colon full of stool and proctocolitis cdiff positive and enteropathogenic ecoli positive Had fever and leucocytosis which improved with cefepime/flagyl/vanco- cefepime Dc on 12/14/19  colonoscopy in Jan 2018-proctocolitis seen in Jan 2018 and biopsy was active inflammation She has ulcerative colitis and also chronic constipation causing diarrgea and stool positive for cdiff antigen/PCR positive So she is likely colonized but okay to treat because of fever and leucocytosis Will not treat the e.coli Continue IV flagyl avoid rectal tube as risk for perforation /bleeding Recommend GI consult  CVA-  left hemiplegia  DM on insulin and canagliflozin  HTN on labetolol ? ?Discussed with her and her husband- ID will follow her peripherally this weekend- call if needed

## 2019-12-15 NOTE — Evaluation (Signed)
Clinical/Bedside Swallow Evaluation Patient Details  Name: Molly [name] MRN: [id number] Date of Birth: 08-16-1945  Today's Date: 12/15/2019 Time: SLP Start Time (ACUTE ONLY): 1025 SLP Stop Time (ACUTE ONLY): 1106 SLP Time Calculation (min) (ACUTE ONLY): 41 min  Past Medical History:  Past Medical History:  Diagnosis Date  . Anxiety   . COPD (chronic obstructive pulmonary disease) (HCC)   . Diabetes (HCC)   . Hemorrhoid   . HLD (hyperlipidemia)   . HTN (hypertension)   . Stroke [facility]) 2008   left weakness   Past Surgical History:  Past Surgical History:  Procedure Laterality Date  . AMPUTATION Left 05/22/2014   Procedure: AMPUTATION RAY LEFT FOURTH AND FIFTH ;  Surgeon: Toni Arthurs, MD;  Location: [facility] OR;  Service: Orthopedics;  Laterality: Left;  . CATARACT EXTRACTION    . COLONOSCOPY  2004  . COLONOSCOPY WITH PROPOFOL N/A 04/29/2016   Procedure: COLONOSCOPY WITH PROPOFOL;  Surgeon: Wyline Mood, MD;  Location[facility] ENDOSCOPY;  Service: Gastroenterology;  Laterality: N/A;  . ESOPHAGOGASTRODUODENOSCOPY (EGD) WITH PROPOFOL N/A 04/27/2016   Procedure: ESOPHAGOGASTRODUODENOSCOPY (EGD) WITH PROPOFOL;  Surgeon: Midge Minium, MD;  Location: [facility] ENDOSCOPY;  Service: Endoscopy;  Laterality: N/A;   HPI:  Per admitting H&P :"Molly [name]  is a 74 y.o. female with a known history of COPD, type 2 diabetes mellitus, hypertension, dyslipidemia and anxiety, who presented to the emergency room with acute onset of left lower quadrant as well as mid lower abdominal pain with associated constipation without nausea or vomiting or diarrhea.  She had no fever or chills at home.  No chest pain or dyspnea or cough or wheezing.  No dysuria, oliguria or hematuria or flank pain."   Assessment / Plan / Recommendation Clinical Impression  Pt presents with moderate oral dysphagia. Pt tolerated all consistencies without s/s of aspiration with the exception of multiple sips of thin liquid by straw for which she  presented with a cough x1. Given cues to take small single sips, Pt had no difficulty. When given a cup she often had difficulty controlling the amount coming out of the cup and therefore had anterior spillage. Significant oral transit delay with solids as well as moderate oral residue after the swallow. Pt was able to eventually clear the oral residue given several single sips of thin liquid. Vocal quality remained clear, laryngeal elevation appeared adequate. Rec Dysphagia 2 diet with pureed meats. Meds to be given crushed in applesauce. ST to follow up with toleration of diet and adjust as needed. SLP Visit Diagnosis: Dysphagia, oropharyngeal phase (R13.12)    Aspiration Risk  Mild aspiration risk    Diet Recommendation Dysphagia 2 (Fine chop)   Liquid Administration via: Straw;Other (Comment) (SINGLE SIPS) Medication Administration: Crushed with puree Supervision: Full supervision/cueing for compensatory strategies Compensations: Slow rate;Small sips/bites;Minimize environmental distractions Postural Changes: Seated upright at 90 degrees;Remain upright for at least 30 minutes after po intake    Other  Recommendations     Follow up Recommendations Skilled Nursing facility      Frequency and Duration min 2x/week          Prognosis Prognosis for Safe Diet Advancement: Fair      Swallow Study   General Date of Onset: 12/11/19 HPI: Per admitting H&P :"Molly [name]  is a 74 y.o. female with a known history of COPD, type 2 diabetes mellitus, hypertension, dyslipidemia and anxiety, who presented to the emergency room with acute onset of left lower quadrant as well as mid lower abdominal  pain with associated constipation without nausea or vomiting or diarrhea.  She had no fever or chills at home.  No chest pain or dyspnea or cough or wheezing.  No dysuria, oliguria or hematuria or flank pain." Type of Study: Bedside Swallow Evaluation Diet Prior to this Study: Regular Temperature Spikes  Noted: No Respiratory Status: Room air History of Recent Intubation: No Behavior/Cognition: Cooperative;Lethargic/Drowsy;Requires cueing Oral Cavity Assessment: Dry Oral Cavity - Dentition: Missing dentition Vision: Impaired for self-feeding Self-Feeding Abilities: Needs assist;Needs set up;Total assist Patient Positioning: Upright in bed Baseline Vocal Quality: Low vocal intensity Volitional Cough: Strong Volitional Swallow: Able to elicit    Oral/Motor/Sensory Function     Ice Chips Ice chips: Within functional limits Presentation: Spoon   Thin Liquid Thin Liquid: Within functional limits Presentation: Straw;Cup;Spoon    Nectar Thick Nectar Thick Liquid: Not tested   Honey Thick     Puree Puree: Within functional limits Presentation: Spoon   Solid     Solid: Impaired Presentation: Self Fed Oral Phase Impairments: Impaired mastication;Poor awareness of bolus;Reduced lingual movement/coordination Oral Phase Functional Implications: Prolonged oral transit;Impaired mastication;Oral residue      Eather Colas 12/15/2019,11:07 AM

## 2019-12-16 LAB — CULTURE, BLOOD (ROUTINE X 2)
Culture: NO GROWTH
Culture: NO GROWTH
Special Requests: ADEQUATE
Special Requests: ADEQUATE

## 2019-12-16 LAB — BASIC METABOLIC PANEL
Anion gap: 9 (ref 5–15)
BUN: 15 mg/dL (ref 8–23)
CO2: 26 mmol/L (ref 22–32)
Calcium: 8.1 mg/dL — ABNORMAL LOW (ref 8.9–10.3)
Chloride: 109 mmol/L (ref 98–111)
Creatinine, Ser: 0.71 mg/dL (ref 0.44–1.00)
GFR calc Af Amer: >60 mL/min (ref >60)
GFR calc non Af Amer: >60 mL/min (ref >60)
Glucose, Bld: 164 mg/dL — ABNORMAL HIGH (ref 70–99)
Potassium: 3.9 mmol/L (ref 3.5–5.1)
Sodium: 144 mmol/L (ref 135–145)

## 2019-12-16 LAB — CBC WITH DIFFERENTIAL/PLATELET
Abs Immature Granulocytes: 0.48 10*3/uL — ABNORMAL HIGH (ref 0.00–0.07)
Basophils Absolute: 0.1 10*3/uL (ref 0.0–0.1)
Basophils Relative: 1 %
Eosinophils Absolute: 0.2 10*3/uL (ref 0.0–0.5)
Eosinophils Relative: 2 %
HCT: 33.8 % — ABNORMAL LOW (ref 36.0–46.0)
Hemoglobin: 10.2 g/dL — ABNORMAL LOW (ref 12.0–15.0)
Immature Granulocytes: 5 %
Lymphocytes Relative: 13 %
Lymphs Abs: 1.3 10*3/uL (ref 0.7–4.0)
MCH: 28.5 pg (ref 26.0–34.0)
MCHC: 30.2 g/dL (ref 30.0–36.0)
MCV: 94.4 fL (ref 80.0–100.0)
Monocytes Absolute: 1.5 10*3/uL — ABNORMAL HIGH (ref 0.1–1.0)
Monocytes Relative: 15 %
Neutro Abs: 6.4 10*3/uL (ref 1.7–7.7)
Neutrophils Relative %: 64 %
Platelets: 255 10*3/uL (ref 150–400)
RBC: 3.58 MIL/uL — ABNORMAL LOW (ref 3.87–5.11)
RDW: 15.8 % — ABNORMAL HIGH (ref 11.5–15.5)
WBC: 9.9 10*3/uL (ref 4.0–10.5)
nRBC: 0 % (ref 0.0–0.2)

## 2019-12-16 LAB — GLUCOSE, CAPILLARY
Glucose-Capillary: 158 mg/dL — ABNORMAL HIGH (ref 70–99)
Glucose-Capillary: 198 mg/dL — ABNORMAL HIGH (ref 70–99)
Glucose-Capillary: 171 mg/dL — ABNORMAL HIGH (ref 70–99)
Glucose-Capillary: 150 mg/dL — ABNORMAL HIGH (ref 70–99)

## 2019-12-16 MED ORDER — SODIUM CHLORIDE 0.9 % IV BOLUS
500.0000 mL | Freq: Once | INTRAVENOUS | Status: AC
  Administered 2019-12-16: 500 mL via INTRAVENOUS

## 2019-12-16 NOTE — Progress Notes (Signed)
PROGRESS NOTE    Patient: Molly [name]                            PCP: Ethelda Chick, MD                    DOB: 31-May-1945            DOA: 12/11/2019 GNF:[id number]             DOS: 12/16/2019, 12:33 PM   LOS: 4 days   Date of Service: The patient was seen and examined on 12/16/2019  Subjective:   The patient was seen and examined this morning, stable in no acute distress Nursing staff and patient still reporting loose watery diarrhea 6 episodes overnight. Denies any fever chills  Brief Narrative:   Molly [name]  is a 74 y.o. female with a known history of COPD, type 2 diabetes mellitus, hypertension, dyslipidemia and anxiety, who presented to the emergency room with acute onset of left lower quadrant as well as mid lower abdominal pain with associated constipation without nausea or vomiting or diarrhea.  She had no fever or chills at home.  No chest pain or dyspnea or cough or wheezing.  No dysuria, oliguria or hematuria or flank pain.  Upon presentation to the emergency room, temperature was 101.7 with a heart rate of 118 and pulse symmetry of 89% on room air 90% on 2 L of O2 by nasal cannula with a blood pressure 114/57 and respiratory rate of 19.  Labs revealed a blood glucose of 90-92 and procalcitonin of 7.51 with lactic acid 1.1 and high-sensitivity troponin I of 8.  CBC shows significant leukocytosis 23.5.  COVID-19 PCR came back negative. Abdominal pelvic CT scan showed dilated sigmoid colon and rectum with large amount of impacted stools with findings suggestive of proctocolitis.  She had also showed aortic atherosclerosis.  The patient had blood and urine cultures sent.  His stools grew enterotoxigenic E. coli.   The patient was given IV cefepime and Tylenol.  She will be admitted to a medically monitored bed for further evaluation and management   Assessment & Plan:   Principal Problem:   C. difficile colitis Active Problems:   Diabetes mellitus (HCC)   COPD  (chronic obstructive pulmonary disease) (HCC)   HTN (hypertension)   HLD (hyperlipidemia)   Sepsis (HCC)   Acute colitis  C. difficile colitis  -Still having loose watery diarrhea x6 episodes overnight, -Rectal tube---has been removed 12/14/2019 -Continue with fluid hydration... As oral intake improves, will reduce IV fluid -We will continue with IV Flagyl, p.o. vancomycin  Per infectious disease recommendation; discontinue rectal tube for proctocolitis, Discontinuing IV ceftriaxone-no treatment needed for enterotoxic E. coli-colitis   Acute proctocolitis secondary to enterotoxigenic E. Coli./History of ulcerative colitis -Last colonoscopy 2018 January also reporting proctitis, biopsy consistent with inflammatory changes with UC Complicated by chronic constipation -treated at times with enema/laxative Previous history of C. difficile colitis --?  Colonization  -ID was consulted recommended continue current treatment due to fever, meeting sepsis criteria -We will continue IV Flagyl, p.o. vancomycin -Discontinuing IV cefepime -Status post IV fluid hydration -Remained, hemodynamically stable  -Contact precautions will be followed.   Sepsis  -without severe sepsis or septic shock secondary to acute proctocolitis.   -Improved sepsis physiology -Remained hemodynamically stable --mildly hypotensive Satting 100% on room air    -Blood cultures remain negative to date -Stoole positive for Enterotoxic E.  Coli- -stool positive for C. difficile  -Improved leukocytosis -Procalcitonin spiked at 7.51 >> 2.21  -Infectious disease following appreciate any assistance    Loose/watery stool -Rectal tube discontinued, still having loose stool diarrhea x6 overnight  Type 2 diabetes mellitus. -CBG QA CHS, SSI coverage continue his basal coverage.  -We will continue Januvia. -Monitoring, stable   Dyslipidemia. -Statin therapy will be continued Remained stable  Debility/generalized  weaknesses PT/OT consult --- recommending SNF   Nutritional status:         Cultures; Blood Cultures x 2 >> NGT Stool GI panel PCR : enterotoxigenic E. coli Stool C. difficile-+ 12/13/2019  Antimicrobials: 12/11/2019 IV cefepime >> 12/14/2019 -12/12/2018 IV Flagyl >>> -12/13/2019 p.o. vancomycin    Consultants: None   ---------------------------------------------------------------------------------------------------------------------------------------------  DVT prophylaxis:  SCD/Compression stockings and Lovenox SQ Code Status:   Code Status: Full Code Family Communication: No family member present at bedside- The above findings and plan of care has been discussed with patient (and family )  in detail,  they expressed understanding and agreement of above. -Advance care planning has been discussed.   Admission status:    Status is: Inpatient  Remains inpatient appropriate because:Ongoing active pain requiring inpatient pain management, Ongoing diagnostic testing needed not appropriate for outpatient work up, Unsafe d/c plan, IV treatments appropriate due to intensity of illness or inability to take PO and Inpatient level of care appropriate due to severity of illness    Dispo: The patient is from: Home              Anticipated d/c is to: SNF in next 2-3 days (pending improved stooling, improved symptoms)              Anticipated d/c date is: In 2  days              Patient currently is not medically stable to d/c.        Procedures:   No admission procedures for hospital encounter.  Antimicrobials:  Anti-infectives (From admission, onward)   Start     Dose/Rate Route Frequency Ordered Stop   12/14/19 1200  metroNIDAZOLE (FLAGYL) IVPB 500 mg        500 mg 100 mL/hr over 60 Minutes Intravenous Every 8 hours 12/14/19 1146     12/13/19 1800  vancomycin (VANCOCIN) 50 mg/mL oral solution 125 mg        125 mg Oral Every 6 hours 12/13/19 1704     12/13/19 1400   ceFEPIme (MAXIPIME) 2 g in sodium chloride 0.9 % 100 mL IVPB  Status:  Discontinued        2 g 200 mL/hr over 30 Minutes Intravenous Every 8 hours 12/13/19 0807 12/14/19 1129   12/12/19 0800  ceFEPIme (MAXIPIME) 2 g in sodium chloride 0.9 % 100 mL IVPB  Status:  Discontinued        2 g 200 mL/hr over 30 Minutes Intravenous Every 12 hours 12/12/19 0246 12/13/19 0807   12/12/19 0400  metroNIDAZOLE (FLAGYL) IVPB 500 mg  Status:  Discontinued        500 mg 100 mL/hr over 60 Minutes Intravenous Every 8 hours 12/12/19 0246 12/14/19 1129   12/12/19 0000  ceFEPIme (MAXIPIME) 1 g in sodium chloride 0.9 % 100 mL IVPB        1 g 200 mL/hr over 30 Minutes Intravenous  Once 12/11/19 2348 12/12/19 0112       Medication:  . acidophilus  1 capsule Oral TID  . aspirin EC  81 mg Oral Daily  . atorvastatin  20 mg Oral q1800  . canagliflozin  200 mg Oral QAC breakfast  . cholecalciferol  1,000 Units Oral Daily  . clopidogrel  75 mg Oral Daily  . diclofenac sodium  2 g Topical QID  . enoxaparin (LOVENOX) injection  40 mg Subcutaneous Q24H  . insulin aspart  0-20 Units Subcutaneous TID WC  . insulin aspart  0-5 Units Subcutaneous QHS  . linagliptin  5 mg Oral Daily  . traZODone  50 mg Oral QHS  . vancomycin  125 mg Oral Q6H    acetaminophen, albuterol, ondansetron **OR** ondansetron (ZOFRAN) IV   Objective:   Vitals:   12/15/19 2058 12/16/19 0623 12/16/19 0856 12/16/19 1127  BP: (!) 124/54 121/64 135/65 99/63  Pulse: 95 90 90 91  Resp: 20 20    Temp: 98.6 F (37 C) 98.6 F (37 C)  97.7 F (36.5 C)  TempSrc: Oral Oral    SpO2: 100% 99%  100%  Weight:      Height:        Intake/Output Summary (Last 24 hours) at 12/16/2019 1233 Last data filed at 12/16/2019 0635 Gross per 24 hour  Intake 1765.26 ml  Output --  Net 1765.26 ml   Filed Weights   12/12/19 0817 12/12/19 2300  Weight: 99.8 kg 94.9 kg     Examination:       Physical Exam:   General:  Alert, oriented,  cooperative, no distress;   HEENT:  Normocephalic, PERRL, otherwise with in Normal limits   Neuro:  CNII-XII intact. , normal motor and sensation, reflexes intact   Lungs:   Clear to auscultation BL, Respirations unlabored, no wheezes / crackles  Cardio:    S1/S2, RRR, No murmure, No Rubs or Gallops   Abdomen:   Soft, non-tender, bowel sounds active all four quadrants,  no guarding or peritoneal signs, chronic discomfort abdomen  Muscular skeletal:   Generalized weaknesses Limited exam - in bed, able to move all 4 extremities, Normal strength,  2+ pulses,  symmetric, No pitting edema  Skin:  Dry, warm to touch, negative for any Rashes, No open wounds  Wounds: Please see nursing documentation    ------------------------------------------------------------------------------------------------------------------------------------------    LABs:  CBC Latest Ref Rng & Units 12/16/2019 12/15/2019 12/14/2019  WBC 4.0 - 10.5 K/uL 9.9 9.8 11.4(H)  Hemoglobin 12.0 - 15.0 g/dL 10.2(L) 10.4(L) 10.4(L)  Hematocrit 36 - 46 % 33.8(L) 33.9(L) 34.0(L)  Platelets 150 - 400 K/uL 255 254 258   CMP Latest Ref Rng & Units 12/16/2019 12/15/2019 12/14/2019  Glucose 70 - 99 mg/dL 742(V) 956(L) 875(I)  BUN 8 - 23 mg/dL 15 20 43(P)  Creatinine 0.44 - 1.00 mg/dL 2.95 1.88 4.16  Sodium 135 - 145 mmol/L 144 142 144  Potassium 3.5 - 5.1 mmol/L 3.9 3.8 4.0  Chloride 98 - 111 mmol/L 109 107 107  CO2 22 - 32 mmol/L 26 26 25   Calcium 8.9 - 10.3 mg/dL 8.1(L) 7.9(L) 8.2(L)  Total Protein 6.5 - 8.1 g/dL - - -  Total Bilirubin 0.3 - 1.2 mg/dL - - -  Alkaline Phos 38 - 126 U/L - - -  AST 15 - 41 U/L - - -  ALT 0 - 44 U/L - - -       Micro Results Recent Results (from the past 240 hour(s))  Urine culture     Status: None   Collection Time: 12/11/19 10:11 PM   Specimen: Urine, Random  Result Value  Ref Range Status   Specimen Description   Final    URINE, RANDOM Performed at [facility], [address] [place], [place] [address]    Special Requests   Final    NONE Performed at [facility], [address] [place], [place] [address]    Culture   Final    NO GROWTH Performed at [facility], [address] [place], [place] [address]    Report Status 12/13/2019 FINAL  Final  Culture, blood (routine x 2)     Status: None   Collection Time: 12/11/19 10:11 PM   Specimen: BLOOD RIGHT FOREARM  Result Value Ref Range Status   Specimen Description   Final    BLOOD RIGHT FOREARM Performed at [facility], [address] [place], [place] [address]    Special Requests   Final    BOTTLES DRAWN AEROBIC AND ANAEROBIC Blood Culture adequate volume   Culture   Final    NO GROWTH 5 DAYS Performed at [facility], [address] [place], [place] [address]    Report Status 12/16/2019 FINAL  Final  Culture, blood (routine x 2)     Status: None   Collection Time: 12/11/19 10:12 PM   Specimen: BLOOD  Result Value Ref Range Status   Specimen Description BLOOD LEFT FA  Final   Special Requests   Final    BOTTLES DRAWN AEROBIC AND ANAEROBIC Blood Culture adequate volume   Culture   Final    NO GROWTH 5 DAYS Performed at [facility], [address] [place], [place] [address]    Report Status 12/16/2019 FINAL  Final  Gastrointestinal Panel by PCR , Stool     Status: Abnormal   Collection Time: 12/11/19 10:12 PM   Specimen: Stool  Result Value Ref Range Status   Campylobacter species NOT DETECTED NOT DETECTED Final   Plesimonas shigelloides NOT DETECTED NOT DETECTED Final   Salmonella species NOT DETECTED NOT DETECTED Final   Yersinia enterocolitica NOT DETECTED NOT DETECTED Final   Vibrio species NOT DETECTED NOT DETECTED Final   Vibrio cholerae NOT DETECTED NOT DETECTED Final   Enteroaggregative E coli (EAEC) NOT DETECTED NOT DETECTED Final   Enteropathogenic E coli (EPEC) DETECTED (A) NOT DETECTED Final    Comment: RESULT CALLED TO, READ BACK BY  AND VERIFIED WITH: ashton peters @0211  on 12/12/19 skl    Enterotoxigenic E coli (ETEC) NOT DETECTED NOT DETECTED Final   Shiga like toxin producing E coli (STEC) NOT DETECTED NOT DETECTED Final   Shigella/Enteroinvasive E coli (EIEC) NOT DETECTED NOT DETECTED Final   Cryptosporidium NOT DETECTED NOT DETECTED Final   Cyclospora cayetanensis NOT DETECTED NOT DETECTED Final   Entamoeba histolytica NOT DETECTED NOT DETECTED Final   Giardia lamblia NOT DETECTED NOT DETECTED Final   Adenovirus F40/41 NOT DETECTED NOT DETECTED Final   Astrovirus NOT DETECTED NOT DETECTED Final   Norovirus GI/GII NOT DETECTED NOT DETECTED Final   Rotavirus A NOT DETECTED NOT DETECTED Final   Sapovirus (I, II, IV, and V) NOT DETECTED NOT DETECTED Final    Comment: Performed at [facility], [address] [place], [place] [address]  SARS Coronavirus 2 by RT PCR (hospital order, performed in [facility] hospital lab) Nasopharyngeal Nasopharyngeal Swab     Status: None   Collection Time: 12/11/19 11:10 PM   Specimen: Nasopharyngeal Swab  Result Value Ref Range Status   SARS Coronavirus 2 NEGATIVE NEGATIVE Final  Comment: (NOTE) SARS-CoV-2 target nucleic acids are NOT DETECTED.  The SARS-CoV-2 RNA is generally detectable in upper and lower respiratory specimens during the acute phase of infection. The lowest concentration of SARS-CoV-2 viral copies this assay can detect is 250 copies / mL. A negative result does not preclude SARS-CoV-2 infection and should not be used as the sole basis for treatment or other patient management decisions.  A negative result may occur with improper specimen collection / handling, submission of specimen other than nasopharyngeal swab, presence of viral mutation(s) within the areas targeted by this assay, and inadequate number of viral copies (<250 copies / mL). A negative result must be combined with clinical observations, patient history, and epidemiological  information.  Fact Sheet for Patients:   BoilerBrush.com.cy  Fact Sheet for Healthcare Providers: https://pope.com/  This test is not yet approved or  cleared by the [place] FDA and has been authorized for detection and/or diagnosis of SARS-CoV-2 by FDA under an Emergency Use Authorization (EUA).  This EUA will remain in effect (meaning this test can be used) for the duration of the COVID-19 declaration under Section 564(b)(1) of the Act, 21 U.S.C. section 360bbb-3(b)(1), unless the authorization is terminated or revoked sooner.  Performed at [facility], [address] [place], [place] [address]   C Difficile Quick Screen w PCR reflex     Status: Abnormal   Collection Time: 12/13/19 11:10 AM   Specimen: STOOL  Result Value Ref Range Status   C Diff antigen POSITIVE (A) NEGATIVE Final   C Diff toxin NEGATIVE NEGATIVE Final   C Diff interpretation Results are indeterminate. See PCR results.  Final    Comment: Performed at [facility], [address] [place], [place] [address]  C. Diff by PCR, Reflexed     Status: Abnormal   Collection Time: 12/13/19 11:10 AM  Result Value Ref Range Status   Toxigenic C. Difficile by PCR POSITIVE (A) NEGATIVE Final    Comment: Positive for toxigenic C. difficile with little to no toxin production. Only treat if clinical presentation suggests symptomatic illness. Performed at [facility], [address] [place], [place] [address]   CULTURE, BLOOD (ROUTINE X 2) w Reflex to ID Panel     Status: None (Preliminary result)   Collection Time: 12/14/19 11:51 AM   Specimen: BLOOD  Result Value Ref Range Status   Specimen Description BLOOD RIGHT ANTECUBITAL  Final   Special Requests   Final    BOTTLES DRAWN AEROBIC AND ANAEROBIC Blood Culture results may not be optimal due to an excessive volume of blood received in culture bottles   Culture   Final    NO GROWTH 2  DAYS Performed at [facility], [address] [place], [place] [address]    Report Status PENDING  Incomplete  CULTURE, BLOOD (ROUTINE X 2) w Reflex to ID Panel     Status: None (Preliminary result)   Collection Time: 12/14/19 11:57 AM   Specimen: BLOOD  Result Value Ref Range Status   Specimen Description BLOOD BLOOD RIGHT HAND  Final   Special Requests   Final    BOTTLES DRAWN AEROBIC AND ANAEROBIC Blood Culture adequate volume   Culture   Final    NO GROWTH 2 DAYS Performed at [facility], [address] [place], [place] [address]    Report Status PENDING  Incomplete    Radiology Reports CT ABDOMEN PELVIS W CONTRAST  Result Date: 12/11/2019 CLINICAL DATA:  Abdominal  pain fever and distension EXAM: CT ABDOMEN AND PELVIS WITH CONTRAST TECHNIQUE: Multidetector CT imaging of the abdomen and pelvis was performed using the standard protocol following bolus administration of intravenous contrast. CONTRAST:  OMNIPAQUE IOHEXOL 300 MG/ML  SOLN COMPARISON:  None. FINDINGS: Lower chest: The visualized heart size within normal limits. No pericardial fluid/thickening. No hiatal hernia. The visualized portions of the lungs are clear. Hepatobiliary: The liver is normal in density without focal abnormality.The main portal vein is patent. Layering calcified gallstones are present. Pancreas: Unremarkable. No pancreatic ductal dilatation or surrounding inflammatory changes. Spleen: Normal in size without focal abnormality. Adrenals/Urinary Tract: Both adrenal glands appear normal. Mild bilateral renal atrophy is seen. Bladder is unremarkable. Stomach/Bowel: The stomach and small bowel normal in appearance. There is a large amount of colonic stool seen throughout. Within the sigmoid rectal junction there is a large amount of stool with a rectal wall. Diffuse wall thickening is seen around the rectum with surrounding presacral fat stranding changes. There is also wall thickening seen  around the anal canal. Vascular/Lymphatic: There are no enlarged mesenteric, retroperitoneal, or pelvic lymph nodes. Scattered aortic atherosclerotic calcifications are seen without aneurysmal dilatation. Reproductive: The uterus and adnexa are unremarkable. Other: No evidence of abdominal wall mass or hernia. Musculoskeletal: No acute or significant osseous findings. IMPRESSION: 1. Dilated sigmoid colon and rectum with a large amount of impacted with findings which could be suggestive of proctocolitis. However would recommend direct visualization upon resolution of symptoms to exclude underlying mass lesion. 2.  Aortic Atherosclerosis (ICD10-I70.0). Electronically Signed   By: Jonna Clark M.D.   On: 12/11/2019 23:49   DG Chest Portable 1 View  Result Date: 12/12/2019 CLINICAL DATA:  Hypoxia EXAM: PORTABLE CHEST 1 VIEW COMPARISON:  03/08/2016 FINDINGS: Low lung volumes. Mild cardiomegaly. No consolidation or effusion. No pneumothorax. IMPRESSION: Low lung volumes with mild cardiomegaly. Electronically Signed   By: Jasmine Pang M.D.   On: 12/12/2019 00:02    SIGNED: Kendell Bane, MD, FACP, FHM. [facility],  Pager (please use [email or web address] to page/text)  If 7PM-7AM, please contact night-coverage [email or web address] 12/16/2019, 12:33 PM

## 2019-12-17 LAB — BASIC METABOLIC PANEL
Anion gap: 13 (ref 5–15)
BUN: 14 mg/dL (ref 8–23)
CO2: 22 mmol/L (ref 22–32)
Calcium: 7.9 mg/dL — ABNORMAL LOW (ref 8.9–10.3)
Chloride: 108 mmol/L (ref 98–111)
Creatinine, Ser: 0.76 mg/dL (ref 0.44–1.00)
GFR calc Af Amer: >60 mL/min (ref >60)
GFR calc non Af Amer: >60 mL/min (ref >60)
Glucose, Bld: 136 mg/dL — ABNORMAL HIGH (ref 70–99)
Potassium: 3.9 mmol/L (ref 3.5–5.1)
Sodium: 143 mmol/L (ref 135–145)

## 2019-12-17 LAB — CBC WITH DIFFERENTIAL/PLATELET
Abs Immature Granulocytes: 0.56 10*3/uL — ABNORMAL HIGH (ref 0.00–0.07)
Basophils Absolute: 0 10*3/uL (ref 0.0–0.1)
Basophils Relative: 0 %
Eosinophils Absolute: 0.1 10*3/uL (ref 0.0–0.5)
Eosinophils Relative: 1 %
HCT: 34 % — ABNORMAL LOW (ref 36.0–46.0)
Hemoglobin: 10.6 g/dL — ABNORMAL LOW (ref 12.0–15.0)
Immature Granulocytes: 5 %
Lymphocytes Relative: 9 %
Lymphs Abs: 1.1 10*3/uL (ref 0.7–4.0)
MCH: 28.6 pg (ref 26.0–34.0)
MCHC: 31.2 g/dL (ref 30.0–36.0)
MCV: 91.9 fL (ref 80.0–100.0)
Monocytes Absolute: 1.7 10*3/uL — ABNORMAL HIGH (ref 0.1–1.0)
Monocytes Relative: 14 %
Neutro Abs: 8.8 10*3/uL — ABNORMAL HIGH (ref 1.7–7.7)
Neutrophils Relative %: 71 %
Platelets: 252 10*3/uL (ref 150–400)
RBC: 3.7 MIL/uL — ABNORMAL LOW (ref 3.87–5.11)
RDW: 15.9 % — ABNORMAL HIGH (ref 11.5–15.5)
Smear Review: NORMAL
WBC: 12.2 10*3/uL — ABNORMAL HIGH (ref 4.0–10.5)
nRBC: 0.2 % (ref 0.0–0.2)

## 2019-12-17 LAB — GLUCOSE, CAPILLARY
Glucose-Capillary: 123 mg/dL — ABNORMAL HIGH (ref 70–99)
Glucose-Capillary: 166 mg/dL — ABNORMAL HIGH (ref 70–99)
Glucose-Capillary: 165 mg/dL — ABNORMAL HIGH (ref 70–99)

## 2019-12-17 MED ORDER — RISAQUAD PO CAPS
2.0000 | ORAL_CAPSULE | Freq: Three times a day (TID) | ORAL | Status: DC
  Administered 2019-12-17 – 2019-12-18 (×3): 2 via ORAL
  Filled 2019-12-17 (×3): qty 2

## 2019-12-17 NOTE — Progress Notes (Signed)
Patient's BP soft, and she is tachycardic. Received orders for 500 ml bolus. BP improved.

## 2019-12-17 NOTE — Progress Notes (Signed)
PROGRESS NOTE    Patient: Molly [name]                            PCP: Ethelda Chick, MD                    DOB: 31-May-1945            DOA: 12/11/2019 [id number]:[id number]             DOS: 12/17/2019, 11:17 AM   LOS: 5 days   Date of Service: The patient was seen and examined on 12/17/2019  Subjective:   The patient was seen and examined this morning, stable --blood pressure remained soft Patient received 500 mL bolus fluid last night  Still having 9 bouts of watery stool.  Tolerating p.o... No complaints this morning  Brief Narrative:   Molly [name]  is a 74 y.o. female with a known history of COPD, type 2 diabetes mellitus, hypertension, dyslipidemia and anxiety, who presented to the emergency room with acute onset of left lower quadrant as well as mid lower abdominal pain with associated constipation without nausea or vomiting or diarrhea.  She had no fever or chills at home.  No chest pain or dyspnea or cough or wheezing.  No dysuria, oliguria or hematuria or flank pain.  Upon presentation to the emergency room, temperature was 101.7 with a heart rate of 118 and pulse symmetry of 89% on room air 90% on 2 L of O2 by nasal cannula with a blood pressure 114/57 and respiratory rate of 19.  Labs revealed a blood glucose of 90-92 and procalcitonin of 7.51 with lactic acid 1.1 and high-sensitivity troponin I of 8.  CBC shows significant leukocytosis 23.5.  COVID-19 PCR came back negative. Abdominal pelvic CT scan showed dilated sigmoid colon and rectum with large amount of impacted stools with findings suggestive of proctocolitis.  She had also showed aortic atherosclerosis.  The patient had blood and urine cultures sent.  His stools grew enterotoxigenic E. coli.   The patient was given IV cefepime and Tylenol.  She will be admitted to a medically monitored bed for further evaluation and management   Assessment & Plan:   Principal Problem:   C. difficile colitis Active Problems:    Diabetes mellitus (HCC)   COPD (chronic obstructive pulmonary disease) (HCC)   HTN (hypertension)   HLD (hyperlipidemia)   Sepsis (HCC)   Acute colitis  C. difficile colitis  -Still having 9 bouts of loose stool--- Mildly hypotensive overnight, 500 bolus normal saline was given overnight  -Rectal tube---has been removed 12/14/2019 -Continue with fluid hydration... As oral intake improves, will reduce IV fluid -We will continue with IV Flagyl, p.o. vancomycin  Per infectious disease recommendation; discontinue rectal tube for proctocolitis, Discontinuing IV ceftriaxone-no treatment needed for enterotoxic E. coli-colitis   Acute proctocolitis secondary to enterotoxigenic E. Coli./History of ulcerative colitis Stable now -Last colonoscopy 2018 January also reporting proctitis, biopsy consistent with inflammatory changes with UC Complicated by chronic constipation -treated at times with enema/laxative Previous history of C. difficile colitis --?  Colonization  -ID was consulted recommended continue current treatment due to fever, meeting sepsis criteria -We will continue IV Flagyl, p.o. vancomycin -Discontinuing IV cefepime -Status post IV fluid hydration   -Contact precautions will be followed.   Sepsis  -Sepsis physiology resolved -without severe sepsis or septic shock secondary to acute proctocolitis.   -Remained hemodynamically stable --mildly hypotensive Satting 100%  on room air    -Blood cultures remain negative to date -Stoole positive for Enterotoxic E. Coli- -stool positive for C. difficile  -Improved leukocytosis -Procalcitonin spiked at 7.51 >> 2.21  -Infectious disease following appreciate any assistance    Loose/watery stool -Rectal tube discontinued,  Still having loose stool, x9 stool overnight  Type 2 diabetes mellitus. -CBG QA CHS, SSI coverage continue his basal coverage.  -We will continue Januvia. -Stable monitoring    Dyslipidemia. -Statin therapy will be continued Stable  Debility/generalized weaknesses PT/OT consult --- recommending SNF   Nutritional status:         Cultures; Blood Cultures x 2 >> NGT Stool GI panel PCR : enterotoxigenic E. coli Stool C. difficile-+ 12/13/2019  Antimicrobials: 12/11/2019 IV cefepime >> 12/14/2019 -12/12/2018 IV Flagyl >>> -12/13/2019 p.o. vancomycin    Consultants: None   ---------------------------------------------------------------------------------------------------------------------------------------------  DVT prophylaxis:  SCD/Compression stockings and Lovenox SQ Code Status:   Code Status: Full Code Family Communication: No family member present at bedside- The above findings and plan of care has been discussed with patient (and family )  in detail,  they expressed understanding and agreement of above. -Advance care planning has been discussed.   Admission status:    Status is: Inpatient  Remains inpatient appropriate because:Ongoing active pain requiring inpatient pain management, Ongoing diagnostic testing needed not appropriate for outpatient work up, Unsafe d/c plan, IV treatments appropriate due to intensity of illness or inability to take PO and Inpatient level of care appropriate due to severity of illness    Dispo: The patient is from: Home              Anticipated d/c is to: Pending SNF - SNF in next 2-3 days (pending improved stooling, improved symptoms)              Anticipated d/c date is: In 2  days              Patient currently is not medically stable to d/c.        Procedures:   No admission procedures for hospital encounter.  Antimicrobials:  Anti-infectives (From admission, onward)   Start     Dose/Rate Route Frequency Ordered Stop   12/14/19 1200  metroNIDAZOLE (FLAGYL) IVPB 500 mg        500 mg 100 mL/hr over 60 Minutes Intravenous Every 8 hours 12/14/19 1146     12/13/19 1800  vancomycin (VANCOCIN) 50  mg/mL oral solution 125 mg        125 mg Oral Every 6 hours 12/13/19 1704     12/13/19 1400  ceFEPIme (MAXIPIME) 2 g in sodium chloride 0.9 % 100 mL IVPB  Status:  Discontinued        2 g 200 mL/hr over 30 Minutes Intravenous Every 8 hours 12/13/19 0807 12/14/19 1129   12/12/19 0800  ceFEPIme (MAXIPIME) 2 g in sodium chloride 0.9 % 100 mL IVPB  Status:  Discontinued        2 g 200 mL/hr over 30 Minutes Intravenous Every 12 hours 12/12/19 0246 12/13/19 0807   12/12/19 0400  metroNIDAZOLE (FLAGYL) IVPB 500 mg  Status:  Discontinued        500 mg 100 mL/hr over 60 Minutes Intravenous Every 8 hours 12/12/19 0246 12/14/19 1129   12/12/19 0000  ceFEPIme (MAXIPIME) 1 g in sodium chloride 0.9 % 100 mL IVPB        1 g 200 mL/hr over 30 Minutes Intravenous  Once 12/11/19 2348 12/12/19  0112       Medication:  . acidophilus  2 capsule Oral TID  . aspirin EC  81 mg Oral Daily  . atorvastatin  20 mg Oral q1800  . canagliflozin  200 mg Oral QAC breakfast  . cholecalciferol  1,000 Units Oral Daily  . clopidogrel  75 mg Oral Daily  . diclofenac sodium  2 g Topical QID  . enoxaparin (LOVENOX) injection  40 mg Subcutaneous Q24H  . insulin aspart  0-20 Units Subcutaneous TID WC  . insulin aspart  0-5 Units Subcutaneous QHS  . linagliptin  5 mg Oral Daily  . traZODone  50 mg Oral QHS  . vancomycin  125 mg Oral Q6H    acetaminophen, albuterol, ondansetron **OR** ondansetron (ZOFRAN) IV   Objective:   Vitals:   12/16/19 1127 12/16/19 2019 12/16/19 2341 12/17/19 0516  BP: 99/63 (!) 89/53 108/63 125/74  Pulse: 91 (!) 101 90 (!) 102  Resp:  20 18 20   Temp: 97.7 F (36.5 C) 98.4 F (36.9 C)  99.4 F (37.4 C)  TempSrc:  Oral  Oral  SpO2: 100% 97%  95%  Weight:      Height:        Intake/Output Summary (Last 24 hours) at 12/17/2019 1117 Last data filed at 12/17/2019 [id number] Gross per 24 hour  Intake 1699.48 ml  Output --  Net 1699.48 ml   Filed Weights   12/12/19 0817 12/12/19 2300   Weight: 99.8 kg 94.9 kg     Examination:         Physical Exam:   General:  Alert, oriented, cooperative, no distress;   HEENT:  Normocephalic, PERRL, otherwise with in Normal limits   Neuro:  CNII-XII intact. , normal motor and sensation, reflexes intact   Lungs:   Clear to auscultation BL, Respirations unlabored, no wheezes / crackles  Cardio:    S1/S2, RRR, No murmure, No Rubs or Gallops   Abdomen:   Soft, chronic distended abdomen, non-tender, bowel sounds active all four quadrants,  no guarding or peritoneal signs.  Muscular skeletal:  Limited exam - in bed, able to move all 4 extremities, Normal strength,  2+ pulses,  symmetric, No pitting edema  Skin:  Dry, warm to touch, negative for any Rashes, No open wounds  Wounds: Please see nursing documentation        ------------------------------------------------------------------------------------------------------------------------------------------    LABs:  CBC Latest Ref Rng & Units 12/17/2019 12/16/2019 12/15/2019  WBC 4.0 - 10.5 K/uL 12.2(H) 9.9 9.8  Hemoglobin 12.0 - 15.0 g/dL 10.6(L) 10.2(L) 10.4(L)  Hematocrit 36 - 46 % 34.0(L) 33.8(L) 33.9(L)  Platelets 150 - 400 K/uL 252 255 254   CMP Latest Ref Rng & Units 12/17/2019 12/16/2019 12/15/2019  Glucose 70 - 99 mg/dL 960(A136(H) 540(J164(H) 811(B180(H)  BUN 8 - 23 mg/dL 14 15 20   Creatinine 0.44 - 1.00 mg/dL 1.470.76 8.290.71 5.620.65  Sodium 135 - 145 mmol/L 143 144 142  Potassium 3.5 - 5.1 mmol/L 3.9 3.9 3.8  Chloride 98 - 111 mmol/L 108 109 107  CO2 22 - 32 mmol/L 22 26 26   Calcium 8.9 - 10.3 mg/dL 7.9(L) 8.1(L) 7.9(L)  Total Protein 6.5 - 8.1 g/dL - - -  Total Bilirubin 0.3 - 1.2 mg/dL - - -  Alkaline Phos 38 - 126 U/L - - -  AST 15 - 41 U/L - - -  ALT 0 - 44 U/L - - -       Micro Results Recent Results (from the past  240 hour(s))  Urine culture     Status: None   Collection Time: 12/11/19 10:11 PM   Specimen: Urine, Random  Result Value Ref Range Status   Specimen  Description   Final    URINE, RANDOM Performed at [facility], [address], [place], [place] [address]    Special Requests   Final    NONE Performed at [facility], [address] [place], [place] [address]    Culture   Final    NO GROWTH Performed at [facility], [address], [place], [place] [address]    Report Status 12/13/2019 FINAL  Final  Culture, blood (routine x 2)     Status: None   Collection Time: 12/11/19 10:11 PM   Specimen: BLOOD RIGHT FOREARM  Result Value Ref Range Status   Specimen Description   Final    BLOOD RIGHT FOREARM Performed at [facility], [address], [place], [place] [address]    Special Requests   Final    BOTTLES DRAWN AEROBIC AND ANAEROBIC Blood Culture adequate volume   Culture   Final    NO GROWTH 5 DAYS Performed at [facility], [address] [place], [place] [address]    Report Status 12/16/2019 FINAL  Final  Culture, blood (routine x 2)     Status: None   Collection Time: 12/11/19 10:12 PM   Specimen: BLOOD  Result Value Ref Range Status   Specimen Description BLOOD LEFT FA  Final   Special Requests   Final    BOTTLES DRAWN AEROBIC AND ANAEROBIC Blood Culture adequate volume   Culture   Final    NO GROWTH 5 DAYS Performed at [facility], [address] [place], [place] [address]    Report Status 12/16/2019 FINAL  Final  Gastrointestinal Panel by PCR , Stool     Status: Abnormal   Collection Time: 12/11/19 10:12 PM   Specimen: Stool  Result Value Ref Range Status   Campylobacter species NOT DETECTED NOT DETECTED Final   Plesimonas shigelloides NOT DETECTED NOT DETECTED Final   Salmonella species NOT DETECTED NOT DETECTED Final   Yersinia enterocolitica NOT DETECTED NOT DETECTED Final   Vibrio species NOT DETECTED NOT DETECTED Final   Vibrio cholerae NOT DETECTED NOT DETECTED Final   Enteroaggregative E coli (EAEC) NOT DETECTED NOT DETECTED Final    Enteropathogenic E coli (EPEC) DETECTED (A) NOT DETECTED Final    Comment: RESULT CALLED TO, READ BACK BY AND VERIFIED WITH: ashton peters @0211  on 12/12/19 skl    Enterotoxigenic E coli (ETEC) NOT DETECTED NOT DETECTED Final   Shiga like toxin producing E coli (STEC) NOT DETECTED NOT DETECTED Final   Shigella/Enteroinvasive E coli (EIEC) NOT DETECTED NOT DETECTED Final   Cryptosporidium NOT DETECTED NOT DETECTED Final   Cyclospora cayetanensis NOT DETECTED NOT DETECTED Final   Entamoeba histolytica NOT DETECTED NOT DETECTED Final   Giardia lamblia NOT DETECTED NOT DETECTED Final   Adenovirus F40/41 NOT DETECTED NOT DETECTED Final   Astrovirus NOT DETECTED NOT DETECTED Final   Norovirus GI/GII NOT DETECTED NOT DETECTED Final   Rotavirus A NOT DETECTED NOT DETECTED Final   Sapovirus (I, II, IV, and V) NOT DETECTED NOT DETECTED Final    Comment: Performed at [facility], [address] [place], [place]  SARS Coronavirus 2 by RT PCR (hospital order, performed in [facility] hospital lab) Nasopharyngeal Nasopharyngeal Swab     Status: None  Collection Time: 12/11/19 11:10 PM   Specimen: Nasopharyngeal Swab  Result Value Ref Range Status   SARS Coronavirus 2 NEGATIVE NEGATIVE Final    Comment: (NOTE) SARS-CoV-2 target nucleic acids are NOT DETECTED.  The SARS-CoV-2 RNA is generally detectable in upper and lower respiratory specimens during the acute phase of infection. The lowest concentration of SARS-CoV-2 viral copies this assay can detect is 250 copies / mL. A negative result does not preclude SARS-CoV-2 infection and should not be used as the sole basis for treatment or other patient management decisions.  A negative result may occur with improper specimen collection / handling, submission of specimen other than nasopharyngeal swab, presence of viral mutation(s) within the areas targeted by this assay, and inadequate number of viral copies (<250 copies / mL). A  negative result must be combined with clinical observations, patient history, and epidemiological information.  Fact Sheet for Patients:   BoilerBrush.com.cy  Fact Sheet for Healthcare Providers: https://pope.com/  This test is not yet approved or  cleared by the [place] FDA and has been authorized for detection and/or diagnosis of SARS-CoV-2 by FDA under an Emergency Use Authorization (EUA).  This EUA will remain in effect (meaning this test can be used) for the duration of the COVID-19 declaration under Section 564(b)(1) of the Act, 21 U.S.C. section 360bbb-3(b)(1), unless the authorization is terminated or revoked sooner.  Performed at [facility], [address] [place], [place] [address]   C Difficile Quick Screen w PCR reflex     Status: Abnormal   Collection Time: 12/13/19 11:10 AM   Specimen: STOOL  Result Value Ref Range Status   C Diff antigen POSITIVE (A) NEGATIVE Final   C Diff toxin NEGATIVE NEGATIVE Final   C Diff interpretation Results are indeterminate. See PCR results.  Final    Comment: Performed at [facility], [address] [place], [place] [address]  C. Diff by PCR, Reflexed     Status: Abnormal   Collection Time: 12/13/19 11:10 AM  Result Value Ref Range Status   Toxigenic C. Difficile by PCR POSITIVE (A) NEGATIVE Final    Comment: Positive for toxigenic C. difficile with little to no toxin production. Only treat if clinical presentation suggests symptomatic illness. Performed at [facility], [address] [place], [place] [address]   CULTURE, BLOOD (ROUTINE X 2) w Reflex to ID Panel     Status: None (Preliminary result)   Collection Time: 12/14/19 11:51 AM   Specimen: BLOOD  Result Value Ref Range Status   Specimen Description BLOOD RIGHT ANTECUBITAL  Final   Special Requests   Final    BOTTLES DRAWN AEROBIC AND ANAEROBIC Blood Culture results may not be optimal  due to an excessive volume of blood received in culture bottles   Culture   Final    NO GROWTH 3 DAYS Performed at [facility], [address], [place], [place] [address]    Report Status PENDING  Incomplete  CULTURE, BLOOD (ROUTINE X 2) w Reflex to ID Panel     Status: None (Preliminary result)   Collection Time: 12/14/19 11:57 AM   Specimen: BLOOD  Result Value Ref Range Status   Specimen Description BLOOD BLOOD RIGHT HAND  Final   Special Requests   Final    BOTTLES DRAWN AEROBIC AND ANAEROBIC Blood Culture adequate volume   Culture   Final    NO GROWTH 3 DAYS Performed at [facility], [address], [place], [place]  [address]    Report Status PENDING  Incomplete    Radiology Reports CT ABDOMEN PELVIS W CONTRAST  Result Date: 12/11/2019 CLINICAL DATA:  Abdominal pain fever and distension EXAM: CT ABDOMEN AND PELVIS WITH CONTRAST TECHNIQUE: Multidetector CT imaging of the abdomen and pelvis was performed using the standard protocol following bolus administration of intravenous contrast. CONTRAST:  OMNIPAQUE IOHEXOL 300 MG/ML  SOLN COMPARISON:  None. FINDINGS: Lower chest: The visualized heart size within normal limits. No pericardial fluid/thickening. No hiatal hernia. The visualized portions of the lungs are clear. Hepatobiliary: The liver is normal in density without focal abnormality.The main portal vein is patent. Layering calcified gallstones are present. Pancreas: Unremarkable. No pancreatic ductal dilatation or surrounding inflammatory changes. Spleen: Normal in size without focal abnormality. Adrenals/Urinary Tract: Both adrenal glands appear normal. Mild bilateral renal atrophy is seen. Bladder is unremarkable. Stomach/Bowel: The stomach and small bowel normal in appearance. There is a large amount of colonic stool seen throughout. Within the sigmoid rectal junction there is a large amount of stool with a rectal wall. Diffuse wall thickening is seen  around the rectum with surrounding presacral fat stranding changes. There is also wall thickening seen around the anal canal. Vascular/Lymphatic: There are no enlarged mesenteric, retroperitoneal, or pelvic lymph nodes. Scattered aortic atherosclerotic calcifications are seen without aneurysmal dilatation. Reproductive: The uterus and adnexa are unremarkable. Other: No evidence of abdominal wall mass or hernia. Musculoskeletal: No acute or significant osseous findings. IMPRESSION: 1. Dilated sigmoid colon and rectum with a large amount of impacted with findings which could be suggestive of proctocolitis. However would recommend direct visualization upon resolution of symptoms to exclude underlying mass lesion. 2.  Aortic Atherosclerosis (ICD10-I70.0). Electronically Signed   By: Jonna Clark M.D.   On: 12/11/2019 23:49   DG Chest Portable 1 View  Result Date: 12/12/2019 CLINICAL DATA:  Hypoxia EXAM: PORTABLE CHEST 1 VIEW COMPARISON:  03/08/2016 FINDINGS: Low lung volumes. Mild cardiomegaly. No consolidation or effusion. No pneumothorax. IMPRESSION: Low lung volumes with mild cardiomegaly. Electronically Signed   By: Jasmine Pang M.D.   On: 12/12/2019 00:02    SIGNED: Kendell Bane, MD, FACP, FHM. [facility],  Pager (please use [email or web address] to page/text)  If 7PM-7AM, please contact night-coverage [email or web address] 12/17/2019, 11:17 AM

## 2019-12-18 LAB — CBC WITH DIFFERENTIAL/PLATELET
Abs Immature Granulocytes: 0.52 10*3/uL — ABNORMAL HIGH (ref 0.00–0.07)
Basophils Absolute: 0.1 10*3/uL (ref 0.0–0.1)
Basophils Relative: 1 %
Eosinophils Absolute: 0.1 10*3/uL (ref 0.0–0.5)
Eosinophils Relative: 1 %
HCT: 33.7 % — ABNORMAL LOW (ref 36.0–46.0)
Hemoglobin: 10.8 g/dL — ABNORMAL LOW (ref 12.0–15.0)
Immature Granulocytes: 4 %
Lymphocytes Relative: 11 %
Lymphs Abs: 1.4 10*3/uL (ref 0.7–4.0)
MCH: 29 pg (ref 26.0–34.0)
MCHC: 32 g/dL (ref 30.0–36.0)
MCV: 90.3 fL (ref 80.0–100.0)
Monocytes Absolute: 1.9 10*3/uL — ABNORMAL HIGH (ref 0.1–1.0)
Monocytes Relative: 15 %
Neutro Abs: 8.7 10*3/uL — ABNORMAL HIGH (ref 1.7–7.7)
Neutrophils Relative %: 68 %
Platelets: 271 10*3/uL (ref 150–400)
RBC: 3.73 MIL/uL — ABNORMAL LOW (ref 3.87–5.11)
RDW: 15.6 % — ABNORMAL HIGH (ref 11.5–15.5)
Smear Review: NORMAL
WBC Morphology: INCREASED
WBC: 12.8 10*3/uL — ABNORMAL HIGH (ref 4.0–10.5)
nRBC: 0.2 % (ref 0.0–0.2)

## 2019-12-18 LAB — BASIC METABOLIC PANEL
Anion gap: 13 (ref 5–15)
BUN: 13 mg/dL (ref 8–23)
CO2: 23 mmol/L (ref 22–32)
Calcium: 7.9 mg/dL — ABNORMAL LOW (ref 8.9–10.3)
Chloride: 104 mmol/L (ref 98–111)
Creatinine, Ser: 0.74 mg/dL (ref 0.44–1.00)
GFR calc Af Amer: >60 mL/min (ref >60)
GFR calc non Af Amer: >60 mL/min (ref >60)
Glucose, Bld: 159 mg/dL — ABNORMAL HIGH (ref 70–99)
Potassium: 3.7 mmol/L (ref 3.5–5.1)
Sodium: 140 mmol/L (ref 135–145)

## 2019-12-18 LAB — GLUCOSE, CAPILLARY
Glucose-Capillary: 136 mg/dL — ABNORMAL HIGH (ref 70–99)
Glucose-Capillary: 227 mg/dL — ABNORMAL HIGH (ref 70–99)

## 2019-12-18 LAB — SARS CORONAVIRUS 2 BY RT PCR (HOSPITAL ORDER, PERFORMED IN ~~LOC~~ HOSPITAL LAB): SARS Coronavirus 2: NEGATIVE

## 2019-12-18 MED ORDER — SENNOSIDES-DOCUSATE SODIUM 8.6-50 MG PO TABS: 1.0000 | ORAL_TABLET | Freq: Every evening | ORAL | 0 refills | Status: DC | PRN

## 2019-12-18 MED ORDER — VANCOMYCIN 50 MG/ML ORAL SOLUTION: 125.0000 mg | Freq: Four times a day (QID) | ORAL | 0 refills | Status: DC

## 2019-12-18 MED ORDER — VANCOMYCIN HCL 125 MG PO CAPS: 125.0000 mg | ORAL_CAPSULE | Freq: Four times a day (QID) | ORAL | 0 refills | Status: DC

## 2019-12-18 MED ORDER — INSULIN ASPART 100 UNIT/ML ~~LOC~~ SOLN: 0.0000 [IU] | Freq: Three times a day (TID) | SUBCUTANEOUS | 11 refills | Status: DC

## 2019-12-18 MED ORDER — RISAQUAD PO CAPS: 2.0000 | ORAL_CAPSULE | Freq: Three times a day (TID) | ORAL | 0 refills | Status: AC

## 2019-12-18 MED ORDER — POLYETHYLENE GLYCOL 3350 17 G PO PACK: 17.0000 g | PACK | Freq: Every day | ORAL | 0 refills | Status: DC | PRN

## 2019-12-18 NOTE — TOC Transition Note (Signed)
Transition of Care [facility]) - CM/SW Discharge Note   Patient Details  Name: Molly [name] MRN: [id number] Date of Birth: 09-29-1945  Transition of Care [facility]) CM/SW Contact:  Chapman Fitch, RN Phone Number: 12/18/2019, 2:13 PM   Clinical Narrative:    Patient to return to [facility] today Bedside RN has notified patient's spouse  DC information sent to facility in HUB  Bedside RN to call report EMS packet on Chart EMS transportation arranged.    Final next level of care: Skilled Nursing Facility Barriers to Discharge: No Barriers Identified   Patient Goals and CMS Choice        Discharge Placement              Patient chooses bed at: [facility] Patient to be transferred to facility by: EMS      Discharge Plan and Services                                     Social Determinants of Health (SDOH) Interventions     Readmission Risk Interventions Readmission Risk Prevention Plan 12/13/2019  Transportation Screening Complete  Palliative Care Screening Not Applicable  Medication Review (RN Care Manager) Complete  Some recent data might be hidden

## 2019-12-18 NOTE — Discharge Summary (Signed)
Physician Discharge Summary [facility]    Patient: IMO CUMBIE                   Admit date: 12/11/2019   DOB: 01/31/1946             Discharge date:12/18/2019/7:56 AM MVE:[id number]                          PCP: Ethelda Chick, MD  Disposition: SNF  Recommendations for Outpatient Follow-up:   . Follow up: in 1 week  Discharge Condition: Stable   Code Status:   Code Status: Full Code  Diet recommendation: Regular healthy diet   Discharge Diagnoses:    Principal Problem:   C. difficile colitis Active Problems:   Diabetes mellitus (HCC)   COPD (chronic obstructive pulmonary disease) (HCC)   HTN (hypertension)   HLD (hyperlipidemia)   Sepsis (HCC)   Acute colitis   History of Present Illness/ Hospital Course Charline Bills Summary:   BrendaGarneris a73 y.o.femalewith a known history ofCOPD, type 2diabetes mellitus, hypertension, dyslipidemia and anxiety,who presented to the emergency room with acute onset of left lower quadrant as well as mid lower abdominal pain with associated constipation without nausea or vomiting or diarrhea.Shehad no fever or chills at home. No chest pain or dyspnea or cough or wheezing. No dysuria, oliguria or hematuria or flank pain.  Upon presentation to the emergency room, temperature was 101.7 with a heart rate of 118 and pulse symmetry of 89% on room air 90% on 2 L of O2 by nasal cannula with a blood pressure 114/57 and respiratory rate of 19. Labs revealed a bloodglucose of 90-92 and procalcitonin of 7.51 with lactic acid 1.1 and high-sensitivity troponin I of 8. CBC shows significant leukocytosis 23.5. COVID-19 PCR came back negative. Abdominal pelvic CT scan showed dilated sigmoid colon and rectum with large amount of impacted stools with findings suggestive of proctocolitis.She had also showed aortic atherosclerosis.The patient had blood and urine cultures sent. His stools grew enterotoxigenic E. coli.   The  patient was given IV cefepime and Tylenol. She will be admitted to a medically monitored bed for further evaluation and management   D/C summery:   C. difficile colitis  -Still having loose stool, but improving-- -status post fluid hydration  -Rectal tube---has been removed 12/14/2019 -Continue with fluid hydration... As oral intake improves, will reduce IV fluid -Has been on IV Flagyl till 12/18/2019,  and p.o. vancomycin which will be continued for 10 more days  Per infectious disease recommendation; discontinue rectal tube for proctocolitis, Discontinuing IV ceftriaxone-no treatment needed for enterotoxic E. coli-colitis   Acute proctocolitis secondary to enterotoxigenic E. Coli./History of ulcerative colitis Stable now -Last colonoscopy 2018 January also reporting proctitis, biopsy consistent with inflammatory changes with UC Complicated by chronic constipation -treated at times with enema/laxative Previous history of C. difficile colitis --?  Colonization  -ID was consulted recommended continue current treatment due to fever, meeting sepsis criteria -We will continue IV Flagyl, p.o. vancomycin -Discontinuing IV cefepime -Status post IV fluid hydration   -Contact precautions will be followed.  Sepsis  -Sepsis physiology resolved -without severe sepsis or septic shocksecondary to acute proctocolitis.  -Remained hemodynamically stable --mildly hypotensive Satting 100% on room air   -Blood cultures remain negative to date -Stoole positive for Enterotoxic E. Coli- -stool positive for C. difficile  -Improved leukocytosis -Procalcitonin spiked at 7.51 >> 2.21  -Infectious disease following appreciate any assistance  Loose/watery stool -Rectal tube discontinued,  Still having loose stool, x9 stool overnight  Type 2 diabetes mellitus. -CBG QA CHS, SSI coverage continue his basal coverage. -We will continue Januvia. -Stable  monitoring  Dyslipidemia. -Statin therapy will be continued Stable  Debility/generalized weaknesses PT/OT consult --- recommending SNF   Nutritional status:   Cultures; Blood Cultures x 2 >> NGT Stool GI panel PCR : enterotoxigenic E. coli Stool C. difficile-+ 12/13/2019  Antimicrobials: 12/11/2019 IV cefepime >> 12/14/2019 -12/12/2018 IV Flagyl >>>12/18/19 -12/13/2019 p.o. vancomycin  For 10 more days     Consultants:  ID    Discharge Instructions:   Discharge Instructions    Activity as tolerated - No restrictions   Complete by: As directed    Call MD for:  difficulty breathing, headache or visual disturbances   Complete by: As directed    Call MD for:  persistant nausea and vomiting   Complete by: As directed    Call MD for:  temperature >100.4   Complete by: As directed    Diet - low sodium heart healthy   Complete by: As directed    Discharge instructions   Complete by: As directed    F/u with PCP, infectious disease   Increase activity slowly   Complete by: As directed        Medication List    STOP taking these medications   insulin aspart protamine - aspart (70-30) 100 UNIT/ML FlexPen Commonly known as: NOVOLOG 70/30 MIX   Invokana 100 MG Tabs tablet Generic drug: canagliflozin   metFORMIN 500 MG 24 hr tablet Commonly known as: GLUCOPHAGE-XR     TAKE these medications   acetaminophen 325 MG tablet Commonly known as: TYLENOL Take 650 mg by mouth every 6 (six) hours as needed.   acidophilus Caps capsule Take 2 capsules by mouth 3 (three) times daily for 15 days.   albuterol 108 (90 Base) MCG/ACT inhaler Commonly known as: VENTOLIN HFA ALBUTEROL 90 MCG/ACT AERS   amitriptyline 75 MG tablet Commonly known as: ELAVIL Take 75 mg by mouth at bedtime.   aspirin EC 81 MG tablet Take 81 mg by mouth daily.   atorvastatin 20 MG tablet Commonly known as: LIPITOR Take 20 mg by mouth daily at 6 PM.   clopidogrel 75 MG tablet Commonly  known as: PLAVIX Take 75 mg by mouth.   diclofenac sodium 1 % Gel Commonly known as: VOLTAREN Apply 2 g topically 4 (four) times daily. To each shoulders   insulin aspart 100 UNIT/ML injection Commonly known as: novoLOG Inject 0-20 Units into the skin 3 (three) times daily with meals.   magnesium oxide 400 MG tablet Commonly known as: MAG-OX Take 400 mg by mouth 2 (two) times daily.   polyethylene glycol 17 g packet Commonly known as: MIRALAX / GLYCOLAX Take 17 g by mouth daily as needed. What changed:   when to take this  reasons to take this   senna-docusate 8.6-50 MG tablet Commonly known as: Senokot-S Take 1 tablet by mouth at bedtime as needed for mild constipation. What changed:   when to take this  reasons to take this   sitaGLIPtin 100 MG tablet Commonly known as: JANUVIA Take 100 mg by mouth daily.   SUPER B COMPLEX PO Take 1 tablet by mouth daily.   traZODone 50 MG tablet Commonly known as: DESYREL Take 50 mg by mouth at bedtime.   vancomycin 50 mg/mL  oral solution Commonly known as: VANCOCIN Take 2.5 mLs (125 mg total)  by mouth every 6 (six) hours for 10 days.   Vitamin D3 25 MCG (1000 UT) Caps Take 1,000 Units by mouth daily.       No Known Allergies   Procedures /Studies:   CT ABDOMEN PELVIS W CONTRAST  Result Date: 12/11/2019 CLINICAL DATA:  Abdominal pain fever and distension EXAM: CT ABDOMEN AND PELVIS WITH CONTRAST TECHNIQUE: Multidetector CT imaging of the abdomen and pelvis was performed using the standard protocol following bolus administration of intravenous contrast. CONTRAST:  OMNIPAQUE IOHEXOL 300 MG/ML  SOLN COMPARISON:  None. FINDINGS: Lower chest: The visualized heart size within normal limits. No pericardial fluid/thickening. No hiatal hernia. The visualized portions of the lungs are clear. Hepatobiliary: The liver is normal in density without focal abnormality.The main portal vein is patent. Layering calcified gallstones  are present. Pancreas: Unremarkable. No pancreatic ductal dilatation or surrounding inflammatory changes. Spleen: Normal in size without focal abnormality. Adrenals/Urinary Tract: Both adrenal glands appear normal. Mild bilateral renal atrophy is seen. Bladder is unremarkable. Stomach/Bowel: The stomach and small bowel normal in appearance. There is a large amount of colonic stool seen throughout. Within the sigmoid rectal junction there is a large amount of stool with a rectal wall. Diffuse wall thickening is seen around the rectum with surrounding presacral fat stranding changes. There is also wall thickening seen around the anal canal. Vascular/Lymphatic: There are no enlarged mesenteric, retroperitoneal, or pelvic lymph nodes. Scattered aortic atherosclerotic calcifications are seen without aneurysmal dilatation. Reproductive: The uterus and adnexa are unremarkable. Other: No evidence of abdominal wall mass or hernia. Musculoskeletal: No acute or significant osseous findings. IMPRESSION: 1. Dilated sigmoid colon and rectum with a large amount of impacted with findings which could be suggestive of proctocolitis. However would recommend direct visualization upon resolution of symptoms to exclude underlying mass lesion. 2.  Aortic Atherosclerosis (ICD10-I70.0). Electronically Signed   By: Jonna Clark M.D.   On: 12/11/2019 23:49   DG Chest Portable 1 View  Result Date: 12/12/2019 CLINICAL DATA:  Hypoxia EXAM: PORTABLE CHEST 1 VIEW COMPARISON:  03/08/2016 FINDINGS: Low lung volumes. Mild cardiomegaly. No consolidation or effusion. No pneumothorax. IMPRESSION: Low lung volumes with mild cardiomegaly. Electronically Signed   By: Jasmine Pang M.D.   On: 12/12/2019 00:02    Subjective:   Patient was seen and examined 12/18/2019, 7:56 AM Patient stable today. No acute distress.  No issues overnight Stable for discharge.  Discharge Exam:    Vitals:   12/17/19 0516 12/17/19 1201 12/17/19 2000 12/18/19 0520   BP: 125/74 125/70 118/65 124/81  Pulse: (!) 102 (!) 106 (!) 102 (!) 110  Resp: 20 16    Temp: 99.4 F (37.4 C) 97.7 F (36.5 C) 98.6 F (37 C) 98.4 F (36.9 C)  TempSrc: Oral  Oral Oral  SpO2: 95% 96% 96% 97%  Weight:      Height:        General: Pt lying comfortably in bed & appears in no obvious distress. Cardiovascular: S1 & S2 heard, RRR, S1/S2 +. No murmurs, rubs, gallops or clicks. No JVD or pedal edema. Respiratory: Clear to auscultation without wheezing, rhonchi or crackles. No increased work of breathing. Abdominal:  Non-distended, non-tender & soft. No organomegaly or masses appreciated. Normal bowel sounds heard. CNS: Alert and oriented. No focal deficits. Extremities: no edema, no cyanosis    The results of significant diagnostics from this hospitalization (including imaging, microbiology, ancillary and laboratory) are listed below for reference.      Microbiology:  Recent Results (from the past 240 hour(s))  Urine culture     Status: None   Collection Time: 12/11/19 10:11 PM   Specimen: Urine, Random  Result Value Ref Range Status   Specimen Description   Final    URINE, RANDOM Performed at [facility], [address] [place], [place] [address]    Special Requests   Final    NONE Performed at [facility], [address] [place], [place] [address]    Culture   Final    NO GROWTH Performed at [facility], [address] [place], [place] [address]    Report Status 12/13/2019 FINAL  Final  Culture, blood (routine x 2)     Status: None   Collection Time: 12/11/19 10:11 PM   Specimen: BLOOD RIGHT FOREARM  Result Value Ref Range Status   Specimen Description   Final    BLOOD RIGHT FOREARM Performed at [facility], [address], [place], [place] [address]    Special Requests   Final    BOTTLES DRAWN AEROBIC AND ANAEROBIC Blood Culture adequate volume   Culture   Final    NO GROWTH 5 DAYS Performed at [facility], [address] [place], [place] [address]    Report Status 12/16/2019 FINAL  Final  Culture, blood (routine x 2)     Status: None   Collection Time: 12/11/19 10:12 PM   Specimen: BLOOD  Result Value Ref Range Status   Specimen Description BLOOD LEFT FA  Final   Special Requests   Final    BOTTLES DRAWN AEROBIC AND ANAEROBIC Blood Culture adequate volume   Culture   Final    NO GROWTH 5 DAYS Performed at [facility], [address] [place], [place] [address]    Report Status 12/16/2019 FINAL  Final  Gastrointestinal Panel by PCR , Stool     Status: Abnormal   Collection Time: 12/11/19 10:12 PM   Specimen: Stool  Result Value Ref Range Status   Campylobacter species NOT DETECTED NOT DETECTED Final   Plesimonas shigelloides NOT DETECTED NOT DETECTED Final   Salmonella species NOT DETECTED NOT DETECTED Final   Yersinia enterocolitica NOT DETECTED NOT DETECTED Final   Vibrio species NOT DETECTED NOT DETECTED Final   Vibrio cholerae NOT DETECTED NOT DETECTED Final   Enteroaggregative E coli (EAEC) NOT DETECTED NOT DETECTED Final   Enteropathogenic E coli (EPEC) DETECTED (A) NOT DETECTED Final    Comment: RESULT CALLED TO, READ BACK BY AND VERIFIED WITH: ashton peters  on 12/12/19 skl    Enterotoxigenic E coli (ETEC) NOT DETECTED NOT DETECTED Final   Shiga like toxin producing E coli (STEC) NOT DETECTED NOT DETECTED Final   Shigella/Enteroinvasive E coli (EIEC) NOT DETECTED NOT DETECTED Final   Cryptosporidium NOT DETECTED NOT DETECTED Final   Cyclospora cayetanensis NOT DETECTED NOT DETECTED Final   Entamoeba histolytica NOT DETECTED NOT DETECTED Final   Giardia lamblia NOT DETECTED NOT DETECTED Final   Adenovirus F40/41 NOT DETECTED NOT DETECTED Final   Astrovirus NOT DETECTED NOT DETECTED Final   Norovirus GI/GII NOT DETECTED NOT DETECTED Final   Rotavirus A NOT DETECTED NOT DETECTED Final   Sapovirus (I, II, IV, and V) NOT DETECTED NOT DETECTED  Final    Comment: Performed at [facility], [address] [place], [place] [address]  SARS Coronavirus 2 by RT PCR (hospital order, performed in [facility] hospital lab) Nasopharyngeal Nasopharyngeal Swab  Status: None   Collection Time: 12/11/19 11:10 PM   Specimen: Nasopharyngeal Swab  Result Value Ref Range Status   SARS Coronavirus 2 NEGATIVE NEGATIVE Final    Comment: (NOTE) SARS-CoV-2 target nucleic acids are NOT DETECTED.  The SARS-CoV-2 RNA is generally detectable in upper and lower respiratory specimens during the acute phase of infection. The lowest concentration of SARS-CoV-2 viral copies this assay can detect is 250 copies / mL. A negative result does not preclude SARS-CoV-2 infection and should not be used as the sole basis for treatment or other patient management decisions.  A negative result may occur with improper specimen collection / handling, submission of specimen other than nasopharyngeal swab, presence of viral mutation(s) within the areas targeted by this assay, and inadequate number of viral copies (<250 copies / mL). A negative result must be combined with clinical observations, patient history, and epidemiological information.  Fact Sheet for Patients:   BoilerBrush.com.cyhttps://www.fda.gov/media/136312/download  Fact Sheet for Healthcare Providers: https://pope.com/https://www.fda.gov/media/136313/download  This test is not yet approved or  cleared by the [place] FDA and has been authorized for detection and/or diagnosis of SARS-CoV-2 by FDA under an Emergency Use Authorization (EUA).  This EUA will remain in effect (meaning this test can be used) for the duration of the COVID-19 declaration under Section 564(b)(1) of the Act, 21 U.S.C. section 360bbb-3(b)(1), unless the authorization is terminated or revoked sooner.  Performed at [facility], [address] [place], [place] [address]   C Difficile Quick Screen w PCR reflex     Status: Abnormal    Collection Time: 12/13/19 11:10 AM   Specimen: STOOL  Result Value Ref Range Status   C Diff antigen POSITIVE (A) NEGATIVE Final   C Diff toxin NEGATIVE NEGATIVE Final   C Diff interpretation Results are indeterminate. See PCR results.  Final    Comment: Performed at [facility], [address] [place], [place] [address]  C. Diff by PCR, Reflexed     Status: Abnormal   Collection Time: 12/13/19 11:10 AM  Result Value Ref Range Status   Toxigenic C. Difficile by PCR POSITIVE (A) NEGATIVE Final    Comment: Positive for toxigenic C. difficile with little to no toxin production. Only treat if clinical presentation suggests symptomatic illness. Performed at [facility], [address] [place], [place] [address]   CULTURE, BLOOD (ROUTINE X 2) w Reflex to ID Panel     Status: None (Preliminary result)   Collection Time: 12/14/19 11:51 AM   Specimen: BLOOD  Result Value Ref Range Status   Specimen Description BLOOD RIGHT ANTECUBITAL  Final   Special Requests   Final    BOTTLES DRAWN AEROBIC AND ANAEROBIC Blood Culture results may not be optimal due to an excessive volume of blood received in culture bottles   Culture   Final    NO GROWTH 4 DAYS Performed at [facility], [address] [place], [place] [address]    Report Status PENDING  Incomplete  CULTURE, BLOOD (ROUTINE X 2) w Reflex to ID Panel     Status: None (Preliminary result)   Collection Time: 12/14/19 11:57 AM   Specimen: BLOOD  Result Value Ref Range Status   Specimen Description BLOOD BLOOD RIGHT HAND  Final   Special Requests   Final    BOTTLES DRAWN AEROBIC AND ANAEROBIC Blood Culture adequate volume   Culture   Final    NO GROWTH 4 DAYS Performed at [facility], [address] [place], [place] [address]    Report Status PENDING  Incomplete     Labs:   CBC: Recent Labs  Lab 12/13/19 0943 12/14/19 1150 12/15/19 0410 12/16/19 0823 12/17/19 0721  WBC 13.1* 11.4* 9.8  9.9 12.2*  NEUTROABS 10.0* 8.3* 6.6 6.4 8.8*  HGB 11.8* 10.4* 10.4* 10.2* 10.6*  HCT 38.5 34.0* 33.9* 33.8* 34.0*  MCV 93.2 93.7 91.9 94.4 91.9  PLT 272 258 254 255 252   Basic Metabolic Panel: Recent Labs  Lab 12/13/19 0450 12/14/19 0506 12/15/19 0410 12/16/19 0823 12/17/19 0721  NA 144 144 142 144 143  K 4.1 4.0 3.8 3.9 3.9  CL 100 107 107 109 108  CO2 GLUCOSE 210* 194* 180* 164* 136*  BUN 24* 27* CREATININE 0.91 0.92 0.65 0.71 0.76  CALCIUM 8.5* 8.2* 7.9* 8.1* 7.9*  MG 3.3*  --   --   --   --   PHOS 2.6  --   --   --   --    Liver Function Tests: Recent Labs  Lab 12/11/19 2211 12/13/19 0450  AST 22 15  ALT 30 19  ALKPHOS 68 53  BILITOT 1.4* 1.6*  PROT 8.3* 6.9  ALBUMIN 4.2 3.3*   BNP (last 3 results) No results for input(s): BNP in the last 8760 hours. Cardiac Enzymes: No results for input(s): CKTOTAL, CKMB, CKMBINDEX, TROPONINI in the last 168 hours. CBG: Recent Labs  Lab 12/16/19 1618 12/16/19 2041 12/17/19 0757 12/17/19 1204 12/17/19 1722  GLUCAP 171* 150* 123* 166* 165*   Hgb A1c No results for input(s): HGBA1C in the last 72 hours. Lipid Profile No results for input(s): CHOL, HDL, LDLCALC, TRIG, CHOLHDL, LDLDIRECT in the last 72 hours. Thyroid function studies No results for input(s): TSH, T4TOTAL, T3FREE, THYROIDAB in the last 72 hours.  Invalid input(s): FREET3 Anemia work up No results for input(s): VITAMINB12, FOLATE, FERRITIN, TIBC, IRON, RETICCTPCT in the last 72 hours. Urinalysis    Component Value Date/Time   COLORURINE YELLOW (A) 12/11/2019 2211   APPEARANCEUR HAZY (A) 12/11/2019 2211   LABSPEC 1.025 12/11/2019 2211   PHURINE 5.0 12/11/2019 2211   GLUCOSEU >=500 (A) 12/11/2019 2211   HGBUR NEGATIVE 12/11/2019 2211   BILIRUBINUR NEGATIVE 12/11/2019 2211   KETONESUR 20 (A) 12/11/2019 2211   PROTEINUR 30 (A) 12/11/2019 2211   UROBILINOGEN 0.2 10/15/2006 1244   NITRITE NEGATIVE 12/11/2019 2211    LEUKOCYTESUR NEGATIVE 12/11/2019 2211         Time coordinating discharge: Over 45 minutes  SIGNED: Kendell Bane, MD, FACP, FHM. [facility],  Please use [email or web address] to Page If 7PM-7AM, please contact night-coverage [email or web address] 12/18/2019, 7:56 AM

## 2019-12-18 NOTE — Progress Notes (Signed)
Molly [name] and [name]. VSS. Pt tolerating diet well. No complaints of nausea or vomiting. IV removed intact, prescriptions given. Pt voices understanding of discharge instructions with no further questions. Patient discharged via EMS  Allergies as of 12/18/2019   No Known Allergies     Medication List    STOP taking these medications   insulin aspart protamine - aspart (70-30) 100 UNIT/ML FlexPen Commonly known as: NOVOLOG 70/30 MIX   Invokana 100 MG Tabs tablet Generic drug: canagliflozin   metFORMIN 500 MG 24 hr tablet Commonly known as: GLUCOPHAGE-XR     TAKE these medications   acetaminophen 325 MG tablet Commonly known as: TYLENOL Take 650 mg by mouth every 6 (six) hours as needed.   acidophilus Caps capsule Take 2 capsules by mouth 3 (three) times daily for 15 days.   albuterol 108 (90 Base) MCG/ACT inhaler Commonly known as: VENTOLIN HFA ALBUTEROL 90 MCG/ACT AERS   amitriptyline 75 MG tablet Commonly known as: ELAVIL Take 75 mg by mouth at bedtime.   aspirin EC 81 MG tablet Take 81 mg by mouth daily.   atorvastatin 20 MG tablet Commonly known as: LIPITOR Take 20 mg by mouth daily at 6 PM.   clopidogrel 75 MG tablet Commonly known as: PLAVIX Take 75 mg by mouth.   diclofenac sodium 1 % Gel Commonly known as: VOLTAREN Apply 2 g topically 4 (four) times daily. To each shoulders   insulin aspart 100 UNIT/ML injection Commonly known as: novoLOG Inject 0-20 Units into the skin 3 (three) times daily with meals.   magnesium oxide 400 MG tablet Commonly known as: MAG-OX Take 400 mg by mouth 2 (two) times daily.   polyethylene glycol 17 g packet Commonly known as: MIRALAX / GLYCOLAX Take 17 g by mouth daily as needed. What changed:   when to take this  reasons to take this   senna-docusate 8.6-50 MG tablet Commonly known as: Senokot-S Take 1 tablet by mouth at bedtime as needed for mild constipation. What changed:   when to take this  reasons  to take this   sitaGLIPtin 100 MG tablet Commonly known as: JANUVIA Take 100 mg by mouth daily.   SUPER B COMPLEX PO Take 1 tablet by mouth daily.   traZODone 50 MG tablet Commonly known as: DESYREL Take 50 mg by mouth at bedtime.   vancomycin 125 MG capsule Commonly known as: VANCOCIN Take 1 capsule (125 mg total) by mouth 4 (four) times daily for 10 days.   Vitamin D3 25 MCG (1000 UT) Caps Take 1,000 Units by mouth daily.       Vitals:   12/18/19 0520 12/18/19 1151  BP: 124/81 (!) 141/77  Pulse: (!) 110 (!) 112  Resp:  17  Temp: 98.4 F (36.9 C) 99 F (37.2 C)  SpO2: 97% 99%    Molly [name]

## 2019-12-19 LAB — CULTURE, BLOOD (ROUTINE X 2)
Culture: NO GROWTH
Culture: NO GROWTH
Special Requests: ADEQUATE

## 2019-12-26 ENCOUNTER — Emergency Department
Admission: EM | Admit: 2019-12-26 | Discharge: 2019-12-26 | Disposition: A | Discharge status: Home / Self Care | Payer: Medicare HMO | Source: Home / Self Care | Attending: Emergency Medicine | Admitting: Emergency Medicine

## 2019-12-26 ENCOUNTER — Emergency Department: Payer: Medicare HMO

## 2019-12-26 DIAGNOSIS — Z7982 Long term (current) use of aspirin: Secondary | ICD-10-CM | POA: Insufficient documentation

## 2019-12-26 DIAGNOSIS — R1084 Generalized abdominal pain: Secondary | ICD-10-CM

## 2019-12-26 DIAGNOSIS — K625 Hemorrhage of anus and rectum: Secondary | ICD-10-CM | POA: Diagnosis not present

## 2019-12-26 DIAGNOSIS — I10 Essential (primary) hypertension: Secondary | ICD-10-CM | POA: Insufficient documentation

## 2019-12-26 DIAGNOSIS — A0472 Enterocolitis due to Clostridium difficile, not specified as recurrent: Secondary | ICD-10-CM

## 2019-12-26 DIAGNOSIS — K649 Unspecified hemorrhoids: Secondary | ICD-10-CM | POA: Insufficient documentation

## 2019-12-26 DIAGNOSIS — Z87891 Personal history of nicotine dependence: Secondary | ICD-10-CM | POA: Insufficient documentation

## 2019-12-26 DIAGNOSIS — K5130 Ulcerative (chronic) rectosigmoiditis without complications: Secondary | ICD-10-CM | POA: Diagnosis not present

## 2019-12-26 DIAGNOSIS — Z7984 Long term (current) use of oral hypoglycemic drugs: Secondary | ICD-10-CM | POA: Insufficient documentation

## 2019-12-26 DIAGNOSIS — E119 Type 2 diabetes mellitus without complications: Secondary | ICD-10-CM | POA: Insufficient documentation

## 2019-12-26 DIAGNOSIS — J449 Chronic obstructive pulmonary disease, unspecified: Secondary | ICD-10-CM | POA: Insufficient documentation

## 2019-12-26 LAB — COMPREHENSIVE METABOLIC PANEL
ALT: 43 U/L (ref 0–44)
AST: 51 U/L — ABNORMAL HIGH (ref 15–41)
Albumin: 2.3 g/dL — ABNORMAL LOW (ref 3.5–5.0)
Alkaline Phosphatase: 53 U/L (ref 38–126)
Anion gap: 9 (ref 5–15)
BUN: 10 mg/dL (ref 8–23)
CO2: 32 mmol/L (ref 22–32)
Calcium: 8.5 mg/dL — ABNORMAL LOW (ref 8.9–10.3)
Chloride: 93 mmol/L — ABNORMAL LOW (ref 98–111)
Creatinine, Ser: 0.6 mg/dL (ref 0.44–1.00)
GFR calc Af Amer: >60 mL/min (ref >60)
GFR calc non Af Amer: >60 mL/min (ref >60)
Glucose, Bld: 124 mg/dL — ABNORMAL HIGH (ref 70–99)
Potassium: 4.4 mmol/L (ref 3.5–5.1)
Sodium: 134 mmol/L — ABNORMAL LOW (ref 135–145)
Total Bilirubin: 0.9 mg/dL (ref 0.3–1.2)
Total Protein: 6.9 g/dL (ref 6.5–8.1)

## 2019-12-26 LAB — CBC
HCT: 31.8 % — ABNORMAL LOW (ref 36.0–46.0)
Hemoglobin: 10.3 g/dL — ABNORMAL LOW (ref 12.0–15.0)
MCH: 29.1 pg (ref 26.0–34.0)
MCHC: 32.4 g/dL (ref 30.0–36.0)
MCV: 89.8 fL (ref 80.0–100.0)
Platelets: 380 10*3/uL (ref 150–400)
RBC: 3.54 MIL/uL — ABNORMAL LOW (ref 3.87–5.11)
RDW: 15.6 % — ABNORMAL HIGH (ref 11.5–15.5)
WBC: 8 10*3/uL (ref 4.0–10.5)
nRBC: 0 % (ref 0.0–0.2)

## 2019-12-26 LAB — LIPASE, BLOOD: Lipase: 23 U/L (ref 11–51)

## 2019-12-26 MED ORDER — DOCUSATE SODIUM 100 MG PO CAPS: 100.0000 mg | ORAL_CAPSULE | Freq: Two times a day (BID) | ORAL | 2 refills | Status: DC

## 2019-12-26 MED ORDER — HYDROCORTISONE ACETATE 25 MG RE SUPP: 25.0000 mg | Freq: Two times a day (BID) | RECTAL | 1 refills | Status: AC

## 2019-12-26 MED ORDER — IOHEXOL 300 MG/ML  SOLN
100.0000 mL | Freq: Once | INTRAMUSCULAR | Status: AC | PRN
  Administered 2019-12-26: 100 mL via INTRAVENOUS

## 2019-12-26 MED ORDER — LACTATED RINGERS IV BOLUS
1000.0000 mL | Freq: Once | INTRAVENOUS | Status: AC
  Administered 2019-12-26: 1000 mL via INTRAVENOUS

## 2019-12-26 NOTE — ED Notes (Signed)
[facility]  CALLED  FOR  TRANSPORT  TO  [facility] 

## 2019-12-26 NOTE — ED Triage Notes (Addendum)
Pt to ED via [facility] from [facility]. Per facility pt received an enema yesterday for a small ileus. After facility stating pt started having bright red blood from her rectum.   Pt cdiff +. Pt with hx COPD and DM. Pt wears 2L [place] chronically. Per facility pt's presentation his her baseline mental status. Pt c/o of abdominal pain and alert to self only.

## 2019-12-26 NOTE — ED Provider Notes (Signed)
[facility] Emergency Department Provider Note   ____________________________________________   First MD Initiated Contact with Patient 12/26/19 1143     (approximate)  I have reviewed the triage vital signs and the nursing notes.   HISTORY  Chief Complaint Rectal Bleeding   HPI Molly [name] is a 74 y.o. female with past medical history of COPD on 2 L, hypertension, diabetes, and hyperlipidemia who presents to the ED for rectal bleeding.  Patient was recently admitted to the hospital for sepsis, C. difficile colitis, as well as proctocolitis.  She was subsequently discharged to [facility] and has been dealing with some constipation since then.  She received an enema yesterday and staff noticed some bright red blood in her stool today.  History is limited as patient is oriented to person only at baseline per EMS.  She currently complains of abdominal pain, but denies any other complaints.  She is unable to localize her abdominal pain.        Past Medical History:  Diagnosis Date  . Anxiety   . COPD (chronic obstructive pulmonary disease) (HCC)   . Diabetes (HCC)   . Hemorrhoid   . HLD (hyperlipidemia)   . HTN (hypertension)   . Stroke [facility]) 2008   left weakness    Patient Active Problem List   Diagnosis Date Noted  . C. difficile colitis 12/14/2019  . Acute colitis 12/12/2019  . Pressure injury of skin 04/28/2016  . Acute GI bleeding 04/27/2016  . Anemia, posthemorrhagic, acute   . Blood in stool   . Gastritis without bleeding   . Involuntary movements   . Acute respiratory failure (HCC) 03/05/2016  . Acute encephalopathy 02/06/2016  . Acute cerebrovascular accident (CVA) (HCC)   . Altered mental status   . 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 Cellulitis of left lower extremity 11/19/2014  . COPD (chronic obstructive pulmonary disease) (HCC) 11/19/2014  . HTN (hypertension) 11/19/2014  . HLD (hyperlipidemia) 11/19/2014  . Sepsis (HCC)  11/19/2014  . Diabetes mellitus (HCC) 05/21/2014  . Chronic anemia 05/21/2014    Past Surgical History:  Procedure Laterality Date  . AMPUTATION Left 05/22/2014   Procedure: AMPUTATION RAY LEFT FOURTH AND FIFTH ;  Surgeon: Toni Arthurs, MD;  Location: [facility] OR;  Service: Orthopedics;  Laterality: Left;  . CATARACT EXTRACTION    . COLONOSCOPY  2004  . COLONOSCOPY WITH PROPOFOL N/A 04/29/2016   Procedure: COLONOSCOPY WITH PROPOFOL;  Surgeon: Wyline Mood, MD;  Location: [facility] ENDOSCOPY;  Service: Gastroenterology;  Laterality: N/A;  . ESOPHAGOGASTRODUODENOSCOPY (EGD) WITH PROPOFOL N/A 04/27/2016   Procedure: ESOPHAGOGASTRODUODENOSCOPY (EGD) WITH PROPOFOL;  Surgeon: Midge Minium, MD;  Location: [facility] ENDOSCOPY;  Service: Endoscopy;  Laterality: N/A;    Prior to Admission medications   Medication Sig Start Date End Date Taking? Authorizing Provider  acidophilus (RISAQUAD) CAPS capsule Take 2 capsules by mouth 3 (three) times daily for 15 days. 12/18/19 01/02/20 Yes Shahmehdi, Seyed A, MD  amitriptyline (ELAVIL) 75 MG tablet Take 75 mg by mouth at bedtime.    Yes [provider]  aspirin EC 81 MG tablet Take 81 mg by mouth daily.  02/28/09  Yes [provider]  atorvastatin (LIPITOR) 20 MG tablet Take 20 mg by mouth daily at 6 PM.  12/07/19  Yes [provider]  B Complex-C (SUPER B COMPLEX PO) Take 1 tablet by mouth daily.    Yes [provider]  Cholecalciferol (VITAMIN D3) 1000 UNITS CAPS Take 1,000 Units by mouth daily.  Yes [provider]  clopidogrel (PLAVIX) 75 MG tablet Take 75 mg by mouth.   Yes [provider]  diclofenac sodium (VOLTAREN) 1 % GEL Apply 2 g topically 4 (four) times daily. To each shoulders   Yes [provider]  magnesium oxide (MAG-OX) 400 MG tablet Take 400 mg by mouth 2 (two) times daily.   Yes [provider]  sitaGLIPtin (JANUVIA) 100 MG tablet Take 100 mg by mouth daily.   Yes [provider]   traZODone (DESYREL) 50 MG tablet Take 50 mg by mouth at bedtime.  09/11/19  Yes [provider]  vancomycin (VANCOCIN) 125 MG capsule Take 1 capsule (125 mg total) by mouth 4 (four) times daily for 10 days. 12/18/19 12/28/19 Yes Shahmehdi, Gemma Payor, MD  acetaminophen (TYLENOL) 325 MG tablet Take 650 mg by mouth every 6 (six) hours as needed.    [provider]  albuterol (PROVENTIL HFA;VENTOLIN HFA) 108 (90 Base) MCG/ACT inhaler ALBUTEROL 90 MCG/ACT AERS 05/20/09   [provider]  docusate sodium (COLACE) 100 MG capsule Take 1 capsule (100 mg total) by mouth 2 (two) times daily. 12/26/19 12/25/20  Chesley Noon, MD  hydrocortisone (ANUSOL-HC) 25 MG suppository Place 1 suppository (25 mg total) rectally every 12 (twelve) hours. 12/26/19 12/25/20  Chesley Noon, MD  insulin aspart (NOVOLOG) 100 UNIT/ML injection Inject 0-20 Units into the skin 3 (three) times daily with meals. Patient taking differently: Inject 0-20 Units into the skin as needed.  12/18/19   Shahmehdi, Gemma Payor, MD  polyethylene glycol (MIRALAX / GLYCOLAX) 17 g packet Take 17 g by mouth daily as needed. Patient not taking: Reported on 12/26/2019 12/18/19   Kendell Bane, MD  senna-docusate (SENOKOT-S) 8.6-50 MG tablet Take 1 tablet by mouth at bedtime as needed for mild constipation. 12/18/19 01/17/20  Kendell Bane, MD    Allergies Patient has no known allergies.  Family History  Problem Relation Age of Onset  . Hyperlipidemia Mother   . Varicose Veins Mother   . Deep vein thrombosis Father   . Stroke Father   . Breast cancer Paternal Aunt   . Stroke Maternal Grandmother   . Stroke Paternal Grandmother     Social History Social History   Tobacco Use  . Smoking status: Former Smoker    Packs/day: 0.50    Years: 2.00    Pack years: 1.00    Types: Cigarettes    Quit date: 11/18/1968    Years since quitting: 51.1  . Smokeless tobacco: Never Used  Substance Use Topics  . Alcohol use: No     Alcohol/week: 0.0 standard drinks    Comment: seldom  . Drug use: No    Review of Systems  Constitutional: No fever/chills Eyes: No visual changes. ENT: No sore throat. Cardiovascular: Denies chest pain. Respiratory: Denies shortness of breath. Gastrointestinal: Positive for abdominal pain.  No nausea, no vomiting.  No diarrhea.  Positive for constipation.  Positive for rectal bleeding. Genitourinary: Negative for dysuria. Musculoskeletal: Negative for back pain. Skin: Negative for rash. Neurological: Negative for headaches, focal weakness or numbness.  ____________________________________________   PHYSICAL EXAM:  VITAL SIGNS: ED Triage Vitals  Enc Vitals Group     BP 12/26/19 1139 131/68     Pulse Rate 12/26/19 1139 87     Resp 12/26/19 1141 20     Temp 12/26/19 1139 (!) 97.5 F (36.4 C)     Temp Source 12/26/19 1139 Axillary     SpO2 12/26/19  1139 100 %     Weight --      Height --      Head Circumference --      Peak Flow --      Pain Score --      Pain Loc --      Pain Edu? --      Excl. in GC? --     Constitutional: Alert and oriented to person, but not place or time. Eyes: Conjunctivae are normal. Head: Atraumatic. Nose: No congestion/rhinnorhea. Mouth/Throat: Mucous membranes are moist. Neck: Normal ROM Cardiovascular: Normal rate, regular rhythm. Grossly normal heart sounds. Respiratory: Normal respiratory effort.  No retractions. Lungs CTAB. Gastrointestinal: Soft and diffusely tender to palpation with no rebound or guarding. No distention.  Large hemorrhoids noted with stigmata of bleeding but no active hemorrhage. Genitourinary: deferred Musculoskeletal: No lower extremity tenderness nor edema. Neurologic:  Normal speech and language. No gross focal neurologic deficits are appreciated. Skin:  Skin is warm, dry and intact. No rash noted. Psychiatric: Mood and affect are normal. Speech and behavior are  normal.  ____________________________________________   LABS (all labs ordered are listed, but only abnormal results are displayed)  Labs Reviewed  COMPREHENSIVE METABOLIC PANEL - Abnormal; Notable for the following components:      Result Value   Sodium 134 (*)    Chloride 93 (*)    Glucose, Bld 124 (*)    Calcium 8.5 (*)    Albumin 2.3 (*)    AST 51 (*)    All other components within normal limits  CBC - Abnormal; Notable for the following components:   RBC 3.54 (*)    Hemoglobin 10.3 (*)    HCT 31.8 (*)    RDW 15.6 (*)    All other components within normal limits  LIPASE, BLOOD  POC OCCULT BLOOD, ED  TYPE AND SCREEN    PROCEDURES  Procedure(s) performed (including Critical Care):  Procedures   ____________________________________________   INITIAL IMPRESSION / ASSESSMENT AND PLAN / ED COURSE       74 year old female with past medical history of hypertension, hyperlipidemia, diabetes, COPD on 2 L, and recent diagnosis of C. difficile who presents to the ED for rectal bleeding after receiving an enema yesterday.  On exam, she has large hemorrhoids that were clearly bleeding previously but did not appear to be actively bleeding.  Blood work is reassuring with stable H&H, no fever or leukocytosis to suggest significant recurrent infection.  CT scan was performed given her abdominal tenderness and shows similar proctocolitis but with improved stool burden compared to prior.  Over greater than 3 hours of observation here in the ED, she has not had recurrence of bleeding and is appropriate for discharge home.  She may continue treatment for C. difficile colitis at nursing facility, we will add Colace and Anusol to assist with treatment of hemorrhoids.  Patient's husband was counseled to have her return to the ED for worsening bleeding or other concerning symptoms.  Patient and husband agree with plan.      ____________________________________________   FINAL CLINICAL  IMPRESSION(S) / ED DIAGNOSES  Final diagnoses:  Bleeding hemorrhoids  C. difficile colitis  Generalized abdominal pain     ED Discharge Orders         Ordered    docusate sodium (COLACE) 100 MG capsule  2 times daily        12/26/19 1447    hydrocortisone (ANUSOL-HC) 25 MG suppository  Every 12 hours  12/26/19 1447           Note:  This document was prepared using Dragon voice recognition software and may include unintentional dictation errors.   Chesley Noon, MD 12/26/19 [phone]

## 2019-12-27 ENCOUNTER — Encounter: Payer: Self-pay | Admitting: Emergency Medicine

## 2019-12-27 ENCOUNTER — Other Ambulatory Visit: Payer: Self-pay

## 2019-12-27 ENCOUNTER — Emergency Department: Payer: Medicare HMO

## 2019-12-27 ENCOUNTER — Inpatient Hospital Stay
Admission: EM | Admit: 2019-12-27 | Discharge: 2020-01-04 | DRG: 385 | Disposition: A | Discharge status: Skilled Nursing Facility | Payer: Medicare HMO | Source: Skilled Nursing Facility | Attending: Internal Medicine | Admitting: Internal Medicine

## 2019-12-27 DIAGNOSIS — Z7902 Long term (current) use of antithrombotics/antiplatelets: Secondary | ICD-10-CM

## 2019-12-27 DIAGNOSIS — A0472 Enterocolitis due to Clostridium difficile, not specified as recurrent: Secondary | ICD-10-CM | POA: Diagnosis not present

## 2019-12-27 DIAGNOSIS — D62 Acute posthemorrhagic anemia: Secondary | ICD-10-CM | POA: Diagnosis present

## 2019-12-27 DIAGNOSIS — Z89422 Acquired absence of other left toe(s): Secondary | ICD-10-CM | POA: Diagnosis not present

## 2019-12-27 DIAGNOSIS — N950 Postmenopausal bleeding: Secondary | ICD-10-CM | POA: Diagnosis present

## 2019-12-27 DIAGNOSIS — R5383 Other fatigue: Secondary | ICD-10-CM

## 2019-12-27 DIAGNOSIS — E669 Obesity, unspecified: Secondary | ICD-10-CM | POA: Diagnosis not present

## 2019-12-27 DIAGNOSIS — R531 Weakness: Secondary | ICD-10-CM | POA: Diagnosis not present

## 2019-12-27 DIAGNOSIS — R54 Age-related physical debility: Secondary | ICD-10-CM | POA: Diagnosis present

## 2019-12-27 DIAGNOSIS — Z823 Family history of stroke: Secondary | ICD-10-CM | POA: Diagnosis not present

## 2019-12-27 DIAGNOSIS — K6289 Other specified diseases of anus and rectum: Secondary | ICD-10-CM | POA: Diagnosis not present

## 2019-12-27 DIAGNOSIS — K649 Unspecified hemorrhoids: Secondary | ICD-10-CM | POA: Diagnosis present

## 2019-12-27 DIAGNOSIS — N939 Abnormal uterine and vaginal bleeding, unspecified: Secondary | ICD-10-CM

## 2019-12-27 DIAGNOSIS — Z83438 Family history of other disorder of lipoprotein metabolism and other lipidemia: Secondary | ICD-10-CM

## 2019-12-27 DIAGNOSIS — R338 Other retention of urine: Secondary | ICD-10-CM

## 2019-12-27 DIAGNOSIS — I152 Hypertension secondary to endocrine disorders: Secondary | ICD-10-CM | POA: Diagnosis not present

## 2019-12-27 DIAGNOSIS — F419 Anxiety disorder, unspecified: Secondary | ICD-10-CM | POA: Diagnosis present

## 2019-12-27 DIAGNOSIS — G9341 Metabolic encephalopathy: Secondary | ICD-10-CM | POA: Diagnosis present

## 2019-12-27 DIAGNOSIS — Z794 Long term (current) use of insulin: Secondary | ICD-10-CM | POA: Diagnosis not present

## 2019-12-27 DIAGNOSIS — E1159 Type 2 diabetes mellitus with other circulatory complications: Secondary | ICD-10-CM | POA: Diagnosis not present

## 2019-12-27 DIAGNOSIS — K5909 Other constipation: Secondary | ICD-10-CM | POA: Diagnosis present

## 2019-12-27 DIAGNOSIS — I69354 Hemiplegia and hemiparesis following cerebral infarction affecting left non-dominant side: Secondary | ICD-10-CM | POA: Diagnosis not present

## 2019-12-27 DIAGNOSIS — E119 Type 2 diabetes mellitus without complications: Secondary | ICD-10-CM | POA: Diagnosis not present

## 2019-12-27 DIAGNOSIS — K922 Gastrointestinal hemorrhage, unspecified: Secondary | ICD-10-CM | POA: Diagnosis present

## 2019-12-27 DIAGNOSIS — Z20822 Contact with and (suspected) exposure to covid-19: Secondary | ICD-10-CM | POA: Diagnosis present

## 2019-12-27 DIAGNOSIS — K5130 Ulcerative (chronic) rectosigmoiditis without complications: Secondary | ICD-10-CM | POA: Diagnosis present

## 2019-12-27 DIAGNOSIS — I639 Cerebral infarction, unspecified: Secondary | ICD-10-CM | POA: Diagnosis present

## 2019-12-27 DIAGNOSIS — Z8673 Personal history of transient ischemic attack (TIA), and cerebral infarction without residual deficits: Secondary | ICD-10-CM | POA: Diagnosis not present

## 2019-12-27 DIAGNOSIS — R58 Hemorrhage, not elsewhere classified: Secondary | ICD-10-CM

## 2019-12-27 DIAGNOSIS — Z7982 Long term (current) use of aspirin: Secondary | ICD-10-CM

## 2019-12-27 DIAGNOSIS — Z87891 Personal history of nicotine dependence: Secondary | ICD-10-CM

## 2019-12-27 DIAGNOSIS — E1169 Type 2 diabetes mellitus with other specified complication: Secondary | ICD-10-CM

## 2019-12-27 DIAGNOSIS — R000 Tachycardia, unspecified: Secondary | ICD-10-CM | POA: Diagnosis present

## 2019-12-27 DIAGNOSIS — K625 Hemorrhage of anus and rectum: Secondary | ICD-10-CM

## 2019-12-27 DIAGNOSIS — I10 Essential (primary) hypertension: Secondary | ICD-10-CM

## 2019-12-27 DIAGNOSIS — L89152 Pressure ulcer of sacral region, stage 2: Secondary | ICD-10-CM | POA: Diagnosis present

## 2019-12-27 DIAGNOSIS — J449 Chronic obstructive pulmonary disease, unspecified: Secondary | ICD-10-CM | POA: Diagnosis present

## 2019-12-27 DIAGNOSIS — Z6836 Body mass index (BMI) 36.0-36.9, adult: Secondary | ICD-10-CM

## 2019-12-27 DIAGNOSIS — N840 Polyp of corpus uteri: Secondary | ICD-10-CM | POA: Diagnosis present

## 2019-12-27 DIAGNOSIS — R339 Retention of urine, unspecified: Secondary | ICD-10-CM | POA: Diagnosis present

## 2019-12-27 DIAGNOSIS — Z803 Family history of malignant neoplasm of breast: Secondary | ICD-10-CM

## 2019-12-27 DIAGNOSIS — E1165 Type 2 diabetes mellitus with hyperglycemia: Secondary | ICD-10-CM | POA: Diagnosis present

## 2019-12-27 DIAGNOSIS — E785 Hyperlipidemia, unspecified: Secondary | ICD-10-CM | POA: Diagnosis present

## 2019-12-27 DIAGNOSIS — K921 Melena: Secondary | ICD-10-CM | POA: Diagnosis not present

## 2019-12-27 LAB — COMPREHENSIVE METABOLIC PANEL
ALT: 35 U/L (ref 0–44)
AST: 40 U/L (ref 15–41)
Albumin: 2.1 g/dL — ABNORMAL LOW (ref 3.5–5.0)
Alkaline Phosphatase: 47 U/L (ref 38–126)
Anion gap: 11 (ref 5–15)
BUN: 8 mg/dL (ref 8–23)
CO2: 32 mmol/L (ref 22–32)
Calcium: 8.4 mg/dL — ABNORMAL LOW (ref 8.9–10.3)
Chloride: 92 mmol/L — ABNORMAL LOW (ref 98–111)
Creatinine, Ser: 0.58 mg/dL (ref 0.44–1.00)
GFR calc Af Amer: >60 mL/min (ref >60)
GFR calc non Af Amer: >60 mL/min (ref >60)
Glucose, Bld: 144 mg/dL — ABNORMAL HIGH (ref 70–99)
Potassium: 4.2 mmol/L (ref 3.5–5.1)
Sodium: 135 mmol/L (ref 135–145)
Total Bilirubin: 1 mg/dL (ref 0.3–1.2)
Total Protein: 6.4 g/dL — ABNORMAL LOW (ref 6.5–8.1)

## 2019-12-27 LAB — CBC
HCT: 29.4 % — ABNORMAL LOW (ref 36.0–46.0)
Hemoglobin: 9.4 g/dL — ABNORMAL LOW (ref 12.0–15.0)
MCH: 28.6 pg (ref 26.0–34.0)
MCHC: 32 g/dL (ref 30.0–36.0)
MCV: 89.4 fL (ref 80.0–100.0)
Platelets: 386 10*3/uL (ref 150–400)
RBC: 3.29 MIL/uL — ABNORMAL LOW (ref 3.87–5.11)
RDW: 15.7 % — ABNORMAL HIGH (ref 11.5–15.5)
WBC: 8.7 10*3/uL (ref 4.0–10.5)
nRBC: 0 % (ref 0.0–0.2)

## 2019-12-27 MED ORDER — IOHEXOL 350 MG/ML SOLN
100.0000 mL | Freq: Once | INTRAVENOUS | Status: AC | PRN
  Administered 2019-12-27: 100 mL via INTRAVENOUS

## 2019-12-27 MED ORDER — INSULIN ASPART 100 UNIT/ML ~~LOC~~ SOLN
0.0000 [IU] | SUBCUTANEOUS | Status: DC
  Administered 2019-12-29 – 2019-12-30 (×2): 2 [IU] via SUBCUTANEOUS
  Administered 2019-12-31 (×2): 3 [IU] via SUBCUTANEOUS
  Administered 2020-01-01 (×4): 2 [IU] via SUBCUTANEOUS
  Administered 2020-01-01: 3 [IU] via SUBCUTANEOUS
  Administered 2020-01-01: 2 [IU] via SUBCUTANEOUS
  Administered 2020-01-02: 3 [IU] via SUBCUTANEOUS
  Administered 2020-01-02: 2 [IU] via SUBCUTANEOUS
  Administered 2020-01-02: 5 [IU] via SUBCUTANEOUS
  Administered 2020-01-02 (×2): 2 [IU] via SUBCUTANEOUS
  Administered 2020-01-03 (×3): 3 [IU] via SUBCUTANEOUS
  Administered 2020-01-03 – 2020-01-04 (×5): 2 [IU] via SUBCUTANEOUS
  Filled 2019-12-27 (×23): qty 1

## 2019-12-27 MED ORDER — ONDANSETRON HCL 4 MG/2ML IJ SOLN: 4.0000 mg | Freq: Four times a day (QID) | INTRAMUSCULAR | Status: DC | PRN

## 2019-12-27 MED ORDER — ACETAMINOPHEN 650 MG RE SUPP: 650.0000 mg | Freq: Four times a day (QID) | RECTAL | Status: DC | PRN

## 2019-12-27 MED ORDER — ONDANSETRON HCL 4 MG PO TABS: 4.0000 mg | ORAL_TABLET | Freq: Four times a day (QID) | ORAL | Status: DC | PRN

## 2019-12-27 MED ORDER — SODIUM CHLORIDE 0.9% FLUSH
3.0000 mL | Freq: Two times a day (BID) | INTRAVENOUS | Status: DC
  Administered 2019-12-27 – 2020-01-04 (×16): 3 mL via INTRAVENOUS

## 2019-12-27 MED ORDER — POLYETHYLENE GLYCOL 3350 17 GM/SCOOP PO POWD
1.0000 | Freq: Once | ORAL | Status: DC
  Filled 2019-12-27 (×2): qty 255

## 2019-12-27 MED ORDER — SODIUM CHLORIDE 0.9 % IV SOLN
10.0000 mL/h | Freq: Once | INTRAVENOUS | Status: AC
  Administered 2019-12-27: 10 mL/h via INTRAVENOUS

## 2019-12-27 MED ORDER — SODIUM CHLORIDE 0.9 % IV BOLUS
1000.0000 mL | Freq: Once | INTRAVENOUS | Status: AC
  Administered 2019-12-27: 1000 mL via INTRAVENOUS

## 2019-12-27 MED ORDER — METRONIDAZOLE IN NACL 5-0.79 MG/ML-% IV SOLN
500.0000 mg | Freq: Once | INTRAVENOUS | Status: AC
  Administered 2019-12-27: 500 mg via INTRAVENOUS
  Filled 2019-12-27: qty 100

## 2019-12-27 MED ORDER — IOHEXOL 9 MG/ML PO SOLN: 500.0000 mL | ORAL | Status: AC

## 2019-12-27 MED ORDER — SODIUM CHLORIDE 0.9 % IV SOLN: INTRAVENOUS | Status: AC

## 2019-12-27 MED ORDER — CIPROFLOXACIN IN D5W 400 MG/200ML IV SOLN
400.0000 mg | Freq: Once | INTRAVENOUS | Status: AC
  Administered 2019-12-28: 400 mg via INTRAVENOUS
  Filled 2019-12-27: qty 200

## 2019-12-27 MED ORDER — ALBUTEROL SULFATE HFA 108 (90 BASE) MCG/ACT IN AERS
2.0000 | INHALATION_SPRAY | RESPIRATORY_TRACT | Status: DC | PRN
  Filled 2019-12-27: qty 6.7

## 2019-12-27 MED ORDER — ACETAMINOPHEN 325 MG PO TABS: 650.0000 mg | ORAL_TABLET | Freq: Four times a day (QID) | ORAL | Status: DC | PRN

## 2019-12-27 NOTE — ED Provider Notes (Addendum)
[facility] Emergency Department Provider Note   ____________________________________________   First MD Initiated Contact with Patient 12/27/19 1714     (approximate)  I have reviewed the triage vital signs and the nursing notes.   HISTORY  Chief Complaint Rectal Bleeding    HPI Molly [name] is a 74 y.o. female patient seen yesterday for rectal bleeding comes back today with more rectal bleeding.  She reports her abdomen is diffusely tender.  She and her husband report its been distended for about a month.  She is not having any nausea or vomiting currently.  Pain she is having is diffuse and mild seems to be mostly achy and worse at 5 palpate her belly.  She is not sure when it started.        Past Medical History:  Diagnosis Date  . Anxiety   . COPD (chronic obstructive pulmonary disease) (HCC)   . Diabetes (HCC)   . Hemorrhoid   . HLD (hyperlipidemia)   . HTN (hypertension)   . Stroke [facility]) 2008   left weakness    Patient Active Problem List   Diagnosis Date Noted  . GI bleeding 12/27/2019  . GI bleed 12/27/2019  . C. difficile colitis 12/14/2019  . Acute colitis 12/12/2019  . Pressure injury of skin 04/28/2016  . Acute GI bleeding 04/27/2016  . Anemia, posthemorrhagic, acute   . Blood in stool   . Gastritis without bleeding   . Involuntary movements   . Acute respiratory failure (HCC) 03/05/2016  . Acute encephalopathy 02/06/2016  . Acute cerebrovascular accident (CVA) (HCC)   . Altered mental status   . 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 Cellulitis of left lower extremity 11/19/2014  . COPD (chronic obstructive pulmonary disease) (HCC) 11/19/2014  . HTN (hypertension) 11/19/2014  . HLD (hyperlipidemia) 11/19/2014  . Sepsis (HCC) 11/19/2014  . Diabetes mellitus (HCC) 05/21/2014  . Chronic anemia 05/21/2014    Past Surgical History:  Procedure Laterality Date  . AMPUTATION Left 05/22/2014   Procedure: AMPUTATION RAY LEFT FOURTH  AND FIFTH ;  Surgeon: Toni Arthurs, MD;  Location: [facility] OR;  Service: Orthopedics;  Laterality: Left;  . CATARACT EXTRACTION    . COLONOSCOPY  2004  . COLONOSCOPY WITH PROPOFOL N/A 04/29/2016   Procedure: COLONOSCOPY WITH PROPOFOL;  Surgeon: Wyline Mood, MD;  Location: [facility] ENDOSCOPY;  Service: Gastroenterology;  Laterality: N/A;  . ESOPHAGOGASTRODUODENOSCOPY (EGD) WITH PROPOFOL N/A 04/27/2016   Procedure: ESOPHAGOGASTRODUODENOSCOPY (EGD) WITH PROPOFOL;  Surgeon: Midge Minium, MD;  Location: [facility] ENDOSCOPY;  Service: Endoscopy;  Laterality: N/A;    Prior to Admission medications   Medication Sig Start Date End Date Taking? Authorizing Provider  acetaminophen (TYLENOL) 325 MG tablet Take 650 mg by mouth every 6 (six) hours as needed.   Yes [provider]  acidophilus (RISAQUAD) CAPS capsule Take 2 capsules by mouth 3 (three) times daily for 15 days. 12/18/19 01/02/20 Yes Shahmehdi, Gemma Payor, MD  albuterol (PROVENTIL HFA;VENTOLIN HFA) 108 (90 Base) MCG/ACT inhaler ALBUTEROL 90 MCG/ACT AERS 05/20/09  Yes [provider]  amitriptyline (ELAVIL) 75 MG tablet Take 75 mg by mouth at bedtime.    Yes [provider]  aspirin EC 81 MG tablet Take 81 mg by mouth daily.  02/28/09  Yes [provider]  atorvastatin (LIPITOR) 20 MG tablet Take 20 mg by mouth daily at 6 PM.  12/07/19  Yes [provider]  B Complex-C (SUPER B COMPLEX PO) Take 1 tablet by mouth daily.  Yes [provider]  Cholecalciferol (VITAMIN D3) 1000 UNITS CAPS Take 1,000 Units by mouth daily.   Yes [provider]  clopidogrel (PLAVIX) 75 MG tablet Take 75 mg by mouth.   Yes [provider]  diclofenac sodium (VOLTAREN) 1 % GEL Apply 2 g topically 4 (four) times daily. To each shoulders   Yes [provider]  docusate sodium (COLACE) 100 MG capsule Take 1 capsule (100 mg total) by mouth 2 (two) times daily. 12/26/19 12/25/20 Yes Chesley Noon, MD  hydrocortisone  (ANUSOL-HC) 25 MG suppository Place 1 suppository (25 mg total) rectally every 12 (twelve) hours. 12/26/19 12/25/20 Yes Chesley Noon, MD  insulin aspart (NOVOLOG) 100 UNIT/ML injection Inject 0-20 Units into the skin 3 (three) times daily with meals. Patient taking differently: Inject 0-20 Units into the skin as needed.  12/18/19  Yes Shahmehdi, Seyed A, MD  magnesium oxide (MAG-OX) 400 MG tablet Take 400 mg by mouth 2 (two) times daily.   Yes [provider]  polyethylene glycol (MIRALAX / GLYCOLAX) 17 g packet Take 17 g by mouth daily as needed.   Yes [provider]  senna-docusate (SENOKOT-S) 8.6-50 MG tablet Take 1 tablet by mouth at bedtime as needed for mild constipation. 12/18/19 01/17/20 Yes Shahmehdi, Seyed A, MD  sitaGLIPtin (JANUVIA) 100 MG tablet Take 100 mg by mouth daily.   Yes [provider]  traZODone (DESYREL) 50 MG tablet Take 50 mg by mouth at bedtime.  09/11/19  Yes [provider]  vancomycin (VANCOCIN) 125 MG capsule Take 1 capsule (125 mg total) by mouth 4 (four) times daily for 10 days. 12/18/19 12/28/19 Yes Shahmehdi, Gemma Payor, MD    Allergies Patient has no known allergies.  Family History  Problem Relation Age of Onset  . Hyperlipidemia Mother   . Varicose Veins Mother   . Deep vein thrombosis Father   . Stroke Father   . Breast cancer Paternal Aunt   . Stroke Maternal Grandmother   . Stroke Paternal Grandmother     Social History Social History   Tobacco Use  . Smoking status: Former Smoker    Packs/day: 0.50    Years: 2.00    Pack years: 1.00    Types: Cigarettes    Quit date: 11/18/1968    Years since quitting: 51.1  . Smokeless tobacco: Never Used  Substance Use Topics  . Alcohol use: No    Alcohol/week: 0.0 standard drinks    Comment: seldom  . Drug use: No    Review of Systems  Constitutional: No fever/chills Eyes: No visual changes. ENT: No sore throat. Cardiovascular: Denies chest pain. Respiratory:  Denies shortness of breath. Gastrointestinal: abdominal pain.  No nausea, no vomiting.  No diarrhea.  No constipation. Genitourinary: Negative for dysuria. Musculoskeletal: Negative for back pain. Skin: Negative for rash. Neurological: Negative for headaches, focal weakness  ____________________________________________   PHYSICAL EXAM:  VITAL SIGNS: ED Triage Vitals  Enc Vitals Group     BP 12/27/19 1029 (!) 108/59     Pulse Rate 12/27/19 1029 (!) 101     Resp 12/27/19 1029 15     Temp 12/27/19 1029 97.7 F (36.5 C)     Temp Source 12/27/19 1029 Oral     SpO2 12/27/19 1029 95 %     Weight 12/27/19 0913 209 lb 3.5 oz (94.9 kg)     Height 12/27/19 0913 5\' 5"  (1.651 m)     Head Circumference --      Peak  Flow --      Pain Score 12/27/19 0913 0     Pain Loc --      Pain Edu? --      Excl. in GC? --    Constitutional: Alert and oriented. Well appearing and in no acute distress. Eyes: Conjunctivae are normal. PER Head: Atraumatic. Nose: No congestion/rhinnorhea. Mouth/Throat: Mucous membranes are moist.  Oropharynx non-erythematous. Neck: No stridor Cardiovascular: Normal rate, regular rhythm. Grossly normal heart sounds.  Good peripheral circulation. Respiratory: Normal respiratory effort.  No retractions. Lungs CTAB. Gastrointestinal: Soft mildly diffusely tender no distention. No abdominal bruits.  Rectal: Patient has stool and a large amount of dark red blood present in her diaper.  There is a little bit coming out currently. Musculoskeletal: No lower extremity tenderness trace bilateral edema.   Neurologic:  Normal speech and language. No gross focal neurologic deficits are appreciated. No gait instability. Skin:  Skin is warm, dry and intact. No rash noted. .  ____________________________________________   LABS (all labs ordered are listed, but only abnormal results are displayed)  Labs Reviewed  COMPREHENSIVE METABOLIC PANEL - Abnormal; Notable for the following  components:      Result Value   Chloride 92 (*)    Glucose, Bld 144 (*)    Calcium 8.4 (*)    Total Protein 6.4 (*)    Albumin 2.1 (*)    All other components within normal limits  CBC - Abnormal; Notable for the following components:   RBC 3.29 (*)    Hemoglobin 9.4 (*)    HCT 29.4 (*)    RDW 15.7 (*)    All other components within normal limits  HEMOGLOBIN A1C  HEMOGLOBIN AND HEMATOCRIT, BLOOD  BASIC METABOLIC PANEL  CBC  PROTIME-INR  APTT  TYPE AND SCREEN  PREPARE RBC (CROSSMATCH)   ____________________________________________  EKG  ____________________________________________  RADIOLOGY  ED MD interpretation: CT yesterday was consistent with rectal stercoral colitis.  She is currently bleeding with diffuse pain.  I will do a CT angio.  I discussed this with Dr. Allegra LaiVanga GI. CT angio today shows only proctitis.  I reviewed the film. Official radiology report(s): CT Angio Abd/Pel W and/or Wo Contrast  Result Date: 12/27/2019 CLINICAL DATA:  GI bleeding. EXAM: CTA ABDOMEN AND PELVIS WITHOUT AND WITH CONTRAST TECHNIQUE: Multidetector CT imaging of the abdomen and pelvis was performed using the standard protocol during bolus administration of intravenous contrast. Multiplanar reconstructed images and MIPs were obtained and reviewed to evaluate the vascular anatomy. CONTRAST:  100mL OMNIPAQUE IOHEXOL 350 MG/ML SOLN COMPARISON:  CT abdomen pelvis 1 day prior FINDINGS: VASCULAR Aorta: Abdominal aorta normal caliber.  No dissection or aneurysm. Celiac: Celiac trunk is widely patent. SMA: SMA is widely patent.  No branch occlusion. Renals: Bilateral pain single renal arteries with ostial calcification. IMA: Widely patent Inflow: Normal Proximal Outflow: Normal Veins: Normal Review of the MIP images confirms the above findings. NON-VASCULAR Lower chest: Lung bases are Hepatobiliary: Normal liver.  Gallbladder mildly distended. Pancreas: Normal pancreatic parenchyma. Spleen: Spleen normal.  Adrenals/Urinary Tract: Adrenal glands normal. Kidneys enhance symmetrically. No obstructive uropathy. Bladder contains excreted contrast from CT 1 day prior. Stomach/Bowel: Evaluation of the colon demonstrates no active arterial extravasation to localize gastrointestinal bleeding. Again demonstrated inflammation along the serosal surface of the distal rectum unchanged from prior. Moderate volume stool throughout the entirety of the colon. The stomach, duodenum small-bowel normal. No pneumatosis or portal venous gas. Lymphatic: No lymphadenopathy Reproductive: Unremarkable Other: No intraperitoneal free air  free fluid Musculoskeletal: No aggressive osseous lesion IMPRESSION: VASCULAR 1. No evidence of active gastrointestinal bleeding on CTA imaging. 2. Celiac trunk, SMA and IMA are widely patent. NON-VASCULAR 1. Inflammation along the serosal surface of the rectum not changed from day prior. Findings suggest proctitis. 2. No pneumatosis or evidence for ischemia. Electronically Signed   By: Genevive Bi M.D.   On: 12/27/2019 19:38    ____________________________________________   PROCEDURES  Procedure(s) performed (including Critical Care): Critical care time 27 minutes.  This includes reviewing the patient's old records and evaluating the patient carefully evaluating the patient CT and lab work and discussing her case in detail with the hospitalist and GI.  Procedures   ____________________________________________   INITIAL IMPRESSION / ASSESSMENT AND PLAN / ED COURSE  Dr. Allegra Lai also recommends p.o. MiraLAX to try and clean out her colon.  Patient is more tachycardic than she was earlier with a heart rate of 122.  Blood pressure slightly higher now about 110 systolic.  I will give the patient at least 1 unit of blood IV.  Patient has already dropped her hemoglobin by one-point since yesterday and the blood test today was done hours preop prior to me seeing her and finding the fresh blood in her  diaper.  Was at least a half a pint of fresh blood in her diaper.  Patient has become more tachycardic since the initial CBC was done as well.              ____________________________________________   FINAL CLINICAL IMPRESSION(S) / ED DIAGNOSES  Final diagnoses:  Rectal bleeding     ED Discharge Orders    None      *Please note:  Molly [name] was evaluated in Emergency Department on 12/27/2019 for the symptoms described in the history of present illness. She was evaluated in the context of the global COVID-19 pandemic, which necessitated consideration that the patient might be at risk for infection with the SARS-CoV-2 virus that causes COVID-19. Institutional protocols and algorithms that pertain to the evaluation of patients at risk for COVID-19 are in a state of rapid change based on information released by regulatory bodies including the CDC and federal and state organizations. These policies and algorithms were followed during the patient's care in the ED.  Some ED evaluations and interventions may be delayed as a result of limited staffing during and the pandemic.*   Note:  This document was prepared using Dragon voice recognition software and may include unintentional dictation errors.    Arnaldo Natal, MD 12/27/19 2320    Arnaldo Natal, MD 12/27/19 [id number]

## 2019-12-27 NOTE — ED Triage Notes (Signed)
Pt in via EMS from [facility]. Pt c/o rectal bleed and was seen in the ED for same yesterday. This am staff reports pt had large blood clots when they changed her, 125/60, HR 76, 97% on 3L. Pt always on oxygen.

## 2019-12-27 NOTE — ED Notes (Signed)
Attempted to call report x 1  

## 2019-12-27 NOTE — ED Notes (Signed)
Pt cleaned of stool incontinence. Stool bloody with clots. Linens changed. MD notified.

## 2019-12-27 NOTE — ED Notes (Signed)
Report given to Chrissy RN

## 2019-12-27 NOTE — Progress Notes (Signed)
Cross cover Note Nurse reports patient now with several stools with bright red blood.  BP stable, Slight increase in tachycardia.  Admission order/bed request changed to progressive unit.  H & H after transfusion complete

## 2019-12-27 NOTE — H&P (Signed)
[facility] History and Physical   Patient: Molly [name] CZY:[id number]   PCP: Ethelda Chick, MD DOB: 09/10/1945   DOA: 12/27/2019   DOS: 12/27/2019   DOS: the patient was seen and examined on 12/27/2019  Patient coming from: The patient is coming from SNF  Chief Complaint: Lower GI bleeding  HPI: Molly [name] is a 74 y.o. female with Past medical history of CVA with residual left-sided weakness, HTN, HLD, IDDM, COPD, anxiety, history of C. Difficile, presented at [facility] ED due to lower GI bleeding.  Patient was seen in the ED yesterday due to GI bleeding, CT scan was done which showed  stercoral Proctocolitis, Hb was 10.3 no significant drop so patient was discharged back to rehab.  Today again patient had significant lower GI bleeding, dark-colored stool so patient was sent back to the ED for work-up.  Patient is not a very good historian, ED physicians chart reviewed and patient's husband added to the story.  Patient denies any abdominal pain, no nausea vomiting, denies any chest pain or palpitations, no shortness of breath.  ED physician discussed with GI Dr. Allegra Lai who recommended p.o. MiraLAX to try and clean her colon.  ED physician ordered PRBC transfusion.  So patient admitted for further management as below.   ED Course:  Mild tachycardia heart rate 104, blood pressure soft 108/59, O2 saturation 95% on room air WBC 8.7 within normal range, hemoglobin 9.4, dropped from 10.8 baseline, yesterday hemoglobin was 10.3, platelet within normal range.  Mild hyperglycemia blood glucose 144, calcium 8.4 could be secondary to hypoalbuminemia, renal function stable creatinine 1.58.  CTA abdomen and pelvis VASCULAR 1. No evidence of active gastrointestinal bleeding on CTA imaging. 2. Celiac trunk, SMA and IMA are widely patent. NON-VASCULAR 1. Inflammation along the serosal surface of the rectum not changed from day prior. Findings suggest proctitis. 2. No pneumatosis or evidence  for ischemia.     Review of Systems: as mentioned in the history of present illness.  All other systems reviewed and are negative.  Past Medical History:  Diagnosis Date  . Anxiety   . COPD (chronic obstructive pulmonary disease) (HCC)   . Diabetes (HCC)   . Hemorrhoid   . HLD (hyperlipidemia)   . HTN (hypertension)   . Stroke [facility]) 2008   left weakness   Past Surgical History:  Procedure Laterality Date  . AMPUTATION Left 05/22/2014   Procedure: AMPUTATION RAY LEFT FOURTH AND FIFTH ;  Surgeon: Toni Arthurs, MD;  Location: [facility] OR;  Service: Orthopedics;  Laterality: Left;  . CATARACT EXTRACTION    . COLONOSCOPY  2004  . COLONOSCOPY WITH PROPOFOL N/A 04/29/2016   Procedure: COLONOSCOPY WITH PROPOFOL;  Surgeon: Wyline Mood, MD;  Location: [facility] ENDOSCOPY;  Service: Gastroenterology;  Laterality: N/A;  . ESOPHAGOGASTRODUODENOSCOPY (EGD) WITH PROPOFOL N/A 04/27/2016   Procedure: ESOPHAGOGASTRODUODENOSCOPY (EGD) WITH PROPOFOL;  Surgeon: Midge Minium, MD;  Location: [facility] ENDOSCOPY;  Service: Endoscopy;  Laterality: N/A;   Social History:  reports that she quit smoking about 51 years ago. Her smoking use included cigarettes. She has a 1.00 pack-year smoking history. She has never used smokeless tobacco. She reports that she does not drink alcohol and does not use drugs.  No Known Allergies   Family history reviewed and not pertinent Family History  Problem Relation Age of Onset  . Hyperlipidemia Mother   . Varicose Veins Mother   . Deep vein thrombosis Father   . Stroke Father   . Breast cancer Paternal Aunt   .  Stroke Maternal Grandmother   . Stroke Paternal Grandmother      Prior to Admission medications   Medication Sig Start Date End Date Taking? Authorizing Provider  acetaminophen (TYLENOL) 325 MG tablet Take 650 mg by mouth every 6 (six) hours as needed.    [provider]  acidophilus (RISAQUAD) CAPS capsule Take 2 capsules by mouth 3 (three) times daily for 15  days. 12/18/19 01/02/20  ShahmehdiGemma Payor, MD  albuterol (PROVENTIL HFA;VENTOLIN HFA) 108 (90 Base) MCG/ACT inhaler ALBUTEROL 90 MCG/ACT AERS 05/20/09   [provider]  amitriptyline (ELAVIL) 75 MG tablet Take 75 mg by mouth at bedtime.     [provider]  aspirin EC 81 MG tablet Take 81 mg by mouth daily.  02/28/09   [provider]  atorvastatin (LIPITOR) 20 MG tablet Take 20 mg by mouth daily at 6 PM.  12/07/19   [provider]  B Complex-C (SUPER B COMPLEX PO) Take 1 tablet by mouth daily.     [provider]  Cholecalciferol (VITAMIN D3) 1000 UNITS CAPS Take 1,000 Units by mouth daily.    [provider]  clopidogrel (PLAVIX) 75 MG tablet Take 75 mg by mouth.    [provider]  diclofenac sodium (VOLTAREN) 1 % GEL Apply 2 g topically 4 (four) times daily. To each shoulders    [provider]  docusate sodium (COLACE) 100 MG capsule Take 1 capsule (100 mg total) by mouth 2 (two) times daily. 12/26/19 12/25/20  Chesley Noon, MD  hydrocortisone (ANUSOL-HC) 25 MG suppository Place 1 suppository (25 mg total) rectally every 12 (twelve) hours. 12/26/19 12/25/20  Chesley Noon, MD  insulin aspart (NOVOLOG) 100 UNIT/ML injection Inject 0-20 Units into the skin 3 (three) times daily with meals. Patient taking differently: Inject 0-20 Units into the skin as needed.  12/18/19   Shahmehdi, Gemma Payor, MD  magnesium oxide (MAG-OX) 400 MG tablet Take 400 mg by mouth 2 (two) times daily.    [provider]  polyethylene glycol (MIRALAX / GLYCOLAX) 17 g packet Take 17 g by mouth daily as needed. Patient not taking: Reported on 12/26/2019 12/18/19   Kendell Bane, MD  senna-docusate (SENOKOT-S) 8.6-50 MG tablet Take 1 tablet by mouth at bedtime as needed for mild constipation. 12/18/19 01/17/20  Shahmehdi, Gemma Payor, MD  sitaGLIPtin (JANUVIA) 100 MG tablet Take 100 mg by mouth daily.    [provider]  traZODone (DESYREL)  50 MG tablet Take 50 mg by mouth at bedtime.  09/11/19   [provider]  vancomycin (VANCOCIN) 125 MG capsule Take 1 capsule (125 mg total) by mouth 4 (four) times daily for 10 days. 12/18/19 12/28/19  Kendell Bane, MD    Physical Exam: Vitals:   12/27/19 0913 12/27/19 1029 12/27/19 1947  BP:  (!) 108/59 114/66  Pulse:  (!) 101 (!) 104  Resp:  15 17  Temp:  97.7 F (36.5 C) 98.5 F (36.9 C)  TempSrc:  Oral   SpO2:  95%   Weight: 94.9 kg    Height: 5\' 5"  (1.651 m)      General: alert and oriented to time, place, and person. Appear in mild distress, affect appropriate Eyes: PERRLA, Conjunctiva normal ENT: Oral Mucosa Clear, moist  Neck: no JVD, no Abnormal Mass Or lumps Cardiovascular: S1 and S2 Present, no Murmur, peripheral pulses symmetrical Respiratory: good respiratory effort, Bilateral Air entry equal , no signs of accessory muscle use, Clear to Auscultation, no  Crackles, no wheezes Abdomen: Bowel Sound present, Soft and non tenderness, no hernia Skin: no rashes  Extremities: no Pedal edema, no calf tenderness Neurologic: Left-sided residual weakness due to prior stroke Gait not checked due to patient safety concerns  Data Reviewed: I have personally reviewed and interpreted labs, imaging as discussed below.  CBC: Recent Labs  Lab 12/26/19 1143 12/27/19 1020  WBC 8.0 8.7  HGB 10.3* 9.4*  HCT 31.8* 29.4*  MCV 89.8 89.4  PLT 380 386   Basic Metabolic Panel: Recent Labs  Lab 12/26/19 1143 12/27/19 1020  NA 134* 135  K 4.4 4.2  CL 93* 92*  CO2 32 32  GLUCOSE 124* 144*  BUN 10 8  CREATININE 0.60 0.58  CALCIUM 8.5* 8.4*   GFR: Estimated Creatinine Clearance: 71.4 mL/min (by C-G formula based on SCr of 0.58 mg/dL). Liver Function Tests: Recent Labs  Lab 12/26/19 1143 12/27/19 1020  AST 51* 40  ALT 43 35  ALKPHOS 53 47  BILITOT 0.9 1.0  PROT 6.9 6.4*  ALBUMIN 2.3* 2.1*   Recent Labs  Lab 12/26/19 1143  LIPASE 23   No results for  input(s): AMMONIA in the last 168 hours. Coagulation Profile: No results for input(s): INR, PROTIME in the last 168 hours. Cardiac Enzymes: No results for input(s): CKTOTAL, CKMB, CKMBINDEX, TROPONINI in the last 168 hours. BNP (last 3 results) No results for input(s): PROBNP in the last 8760 hours. HbA1C: No results for input(s): HGBA1C in the last 72 hours. CBG: No results for input(s): GLUCAP in the last 168 hours. Lipid Profile: No results for input(s): CHOL, HDL, LDLCALC, TRIG, CHOLHDL, LDLDIRECT in the last 72 hours. Thyroid Function Tests: No results for input(s): TSH, T4TOTAL, FREET4, T3FREE, THYROIDAB in the last 72 hours. Anemia Panel: No results for input(s): VITAMINB12, FOLATE, FERRITIN, TIBC, IRON, RETICCTPCT in the last 72 hours. Urine analysis:    Component Value Date/Time   COLORURINE YELLOW (A) 12/11/2019 2211   APPEARANCEUR HAZY (A) 12/11/2019 2211   LABSPEC 1.025 12/11/2019 2211   PHURINE 5.0 12/11/2019 2211   GLUCOSEU >=500 (A) 12/11/2019 2211   HGBUR NEGATIVE 12/11/2019 2211   BILIRUBINUR NEGATIVE 12/11/2019 2211   KETONESUR 20 (A) 12/11/2019 2211   PROTEINUR 30 (A) 12/11/2019 2211   UROBILINOGEN 0.2 10/15/2006 1244   NITRITE NEGATIVE 12/11/2019 2211   LEUKOCYTESUR NEGATIVE 12/11/2019 2211    Radiological Exams on Admission: CT Abdomen Pelvis W Contrast  Result Date: 12/26/2019 CLINICAL DATA:  Acute abdominal pain. Patient received enema 1 day ago EXAM: CT ABDOMEN AND PELVIS WITH CONTRAST TECHNIQUE: Multidetector CT imaging of the abdomen and pelvis was performed using the standard protocol following bolus administration of intravenous contrast. CONTRAST:  100mL OMNIPAQUE IOHEXOL 300 MG/ML  SOLN COMPARISON:  CT 12/11/2019 FINDINGS: Lower chest: Mild dependent left basilar atelectasis. Lung bases otherwise clear. Hepatobiliary: Unremarkable appearance of the liver. No focal liver lesion is seen. There is a small amount of layering hyperdense material  dependently within the gallbladder lumen which may represent a small amount of biliary sludge or small stones. No pericholecystic inflammatory changes by CT. No biliary dilatation. Pancreas: Unremarkable. No pancreatic ductal dilatation or surrounding inflammatory changes. Spleen: Normal in size without focal abnormality. Adrenals/Urinary Tract: Unremarkable adrenal glands. 1.2 cm cyst within the interpolar region of the right kidney posteriorly. Kidneys appear otherwise within normal limits. No hydronephrosis. Unremarkable ureters. Urinary bladder within normal limits. Stomach/Bowel: Moderate volume of stool within the rectosigmoid colon, decreased from the prior exam. There is persistent  circumferential colonic wall thickening and pericolonic fat stranding surrounding the rectum and distal sigmoid colon. Trace amount of fluid within the presacral space. No adjacent organized fluid collection or evidence of perforation. Moderate to large volume of stool throughout the remaining colon. Circumferential perianal mucosal thickening is again seen. Stomach and small bowel are within normal limits. No dilated loops of small bowel. Vascular/Lymphatic: Scattered aortoiliac atherosclerotic calcifications without aneurysm. No abdominopelvic lymphadenopathy. Reproductive: Uterus and bilateral adnexa are unremarkable. Other: No pneumoperitoneum.  No abdominal wall hernia. Musculoskeletal: Similar degenerative changes thoracolumbar spine. No new or acute osseous findings. IMPRESSION: 1. Persistent circumferential colonic wall thickening and pericolonic fat stranding surrounding the rectum and distal sigmoid colon. No adjacent organized fluid collection or evidence of perforation. Findings are most suggestive of stercoral proctocolitis. 2. Volume of stool within the rectosigmoid colon has slightly decreased from prior. Moderate to large volume of stool throughout the remaining colon. 3. Small amount of layering hyperdense  material within the gallbladder lumen which may represent a small amount of biliary sludge or small stones. No pericholecystic inflammatory changes by CT. 4. Aortic atherosclerosis. (ICD10-I70.0). Electronically Signed   By: Duanne Guess D.O.   On: 12/26/2019 13:40   CT Angio Abd/Pel W and/or Wo Contrast  Result Date: 12/27/2019 CLINICAL DATA:  GI bleeding. EXAM: CTA ABDOMEN AND PELVIS WITHOUT AND WITH CONTRAST TECHNIQUE: Multidetector CT imaging of the abdomen and pelvis was performed using the standard protocol during bolus administration of intravenous contrast. Multiplanar reconstructed images and MIPs were obtained and reviewed to evaluate the vascular anatomy. CONTRAST:  OMNIPAQUE IOHEXOL 350 MG/ML SOLN COMPARISON:  CT abdomen pelvis 1 day prior FINDINGS: VASCULAR Aorta: Abdominal aorta normal caliber.  No dissection or aneurysm. Celiac: Celiac trunk is widely patent. SMA: SMA is widely patent.  No branch occlusion. Renals: Bilateral pain single renal arteries with ostial calcification. IMA: Widely patent Inflow: Normal Proximal Outflow: Normal Veins: Normal Review of the MIP images confirms the above findings. NON-VASCULAR Lower chest: Lung bases are Hepatobiliary: Normal liver.  Gallbladder mildly distended. Pancreas: Normal pancreatic parenchyma. Spleen: Spleen normal. Adrenals/Urinary Tract: Adrenal glands normal. Kidneys enhance symmetrically. No obstructive uropathy. Bladder contains excreted contrast from CT 1 day prior. Stomach/Bowel: Evaluation of the colon demonstrates no active arterial extravasation to localize gastrointestinal bleeding. Again demonstrated inflammation along the serosal surface of the distal rectum unchanged from prior. Moderate volume stool throughout the entirety of the colon. The stomach, duodenum small-bowel normal. No pneumatosis or portal venous gas. Lymphatic: No lymphadenopathy Reproductive: Unremarkable Other: No intraperitoneal free air free fluid  Musculoskeletal: No aggressive osseous lesion IMPRESSION: VASCULAR 1. No evidence of active gastrointestinal bleeding on CTA imaging. 2. Celiac trunk, SMA and IMA are widely patent. NON-VASCULAR 1. Inflammation along the serosal surface of the rectum not changed from day prior. Findings suggest proctitis. 2. No pneumatosis or evidence for ischemia. Electronically Signed   By: Genevive Bi M.D.   On: 12/27/2019 19:38    I reviewed all nursing notes, pharmacy notes, vitals, pertinent old records.  Assessment/Plan  Principal Problem:   GI bleeding Active Problems:   Diabetes mellitus (HCC)   COPD (chronic obstructive pulmonary disease) (HCC)   HTN (hypertension)   HLD (hyperlipidemia)   Acute cerebrovascular accident (CVA) (HCC)    # GI bleeding, most likely lower GI bleeding due to stercoral proctocolitis Hb 9.3, dropped from 10.3 yesterday Patient will get 1 unit of PRBC transfusion ordered by ED physician, monitor H&H and transfuse as needed Patient was given a  dose of Cipro and Flagyl, follow GI for antibiotics tomorrow a.m. Continue MiraLAX as per GI recommendation Follow GI for further recommendation   #IDDM, started on Humalog sliding scale Health Levemir for now secondary to n.p.o. status Check hemoglobin A1c  #HTN, HLD, CVA with residual left-sided weakness Held aspirin and Plavix for now due to active GI bleeding resume when cleared by GI Resume statin tomorrow Blood pressure soft, continue to monitor Started IV fluid for overnight hydration  #History of COPD, no exacerbation noticed on exam Continue albuterol as needed    Nutrition: NPO due to GI bleeding and possible GI work-up tomorrow a.m.   DVT Prophylaxis: SCD, pharmacological prophylaxis contraindicated due to Due to GI bleeding  Advance goals of care discussion: Full code   Consults: GI, called by ED physician.    Family Communication: family was present at bedside, at the time of interview.   Opportunity was given to ask question and all questions were answered satisfactorily.  Disposition: Admitted as inpatient, med-surge unit. Likely to be discharged , in 2-3  days .  I have discussed plan of care as described above with RN and patient/family.  Severity of Illness: The appropriate patient status for this patient is INPATIENT. Inpatient status is judged to be reasonable and necessary in order to provide the required intensity of service to ensure the patient's safety. The patient's presenting symptoms, physical exam findings, and initial radiographic and laboratory data in the context of their chronic comorbidities is felt to place them at high risk for further clinical deterioration. Furthermore, it is not anticipated that the patient will be medically stable for discharge from the hospital within 2 midnights of admission. The following factors support the patient status of inpatient.   " The patient's presenting symptoms include active GI bleeding. " The worrisome physical exam findings include tachycardia and soft blood pressure. " The initial radiographic and laboratory data are worrisome because of proctocolitis. " The chronic co-morbidities include CVA and IDDM.   * I certify that at the point of admission it is my clinical judgment that the patient will require inpatient hospital care spanning beyond 2 midnights from the point of admission due to high intensity of service, high risk for further deterioration and high frequency of surveillance required.*    Author: Gillis Santa, MD [facility] 12/27/2019 7:58 PM   To reach On-call, see care teams to locate the attending and reach out to them via [email or web address]. If 7PM-7AM, please contact night-coverage If you still have difficulty reaching the attending provider, please page the [facility] (Director on Call) for [facility] on amion for assistance.

## 2019-12-28 DIAGNOSIS — E119 Type 2 diabetes mellitus without complications: Secondary | ICD-10-CM

## 2019-12-28 DIAGNOSIS — K921 Melena: Secondary | ICD-10-CM

## 2019-12-28 DIAGNOSIS — Z8673 Personal history of transient ischemic attack (TIA), and cerebral infarction without residual deficits: Secondary | ICD-10-CM

## 2019-12-28 DIAGNOSIS — I10 Essential (primary) hypertension: Secondary | ICD-10-CM

## 2019-12-28 DIAGNOSIS — A0472 Enterocolitis due to Clostridium difficile, not specified as recurrent: Secondary | ICD-10-CM

## 2019-12-28 LAB — APTT: aPTT: 32 seconds (ref 24–36)

## 2019-12-28 LAB — HEMOGLOBIN AND HEMATOCRIT, BLOOD
HCT: 30.4 % — ABNORMAL LOW (ref 36.0–46.0)
Hemoglobin: 9.6 g/dL — ABNORMAL LOW (ref 12.0–15.0)

## 2019-12-28 LAB — RESPIRATORY PANEL BY RT PCR (FLU A&B, COVID)
Influenza A by PCR: NEGATIVE
Influenza B by PCR: NEGATIVE
SARS Coronavirus 2 by RT PCR: NEGATIVE

## 2019-12-28 LAB — BASIC METABOLIC PANEL
Anion gap: 13 (ref 5–15)
BUN: 11 mg/dL (ref 8–23)
CO2: 25 mmol/L (ref 22–32)
Calcium: 8.1 mg/dL — ABNORMAL LOW (ref 8.9–10.3)
Chloride: 98 mmol/L (ref 98–111)
Creatinine, Ser: 0.64 mg/dL (ref 0.44–1.00)
GFR calc Af Amer: >60 mL/min (ref >60)
GFR calc non Af Amer: >60 mL/min (ref >60)
Glucose, Bld: 112 mg/dL — ABNORMAL HIGH (ref 70–99)
Potassium: 4.2 mmol/L (ref 3.5–5.1)
Sodium: 136 mmol/L (ref 135–145)

## 2019-12-28 LAB — GLUCOSE, CAPILLARY
Glucose-Capillary: 101 mg/dL — ABNORMAL HIGH (ref 70–99)
Glucose-Capillary: 104 mg/dL — ABNORMAL HIGH (ref 70–99)
Glucose-Capillary: 104 mg/dL — ABNORMAL HIGH (ref 70–99)
Glucose-Capillary: 115 mg/dL — ABNORMAL HIGH (ref 70–99)
Glucose-Capillary: 69 mg/dL — ABNORMAL LOW (ref 70–99)
Glucose-Capillary: 75 mg/dL (ref 70–99)
Glucose-Capillary: 96 mg/dL (ref 70–99)

## 2019-12-28 LAB — CBC
HCT: 37.4 % (ref 36.0–46.0)
Hemoglobin: 11.9 g/dL — ABNORMAL LOW (ref 12.0–15.0)
MCH: 29.9 pg (ref 26.0–34.0)
MCHC: 31.8 g/dL (ref 30.0–36.0)
MCV: 94 fL (ref 80.0–100.0)
Platelets: 318 10*3/uL (ref 150–400)
RBC: 3.98 MIL/uL (ref 3.87–5.11)
RDW: 15 % (ref 11.5–15.5)
WBC: 9 10*3/uL (ref 4.0–10.5)
nRBC: 0 % (ref 0.0–0.2)

## 2019-12-28 LAB — PROTIME-INR
INR: 1.1 (ref 0.8–1.2)
Prothrombin Time: 14.1 seconds (ref 11.4–15.2)

## 2019-12-28 LAB — PREPARE RBC (CROSSMATCH)

## 2019-12-28 LAB — HEMOGLOBIN
Hemoglobin: 10.3 g/dL — ABNORMAL LOW (ref 12.0–15.0)
Hemoglobin: 10.8 g/dL — ABNORMAL LOW (ref 12.0–15.0)

## 2019-12-28 LAB — TYPE AND SCREEN
ABO/RH(D): A POS
Antibody Screen: NEGATIVE

## 2019-12-28 LAB — HEMOGLOBIN A1C
Hgb A1c MFr Bld: 7.3 % — ABNORMAL HIGH (ref 4.8–5.6)
Mean Plasma Glucose: 162.81 mg/dL

## 2019-12-28 MED ORDER — POLYETHYLENE GLYCOL 3350 17 G PO PACK: 17.0000 g | PACK | Freq: Every day | ORAL | Status: DC | PRN

## 2019-12-28 MED ORDER — TRAZODONE HCL 50 MG PO TABS
50.0000 mg | ORAL_TABLET | Freq: Every day | ORAL | Status: DC
  Administered 2019-12-28 – 2020-01-03 (×7): 50 mg via ORAL
  Filled 2019-12-28 (×7): qty 1

## 2019-12-28 MED ORDER — ALBUTEROL SULFATE HFA 108 (90 BASE) MCG/ACT IN AERS: 2.0000 | INHALATION_SPRAY | Freq: Four times a day (QID) | RESPIRATORY_TRACT | Status: DC | PRN

## 2019-12-28 MED ORDER — ACETAMINOPHEN 325 MG PO TABS: 650.0000 mg | ORAL_TABLET | Freq: Four times a day (QID) | ORAL | Status: DC | PRN

## 2019-12-28 MED ORDER — VANCOMYCIN 50 MG/ML ORAL SOLUTION
125.0000 mg | Freq: Four times a day (QID) | ORAL | Status: DC
  Administered 2019-12-28 – 2019-12-29 (×5): 125 mg via ORAL
  Filled 2019-12-28 (×10): qty 2.5

## 2019-12-28 MED ORDER — MAGNESIUM OXIDE 400 (241.3 MG) MG PO TABS
400.0000 mg | ORAL_TABLET | Freq: Two times a day (BID) | ORAL | Status: DC
  Administered 2019-12-28 – 2019-12-29 (×2): 400 mg via ORAL
  Filled 2019-12-28 (×2): qty 1

## 2019-12-28 MED ORDER — RISAQUAD PO CAPS
2.0000 | ORAL_CAPSULE | Freq: Three times a day (TID) | ORAL | Status: DC
  Administered 2019-12-28 – 2020-01-04 (×20): 2 via ORAL
  Filled 2019-12-28 (×24): qty 2

## 2019-12-28 MED ORDER — SENNOSIDES-DOCUSATE SODIUM 8.6-50 MG PO TABS: 1.0000 | ORAL_TABLET | Freq: Every evening | ORAL | Status: DC | PRN

## 2019-12-28 MED ORDER — AMITRIPTYLINE HCL 75 MG PO TABS
75.0000 mg | ORAL_TABLET | Freq: Every day | ORAL | Status: DC
  Administered 2019-12-28 – 2020-01-03 (×7): 75 mg via ORAL
  Filled 2019-12-28 (×3): qty 3
  Filled 2019-12-28 (×5): qty 1
  Filled 2019-12-28: qty 3
  Filled 2019-12-28 (×2): qty 1
  Filled 2019-12-28: qty 3
  Filled 2019-12-28: qty 1
  Filled 2019-12-28: qty 3
  Filled 2019-12-28: qty 1

## 2019-12-28 NOTE — ED Notes (Signed)
Pt alert in bed, no distress noted, no stool at this time. Pending decision on plan of care

## 2019-12-28 NOTE — ED Notes (Signed)
Pt had a prolonged apneic episode while sleeping. Pt placed on 2L of oxygen via [place]. NAD noted and oxygen saturation increased back to 99%.

## 2019-12-28 NOTE — Progress Notes (Signed)
Patient ID: Molly [name], female   DOB: 15-Oct-1945, 74 y.o.   MRN: [id number] [facility] PROGRESS NOTE  Molly [name]:[id number] DOB: [birth year] DOA: 12/27/2019 PCP: Ethelda Chick, MD  HPI/Subjective: Patient had to be waking up from sleep.  Did not recall her last dose of Plavix.  No abdominal pain.  No nausea or vomiting.  Objective: Vitals:   12/28/19 1000 12/28/19 1015  BP: 123/65 107/66  Pulse: 98 92  Resp: 17 15  Temp:    SpO2: 100% 100%    Intake/Output Summary (Last 24 hours) at 12/28/2019 1240 Last data filed at 12/28/2019 0231 Gross per 24 hour  Intake 740 ml  Output --  Net 740 ml   Filed Weights   12/27/19 0913  Weight: 94.9 kg    ROS: Review of Systems  Respiratory: Negative for shortness of breath.   Cardiovascular: Negative for chest pain.  Gastrointestinal: Positive for blood in stool. Negative for abdominal pain, nausea and vomiting.   Exam: Physical Exam HENT:     Nose: No mucosal edema.     Mouth/Throat:     Pharynx: No oropharyngeal exudate.  Eyes:     General: Lids are normal.     Conjunctiva/sclera: Conjunctivae normal.  Cardiovascular:     Rate and Rhythm: Normal rate and regular rhythm.     Heart sounds: Normal heart sounds, S1 normal and S2 normal.  Pulmonary:     Breath sounds: No decreased breath sounds, wheezing, rhonchi or rales.  Abdominal:     Palpations: Abdomen is soft.     Tenderness: There is no abdominal tenderness.  Musculoskeletal:     Right ankle: No swelling.     Left ankle: No swelling.  Skin:    General: Skin is warm.     Findings: No rash.  Neurological:     Mental Status: She is alert and oriented to person, place, and time.       Data Reviewed: Basic Metabolic Panel: Recent Labs  Lab 12/26/19 1143 12/27/19 1020 12/28/19 0439  NA 134* 135 136  K 4.4 4.2 4.2  CL 93* 92* 98  CO2 32 32 25  GLUCOSE 124* 144* 112*  BUN 10 8 11   CREATININE 0.60 0.58 0.64  CALCIUM 8.5* 8.4* 8.1*    Liver Function Tests: Recent Labs  Lab 12/26/19 1143 12/27/19 1020  AST 51* 40  ALT 43 35  ALKPHOS 53 47  BILITOT 0.9 1.0  PROT 6.9 6.4*  ALBUMIN 2.3* 2.1*   Recent Labs  Lab 12/26/19 1143  LIPASE 23   CBC: Recent Labs  Lab 12/26/19 1143 12/27/19 1020 12/28/19 0013 12/28/19 0439  WBC 8.0 8.7  --  9.0  HGB 10.3* 9.4* 9.6* 11.9*  HCT 31.8* 29.4* 30.4* 37.4  MCV 89.8 89.4  --  94.0  PLT 380 386  --  318    CBG: Recent Labs  Lab 12/28/19 0022 12/28/19 0448 12/28/19 0743  GLUCAP 104* 115* 104*     Studies: CT Abdomen Pelvis W Contrast  Result Date: 12/26/2019 CLINICAL DATA:  Acute abdominal pain. Patient received enema 1 day ago EXAM: CT ABDOMEN AND PELVIS WITH CONTRAST TECHNIQUE: Multidetector CT imaging of the abdomen and pelvis was performed using the standard protocol following bolus administration of intravenous contrast. CONTRAST:  12/28/2019 OMNIPAQUE IOHEXOL 300 MG/ML  SOLN COMPARISON:  CT 12/11/2019 FINDINGS: Lower chest: Mild dependent left basilar atelectasis. Lung bases otherwise clear. Hepatobiliary: Unremarkable appearance of the liver. No focal liver lesion is seen.  There is a small amount of layering hyperdense material dependently within the gallbladder lumen which may represent a small amount of biliary sludge or small stones. No pericholecystic inflammatory changes by CT. No biliary dilatation. Pancreas: Unremarkable. No pancreatic ductal dilatation or surrounding inflammatory changes. Spleen: Normal in size without focal abnormality. Adrenals/Urinary Tract: Unremarkable adrenal glands. 1.2 cm cyst within the interpolar region of the right kidney posteriorly. Kidneys appear otherwise within normal limits. No hydronephrosis. Unremarkable ureters. Urinary bladder within normal limits. Stomach/Bowel: Moderate volume of stool within the rectosigmoid colon, decreased from the prior exam. There is persistent circumferential colonic wall thickening and pericolonic  fat stranding surrounding the rectum and distal sigmoid colon. Trace amount of fluid within the presacral space. No adjacent organized fluid collection or evidence of perforation. Moderate to large volume of stool throughout the remaining colon. Circumferential perianal mucosal thickening is again seen. Stomach and small bowel are within normal limits. No dilated loops of small bowel. Vascular/Lymphatic: Scattered aortoiliac atherosclerotic calcifications without aneurysm. No abdominopelvic lymphadenopathy. Reproductive: Uterus and bilateral adnexa are unremarkable. Other: No pneumoperitoneum.  No abdominal wall hernia. Musculoskeletal: Similar degenerative changes thoracolumbar spine. No new or acute osseous findings. IMPRESSION: 1. Persistent circumferential colonic wall thickening and pericolonic fat stranding surrounding the rectum and distal sigmoid colon. No adjacent organized fluid collection or evidence of perforation. Findings are most suggestive of stercoral proctocolitis. 2. Volume of stool within the rectosigmoid colon has slightly decreased from prior. Moderate to large volume of stool throughout the remaining colon. 3. Small amount of layering hyperdense material within the gallbladder lumen which may represent a small amount of biliary sludge or small stones. No pericholecystic inflammatory changes by CT. 4. Aortic atherosclerosis. (ICD10-I70.0). Electronically Signed   By: Duanne Guess D.O.   On: 12/26/2019 13:40   CT Angio Abd/Pel W and/or Wo Contrast  Result Date: 12/27/2019 CLINICAL DATA:  GI bleeding. EXAM: CTA ABDOMEN AND PELVIS WITHOUT AND WITH CONTRAST TECHNIQUE: Multidetector CT imaging of the abdomen and pelvis was performed using the standard protocol during bolus administration of intravenous contrast. Multiplanar reconstructed images and MIPs were obtained and reviewed to evaluate the vascular anatomy. CONTRAST:  OMNIPAQUE IOHEXOL 350 MG/ML SOLN COMPARISON:  CT abdomen  pelvis 1 day prior FINDINGS: VASCULAR Aorta: Abdominal aorta normal caliber.  No dissection or aneurysm. Celiac: Celiac trunk is widely patent. SMA: SMA is widely patent.  No branch occlusion. Renals: Bilateral pain single renal arteries with ostial calcification. IMA: Widely patent Inflow: Normal Proximal Outflow: Normal Veins: Normal Review of the MIP images confirms the above findings. NON-VASCULAR Lower chest: Lung bases are Hepatobiliary: Normal liver.  Gallbladder mildly distended. Pancreas: Normal pancreatic parenchyma. Spleen: Spleen normal. Adrenals/Urinary Tract: Adrenal glands normal. Kidneys enhance symmetrically. No obstructive uropathy. Bladder contains excreted contrast from CT 1 day prior. Stomach/Bowel: Evaluation of the colon demonstrates no active arterial extravasation to localize gastrointestinal bleeding. Again demonstrated inflammation along the serosal surface of the distal rectum unchanged from prior. Moderate volume stool throughout the entirety of the colon. The stomach, duodenum small-bowel normal. No pneumatosis or portal venous gas. Lymphatic: No lymphadenopathy Reproductive: Unremarkable Other: No intraperitoneal free air free fluid Musculoskeletal: No aggressive osseous lesion IMPRESSION: VASCULAR 1. No evidence of active gastrointestinal bleeding on CTA imaging. 2. Celiac trunk, SMA and IMA are widely patent. NON-VASCULAR 1. Inflammation along the serosal surface of the rectum not changed from day prior. Findings suggest proctitis. 2. No pneumatosis or evidence for ischemia. Electronically Signed   By: Loura Halt.D.  On: 12/27/2019 19:38    Scheduled Meds: . acidophilus  2 capsule Oral TID  . amitriptyline  75 mg Oral QHS  . insulin aspart  0-15 Units Subcutaneous Q4H  . magnesium oxide  400 mg Oral BID  . sodium chloride flush  3 mL Intravenous Q12H  . traZODone  50 mg Oral QHS  . vancomycin  125 mg Oral QID     Assessment/Plan:   1.  Acute blood loss  anemia, lower GI bleed.  Received 2 units of packed red blood cells with hemoglobin coming up to 11.9.  We will get serial hemoglobins and transfuse as needed.  If has hemorrhage can consider a bleeding scan.  GI consultation.  Holding aspirin and Plavix.  Last dose of Plavix was on 12/26/2019. 2.  Recent C. difficile colitis finishing up vancomycin.  We will continue oral vancomycin and probiotic. 3.  History of stroke holding aspirin and Plavix. 4.  Essential hypertension blood pressure little bit on the lower side.  Continue to monitor. 5.  Type 2 diabetes mellitus.  Continue to hold oral medications and put on sliding scale for now.       Code Status:     Code Status Orders  (From admission, onward)         Start     Ordered   12/27/19 2155  Full code  Continuous        12/27/19 2154        Code Status History    Date Active Date Inactive Code Status Order ID Comments User Context   12/12/2019 0246 12/18/2019 2104 Full Code [id number]  Hannah Beat, MD ED   04/27/2016 0857 04/30/2016 1830 Full Code [id number]  Milagros Loll, MD ED   03/05/2016 1549 03/12/2016 1723 Full Code [id number]  Eugenie Norrie, NP ED   02/06/2016 1532 02/11/2016 1925 Full Code [id number]  Hower, Cletis Athens, MD ED   02/01/2016 2117 02/05/2016 1359 Full Code [id number]  Auburn Bilberry, MD Inpatient   01/14/2016 1401 01/16/2016 2222 Full Code [id number]  Katha Hamming, MD ED   11/19/2014 2240 11/21/2014 2055 Full Code [id number]  Oralia Manis, MD Inpatient   05/22/2014 1831 05/25/2014 2020 Full Code [id number]  Toni Arthurs, MD Inpatient   05/21/2014 2231 05/22/2014 1831 Full Code [id number]  Yevonne Pax, MD Inpatient   Advance Care Planning Activity     Family Communication: Spoke with husband on the phone Disposition Plan: Status is: Inpatient  Dispo: The patient is from: Rehab              Anticipated d/c is to: Rehab              Anticipated d/c date is: Likely will need 3 or 4 days here in the hospital               Patient currently being followed closely for acute blood loss anemia and GI bleed.  Consultants:  gastroneterology  Antibiotics:  Oral vancomycin  Time spent: 28 minutes  [employer]

## 2019-12-28 NOTE — ED Notes (Signed)
Pt changed at this time by this RN, Mac RN, and Cabin crew. Pt chux and brief changed, patient had bright red blood in stool with small bm. NP Randol Kern notified. Pt lifted up in the bed, more comfortable at this time. Will continue to monitor.

## 2019-12-28 NOTE — ED Notes (Addendum)
Note removed that was entered in error.

## 2019-12-28 NOTE — ED Notes (Signed)
Attempted another IV stick for blood draw. Blood obtained but IV not successful.

## 2019-12-28 NOTE — ED Notes (Signed)
Blood transfusion started at this time by this RN.

## 2019-12-28 NOTE — ED Notes (Signed)
This Rn with patient for first 15 minutes of transfusion. Pt tolerated well, no signs of reaction at this time. Blood transfusion increased to 200 mL's/hr.

## 2019-12-28 NOTE — ED Notes (Signed)
Pt diaper clean

## 2019-12-28 NOTE — Consult Note (Addendum)
Molly BouillonVarnita Maury Groninger, MD [address], [address], [place], [place], [id number] [address], [address], [place], [place], [address] Phone: [phone]  Fax: [phone]  Consultation  Referring Provider:     Dr. Fonnie BirkenheadWhiting Primary Care Physician:  Ethelda Chickoberts, Caroline, MD Reason for Consultation:     GI bleeding  Date of Admission:  12/27/2019 Date of Consultation:  12/28/2019         HPI:   Molly [name] is a 74 y.o. female with history of CVA, with residual left-sided weakness brought to the hospital for bright red blood per rectum.  Patient is on Plavix at home.  Last dose was 2 days ago on 12/26/2019 in the morning.  Patient was brought in 2 days ago with similar symptoms but due to hemodynamic stability, and hemoglobin above 10, patient was discharged but due to continued symptoms was brought back in last night.  CT scan in the ER showed "inflammation along the serosal surface of the rectum" suggesting proctitis.  Patient also recently received treatment for C. difficile which was positive on 12/13/2019.  She also had positive EPEC on stool testing on 12/11/2019.  Patient had upper and lower endoscopy in January 2018 by Dr. Servando SnareWohl.  This was done due to rectal bleeding.  Colonoscopy showed decreased vascular pattern and inflammation in the rectum.  Upper endoscopy showed gastric erythema.  Pathology reported "severe chronic active inflammation and ulceration".  Patient had a normal colonoscopy in 2015  Patient followed up with Dr. Servando SnareWohl after her above procedures and recommendations were to follow-up as needed since symptoms had resolved  Past Medical History:  Diagnosis Date  . Anxiety   . COPD (chronic obstructive pulmonary disease) (HCC)   . Diabetes (HCC)   . Hemorrhoid   . HLD (hyperlipidemia)   . HTN (hypertension)   . Stroke [facility](HCC) 2008   left weakness    Past Surgical History:  Procedure Laterality Date  . AMPUTATION Left 05/22/2014   Procedure: AMPUTATION RAY LEFT FOURTH AND  FIFTH ;  Surgeon: Toni ArthursJohn Hewitt, MD;  [facility];  Service: Orthopedics;  Laterality: Left;  . CATARACT EXTRACTION    . COLONOSCOPY  2004  . COLONOSCOPY WITH PROPOFOL N/A 04/29/2016   Procedure: COLONOSCOPY WITH PROPOFOL;  Surgeon: Wyline MoodKiran Anna, MD;  [facility] END[facility]COPY;  Service: Gastroenterology;  Laterality: N/A;  . ESOPHAGOGASTRODUODENOSCOPY (EGD) WITH PROPOFOL N/A 04/27/2016   Procedure: ESOPHAGOGASTRODUODENOSCOPY (EGD) WITH PROPOFOL;  Surgeon: Midge Miniumarren Wohl, MD;  Location: [facility] ENDOSCOPY;  Service: Endoscopy;  Laterality: N/A;    Prior to Admission medications   Medication Sig Start Date End Date Taking? Authorizing Provider  acetaminophen (TYLENOL) 325 MG tablet Take 650 mg by mouth every 6 (six) hours as needed.   Yes [provider]  acidophilus (RISAQUAD) CAPS capsule Take 2 capsules by mouth 3 (three) times daily for 15 days. 12/18/19 01/02/20 Yes Shahmehdi, Gemma PayorSeyed A, MD  albuterol (PROVENTIL HFA;VENTOLIN HFA) 108 (90 Base) MCG/ACT inhaler ALBUTEROL 90 MCG/ACT AERS 05/20/09  Yes [provider]  amitriptyline (ELAVIL) 75 MG tablet Take 75 mg by mouth at bedtime.    Yes [provider]  aspirin EC 81 MG tablet Take 81 mg by mouth daily.  02/28/09  Yes [provider]  atorvastatin (LIPITOR) 20 MG tablet Take 20 mg by mouth daily at 6 PM.  12/07/19  Yes [provider]  B Complex-C (SUPER B COMPLEX PO) Take 1 tablet by mouth daily.    Yes [provider]  Cholecalciferol (  VITAMIN D3) 1000 UNITS CAPS Take 1,000 Units by mouth daily.   Yes [provider]  clopidogrel (PLAVIX) 75 MG tablet Take 75 mg by mouth.   Yes [provider]  diclofenac sodium (VOLTAREN) 1 % GEL Apply 2 g topically 4 (four) times daily. To each shoulders   Yes [provider]  docusate sodium (COLACE) 100 MG capsule Take 1 capsule (100 mg total) by mouth 2 (two) times daily. 12/26/19 12/25/20 Yes Chesley Noon, MD  hydrocortisone  (ANUSOL-HC) 25 MG suppository Place 1 suppository (25 mg total) rectally every 12 (twelve) hours. 12/26/19 12/25/20 Yes Chesley Noon, MD  insulin aspart (NOVOLOG) 100 UNIT/ML injection Inject 0-20 Units into the skin 3 (three) times daily with meals. Patient taking differently: Inject 0-20 Units into the skin as needed.  12/18/19  Yes Shahmehdi, Seyed A, MD  magnesium oxide (MAG-OX) 400 MG tablet Take 400 mg by mouth 2 (two) times daily.   Yes [provider]  polyethylene glycol (MIRALAX / GLYCOLAX) 17 g packet Take 17 g by mouth daily as needed.   Yes [provider]  senna-docusate (SENOKOT-S) 8.6-50 MG tablet Take 1 tablet by mouth at bedtime as needed for mild constipation. 12/18/19 01/17/20 Yes Shahmehdi, Seyed A, MD  sitaGLIPtin (JANUVIA) 100 MG tablet Take 100 mg by mouth daily.   Yes [provider]  traZODone (DESYREL) 50 MG tablet Take 50 mg by mouth at bedtime.  09/11/19  Yes [provider]  vancomycin (VANCOCIN) 125 MG capsule Take 1 capsule (125 mg total) by mouth 4 (four) times daily for 10 days. 12/18/19 12/28/19 Yes Shahmehdi, Gemma Payor, MD    Family History  Problem Relation Age of Onset  . Hyperlipidemia Mother   . Varicose Veins Mother   . Deep vein thrombosis Father   . Stroke Father   . Breast cancer Paternal Aunt   . Stroke Maternal Grandmother   . Stroke Paternal Grandmother      Social History   Tobacco Use  . Smoking status: Former Smoker    Packs/day: 0.50    Years: 2.00    Pack years: 1.00    Types: Cigarettes    Quit date: 11/18/1968    Years since quitting: 51.1  . Smokeless tobacco: Never Used  Substance Use Topics  . Alcohol use: No    Alcohol/week: 0.0 standard drinks    Comment: seldom  . Drug use: No    Allergies as of 12/27/2019  . (No Known Allergies)    Review of Systems:    All systems reviewed and negative except where noted in HPI.   Physical Exam:  Vital signs in last 24 hours: Vitals:   12/28/19  1000 12/28/19 1015 12/28/19 1251 12/28/19 1500  BP: 123/65 107/66 121/63 123/65  Pulse: 98 92 94 91  Resp: 17 15 17 15   Temp:   97.7 F (36.5 C) 97.8 F (36.6 C)  TempSrc:   Oral   SpO2: 100% 100% 100% 100%  Weight:      Height:         General:   Pleasant, cooperative in NAD Head:  Normocephalic and atraumatic. Eyes:   No icterus.   Conjunctiva pink. PERRLA. Ears:  Normal auditory acuity. Neck:  Supple; no masses or thyroidomegaly Lungs: Respirations even and unlabored. Lungs clear to auscultation bilaterally.   No wheezes, crackles, or rhonchi.  Abdomen:  Soft, nondistended, nontender. Normal bowel sounds. No appreciable masses or hepatomegaly.  No rebound or guarding.  Neurologic:  Alert;  grossly normal neurologically. Skin:  Intact without significant lesions or rashes. Cervical Nodes:  No significant cervical adenopathy. Psych:  Alert and cooperative. Normal affect.  LAB RESULTS: Recent Labs    12/26/19 1143 12/26/19 1143 12/27/19 1020 12/27/19 1020 12/28/19 0013 12/28/19 0439 12/28/19 1251  WBC 8.0  --  8.7  --   --  9.0  --   HGB 10.3*   < > 9.4*   < > 9.6* 11.9* 10.3*  HCT 31.8*   < > 29.4*  --  30.4* 37.4  --   PLT 380  --  386  --   --  318  --    < > = values in this interval not displayed.   BMET Recent Labs    12/26/19 1143 12/27/19 1020 12/28/19 0439  NA 134* 135 136  K 4.4 4.2 4.2  CL 93* 92* 98  CO2 32 32 25  GLUCOSE 124* 144* 112*  BUN 10 8 11   CREATININE 0.60 0.58 0.64  CALCIUM 8.5* 8.4* 8.1*   LFT Recent Labs    12/27/19 1020  PROT 6.4*  ALBUMIN 2.1*  AST 40  ALT 35  ALKPHOS 47  BILITOT 1.0   PT/INR Recent Labs    12/28/19 0439  LABPROT 14.1  INR 1.1    STUDIES: CT Angio Abd/Pel W and/or Wo Contrast  Result Date: 12/27/2019 CLINICAL DATA:  GI bleeding. EXAM: CTA ABDOMEN AND PELVIS WITHOUT AND WITH CONTRAST TECHNIQUE: Multidetector CT imaging of the abdomen and pelvis was performed using the standard protocol during  bolus administration of intravenous contrast. Multiplanar reconstructed images and MIPs were obtained and reviewed to evaluate the vascular anatomy. CONTRAST:  12/29/2019 OMNIPAQUE IOHEXOL 350 MG/ML SOLN COMPARISON:  CT abdomen pelvis 1 day prior FINDINGS: VASCULAR Aorta: Abdominal aorta normal caliber.  No dissection or aneurysm. Celiac: Celiac trunk is widely patent. SMA: SMA is widely patent.  No branch occlusion. Renals: Bilateral pain single renal arteries with ostial calcification. IMA: Widely patent Inflow: Normal Proximal Outflow: Normal Veins: Normal Review of the MIP images confirms the above findings. NON-VASCULAR Lower chest: Lung bases are Hepatobiliary: Normal liver.  Gallbladder mildly distended. Pancreas: Normal pancreatic parenchyma. Spleen: Spleen normal. Adrenals/Urinary Tract: Adrenal glands normal. Kidneys enhance symmetrically. No obstructive uropathy. Bladder contains excreted contrast from CT 1 day prior. Stomach/Bowel: Evaluation of the colon demonstrates no active arterial extravasation to localize gastrointestinal bleeding. Again demonstrated inflammation along the serosal surface of the distal rectum unchanged from prior. Moderate volume stool throughout the entirety of the colon. The stomach, duodenum small-bowel normal. No pneumatosis or portal venous gas. Lymphatic: No lymphadenopathy Reproductive: Unremarkable Other: No intraperitoneal free air free fluid Musculoskeletal: No aggressive osseous lesion IMPRESSION: VASCULAR 1. No evidence of active gastrointestinal bleeding on CTA imaging. 2. Celiac trunk, SMA and IMA are widely patent. NON-VASCULAR 1. Inflammation along the serosal surface of the rectum not changed from day prior. Findings suggest proctitis. 2. No pneumatosis or evidence for ischemia. Electronically Signed   By: M.D.   On: 12/27/2019 19:38      Impression / Plan:   Molly [name] is a 74 y.o. y/o female with recent Plavix use, admitted with rectal  bleeding  Patient is still completing treatment for C. difficile colitis and recently also had positive EPEC  After treatment completion for C. difficile colitis, if active bleeding still continues, colonoscopy can be done after patient is 5 days off Plavix.  If no further rebleeding occurs, colonoscopy will  be considered as an outpatient after patient is a little further out from her recent C. difficile treatment, especially given chronic changes noted on previous biopsies in 2018, to allow for follow-up of the same and also of recent CT abnormality on admission  However, if Plavix needs to be resumed prior to discharge, it might be best to proceed with colonoscopy prior to discharge  Patient is currently hemodynamically stable  Moderate stool burden reported on CT imaging.  Given that patient is a unable to provide reliable history, important to monitor her bowel movements by nursing staff closely.  Patient does appear to have chronic constipation.  MiraLAX has been ordered by primary team as once daily as needed.  Would recommend monitoring bowel movements closely and if patient is not having 1-2 soft bowel movements daily, consider changing MiraLAX to around-the-clock and holding for 1-2 loose bowel movements a day.  We will continue to monitor  Thank you for involving me in the care of this patient.      LOS: 1 day   Pasty Spillers, MD  12/28/2019, 3:20 PM

## 2019-12-28 NOTE — ED Notes (Signed)
Diaper change.  She voided and had small amount red bloody stool.  purewick applied.

## 2019-12-28 NOTE — ED Notes (Signed)
Checked on patient.  Diaper is dry.

## 2019-12-29 DIAGNOSIS — K6289 Other specified diseases of anus and rectum: Secondary | ICD-10-CM

## 2019-12-29 DIAGNOSIS — L89152 Pressure ulcer of sacral region, stage 2: Secondary | ICD-10-CM

## 2019-12-29 LAB — TYPE AND SCREEN
ABO/RH(D): A POS
Antibody Screen: NEGATIVE
Unit division: 0
Unit division: 0
Unit division: 0

## 2019-12-29 LAB — HEMOGLOBIN
Hemoglobin: 10.6 g/dL — ABNORMAL LOW (ref 12.0–15.0)
Hemoglobin: 10.4 g/dL — ABNORMAL LOW (ref 12.0–15.0)

## 2019-12-29 LAB — BPAM RBC
Blood Product Expiration Date: 202110272359
Blood Product Expiration Date: 202110272359
Blood Product Expiration Date: 202110272359
ISSUE DATE / TIME: 202109291936
ISSUE DATE / TIME: 202109300227
ISSUE DATE / TIME: 202109301650
Unit Type and Rh: 6200
Unit Type and Rh: 6200
Unit Type and Rh: 6200

## 2019-12-29 LAB — GLUCOSE, CAPILLARY
Glucose-Capillary: 100 mg/dL — ABNORMAL HIGH (ref 70–99)
Glucose-Capillary: 103 mg/dL — ABNORMAL HIGH (ref 70–99)
Glucose-Capillary: 108 mg/dL — ABNORMAL HIGH (ref 70–99)
Glucose-Capillary: 113 mg/dL — ABNORMAL HIGH (ref 70–99)
Glucose-Capillary: 115 mg/dL — ABNORMAL HIGH (ref 70–99)
Glucose-Capillary: 144 mg/dL — ABNORMAL HIGH (ref 70–99)
Glucose-Capillary: 146 mg/dL — ABNORMAL HIGH (ref 70–99)

## 2019-12-29 LAB — BASIC METABOLIC PANEL
Anion gap: 9 (ref 5–15)
BUN: 9 mg/dL (ref 8–23)
CO2: 30 mmol/L (ref 22–32)
Calcium: 8.7 mg/dL — ABNORMAL LOW (ref 8.9–10.3)
Chloride: 96 mmol/L — ABNORMAL LOW (ref 98–111)
Creatinine, Ser: 0.44 mg/dL (ref 0.44–1.00)
GFR calc Af Amer: >60 mL/min (ref >60)
GFR calc non Af Amer: >60 mL/min (ref >60)
Glucose, Bld: 109 mg/dL — ABNORMAL HIGH (ref 70–99)
Potassium: 4.1 mmol/L (ref 3.5–5.1)
Sodium: 135 mmol/L (ref 135–145)

## 2019-12-29 LAB — MRSA PCR SCREENING: MRSA by PCR: POSITIVE — AB

## 2019-12-29 MED ORDER — CHLORHEXIDINE GLUCONATE CLOTH 2 % EX PADS
6.0000 | MEDICATED_PAD | Freq: Every day | CUTANEOUS | Status: AC
  Administered 2019-12-29 – 2020-01-02 (×5): 6 via TOPICAL

## 2019-12-29 MED ORDER — POLYETHYLENE GLYCOL 3350 17 G PO PACK: 17.0000 g | PACK | Freq: Every day | ORAL | Status: DC | PRN

## 2019-12-29 MED ORDER — MUPIROCIN 2 % EX OINT
1.0000 "application " | TOPICAL_OINTMENT | Freq: Two times a day (BID) | CUTANEOUS | Status: AC
  Administered 2019-12-29 – 2020-01-02 (×9): 1 via NASAL
  Filled 2019-12-29 (×2): qty 22

## 2019-12-29 MED ORDER — POLYETHYLENE GLYCOL 3350 17 G PO PACK
17.0000 g | PACK | Freq: Every day | ORAL | Status: DC
  Administered 2019-12-29: 17 g via ORAL
  Filled 2019-12-29: qty 1

## 2019-12-29 NOTE — Care Management Important Message (Signed)
Important Message  Patient Details  Name: Molly [name] MRN: [id number] Date of Birth: Aug 30, 1945   Medicare Important Message Given:  Yes  Initial Medicare IM given by Patient Access Associate on 12/28/2019 at 9:02am.     Johnell Comings 12/29/2019, 10:22 AM

## 2019-12-29 NOTE — Progress Notes (Signed)
Melodie Bouillon, MD [address], [address], [place], [place], [address] [address], [place], [place], [address] Phone: [phone]  Fax: [phone]   Subjective: Patient is still continuing to have diarrhea.  Flowsheets document red bowel movement.  However, hemoglobin has remained stable   Objective: Exam: Vital signs in last 24 hours: Vitals:   12/29/19 0511 12/29/19 0839 12/29/19 1236 12/29/19 1543  BP: 136/83 138/70 129/70 126/76  Pulse: 94 94 92 (!) 111  Resp: 20 16 16    Temp: 98.5 F (36.9 C) 98.5 F (36.9 C) 98.5 F (36.9 C) 98.4 F (36.9 C)  TempSrc: Oral Oral Oral Oral  SpO2: 98% 95% 94% 94%  Weight: 91.2 kg     Height:       Weight change: -3.727 kg  Intake/Output Summary (Last 24 hours) at 12/29/2019 1546 Last data filed at 12/29/2019 0700 Gross per 24 hour  Intake 0 ml  Output 200 ml  Net -200 ml    General: No acute distress Abd: Soft, NT/ND, No HSM Skin: Warm, no rashes Neck: Supple, Trachea midline   Lab Results: Lab Results  Component Value Date   WBC 9.0 12/28/2019   HGB 10.4 (L) 12/29/2019   HCT 37.4 12/28/2019   MCV 94.0 12/28/2019   PLT 318 12/28/2019   Micro Results: Recent Results (from the past 240 hour(s))  MRSA PCR Screening     Status: Abnormal   Collection Time: 12/28/19  7:30 PM   Specimen: Nasal Mucosa; Nasopharyngeal  Result Value Ref Range Status   MRSA by PCR POSITIVE (A) NEGATIVE Final    Comment:        The GeneXpert MRSA Assay (FDA approved for NASAL specimens only), is one component of a comprehensive MRSA colonization surveillance program. It is not intended to diagnose MRSA infection nor to guide or monitor treatment for MRSA infections. CRITICAL RESULT CALLED TO, READ BACK BY AND VERIFIED WITH: DESIREE DONSON @ [phone] 12/29/19 RH Performed at [facility], [address], [place], [place]   Respiratory Panel by RT PCR (Flu A&B, Covid) - Nasal Mucosa     Status: None    Collection Time: 12/28/19  8:00 PM   Specimen: Nasal Mucosa; Nasopharyngeal  Result Value Ref Range Status   SARS Coronavirus 2 by RT PCR NEGATIVE NEGATIVE Final    Comment: (NOTE) SARS-CoV-2 target nucleic acids are NOT DETECTED.  The SARS-CoV-2 RNA is generally detectable in upper respiratoy specimens during the acute phase of infection. The lowest concentration of SARS-CoV-2 viral copies this assay can detect is 131 copies/mL. A negative result does not preclude SARS-Cov-2 infection and should not be used as the sole basis for treatment or other patient management decisions. A negative result may occur with  improper specimen collection/handling, submission of specimen other than nasopharyngeal swab, presence of viral mutation(s) within the areas targeted by this assay, and inadequate number of viral copies (<131 copies/mL). A negative result must be combined with clinical observations, patient history, and epidemiological information. The expected result is Negative.  Fact Sheet for Patients:  12/30/19  Fact Sheet for Healthcare Providers:  https://www.moore.com/  This test is no t yet approved or cleared by the https://www.young.biz/ FDA and  has been authorized for detection and/or diagnosis of SARS-CoV-2 by FDA under an Emergency Use Authorization (EUA). This EUA will remain  in effect (meaning this test can be used) for the duration of the COVID-19 declaration under Section 564(b)(1) of the Act, 21 U.S.C.  section 360bbb-3(b)(1), unless the authorization is terminated or revoked sooner.     Influenza A by PCR NEGATIVE NEGATIVE Final   Influenza B by PCR NEGATIVE NEGATIVE Final    Comment: (NOTE) The Xpert Xpress SARS-CoV-2/FLU/RSV assay is intended as an aid in  the diagnosis of influenza from Nasopharyngeal swab specimens and  should not be used as a sole basis for treatment. Nasal washings and  aspirates are unacceptable  for Xpert Xpress SARS-CoV-2/FLU/RSV  testing.  Fact Sheet for Patients: https://www.moore.com/  Fact Sheet for Healthcare Providers: https://www.young.biz/  This test is not yet approved or cleared by the [place] FDA and  has been authorized for detection and/or diagnosis of SARS-CoV-2 by  FDA under an Emergency Use Authorization (EUA). This EUA will remain  in effect (meaning this test can be used) for the duration of the  Covid-19 declaration under Section 564(b)(1) of the Act, 21  U.S.C. section 360bbb-3(b)(1), unless the authorization is  terminated or revoked. Performed at [facility], [address], [place], [place] [address]    Studies/Results: CT Angio Abd/Pel W and/or Wo Contrast  Result Date: 12/27/2019 CLINICAL DATA:  GI bleeding. EXAM: CTA ABDOMEN AND PELVIS WITHOUT AND WITH CONTRAST TECHNIQUE: Multidetector CT imaging of the abdomen and pelvis was performed using the standard protocol during bolus administration of intravenous contrast. Multiplanar reconstructed images and MIPs were obtained and reviewed to evaluate the vascular anatomy. CONTRAST:  OMNIPAQUE IOHEXOL 350 MG/ML SOLN COMPARISON:  CT abdomen pelvis 1 day prior FINDINGS: VASCULAR Aorta: Abdominal aorta normal caliber.  No dissection or aneurysm. Celiac: Celiac trunk is widely patent. SMA: SMA is widely patent.  No branch occlusion. Renals: Bilateral pain single renal arteries with ostial calcification. IMA: Widely patent Inflow: Normal Proximal Outflow: Normal Veins: Normal Review of the MIP images confirms the above findings. NON-VASCULAR Lower chest: Lung bases are Hepatobiliary: Normal liver.  Gallbladder mildly distended. Pancreas: Normal pancreatic parenchyma. Spleen: Spleen normal. Adrenals/Urinary Tract: Adrenal glands normal. Kidneys enhance symmetrically. No obstructive uropathy. Bladder contains excreted contrast from CT 1 day prior. Stomach/Bowel:  Evaluation of the colon demonstrates no active arterial extravasation to localize gastrointestinal bleeding. Again demonstrated inflammation along the serosal surface of the distal rectum unchanged from prior. Moderate volume stool throughout the entirety of the colon. The stomach, duodenum small-bowel normal. No pneumatosis or portal venous gas. Lymphatic: No lymphadenopathy Reproductive: Unremarkable Other: No intraperitoneal free air free fluid Musculoskeletal: No aggressive osseous lesion IMPRESSION: VASCULAR 1. No evidence of active gastrointestinal bleeding on CTA imaging. 2. Celiac trunk, SMA and IMA are widely patent. NON-VASCULAR 1. Inflammation along the serosal surface of the rectum not changed from day prior. Findings suggest proctitis. 2. No pneumatosis or evidence for ischemia. Electronically Signed   By: Genevive Bi M.D.   On: 12/27/2019 19:38   Medications:  Scheduled Meds: . acidophilus  2 capsule Oral TID  . amitriptyline  75 mg Oral QHS  . Chlorhexidine Gluconate Cloth  6 each Topical Q0600  . insulin aspart  0-15 Units Subcutaneous Q4H  . mupirocin ointment  1 application Nasal BID  . sodium chloride flush  3 mL Intravenous Q12H  . traZODone  50 mg Oral QHS   Continuous Infusions: PRN Meds:.acetaminophen **OR** acetaminophen, albuterol, ondansetron **OR** ondansetron (ZOFRAN) IV   Assessment: Principal Problem:   GI bleeding Active Problems:   Type 2 diabetes mellitus without complication, without long-term current use of insulin (HCC)   COPD (chronic obstructive pulmonary disease) (HCC)   Essential hypertension  HLD (hyperlipidemia)   Acute cerebrovascular accident (CVA) (HCC)   Acute blood loss anemia   Decubitus ulcer of coccyx, stage 2 (HCC)   Lower GI bleed    Plan: Day 5 off Plavix would be on 12/31/2019  If patient is still continuing to have diarrhea, and at that time she will have completed her C. difficile therapy, it would be useful to undergo  colonoscopy both for evaluation of abnormality seen in the rectum on CT scan, and to assess for pseudomembranes  However, if patient unable to complete prep, a an unprepped or enema prep flexible sigmoidoscopy might be enough as it would allow for evaluation of the rectum and evaluation of pseudomembranes as well  Dr. Mia Creek will assess the patient over the weekend, and determine appropriateness for colonoscopy or flexible sigmoidoscopy for the following day   LOS: 2 days   Melodie Bouillon, MD 12/29/2019, 3:46 PM

## 2019-12-29 NOTE — Progress Notes (Signed)
Patient ID: Molly [name], female   DOB: 1945-12-22, 74 y.o.   MRN: [id number] [facility] PROGRESS NOTE  Molly [name]:[id number] DOB: [birth year] DOA: 12/27/2019 PCP: Ethelda Chick, MD  HPI/Subjective: Patient did not eat much liquids this morning.  Has been there for lunch and only ate a few bites.  No abdominal pain.  Did have some diarrhea.  Today supposed to be the last day of the vancomycin for C. difficile treatment.  Objective: Vitals:   12/29/19 0839 12/29/19 1236  BP: 138/70 129/70  Pulse: 94 92  Resp: 16 16  Temp: 98.5 F (36.9 C) 98.5 F (36.9 C)  SpO2: 95% 94%    Intake/Output Summary (Last 24 hours) at 12/29/2019 1343 Last data filed at 12/29/2019 0700 Gross per 24 hour  Intake 0 ml  Output 200 ml  Net -200 ml   Filed Weights   12/27/19 0913 12/28/19 1817 12/29/19 0511  Weight: 94.9 kg 91.2 kg 91.2 kg    ROS: Review of Systems  Respiratory: Negative for shortness of breath.   Cardiovascular: Negative for chest pain.  Gastrointestinal: Positive for diarrhea. Negative for abdominal pain, nausea and vomiting.   Exam: Physical Exam HENT:     Mouth/Throat:     Pharynx: No oropharyngeal exudate.  Eyes:     General: Lids are normal.     Conjunctiva/sclera: Conjunctivae normal.     Pupils: Pupils are equal, round, and reactive to light.  Cardiovascular:     Rate and Rhythm: Normal rate and regular rhythm.     Heart sounds: Normal heart sounds and S1 normal.  Pulmonary:     Breath sounds: No decreased breath sounds, wheezing, rhonchi or rales.  Abdominal:     Palpations: Abdomen is soft.     Tenderness: There is no abdominal tenderness.  Musculoskeletal:     Right ankle: No swelling.     Left ankle: No swelling.  Skin:    General: Skin is warm.     Findings: No rash.  Neurological:     Mental Status: She is alert.       Data Reviewed: Basic Metabolic Panel: Recent Labs  Lab 12/26/19 1143 12/27/19 1020 12/28/19 0439  12/29/19 0450  NA 134* 135 136 135  K 4.4 4.2 4.2 4.1  CL 93* 92* 98 96*  CO2 32 32 25 30  GLUCOSE 124* 144* 112* 109*  BUN 10 8 11 9   CREATININE 0.60 0.58 0.64 0.44  CALCIUM 8.5* 8.4* 8.1* 8.7*   Liver Function Tests: Recent Labs  Lab 12/26/19 1143 12/27/19 1020  AST 51* 40  ALT 43 35  ALKPHOS 53 47  BILITOT 0.9 1.0  PROT 6.9 6.4*  ALBUMIN 2.3* 2.1*   Recent Labs  Lab 12/26/19 1143  LIPASE 23   CBC: Recent Labs  Lab 12/26/19 1143 12/26/19 1143 12/27/19 1020 12/27/19 1020 12/28/19 0013 12/28/19 0013 12/28/19 0439 12/28/19 1251 12/28/19 1909 12/29/19 0450 12/29/19 1214  WBC 8.0  --  8.7  --   --   --  9.0  --   --   --   --   HGB 10.3*   < > 9.4*   < > 9.6*   < > 11.9* 10.3* 10.8* 10.6* 10.4*  HCT 31.8*  --  29.4*  --  30.4*  --  37.4  --   --   --   --   MCV 89.8  --  89.4  --   --   --  94.0  --   --   --   --  PLT 380  --  386  --   --   --  318  --   --   --   --    < > = values in this interval not displayed.   CBG: Recent Labs  Lab 12/28/19 2123 12/29/19 0010 12/29/19 0505 12/29/19 0840 12/29/19 1233  GLUCAP 96 144* 113* 108* 115*    Recent Results (from the past 240 hour(s))  MRSA PCR Screening     Status: Abnormal   Collection Time: 12/28/19  7:30 PM   Specimen: Nasal Mucosa; Nasopharyngeal  Result Value Ref Range Status   MRSA by PCR POSITIVE (A) NEGATIVE Final    Comment:        The GeneXpert MRSA Assay (FDA approved for NASAL specimens only), is one component of a comprehensive MRSA colonization surveillance program. It is not intended to diagnose MRSA infection nor to guide or monitor treatment for MRSA infections. CRITICAL RESULT CALLED TO, READ BACK BY AND VERIFIED WITH: DESIREE DONSON @ [phone] 12/29/19 RH Performed at [facility], [address] [place], [place] [address]   Respiratory Panel by RT PCR (Flu A&B, Covid) - Nasal Mucosa     Status: None   Collection Time: 12/28/19  8:00 PM   Specimen: Nasal Mucosa;  Nasopharyngeal  Result Value Ref Range Status   SARS Coronavirus 2 by RT PCR NEGATIVE NEGATIVE Final    Comment: (NOTE) SARS-CoV-2 target nucleic acids are NOT DETECTED.  The SARS-CoV-2 RNA is generally detectable in upper respiratoy specimens during the acute phase of infection. The lowest concentration of SARS-CoV-2 viral copies this assay can detect is 131 copies/mL. A negative result does not preclude SARS-Cov-2 infection and should not be used as the sole basis for treatment or other patient management decisions. A negative result may occur with  improper specimen collection/handling, submission of specimen other than nasopharyngeal swab, presence of viral mutation(s) within the areas targeted by this assay, and inadequate number of viral copies (<131 copies/mL). A negative result must be combined with clinical observations, patient history, and epidemiological information. The expected result is Negative.  Fact Sheet for Patients:  https://www.moore.com/https://www.fda.gov/media/142436/download  Fact Sheet for Healthcare Providers:  https://www.young.biz/https://www.fda.gov/media/142435/download  This test is no t yet approved or cleared by the [place] FDA and  has been authorized for detection and/or diagnosis of SARS-CoV-2 by FDA under an Emergency Use Authorization (EUA). This EUA will remain  in effect (meaning this test can be used) for the duration of the COVID-19 declaration under Section 564(b)(1) of the Act, 21 U.S.C. section 360bbb-3(b)(1), unless the authorization is terminated or revoked sooner.     Influenza A by PCR NEGATIVE NEGATIVE Final   Influenza B by PCR NEGATIVE NEGATIVE Final    Comment: (NOTE) The Xpert Xpress SARS-CoV-2/FLU/RSV assay is intended as an aid in  the diagnosis of influenza from Nasopharyngeal swab specimens and  should not be used as a sole basis for treatment. Nasal washings and  aspirates are unacceptable for Xpert Xpress SARS-CoV-2/FLU/RSV  testing.  Fact Sheet for  Patients: https://www.moore.com/https://www.fda.gov/media/142436/download  Fact Sheet for Healthcare Providers: https://www.young.biz/https://www.fda.gov/media/142435/download  This test is not yet approved or cleared by the [place] FDA and  has been authorized for detection and/or diagnosis of SARS-CoV-2 by  FDA under an Emergency Use Authorization (EUA). This EUA will remain  in effect (meaning this test can be used) for the duration of the  Covid-19 declaration under Section 564(b)(1) of the Act, 21  U.S.C. section 360bbb-3(b)(1), unless the  authorization is  terminated or revoked. Performed at [facility], [address], [place], [place] [address]      Studies: CT Angio Abd/Pel W and/or Wo Contrast  Result Date: 12/27/2019 CLINICAL DATA:  GI bleeding. EXAM: CTA ABDOMEN AND PELVIS WITHOUT AND WITH CONTRAST TECHNIQUE: Multidetector CT imaging of the abdomen and pelvis was performed using the standard protocol during bolus administration of intravenous contrast. Multiplanar reconstructed images and MIPs were obtained and reviewed to evaluate the vascular anatomy. CONTRAST:  OMNIPAQUE IOHEXOL 350 MG/ML SOLN COMPARISON:  CT abdomen pelvis 1 day prior FINDINGS: VASCULAR Aorta: Abdominal aorta normal caliber.  No dissection or aneurysm. Celiac: Celiac trunk is widely patent. SMA: SMA is widely patent.  No branch occlusion. Renals: Bilateral pain single renal arteries with ostial calcification. IMA: Widely patent Inflow: Normal Proximal Outflow: Normal Veins: Normal Review of the MIP images confirms the above findings. NON-VASCULAR Lower chest: Lung bases are Hepatobiliary: Normal liver.  Gallbladder mildly distended. Pancreas: Normal pancreatic parenchyma. Spleen: Spleen normal. Adrenals/Urinary Tract: Adrenal glands normal. Kidneys enhance symmetrically. No obstructive uropathy. Bladder contains excreted contrast from CT 1 day prior. Stomach/Bowel: Evaluation of the colon demonstrates no active arterial extravasation  to localize gastrointestinal bleeding. Again demonstrated inflammation along the serosal surface of the distal rectum unchanged from prior. Moderate volume stool throughout the entirety of the colon. The stomach, duodenum small-bowel normal. No pneumatosis or portal venous gas. Lymphatic: No lymphadenopathy Reproductive: Unremarkable Other: No intraperitoneal free air free fluid Musculoskeletal: No aggressive osseous lesion IMPRESSION: VASCULAR 1. No evidence of active gastrointestinal bleeding on CTA imaging. 2. Celiac trunk, SMA and IMA are widely patent. NON-VASCULAR 1. Inflammation along the serosal surface of the rectum not changed from day prior. Findings suggest proctitis. 2. No pneumatosis or evidence for ischemia. Electronically Signed   By: Genevive Bi M.D.   On: 12/27/2019 19:38    Scheduled Meds: . acidophilus  2 capsule Oral TID  . amitriptyline  75 mg Oral QHS  . Chlorhexidine Gluconate Cloth  6 each Topical Q0600  . insulin aspart  0-15 Units Subcutaneous Q4H  . magnesium oxide  400 mg Oral BID  . mupirocin ointment  1 application Nasal BID  . sodium chloride flush  3 mL Intravenous Q12H  . traZODone  50 mg Oral QHS    Assessment/Plan:  1. Acute blood loss anemia and lower GI bleed.  Patient received 2 units of packed red blood cells.  Hemoglobin stabilized in the tens.  Gastroenterology following.  Continue to hold aspirin and Plavix.  Last dose of Plavix was on 12/26/2019. 2. Recent C. difficile colitis.  Patient still having diarrhea.  Was supposed to finish vancomycin as of yesterday.  Will speak with pharmacist on whether to continue since having diarrhea.  We will hold magnesium supplementation for now. 3. Stage II coccyx decubiti present on admission see description below. 4. Weakness.  Physical therapy evaluation 5. History of stroke holding aspirin and Plavix 6. Essential hypertension.  Blood pressure little bit on the lower side.  Continue to monitor. 7. Type 2  diabetes mellitus.  Sliding scale insulin for now and holding diabetic oral medications  Pressure Injury 12/28/19 Coccyx Left Stage 2 -  Partial thickness loss of dermis presenting as a shallow open injury with a red, pink wound bed without slough. (Active)  12/28/19 1845  Location: Coccyx  Location Orientation: Left  Staging: Stage 2 -  Partial thickness loss of dermis presenting as a shallow open injury with a  red, pink wound bed without slough.  Wound Description (Comments):   Present on Admission: Yes       Code Status:     Code Status Orders  (From admission, onward)         Start     Ordered   12/27/19 2155  Full code  Continuous        12/27/19 2154        Code Status History    Date Active Date Inactive Code Status Order ID Comments User Context   12/12/2019 0246 12/18/2019 2104 Full Code [id number]  Hannah Beat, MD ED   04/27/2016 0857 04/30/2016 1830 Full Code [id number]  Milagros Loll, MD ED   03/05/2016 1549 03/12/2016 1723 Full Code [id number]  Eugenie Norrie, NP ED   02/06/2016 1532 02/11/2016 1925 Full Code [id number]  Hower, Cletis Athens, MD ED   02/01/2016 2117 02/05/2016 1359 Full Code [id number]  Auburn Bilberry, MD Inpatient   01/14/2016 1401 01/16/2016 2222 Full Code [id number]  Katha Hamming, MD ED   11/19/2014 2240 11/21/2014 2055 Full Code [id number]  Oralia Manis, MD Inpatient   05/22/2014 1831 05/25/2014 2020 Full Code [id number]  Toni Arthurs, MD Inpatient   05/21/2014 2231 05/22/2014 1831 Full Code [id number]  Yevonne Pax, MD Inpatient   Advance Care Planning Activity     Family Communication: Spoke with husband on the phone Disposition Plan: Status is: Inpatient  Dispo: The patient is from: Rehab              Anticipated d/c is to: Rehab              Anticipated d/c date is: May end up needing a colonoscopy on Monday prior to disposition              Patient currently being watched closely for GI bleeding.  Had some blood in bowel movement this  morning.  Time spent: 28 minutes  Rodarius K[employer]

## 2019-12-30 ENCOUNTER — Inpatient Hospital Stay: Payer: Medicare HMO

## 2019-12-30 DIAGNOSIS — R5383 Other fatigue: Secondary | ICD-10-CM

## 2019-12-30 LAB — BASIC METABOLIC PANEL
Anion gap: 10 (ref 5–15)
BUN: 7 mg/dL — ABNORMAL LOW (ref 8–23)
CO2: 30 mmol/L (ref 22–32)
Calcium: 9 mg/dL (ref 8.9–10.3)
Chloride: 95 mmol/L — ABNORMAL LOW (ref 98–111)
Creatinine, Ser: 0.61 mg/dL (ref 0.44–1.00)
GFR calc Af Amer: >60 mL/min (ref >60)
GFR calc non Af Amer: >60 mL/min (ref >60)
Glucose, Bld: 110 mg/dL — ABNORMAL HIGH (ref 70–99)
Potassium: 4 mmol/L (ref 3.5–5.1)
Sodium: 135 mmol/L (ref 135–145)

## 2019-12-30 LAB — GLUCOSE, CAPILLARY
Glucose-Capillary: 122 mg/dL — ABNORMAL HIGH (ref 70–99)
Glucose-Capillary: 123 mg/dL — ABNORMAL HIGH (ref 70–99)
Glucose-Capillary: 110 mg/dL — ABNORMAL HIGH (ref 70–99)
Glucose-Capillary: 108 mg/dL — ABNORMAL HIGH (ref 70–99)
Glucose-Capillary: 136 mg/dL — ABNORMAL HIGH (ref 70–99)

## 2019-12-30 LAB — CBC
HCT: 32.4 % — ABNORMAL LOW (ref 36.0–46.0)
Hemoglobin: 10.3 g/dL — ABNORMAL LOW (ref 12.0–15.0)
MCH: 29.3 pg (ref 26.0–34.0)
MCHC: 31.8 g/dL (ref 30.0–36.0)
MCV: 92.3 fL (ref 80.0–100.0)
Platelets: 312 10*3/uL (ref 150–400)
RBC: 3.51 MIL/uL — ABNORMAL LOW (ref 3.87–5.11)
RDW: 15.7 % — ABNORMAL HIGH (ref 11.5–15.5)
WBC: 6.5 10*3/uL (ref 4.0–10.5)
nRBC: 0 % (ref 0.0–0.2)

## 2019-12-30 NOTE — Progress Notes (Signed)
Pt is lethargic and very difficult to arouse at this time.  Will closely monitor. Pt's respirations are even and unlabored.

## 2019-12-30 NOTE — Progress Notes (Signed)
Patient ID: Molly [name], female   DOB: [birth year], 74 y.o.   MRN: [id number] [facility] PROGRESS NOTE  LEKHA DANCER KWI:[id number] DOB: 01/23/1946 DOA: 12/27/2019 PCP: Ethelda Chick, MD  HPI/Subjective: Patient seen this morning.  She kept her eyes closed the entire time.  Patient has chronic left arm paralysis.  Came in with lower GI bleed.  Objective: Vitals:   12/29/19 2323 12/30/19 0846  BP: (!) 111/57 140/78  Pulse: 96 92  Resp: 16 17  Temp: 98.3 F (36.8 C) 97.7 F (36.5 C)  SpO2: 100% 100%   No intake or output data in the 24 hours ending 12/30/19 1445 Filed Weights   12/27/19 0913 12/28/19 1817 12/29/19 0511  Weight: 94.9 kg 91.2 kg 91.2 kg    ROS: Review of Systems  Respiratory: Negative for shortness of breath.   Cardiovascular: Negative for chest pain.  Gastrointestinal: Negative for abdominal pain, nausea and vomiting.   Exam: Physical Exam HENT:     Mouth/Throat:     Pharynx: No oropharyngeal exudate.  Eyes:     General: Lids are normal.     Conjunctiva/sclera: Conjunctivae normal.     Comments: Patient resisted me opening her eyes.  Cardiovascular:     Rate and Rhythm: Normal rate and regular rhythm.     Heart sounds: Normal heart sounds, S1 normal and S2 normal.  Pulmonary:     Breath sounds: Examination of the right-lower field reveals decreased breath sounds. Examination of the left-lower field reveals decreased breath sounds. Decreased breath sounds present. No wheezing, rhonchi or rales.  Abdominal:     Palpations: Abdomen is soft.     Tenderness: There is no abdominal tenderness.  Musculoskeletal:     Right lower leg: No swelling.     Left lower leg: No swelling.  Skin:    General: Skin is warm.     Findings: No rash.  Neurological:     Mental Status: She is alert.     Comments: Left arm paralysis.       Data Reviewed: Basic Metabolic Panel: Recent Labs  Lab 12/26/19 1143 12/27/19 1020 12/28/19 0439  12/29/19 0450 12/30/19 0332  NA 134* 135 136 135 135  K 4.4 4.2 4.2 4.1 4.0  CL 93* 92* 98 96* 95*  CO2 32 32 25 30 30   GLUCOSE 124* 144* 112* 109* 110*  BUN 10 8 11 9  7*  CREATININE 0.60 0.58 0.64 0.44 0.61  CALCIUM 8.5* 8.4* 8.1* 8.7* 9.0   Liver Function Tests: Recent Labs  Lab 12/26/19 1143 12/27/19 1020  AST 51* 40  ALT 43 35  ALKPHOS 53 47  BILITOT 0.9 1.0  PROT 6.9 6.4*  ALBUMIN 2.3* 2.1*   Recent Labs  Lab 12/26/19 1143  LIPASE 23   CBC: Recent Labs  Lab 12/26/19 1143 12/26/19 1143 12/27/19 1020 12/27/19 1020 12/28/19 0013 12/28/19 0013 12/28/19 0439 12/28/19 0439 12/28/19 1251 12/28/19 1909 12/29/19 0450 12/29/19 1214 12/30/19 0332  WBC 8.0  --  8.7  --   --   --  9.0  --   --   --   --   --  6.5  HGB 10.3*   < > 9.4*   < > 9.6*   < > 11.9*   < > 10.3* 10.8* 10.6* 10.4* 10.3*  HCT 31.8*  --  29.4*  --  30.4*  --  37.4  --   --   --   --   --  32.4*  MCV 89.8  --  89.4  --   --   --  94.0  --   --   --   --   --  92.3  PLT 380  --  386  --   --   --  318  --   --   --   --   --  312   < > = values in this interval not displayed.    CBG: Recent Labs  Lab 12/29/19 2007 12/29/19 2325 12/30/19 0333 12/30/19 0845 12/30/19 1210  GLUCAP 103* 100* 122* 123* 110*    Recent Results (from the past 240 hour(s))  MRSA PCR Screening     Status: Abnormal   Collection Time: 12/28/19  7:30 PM   Specimen: Nasal Mucosa; Nasopharyngeal  Result Value Ref Range Status   MRSA by PCR POSITIVE (A) NEGATIVE Final    Comment:        The GeneXpert MRSA Assay (FDA approved for NASAL specimens only), is one component of a comprehensive MRSA colonization surveillance program. It is not intended to diagnose MRSA infection nor to guide or monitor treatment for MRSA infections. CRITICAL RESULT CALLED TO, READ BACK BY AND VERIFIED WITH: DESIREE DONSON @ [phone] 12/29/19 RH Performed at [facility], [address] [place], [place] [address]    Respiratory Panel by RT PCR (Flu A&B, Covid) - Nasal Mucosa     Status: None   Collection Time: 12/28/19  8:00 PM   Specimen: Nasal Mucosa; Nasopharyngeal  Result Value Ref Range Status   SARS Coronavirus 2 by RT PCR NEGATIVE NEGATIVE Final    Comment: (NOTE) SARS-CoV-2 target nucleic acids are NOT DETECTED.  The SARS-CoV-2 RNA is generally detectable in upper respiratoy specimens during the acute phase of infection. The lowest concentration of SARS-CoV-2 viral copies this assay can detect is 131 copies/mL. A negative result does not preclude SARS-Cov-2 infection and should not be used as the sole basis for treatment or other patient management decisions. A negative result may occur with  improper specimen collection/handling, submission of specimen other than nasopharyngeal swab, presence of viral mutation(s) within the areas targeted by this assay, and inadequate number of viral copies (<131 copies/mL). A negative result must be combined with clinical observations, patient history, and epidemiological information. The expected result is Negative.  Fact Sheet for Patients:  https://www.moore.com/https://www.fda.gov/media/142436/download  Fact Sheet for Healthcare Providers:  https://www.young.biz/https://www.fda.gov/media/142435/download  This test is no t yet approved or cleared by the [place] FDA and  has been authorized for detection and/or diagnosis of SARS-CoV-2 by FDA under an Emergency Use Authorization (EUA). This EUA will remain  in effect (meaning this test can be used) for the duration of the COVID-19 declaration under Section 564(b)(1) of the Act, 21 U.S.C. section 360bbb-3(b)(1), unless the authorization is terminated or revoked sooner.     Influenza A by PCR NEGATIVE NEGATIVE Final   Influenza B by PCR NEGATIVE NEGATIVE Final    Comment: (NOTE) The Xpert Xpress SARS-CoV-2/FLU/RSV assay is intended as an aid in  the diagnosis of influenza from Nasopharyngeal swab specimens and  should not be used  as a sole basis for treatment. Nasal washings and  aspirates are unacceptable for Xpert Xpress SARS-CoV-2/FLU/RSV  testing.  Fact Sheet for Patients: https://www.moore.com/https://www.fda.gov/media/142436/download  Fact Sheet for Healthcare Providers: https://www.young.biz/https://www.fda.gov/media/142435/download  This test is not yet approved or cleared by the [place] FDA and  has been authorized for detection and/or diagnosis of SARS-CoV-2 by  FDA under an  Emergency Use Authorization (EUA). This EUA will remain  in effect (meaning this test can be used) for the duration of the  Covid-19 declaration under Section 564(b)(1) of the Act, 21  U.S.C. section 360bbb-3(b)(1), unless the authorization is  terminated or revoked. Performed at [facility], [address] [place], [place] [address]       Scheduled Meds: . acidophilus  2 capsule Oral TID  . amitriptyline  75 mg Oral QHS  . Chlorhexidine Gluconate Cloth  6 each Topical Q0600  . insulin aspart  0-15 Units Subcutaneous Q4H  . mupirocin ointment  1 application Nasal BID  . sodium chloride flush  3 mL Intravenous Q12H  . traZODone  50 mg Oral QHS    Assessment/Plan:  1. Acute blood loss anemia with lower GI bleed.  Patient received 2 units of packed red blood cells.  Hemoglobin has stabilized.  Gastroenterology wants to hold off on procedure at this point.  Continue to hold aspirin and Plavix.  Last dose of Plavix was 12/26/2019.  Advance diet today. 2. And the lethargy with keeping eyes closed today.  We will get a CAT scan of the head. 3. Recent C. difficile colitis finished up vancomycin.  Hold magnesium supplementation also for right now which could also cause diarrhea. 4. Stage II coccyx decubiti present on admission.  See description below. 5. Weakness.  Appreciate physical therapy evaluation 6. History of stroke with left arm paralysis.  Holding aspirin and Plavix 7. Type 2 diabetes mellitus.  On sliding scale insulin for right now.  Pressure  Injury 12/28/19 Coccyx Left Stage 2 -  Partial thickness loss of dermis presenting as a shallow open injury with a red, pink wound bed without slough. (Active)  12/28/19 1845  Location: Coccyx  Location Orientation: Left  Staging: Stage 2 -  Partial thickness loss of dermis presenting as a shallow open injury with a red, pink wound bed without slough.  Wound Description (Comments):   Present on Admission: Yes     Code Status:     Code Status Orders  (From admission, onward)         Start     Ordered   12/27/19 2155  Full code  Continuous        12/27/19 2154        Code Status History    Date Active Date Inactive Code Status Order ID Comments User Context   12/12/2019 0246 12/18/2019 2104 Full Code [id number]  Hannah Beat, MD ED   04/27/2016 0857 04/30/2016 1830 Full Code [id number]  Milagros Loll, MD ED   03/05/2016 1549 03/12/2016 1723 Full Code [id number]  Eugenie Norrie, NP ED   02/06/2016 1532 02/11/2016 1925 Full Code [id number]  Hower, Cletis Athens, MD ED   02/01/2016 2117 02/05/2016 1359 Full Code [id number]  Auburn Bilberry, MD Inpatient   01/14/2016 1401 01/16/2016 2222 Full Code [id number]  Katha Hamming, MD ED   11/19/2014 2240 11/21/2014 2055 Full Code [id number]  Oralia Manis, MD Inpatient   05/22/2014 1831 05/25/2014 2020 Full Code [id number]  Toni Arthurs, MD Inpatient   05/21/2014 2231 05/22/2014 1831 Full Code [id number]  Yevonne Pax, MD Inpatient   Advance Care Planning Activity     Family Communication: Spoke with husband on the phone Disposition Plan: Status is: Inpatient  Dispo: The patient is from: [facility]              Anticipated d/c is to: [facility]  Anticipated d/c date is: 01/02/2020              Patient currently being watched closely for bleeding.  With her history of stroke I would like to get back on aspirin and or Plavix but hesitant with her bleeding.  Continue to monitor for diarrhea.  Get a CAT scan of the head.  Hopefully will  be able to do at least a flexible sigmoidoscopy prior to disposition.  Consultants:  Gastroenterology  Time spent: 27 minutes  [employer]

## 2019-12-30 NOTE — Progress Notes (Signed)
Pt remains lethargic and difficult to arouse by myself and also my nurse tech.  To notify MD at this time

## 2019-12-30 NOTE — Evaluation (Signed)
Physical Therapy Evaluation Patient Details Name: Molly [name] MRN: [id number] DOB: 25-May-1945 Today's Date: 12/30/2019   History of Present Illness  Molly [name] is a 74 y.o. female with Past medical history of CVA with residual left-sided weakness, seizures, L toe amputation, HTN, HLD, IDDM, COPD, anxiety, recent fall recent history of C. Difficile (still being treated), presented at [facility] ED from rehab due to lower GI bleeding. Last admission approximately 2 weeks ago. Patient was seen in the ED 12/28/2019 due to GI bleeding, CT scan was done which showed  stercoral Proctocolitis, Hb was 10.3 no significant drop so patient was discharged back to rehab.  12/27/19 again patient had significant lower GI bleeding, dark-colored stool so patient was sent back to the ED for work-up. Is still being treated for C. Diff. Admitted for GI bleed as principle problem. GI consult suggested colonoscopy in hospital or outpatient depending on Plavix.    Clinical Impression  Patient lethargic and unable to provide history or prior function, so this information was taken from previous documentation last updated at previous hospitalization ~ 2 weeks ago. Patient is currently from SNF but previously was able to ambulate with RW and required help from husband for ADLs, IADLs. Upon PT evaluation, patient is able to intermittently follow single commands and is unable to voluntarily control L UE and LE to contribute to mobility. Required max A - Total A for rolling, scooting, supine <> sit and required min A to maintain edge of bed sitting for 5 min. Required 2L/min O2 to maintain SpO2 WFL. Patient is currently unable to safely return home due to lack of functional mobility/independence and would benefit from skilled rehab prior to returning home. Patient would benefit from skilled physical therapy to address impairments and functional limitations (see PT Problem List below) to work towards stated goals and return to PLOF or  maximal functional independence.       Follow Up Recommendations SNF    Equipment Recommendations   (TBD in next venue of care)    Recommendations for Other Services       Precautions / Restrictions Precautions Precautions: Fall Restrictions Weight Bearing Restrictions: No Other Position/Activity Restrictions: Residual L sided weakness      Mobility  Bed Mobility Overal bed mobility: Needs Assistance Bed Mobility: Rolling;Supine to Sit;Sit to Supine Rolling: Total assist   Supine to sit: Max assist Sit to supine: Total assist   General bed mobility comments: Patient able to follow 1 step commands inconsistently, able to reach and hold with R hand. Able to assist with scooting up in bed in trendelenburg position pushing with R LE and pulling R UE after PT set up position.  Transfers                 General transfer comment: Deferred. Pt unsafe/unable  Ambulation/Gait             General Gait Details: Pt unsafe/ unable  Stairs            Wheelchair Mobility    Modified Rankin (Stroke Patients Only)       Balance Overall balance assessment: Needs assistance Sitting-balance support: Bilateral upper extremity supported;Feet supported Sitting balance-Leahy Scale: Poor Sitting balance - Comments: patient unable to balance sitting without assistance, leans L and anterior. Sat at edge of bed for ~ 5 min with min A Postural control: Left lateral lean (anterior lean)     Standing balance comment: Not tested.  Pertinent Vitals/Pain Faces Pain Scale: Hurts a little bit Pain Location: stomach Pain Descriptors / Indicators: Discomfort;Grimacing Pain Intervention(s): Limited activity within patient's tolerance;Monitored during session;Repositioned    Home Living Family/patient expects to be discharged to:: Skilled nursing facility               Home Equipment: Gilmer Mor - single point;Walker -  standard;Bedside commode;Hand held shower head;Walker - 2 wheels;Grab bars - toilet;Grab bars - tub/shower Additional Comments: per previous documentation, patient has the above. Patient unable to provide any info on updates in equipment available at home.    Prior Function Level of Independence: Needs assistance   Gait / Transfers Assistance Needed: curently from SNF according to documention. prior documentation reports that previously she ambulates in home with RW  ADL's / Homemaking Assistance Needed: currently from SNF according to documentation. prior documentation states that previously husband assists with bed mobility, ADLs, performs meals, cooking, cleaning  Comments: Pt unable to provide information regarding PLOF. No family/caregiver present, per chart, pt was ambulating in the home with a RW ~2 months prior to initial admission, however some history also noted of consistent WC use at baseline. Per chart, husband assists with ADL management, bed mobility, etc.     Hand Dominance   Dominant Hand: Right    Extremity/Trunk Assessment   Upper Extremity Assessment Upper Extremity Assessment: Generalized weakness LUE Deficits / Details: LUE notably spastic with digits kept in flexion and minimal AROM of hand, wrist, and elbow. grossly 0-1/5 MMT LUE Coordination: decreased gross motor;decreased fine motor    Lower Extremity Assessment Lower Extremity Assessment: Generalized weakness LLE Deficits / Details: Grossly 1-2/5 MMT of L LE. LLE Coordination: decreased gross motor;decreased fine motor    Cervical / Trunk Assessment Cervical / Trunk Assessment: Kyphotic  Communication   Communication: Expressive difficulties;Receptive difficulties;Other (comment) (very drowsy, attempts to follow some single step commands)  Cognition Arousal/Alertness: Lethargic Behavior During Therapy: Flat affect Overall Cognitive Status: No family/caregiver present to determine baseline cognitive  functioning                                 General Comments: previous documentation says husband reports she has baseline cognitive deficits. Patient is very lethargic currently. Only able to state name and month/day of birth but not year and does not answer most other questions.      General Comments General comments (skin integrity, edema, etc.): Patient on 2L/min O2. Vitals within [facility] and start and end of session. SpO2 did drop to 89% just after return to bed.    Exercises Other Exercises Other Exercises: practiced seated balance x 5 min, educated on hand placement and techniques for safe mobility   Assessment/Plan    PT Assessment Patient needs continued PT services  PT Problem List Decreased strength;Decreased safety awareness;Decreased mobility;Decreased knowledge of precautions;Decreased cognition;Decreased activity tolerance;Decreased balance;Decreased knowledge of use of DME;Decreased range of motion;Decreased coordination;Pain;Cardiopulmonary status limiting activity;Impaired tone;Obesity;Decreased skin integrity       PT Treatment Interventions DME instruction;Therapeutic exercise;Balance training;Gait training;Functional mobility training;Therapeutic activities;Patient/family education;Neuromuscular re-education;Wheelchair mobility training    PT Goals (Current goals can be found in the Care Plan section)  Acute Rehab PT Goals PT Goal Formulation: Patient unable to participate in goal setting    Frequency Min 2X/week   Barriers to discharge        Co-evaluation               AM-PAC  PT "6 Clicks" Mobility  Outcome Measure Help needed turning from your back to your side while in a flat bed without using bedrails?: A Lot Help needed moving from lying on your back to sitting on the side of a flat bed without using bedrails?: A Lot Help needed moving to and from a bed to a chair (including a wheelchair)?: Total Help needed standing up from a chair  using your arms (e.g., wheelchair or bedside chair)?: Total Help needed to walk in hospital room?: Total Help needed climbing 3-5 steps with a railing? : Total 6 Click Score: 8    End of Session Equipment Utilized During Treatment: Oxygen (2L/min) Activity Tolerance: Treatment limited secondary to medical complications (Comment) (pt demonstrated limited ability to understand and act on commands. Pt also developed a fever and became less responsive) Patient left: in bed;with call bell/phone within reach;with bed alarm set Nurse Communication: Mobility status PT Visit Diagnosis: Unsteadiness on feet (R26.81);Other abnormalities of gait and mobility (R26.89);Muscle weakness (generalized) (M62.81);Difficulty in walking, not elsewhere classified (R26.2)    Time: 6010-9323 PT Time Calculation (min) (ACUTE ONLY): 23 min   Charges:   PT Evaluation $PT Eval Low Complexity: 1 Low PT Treatments $Therapeutic Activity: 8-22 mins        Luretha Murphy. Ilsa Iha, PT, DPT 12/30/19, 9:41 AM

## 2019-12-30 NOTE — Progress Notes (Signed)
GI Inpatient Follow-up Note  Subjective:  Patient seen and was difficult to arouse. Unable to assess ROS.  Scheduled Inpatient Medications:  . acidophilus  2 capsule Oral TID  . amitriptyline  75 mg Oral QHS  . Chlorhexidine Gluconate Cloth  6 each Topical Q0600  . insulin aspart  0-15 Units Subcutaneous Q4H  . mupirocin ointment  1 application Nasal BID  . sodium chloride flush  3 mL Intravenous Q12H  . traZODone  50 mg Oral QHS    Continuous Inpatient Infusions:    PRN Inpatient Medications:  acetaminophen **OR** acetaminophen, albuterol, ondansetron **OR** ondansetron (ZOFRAN) IV  Review of Systems:  Unable to assess   Physical Examination: BP 140/78 (BP Location: Left Arm)   Pulse 92   Temp 97.7 F (36.5 C) (Oral)   Resp 17   Ht 5\' 2"  (1.575 m)   Wt 91.2 kg   SpO2 100%   BMI 36.76 kg/m  Gen: Lethargic HEENT: PEERLA Neck: supple, no JVD Chest: No respiratory distress Abd: soft, NT, ND Ext: lower extremity edema Lymph: no lympahdenopathy  Data: Lab Results  Component Value Date   WBC 6.5 12/30/2019   HGB 10.3 (L) 12/30/2019   HCT 32.4 (L) 12/30/2019   MCV 92.3 12/30/2019   PLT 312 12/30/2019   Recent Labs  Lab 12/29/19 0450 12/29/19 1214 12/30/19 0332  HGB 10.6* 10.4* 10.3*   Lab Results  Component Value Date   NA 135 12/30/2019   K 4.0 12/30/2019   CL 95 (L) 12/30/2019   CO2 30 12/30/2019   BUN 7 (L) 12/30/2019   CREATININE 0.61 12/30/2019   Lab Results  Component Value Date   ALT 35 12/27/2019   AST 40 12/27/2019   ALKPHOS 47 12/27/2019   BILITOT 1.0 12/27/2019   Recent Labs  Lab 12/28/19 0439  APTT 32  INR 1.1   Assessment/Plan: Molly [name] is a 74 y.o. female who presented with concern for GI bleeding likely due to stercoral colitis but hemoglobin is stable  Recommendations:  - continue to monitor hemoglobins - given clinical status and stable hemoglobin, will not plan any invasive procedures at this time  Will continue  to follow, please call with any questions or concerns    65 MD, MPH [facility]

## 2019-12-31 DIAGNOSIS — R531 Weakness: Secondary | ICD-10-CM

## 2019-12-31 DIAGNOSIS — K625 Hemorrhage of anus and rectum: Secondary | ICD-10-CM

## 2019-12-31 DIAGNOSIS — G9341 Metabolic encephalopathy: Secondary | ICD-10-CM

## 2019-12-31 LAB — GLUCOSE, CAPILLARY
Glucose-Capillary: 119 mg/dL — ABNORMAL HIGH (ref 70–99)
Glucose-Capillary: 119 mg/dL — ABNORMAL HIGH (ref 70–99)
Glucose-Capillary: 127 mg/dL — ABNORMAL HIGH (ref 70–99)
Glucose-Capillary: 138 mg/dL — ABNORMAL HIGH (ref 70–99)
Glucose-Capillary: 157 mg/dL — ABNORMAL HIGH (ref 70–99)
Glucose-Capillary: 166 mg/dL — ABNORMAL HIGH (ref 70–99)

## 2019-12-31 LAB — HEMOGLOBIN: Hemoglobin: 10.8 g/dL — ABNORMAL LOW (ref 12.0–15.0)

## 2019-12-31 NOTE — Progress Notes (Signed)
GI Inpatient Follow-up Note  Subjective:  Patient more alert today than yesterday. A little confused. Denies any symptoms of GI bleeding or diarrhea.  Scheduled Inpatient Medications:  . acidophilus  2 capsule Oral TID  . amitriptyline  75 mg Oral QHS  . Chlorhexidine Gluconate Cloth  6 each Topical Q0600  . insulin aspart  0-15 Units Subcutaneous Q4H  . mupirocin ointment  1 application Nasal BID  . sodium chloride flush  3 mL Intravenous Q12H  . traZODone  50 mg Oral QHS    Continuous Inpatient Infusions:    PRN Inpatient Medications:  acetaminophen **OR** acetaminophen, albuterol, ondansetron **OR** ondansetron (ZOFRAN) IV  Review of Systems:  Review of Systems  Constitutional: Negative for fever.  Respiratory: Negative for shortness of breath.   Cardiovascular: Negative for chest pain.  Gastrointestinal: Negative for abdominal pain, blood in stool, constipation, diarrhea, melena and vomiting.  Musculoskeletal: Positive for joint pain.  Neurological: Positive for focal weakness.  Psychiatric/Behavioral: Negative for substance abuse.  All other systems reviewed and are negative.     Physical Examination: BP 134/78 (BP Location: Right Arm)   Pulse 99   Temp 97.8 F (36.6 C) (Oral)   Resp 15   Ht 5\' 2"  (1.575 m)   Wt 91.2 kg   SpO2 98%   BMI 36.76 kg/m  Gen: NAD, appeared slightly confused HEENT: PEERLA, EOMI, Neck: supple, no JVD or thyromegaly Chest: No respiratory distress Abd: soft, non-tender, non-distended Ext: no edema, well perfused with 2+ pulses, Skin: no rash or lesions noted Lymph: no LAD  Data: Lab Results  Component Value Date   WBC 6.5 12/30/2019   HGB 10.8 (L) 12/31/2019   HCT 32.4 (L) 12/30/2019   MCV 92.3 12/30/2019   PLT 312 12/30/2019   Recent Labs  Lab 12/29/19 1214 12/30/19 0332 12/31/19 0750  HGB 10.4* 10.3* 10.8*   Lab Results  Component Value Date   NA 135 12/30/2019   K 4.0 12/30/2019   CL 95 (L) 12/30/2019   CO2  30 12/30/2019   BUN 7 (L) 12/30/2019   CREATININE 0.61 12/30/2019   Lab Results  Component Value Date   ALT 35 12/27/2019   AST 40 12/27/2019   ALKPHOS 47 12/27/2019   BILITOT 1.0 12/27/2019   Recent Labs  Lab 12/28/19 0439  APTT 32  INR 1.1   Assessment/Plan: Ms. Schiraldi is a 74 y.o. female with findings of stercoral colitis on imaging and some rectal bleeding which has resolved. Still with some loose stools but likely due to recovering from c. Diff. Hemoglobin stable. Currently no indication for any invasive procedures  Recommendations:  - will continue conservative management and not plan on any invasive procedures - can advance diet as tolerated - will need to make sure she avoids constipation to prevent further episodes of stercoral colitis - [facility] will resume care tomorrow  Please call with questions or concerns.  65 MD, MPH [facility]

## 2019-12-31 NOTE — Progress Notes (Signed)
Patient ID: Molly [name], female   DOB: [birth year], 74 y.o.   MRN: [id number] [facility] PROGRESS NOTE  Molly [name] TIW:[id number] DOB: [birth year] DOA: 12/27/2019 PCP: Ethelda Chick, MD  HPI/Subjective: Patient was up and awake and eating breakfast this morning.  She was feeling better.  No abdominal pain.  No nausea or vomiting.  No further diarrhea or bleeding.  Objective: Vitals:   12/31/19 0030 12/31/19 0731  BP: 125/88 134/78  Pulse: 89 99  Resp: 20 15  Temp: 98 F (36.7 C) 97.8 F (36.6 C)  SpO2: 95% 98%    Intake/Output Summary (Last 24 hours) at 12/31/2019 1421 Last data filed at 12/31/2019 0620 Gross per 24 hour  Intake --  Output 325 ml  Net -325 ml   Filed Weights   12/27/19 0913 12/28/19 1817 12/29/19 0511  Weight: 94.9 kg 91.2 kg 91.2 kg    ROS: Review of Systems  Respiratory: Negative for shortness of breath.   Cardiovascular: Negative for chest pain.  Gastrointestinal: Negative for abdominal pain, nausea and vomiting.   Exam: Physical Exam HENT:     Nose: No mucosal edema.     Mouth/Throat:     Pharynx: No oropharyngeal exudate.  Eyes:     General: Lids are normal.     Conjunctiva/sclera: Conjunctivae normal.     Pupils: Pupils are equal, round, and reactive to light.  Cardiovascular:     Rate and Rhythm: Normal rate and regular rhythm.     Heart sounds: Normal heart sounds, S1 normal and S2 normal.  Pulmonary:     Breath sounds: No decreased breath sounds, wheezing, rhonchi or rales.  Abdominal:     Palpations: Abdomen is soft.     Tenderness: There is no abdominal tenderness.  Musculoskeletal:     Right lower leg: No swelling.     Left lower leg: No swelling.  Skin:    General: Skin is warm.     Findings: No rash.  Neurological:     Mental Status: She is alert and oriented to person, place, and time.     Comments: Left arm paralysis       Data Reviewed: Basic Metabolic Panel: Recent Labs  Lab 12/26/19 1143  12/27/19 1020 12/28/19 0439 12/29/19 0450 12/30/19 0332  NA 134* 135 136 135 135  K 4.4 4.2 4.2 4.1 4.0  CL 93* 92* 98 96* 95*  CO2 32 32 25 30 30   GLUCOSE 124* 144* 112* 109* 110*  BUN 10 8 11 9  7*  CREATININE 0.60 0.58 0.64 0.44 0.61  CALCIUM 8.5* 8.4* 8.1* 8.7* 9.0   Liver Function Tests: Recent Labs  Lab 12/26/19 1143 12/27/19 1020  AST 51* 40  ALT 43 35  ALKPHOS 53 47  BILITOT 0.9 1.0  PROT 6.9 6.4*  ALBUMIN 2.3* 2.1*   Recent Labs  Lab 12/26/19 1143  LIPASE 23   CBC: Recent Labs  Lab 12/26/19 1143 12/26/19 1143 12/27/19 1020 12/27/19 1020 12/28/19 0013 12/28/19 0013 12/28/19 0439 12/28/19 1251 12/28/19 1909 12/29/19 0450 12/29/19 1214 12/30/19 0332 12/31/19 0750  WBC 8.0  --  8.7  --   --   --  9.0  --   --   --   --  6.5  --   HGB 10.3*   < > 9.4*   < > 9.6*   < > 11.9*   < > 10.8* 10.6* 10.4* 10.3* 10.8*  HCT 31.8*  --  29.4*  --  30.4*  --  37.4  --   --   --   --  32.4*  --   MCV 89.8  --  89.4  --   --   --  94.0  --   --   --   --  92.3  --   PLT 380  --  386  --   --   --  318  --   --   --   --  312  --    < > = values in this interval not displayed.    CBG: Recent Labs  Lab 12/30/19 1932 12/31/19 0021 12/31/19 0410 12/31/19 0733 12/31/19 1212  GLUCAP 136* 119* 119* 127* 166*    Recent Results (from the past 240 hour(s))  MRSA PCR Screening     Status: Abnormal   Collection Time: 12/28/19  7:30 PM   Specimen: Nasal Mucosa; Nasopharyngeal  Result Value Ref Range Status   MRSA by PCR POSITIVE (A) NEGATIVE Final    Comment:        The GeneXpert MRSA Assay (FDA approved for NASAL specimens only), is one component of a comprehensive MRSA colonization surveillance program. It is not intended to diagnose MRSA infection nor to guide or monitor treatment for MRSA infections. CRITICAL RESULT CALLED TO, READ BACK BY AND VERIFIED WITH: DESIREE DONSON @ [phone] 12/29/19 RH Performed at [facility], [address],  [place], [place] [address]   Respiratory Panel by RT PCR (Flu A&B, Covid) - Nasal Mucosa     Status: None   Collection Time: 12/28/19  8:00 PM   Specimen: Nasal Mucosa; Nasopharyngeal  Result Value Ref Range Status   SARS Coronavirus 2 by RT PCR NEGATIVE NEGATIVE Final    Comment: (NOTE) SARS-CoV-2 target nucleic acids are NOT DETECTED.  The SARS-CoV-2 RNA is generally detectable in upper respiratoy specimens during the acute phase of infection. The lowest concentration of SARS-CoV-2 viral copies this assay can detect is 131 copies/mL. A negative result does not preclude SARS-Cov-2 infection and should not be used as the sole basis for treatment or other patient management decisions. A negative result may occur with  improper specimen collection/handling, submission of specimen other than nasopharyngeal swab, presence of viral mutation(s) within the areas targeted by this assay, and inadequate number of viral copies (<131 copies/mL). A negative result must be combined with clinical observations, patient history, and epidemiological information. The expected result is Negative.  Fact Sheet for Patients:  https://www.moore.com/  Fact Sheet for Healthcare Providers:  https://www.young.biz/  This test is no t yet approved or cleared by the [place] FDA and  has been authorized for detection and/or diagnosis of SARS-CoV-2 by FDA under an Emergency Use Authorization (EUA). This EUA will remain  in effect (meaning this test can be used) for the duration of the COVID-19 declaration under Section 564(b)(1) of the Act, 21 U.S.C. section 360bbb-3(b)(1), unless the authorization is terminated or revoked sooner.     Influenza A by PCR NEGATIVE NEGATIVE Final   Influenza B by PCR NEGATIVE NEGATIVE Final    Comment: (NOTE) The Xpert Xpress SARS-CoV-2/FLU/RSV assay is intended as an aid in  the diagnosis of influenza from Nasopharyngeal swab specimens  and  should not be used as a sole basis for treatment. Nasal washings and  aspirates are unacceptable for Xpert Xpress SARS-CoV-2/FLU/RSV  testing.  Fact Sheet for Patients: https://www.moore.com/  Fact Sheet for Healthcare Providers: https://www.young.biz/  This test is not yet approved or cleared by the [place] FDA and  has been authorized for detection and/or diagnosis of SARS-CoV-2 by  FDA under an Emergency Use Authorization (EUA). This EUA will remain  in effect (meaning this test can be used) for the duration of the  Covid-19 declaration under Section 564(b)(1) of the Act, 21  U.S.C. section 360bbb-3(b)(1), unless the authorization is  terminated or revoked. Performed at [facility], [address] [place], [place] [address]      Studies: CT HEAD WO CONTRAST  Result Date: 12/30/2019 CLINICAL DATA:  Altered mental status EXAM: CT HEAD WITHOUT CONTRAST TECHNIQUE: Contiguous axial images were obtained from the base of the skull through the vertex without intravenous contrast. COMPARISON:  09/21/2019 FINDINGS: Brain: No evidence of acute infarction, hemorrhage, hydrocephalus, extra-axial collection or mass lesion/mass effect. Insufflation changes related to old left frontal infarct. Old right thalamic infarct/wallerian degeneration. Suspected old left cerebellar infarct. Subcortical white matter and periventricular small vessel ischemic changes. Vascular: Intracranial atherosclerosis. Skull: Normal. Negative for fracture or focal lesion. Sinuses/Orbits: The visualized paranasal sinuses are essentially clear. The mastoid air cells are unopacified. Other: None. IMPRESSION: No evidence of acute intracranial abnormality. Old infarcts, as above. Electronically Signed   By: Charline BillsSriyesh  Krishnan M.D.   On: 12/30/2019 15:40    Scheduled Meds: . acidophilus  2 capsule Oral TID  . amitriptyline  75 mg Oral QHS  . Chlorhexidine Gluconate  Cloth  6 each Topical Q0600  . insulin aspart  0-15 Units Subcutaneous Q4H  . mupirocin ointment  1 application Nasal BID  . sodium chloride flush  3 mL Intravenous Q12H  . traZODone  50 mg Oral QHS   Continuous Infusions:  Assessment/Plan:  1. Acute metabolic encephalopathy.  This has improved today mental status much better.  CT scan of the head yesterday negative for acute stroke. 2. Acute blood loss anemia with lower GI bleed.  Patient did receive 2 units of packed red blood cells.  Hemoglobin has now stabilized.  Case discussed with gastroenterology and gastroenterology this afternoon decided they wanted to do conservative management without any further procedure.  Okay to advance diet. 3. Recent C. difficile colitis.  Finished up vancomycin here.  Hold oral magnesium. 4. Stage II coccyx decubiti present on admission.  See description below. 5. Weakness.  Physical therapy recommends rehab 6. Prior history of stroke with left arm paralysis.  Holding aspirin and Plavix for now. 7. Type 2 diabetes mellitus.  On sliding scale insulin currently.  Last hemoglobin A1c 7.3.  Can likely go back on Januvia as outpatient.  Pressure Injury 12/28/19 Coccyx Left Stage 2 -  Partial thickness loss of dermis presenting as a shallow open injury with a red, pink wound bed without slough. (Active)  12/28/19 1845  Location: Coccyx  Location Orientation: Left  Staging: Stage 2 -  Partial thickness loss of dermis presenting as a shallow open injury with a red, pink wound bed without slough.  Wound Description (Comments):   Present on Admission: Yes       Code Status:     Code Status Orders  (From admission, onward)         Start     Ordered   12/27/19 2155  Full code  Continuous        12/27/19 2154        Code Status History    Date Active Date Inactive Code Status Order ID Comments User Context   12/12/2019 0246 12/18/2019 2104 Full Code [id number]  Mansy, Vernetta HoneyJan A, MD  ED   04/27/2016 0857  04/30/2016 1830 Full Code [id number]  Milagros Loll, MD ED   03/05/2016 1549 03/12/2016 1723 Full Code [id number]  Eugenie Norrie, NP ED   02/06/2016 1532 02/11/2016 1925 Full Code [id number]  Wyatt Haste, MD ED   02/01/2016 2117 02/05/2016 1359 Full Code [id number]  Auburn Bilberry, MD Inpatient   01/14/2016 1401 01/16/2016 2222 Full Code [id number]  Katha Hamming, MD ED   11/19/2014 2240 11/21/2014 2055 Full Code [id number]  Oralia Manis, MD Inpatient   05/22/2014 1831 05/25/2014 2020 Full Code [id number]  Toni Arthurs, MD Inpatient   05/21/2014 2231 05/22/2014 1831 Full Code [id number]  Yevonne Pax, MD Inpatient   Advance Care Planning Activity     Family Communication: Spoke with husband on the phone this morning Disposition Plan: Status is: Inpatient  Dispo: The patient is from: Rehab              Anticipated d/c is to: Rehab              Anticipated d/c date is: 01/01/2020              Patient currently more alert today.  Will advance diet.  Gastroenterology this afternoon decided not to do any procedures while here.  Time spent: 28 minutes.  Case discussed with gastroenterology.  Alechia Lezama [employer]  [facility] [employer]

## 2020-01-01 DIAGNOSIS — R338 Other retention of urine: Secondary | ICD-10-CM

## 2020-01-01 DIAGNOSIS — E1169 Type 2 diabetes mellitus with other specified complication: Secondary | ICD-10-CM

## 2020-01-01 DIAGNOSIS — E785 Hyperlipidemia, unspecified: Secondary | ICD-10-CM

## 2020-01-01 LAB — GLUCOSE, CAPILLARY
Glucose-Capillary: 126 mg/dL — ABNORMAL HIGH (ref 70–99)
Glucose-Capillary: 128 mg/dL — ABNORMAL HIGH (ref 70–99)
Glucose-Capillary: 147 mg/dL — ABNORMAL HIGH (ref 70–99)
Glucose-Capillary: 149 mg/dL — ABNORMAL HIGH (ref 70–99)
Glucose-Capillary: 150 mg/dL — ABNORMAL HIGH (ref 70–99)
Glucose-Capillary: 163 mg/dL — ABNORMAL HIGH (ref 70–99)

## 2020-01-01 LAB — BLOOD GAS, ARTERIAL
Acid-Base Excess: 7.7 mmol/L — ABNORMAL HIGH (ref 0.0–2.0)
Bicarbonate: 32 mmol/L — ABNORMAL HIGH (ref 20.0–28.0)
FIO2: 0.21
O2 Saturation: 95.7 %
Patient temperature: 37
pCO2 arterial: 43 mmHg (ref 32.0–48.0)
pH, Arterial: 7.48 — ABNORMAL HIGH (ref 7.350–7.450)
pO2, Arterial: 74 mmHg — ABNORMAL LOW (ref 83.0–108.0)

## 2020-01-01 LAB — BASIC METABOLIC PANEL
Anion gap: 11 (ref 5–15)
BUN: 9 mg/dL (ref 8–23)
CO2: 29 mmol/L (ref 22–32)
Calcium: 8.5 mg/dL — ABNORMAL LOW (ref 8.9–10.3)
Chloride: 95 mmol/L — ABNORMAL LOW (ref 98–111)
Creatinine, Ser: 0.68 mg/dL (ref 0.44–1.00)
GFR calc Af Amer: >60 mL/min (ref >60)
GFR calc non Af Amer: >60 mL/min (ref >60)
Glucose, Bld: 141 mg/dL — ABNORMAL HIGH (ref 70–99)
Potassium: 4 mmol/L (ref 3.5–5.1)
Sodium: 135 mmol/L (ref 135–145)

## 2020-01-01 LAB — RESPIRATORY PANEL BY RT PCR (FLU A&B, COVID)
Influenza A by PCR: NEGATIVE
Influenza B by PCR: NEGATIVE
SARS Coronavirus 2 by RT PCR: NEGATIVE

## 2020-01-01 LAB — HEMOGLOBIN: Hemoglobin: 11.3 g/dL — ABNORMAL LOW (ref 12.0–15.0)

## 2020-01-01 LAB — MAGNESIUM: Magnesium: 2.3 mg/dL (ref 1.7–2.4)

## 2020-01-01 MED ORDER — VANCOMYCIN 50 MG/ML ORAL SOLUTION: 125.0000 mg | Freq: Two times a day (BID) | ORAL | Status: DC

## 2020-01-01 MED ORDER — VANCOMYCIN 50 MG/ML ORAL SOLUTION: 125.0000 mg | ORAL | Status: DC

## 2020-01-01 MED ORDER — SODIUM CHLORIDE 0.9 % IV BOLUS
250.0000 mL | Freq: Once | INTRAVENOUS | Status: AC
  Administered 2020-01-01: 250 mL via INTRAVENOUS

## 2020-01-01 MED ORDER — VANCOMYCIN 50 MG/ML ORAL SOLUTION
125.0000 mg | Freq: Four times a day (QID) | ORAL | Status: DC
  Administered 2020-01-01 – 2020-01-04 (×11): 125 mg via ORAL
  Filled 2020-01-01 (×14): qty 2.5

## 2020-01-01 MED ORDER — VANCOMYCIN 50 MG/ML ORAL SOLUTION: 125.0000 mg | Freq: Every day | ORAL | Status: DC

## 2020-01-01 MED ORDER — MUPIROCIN 2 % EX OINT: 1.0000 "application " | TOPICAL_OINTMENT | Freq: Two times a day (BID) | CUTANEOUS | 0 refills | Status: DC

## 2020-01-01 NOTE — Care Management Important Message (Signed)
Important Message  Patient Details  Name: Molly [name] MRN: [id number] Date of Birth: 05-30-1945   Medicare Important Message Given:  Other (see comment)  Attempted to review Medicare IM with patient via room phone due to isolation status, however no answer.     Johnell Comings 01/01/2020, 3:14 PM

## 2020-01-01 NOTE — Plan of Care (Signed)

## 2020-01-01 NOTE — Discharge Summary (Addendum)
[facility] - [place] at [facility]   PATIENT NAME: Molly [name]    MR#:  [id number]  DATE OF BIRTH:  1945/05/13  DATE OF ADMISSION:  12/27/2019 ADMITTING PHYSICIAN: Gillis Santa, MD  DATE OF DISCHARGE: 01/02/2020  PRIMARY CARE PHYSICIAN: Ethelda Chick, MD    ADMISSION DIAGNOSIS:  GI bleeding [K92.2] Rectal bleeding [K62.5] GI bleed [K92.2]  DISCHARGE DIAGNOSIS:  Principal Problem:   GI bleeding Active Problems:   Type 2 diabetes mellitus with hyperlipidemia (HCC)   COPD (chronic obstructive pulmonary disease) (HCC)   Essential hypertension   HLD (hyperlipidemia)   Acute cerebrovascular accident (CVA) (HCC)   Acute blood loss anemia   Decubitus ulcer of coccyx, stage 2 (HCC)   Lower GI bleed   Lethargy   Acute metabolic encephalopathy   Rectal bleeding   Weakness   Acute urinary retention   SECONDARY DIAGNOSIS:   Past Medical History:  Diagnosis Date  . Anxiety   . COPD (chronic obstructive pulmonary disease) (HCC)   . Diabetes (HCC)   . Hemorrhoid   . HLD (hyperlipidemia)   . HTN (hypertension)   . Stroke [facility]) 2008   left weakness    HOSPITAL COURSE:   1.  Acute blood loss anemia with lower GI bleed.  The patient did receive 2 units of packed red blood cells during the hospital course.  Hemoglobin now stabilized.  Last hemoglobin 12.2.  Gastroenterology over the weekend did not want to do any further procedures and advance diet.  I will hold off on aspirin and Plavix at this point.  Follow-up with gastroenterology in few weeks. 2.  C. difficile colitis.  Initially finished up treatment but then developed diarrhea here again in the hospital.  I restarted vancomycin orally and this will be given a prolonged course and taper to ensure treatment success.  Please go by instructions on prescription and discharge instructions. 3.  Acute metabolic encephalopathy.  Patient was lethargic during the beginning of the hospital stay.  Patient up and  awake today and feeling good.  Offers no complaints.  ABG did not show any CO2 retention.  Overnight oximetry she maintain her sats except for 1 episode where there was a borderline saturation.  That would not qualify for nocturnal oxygen. 4.  Stage II coccyx decubitus present on admission.  Partial-thickness, shallow open injury with red-pink wound bed without slough.  Left coccyx. 5.  Weakness.  Physical therapy recommends rehab 6.  History of stroke with left arm paralysis.  Continue to hold Plavix and aspirin with bleeding. 7.  Type 2 diabetes mellitus with hyperlipidemia.  Can go back on Januvia and atorvastatin as outpatient.  Last hemoglobin A1c 7.3. 8.  Urinary retention.  Bladder scan and in and out catheterizations every 8 hours if she does not urinate or if bladder scan shows more than 500 mL. 9.  Vaginal bleeding.  Gynecology consultation appreciated.  DISCHARGE CONDITIONS:   Satisfactory  CONSULTS OBTAINED:  Treatment Team:  Pasty Spillers, MD  DRUG ALLERGIES:  No Known Allergies  DISCHARGE MEDICATIONS:   Allergies as of 01/02/2020   No Known Allergies     Medication List    STOP taking these medications   clopidogrel 75 MG tablet Commonly known as: PLAVIX   docusate sodium 100 MG capsule Commonly known as: Colace   insulin aspart 100 UNIT/ML injection Commonly known as: novoLOG   magnesium oxide 400 MG tablet Commonly known as: MAG-OX   polyethylene glycol 17 g packet Commonly known  as: MIRALAX / GLYCOLAX   senna-docusate 8.6-50 MG tablet Commonly known as: Senokot-S     TAKE these medications   acetaminophen 325 MG tablet Commonly known as: TYLENOL Take 650 mg by mouth every 6 (six) hours as needed.   acidophilus Caps capsule Take 2 capsules by mouth 3 (three) times daily for 15 days.   albuterol 108 (90 Base) MCG/ACT inhaler Commonly known as: VENTOLIN HFA ALBUTEROL 90 MCG/ACT AERS   amitriptyline 75 MG tablet Commonly known as:  ELAVIL Take 75 mg by mouth at bedtime.   aspirin EC 81 MG tablet Take 81 mg by mouth daily.   atorvastatin 20 MG tablet Commonly known as: LIPITOR Take 20 mg by mouth daily at 6 PM.   diclofenac sodium 1 % Gel Commonly known as: VOLTAREN Apply 2 g topically 4 (four) times daily. To each shoulders   hydrocortisone 25 MG suppository Commonly known as: ANUSOL-HC Place 1 suppository (25 mg total) rectally every 12 (twelve) hours.   mupirocin ointment 2 % Commonly known as: BACTROBAN Place 1 application into the nose 2 (two) times daily.   sitaGLIPtin 100 MG tablet Commonly known as: JANUVIA Take 100 mg by mouth daily.   SUPER B COMPLEX PO Take 1 tablet by mouth daily.   traZODone 50 MG tablet Commonly known as: DESYREL Take 50 mg by mouth at bedtime.   vancomycin 125 MG capsule Commonly known as: Vancocin HCl Take 1 capsule (125 mg) QID x14 days, then 1BID x7 days (starting 10/18), then 1 QDx 7 days (starting 10/26), then 1 capsule every other day x7 days (starting 11/2), then 1 capsule every 3 days (starting 11/10). What changed:  how much to take how to take this when to take this additional instructions   Vitamin D3 25 MCG (1000 UT) Caps Take 1,000 Units by mouth daily.        DISCHARGE INSTRUCTIONS:   Follow-up physician at facility. Follow-up Dr. Maximino Greenlandahiliani 2 weeks   If you experience worsening of your admission symptoms, develop shortness of breath, life threatening emergency, suicidal or homicidal thoughts you must seek medical attention immediately by calling 911 or calling your MD immediately  if symptoms less severe.  You Must read complete instructions/literature along with all the possible adverse reactions/side effects for all the Medicines you take and that have been prescribed to you. Take any new Medicines after you have completely understood and accept all the possible adverse reactions/side effects.   Please note  You were cared for by a  hospitalist during your hospital stay. If you have any questions about your discharge medications or the care you received while you were in the hospital after you are discharged, you can call the unit and asked to speak with the hospitalist on call if the hospitalist that took care of you is not available. Once you are discharged, your primary care physician will handle any further medical issues. Please note that NO REFILLS for any discharge medications will be authorized once you are discharged, as it is imperative that you return to your primary care physician (or establish a relationship with a primary care physician if you do not have one) for your aftercare needs so that they can reassess your need for medications and monitor your lab values.    Today   CHIEF COMPLAINT:   Chief Complaint  Patient presents with  . Rectal Bleeding    HISTORY OF PRESENT ILLNESS:  Molly [name]  is a 74 y.o. female came in with  rectal bleeding   VITAL SIGNS:  Blood pressure 123/72, pulse (!) 101, temperature 98.1 F (36.7 C), resp. rate 18, height  (1.575 m), weight 91.2 kg, SpO2 98 %.  I/O:    Intake/Output Summary (Last 24 hours) at 01/02/2020 1158 Last data filed at 01/02/2020 0642 Gross per 24 hour  Intake --  Output 1001 ml  Net -1001 ml    PHYSICAL EXAMINATION:  GENERAL:  74 y.o.-year-old patient lying in the bed with no acute distress.  EYES: Pupils equal, round, reactive to light and accommodation. No scleral icterus.  HEENT: Head atraumatic, normocephalic. Oropharynx and nasopharynx clear.   LUNGS: Normal breath sounds bilaterally, no wheezing, rales,rhonchi or crepitation. No use of accessory muscles of respiration.  CARDIOVASCULAR: S1, S2 normal. No murmurs, rubs, or gallops.  ABDOMEN: Soft, non-tender, non-distended. EXTREMITIES: Trace pedal edema.  NEUROLOGIC: Cranial nerves II through XII are intact.  PSYCHIATRIC: The patient is alert and answers some questions.  SKIN: No  obvious rash, lesion, or ulcer.   DATA REVIEW:   CBC Recent Labs  Lab 01/02/20 0355  WBC 10.7*  HGB 12.2  HCT 37.1  PLT 302    Chemistries  Recent Labs  Lab 12/27/19 1020 12/28/19 0439 01/02/20 0355  NA 135   < > 135  K 4.2   < > 4.0  CL 92*   < > 96*  CO2 32   < > 29  GLUCOSE 144*   < > 140*  BUN 8   < > 9  CREATININE 0.58   < > 0.69  CALCIUM 8.4*   < > 8.8*  MG  --    < > 2.3  AST 40  --   --   ALT 35  --   --   ALKPHOS 47  --   --   BILITOT 1.0  --   --    < > = values in this interval not displayed.     Microbiology Results  Results for orders placed or performed during the hospital encounter of 12/27/19  MRSA PCR Screening     Status: Abnormal   Collection Time: 12/28/19  7:30 PM   Specimen: Nasal Mucosa; Nasopharyngeal  Result Value Ref Range Status   MRSA by PCR POSITIVE (A) NEGATIVE Final    Comment:        The GeneXpert MRSA Assay (FDA approved for NASAL specimens only), is one component of a comprehensive MRSA colonization surveillance program. It is not intended to diagnose MRSA infection nor to guide or monitor treatment for MRSA infections. CRITICAL RESULT CALLED TO, READ BACK BY AND VERIFIED WITH: DESIREE DONSON @ [phone] 12/29/19 RH Performed at [facility], [address], [place], [place] [address]   Respiratory Panel by RT PCR (Flu A&B, Covid) - Nasal Mucosa     Status: None   Collection Time: 12/28/19  8:00 PM   Specimen: Nasal Mucosa; Nasopharyngeal  Result Value Ref Range Status   SARS Coronavirus 2 by RT PCR NEGATIVE NEGATIVE Final    Comment: (NOTE) SARS-CoV-2 target nucleic acids are NOT DETECTED.  The SARS-CoV-2 RNA is generally detectable in upper respiratoy specimens during the acute phase of infection. The lowest concentration of SARS-CoV-2 viral copies this assay can detect is 131 copies/mL. A negative result does not preclude SARS-Cov-2 infection and should not be used as the sole basis for treatment or other  patient management decisions. A negative result may occur with  improper specimen collection/handling, submission of specimen other  than nasopharyngeal swab, presence of viral mutation(s) within the areas targeted by this assay, and inadequate number of viral copies (<131 copies/mL). A negative result must be combined with clinical observations, patient history, and epidemiological information. The expected result is Negative.  Fact Sheet for Patients:  https://www.moore.com/  Fact Sheet for Healthcare Providers:  https://www.young.biz/  This test is no t yet approved or cleared by the [place] FDA and  has been authorized for detection and/or diagnosis of SARS-CoV-2 by FDA under an Emergency Use Authorization (EUA). This EUA will remain  in effect (meaning this test can be used) for the duration of the COVID-19 declaration under Section 564(b)(1) of the Act, 21 U.S.C. section 360bbb-3(b)(1), unless the authorization is terminated or revoked sooner.     Influenza A by PCR NEGATIVE NEGATIVE Final   Influenza B by PCR NEGATIVE NEGATIVE Final    Comment: (NOTE) The Xpert Xpress SARS-CoV-2/FLU/RSV assay is intended as an aid in  the diagnosis of influenza from Nasopharyngeal swab specimens and  should not be used as a sole basis for treatment. Nasal washings and  aspirates are unacceptable for Xpert Xpress SARS-CoV-2/FLU/RSV  testing.  Fact Sheet for Patients: https://www.moore.com/  Fact Sheet for Healthcare Providers: https://www.young.biz/  This test is not yet approved or cleared by the [place] FDA and  has been authorized for detection and/or diagnosis of SARS-CoV-2 by  FDA under an Emergency Use Authorization (EUA). This EUA will remain  in effect (meaning this test can be used) for the duration of the  Covid-19 declaration under Section 564(b)(1) of the Act, 21  U.S.C. section  360bbb-3(b)(1), unless the authorization is  terminated or revoked. Performed at [facility], [address] [place], [place] [address]   Respiratory Panel by RT PCR (Flu A&B, Covid) - Nasopharyngeal Swab     Status: None   Collection Time: 01/01/20  5:49 PM   Specimen: Nasopharyngeal Swab  Result Value Ref Range Status   SARS Coronavirus 2 by RT PCR NEGATIVE NEGATIVE Final    Comment: (NOTE) SARS-CoV-2 target nucleic acids are NOT DETECTED.  The SARS-CoV-2 RNA is generally detectable in upper respiratoy specimens during the acute phase of infection. The lowest concentration of SARS-CoV-2 viral copies this assay can detect is 131 copies/mL. A negative result does not preclude SARS-Cov-2 infection and should not be used as the sole basis for treatment or other patient management decisions. A negative result may occur with  improper specimen collection/handling, submission of specimen other than nasopharyngeal swab, presence of viral mutation(s) within the areas targeted by this assay, and inadequate number of viral copies (<131 copies/mL). A negative result must be combined with clinical observations, patient history, and epidemiological information. The expected result is Negative.  Fact Sheet for Patients:  https://www.moore.com/  Fact Sheet for Healthcare Providers:  https://www.young.biz/  This test is no t yet approved or cleared by the [place] FDA and  has been authorized for detection and/or diagnosis of SARS-CoV-2 by FDA under an Emergency Use Authorization (EUA). This EUA will remain  in effect (meaning this test can be used) for the duration of the COVID-19 declaration under Section 564(b)(1) of the Act, 21 U.S.C. section 360bbb-3(b)(1), unless the authorization is terminated or revoked sooner.     Influenza A by PCR NEGATIVE NEGATIVE Final   Influenza B by PCR NEGATIVE NEGATIVE Final    Comment: (NOTE) The  Xpert Xpress SARS-CoV-2/FLU/RSV assay is intended as an aid in  the diagnosis of influenza from Nasopharyngeal  swab specimens and  should not be used as a sole basis for treatment. Nasal washings and  aspirates are unacceptable for Xpert Xpress SARS-CoV-2/FLU/RSV  testing.  Fact Sheet for Patients: https://www.moore.com/  Fact Sheet for Healthcare Providers: https://www.young.biz/  This test is not yet approved or cleared by the [place] FDA and  has been authorized for detection and/or diagnosis of SARS-CoV-2 by  FDA under an Emergency Use Authorization (EUA). This EUA will remain  in effect (meaning this test can be used) for the duration of the  Covid-19 declaration under Section 564(b)(1) of the Act, 21  U.S.C. section 360bbb-3(b)(1), unless the authorization is  terminated or revoked. Performed at [facility], [address] [place], [place] [address]     RADIOLOGY:  No results found.   Management plans discussed with the patient, family and they are in agreement.  CODE STATUS:     Code Status Orders  (From admission, onward)         Start     Ordered   12/27/19 2155  Full code  Continuous        12/27/19 2154        Code Status History    Date Active Date Inactive Code Status Order ID Comments User Context   12/12/2019 0246 12/18/2019 2104 Full Code [id number]  Hannah Beat, MD ED   04/27/2016 0857 04/30/2016 1830 Full Code [id number]  Milagros Loll, MD ED   03/05/2016 1549 03/12/2016 1723 Full Code [id number]  Eugenie Norrie, NP ED   02/06/2016 1532 02/11/2016 1925 Full Code [id number]  Hower, Cletis Athens, MD ED   02/01/2016 2117 02/05/2016 1359 Full Code [id number]  Auburn Bilberry, MD Inpatient   01/14/2016 1401 01/16/2016 2222 Full Code [id number]  Katha Hamming, MD ED   11/19/2014 2240 11/21/2014 2055 Full Code [id number]  Oralia Manis, MD Inpatient   05/22/2014 1831 05/25/2014 2020 Full Code [id number]  Toni Arthurs, MD Inpatient   05/21/2014 2231 05/22/2014 1831 Full Code [id number]  Yevonne Pax, MD Inpatient   Advance Care Planning Activity      TOTAL TIME TAKING CARE OF THIS PATIENT: 35 minutes.    Alford Highland M.D on 01/02/2020 at 11:58 AM  Between 7am to 6pm - Pager - [phone]  After 6pm go to [email or web address] - password [email or web address]  [facility]  CC: Primary care physician; Ethelda Chick, MD

## 2020-01-01 NOTE — Discharge Instructions (Signed)
Lower Gastrointestinal Bleeding  Lower gastrointestinal (GI) bleeding is the result of bleeding from the colon, rectum, or anal area. The colon is the last part of the digestive tract, where stool, also called feces, is formed. If you have lower GI bleeding, you may see blood in or on your stool. It may be bright red. Lower GI bleeding often stops without treatment. Continued or heavy bleeding needs emergency treatment at the hospital. What are the causes? Lower GI bleeding may be caused by:  A condition that causes pouches to form in the colon over time (diverticulosis).  Swelling and irritation (inflammation) in areas with diverticulosis (diverticulitis).  Inflammation of the colon (inflammatory bowel disease).  Swollen veins in the rectum (hemorrhoids).  Painful tears in the anus (anal fissures), often caused by passing hard stools.  Cancer of the colon or rectum.  Noncancerous growths (polyps) of the colon or rectum.  A bleeding disorder that impairs the formation of blood clots and causes easy bleeding (coagulopathy).  An abnormal weakening of a blood vessel where an artery and a vein come together (arteriovenous malformation). What increases the risk? You are more likely to develop this condition if:  You are older than 74 years of age.  You take aspirin or NSAIDs on a regular basis.  You take anticoagulant or antiplatelet drugs.  You have a history of high-dose X-ray treatment (radiation therapy) of the colon.  You recently had a colon polyp removed. What are the signs or symptoms? Symptoms of this condition include:  Bright red blood or blood clots coming from your rectum.  Bloody stools.  Black or maroon-colored stools.  Pain or cramping in the abdomen.  Weakness or dizziness.  Racing heartbeat. How is this diagnosed? This condition may be diagnosed based on:  Your symptoms and medical history.  A physical exam. During the exam, your health care  provider will check for signs of blood loss, such as low blood pressure and a rapid pulse.  Tests, such as: ? Flexible sigmoidoscopy. In this procedure, a flexible tube with a camera on the end is used to examine your anus and the first part of your colon to look for the source of bleeding. ? Colonoscopy. This is similar to a flexible sigmoidoscopy, but the camera can extend all the way to the uppermost part of your colon. ? Blood tests to measure your red blood cell count and to check for coagulopathy. ? An imaging study of your colon to look for a bleeding site. In some cases, you may have X-rays taken after a dye or radioactive substance is injected into your bloodstream (angiogram). How is this treated? Treatment for this condition depends on the cause of the bleeding. Heavy or persistent bleeding is treated at the hospital. Treatment may include:  Getting fluids through an IV tube inserted into one of your veins.  Getting blood through an IV tube (blood transfusion).  Stopping bleeding through high-heat coagulation, injections of certain medicines, or applying surgical clips. This can all be done during a colonoscopy.  Having a procedure that involves first doing an angiogram and then blocking blood flow to the bleeding site (embolization).  Stopping some of your regular medicines for a certain amount of time.  Having surgery to remove part of the colon. This may be needed if bleeding is severe and does not respond to other treatment. Follow these instructions at home:  Take over-the-counter and prescription medicines only as told by your health care provider. You may need to   avoid aspirin, NSAIDs, or other medicines that increase bleeding.  Eat foods that are high in fiber. This will help keep your stools soft. These foods include whole grains, legumes, fruits, and vegetables. Eating 1-3 prunes each day works well for many people.  Drink enough fluid to keep your urine clear or pale  yellow.  Keep all follow-up visits as told by your health care provider. This is important. Contact a health care provider if:  Your symptoms do not improve. Get help right away if:  Your bleeding increases.  You feel light-headed or you faint.  You feel weak.  You have severe cramps in your back or abdomen.  You pass large blood clots in your stool.  Your symptoms get worse. This information is not intended to replace advice given to you by your health care provider. Make sure you discuss any questions you have with your health care provider. Document Revised: 07/08/2018 Document Reviewed: 08/01/2015 Elsevier Patient Education  2020 [employer]  

## 2020-01-01 NOTE — Progress Notes (Signed)
Patient ID: Molly [name], female   DOB: 01/13/1946, 74 y.o.   MRN: [id number] [facility] PROGRESS NOTE  Molly [name] NID:[id number] DOB: [birth year] DOA: 12/27/2019 PCP: Ethelda Chick, MD  HPI/Subjective: Notified this afternoon that we did not get insurance authorization to go back to her facility today.  After a work-up for disposition today the patient did have a few episodes of diarrhea.  Objective: Vitals:   01/01/20 0818 01/01/20 1629  BP: 99/70 124/76  Pulse: 100 (!) 101  Resp: 16 17  Temp: 98.7 F (37.1 C) 98.3 F (36.8 C)  SpO2: 100% 95%    Intake/Output Summary (Last 24 hours) at 01/01/2020 1652 Last data filed at 01/01/2020 1440 Gross per 24 hour  Intake 3 ml  Output 753 ml  Net -750 ml   Filed Weights   12/27/19 0913 12/28/19 1817 12/29/19 0511  Weight: 94.9 kg 91.2 kg 91.2 kg    ROS: Review of Systems  Respiratory: Negative for shortness of breath.   Cardiovascular: Negative for chest pain.  Gastrointestinal: Negative for abdominal pain.   Exam: Physical Exam HENT:     Nose: No mucosal edema.     Mouth/Throat:     Pharynx: No oropharyngeal exudate.  Eyes:     General: Lids are normal.     Conjunctiva/sclera: Conjunctivae normal.  Cardiovascular:     Rate and Rhythm: Normal rate and regular rhythm.     Heart sounds: Normal heart sounds, S1 normal and S2 normal.  Pulmonary:     Breath sounds: Examination of the right-lower field reveals decreased breath sounds. Examination of the left-lower field reveals decreased breath sounds. Decreased breath sounds present. No wheezing, rhonchi or rales.  Abdominal:     Palpations: Abdomen is soft.     Tenderness: There is no abdominal tenderness.  Musculoskeletal:     Right lower leg: Swelling present.     Left lower leg: Swelling present.  Skin:    General: Skin is warm.     Findings: No rash.  Neurological:     Mental Status: She is alert.       Data Reviewed: Basic Metabolic  Panel: Recent Labs  Lab 12/27/19 1020 12/28/19 0439 12/29/19 0450 12/30/19 0332 01/01/20 0400  NA 135 136 135 135 135  K 4.2 4.2 4.1 4.0 4.0  CL 92* 98 96* 95* 95*  CO2 32 25 30 30 29   GLUCOSE 144* 112* 109* 110* 141*  BUN 8 11 9  7* 9  CREATININE 0.58 0.64 0.44 0.61 0.68  CALCIUM 8.4* 8.1* 8.7* 9.0 8.5*  MG  --   --   --   --  2.3   Liver Function Tests: Recent Labs  Lab 12/26/19 1143 12/27/19 1020  AST 51* 40  ALT 43 35  ALKPHOS 53 47  BILITOT 0.9 1.0  PROT 6.9 6.4*  ALBUMIN 2.3* 2.1*   Recent Labs  Lab 12/26/19 1143  LIPASE 23   CBC: Recent Labs  Lab 12/26/19 1143 12/26/19 1143 12/27/19 1020 12/27/19 1020 12/28/19 0013 12/28/19 0013 12/28/19 0439 12/28/19 1251 12/29/19 0450 12/29/19 1214 12/30/19 0332 12/31/19 0750 01/01/20 0400  WBC 8.0  --  8.7  --   --   --  9.0  --   --   --  6.5  --   --   HGB 10.3*   < > 9.4*   < > 9.6*   < > 11.9*   < > 10.6* 10.4* 10.3* 10.8* 11.3*  HCT 31.8*  --  29.4*  --  30.4*  --  37.4  --   --   --  32.4*  --   --   MCV 89.8  --  89.4  --   --   --  94.0  --   --   --  92.3  --   --   PLT 380  --  386  --   --   --  318  --   --   --  312  --   --    < > = values in this interval not displayed.    CBG: Recent Labs  Lab 01/01/20 0007 01/01/20 0413 01/01/20 0820 01/01/20 1222 01/01/20 1629  GLUCAP 126* 147* 150* 128* 149*    Recent Results (from the past 240 hour(s))  MRSA PCR Screening     Status: Abnormal   Collection Time: 12/28/19  7:30 PM   Specimen: Nasal Mucosa; Nasopharyngeal  Result Value Ref Range Status   MRSA by PCR POSITIVE (A) NEGATIVE Final    Comment:        The GeneXpert MRSA Assay (FDA approved for NASAL specimens only), is one component of a comprehensive MRSA colonization surveillance program. It is not intended to diagnose MRSA infection nor to guide or monitor treatment for MRSA infections. CRITICAL RESULT CALLED TO, READ BACK BY AND VERIFIED WITH: DESIREE DONSON @ [phone] 12/29/19  RH Performed at [facility], [address] [place], [place] [address]   Respiratory Panel by RT PCR (Flu A&B, Covid) - Nasal Mucosa     Status: None   Collection Time: 12/28/19  8:00 PM   Specimen: Nasal Mucosa; Nasopharyngeal  Result Value Ref Range Status   SARS Coronavirus 2 by RT PCR NEGATIVE NEGATIVE Final    Comment: (NOTE) SARS-CoV-2 target nucleic acids are NOT DETECTED.  The SARS-CoV-2 RNA is generally detectable in upper respiratoy specimens during the acute phase of infection. The lowest concentration of SARS-CoV-2 viral copies this assay can detect is 131 copies/mL. A negative result does not preclude SARS-Cov-2 infection and should not be used as the sole basis for treatment or other patient management decisions. A negative result may occur with  improper specimen collection/handling, submission of specimen other than nasopharyngeal swab, presence of viral mutation(s) within the areas targeted by this assay, and inadequate number of viral copies (<131 copies/mL). A negative result must be combined with clinical observations, patient history, and epidemiological information. The expected result is Negative.  Fact Sheet for Patients:  https://www.moore.com/  Fact Sheet for Healthcare Providers:  https://www.young.biz/  This test is no t yet approved or cleared by the [place] FDA and  has been authorized for detection and/or diagnosis of SARS-CoV-2 by FDA under an Emergency Use Authorization (EUA). This EUA will remain  in effect (meaning this test can be used) for the duration of the COVID-19 declaration under Section 564(b)(1) of the Act, 21 U.S.C. section 360bbb-3(b)(1), unless the authorization is terminated or revoked sooner.     Influenza A by PCR NEGATIVE NEGATIVE Final   Influenza B by PCR NEGATIVE NEGATIVE Final    Comment: (NOTE) The Xpert Xpress SARS-CoV-2/FLU/RSV assay is intended as an aid in   the diagnosis of influenza from Nasopharyngeal swab specimens and  should not be used as a sole basis for treatment. Nasal washings and  aspirates are unacceptable for Xpert Xpress SARS-CoV-2/FLU/RSV  testing.  Fact Sheet for Patients: https://www.moore.com/  Fact Sheet for Healthcare Providers: https://www.young.biz/  This test is  not yet approved or cleared by the [place] FDA and  has been authorized for detection and/or diagnosis of SARS-CoV-2 by  FDA under an Emergency Use Authorization (EUA). This EUA will remain  in effect (meaning this test can be used) for the duration of the  Covid-19 declaration under Section 564(b)(1) of the Act, 21  U.S.C. section 360bbb-3(b)(1), unless the authorization is  terminated or revoked. Performed at [facility], [address] [place], [place] [id number]      Scheduled Meds: . acidophilus  2 capsule Oral TID  . amitriptyline  75 mg Oral QHS  . Chlorhexidine Gluconate Cloth  6 each Topical Q0600  . insulin aspart  0-15 Units Subcutaneous Q4H  . mupirocin ointment  1 application Nasal BID  . sodium chloride flush  3 mL Intravenous Q12H  . traZODone  50 mg Oral QHS    Assessment/Plan:  1. C. difficile colitis.  With patient having more diarrhea will restart vancomycin and likely will need an extended course.  Patient had couple large bowel movements this morning. 2. Acute metabolic encephalopathy.  Patient's mental status has been good around breakfast time.  CT scan of the head was negative for acute stroke.  We will get an ABG to see if she has any CO2 retention.  We will get an overnight oximetry to see if she qualifies for oxygen while sleeping. 3. Acute blood loss anemia with lower GI bleed.  The patient did receive 2 units of packed red blood cells during the hospital stay.  Hemoglobin now stabilized. 4. Stage II coccyx decubiti present on admission. 5. Weakness.  Physical  therapy recommends rehab.  Awaiting insurance authorization to go back to her facility. 6. Type 2 diabetes mellitus with hyperlipidemia can go back on Januvia as outpatient.  Pressure Injury 12/28/19 Coccyx Left Stage 2 -  Partial thickness loss of dermis presenting as a shallow open injury with a red, pink wound bed without slough. (Active)  12/28/19 1845  Location: Coccyx  Location Orientation: Left  Staging: Stage 2 -  Partial thickness loss of dermis presenting as a shallow open injury with a red, pink wound bed without slough.  Wound Description (Comments):   Present on Admission: Yes       Code Status:     Code Status Orders  (From admission, onward)         Start     Ordered   12/27/19 2155  Full code  Continuous        12/27/19 2154        Code Status History    Date Active Date Inactive Code Status Order ID Comments User Context   12/12/2019 0246 12/18/2019 2104 Full Code [id number]  Hannah BeatMansy, Jan A, MD ED   04/27/2016 0857 04/30/2016 1830 Full Code [id number]  Milagros LollSudini, Srikar, MD ED   03/05/2016 1549 03/12/2016 1723 Full Code [id number]  Eugenie NorrieBlakeney, Dana G, NP ED   02/06/2016 1532 02/11/2016 1925 Full Code [id number]  Hower, Cletis Athensavid K, MD ED   02/01/2016 2117 02/05/2016 1359 Full Code [id number]  Auburn BilberryPatel, Shreyang, MD Inpatient   01/14/2016 1401 01/16/2016 2222 Full Code [id number]  Katha HammingKonidena, Snehalatha, MD ED   11/19/2014 2240 11/21/2014 2055 Full Code [id number]  Oralia ManisWillis, David, MD Inpatient   05/22/2014 1831 05/25/2014 2020 Full Code [id number]  Toni ArthursHewitt, John, MD Inpatient   05/21/2014 2231 05/22/2014 1831 Full Code [id number]  Yevonne PaxKhan, Saadat A, MD Inpatient   Advance Care Planning Activity     Family Communication:  Spoke with husband on the phone. Disposition Plan: Status is: Inpatient  Dispo: The patient is from: [facility] facility              Anticipated d/c is to: [facility] facility              Anticipated d/c date is: Did not get insurance authorization today.               Patient currently having more diarrhea so I will put back on vancomycin and give a long tapering course.  Patient intermittently has altered mental status.  Getting overnight oximetry and ABG while she is here.  Antibiotics:  Restart oral vancomycin  Time spent: 45 minutes  Katianna Mcclenney [employer]

## 2020-01-01 NOTE — Progress Notes (Signed)
Physical Therapy Treatment Patient Details Name: Molly [name] MRN: [id number] DOB: Jul 28, 1945 Today's Date: 01/01/2020    History of Present Illness Molly [name] is a 74 y.o. female with Past medical history of CVA with residual left-sided weakness, seizures, L toe amputation, HTN, HLD, IDDM, COPD, anxiety, recent fall recent history of C. Difficile (still being treated), presented at [facility] ED from rehab due to lower GI bleeding. Last admission approximately 2 weeks ago. Patient was seen in the ED 12/28/2019 due to GI bleeding, CT scan was done which showed  stercoral Proctocolitis, Hb was 10.3 no significant drop so patient was discharged back to rehab.  12/27/19 again patient had significant lower GI bleeding, dark-colored stool so patient was sent back to the ED for work-up. Is still being treated for C. Diff. Admitted for GI bleed as principle problem. GI consult suggested colonoscopy in hospital or outpatient depending on Plavix.    PT Comments    Pt presents supine leaning towards her L side on arrival to room. She is lethargic throughout session, but able to engage with supine exercises. Repositioned patient so she's more in the middle of the bed and put a pillow underneath her L arm. Patient left in bed with all needs. Pt will benefit from skilled PT services to address deficits in strength, balance, and return to PLOF or maximal functional independence.   Follow Up Recommendations  SNF     Equipment Recommendations  Rolling walker with 5" wheels;Standard walker    Recommendations for Other Services       Precautions / Restrictions Precautions Precautions: Fall Restrictions Weight Bearing Restrictions: No Other Position/Activity Restrictions: Residual L sided weakness    Mobility  Bed Mobility               General bed mobility comments: Did not perform due to patient declining  Transfers                 General transfer comment: Did not perform due to  patient declining  Ambulation/Gait             General Gait Details: Did not perform due to pt unsafe/ unable   Stairs             Wheelchair Mobility    Modified Rankin (Stroke Patients Only)       Balance       Sitting balance - Comments: Did not assess due to patient declining       Standing balance comment: Did not assess                            Cognition Arousal/Alertness: Lethargic Behavior During Therapy: Flat affect Overall Cognitive Status: No family/caregiver present to determine baseline cognitive functioning                                 General Comments: Patient unable to tell me her name or what she wants to be called      Exercises General Exercises - Upper Extremity Elbow Flexion: AROM;5 reps;Right;Supine Elbow Extension: AROM;Right;5 reps;Supine General Exercises - Lower Extremity Ankle Circles/Pumps: AROM;Right;Other reps (comment);Supine (3 reps) Quad Sets: AROM;Both;Other reps (comment);Supine (3 reps) Gluteal Sets: AROM;Both;5 reps;Supine Heel Slides: AAROM;Both;5 reps;Supine Straight Leg Raises: AAROM;Both;5 reps;Supine    General Comments General comments (skin integrity, edema, etc.): Patient's LUE swollen and unable to open L hand fully  Pertinent Vitals/Pain Pain Assessment: Faces Faces Pain Scale: Hurts a little bit Pain Location: LUE Pain Descriptors / Indicators: Discomfort Pain Intervention(s): Monitored during session;Repositioned    Home Living                      Prior Function            PT Goals (current goals can now be found in the care plan section) Acute Rehab PT Goals Patient Stated Goal: To go home PT Goal Formulation: Patient unable to participate in goal setting Time For Goal Achievement: 01/13/20 Potential to Achieve Goals: Poor Progress towards PT goals: Progressing toward goals    Frequency    Min 2X/week      PT Plan Current plan  remains appropriate    Co-evaluation              AM-PAC PT "6 Clicks" Mobility   Outcome Measure  Help needed turning from your back to your side while in a flat bed without using bedrails?: A Lot Help needed moving from lying on your back to sitting on the side of a flat bed without using bedrails?: A Lot Help needed moving to and from a bed to a chair (including a wheelchair)?: Total Help needed standing up from a chair using your arms (e.g., wheelchair or bedside chair)?: Total Help needed to walk in hospital room?: Total Help needed climbing 3-5 steps with a railing? : Total 6 Click Score: 8    End of Session   Activity Tolerance: Patient limited by lethargy Patient left: in bed;with call bell/phone within reach;with bed alarm set Nurse Communication: Mobility status PT Visit Diagnosis: Unsteadiness on feet (R26.81);Other abnormalities of gait and mobility (R26.89);Muscle weakness (generalized) (M62.81);Difficulty in walking, not elsewhere classified (R26.2)     Time: 6387-5643 PT Time Calculation (min) (ACUTE ONLY): 12 min  Charges:                          Molly [name], SPT Molly [name] 01/01/2020, 4:01 PM

## 2020-01-02 ENCOUNTER — Inpatient Hospital Stay: Payer: Medicare HMO

## 2020-01-02 DIAGNOSIS — D62 Acute posthemorrhagic anemia: Secondary | ICD-10-CM

## 2020-01-02 DIAGNOSIS — N939 Abnormal uterine and vaginal bleeding, unspecified: Secondary | ICD-10-CM

## 2020-01-02 LAB — GLUCOSE, CAPILLARY
Glucose-Capillary: 112 mg/dL — ABNORMAL HIGH (ref 70–99)
Glucose-Capillary: 125 mg/dL — ABNORMAL HIGH (ref 70–99)
Glucose-Capillary: 138 mg/dL — ABNORMAL HIGH (ref 70–99)
Glucose-Capillary: 139 mg/dL — ABNORMAL HIGH (ref 70–99)
Glucose-Capillary: 176 mg/dL — ABNORMAL HIGH (ref 70–99)
Glucose-Capillary: 229 mg/dL — ABNORMAL HIGH (ref 70–99)

## 2020-01-02 LAB — CBC WITH DIFFERENTIAL/PLATELET
Abs Immature Granulocytes: 0.07 10*3/uL (ref 0.00–0.07)
Basophils Absolute: 0.1 10*3/uL (ref 0.0–0.1)
Basophils Relative: 1 %
Eosinophils Absolute: 0.1 10*3/uL (ref 0.0–0.5)
Eosinophils Relative: 1 %
HCT: 37.1 % (ref 36.0–46.0)
Hemoglobin: 12.2 g/dL (ref 12.0–15.0)
Immature Granulocytes: 1 %
Lymphocytes Relative: 24 %
Lymphs Abs: 2.5 10*3/uL (ref 0.7–4.0)
MCH: 29.5 pg (ref 26.0–34.0)
MCHC: 32.9 g/dL (ref 30.0–36.0)
MCV: 89.8 fL (ref 80.0–100.0)
Monocytes Absolute: 1.2 10*3/uL — ABNORMAL HIGH (ref 0.1–1.0)
Monocytes Relative: 11 %
Neutro Abs: 6.8 10*3/uL (ref 1.7–7.7)
Neutrophils Relative %: 62 %
Platelets: 302 10*3/uL (ref 150–400)
RBC: 4.13 MIL/uL (ref 3.87–5.11)
RDW: 16.9 % — ABNORMAL HIGH (ref 11.5–15.5)
WBC: 10.7 10*3/uL — ABNORMAL HIGH (ref 4.0–10.5)
nRBC: 0 % (ref 0.0–0.2)

## 2020-01-02 LAB — BASIC METABOLIC PANEL
Anion gap: 10 (ref 5–15)
BUN: 9 mg/dL (ref 8–23)
CO2: 29 mmol/L (ref 22–32)
Calcium: 8.8 mg/dL — ABNORMAL LOW (ref 8.9–10.3)
Chloride: 96 mmol/L — ABNORMAL LOW (ref 98–111)
Creatinine, Ser: 0.69 mg/dL (ref 0.44–1.00)
GFR calc Af Amer: >60 mL/min (ref >60)
GFR calc non Af Amer: >60 mL/min (ref >60)
Glucose, Bld: 140 mg/dL — ABNORMAL HIGH (ref 70–99)
Potassium: 4 mmol/L (ref 3.5–5.1)
Sodium: 135 mmol/L (ref 135–145)

## 2020-01-02 LAB — HEMOGLOBIN: Hemoglobin: 11.1 g/dL — ABNORMAL LOW (ref 12.0–15.0)

## 2020-01-02 LAB — MAGNESIUM: Magnesium: 2.3 mg/dL (ref 1.7–2.4)

## 2020-01-02 MED ORDER — VANCOMYCIN HCL 125 MG PO CAPS: 125.0000 mg | ORAL_CAPSULE | Freq: Four times a day (QID) | ORAL | 0 refills | Status: DC

## 2020-01-02 MED ORDER — VANCOMYCIN HCL 125 MG PO CAPS: ORAL_CAPSULE | ORAL | 0 refills | Status: DC

## 2020-01-02 NOTE — Consult Note (Signed)
Consult History and Physical   [facility]   Patient Name: Molly BromeBRENDA P Sulewski Patient MRN:   [id number]  WU:[id number]:consulted to see pt with vaginal bleeding   HPI: Molly [name] is a 74 y.o. No obstetric history on file.  Pt was admitted for GI bleed . Known c difficle. Pt was noted to to have blood from the vagina today .  She denies previous vaginal bleeding    Review of Systems: positives in bold GEN:   fevers, chills, weight changes, appetite changes, fatigue, night sweats HEENT:  HA, vision changes, hearing loss, congestion, rhinorrhea, sinus pressure, dysphagia CV:   CP, palpitations PULM:  SOB, cough GI:  abd pain, N/V/D/C GU:  dysuria, urgency, frequency MSK:  arthralgias, myalgias, back pain, swelling SKIN:  rashes, color changes, pallor NEURO:  numbness, weakness, tingling, seizures, dizziness, tremors PSYCH:  depression, anxiety, behavioral problems, confusion  HEME/LYMPH:  easy bruising or bleeding ENDO:  heat/cold intolerance  Past Obstetrical History: OB History   No obstetric history on file.     Past Gynecologic History: No LMP recorded. Patient is postmenopausal. Menstrual frequency. Postmenopausal  History: Past Medical History:  Diagnosis Date  . Anxiety   . COPD (chronic obstructive pulmonary disease) (HCC)   . Diabetes (HCC)   . Hemorrhoid   . HLD (hyperlipidemia)   . HTN (hypertension)   . Stroke [facility](HCC) 2008   left weakness    Past Surgical History:   Past Surgical History:  Procedure Laterality Date  . AMPUTATION Left 05/22/2014   Procedure: AMPUTATION RAY LEFT FOURTH AND FIFTH ;  Surgeon: Toni ArthursJohn Hewitt, MD;  [facility];  Service: Orthopedics;  Laterality: Left;  . CATARACT EXTRACTION    . COLONOSCOPY  2004  . COLONOSCOPY WITH PROPOFOL N/A 04/29/2016   Procedure: COLONOSCOPY WITH PROPOFOL;  Surgeon: Wyline MoodKiran Anna, MD;  [facility];  Service: Gastroenterology;  Laterality: N/A;  .  ESOPHAGOGASTRODUODENOSCOPY (EGD) WITH PROPOFOL N/A 04/27/2016   Procedure: ESOPHAGOGASTRODUODENOSCOPY (EGD) WITH PROPOFOL;  Surgeon: Midge Miniumarren Wohl, MD;  Location: [facility] ENDOSCOPY;  Service: Endoscopy;  Laterality: N/A;    Family History:  family history includes Breast cancer in her paternal aunt; Deep vein thrombosis in her father; Hyperlipidemia in her mother; Stroke in her father, maternal grandmother, and paternal grandmother; Varicose Veins in her mother.  Social History:  Social History   Socioeconomic History  . Marital status: Married    Spouse name: Not on file  . Number of children: Not on file  . Years of education: Not on file  . Highest education level: Not on file  Occupational History  . Not on file  Tobacco Use  . Smoking status: Former Smoker    Packs/day: 0.50    Years: 2.00    Pack years: 1.00    Types: Cigarettes    Quit date: 11/18/1968    Years since quitting: 51.1  . Smokeless tobacco: Never Used  Substance and Sexual Activity  . Alcohol use: No    Alcohol/week: 0.0 standard drinks    Comment: seldom  . Drug use: No  . Sexual activity: Never  Other Topics Concern  . Not on file  Social History Narrative  . Not on file   Social Determinants of Health   Financial Resource Strain:   . Difficulty of Paying Living Expenses: Not on file  Food Insecurity:   . Worried About Programme researcher, broadcasting/film/videounning Out of Food in the Last Year: Not on file  . Ran Out of Food in the  Last Year: Not on file  Transportation Needs:   . Lack of Transportation (Medical): Not on file  . Lack of Transportation (Non-Medical): Not on file  Physical Activity:   . Days of Exercise per Week: Not on file  . Minutes of Exercise per Session: Not on file  Stress:   . Feeling of Stress : Not on file  Social Connections:   . Frequency of Communication with Friends and Family: Not on file  . Frequency of Social Gatherings with Friends and Family: Not on file  . Attends Religious Services: Not on file  .  Active Member of Clubs or Organizations: Not on file  . Attends Banker Meetings: Not on file  . Marital Status: Not on file  Intimate Partner Violence:   . Fear of Current or Ex-Partner: Not on file  . Emotionally Abused: Not on file  . Physically Abused: Not on file  . Sexually Abused: Not on file    Home Medications:  Medications reconciled in EPIC  No current facility-administered medications on file prior to encounter.   Current Outpatient Medications on File Prior to Encounter  Medication Sig Dispense Refill  . acetaminophen (TYLENOL) 325 MG tablet Take 650 mg by mouth every 6 (six) hours as needed.    Marland Kitchen acidophilus (RISAQUAD) CAPS capsule Take 2 capsules by mouth 3 (three) times daily for 15 days. 90 capsule 0  . albuterol (PROVENTIL HFA;VENTOLIN HFA) 108 (90 Base) MCG/ACT inhaler ALBUTEROL 90 MCG/ACT AERS    . amitriptyline (ELAVIL) 75 MG tablet Take 75 mg by mouth at bedtime.     Marland Kitchen aspirin EC 81 MG tablet Take 81 mg by mouth daily.     Marland Kitchen atorvastatin (LIPITOR) 20 MG tablet Take 20 mg by mouth daily at 6 PM.     . B Complex-C (SUPER B COMPLEX PO) Take 1 tablet by mouth daily.     . Cholecalciferol (VITAMIN D3) 1000 UNITS CAPS Take 1,000 Units by mouth daily.    . clopidogrel (PLAVIX) 75 MG tablet Take 75 mg by mouth.    . diclofenac sodium (VOLTAREN) 1 % GEL Apply 2 g topically 4 (four) times daily. To each shoulders    . docusate sodium (COLACE) 100 MG capsule Take 1 capsule (100 mg total) by mouth 2 (two) times daily. 60 capsule 2  . hydrocortisone (ANUSOL-HC) 25 MG suppository Place 1 suppository (25 mg total) rectally every 12 (twelve) hours. 12 suppository 1  . insulin aspart (NOVOLOG) 100 UNIT/ML injection Inject 0-20 Units into the skin 3 (three) times daily with meals. (Patient taking differently: Inject 0-20 Units into the skin as needed. ) 10 mL 11  . magnesium oxide (MAG-OX) 400 MG tablet Take 400 mg by mouth 2 (two) times daily.    . polyethylene glycol  (MIRALAX / GLYCOLAX) 17 g packet Take 17 g by mouth daily as needed.    . senna-docusate (SENOKOT-S) 8.6-50 MG tablet Take 1 tablet by mouth at bedtime as needed for mild constipation. 30 tablet 0  . sitaGLIPtin (JANUVIA) 100 MG tablet Take 100 mg by mouth daily.    . traZODone (DESYREL) 50 MG tablet Take 50 mg by mouth at bedtime.       Allergies:  No Known Allergies  Physical Exam:  Temp:  [98.1 F (36.7 C)-98.7 F (37.1 C)] 98.1 F (36.7 C) (10/05 0743) Pulse Rate:  [97-101] 101 (10/05 0743) Resp:  [17-19] 18 (10/05 0743) BP: (123-138)/(66-76) 123/72 (10/05 0743) SpO2:  [95 %-98 %]  98 % (10/05 0743)   General Appearance:  Well developed, well nourished, no acute distress, alert and oriented x3 HEENT:  Normocephalic atraumatic, extraocular movements intact, moist mucous membranes Cardiovascular:  Normal S1/S2, regular rate and rhythm, no murmurs Pulmonary:  clear to auscultation, no wheezes, rales or rhonchi, symmetric air entry, good air exchange Abdomen:  Bowel sounds present, soft, nontender, nondistended, no abnormal masses, no epigastric pain  Pelvic:   V/V : no lesions , no blood noted in vagina cx : no lesions not CMT   Utx: no mass appreciated , exam limited due to patients discomfort  No adnexal mass   Labs/Studies:   CBC and Coags:  Lab Results  Component Value Date   WBC 10.7 (H) 01/02/2020   NEUTOPHILPCT 62 01/02/2020   EOSPCT 1 01/02/2020   BASOPCT 1 01/02/2020   LYMPHOPCT 24 01/02/2020   HGB 12.2 01/02/2020   HCT 37.1 01/02/2020   MCV 89.8 01/02/2020   PLT 302 01/02/2020   INR 1.1 12/28/2019   CMP:  Lab Results  Component Value Date   NA 135 01/02/2020   K 4.0 01/02/2020   CL 96 (L) 01/02/2020   CO2 29 01/02/2020   BUN 9 01/02/2020   CREATININE 0.69 01/02/2020   CREATININE 0.68 01/01/2020   CREATININE 0.61 12/30/2019   PROT 6.4 (L) 12/27/2019   BILITOT 1.0 12/27/2019   ALT 35 12/27/2019   AST 40 12/27/2019   ALKPHOS 47 12/27/2019     Other Imaging: CT HEAD WO CONTRAST  Result Date: 12/30/2019 CLINICAL DATA:  Altered mental status EXAM: CT HEAD WITHOUT CONTRAST TECHNIQUE: Contiguous axial images were obtained from the base of the skull through the vertex without intravenous contrast. COMPARISON:  09/21/2019 FINDINGS: Brain: No evidence of acute infarction, hemorrhage, hydrocephalus, extra-axial collection or mass lesion/mass effect. Insufflation changes related to old left frontal infarct. Old right thalamic infarct/wallerian degeneration. Suspected old left cerebellar infarct. Subcortical white matter and periventricular small vessel ischemic changes. Vascular: Intracranial atherosclerosis. Skull: Normal. Negative for fracture or focal lesion. Sinuses/Orbits: The visualized paranasal sinuses are essentially clear. The mastoid air cells are unopacified. Other: None. IMPRESSION: No evidence of acute intracranial abnormality. Old infarcts, as above. Electronically Signed   By: Charline Bills M.D.   On: 12/30/2019 15:40   CT Abdomen Pelvis W Contrast  Result Date: 12/26/2019 CLINICAL DATA:  Acute abdominal pain. Patient received enema 1 day ago EXAM: CT ABDOMEN AND PELVIS WITH CONTRAST TECHNIQUE: Multidetector CT imaging of the abdomen and pelvis was performed using the standard protocol following bolus administration of intravenous contrast. CONTRAST:  OMNIPAQUE IOHEXOL 300 MG/ML  SOLN COMPARISON:  CT 12/11/2019 FINDINGS: Lower chest: Mild dependent left basilar atelectasis. Lung bases otherwise clear. Hepatobiliary: Unremarkable appearance of the liver. No focal liver lesion is seen. There is a small amount of layering hyperdense material dependently within the gallbladder lumen which may represent a small amount of biliary sludge or small stones. No pericholecystic inflammatory changes by CT. No biliary dilatation. Pancreas: Unremarkable. No pancreatic ductal dilatation or surrounding inflammatory changes. Spleen: Normal in  size without focal abnormality. Adrenals/Urinary Tract: Unremarkable adrenal glands. 1.2 cm cyst within the interpolar region of the right kidney posteriorly. Kidneys appear otherwise within normal limits. No hydronephrosis. Unremarkable ureters. Urinary bladder within normal limits. Stomach/Bowel: Moderate volume of stool within the rectosigmoid colon, decreased from the prior exam. There is persistent circumferential colonic wall thickening and pericolonic fat stranding surrounding the rectum and distal sigmoid colon. Trace amount of fluid within  the presacral space. No adjacent organized fluid collection or evidence of perforation. Moderate to large volume of stool throughout the remaining colon. Circumferential perianal mucosal thickening is again seen. Stomach and small bowel are within normal limits. No dilated loops of small bowel. Vascular/Lymphatic: Scattered aortoiliac atherosclerotic calcifications without aneurysm. No abdominopelvic lymphadenopathy. Reproductive: Uterus and bilateral adnexa are unremarkable. Other: No pneumoperitoneum.  No abdominal wall hernia. Musculoskeletal: Similar degenerative changes thoracolumbar spine. No new or acute osseous findings. IMPRESSION: 1. Persistent circumferential colonic wall thickening and pericolonic fat stranding surrounding the rectum and distal sigmoid colon. No adjacent organized fluid collection or evidence of perforation. Findings are most suggestive of stercoral proctocolitis. 2. Volume of stool within the rectosigmoid colon has slightly decreased from prior. Moderate to large volume of stool throughout the remaining colon. 3. Small amount of layering hyperdense material within the gallbladder lumen which may represent a small amount of biliary sludge or small stones. No pericholecystic inflammatory changes by CT. 4. Aortic atherosclerosis. (ICD10-I70.0). Electronically Signed   By: Duanne Guess D.O.   On: 12/26/2019 13:40   CT ABDOMEN PELVIS W  CONTRAST  Result Date: 12/11/2019 CLINICAL DATA:  Abdominal pain fever and distension EXAM: CT ABDOMEN AND PELVIS WITH CONTRAST TECHNIQUE: Multidetector CT imaging of the abdomen and pelvis was performed using the standard protocol following bolus administration of intravenous contrast. CONTRAST:  OMNIPAQUE IOHEXOL 300 MG/ML  SOLN COMPARISON:  None. FINDINGS: Lower chest: The visualized heart size within normal limits. No pericardial fluid/thickening. No hiatal hernia. The visualized portions of the lungs are clear. Hepatobiliary: The liver is normal in density without focal abnormality.The main portal vein is patent. Layering calcified gallstones are present. Pancreas: Unremarkable. No pancreatic ductal dilatation or surrounding inflammatory changes. Spleen: Normal in size without focal abnormality. Adrenals/Urinary Tract: Both adrenal glands appear normal. Mild bilateral renal atrophy is seen. Bladder is unremarkable. Stomach/Bowel: The stomach and small bowel normal in appearance. There is a large amount of colonic stool seen throughout. Within the sigmoid rectal junction there is a large amount of stool with a rectal wall. Diffuse wall thickening is seen around the rectum with surrounding presacral fat stranding changes. There is also wall thickening seen around the anal canal. Vascular/Lymphatic: There are no enlarged mesenteric, retroperitoneal, or pelvic lymph nodes. Scattered aortic atherosclerotic calcifications are seen without aneurysmal dilatation. Reproductive: The uterus and adnexa are unremarkable. Other: No evidence of abdominal wall mass or hernia. Musculoskeletal: No acute or significant osseous findings. IMPRESSION: 1. Dilated sigmoid colon and rectum with a large amount of impacted with findings which could be suggestive of proctocolitis. However would recommend direct visualization upon resolution of symptoms to exclude underlying mass lesion. 2.  Aortic Atherosclerosis (ICD10-I70.0).  Electronically Signed   By: Jonna Clark M.D.   On: 12/11/2019 23:49   DG Chest Portable 1 View  Result Date: 12/12/2019 CLINICAL DATA:  Hypoxia EXAM: PORTABLE CHEST 1 VIEW COMPARISON:  03/08/2016 FINDINGS: Low lung volumes. Mild cardiomegaly. No consolidation or effusion. No pneumothorax. IMPRESSION: Low lung volumes with mild cardiomegaly. Electronically Signed   By: Jasmine Pang M.D.   On: 12/12/2019 00:02   CT Angio Abd/Pel W and/or Wo Contrast  Result Date: 12/27/2019 CLINICAL DATA:  GI bleeding. EXAM: CTA ABDOMEN AND PELVIS WITHOUT AND WITH CONTRAST TECHNIQUE: Multidetector CT imaging of the abdomen and pelvis was performed using the standard protocol during bolus administration of intravenous contrast. Multiplanar reconstructed images and MIPs were obtained and reviewed to evaluate the vascular anatomy. CONTRAST:  OMNIPAQUE  IOHEXOL 350 MG/ML SOLN COMPARISON:  CT abdomen pelvis 1 day prior FINDINGS: VASCULAR Aorta: Abdominal aorta normal caliber.  No dissection or aneurysm. Celiac: Celiac trunk is widely patent. SMA: SMA is widely patent.  No branch occlusion. Renals: Bilateral pain single renal arteries with ostial calcification. IMA: Widely patent Inflow: Normal Proximal Outflow: Normal Veins: Normal Review of the MIP images confirms the above findings. NON-VASCULAR Lower chest: Lung bases are Hepatobiliary: Normal liver.  Gallbladder mildly distended. Pancreas: Normal pancreatic parenchyma. Spleen: Spleen normal. Adrenals/Urinary Tract: Adrenal glands normal. Kidneys enhance symmetrically. No obstructive uropathy. Bladder contains excreted contrast from CT 1 day prior. Stomach/Bowel: Evaluation of the colon demonstrates no active arterial extravasation to localize gastrointestinal bleeding. Again demonstrated inflammation along the serosal surface of the distal rectum unchanged from prior. Moderate volume stool throughout the entirety of the colon. The stomach, duodenum small-bowel normal. No  pneumatosis or portal venous gas. Lymphatic: No lymphadenopathy Reproductive: Unremarkable Other: No intraperitoneal free air free fluid Musculoskeletal: No aggressive osseous lesion IMPRESSION: VASCULAR 1. No evidence of active gastrointestinal bleeding on CTA imaging. 2. Celiac trunk, SMA and IMA are widely patent. NON-VASCULAR 1. Inflammation along the serosal surface of the rectum not changed from day prior. Findings suggest proctitis. 2. No pneumatosis or evidence for ischemia. Electronically Signed   By: Genevive Bi M.D.   On: 12/27/2019 19:38     Assessment / Plan:   Molly [name] is a 74 y.o. No obstetric history on file. who presents with vaginal bleeding ( postmenopausal )   No gross bleed seen on exam today . Clinically not adding to her bleeding from GI . Vaginal u/s could be performed to look for endometrial thickening . She is at risk for EIN given her weight .if endometrial stripe is thickened she would be a candidate to perform a fractional d+c to assess tissue .( pt was too uncomfortable during exam today to attempt an endometrial bx )   Jennell Corner, MD   [facility]   Thank you for the opportunity to be involved with this pt's care.

## 2020-01-02 NOTE — Plan of Care (Signed)
  Problem: Education: Goal: Knowledge of General Education information will improve Description Including pain rating scale, medication(s)/side effects and non-pharmacologic comfort measures Outcome: Progressing   Problem: Health Behavior/Discharge Planning: Goal: Ability to manage health-related needs will improve Outcome: Progressing   

## 2020-01-02 NOTE — Progress Notes (Signed)
Pt found to have bleeding from vagina -- MD notified and rounded to bedside - pt  denies abd pain, cramps nausea  or any new conditions

## 2020-01-02 NOTE — Progress Notes (Signed)
Patient ID: Molly [name], female   DOB: [birth year], 74 y.o.   MRN: [id number]  Called to the bedside with bleeding episode.  Blleding looks like coming from the vaginal area.  Cancel discharge.  Gynecology consult.    Still holding blood thinners at this point.  Dr Alford Highland

## 2020-01-02 NOTE — Progress Notes (Signed)
Molly Minium, MD [facility]   [address] [place], [place] [address] Phone: [phone] Fax : [phone]   Subjective: The patient had an episode of diarrhea late this morning with a finding of significant vaginal bleeding.  The patient was reported to be asymptomatic with the vaginal bleeding.  She has been started on oral Vanco and is reported to be tolerating this well.   Objective: Vital signs in last 24 hours: Vitals:   01/01/20 0818 01/01/20 1629 01/02/20 0031 01/02/20 0743  BP: 99/70 124/76 138/66 123/72  Pulse: 100 (!) 101 97 (!) 101  Resp: 16 17 19 18   Temp: 98.7 F (37.1 C) 98.3 F (36.8 C) 98.7 F (37.1 C) 98.1 F (36.7 C)  TempSrc: Oral Oral Oral   SpO2: 100% 95% 96% 98%  Weight:      Height:       Weight change:   Intake/Output Summary (Last 24 hours) at 01/02/2020 1356 Last data filed at 01/02/2020 03/03/2020 Gross per 24 hour  Intake --  Output 1001 ml  Net -1001 ml     Exam: Heart:: Regular rate and rhythm, S1S2 present or without murmur or extra heart sounds Lungs: normal and clear to auscultation and percussion Abdomen: soft, nontender, normal bowel sounds   Lab Results: @LABTEST2 @ Micro Results: Recent Results (from the past 240 hour(s))  MRSA PCR Screening     Status: Abnormal   Collection Time: 12/28/19  7:30 PM   Specimen: Nasal Mucosa; Nasopharyngeal  Result Value Ref Range Status   MRSA by PCR POSITIVE (A) NEGATIVE Final    Comment:        The GeneXpert MRSA Assay (FDA approved for NASAL specimens only), is one component of a comprehensive MRSA colonization surveillance program. It is not intended to diagnose MRSA infection nor to guide or monitor treatment for MRSA infections. CRITICAL RESULT CALLED TO, READ BACK BY AND VERIFIED WITH: DESIREE DONSON @ [phone] 12/29/19 RH Performed at [facility], [address] [place], [address] [place]   Respiratory Panel by RT PCR (Flu A&B, Covid) - Nasal Mucosa     Status: None    Collection Time: 12/28/19  8:00 PM   Specimen: Nasal Mucosa; Nasopharyngeal  Result Value Ref Range Status   SARS Coronavirus 2 by RT PCR NEGATIVE NEGATIVE Final    Comment: (NOTE) SARS-CoV-2 target nucleic acids are NOT DETECTED.  The SARS-CoV-2 RNA is generally detectable in upper respiratoy specimens during the acute phase of infection. The lowest concentration of SARS-CoV-2 viral copies this assay can detect is 131 copies/mL. A negative result does not preclude SARS-Cov-2 infection and should not be used as the sole basis for treatment or other patient management decisions. A negative result may occur with  improper specimen collection/handling, submission of specimen other than nasopharyngeal swab, presence of viral mutation(s) within the areas targeted by this assay, and inadequate number of viral copies (<131 copies/mL). A negative result must be combined with clinical observations, patient history, and epidemiological information. The expected result is Negative.  Fact Sheet for Patients:  61950  Fact Sheet for Healthcare Providers:  12/30/19  This test is no t yet approved or cleared by the https://www.moore.com/ FDA and  has been authorized for detection and/or diagnosis of SARS-CoV-2 by FDA under an Emergency Use Authorization (EUA). This EUA will remain  in effect (meaning this test can be used) for the duration of the COVID-19 declaration under Section 564(b)(1) of the Act, 21 U.S.C. section 360bbb-3(b)(1), unless the  authorization is terminated or revoked sooner.     Influenza A by PCR NEGATIVE NEGATIVE Final   Influenza B by PCR NEGATIVE NEGATIVE Final    Comment: (NOTE) The Xpert Xpress SARS-CoV-2/FLU/RSV assay is intended as an aid in  the diagnosis of influenza from Nasopharyngeal swab specimens and  should not be used as a sole basis for treatment. Nasal washings and  aspirates are unacceptable  for Xpert Xpress SARS-CoV-2/FLU/RSV  testing.  Fact Sheet for Patients: https://www.moore.com/  Fact Sheet for Healthcare Providers: https://www.young.biz/  This test is not yet approved or cleared by the [place] FDA and  has been authorized for detection and/or diagnosis of SARS-CoV-2 by  FDA under an Emergency Use Authorization (EUA). This EUA will remain  in effect (meaning this test can be used) for the duration of the  Covid-19 declaration under Section 564(b)(1) of the Act, 21  U.S.C. section 360bbb-3(b)(1), unless the authorization is  terminated or revoked. Performed at [facility], [address] [place], [place] [address]   Respiratory Panel by RT PCR (Flu A&B, Covid) - Nasopharyngeal Swab     Status: None   Collection Time: 01/01/20  5:49 PM   Specimen: Nasopharyngeal Swab  Result Value Ref Range Status   SARS Coronavirus 2 by RT PCR NEGATIVE NEGATIVE Final    Comment: (NOTE) SARS-CoV-2 target nucleic acids are NOT DETECTED.  The SARS-CoV-2 RNA is generally detectable in upper respiratoy specimens during the acute phase of infection. The lowest concentration of SARS-CoV-2 viral copies this assay can detect is 131 copies/mL. A negative result does not preclude SARS-Cov-2 infection and should not be used as the sole basis for treatment or other patient management decisions. A negative result may occur with  improper specimen collection/handling, submission of specimen other than nasopharyngeal swab, presence of viral mutation(s) within the areas targeted by this assay, and inadequate number of viral copies (<131 copies/mL). A negative result must be combined with clinical observations, patient history, and epidemiological information. The expected result is Negative.  Fact Sheet for Patients:  https://www.moore.com/  Fact Sheet for Healthcare Providers:   https://www.young.biz/  This test is no t yet approved or cleared by the [place] FDA and  has been authorized for detection and/or diagnosis of SARS-CoV-2 by FDA under an Emergency Use Authorization (EUA). This EUA will remain  in effect (meaning this test can be used) for the duration of the COVID-19 declaration under Section 564(b)(1) of the Act, 21 U.S.C. section 360bbb-3(b)(1), unless the authorization is terminated or revoked sooner.     Influenza A by PCR NEGATIVE NEGATIVE Final   Influenza B by PCR NEGATIVE NEGATIVE Final    Comment: (NOTE) The Xpert Xpress SARS-CoV-2/FLU/RSV assay is intended as an aid in  the diagnosis of influenza from Nasopharyngeal swab specimens and  should not be used as a sole basis for treatment. Nasal washings and  aspirates are unacceptable for Xpert Xpress SARS-CoV-2/FLU/RSV  testing.  Fact Sheet for Patients: https://www.moore.com/  Fact Sheet for Healthcare Providers: https://www.young.biz/  This test is not yet approved or cleared by the [place] FDA and  has been authorized for detection and/or diagnosis of SARS-CoV-2 by  FDA under an Emergency Use Authorization (EUA). This EUA will remain  in effect (meaning this test can be used) for the duration of the  Covid-19 declaration under Section 564(b)(1) of the Act, 21  U.S.C. section 360bbb-3(b)(1), unless the authorization is  terminated or revoked. Performed at [facility], [address] [place], [place] [address]    Studies/Results: No results found. Medications: I have reviewed the patient's current medications. Scheduled Meds: . acidophilus  2 capsule Oral TID  . amitriptyline  75 mg Oral QHS  . insulin aspart  0-15 Units Subcutaneous Q4H  . mupirocin ointment  1 application Nasal BID  . sodium chloride flush  3 mL Intravenous Q12H  . traZODone  50 mg Oral QHS  . vancomycin  125 mg Oral QID    Followed by  . [START ON 01/15/2020] vancomycin  125 mg Oral BID   Followed by  . [START ON 01/23/2020] vancomycin  125 mg Oral Daily   Followed by  . [START ON 01/30/2020] vancomycin  125 mg Oral QODAY   Followed by  . [START ON 02/07/2020] vancomycin  125 mg Oral Q3 days   Continuous Infusions: PRN Meds:.acetaminophen **OR** acetaminophen, albuterol, ondansetron **OR** ondansetron (ZOFRAN) IV   Assessment: Principal Problem:   GI bleeding Active Problems:   Type 2 diabetes mellitus with hyperlipidemia (HCC)   COPD (chronic obstructive pulmonary disease) (HCC)   Essential hypertension   HLD (hyperlipidemia)   Acute cerebrovascular accident (CVA) (HCC)   Acute blood loss anemia   Decubitus ulcer of coccyx, stage 2 (HCC)   Lower GI bleed   Lethargy   Acute metabolic encephalopathy   Rectal bleeding   Weakness   Acute urinary retention   Vaginal bleeding    Plan: This patient has had waxing and waning diarrhea.  The patient is back on vancomycin by mouth and is now having problems with vaginal bleeding.  No endoscopic procedures are planned on this patient at the present time.  Hemoglobin stable at 12.2.  I will sign off.  Please call if any further GI concerns or questions.  We would like to thank you for the opportunity to participate in the care of Ardis Hughs.     LOS: 6 days   Sherlyn Hay 01/02/2020, 1:56 PM Pager [phone] 7am-5pm  Check AMION for 5pm -7am coverage and on weekends

## 2020-01-02 NOTE — Progress Notes (Signed)
Patient ID: Molly [name], female   DOB: 11/29/1945, 74 y.o.   MRN: [id number] [facility] PROGRESS NOTE  ORTENCIA ASKARI OTR:[id number] DOB: 06/17/1945 DOA: 12/27/2019 PCP: Ethelda Chick, MD  HPI/Subjective: Patient awake and alert.  Feels well.  No further diarrhea so far today.  No abdominal pain.  Objective: Vitals:   01/02/20 0031 01/02/20 0743  BP: 138/66 123/72  Pulse: 97 (!) 101  Resp: 19 18  Temp: 98.7 F (37.1 C) 98.1 F (36.7 C)  SpO2: 96% 98%    Intake/Output Summary (Last 24 hours) at 01/02/2020 1148 Last data filed at 01/02/2020 0642 Gross per 24 hour  Intake --  Output 1001 ml  Net -1001 ml   Filed Weights   12/27/19 0913 12/28/19 1817 12/29/19 0511  Weight: 94.9 kg 91.2 kg 91.2 kg    ROS: Review of Systems  Respiratory: Negative for cough and shortness of breath.   Cardiovascular: Negative for chest pain.  Gastrointestinal: Negative for abdominal pain, nausea and vomiting.   Exam: Physical Exam HENT:     Mouth/Throat:     Pharynx: No oropharyngeal exudate.  Eyes:     General: Lids are normal.     Conjunctiva/sclera: Conjunctivae normal.  Cardiovascular:     Rate and Rhythm: Normal rate and regular rhythm.     Heart sounds: Normal heart sounds, S1 normal and S2 normal.  Pulmonary:     Breath sounds: No decreased breath sounds, wheezing, rhonchi or rales.  Abdominal:     Palpations: Abdomen is soft.     Tenderness: There is no abdominal tenderness.  Musculoskeletal:     Right lower leg: No swelling.     Left lower leg: No swelling.  Skin:    General: Skin is warm.     Findings: No rash.  Neurological:     Mental Status: She is alert.     Comments: Answers all questions appropriately.  Left arm paralysis       Data Reviewed: Basic Metabolic Panel: Recent Labs  Lab 12/28/19 0439 12/29/19 0450 12/30/19 0332 01/01/20 0400 01/02/20 0355  NA 136 135 135 135 135  K 4.2 4.1 4.0 4.0 4.0  CL 98 96* 95* 95* 96*  CO2 25 30 30  29 29   GLUCOSE 112* 109* 110* 141* 140*  BUN 11 9 7* 9 9  CREATININE 0.64 0.44 0.61 0.68 0.69  CALCIUM 8.1* 8.7* 9.0 8.5* 8.8*  MG  --   --   --  2.3 2.3   Liver Function Tests: Recent Labs  Lab 12/27/19 1020  AST 40  ALT 35  ALKPHOS 47  BILITOT 1.0  PROT 6.4*  ALBUMIN 2.1*   CBC: Recent Labs  Lab 12/27/19 1020 12/27/19 1020 12/28/19 0013 12/28/19 0013 12/28/19 0439 12/28/19 1251 12/29/19 1214 12/30/19 0332 12/31/19 0750 01/01/20 0400 01/02/20 0355  WBC 8.7  --   --   --  9.0  --   --  6.5  --   --  10.7*  NEUTROABS  --   --   --   --   --   --   --   --   --   --  6.8  HGB 9.4*   < > 9.6*   < > 11.9*   < > 10.4* 10.3* 10.8* 11.3* 12.2  HCT 29.4*  --  30.4*  --  37.4  --   --  32.4*  --   --  37.1  MCV 89.4  --   --   --  94.0  --   --  92.3  --   --  89.8  PLT 386  --   --   --  318  --   --  312  --   --  302   < > = values in this interval not displayed.    CBG: Recent Labs  Lab 01/01/20 2011 01/02/20 0041 01/02/20 0355 01/02/20 0745 01/02/20 1124  GLUCAP 163* 125* 112* 176* 229*    Recent Results (from the past 240 hour(s))  MRSA PCR Screening     Status: Abnormal   Collection Time: 12/28/19  7:30 PM   Specimen: Nasal Mucosa; Nasopharyngeal  Result Value Ref Range Status   MRSA by PCR POSITIVE (A) NEGATIVE Final    Comment:        The GeneXpert MRSA Assay (FDA approved for NASAL specimens only), is one component of a comprehensive MRSA colonization surveillance program. It is not intended to diagnose MRSA infection nor to guide or monitor treatment for MRSA infections. CRITICAL RESULT CALLED TO, READ BACK BY AND VERIFIED WITH: DESIREE DONSON @ [phone] 12/29/19 RH Performed at [facility], [address] [place], [place] [address]   Respiratory Panel by RT PCR (Flu A&B, Covid) - Nasal Mucosa     Status: None   Collection Time: 12/28/19  8:00 PM   Specimen: Nasal Mucosa; Nasopharyngeal  Result Value Ref Range Status   SARS  Coronavirus 2 by RT PCR NEGATIVE NEGATIVE Final    Comment: (NOTE) SARS-CoV-2 target nucleic acids are NOT DETECTED.  The SARS-CoV-2 RNA is generally detectable in upper respiratoy specimens during the acute phase of infection. The lowest concentration of SARS-CoV-2 viral copies this assay can detect is 131 copies/mL. A negative result does not preclude SARS-Cov-2 infection and should not be used as the sole basis for treatment or other patient management decisions. A negative result may occur with  improper specimen collection/handling, submission of specimen other than nasopharyngeal swab, presence of viral mutation(s) within the areas targeted by this assay, and inadequate number of viral copies (<131 copies/mL). A negative result must be combined with clinical observations, patient history, and epidemiological information. The expected result is Negative.  Fact Sheet for Patients:  https://www.moore.com/  Fact Sheet for Healthcare Providers:  https://www.young.biz/  This test is no t yet approved or cleared by the [place] FDA and  has been authorized for detection and/or diagnosis of SARS-CoV-2 by FDA under an Emergency Use Authorization (EUA). This EUA will remain  in effect (meaning this test can be used) for the duration of the COVID-19 declaration under Section 564(b)(1) of the Act, 21 U.S.C. section 360bbb-3(b)(1), unless the authorization is terminated or revoked sooner.     Influenza A by PCR NEGATIVE NEGATIVE Final   Influenza B by PCR NEGATIVE NEGATIVE Final    Comment: (NOTE) The Xpert Xpress SARS-CoV-2/FLU/RSV assay is intended as an aid in  the diagnosis of influenza from Nasopharyngeal swab specimens and  should not be used as a sole basis for treatment. Nasal washings and  aspirates are unacceptable for Xpert Xpress SARS-CoV-2/FLU/RSV  testing.  Fact Sheet for  Patients: https://www.moore.com/  Fact Sheet for Healthcare Providers: https://www.young.biz/  This test is not yet approved or cleared by the [place] FDA and  has been authorized for detection and/or diagnosis of SARS-CoV-2 by  FDA under an Emergency Use Authorization (EUA). This EUA will remain  in effect (meaning this test can be used) for the duration of the  Covid-19 declaration under Section 564(b)(1) of the Act, 21  U.S.C. section 360bbb-3(b)(1), unless the authorization is  terminated or revoked. Performed at [facility], [address] [place], [place] [address]   Respiratory Panel by RT PCR (Flu A&B, Covid) - Nasopharyngeal Swab     Status: None   Collection Time: 01/01/20  5:49 PM   Specimen: Nasopharyngeal Swab  Result Value Ref Range Status   SARS Coronavirus 2 by RT PCR NEGATIVE NEGATIVE Final    Comment: (NOTE) SARS-CoV-2 target nucleic acids are NOT DETECTED.  The SARS-CoV-2 RNA is generally detectable in upper respiratoy specimens during the acute phase of infection. The lowest concentration of SARS-CoV-2 viral copies this assay can detect is 131 copies/mL. A negative result does not preclude SARS-Cov-2 infection and should not be used as the sole basis for treatment or other patient management decisions. A negative result may occur with  improper specimen collection/handling, submission of specimen other than nasopharyngeal swab, presence of viral mutation(s) within the areas targeted by this assay, and inadequate number of viral copies (<131 copies/mL). A negative result must be combined with clinical observations, patient history, and epidemiological information. The expected result is Negative.  Fact Sheet for Patients:  https://www.moore.com/  Fact Sheet for Healthcare Providers:  https://www.young.biz/  This test is no t yet approved or cleared by the [place] FDA and  has been authorized for detection and/or diagnosis of SARS-CoV-2 by FDA under an Emergency Use Authorization (EUA). This EUA will remain  in effect (meaning this test can be used) for the duration of the COVID-19 declaration under Section 564(b)(1) of the Act, 21 U.S.C. section 360bbb-3(b)(1), unless the authorization is terminated or revoked sooner.     Influenza A by PCR NEGATIVE NEGATIVE Final   Influenza B by PCR NEGATIVE NEGATIVE Final    Comment: (NOTE) The Xpert Xpress SARS-CoV-2/FLU/RSV assay is intended as an aid in  the diagnosis of influenza from Nasopharyngeal swab specimens and  should not be used as a sole basis for treatment. Nasal washings and  aspirates are unacceptable for Xpert Xpress SARS-CoV-2/FLU/RSV  testing.  Fact Sheet for Patients: https://www.moore.com/  Fact Sheet for Healthcare Providers: https://www.young.biz/  This test is not yet approved or cleared by the [place] FDA and  has been authorized for detection and/or diagnosis of SARS-CoV-2 by  FDA under an Emergency Use Authorization (EUA). This EUA will remain  in effect (meaning this test can be used) for the duration of the  Covid-19 declaration under Section 564(b)(1) of the Act, 21  U.S.C. section 360bbb-3(b)(1), unless the authorization is  terminated or revoked. Performed at [facility], [address] [place], [place] [address]       Scheduled Meds: . acidophilus  2 capsule Oral TID  . amitriptyline  75 mg Oral QHS  . insulin aspart  0-15 Units Subcutaneous Q4H  . mupirocin ointment  1 application Nasal BID  . sodium chloride flush  3 mL Intravenous Q12H  . traZODone  50 mg Oral QHS  . vancomycin  125 mg Oral QID   Followed by  . [START ON 01/15/2020] vancomycin  125 mg Oral BID   Followed by  . [START ON 01/23/2020] vancomycin  125 mg Oral Daily   Followed by  . [START ON 01/30/2020] vancomycin  125 mg Oral  QODAY   Followed by  . [START ON 02/07/2020] vancomycin  125 mg Oral Q3 days   Continuous Infusions:  Assessment/Plan:  1. Acute blood  loss anemia with lower GI bleed.  Hemoglobin up at 12.2.  Patient received 2 units of packed red blood cells and IV iron.  We will continue to hold Plavix.  Can hopefully go back on aspirin as outpatient.  Follow-up with GI as outpatient.  If any further bleeding stop aspirin. 2. C. difficile colitis.  Initially finished treatment course here.  Developed diarrhea yesterday again.  I started vancomycin back again we will give a tapering course 3. Acute metabolic encephalopathy.  Mental status has improved patient alert and awake and following commands. 4. Stage II coccyx decubitus ulcer present on admission.  See description below 5. Weakness physical therapy recommends rehab 6. History of stroke with left arm paralysis.  Continue to hold Plavix.  Restart aspirin.  If bleeds will have to stop aspirin. 7. Type 2 diabetes mellitus with hyperlipidemia can go back on Januvia and atorvastatin as outpatient.  Last hemoglobin A1c 7.3  Pressure Injury 12/28/19 Coccyx Left Stage 2 -  Partial thickness loss of dermis presenting as a shallow open injury with a red, pink wound bed without slough. (Active)  12/28/19 1845  Location: Coccyx  Location Orientation: Left  Staging: Stage 2 -  Partial thickness loss of dermis presenting as a shallow open injury with a red, pink wound bed without slough.  Wound Description (Comments):   Present on Admission: Yes       Code Status:     Code Status Orders  (From admission, onward)         Start     Ordered   12/27/19 2155  Full code  Continuous        12/27/19 2154        Code Status History    Date Active Date Inactive Code Status Order ID Comments User Context   12/12/2019 0246 12/18/2019 2104 Full Code [id number]  Hannah BeatMansy, Jan A, MD ED   04/27/2016 0857 04/30/2016 1830 Full Code [id number]  Milagros LollSudini, Srikar, MD ED    03/05/2016 1549 03/12/2016 1723 Full Code [id number]  Eugenie NorrieBlakeney, Dana G, NP ED   02/06/2016 1532 02/11/2016 1925 Full Code [id number]  Hower, Cletis Athensavid K, MD ED   02/01/2016 2117 02/05/2016 1359 Full Code [id number]  Auburn BilberryPatel, Shreyang, MD Inpatient   01/14/2016 1401 01/16/2016 2222 Full Code [id number]  Katha HammingKonidena, Snehalatha, MD ED   11/19/2014 2240 11/21/2014 2055 Full Code [id number]  Oralia ManisWillis, David, MD Inpatient   05/22/2014 1831 05/25/2014 2020 Full Code [id number]  Toni ArthursHewitt, John, MD Inpatient   05/21/2014 2231 05/22/2014 1831 Full Code [id number]  Yevonne PaxKhan, Saadat A, MD Inpatient   Advance Care Planning Activity     Family Communication: Spoke with husband on the phone Disposition Plan: Status is: Inpatient  Dispo: The patient is from: Rehab              Anticipated d/c is to: Rehab              Anticipated d/c date is: Today              Patient currently ready to go back to rehab  Time spent: 35 minutes  Jaspreet Hollings [employer]  [facility]

## 2020-01-03 LAB — GLUCOSE, CAPILLARY
Glucose-Capillary: 127 mg/dL — ABNORMAL HIGH (ref 70–99)
Glucose-Capillary: 147 mg/dL — ABNORMAL HIGH (ref 70–99)
Glucose-Capillary: 140 mg/dL — ABNORMAL HIGH (ref 70–99)
Glucose-Capillary: 188 mg/dL — ABNORMAL HIGH (ref 70–99)
Glucose-Capillary: 159 mg/dL — ABNORMAL HIGH (ref 70–99)
Glucose-Capillary: 176 mg/dL — ABNORMAL HIGH (ref 70–99)

## 2020-01-03 LAB — CBC
HCT: 33.5 % — ABNORMAL LOW (ref 36.0–46.0)
Hemoglobin: 10.7 g/dL — ABNORMAL LOW (ref 12.0–15.0)
MCH: 29.4 pg (ref 26.0–34.0)
MCHC: 31.9 g/dL (ref 30.0–36.0)
MCV: 92 fL (ref 80.0–100.0)
Platelets: 300 10*3/uL (ref 150–400)
RBC: 3.64 MIL/uL — ABNORMAL LOW (ref 3.87–5.11)
RDW: 17.2 % — ABNORMAL HIGH (ref 11.5–15.5)
WBC: 9.2 10*3/uL (ref 4.0–10.5)
nRBC: 0 % (ref 0.0–0.2)

## 2020-01-03 LAB — BASIC METABOLIC PANEL
Anion gap: 8 (ref 5–15)
BUN: 11 mg/dL (ref 8–23)
CO2: 29 mmol/L (ref 22–32)
Calcium: 8.9 mg/dL (ref 8.9–10.3)
Chloride: 97 mmol/L — ABNORMAL LOW (ref 98–111)
Creatinine, Ser: 0.63 mg/dL (ref 0.44–1.00)
GFR calc non Af Amer: >60 mL/min (ref >60)
Glucose, Bld: 157 mg/dL — ABNORMAL HIGH (ref 70–99)
Potassium: 4.3 mmol/L (ref 3.5–5.1)
Sodium: 134 mmol/L — ABNORMAL LOW (ref 135–145)

## 2020-01-03 NOTE — Plan of Care (Signed)
  Problem: Education: Goal: Knowledge of General Education information will improve Description: Including pain rating scale, medication(s)/side effects and non-pharmacologic comfort measures Outcome: Progressing   Problem: Health Behavior/Discharge Planning: Goal: Ability to manage health-related needs will improve Outcome: Progressing   Problem: Clinical Measurements: Goal: Ability to maintain clinical measurements within normal limits will improve Outcome: Progressing Goal: Will remain free from infection Outcome: Progressing Goal: Diagnostic test results will improve Outcome: Progressing Goal: Respiratory complications will improve Outcome: Progressing Goal: Cardiovascular complication will be avoided Outcome: Progressing  Pure wick removed - no further bleeding noted today.  Pt continues to have liquid  stools

## 2020-01-03 NOTE — Progress Notes (Signed)
Physical Therapy Treatment Patient Details Name: Molly [name] MRN: [id number] DOB: December 01, 1945 Today's Date: 01/03/2020    History of Present Illness Molly [name] is a 74 y.o. female with Past medical history of CVA with residual left-sided weakness, seizures, L toe amputation, HTN, HLD, IDDM, COPD, anxiety, recent fall recent history of C. Difficile (still being treated), presented at [facility] ED from rehab due to lower GI bleeding. Last admission approximately 2 weeks ago. Patient was seen in the ED 12/28/2019 due to GI bleeding, CT scan was done which showed  stercoral Proctocolitis, Hb was 10.3 no significant drop so patient was discharged back to rehab.  12/27/19 again patient had significant lower GI bleeding, dark-colored stool so patient was sent back to the ED for work-up. Is still being treated for C. Diff. Admitted for GI bleed as principle problem. GI consult suggested colonoscopy in hospital or outpatient depending on Plavix.    PT Comments    Patient presents in fowler's position on arrival to room. She is agreeable to do some supine exercises in bed and rolling. Patient able to roll on L side with ModA+1 as she is able to pull with RUE on bedrail. Attempted to sit EOB on L side but unable with one person. When rolling to the R side, she is MaxA+1 as she is unable to use LUE for support. She is able to get some relief off her back with rolling, however not able to hold position for more than 5 seconds. Repositioned patient so she's more in the middle of the bed and put a pillow underneath her L arm. Patient left in bed with all needs. Pt will benefit from skilled PT services to address deficits in strength, balance, and return to PLOF or maximal functional independence.   Follow Up Recommendations  SNF     Equipment Recommendations  Rolling walker with 5" wheels;Standard walker    Recommendations for Other Services       Precautions / Restrictions Precautions Precautions:  Fall Restrictions Weight Bearing Restrictions: No Other Position/Activity Restrictions: Residual L sided weakness    Mobility  Bed Mobility Overal bed mobility: Needs Assistance Bed Mobility: Rolling Rolling: Max assist;Mod assist         General bed mobility comments: Pt is able to roll onto L side with mod assist and cueing. She used her RUE to pull on bedrail. When rolling onto her R side, patient is max A with decreased functional use of LUE. Attempted sitting EOB but needed an extra person for assistance.  Transfers                 General transfer comment: Did not perform due to pt unsafe/unable  Ambulation/Gait             General Gait Details: Did not perform due to pt unsafe/ unable   Stairs             Wheelchair Mobility    Modified Rankin (Stroke Patients Only)       Balance       Sitting balance - Comments: Did not assess this session       Standing balance comment: Did not assess                            Cognition Arousal/Alertness: Awake/alert Behavior During Therapy: Flat affect Overall Cognitive Status: No family/caregiver present to determine baseline cognitive functioning  General Comments: Patient attempted to call her husband on the hospital phone 3 times. Everytime would say a different phone number to dial.      Exercises General Exercises - Upper Extremity Elbow Flexion: AROM;5 reps;Right;Supine (PROM of LUE) Elbow Extension: AROM;Right;5 reps;Supine (PROM of LUE) General Exercises - Lower Extremity Ankle Circles/Pumps: AROM;Both;5 reps;Supine Heel Slides: AAROM;Both;5 reps;Supine Straight Leg Raises: AAROM;Both;5 reps;Supine    General Comments General comments (skin integrity, edema, etc.): Patient's LUE swollen has decreased swelling      Pertinent Vitals/Pain Pain Assessment: No/denies pain    Home Living                      Prior  Function            PT Goals (current goals can now be found in the care plan section) Acute Rehab PT Goals Patient Stated Goal: To go home PT Goal Formulation: Patient unable to participate in goal setting Time For Goal Achievement: 01/13/20 Potential to Achieve Goals: Poor Progress towards PT goals: Progressing toward goals    Frequency    Min 2X/week      PT Plan Current plan remains appropriate    Co-evaluation              AM-PAC PT "6 Clicks" Mobility   Outcome Measure  Help needed turning from your back to your side while in a flat bed without using bedrails?: A Lot Help needed moving from lying on your back to sitting on the side of a flat bed without using bedrails?: A Lot Help needed moving to and from a bed to a chair (including a wheelchair)?: Total Help needed standing up from a chair using your arms (e.g., wheelchair or bedside chair)?: Total Help needed to walk in hospital room?: Total Help needed climbing 3-5 steps with a railing? : Total 6 Click Score: 8    End of Session   Activity Tolerance: Patient limited by lethargy Patient left: in bed;with call bell/phone within reach;with bed alarm set Nurse Communication: Mobility status PT Visit Diagnosis: Unsteadiness on feet (R26.81);Other abnormalities of gait and mobility (R26.89);Muscle weakness (generalized) (M62.81);Difficulty in walking, not elsewhere classified (R26.2)     Time: 1761-6073 PT Time Calculation (min) (ACUTE ONLY): 23 min  Charges:                         Katherine Basset, SPT Baker Pierini 01/03/2020, 12:48 PM

## 2020-01-03 NOTE — Progress Notes (Addendum)
[facility]  - [place] [facility]   PATIENT NAME: Molly [name]    MR#:  [id number]  DATE OF BIRTH:  [birth year]  SUBJECTIVE:   Awake alert. Some periods of confusion. Speech mildly garbled. No vaginally bleeding or diarrhea. Pink tinge urine in the canister. REVIEW OF SYSTEMS:   Review of Systems  Constitutional: Negative for chills, fever and weight loss.  HENT: Negative for ear discharge, ear pain and nosebleeds.   Eyes: Negative for blurred vision, pain and discharge.  Respiratory: Negative for sputum production, shortness of breath, wheezing and stridor.   Cardiovascular: Negative for chest pain, palpitations, orthopnea and PND.  Gastrointestinal: Negative for abdominal pain, diarrhea, nausea and vomiting.  Genitourinary: Negative for frequency and urgency.  Musculoskeletal: Negative for back pain and joint pain.  Neurological: Positive for weakness. Negative for sensory change, speech change and focal weakness.  Psychiatric/Behavioral: Negative for depression and hallucinations. The patient is not nervous/anxious.    Tolerating Diet:yes Tolerating PT:  passive PT  DRUG ALLERGIES:  No Known Allergies  VITALS:  Blood pressure 109/67, pulse 98, temperature 97.8 F (36.6 C), temperature source Oral, resp. rate 15, height 5\' 2"  (1.575 m), weight 91.2 kg, SpO2 100 %.  PHYSICAL EXAMINATION:   Physical Exam  GENERAL:  74 y.o.-year-old patient lying in the bed with no acute distress.  HEENT: Head atraumatic, normocephalic. Oropharynx and nasopharynx clear.  NECK:  Supple, no jugular venous distention. No thyroid enlargement, no tenderness.  LUNGS: Normal breath sounds bilaterally, no wheezing, rales, rhonchi. No use of accessory muscles of respiration.  CARDIOVASCULAR: S1, S2 normal. No murmurs, rubs, or gallops.  ABDOMEN: Soft, nontender, nondistended. Bowel sounds present. No organomegaly or mass.  EXTREMITIES: No cyanosis, clubbing or edema b/l.     NEUROLOGIC: mild dysarthria. Left chronic upper and lower extremity hemiparesis. Medial nerves grossly intact  PSYCHIATRIC:  patient is alert and awake SKIN: Pressure Injury 12/28/19 Coccyx Left Stage 2 -  Partial thickness loss of dermis presenting as a shallow open injury with a red, pink wound bed without slough. (Active)  12/28/19 1845  Location: Coccyx  Location Orientation: Left  Staging: Stage 2 -  Partial thickness loss of dermis presenting as a shallow open injury with a red, pink wound bed without slough.  Wound Description (Comments):   Present on Admission: Yes        LABORATORY PANEL:  CBC Recent Labs  Lab 01/03/20 0333  WBC 9.2  HGB 10.7*  HCT 33.5*  PLT 300    Chemistries  Recent Labs  Lab 01/02/20 0355 01/02/20 0355 01/03/20 0333  NA 135   < > 134*  K 4.0   < > 4.3  CL 96*   < > 97*  CO2 29   < > 29  GLUCOSE 140*   < > 157*  BUN 9   < > 11  CREATININE 0.69   < > 0.63  CALCIUM 8.8*   < > 8.9  MG 2.3  --   --    < > = values in this interval not displayed.   Cardiac Enzymes No results for input(s): TROPONINI in the last 168 hours. RADIOLOGY:  03/04/20 PELVIC COMPLETE WITH TRANSVAGINAL  Result Date: 01/02/2020 CLINICAL DATA:  Initial evaluation for postmenopausal bleeding. EXAM: TRANSABDOMINAL AND TRANSVAGINAL ULTRASOUND OF PELVIS TECHNIQUE: Both transabdominal and transvaginal ultrasound examinations of the pelvis were performed. Transabdominal technique was performed for global imaging of the pelvis including uterus, ovaries, adnexal regions, and pelvic cul-de-sac. It  was necessary to proceed with endovaginal exam following the transabdominal exam to visualize the uterus, endometrium, and ovaries. COMPARISON:  None available. FINDINGS: Uterus Measurements: 5.3 x 2.8 x 5.2 cm = volume: 40.4 mL. No fibroids or other mass visualized. Endometrium Thickness: 3.8 mm. There is a focal echogenic lesion/area within the endometrial complex measuring 1.2 x 1.0 x 1.3  cm (image 51). Suggestion of an associated vascular stalk (image 48). Elsewhere, the endometrial stripe measures up to 3.8 mm in thickness. Right ovary Not visualized.  No adnexal mass. Left ovary Not visualized.  No adnexal mass. Other findings No abnormal free fluid. IMPRESSION: 1. 1.2 x 1.0 x 1.3 cm echogenic lesion within the endometrial complex as above, indeterminate. Primary differential considerations include an endometrial polyp, focal endometrial hyperplasia, or possibly endometrial carcinoma. In the setting of post-menopausal bleeding, endometrial sampling is indicated to exclude carcinoma. If results are benign, sonohysterogram should be considered for focal lesion work-up prior to hysteroscopy. (Ref: Radiological Reasoning: Algorithmic Workup of Abnormal Vaginal Bleeding with Endovaginal Sonography and Sonohysterography. AJR 2008; 161:W96-04) 2. Otherwise normal sonographic appearance of the uterus. 3. Nonvisualization of either ovary. No adnexal mass or free fluid within the pelvis. Electronically Signed   By: Rise Mu M.D.   On: 01/02/2020 19:10   ASSESSMENT AND PLAN:  [name] is a 74 y.o. female with Past medical history of CVA with residual left-sided weakness, HTN, HLD, IDDM, COPD, anxiety, history of C. Difficile, presented at [facility] ED due to lower GI bleeding.   # GI bleeding, most likely lower GI bleeding due to stercoral proctocolitis -patient received two units of Paxil during hospital course. Hemoglobin now is stabilized. -It was 12.2.-- Today 10.3. She did have? Vaginal bleeding -G.I. signed off -currently on vancomycin tapering dose for C. diff colitis ,no more diarrhea reported by staff. -Resume aspirin -holding of Plavix at this point. Can be resumed as outpatient safely by PCP  #? Vaginal bleeding -gynecology consultation was obtained with Dr. Feliberto Gottron -ultrasound pelvis showed endometrial strip normal. Small polyp. No further workup needed at  present. Poor gynecologist can follow-up as outpatient. -No vaginally bleeding per RN today  #C. diff colitis -patient finished her treatment how were developed diarrhea and was started on vancomycin taper by Dr. Renae Gloss -no diarrhea. Will d/w ID to see if she needs a long taper vs short course  # acute metabolic encephalopathy-- she is awake and alert. -ABG did not show CO2 retention -overnight pulse oximetry was stable -etiology unclear -fever  #Type II diabetes with hyperlipidemia -will resume Januvia and atorvastatin as outpatient continue sliding scale insulin -last A1c 7.3 -continue carb control diet  #History of stroke with chronic left arm paralysis -continue to hold Plavix -aspirin resumed -continue statin -continue PT OT   #Stage II coccyx decubitus present on admission.  Partial-thickness, shallow open injury with red-pink wound bed without slough.  Left coccyx    Procedures:none Family communication : spoke with Mr. Notarianni on the phone consults : CODE STATUS: G.I., gynecology DVT Prophylaxis : SCD  Status is: Inpatient  Remains inpatient appropriate because:Monitor one more day for questionable vaginally bleeding   Dispo: The patient is from: SNF              Anticipated d/c is to: SNF              Anticipated d/c date is: 1 day              Patient currently is medically stable  to d/c. however would like to monitor one more day for any rectal, vaginally or urethral bleeding since hemoglobin dipped down a little bit.  Patient will discharged back to [facility] tomorrow. TOC aware. Patient's husband is in agreement with plan.        TOTAL TIME TAKING CARE OF THIS PATIENT: *25* minutes.  >50% time spent on counselling and coordination of care  Note: This dictation was prepared with Dragon dictation along with smaller phrase technology. Any transcriptional errors that result from this process are unintentional.  Enedina Finner M.D    [facility]   CC: Primary care physician; Ethelda Chick, MDPatient ID: Ardis Hughs, female   DOB: [birth year], 74 y.o.   MRN: [id number]

## 2020-01-04 DIAGNOSIS — E1159 Type 2 diabetes mellitus with other circulatory complications: Secondary | ICD-10-CM

## 2020-01-04 DIAGNOSIS — I152 Hypertension secondary to endocrine disorders: Secondary | ICD-10-CM

## 2020-01-04 DIAGNOSIS — E669 Obesity, unspecified: Secondary | ICD-10-CM

## 2020-01-04 LAB — GLUCOSE, CAPILLARY
Glucose-Capillary: 133 mg/dL — ABNORMAL HIGH (ref 70–99)
Glucose-Capillary: 148 mg/dL — ABNORMAL HIGH (ref 70–99)
Glucose-Capillary: 126 mg/dL — ABNORMAL HIGH (ref 70–99)
Glucose-Capillary: 186 mg/dL — ABNORMAL HIGH (ref 70–99)

## 2020-01-04 LAB — SARS CORONAVIRUS 2 BY RT PCR (HOSPITAL ORDER, PERFORMED IN ~~LOC~~ HOSPITAL LAB): SARS Coronavirus 2: NEGATIVE

## 2020-01-04 MED ORDER — VANCOMYCIN HCL 125 MG PO CAPS: 125.0000 mg | ORAL_CAPSULE | Freq: Four times a day (QID) | ORAL | Status: AC

## 2020-01-04 NOTE — Plan of Care (Signed)
  Problem: Education: Goal: Knowledge of General Education information will improve Description: Including pain rating scale, medication(s)/side effects and non-pharmacologic comfort measures Outcome: Adequate for Discharge   Problem: Health Behavior/Discharge Planning: Goal: Ability to manage health-related needs will improve Outcome: Adequate for Discharge   Problem: Activity: Goal: Risk for activity intolerance will decrease Outcome: Adequate for Discharge   Problem: Nutrition: Goal: Adequate nutrition will be maintained Outcome: Adequate for Discharge   Problem: Coping: Goal: Level of anxiety will decrease Outcome: Adequate for Discharge

## 2020-01-04 NOTE — Discharge Summary (Signed)
[facility] [place] [facility]   PATIENT NAME: Molly [name]    MR#:  [id number]  DATE OF BIRTH:  [birth year]  DATE OF ADMISSION:  12/27/2019 ADMITTING PHYSICIAN: Gillis Santa, MD  DATE OF DISCHARGE: 01/04/2020  PRIMARY CARE PHYSICIAN: Ethelda Chick, MD    ADMISSION DIAGNOSIS:  GI bleeding [K92.2] Rectal bleeding [K62.5] GI bleed [K92.2]  DISCHARGE DIAGNOSIS:  Rectal bleeding--resolved C diff colitis (ongoing treatment from last admission) Endometrial polyp on US pelvis--f/u GYN as out pt  SECONDARY DIAGNOSIS:   Past Medical History:  Diagnosis Date  . Anxiety   . COPD (chronic obstructive pulmonary disease) (HCC)   . Diabetes (HCC)   . Hemorrhoid   . HLD (hyperlipidemia)   . HTN (hypertension)   . Stroke [facility]) 2008   left weakness    HOSPITAL COURSE:   EQUILLA QUE a 74 y.o.femalewith Past medical history ofCVA with residual left-sided weakness, HTN, HLD, IDDM, COPD, anxiety, history of C. Difficile,presented at [facility] ED due to lower GI bleeding.   # GI bleeding,most likely lower GI bleeding due tostercoral proctocolitis -patient received two units of Paxil during hospital course. Hemoglobin now is stabilized. -It was 12.2.-- Today 10.3. She did have? Vaginal bleeding--now resolved -G.I. signed off -currently on vancomycin for C. diff colitis ,no more diarrhea reported by staff.I have placed her back on her previous rx regimen from last admission--pt did not get a chance to finish it -Resume aspirin -holding of Plavix at this point. Can be resumed as outpatient safely by PCP  #? Vaginal bleeding -gynecology consultation was obtained with Dr. Feliberto Gottron -ultrasound pelvis showed endometrial strip normal. Small polyp. No further workup needed at present. Per gynecologist can follow-up as outpatient. -No vaginally bleeding per RN today  #C. diff colitis -d/w pharmacist yday who found out from husband that pt had not  finished her previuos rx from last admission. Therefore will resume 10 day treatment -no diarrhea.  # acute metabolic encephalopathy-- she is sleepy this am but overall improved mentation -ABG did not show CO2 retention -overnight pulse oximetry was stable  #Type II diabetes with hyperlipidemia -will resume Januvia and atorvastatin as outpatient continue sliding scale insulin -last A1c 7.3 -continue carb control diet  #History of stroke with chronic left arm paralysis -continue to hold Plavix -aspirin resumed -continue statin -continue PT OT   #Stage II coccyx decubitus present on admission. Partial-thickness, shallow open injury with red-pink wound bed without slough. Left coccyx -foam pad dressing per RN  Body mass index is 36.76 kg/m. Morbid obesity    Procedures:none Family communication : spoke with Mr. Humphres on the phone  consults :G.I., gynecology CODE STATUS: FULL DVT Prophylaxis : SCD  Status is: Inpatient  Dispo: The patient is from: SNF  Anticipated d/c is to: SNF--[facility]  Anticipated d/c date is: today  Patient currently is medically stable to d/c. no more diarrhea or rectal/vaginal bleeding Patient will [facility] back to [facility] today. TOC aware. Patient's husband is in agreement with plan--spoke with him this am    CONSULTS OBTAINED:  Treatment Team:  Schermerhorn, Ihor Austin, MD  DRUG ALLERGIES:  No Known Allergies  DISCHARGE MEDICATIONS:   Allergies as of 01/04/2020   No Known Allergies     Medication List    STOP taking these medications   clopidogrel 75 MG tablet Commonly known as: PLAVIX   docusate sodium 100 MG capsule Commonly known as: Colace   insulin aspart 100 UNIT/ML injection Commonly known as:  novoLOG   magnesium oxide 400 MG tablet Commonly known as: MAG-OX   polyethylene glycol 17 g packet Commonly known as: MIRALAX / GLYCOLAX   senna-docusate 8.6-50 MG  tablet Commonly known as: Senokot-S     TAKE these medications   acetaminophen 325 MG tablet Commonly known as: TYLENOL Take 650 mg by mouth every 6 (six) hours as needed.   albuterol 108 (90 Base) MCG/ACT inhaler Commonly known as: VENTOLIN HFA ALBUTEROL 90 MCG/ACT AERS   amitriptyline 75 MG tablet Commonly known as: ELAVIL Take 75 mg by mouth at bedtime.   aspirin EC 81 MG tablet Take 81 mg by mouth daily.   atorvastatin 20 MG tablet Commonly known as: LIPITOR Take 20 mg by mouth daily at 6 PM.   diclofenac sodium 1 % Gel Commonly known as: VOLTAREN Apply 2 g topically 4 (four) times daily. To each shoulders   hydrocortisone 25 MG suppository Commonly known as: ANUSOL-HC Place 1 suppository (25 mg total) rectally every 12 (twelve) hours.   mupirocin ointment 2 % Commonly known as: BACTROBAN Place 1 application into the nose 2 (two) times daily.   sitaGLIPtin 100 MG tablet Commonly known as: JANUVIA Take 100 mg by mouth daily.   SUPER B COMPLEX PO Take 1 tablet by mouth daily.   traZODone 50 MG tablet Commonly known as: DESYREL Take 50 mg by mouth at bedtime.   vancomycin 125 MG capsule Commonly known as: Vancocin HCl Take 1 capsule (125 mg total) by mouth 4 (four) times daily for 8 days.   Vitamin D3 25 MCG (1000 UT) Caps Take 1,000 Units by mouth daily.     ASK your doctor about these medications   acidophilus Caps capsule Take 2 capsules by mouth 3 (three) times daily for 15 days. Ask about: Should I take this medication?            Discharge Care Instructions  (From admission, onward)         Start     Ordered   01/04/20 0000  Discharge wound care:       Comments: Apply foam pad dressing daily   01/04/20 0851          If you experience worsening of your admission symptoms, develop shortness of breath, life threatening emergency, suicidal or homicidal thoughts you must seek medical attention immediately by calling 911 or calling your  MD immediately  if symptoms less severe.  You Must read complete instructions/literature along with all the possible adverse reactions/side effects for all the Medicines you take and that have been prescribed to you. Take any new Medicines after you have completely understood and accept all the possible adverse reactions/side effects.   Please note  You were cared for by a hospitalist during your hospital stay. If you have any questions about your discharge medications or the care you received while you were in the hospital after you are discharged, you can call the unit and asked to speak with the hospitalist on call if the hospitalist that took care of you is not available. Once you are discharged, your primary care physician will handle any further medical issues. Please note that NO REFILLS for any discharge medications will be authorized once you are discharged, as it is imperative that you return to your primary care physician (or establish a relationship with a primary care physician if you do not have one) for your aftercare needs so that they can reassess your need for medications and monitor your lab  values. Today   SUBJECTIVE    Sleepy today Responds to verbal commands No new issues per RN. No bleeding or freq diarrhea reported VITAL SIGNS:  Blood pressure 137/79, pulse 97, temperature 98.6 F (37 C), temperature source Oral, resp. rate 18, height  (1.575 m), weight 91.2 kg, SpO2 98 %.  I/O:    Intake/Output Summary (Last 24 hours) at 01/04/2020 0852 Last data filed at 01/03/2020 1300 Gross per 24 hour  Intake --  Output 400 ml  Net -400 ml    PHYSICAL EXAMINATION:   GENERAL:  74 y.o.-year-old patient lying in the bed with no acute distress. Obese HEENT: Head atraumatic, normocephalic. Oropharynx and nasopharynx clear.  LUNGS: Normal breath sounds bilaterally, no wheezing, rales, rhonchi. No use of accessory muscles of respiration.  CARDIOVASCULAR: S1, S2 normal. No  murmurs, rubs, or gallops.  ABDOMEN: Soft, nontender, nondistended. Bowel sounds present. No organomegaly or mass.  EXTREMITIES: No cyanosis, clubbing or edema b/l.    NEUROLOGIC: mild dysarthria. Left chronic upper and lower extremity hemiparesis. cranial nerves grossly intact  PSYCHIATRIC:  patient is alert and awake SKIN: Pressure Injury 12/28/19 Coccyx Left Stage 2 -  Partial thickness loss of dermis presenting as a shallow open injury with a red, pink wound bed without slough. (Active)  12/28/19 1845  Location: Coccyx  Location Orientation: Left  Staging: Stage 2 -  Partial thickness loss of dermis presenting as a shallow open injury with a red, pink wound bed without slough.  Wound Description (Comments):   Present on Admission: Yes    DATA REVIEW:   CBC  Recent Labs  Lab 01/03/20 0333  WBC 9.2  HGB 10.7*  HCT 33.5*  PLT 300    Chemistries  Recent Labs  Lab 01/02/20 0355 01/02/20 0355 01/03/20 0333  NA 135   < > 134*  K 4.0   < > 4.3  CL 96*   < > 97*  CO2 29   < > 29  GLUCOSE 140*   < > 157*  BUN 9   < > 11  CREATININE 0.69   < > 0.63  CALCIUM 8.8*   < > 8.9  MG 2.3  --   --    < > = values in this interval not displayed.    Microbiology Results   Recent Results (from the past 240 hour(s))  MRSA PCR Screening     Status: Abnormal   Collection Time: 12/28/19  7:30 PM   Specimen: Nasal Mucosa; Nasopharyngeal  Result Value Ref Range Status   MRSA by PCR POSITIVE (A) NEGATIVE Final    Comment:        The GeneXpert MRSA Assay (FDA approved for NASAL specimens only), is one component of a comprehensive MRSA colonization surveillance program. It is not intended to diagnose MRSA infection nor to guide or monitor treatment for MRSA infections. CRITICAL RESULT CALLED TO, READ BACK BY AND VERIFIED WITH: DESIREE DONSON @ [phone] 12/29/19 RH Performed at [facility], [address], [place], [place] [address]   Respiratory Panel by RT PCR (Flu A&B,  Covid) - Nasal Mucosa     Status: None   Collection Time: 12/28/19  8:00 PM   Specimen: Nasal Mucosa; Nasopharyngeal  Result Value Ref Range Status   SARS Coronavirus 2 by RT PCR NEGATIVE NEGATIVE Final    Comment: (NOTE) SARS-CoV-2 target nucleic acids are NOT DETECTED.  The SARS-CoV-2 RNA is generally detectable in upper respiratoy specimens during the acute phase of  infection. The lowest concentration of SARS-CoV-2 viral copies this assay can detect is 131 copies/mL. A negative result does not preclude SARS-Cov-2 infection and should not be used as the sole basis for treatment or other patient management decisions. A negative result may occur with  improper specimen collection/handling, submission of specimen other than nasopharyngeal swab, presence of viral mutation(s) within the areas targeted by this assay, and inadequate number of viral copies (<131 copies/mL). A negative result must be combined with clinical observations, patient history, and epidemiological information. The expected result is Negative.  Fact Sheet for Patients:  https://www.moore.com/  Fact Sheet for Healthcare Providers:  https://www.young.biz/  This test is no t yet approved or cleared by the [place] FDA and  has been authorized for detection and/or diagnosis of SARS-CoV-2 by FDA under an Emergency Use Authorization (EUA). This EUA will remain  in effect (meaning this test can be used) for the duration of the COVID-19 declaration under Section 564(b)(1) of the Act, 21 U.S.C. section 360bbb-3(b)(1), unless the authorization is terminated or revoked sooner.     Influenza A by PCR NEGATIVE NEGATIVE Final   Influenza B by PCR NEGATIVE NEGATIVE Final    Comment: (NOTE) The Xpert Xpress SARS-CoV-2/FLU/RSV assay is intended as an aid in  the diagnosis of influenza from Nasopharyngeal swab specimens and  should not be used as a sole basis for treatment. Nasal  washings and  aspirates are unacceptable for Xpert Xpress SARS-CoV-2/FLU/RSV  testing.  Fact Sheet for Patients: https://www.moore.com/  Fact Sheet for Healthcare Providers: https://www.young.biz/  This test is not yet approved or cleared by the [place] FDA and  has been authorized for detection and/or diagnosis of SARS-CoV-2 by  FDA under an Emergency Use Authorization (EUA). This EUA will remain  in effect (meaning this test can be used) for the duration of the  Covid-19 declaration under Section 564(b)(1) of the Act, 21  U.S.C. section 360bbb-3(b)(1), unless the authorization is  terminated or revoked. Performed at [facility], [address] [place], [place] [address]   Respiratory Panel by RT PCR (Flu A&B, Covid) - Nasopharyngeal Swab     Status: None   Collection Time: 01/01/20  5:49 PM   Specimen: Nasopharyngeal Swab  Result Value Ref Range Status   SARS Coronavirus 2 by RT PCR NEGATIVE NEGATIVE Final    Comment: (NOTE) SARS-CoV-2 target nucleic acids are NOT DETECTED.  The SARS-CoV-2 RNA is generally detectable in upper respiratoy specimens during the acute phase of infection. The lowest concentration of SARS-CoV-2 viral copies this assay can detect is 131 copies/mL. A negative result does not preclude SARS-Cov-2 infection and should not be used as the sole basis for treatment or other patient management decisions. A negative result may occur with  improper specimen collection/handling, submission of specimen other than nasopharyngeal swab, presence of viral mutation(s) within the areas targeted by this assay, and inadequate number of viral copies (<131 copies/mL). A negative result must be combined with clinical observations, patient history, and epidemiological information. The expected result is Negative.  Fact Sheet for Patients:  https://www.moore.com/  Fact Sheet for Healthcare  Providers:  https://www.young.biz/  This test is no t yet approved or cleared by the [place] FDA and  has been authorized for detection and/or diagnosis of SARS-CoV-2 by FDA under an Emergency Use Authorization (EUA). This EUA will remain  in effect (meaning this test can be used) for the duration of the COVID-19 declaration under Section 564(b)(1) of the Act, 21 U.S.C.  section 360bbb-3(b)(1), unless the authorization is terminated or revoked sooner.     Influenza A by PCR NEGATIVE NEGATIVE Final   Influenza B by PCR NEGATIVE NEGATIVE Final    Comment: (NOTE) The Xpert Xpress SARS-CoV-2/FLU/RSV assay is intended as an aid in  the diagnosis of influenza from Nasopharyngeal swab specimens and  should not be used as a sole basis for treatment. Nasal washings and  aspirates are unacceptable for Xpert Xpress SARS-CoV-2/FLU/RSV  testing.  Fact Sheet for Patients: https://www.moore.com/  Fact Sheet for Healthcare Providers: https://www.young.biz/  This test is not yet approved or cleared by the [place] FDA and  has been authorized for detection and/or diagnosis of SARS-CoV-2 by  FDA under an Emergency Use Authorization (EUA). This EUA will remain  in effect (meaning this test can be used) for the duration of the  Covid-19 declaration under Section 564(b)(1) of the Act, 21  U.S.C. section 360bbb-3(b)(1), unless the authorization is  terminated or revoked. Performed at [facility], [address] [place], [place] [address]     RADIOLOGY:  US PELVIC COMPLETE WITH TRANSVAGINAL  Result Date: 01/02/2020 CLINICAL DATA:  Initial evaluation for postmenopausal bleeding. EXAM: TRANSABDOMINAL AND TRANSVAGINAL ULTRASOUND OF PELVIS TECHNIQUE: Both transabdominal and transvaginal ultrasound examinations of the pelvis were performed. Transabdominal technique was performed for global imaging of the pelvis including  uterus, ovaries, adnexal regions, and pelvic cul-de-sac. It was necessary to proceed with endovaginal exam following the transabdominal exam to visualize the uterus, endometrium, and ovaries. COMPARISON:  None available. FINDINGS: Uterus Measurements: 5.3 x 2.8 x 5.2 cm = volume: 40.4 mL. No fibroids or other mass visualized. Endometrium Thickness: 3.8 mm. There is a focal echogenic lesion/area within the endometrial complex measuring 1.2 x 1.0 x 1.3 cm (image 51). Suggestion of an associated vascular stalk (image 48). Elsewhere, the endometrial stripe measures up to 3.8 mm in thickness. Right ovary Not visualized.  No adnexal mass. Left ovary Not visualized.  No adnexal mass. Other findings No abnormal free fluid. IMPRESSION: 1. 1.2 x 1.0 x 1.3 cm echogenic lesion within the endometrial complex as above, indeterminate. Primary differential considerations include an endometrial polyp, focal endometrial hyperplasia, or possibly endometrial carcinoma. In the setting of post-menopausal bleeding, endometrial sampling is indicated to exclude carcinoma. If results are benign, sonohysterogram should be considered for focal lesion work-up prior to hysteroscopy. (Ref: Radiological Reasoning: Algorithmic Workup of Abnormal Vaginal Bleeding with Endovaginal Sonography and Sonohysterography. AJR 2008; 240:X73-53) 2. Otherwise normal sonographic appearance of the uterus. 3. Nonvisualization of either ovary. No adnexal mass or free fluid within the pelvis. Electronically Signed   By: Rise Mu M.D.   On: 01/02/2020 19:10     CODE STATUS:     Code Status Orders  (From admission, onward)         Start     Ordered   12/27/19 2155  Full code  Continuous        12/27/19 2154        Code Status History    Date Active Date Inactive Code Status Order ID Comments User Context   12/12/2019 0246 12/18/2019 2104 Full Code [id number]  Hannah Beat, MD ED   04/27/2016 0857 04/30/2016 1830 Full Code [id number]  Milagros Loll, MD ED   03/05/2016 1549 03/12/2016 1723 Full Code [id number]  Eugenie Norrie, NP ED   02/06/2016 1532 02/11/2016 1925 Full Code [id number]  Hower, Cletis Athens, MD ED   02/01/2016 2117 02/05/2016 1359 Full Code [id number]  Auburn Bilberry, MD Inpatient   01/14/2016 1401 01/16/2016 2222 Full Code [id number]  Katha Hamming, MD ED   11/19/2014 2240 11/21/2014 2055 Full Code [id number]  Oralia Manis, MD Inpatient   05/22/2014 1831 05/25/2014 2020 Full Code [id number]  Toni Arthurs, MD Inpatient   05/21/2014 2231 05/22/2014 1831 Full Code [id number]  Yevonne Pax, MD Inpatient   Advance Care Planning Activity       TOTAL TIME TAKING CARE OF THIS PATIENT: *35 minutes.    Enedina Finner M.D  [facility]    CC: Primary care physician; Ethelda Chick, MD

## 2020-01-25 ENCOUNTER — Other Ambulatory Visit
Admission: RE | Admit: 2020-01-25 | Discharge: 2020-01-25 | Disposition: A | Discharge status: Home / Self Care | Payer: Medicare HMO | Source: Other Acute Inpatient Hospital | Attending: General Practice | Admitting: General Practice

## 2020-01-25 DIAGNOSIS — K922 Gastrointestinal hemorrhage, unspecified: Secondary | ICD-10-CM | POA: Insufficient documentation

## 2020-01-25 LAB — CBC WITH DIFFERENTIAL/PLATELET
Abs Immature Granulocytes: 0.04 10*3/uL (ref 0.00–0.07)
Basophils Absolute: 0 10*3/uL (ref 0.0–0.1)
Basophils Relative: 1 %
Eosinophils Absolute: 0.2 10*3/uL (ref 0.0–0.5)
Eosinophils Relative: 3 %
HCT: 34.6 % — ABNORMAL LOW (ref 36.0–46.0)
Hemoglobin: 10.8 g/dL — ABNORMAL LOW (ref 12.0–15.0)
Immature Granulocytes: 1 %
Lymphocytes Relative: 35 %
Lymphs Abs: 2.4 10*3/uL (ref 0.7–4.0)
MCH: 29.1 pg (ref 26.0–34.0)
MCHC: 31.2 g/dL (ref 30.0–36.0)
MCV: 93.3 fL (ref 80.0–100.0)
Monocytes Absolute: 0.6 10*3/uL (ref 0.1–1.0)
Monocytes Relative: 10 %
Neutro Abs: 3.5 10*3/uL (ref 1.7–7.7)
Neutrophils Relative %: 50 %
Platelets: 280 10*3/uL (ref 150–400)
RBC: 3.71 MIL/uL — ABNORMAL LOW (ref 3.87–5.11)
RDW: 17.2 % — ABNORMAL HIGH (ref 11.5–15.5)
WBC: 6.8 10*3/uL (ref 4.0–10.5)
nRBC: 0 % (ref 0.0–0.2)

## 2020-02-06 ENCOUNTER — Ambulatory Visit: Payer: Medicare HMO | Admitting: Gastroenterology

## 2020-02-06 ENCOUNTER — Other Ambulatory Visit: Payer: Self-pay

## 2020-07-22 ENCOUNTER — Other Ambulatory Visit: Payer: Self-pay

## 2020-07-22 ENCOUNTER — Observation Stay: Payer: Medicare Other

## 2020-07-22 ENCOUNTER — Inpatient Hospital Stay
Admission: EM | Admit: 2020-07-22 | Discharge: 2020-07-24 | DRG: 394 | Disposition: A | Discharge status: Skilled Nursing Facility | Payer: Medicare Other | Source: Skilled Nursing Facility | Attending: Obstetrics and Gynecology | Admitting: Obstetrics and Gynecology

## 2020-07-22 ENCOUNTER — Emergency Department: Payer: Medicare Other

## 2020-07-22 DIAGNOSIS — Z7982 Long term (current) use of aspirin: Secondary | ICD-10-CM | POA: Diagnosis not present

## 2020-07-22 DIAGNOSIS — K5900 Constipation, unspecified: Secondary | ICD-10-CM | POA: Diagnosis present

## 2020-07-22 DIAGNOSIS — F0390 Unspecified dementia without behavioral disturbance: Secondary | ICD-10-CM | POA: Diagnosis not present

## 2020-07-22 DIAGNOSIS — F32A Depression, unspecified: Secondary | ICD-10-CM | POA: Diagnosis present

## 2020-07-22 DIAGNOSIS — Z89422 Acquired absence of other left toe(s): Secondary | ICD-10-CM

## 2020-07-22 DIAGNOSIS — E785 Hyperlipidemia, unspecified: Secondary | ICD-10-CM | POA: Diagnosis not present

## 2020-07-22 DIAGNOSIS — Z794 Long term (current) use of insulin: Secondary | ICD-10-CM | POA: Diagnosis not present

## 2020-07-22 DIAGNOSIS — R8281 Pyuria: Secondary | ICD-10-CM | POA: Diagnosis present

## 2020-07-22 DIAGNOSIS — K2970 Gastritis, unspecified, without bleeding: Secondary | ICD-10-CM | POA: Diagnosis present

## 2020-07-22 DIAGNOSIS — I4510 Unspecified right bundle-branch block: Secondary | ICD-10-CM | POA: Diagnosis not present

## 2020-07-22 DIAGNOSIS — F419 Anxiety disorder, unspecified: Secondary | ICD-10-CM | POA: Diagnosis present

## 2020-07-22 DIAGNOSIS — I639 Cerebral infarction, unspecified: Secondary | ICD-10-CM | POA: Diagnosis not present

## 2020-07-22 DIAGNOSIS — Z83438 Family history of other disorder of lipoprotein metabolism and other lipidemia: Secondary | ICD-10-CM | POA: Diagnosis not present

## 2020-07-22 DIAGNOSIS — I69354 Hemiplegia and hemiparesis following cerebral infarction affecting left non-dominant side: Secondary | ICD-10-CM

## 2020-07-22 DIAGNOSIS — K219 Gastro-esophageal reflux disease without esophagitis: Secondary | ICD-10-CM | POA: Diagnosis not present

## 2020-07-22 DIAGNOSIS — E1169 Type 2 diabetes mellitus with other specified complication: Secondary | ICD-10-CM | POA: Diagnosis present

## 2020-07-22 DIAGNOSIS — Z20822 Contact with and (suspected) exposure to covid-19: Secondary | ICD-10-CM | POA: Diagnosis present

## 2020-07-22 DIAGNOSIS — R5383 Other fatigue: Secondary | ICD-10-CM | POA: Diagnosis present

## 2020-07-22 DIAGNOSIS — K5641 Fecal impaction: Secondary | ICD-10-CM | POA: Diagnosis present

## 2020-07-22 DIAGNOSIS — R140 Abdominal distension (gaseous): Secondary | ICD-10-CM | POA: Diagnosis not present

## 2020-07-22 DIAGNOSIS — E872 Acidosis: Secondary | ICD-10-CM | POA: Diagnosis present

## 2020-07-22 DIAGNOSIS — Z823 Family history of stroke: Secondary | ICD-10-CM | POA: Diagnosis not present

## 2020-07-22 DIAGNOSIS — E119 Type 2 diabetes mellitus without complications: Secondary | ICD-10-CM

## 2020-07-22 DIAGNOSIS — L8990 Pressure ulcer of unspecified site, unspecified stage: Secondary | ICD-10-CM | POA: Insufficient documentation

## 2020-07-22 DIAGNOSIS — R000 Tachycardia, unspecified: Secondary | ICD-10-CM

## 2020-07-22 DIAGNOSIS — L89151 Pressure ulcer of sacral region, stage 1: Secondary | ICD-10-CM | POA: Diagnosis not present

## 2020-07-22 DIAGNOSIS — K5939 Other megacolon: Secondary | ICD-10-CM | POA: Diagnosis not present

## 2020-07-22 DIAGNOSIS — R54 Age-related physical debility: Secondary | ICD-10-CM

## 2020-07-22 DIAGNOSIS — J449 Chronic obstructive pulmonary disease, unspecified: Secondary | ICD-10-CM | POA: Diagnosis not present

## 2020-07-22 DIAGNOSIS — I10 Essential (primary) hypertension: Secondary | ICD-10-CM | POA: Diagnosis present

## 2020-07-22 DIAGNOSIS — Z79899 Other long term (current) drug therapy: Secondary | ICD-10-CM | POA: Diagnosis not present

## 2020-07-22 DIAGNOSIS — Z87891 Personal history of nicotine dependence: Secondary | ICD-10-CM

## 2020-07-22 DIAGNOSIS — K5904 Chronic idiopathic constipation: Secondary | ICD-10-CM

## 2020-07-22 DIAGNOSIS — R1030 Lower abdominal pain, unspecified: Secondary | ICD-10-CM

## 2020-07-22 LAB — CBC
HCT: 38.6 % (ref 36.0–46.0)
Hemoglobin: 12.1 g/dL (ref 12.0–15.0)
MCH: 27.3 pg (ref 26.0–34.0)
MCHC: 31.3 g/dL (ref 30.0–36.0)
MCV: 86.9 fL (ref 80.0–100.0)
Platelets: 355 10*3/uL (ref 150–400)
RBC: 4.44 MIL/uL (ref 3.87–5.11)
RDW: 16.5 % — ABNORMAL HIGH (ref 11.5–15.5)
WBC: 9.5 10*3/uL (ref 4.0–10.5)
nRBC: 0 % (ref 0.0–0.2)

## 2020-07-22 LAB — URINALYSIS, COMPLETE (UACMP) WITH MICROSCOPIC
Bilirubin Urine: NEGATIVE
Glucose, UA: NEGATIVE mg/dL
Ketones, ur: NEGATIVE mg/dL
Nitrite: NEGATIVE
Protein, ur: NEGATIVE mg/dL
Specific Gravity, Urine: 1.021 (ref 1.005–1.030)
pH: 5 (ref 5.0–8.0)

## 2020-07-22 LAB — COMPREHENSIVE METABOLIC PANEL
ALT: 11 U/L (ref 0–44)
AST: 20 U/L (ref 15–41)
Albumin: 3.2 g/dL — ABNORMAL LOW (ref 3.5–5.0)
Alkaline Phosphatase: 58 U/L (ref 38–126)
Anion gap: 9 (ref 5–15)
BUN: 17 mg/dL (ref 8–23)
CO2: 29 mmol/L (ref 22–32)
Calcium: 8.9 mg/dL (ref 8.9–10.3)
Chloride: 98 mmol/L (ref 98–111)
Creatinine, Ser: 0.62 mg/dL (ref 0.44–1.00)
GFR, Estimated: >60 mL/min (ref >60)
Glucose, Bld: 147 mg/dL — ABNORMAL HIGH (ref 70–99)
Potassium: 3.9 mmol/L (ref 3.5–5.1)
Sodium: 136 mmol/L (ref 135–145)
Total Bilirubin: 0.8 mg/dL (ref 0.3–1.2)
Total Protein: 7.3 g/dL (ref 6.5–8.1)

## 2020-07-22 LAB — GLUCOSE, CAPILLARY
Glucose-Capillary: 61 mg/dL — ABNORMAL LOW (ref 70–99)
Glucose-Capillary: 112 mg/dL — ABNORMAL HIGH (ref 70–99)
Glucose-Capillary: 87 mg/dL (ref 70–99)

## 2020-07-22 LAB — LACTIC ACID, PLASMA
Lactic Acid, Venous: 2.6 mmol/L (ref 0.5–1.9)
Lactic Acid, Venous: 2.5 mmol/L (ref 0.5–1.9)

## 2020-07-22 LAB — CBG MONITORING, ED
Glucose-Capillary: 195 mg/dL — ABNORMAL HIGH (ref 70–99)
Glucose-Capillary: 43 mg/dL — CL (ref 70–99)

## 2020-07-22 LAB — RESP PANEL BY RT-PCR (FLU A&B, COVID) ARPGX2
Influenza A by PCR: NEGATIVE
Influenza B by PCR: NEGATIVE
SARS Coronavirus 2 by RT PCR: NEGATIVE

## 2020-07-22 LAB — HEMOGLOBIN A1C
Hgb A1c MFr Bld: 6 % — ABNORMAL HIGH (ref 4.8–5.6)
Mean Plasma Glucose: 125.5 mg/dL

## 2020-07-22 LAB — LIPASE, BLOOD: Lipase: 21 U/L (ref 11–51)

## 2020-07-22 MED ORDER — MORPHINE SULFATE (PF) 2 MG/ML IV SOLN: 1.0000 mg | INTRAVENOUS | Status: DC | PRN

## 2020-07-22 MED ORDER — LINAGLIPTIN 5 MG PO TABS: 5.0000 mg | ORAL_TABLET | Freq: Every day | ORAL | Status: DC

## 2020-07-22 MED ORDER — SODIUM CHLORIDE 0.9 % IV SOLN
1.0000 g | INTRAVENOUS | Status: DC
  Administered 2020-07-22 – 2020-07-23 (×2): 1 g via INTRAVENOUS
  Filled 2020-07-22: qty 1
  Filled 2020-07-22: qty 10

## 2020-07-22 MED ORDER — ONDANSETRON HCL 4 MG PO TABS
4.0000 mg | ORAL_TABLET | Freq: Four times a day (QID) | ORAL | Status: DC | PRN
Start: 2020-07-22 — End: 2020-07-24

## 2020-07-22 MED ORDER — ACETAMINOPHEN 325 MG PO TABS: 650.0000 mg | ORAL_TABLET | Freq: Four times a day (QID) | ORAL | Status: DC | PRN

## 2020-07-22 MED ORDER — AMITRIPTYLINE HCL 50 MG PO TABS: 75.0000 mg | ORAL_TABLET | Freq: Every day | ORAL | Status: DC

## 2020-07-22 MED ORDER — IOHEXOL 9 MG/ML PO SOLN
500.0000 mL | Freq: Two times a day (BID) | ORAL | Status: DC | PRN
  Administered 2020-07-22 (×2): 500 mL via ORAL

## 2020-07-22 MED ORDER — ONDANSETRON HCL 4 MG/2ML IJ SOLN
4.0000 mg | Freq: Once | INTRAMUSCULAR | Status: AC
  Administered 2020-07-22: 4 mg via INTRAVENOUS
  Filled 2020-07-22: qty 2

## 2020-07-22 MED ORDER — ACETAMINOPHEN 650 MG RE SUPP: 650.0000 mg | Freq: Four times a day (QID) | RECTAL | Status: DC | PRN

## 2020-07-22 MED ORDER — INSULIN ASPART 100 UNIT/ML ~~LOC~~ SOLN: 0.0000 [IU] | Freq: Every day | SUBCUTANEOUS | Status: DC

## 2020-07-22 MED ORDER — SORBITOL 70 % SOLN
30.0000 mL | Freq: Once | Status: DC
  Filled 2020-07-22: qty 30

## 2020-07-22 MED ORDER — LACTATED RINGERS IV SOLN: INTRAVENOUS | Status: DC

## 2020-07-22 MED ORDER — INSULIN ASPART 100 UNIT/ML ~~LOC~~ SOLN: 0.0000 [IU] | Freq: Three times a day (TID) | SUBCUTANEOUS | Status: DC

## 2020-07-22 MED ORDER — MORPHINE SULFATE (PF) 4 MG/ML IV SOLN
4.0000 mg | Freq: Once | INTRAVENOUS | Status: AC
  Administered 2020-07-22: 4 mg via INTRAVENOUS
  Filled 2020-07-22: qty 1

## 2020-07-22 MED ORDER — LACTATED RINGERS IV BOLUS
1000.0000 mL | Freq: Once | INTRAVENOUS | Status: AC
  Administered 2020-07-22: 1000 mL via INTRAVENOUS

## 2020-07-22 MED ORDER — SORBITOL 70 % SOLN
960.0000 mL | TOPICAL_OIL | Freq: Once | ORAL | Status: DC
  Filled 2020-07-22: qty 473

## 2020-07-22 MED ORDER — IOHEXOL 300 MG/ML  SOLN
100.0000 mL | Freq: Once | INTRAMUSCULAR | Status: AC | PRN
  Administered 2020-07-22: 100 mL via INTRAVENOUS

## 2020-07-22 MED ORDER — DEXTROSE 50 % IV SOLN
1.0000 | INTRAVENOUS | Status: DC | PRN
  Administered 2020-07-22 – 2020-07-23 (×3): 50 mL via INTRAVENOUS
  Filled 2020-07-22 (×4): qty 50

## 2020-07-22 MED ORDER — KETOROLAC TROMETHAMINE 15 MG/ML IJ SOLN
15.0000 mg | Freq: Four times a day (QID) | INTRAMUSCULAR | Status: AC | PRN
  Filled 2020-07-22: qty 1

## 2020-07-22 MED ORDER — ALBUTEROL SULFATE HFA 108 (90 BASE) MCG/ACT IN AERS
2.0000 | INHALATION_SPRAY | Freq: Four times a day (QID) | RESPIRATORY_TRACT | Status: DC | PRN
  Filled 2020-07-22: qty 6.7

## 2020-07-22 MED ORDER — ONDANSETRON HCL 4 MG/2ML IJ SOLN: 4.0000 mg | Freq: Four times a day (QID) | INTRAMUSCULAR | Status: DC | PRN

## 2020-07-22 MED ORDER — AMITRIPTYLINE HCL 50 MG PO TABS
50.0000 mg | ORAL_TABLET | Freq: Every day | ORAL | Status: DC
  Administered 2020-07-23: 50 mg via ORAL
  Filled 2020-07-22: qty 1
  Filled 2020-07-22: qty 2

## 2020-07-22 MED ORDER — POLYETHYLENE GLYCOL 3350 17 G PO PACK: 34.0000 g | PACK | Freq: Once | ORAL | Status: DC

## 2020-07-22 MED ORDER — ATORVASTATIN CALCIUM 20 MG PO TABS
20.0000 mg | ORAL_TABLET | Freq: Every day | ORAL | Status: DC
  Administered 2020-07-23: 20 mg via ORAL
  Filled 2020-07-22: qty 1

## 2020-07-22 NOTE — ED Provider Notes (Signed)
[facility] Emergency Department Provider Note ____________________________________________   Event Date/Time   First MD Initiated Contact with Patient 07/22/20 [phone]     (approximate)  I have reviewed the triage vital signs and the nursing notes.  HISTORY  Chief Complaint Abdominal Pain   HPI Molly [name] is a 75 y.o. femalewho presents to the ED for evaluation of abd pain and distension.   Chart review indicates hx of CVA with left-sided deficits, HTN, HLD, DM on insulin, history of C. Difficile.  ASA 81 is only thinner.  Patient presents to the ED via EMS from her local SNF due to 3 days of bilateral lower quadrant abdominal pain, abdominal distention and nausea.  Denies any emesis.  Reports 8/10 intensity pain.  She is uncertain about bowel movements as she uses diapers and the facility helps clean her.   Past Medical History:  Diagnosis Date  . Anxiety   . COPD (chronic obstructive pulmonary disease) (HCC)   . Diabetes (HCC)   . Hemorrhoid   . HLD (hyperlipidemia)   . HTN (hypertension)   . Stroke [facility]) 2008   left weakness    Patient Active Problem List   Diagnosis Date Noted  . Obesity, diabetes, and hypertension syndrome (HCC)   . Vaginal bleeding   . Acute urinary retention   . Acute metabolic encephalopathy   . Rectal bleeding   . Weakness   . Lethargy   . History of completed stroke   . GI bleeding 12/27/2019  . Lower GI bleed 12/27/2019  . C. difficile colitis 12/14/2019  . Acute colitis 12/12/2019  . Decubitus ulcer of coccyx, stage 2 (HCC) 04/28/2016  . Acute GI bleeding 04/27/2016  . Acute blood loss anemia   . Blood in stool   . Gastritis without bleeding   . Involuntary movements   . Acute respiratory failure (HCC) 03/05/2016  . Acute encephalopathy 02/06/2016  . Acute cerebrovascular accident (CVA) (HCC)   . Altered mental status   . 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 Cellulitis of left lower extremity 11/19/2014  .  COPD (chronic obstructive pulmonary disease) (HCC) 11/19/2014  . Essential hypertension 11/19/2014  . HLD (hyperlipidemia) 11/19/2014  . Sepsis (HCC) 11/19/2014  . Type 2 diabetes mellitus with hyperlipidemia (HCC) 05/21/2014  . Chronic anemia 05/21/2014    Past Surgical History:  Procedure Laterality Date  . AMPUTATION Left 05/22/2014   Procedure: AMPUTATION RAY LEFT FOURTH AND FIFTH ;  Surgeon: Toni Arthurs, MD;  Location: [facility] OR;  Service: Orthopedics;  Laterality: Left;  . CATARACT EXTRACTION    . COLONOSCOPY  2004  . COLONOSCOPY WITH PROPOFOL N/A 04/29/2016   Procedure: COLONOSCOPY WITH PROPOFOL;  Surgeon: Wyline Mood, MD;  Location[facility] ENDOSCOPY;  Service: Gastroenterology;  Laterality: N/A;  . ESOPHAGOGASTRODUODENOSCOPY (EGD) WITH PROPOFOL N/A 04/27/2016   Procedure: ESOPHAGOGASTRODUODENOSCOPY (EGD) WITH PROPOFOL;  Surgeon: Midge Minium, MD;  Location: [facility] ENDOSCOPY;  Service: Endoscopy;  Laterality: N/A;    Prior to Admission medications   Medication Sig Start Date End Date Taking? Authorizing Provider  acetaminophen (TYLENOL) 325 MG tablet Take 650 mg by mouth every 6 (six) hours as needed.    [provider]  albuterol (PROVENTIL HFA;VENTOLIN HFA) 108 (90 Base) MCG/ACT inhaler ALBUTEROL 90 MCG/ACT AERS 05/20/09   [provider]  amitriptyline (ELAVIL) 75 MG tablet Take 75 mg by mouth at bedtime.     [provider]  aspirin EC 81 MG tablet Take 81 mg by mouth daily.  02/28/09  [provider]  atorvastatin (LIPITOR) 20 MG tablet Take 20 mg by mouth daily at 6 PM.  12/07/19   [provider]  B Complex-C (SUPER B COMPLEX PO) Take 1 tablet by mouth daily.     [provider]  Cholecalciferol (VITAMIN D3) 1000 UNITS CAPS Take 1,000 Units by mouth daily.    [provider]  diclofenac sodium (VOLTAREN) 1 % GEL Apply 2 g topically 4 (four) times daily. To each shoulders    [provider]  HUMALOG 100 UNIT/ML  injection  01/04/20   [provider]  hydrocortisone (ANUSOL-HC) 25 MG suppository Place 1 suppository (25 mg total) rectally every 12 (twelve) hours. 12/26/19 12/25/20  Chesley NoonJessup, Charles, MD  mupirocin ointment (BACTROBAN) 2 % Place 1 application into the nose 2 (two) times daily. 01/01/20   Alford HighlandWieting, Richard, MD  pantoprazole (PROTONIX) 40 MG tablet  01/26/20   [provider]  sitaGLIPtin (JANUVIA) 100 MG tablet Take 100 mg by mouth daily.    [provider]  traZODone (DESYREL) 50 MG tablet Take 50 mg by mouth at bedtime.  09/11/19   [provider]    Allergies Patient has no known allergies.  Family History  Problem Relation Age of Onset  . Hyperlipidemia Mother   . Varicose Veins Mother   . Deep vein thrombosis Father   . Stroke Father   . Breast cancer Paternal Aunt   . Stroke Maternal Grandmother   . Stroke Paternal Grandmother     Social History Social History   Tobacco Use  . Smoking status: Former Smoker    Packs/day: 0.50    Years: 2.00    Pack years: 1.00    Types: Cigarettes    Quit date: 11/18/1968    Years since quitting: 51.7  . Smokeless tobacco: Never Used  Substance Use Topics  . Alcohol use: No    Alcohol/week: 0.0 standard drinks    Comment: seldom  . Drug use: No    Review of Systems  Constitutional: No fever/chills Eyes: No visual changes. ENT: No sore throat. Cardiovascular: Denies chest pain. Respiratory: Denies shortness of breath. Gastrointestinal:   Abdominal pain, nausea and distention , no vomiting.   Genitourinary: Negative for dysuria. Musculoskeletal: Negative for back pain. Skin: Negative for rash. Neurological: Negative for headaches, focal weakness or numbness.  ____________________________________________   PHYSICAL EXAM:  VITAL SIGNS: Vitals:   07/22/20 1002 07/22/20 1100  BP: 133/78 103/63  Pulse: (!) 117 93  Resp: 18 12  Temp: 97.6 F (36.4 C)   SpO2: 97% 97%       Constitutional: Alert and oriented.  Bedbound and chronically ill-appearing. Eyes: Conjunctivae are normal. PERRL. EOMI. Head: Atraumatic. Nose: No congestion/rhinnorhea. Mouth/Throat: Mucous membranes are dry.  Oropharynx non-erythematous. Neck: No stridor. No cervical spine tenderness to palpation. Cardiovascular: Tachycardic rate, regular rhythm. Grossly normal heart sounds.  Good peripheral circulation. Respiratory: Normal respiratory effort.  No retractions. Lungs CTAB. Gastrointestinal: Firm, distended and diffusely tender.  Guarding to the RLQ and LLQ. Marland Kitchen. No CVA tenderness. Musculoskeletal: No lower extremity tenderness nor edema.  No joint effusions. No signs of acute trauma. Neurologic:  . No gross focal neurologic deficits are appreciated.  Left-sided contractures and deficits at baseline. Skin:  Skin is warm, dry and intact. No rash noted. Psychiatric: Mood and affect are normal. Speech and behavior are normal.  ____________________________________________   LABS (all labs ordered are listed, but only abnormal results are displayed)  Labs Reviewed  COMPREHENSIVE METABOLIC  PANEL - Abnormal; Notable for the following components:      Result Value   Glucose, Bld 147 (*)    Albumin 3.2 (*)    All other components within normal limits  CBC - Abnormal; Notable for the following components:   RDW 16.5 (*)    All other components within normal limits  LACTIC ACID, PLASMA - Abnormal; Notable for the following components:   Lactic Acid, Venous 2.6 (*)    All other components within normal limits  RESP PANEL BY RT-PCR (FLU A&B, COVID) ARPGX2  LIPASE, BLOOD  URINALYSIS, COMPLETE (UACMP) WITH MICROSCOPIC  LACTIC ACID, PLASMA   ____________________________________________  12 Lead EKG  Sinus rhythm, rate of 117 bpm.  Normal axis.  Right bundle branch block.  No evidence of acute ischemia. Similar morphology to previous from  08/2019 ____________________________________________  RADIOLOGY  ED MD interpretation: CT abdomen/pelvis reviewed by me with fecal impaction  1 view CXR reviewed by me without evidence of acute cardiopulmonary pathology.  Official radiology report(s): CT ABDOMEN PELVIS W CONTRAST  Result Date: 07/22/2020 CLINICAL DATA:  75 year old female with abdominal pain and distension. EXAM: CT ABDOMEN AND PELVIS WITH CONTRAST TECHNIQUE: Multidetector CT imaging of the abdomen and pelvis was performed using the standard protocol following bolus administration of intravenous contrast. CONTRAST:  OMNIPAQUE IOHEXOL 300 MG/ML  SOLN COMPARISON:  CT Abdomen and Pelvis 12/27/2019 and earlier. FINDINGS: Lower chest: Stable cardiac size at the upper limits of normal. No pericardial or pleural effusion. Negative lung bases. Calcified coronary artery atherosclerosis. Hepatobiliary: Stable liver and gallbladder. Pancreas: Stable atrophy. Spleen: Stable, negative. Adrenals/Urinary Tract: Normal adrenal glands. There were no convincing renal calculi in September, such that renal collecting system density today is favored to be early contrast excretion. And otherwise both kidneys appears stable. Nondilated ureters. Mass effect on the urinary bladder is secondary to the abnormal rectosigmoid colon. The bladder remains relatively diminutive. Stomach/Bowel: Severe volume of retained stool in the rectosigmoid colon (sagittal image 62) with dilated gas-filled redundant sigmoid and additional retained stool in the upstream descending colon. The sigmoid measures up to 13.8 cm diameter. But there is no volvulus or transition point. Decompressed splenic flexure where wall thickening may be due to under distension. Redundant gas and stool-filled transverse and right colon also. Decompressed and negative terminal ileum. Appendix remains normal on coronal image 63. No dilated small bowel. Oral contrast has not yet reached the TI.  Negative stomach and duodenum. No free air. No definite free fluid. Vascular/Lymphatic: Aortoiliac calcified atherosclerosis. Major arterial structures remain patent. Portal venous system is patent. Reproductive: Negative aside from mass effect by the enlarged rectosigmoid colon. Other: No pelvic free fluid. Presacral stranding appears stable or mildly improved from September. Musculoskeletal: Advanced degeneration in the spine. Levels of degenerative ankylosis. Chronic pubic rami fractures. No acute osseous abnormality identified. IMPRESSION: 1. Severe distal Fecal Impaction with suggestion of superimposed generalized large bowel dysmotility. 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 2. No perforation or abscess. Mass effect on the urinary bladder. Normal appendix and small bowel. 3. No other acute or inflammatory process identified in the abdomen or pelvis. Aortic Atherosclerosis (ICD10-I70.0). Electronically Signed   By: Odessa Fleming M.D.   On: 07/22/2020 11:48   DG Chest Port 1 View  Result Date: 07/22/2020 CLINICAL DATA:  Tachycardia.  Abdominal pain. EXAM: PORTABLE CHEST 1 VIEW COMPARISON:  Chest x-ray dated December 11, 2019. FINDINGS: The heart size and mediastinal contours are within  normal limits. Both lungs are clear. The visualized skeletal structures are unremarkable. IMPRESSION: No active disease. Electronically Signed   By: Obie Dredge M.D.   On: 07/22/2020 10:54   ____________________________________________   PROCEDURES and INTERVENTIONS  Procedure(s) performed (including Critical Care):  .1-3 Lead EKG Interpretation Performed by: Delton Prairie, MD Authorized by: Delton Prairie, MD     Interpretation: abnormal     ECG rate:  115   ECG rate assessment: tachycardic     Rhythm: sinus tachycardia     Ectopy: none     Conduction: normal      Medications  iohexol (OMNIPAQUE) 9 MG/ML oral solution 500 mL (500 mLs Oral Contrast  Given 07/22/20 1044)  lactated ringers infusion (has no administration in time range)  polyethylene glycol (MIRALAX / GLYCOLAX) packet 34 g (has no administration in time range)  sorbitol, milk of mag, mineral oil, glycerin (SMOG) enema (has no administration in time range)  lactated ringers bolus 1,000 mL (1,000 mLs Intravenous New Bag/Given 07/22/20 1049)  morphine 4 MG/ML injection 4 mg (4 mg Intravenous Given 07/22/20 1046)  ondansetron (ZOFRAN) injection 4 mg (4 mg Intravenous Given 07/22/20 1046)  iohexol (OMNIPAQUE) 300 MG/ML solution 100 mL (100 mLs Intravenous Contrast Given 07/22/20 1121)    ____________________________________________   MDM / ED COURSE   Bedbound 75 year old woman from local SNF presents to the ED with a few days of worsening abdominal pain and distention, with evidence of severe fecal impaction without evidence of perforation or further obstruction, and requiring medical admission.  Presents tachycardic, improving with analgesia and IV fluids, otherwise maintains hemodynamic stability.  Blood work with mild lactic acidosis, but no leukocytosis or further signs of sepsis.  Patient provided IV fluids and repeat lactic is pending.  CXR unremarkable and EKG is nonischemic.  CT abdomen/pelvis with oral and IV contrast demonstrates severe stool impaction.  Bedside DRE unable to reach this impaction.  We will initiate MiraLAX and enema here in the ED and discussed the case with hospitalist for admission due to severity of her symptoms.   Clinical Course as of 07/22/20 1200  Mon Jul 22, 2020  1158 CT reviewed by me.  Subsequently go to the bedside and perform digital rectal exam to disimpact stool.  She has brown stool that is soft already coming from her anus.  On DRE, I am unable to reach the stool ball and only note small amount of brown soft stool.  We discussed medical admission and she is agreeable. [DS]    Clinical Course User Index [DS] Delton Prairie, MD     ____________________________________________   FINAL CLINICAL IMPRESSION(S) / ED DIAGNOSES  Final diagnoses:  Lower abdominal pain  Fecal impaction [facility])     ED Discharge Orders    None       Linton Stolp   Note:  This document was prepared using Dragon voice recognition software and may include unintentional dictation errors.   Delton Prairie, MD 07/22/20 [phone]

## 2020-07-22 NOTE — ED Notes (Signed)
Pt back from CT

## 2020-07-22 NOTE — ED Notes (Signed)
Contact made with floor RN.  Awaiting response.

## 2020-07-22 NOTE — Consult Note (Addendum)
[facility] SURGICAL CONSULTATION NOTE (initial) - cpt: 99242   HISTORY OF PRESENT ILLNESS (HPI):  75 y.o. female presented to [facility] ED today for evaluation of abdominal pain and distension. Patient is unable to provide any degree of reliable history. She does answer questions intermittently but mumbles softly and incoherently. Reportedly presenting to the ED from SNF for 3 days of bilateral lower abdominal pain, nausea, and emesis. Facility reports that she uses diapers and is reliant on staff for care but unsure when her last BM was. Does not appear that she has any narcotic dependence. No previous intra-abdominal surgeries per chart review. 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 Unfortunately needed readmission in early October for similar with GI bleeding. Her last colonoscopy was in 2018 with Dr Tobi Bastos, which showed significant stool burden, poor preporation, and inflammatory changes to the rectum concerning for colitis, which biopsy showed similar. Work up in the ED showed a normal WBC at 9.5K, sCr normal at 0.62, no electrolyte derangements, but she did have a mild lactic acidosis to 2.6. She underwent CT Abdomen/Pelvis which was concerning for significant colonic distension and stool burden, no free air or pneumatosis. EDP attempted to disimpact manually without success. She currently has Miralax and SMOG enema ordered in the ED, but not given.   Surgery is consulted by hospitalist physician Dr. Londell Moh, DO in this context for evaluation and management of colonic dilation and significant stool burden.  PAST MEDICAL HISTORY (PMH):  Past Medical History:  Diagnosis Date  . Anxiety   . COPD (chronic obstructive pulmonary disease) (HCC)   . Diabetes (HCC)   . Hemorrhoid   . HLD (hyperlipidemia)   . HTN (hypertension)   . Stroke [facility]) 2008   left  weakness     PAST SURGICAL HISTORY (PSH):  Past Surgical History:  Procedure Laterality Date  . AMPUTATION Left 05/22/2014   Procedure: AMPUTATION RAY LEFT FOURTH AND FIFTH ;  Surgeon: Toni Arthurs, MD;  Location: [facility] OR;  Service: Orthopedics;  Laterality: Left;  . CATARACT EXTRACTION    . COLONOSCOPY  2004  . COLONOSCOPY WITH PROPOFOL N/A 04/29/2016   Procedure: COLONOSCOPY WITH PROPOFOL;  Surgeon: Wyline Mood, MD;  Location: [facility] ENDOSCOPY;  Service: Gastroenterology;  Laterality: N/A;  . ESOPHAGOGASTRODUODENOSCOPY (EGD) WITH PROPOFOL N/A 04/27/2016   Procedure: ESOPHAGOGASTRODUODENOSCOPY (EGD) WITH PROPOFOL;  Surgeon: Midge Minium, MD;  Location: [facility] ENDOSCOPY;  Service: Endoscopy;  Laterality: N/A;     MEDICATIONS:  Prior to Admission medications   Medication Sig Start Date End Date Taking? Authorizing Provider  acetaminophen (TYLENOL) 325 MG tablet Take 650 mg by mouth every 6 (six) hours as needed.    [provider]  albuterol (PROVENTIL HFA;VENTOLIN HFA) 108 (90 Base) MCG/ACT inhaler ALBUTEROL 90 MCG/ACT AERS 05/20/09   [provider]  amitriptyline (ELAVIL) 75 MG tablet Take 75 mg by mouth at bedtime.     [provider]  aspirin EC 81 MG tablet Take 81 mg by mouth daily.  02/28/09   [provider]  atorvastatin (LIPITOR) 20 MG tablet Take 20 mg by mouth daily at 6 PM.  12/07/19   [provider]  B Complex-C (SUPER B COMPLEX PO) Take 1 tablet by mouth daily.     [provider]  Cholecalciferol (VITAMIN D3) 1000 UNITS CAPS Take 1,000 Units by mouth daily.  [provider]  diclofenac sodium (VOLTAREN) 1 % GEL Apply 2 g topically 4 (four) times daily. To each shoulders    [provider]  HUMALOG 100 UNIT/ML injection  01/04/20   [provider]  hydrocortisone (ANUSOL-HC) 25 MG suppository Place 1 suppository (25 mg total) rectally every 12 (twelve) hours. 12/26/19 12/25/20  Chesley NoonJessup, Charles, MD  mupirocin  ointment (BACTROBAN) 2 % Place 1 application into the nose 2 (two) times daily. 01/01/20   Alford HighlandWieting, Richard, MD  pantoprazole (PROTONIX) 40 MG tablet  01/26/20   [provider]  sitaGLIPtin (JANUVIA) 100 MG tablet Take 100 mg by mouth daily.    [provider]  traZODone (DESYREL) 50 MG tablet Take 50 mg by mouth at bedtime.  09/11/19   [provider]     ALLERGIES:  No Known Allergies   SOCIAL HISTORY:  Social History   Socioeconomic History  . Marital status: Married    Spouse name: Not on file  . Number of children: Not on file  . Years of education: Not on file  . Highest education level: Not on file  Occupational History  . Not on file  Tobacco Use  . Smoking status: Former Smoker    Packs/day: 0.50    Years: 2.00    Pack years: 1.00    Types: Cigarettes    Quit date: 11/18/1968    Years since quitting: 51.7  . Smokeless tobacco: Never Used  Substance and Sexual Activity  . Alcohol use: No    Alcohol/week: 0.0 standard drinks    Comment: seldom  . Drug use: No  . Sexual activity: Never  Other Topics Concern  . Not on file  Social History Narrative  . Not on file   Social Determinants of Health   Financial Resource Strain: Not on file  Food Insecurity: Not on file  Transportation Needs: Not on file  Physical Activity: Not on file  Stress: Not on file  Social Connections: Not on file  Intimate Partner Violence: Not on file     FAMILY HISTORY:  Family History  Problem Relation Age of Onset  . Hyperlipidemia Mother   . Varicose Veins Mother   . Deep vein thrombosis Father   . Stroke Father   . Breast cancer Paternal Aunt   . Stroke Maternal Grandmother   . Stroke Paternal Grandmother       REVIEW OF SYSTEMS:  Review of Systems  Unable to perform ROS: Mental status change  Gastrointestinal: Positive for abdominal pain and constipation.  All other systems reviewed and are negative.   VITAL SIGNS:  Temp:  [97.6 F (36.4  C)] 97.6 F (36.4 C) (04/25 1002) Pulse Rate:  [93-117] 95 (04/25 1200) Resp:  [12-23] 23 (04/25 1200) BP: (102-133)/(63-78) 102/70 (04/25 1200) SpO2:  [97 %-98 %] 98 % (04/25 1200) Weight:  [79.4 kg] 79.4 kg (04/25 0958)     Height: 5\' 4"  (162.6 cm) Weight: 79.4 kg BMI (Calculated): 30.02   INTAKE/OUTPUT:  No intake/output data recorded.  PHYSICAL EXAM:  Physical Exam Vitals and nursing note reviewed. Exam conducted with a chaperone present.  Constitutional:      Appearance: She is well-developed. She is obese.     Interventions: Nasal cannula in place.     Comments: Patient laying in ed, RN staff at bedside, she arouses to verbal stimuli intermittently but mumbles softly and incohernetly  HENT:     Head: Normocephalic and atraumatic.  Eyes:     General:  No scleral icterus.    Extraocular Movements: Extraocular movements intact.  Cardiovascular:     Rate and Rhythm: Normal rate and regular rhythm.     Heart sounds: Normal heart sounds. No murmur heard.   Pulmonary:     Effort: Pulmonary effort is normal. No respiratory distress.  Abdominal:     General: Abdomen is protuberant. There is distension.     Tenderness: There is no guarding or rebound.     Comments: Abdomen is markedly distended, examination is limited by mental status but she does not appear to have any specific tenderness, she is without gross evidence of peritonitis on my examination  Genitourinary:    Comments: Deferred Musculoskeletal:     Comments: Chronic contracture to the left upper extremity  Skin:    General: Skin is warm and dry.     Coloration: Skin is not jaundiced.  Neurological:     Mental Status: She is alert.     Comments: Unable to reliably assess  Psychiatric:     Comments: Unable to reliably assess      Labs:  CBC Latest Ref Rng & Units 07/22/2020 01/25/2020 01/03/2020  WBC 4.0 - 10.5 K/uL 9.5 6.8 9.2  Hemoglobin 12.0 - 15.0 g/dL 74.2 10.8(L) 10.7(L)  Hematocrit 36.0 - 46.0 % 38.6  34.6(L) 33.5(L)  Platelets 150 - 400 K/uL 355 280 300   CMP Latest Ref Rng & Units 07/22/2020 01/03/2020 01/02/2020  Glucose 70 - 99 mg/dL 595(G) 387(F) 643(P)  BUN 8 - 23 mg/dL 17 11 9   Creatinine 0.44 - 1.00 mg/dL 2.95 1.88  Sodium 135 - 145 mmol/L 136 134(L) 135  Potassium 3.5 - 5.1 mmol/L 3.9 4.3 4.0  Chloride 98 - 111 mmol/L 98 97(L) 96(L)  CO2 22 - 32 mmol/L 29 29 29   Calcium 8.9 - 10.3 mg/dL 8.9 8.9 4.16)  Total Protein 6.5 - 8.1 g/dL 7.3 - -  Total Bilirubin 0.3 - 1.2 mg/dL 0.8 - -  Alkaline Phos 38 - 126 U/L 58 - -  AST 15 - 41 U/L 20 - -  ALT 0 - 44 U/L 11 - -    Imaging studies:   CT Abdomen/Pelvis (07/22/2020) personally reviewed which shows markedly dilated colon throughout with significant stool burden and upon review of previous images (dating back to Sept 2019) she does have significant stool burden present but dilation is worse, no free air or pneumatosis, and radiologist report reviewed:  IMPRESSION: 1. Severe distal Fecal Impaction with suggestion of superimposed generalized large bowel dysmotility. 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  2. No perforation or abscess. Mass effect on the urinary bladder. Normal appendix and small bowel.  3. No other acute or inflammatory process identified in the abdomen or pelvis. Aortic Atherosclerosis (ICD10-I70.0).   Assessment/Plan: (ICD-10's: K61.41) 75 y.o. female with marked abdominal distension found to have worsening of what may be chronic colonic dilation with significant distal stool burden, complicated by pertinent comorbidities including CVA with left-sided deficits, HTN, HLD, DM, and debilitation.   - I have a low suspicion for toxic megacolon as she is without any associated active infectious process currently (ex: c diff), she is without leukocytosis, and her abdominal examination is without gross evidence of peritonitis at this time. Her sigmoid is  significantly dilated up to 13.8 cm, but again she is without signs of bowel compromise at this time. She does have a lactic acidosis but I suspect she may be dehydrated  as well contributing to this. Also considered Ogilvie syndrome, but again she is without recent (<5 days) infectious process, recent surgical procedures, or chronic narcotic use. I do not think there is any role for surgical disimpaction in the OR. May be able to see if GI has any resources for colonoscopic disimpaction but with degree of distension, I suspect further dilation would be contra-indicated. We can attempt to provide PO medications to aid in motility including sorbitol, milk of magnesium, lactulose, etc..Marland KitchenI think enemas are reasonable option but I will defer until dr Lady Gary has a chance to review images.   All of the above findings and recommendations were discussed with the medical team.   Thank you for the opportunity to participate in this patient's care.   -- Lynden Oxford, PA-C [facility] 07/22/2020, 12:58 PM [phone] M-F: 7am - 4pm  At the time of my evaluation, around 8:00 PM, the patient was more lucid than she is described above.  She continues to be unable to elucidate for me when she last had a bowel movement nor if/when she last passed flatus.  She describes her abdomen is uncomfortable, but not painful.  She is minimally tender to palpation, without peritoneal signs.  The most distal bulk of stool is beyond the extent of my fingers and is not likely to come within reach in the operating room.  While there certainly is a potential risk of stercoral perforation, I think a more conservative approach to management, with enemas from below and other agents, such as polyethylene glycol, sorbitol, etc., is probably in this patient's interest, as opposed to any attempts that would require general anesthesia.  She likely has multifactorial slow transit through her GI tract, related to age,  immobility, and chronic systemic illness.  Another option to consider would be rigid or flexible sigmoidoscopy with instrumentation to try and break up the impacted stool, but I think in the setting of the dilated colon, gastroenterology will be reluctant to embark on this endeavor.  If possible, I would recommend performing the enemas using a Foley catheter to get past the mass of stool, if possible.  This appears to be a very chronic and longstanding problem for this patient.  In some cases, total abdominal colectomy can be performed, but I do not think this debilitated patient is likely to tolerate that degree of physiological insult.  I agree that this does not appear to be toxic megacolon, given the patient's hemodynamic stability, lack of peritoneal signs, lack of leukocytosis, and improving lactic acid with fluid resuscitation.  I saw and evaluated the patient.  I agree with the above documentation, exam, and plan, which I have edited where appropriate. Duanne Guess  8:34 PM

## 2020-07-22 NOTE — ED Notes (Signed)
EDP at bedside  

## 2020-07-22 NOTE — ED Notes (Signed)
Called pharmacy because SMOG enema still not available. They will send.

## 2020-07-22 NOTE — H&P (Addendum)
History and Physical   Molly [name]:[id number] DOB: [birth year] DOA: 07/22/2020  PCP: Ethelda Chick, MD  Outpatient Specialists: Dr. Servando Snare, Gastroenterology Patient coming from: facility  I have personally briefly reviewed patient's old medical records in [facility] EMR.  Chief Concern: Abdominal pain  HPI: Molly [name] is a 75 y.o. female with medical history significant for hyperlipidemia, depression, anxiety, CVA with left-sided residual weakness, insulin-dependent mellitus, dementia, history hypertension, GERD, presents to the emergency department for chief concerns of abdominal pain.  Patient states that her abdominal pain and discomfort has been ongoing for the past 3 days.  She states that the facility told her that she had a bowel movement in the a.m. prior to being transported to the emergency department.  Patient is not able to tell me if the bowel movement is watery or well-formed.  Social history: Patient lives at home in a facility.  Vaccination: Unknown vaccination history  ROS: Unable to complete due to advanced dementia  ED Course: Discussed with ED provider, patient requiring hospitalization for severe constipation.  Vitals in the emergency department is remarkable for temperature of 97.6, respiration rate of 13, heart rate 89, blood pressure 102/70, SPO2 of 96% on room air.  Patient is status post morphine 4 mg IV once, ondansetron 4 mg IV once, LR bolus 1 L per ED provider.  Labs in the emergency department was remarkable for lactic acid of 2.6 improved to 2.5, respiratory panel was negative including COVID PCR, influenza a and B, nonfasting blood glucose was 147, albumin 3.2.  EDP attempted digital rectal disimpaction with minimal stool output.  EDP ordered smog enema and oral GlycoLax.  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 Lady Gary has been consulted for OR disimpaction via secure chat - I have canceled smog enema and oral GlycoLax as patient's severity of megacolon will not improve with these treatments and in may worsen her presentation by increasing intragastric pressure.  And if this is megacolon, the integrity of the bowel wall is likely compromised which she would benefit from general surgery interventionI instead of an edema via RN at bedside. - I am awaiting general surgery evaluation - As patient is not nauseous or vomiting at this time, NG tube has not been placed - If patient develops nausea or vomiting, will place NG tube - N.p.o. - KUB ordered for 2300 today, and 8 AM from 07/23/2020 - Status post lactated Ringer's - Continue LR at 150 mL/h - Admit to progressive cardiac, observation, with telemetry, 24 hours ordered  Hypertension- currently low normotensive, patient states that the facility likely gave her her medications this morning  Pyuria - ceftriaxone 1 g IV started QT prolongation- suspect secondary to toxic megacolon, treat as above  Hyperlipidemia-atorvastatin 20 mg q. at bedtime resumed for 07/23/2020  Non-insulin-dependent diabetes mellitus-resumed equivalent to Januvia, Tradjenta 5 mg daily for 07/23/2020  Depression/anxiety-resumed amitriptyline 75 mg nightly, as if I discontinue this medication she may withdraw  History of CVA with left-sided  hemiparesis  COPD-not oxygen dependent, appears to be compensated at this time - Resumed albuterol inhalers as needed  DVT prophylaxis- TED hose only, I have not initiated pharmacologic DVT prophylaxis in anticipation of possible OR - A.m. team to initiate DVT prophylaxis when it is appropriate  As needed medications: Acetaminophen and ketorolac IV for mild to moderate pain, acetaminophen for fever, headaches, morphine for severe pain, ondansetron for nausea and vomiting.  Chart reviewed.   DVT prophylaxis: TED hose, no pharmacologic DVT prophylaxis Code Status: Full code Diet: N.p.o. Family Communication: No Disposition Plan: Pending clinical course Consults called: General surgery Admission status: Observation, progressive cardiac, with telemetry  Past Medical History:  Diagnosis Date  . Anxiety   . COPD (chronic obstructive pulmonary disease) (HCC)   . Diabetes (HCC)   . Hemorrhoid   . HLD (hyperlipidemia)   . HTN (hypertension)   . Stroke (HCC) 2008   left weakness   Past Surgical History:  Procedure Laterality Date  . AMPUTATION Left 2/23[facility]/2016   Procedure: AMPUTATION RAY LEFT FOURTH AND FIFTH ;  Surgeon: Toni ArthursJohn Hewitt, MD;  [facility];  Service: Orthopedics;  Laterality: Left;  . CATARACT EXTRACTION    . COLONOSCOPY  2004  . COLONOSCOPY WITH PROPOFOL N/A 04/29/2016   Procedure: COLONOSCOPY WITH PROPOFOL;  Surgeon: Wyline MoodKiran Anna, MD;  [facility];  Service: Gastroenterology;  Laterality: N/A;  . ESOPHAGOGASTRODUODENOSCOPY (EGD) WITH PROPOFOL N/A 04/27/2016   Procedure: ESOPHAGOGASTRODUODENOSCOPY (EGD) WITH PROPOFOL;  Surgeon: Midge Miniumarren Wohl, MD;  Location: [facility] ENDOSCOPY;  Service: Endoscopy;  Laterality: N/A;   Social History:  reports that she quit smoking about 51 years ago. Her smoking use included cigarettes. She has a 1.00 pack-year smoking history. She has never used smokeless tobacco. She reports that she does not drink alcohol and does not use  drugs.  No Known Allergies Family History  Problem Relation Age of Onset  . Hyperlipidemia Mother   . Varicose Veins Mother   . Deep vein thrombosis Father   . Stroke Father   . Breast cancer Paternal Aunt   . Stroke Maternal Grandmother   . Stroke Paternal Grandmother    Family history: Family history reviewed and not pertinent  Prior to Admission medications   Medication Sig Start Date End Date Taking? Authorizing Provider  acetaminophen (TYLENOL) 325 MG tablet Take 650 mg by mouth every 6 (six) hours as needed.    [provider]  albuterol (PROVENTIL HFA;VENTOLIN HFA) 108 (90 Base) MCG/ACT inhaler ALBUTEROL 90 MCG/ACT AERS 05/20/09   [provider]  amitriptyline (ELAVIL) 75 MG tablet Take 75 mg by mouth at bedtime.     [provider]  aspirin EC 81 MG tablet Take 81 mg by mouth daily.  02/28/09   [provider]  atorvastatin (LIPITOR) 20 MG tablet Take 20 mg by mouth daily at 6 PM.  12/07/19   [provider]  B Complex-C (SUPER B COMPLEX PO) Take 1 tablet by mouth daily.     [provider]  Cholecalciferol (VITAMIN D3) 1000 UNITS CAPS Take 1,000 Units by mouth daily.    [provider]  diclofenac sodium (VOLTAREN) 1 % GEL Apply 2 g topically 4 (four) times daily. To each shoulders    [provider]  HUMALOG 100 UNIT/ML injection  01/04/20   [provider]  hydrocortisone (ANUSOL-HC) 25 MG suppository Place 1 suppository (25 mg total) rectally every 12 (twelve) hours. 12/26/19 12/25/20  Chesley NoonJessup, Charles,  MD  mupirocin ointment (BACTROBAN) 2 % Place 1 application into the nose 2 (two) times daily. 01/01/20   Alford Highland, MD  pantoprazole (PROTONIX) 40 MG tablet  01/26/20   [provider]  sitaGLIPtin (JANUVIA) 100 MG tablet Take 100 mg by mouth daily.    [provider]  traZODone (DESYREL) 50 MG tablet Take 50 mg by mouth at bedtime.  09/11/19   [provider]    Physical Exam: Vitals:   07/22/20 0958 07/22/20 1002 07/22/20 1100 07/22/20 1200  BP:  133/78 103/63 102/70  Pulse:  (!) 117 93 95  Resp:  18 12 (!) 23  Temp:  97.6 F (36.4 C)    TempSrc:  Axillary    SpO2:  97% 97% 98%  Weight: 79.4 kg     Height: 5\' 4"  (1.626 m)      Constitutional: appears older than chronological age, NAD, calm, mild to moderate discomfort Eyes: PERRL, lids and conjunctivae normal ENMT: Mucous membranes are dry. Posterior pharynx clear of any exudate or lesions. Age-appropriate dentition. Hearing appropriate Neck: normal, supple, no masses, no thyromegaly Respiratory: clear to auscultation bilaterally, no wheezing, no crackles. Normal respiratory effort. No accessory muscle use.  Cardiovascular: Regular rate and rhythm, no murmurs / rubs / gallops. No extremity edema. 2+ pedal pulses. No carotid bruits.  Abdomen: Distended abdomen, + tenderness. No bowel sounds.  Musculoskeletal: no clubbing / cyanosis. No joint deformity upper and lower extremities on the right side.  Decreased range of motion on the left upper and lower extremity. contractures and atrophy in the left upper extremity. Skin: no rashes, lesions, ulcers. No induration Neurologic: Sensation intact. Strength 5/5 in all 4.  Psychiatric: Normal judgment and insight. Alert and oriented x self and location of hospital. Normal mood.   EKG: independently reviewed, showing sinus tachycardia rate of 117, QTc 522, right bundle branch block,  Imaging on Admission: I personally reviewed and I agree with radiologist reading as below.  CT ABDOMEN PELVIS W CONTRAST  Result Date: 07/22/2020 CLINICAL DATA:  75 year old female with abdominal pain and distension. EXAM: CT ABDOMEN AND PELVIS WITH CONTRAST TECHNIQUE: Multidetector CT imaging of the abdomen and pelvis was performed using the standard protocol following bolus administration of intravenous contrast. CONTRAST:  66 OMNIPAQUE IOHEXOL 300 MG/ML  SOLN  COMPARISON:  CT Abdomen and Pelvis 12/27/2019 and earlier. FINDINGS: Lower chest: Stable cardiac size at the upper limits of normal. No pericardial or pleural effusion. Negative lung bases. Calcified coronary artery atherosclerosis. Hepatobiliary: Stable liver and gallbladder. Pancreas: Stable atrophy. Spleen: Stable, negative. Adrenals/Urinary Tract: Normal adrenal glands. There were no convincing renal calculi in September, such that renal collecting system density today is favored to be early contrast excretion. And otherwise both kidneys appears stable. Nondilated ureters. Mass effect on the urinary bladder is secondary to the abnormal rectosigmoid colon. The bladder remains relatively diminutive. Stomach/Bowel: Severe volume of retained stool in the rectosigmoid colon (sagittal image 62) with dilated gas-filled redundant sigmoid and additional retained stool in the upstream descending colon. The sigmoid measures up to 13.8 cm diameter. But there is no volvulus or transition point. Decompressed splenic flexure where wall thickening may be due to under distension. Redundant gas and stool-filled transverse and right colon also. Decompressed and negative terminal ileum. Appendix remains normal on coronal image 63. No dilated small bowel. Oral contrast has not yet reached the TI. Negative stomach and duodenum. No free air. No definite free fluid. Vascular/Lymphatic: Aortoiliac calcified atherosclerosis. Major arterial structures remain  patent. Portal venous system is patent. Reproductive: Negative aside from mass effect by the enlarged rectosigmoid colon. Other: No pelvic free fluid. Presacral stranding appears stable or mildly improved from September. Musculoskeletal: Advanced degeneration in the spine. Levels of degenerative ankylosis. Chronic pubic rami fractures. No acute osseous abnormality identified. IMPRESSION: 1. Severe distal Fecal Impaction with suggestion of superimposed generalized large bowel  dysmotility. 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 2. No perforation or abscess. Mass effect on the urinary bladder. Normal appendix and small bowel. 3. No other acute or inflammatory process identified in the abdomen or pelvis. Aortic Atherosclerosis (ICD10-I70.0). Electronically Signed   By: Odessa Fleming M.D.   On: 07/22/2020 11:48   DG Chest Port 1 View  Result Date: 07/22/2020 CLINICAL DATA:  Tachycardia.  Abdominal pain. EXAM: PORTABLE CHEST 1 VIEW COMPARISON:  Chest x-ray dated December 11, 2019. FINDINGS: The heart size and mediastinal contours are within normal limits. Both lungs are clear. The visualized skeletal structures are unremarkable. IMPRESSION: No active disease. Electronically Signed   By: Obie Dredge M.D.   On: 07/22/2020 10:54   Labs on Admission: I have personally reviewed following labs  CBC: Recent Labs  Lab 07/22/20 1005  WBC 9.5  HGB 12.1  HCT 38.6  MCV 86.9  PLT 355   Basic Metabolic Panel: Recent Labs  Lab 07/22/20 1005  NA 136  K 3.9  CL 98  CO2 29  GLUCOSE 147*  BUN 17  CREATININE 0.62  CALCIUM 8.9   GFR: Estimated Creatinine Clearance: 62.9 mL/min (by C-G formula based on SCr of 0.62 mg/dL).  Liver Function Tests: Recent Labs  Lab 07/22/20 1005  AST 20  ALT 11  ALKPHOS 58  BILITOT 0.8  PROT 7.3  ALBUMIN 3.2*   Recent Labs  Lab 07/22/20 1005  LIPASE 21   Urine analysis:    Component Value Date/Time   COLORURINE YELLOW (A) 12/11/2019 2211   APPEARANCEUR HAZY (A) 12/11/2019 2211   LABSPEC 1.025 12/11/2019 2211   PHURINE 5.0 12/11/2019 2211   GLUCOSEU >=500 (A) 12/11/2019 2211   HGBUR NEGATIVE 12/11/2019 2211   BILIRUBINUR NEGATIVE 12/11/2019 2211   KETONESUR 20 (A) 12/11/2019 2211   PROTEINUR 30 (A) 12/11/2019 2211   UROBILINOGEN 0.2 10/15/2006 1244   NITRITE NEGATIVE 12/11/2019 2211   LEUKOCYTESUR NEGATIVE 12/11/2019 2211   CRITICAL CARE Performed by: Nadyne Coombes  Gregorio Worley  Total critical care time: 35 minutes  Critical care time was exclusive of separately billable procedures and treating other patients.  Critical care was necessary to treat or prevent imminent or life-threatening deterioration. Acute abdomen.  Critical care was time spent personally by me on the following activities: development of treatment plan with patient and/or surrogate as well as nursing, discussions with consultants, evaluation of patient's response to treatment, examination of patient, obtaining history from patient or surrogate, ordering and performing treatments and interventions, ordering and review of laboratory studies, ordering and review of radiographic studies, pulse oximetry and re-evaluation of patient's condition.  [place] N Cali Hope D.O. [facility]  If 7PM-7AM, please contact overnight-coverage provider If 7AM-7PM, please contact day coverage provider [email or web address]  07/22/2020, 1:56 PM

## 2020-07-22 NOTE — ED Notes (Signed)
Will administer SMOG enema once available from pharmacy.

## 2020-07-22 NOTE — Progress Notes (Addendum)
No charge progress note  General surgery has updated their consult note  Dr. Lady Gary recommends the following:    'At the time of my evaluation, around 8:00 PM, the patient was more lucid than she is described above.  She continues to be unable to elucidate for me when she last had a bowel movement nor if/when she last passed flatus.  She describes her abdomen is uncomfortable, but not painful.  She is minimally tender to palpation, without peritoneal signs.  The most distal bulk of stool is beyond the extent of my fingers and is not likely to come within reach in the operating room.  While there certainly is a potential risk of stercoral perforation, I think a more conservative approach to management, with enemas from below and other agents, such as polyethylene glycol, sorbitol, etc., is probably in this patient's interest, as opposed to any attempts that would require general anesthesia.  She likely has multifactorial slow transit through her GI tract, related to age, immobility, and chronic systemic illness.  Another option to consider would be rigid or flexible sigmoidoscopy with instrumentation to try and break up the impacted stool, but I think in the setting of the dilated colon, gastroenterology will be reluctant to embark on this endeavor.  If possible, I would recommend performing the enemas using a Foley catheter to get past the mass of stool, if possible.  This appears to be a very chronic and longstanding problem for this patient.  In some cases, total abdominal colectomy can be performed, but I do not think this debilitated patient is likely to tolerate that degree of physiological insult.  I agree that this does not appear to be toxic megacolon, given the patient's hemodynamic stability, lack of peritoneal signs, lack of leukocytosis, and improving lactic acid with fluid resuscitation.'  Sorbitol 30 ml p.o. per general surgery recommendation.  RN Communication for foley enema - checking  policy for scope of practice    Dr. Londell Moh  [facility]

## 2020-07-22 NOTE — ED Triage Notes (Signed)
Pt to ED from [facility]  Pt c/o abdominal pain lower R, L quadrants for 3days but pain moves around to upper abdomen as well.   Upper abdomen visibly distended, LBM this morning A&O times 2 130/70, 90-100 HR, 96% RA, T 98 167 CBG Has long GI history, including ileus, GI bleeds  Pt has L sided motor deficits, arm contracture, from hx old stroke

## 2020-07-22 NOTE — ED Notes (Signed)
Repeat CBG 195.

## 2020-07-22 NOTE — ED Notes (Signed)
Informed Dr Sedalia Muta of elevated second lactic after 1L fluid bolus.

## 2020-07-22 NOTE — ED Notes (Signed)
EDP at bedside attempting manual disimpaction.

## 2020-07-22 NOTE — ED Notes (Signed)
Informed EDP of lactic 2.6, drawn before IV fluid bolus started.  Called CT to let them know pt has consumed both bottles of contrast.

## 2020-07-22 NOTE — ED Notes (Signed)
Pt to xray

## 2020-07-23 ENCOUNTER — Observation Stay: Payer: Medicare Other

## 2020-07-23 DIAGNOSIS — N390 Urinary tract infection, site not specified: Secondary | ICD-10-CM | POA: Diagnosis not present

## 2020-07-23 DIAGNOSIS — K5900 Constipation, unspecified: Secondary | ICD-10-CM

## 2020-07-23 DIAGNOSIS — E1169 Type 2 diabetes mellitus with other specified complication: Secondary | ICD-10-CM | POA: Diagnosis not present

## 2020-07-23 DIAGNOSIS — R140 Abdominal distension (gaseous): Secondary | ICD-10-CM | POA: Diagnosis not present

## 2020-07-23 DIAGNOSIS — K5641 Fecal impaction: Secondary | ICD-10-CM | POA: Diagnosis not present

## 2020-07-23 DIAGNOSIS — I10 Essential (primary) hypertension: Secondary | ICD-10-CM | POA: Diagnosis not present

## 2020-07-23 DIAGNOSIS — I639 Cerebral infarction, unspecified: Secondary | ICD-10-CM | POA: Diagnosis not present

## 2020-07-23 DIAGNOSIS — L8990 Pressure ulcer of unspecified site, unspecified stage: Secondary | ICD-10-CM | POA: Insufficient documentation

## 2020-07-23 LAB — CBC
HCT: 35.8 % — ABNORMAL LOW (ref 36.0–46.0)
Hemoglobin: 11.1 g/dL — ABNORMAL LOW (ref 12.0–15.0)
MCH: 27.7 pg (ref 26.0–34.0)
MCHC: 31 g/dL (ref 30.0–36.0)
MCV: 89.3 fL (ref 80.0–100.0)
Platelets: 274 10*3/uL (ref 150–400)
RBC: 4.01 MIL/uL (ref 3.87–5.11)
RDW: 16.4 % — ABNORMAL HIGH (ref 11.5–15.5)
WBC: 7.4 10*3/uL (ref 4.0–10.5)
nRBC: 0 % (ref 0.0–0.2)

## 2020-07-23 LAB — GLUCOSE, CAPILLARY
Glucose-Capillary: 62 mg/dL — ABNORMAL LOW (ref 70–99)
Glucose-Capillary: 135 mg/dL — ABNORMAL HIGH (ref 70–99)
Glucose-Capillary: 95 mg/dL (ref 70–99)
Glucose-Capillary: 79 mg/dL (ref 70–99)
Glucose-Capillary: 133 mg/dL — ABNORMAL HIGH (ref 70–99)

## 2020-07-23 LAB — BASIC METABOLIC PANEL
Anion gap: 8 (ref 5–15)
BUN: 11 mg/dL (ref 8–23)
CO2: 29 mmol/L (ref 22–32)
Calcium: 8.9 mg/dL (ref 8.9–10.3)
Chloride: 101 mmol/L (ref 98–111)
Creatinine, Ser: 0.46 mg/dL (ref 0.44–1.00)
GFR, Estimated: >60 mL/min (ref >60)
Glucose, Bld: 74 mg/dL (ref 70–99)
Potassium: 4 mmol/L (ref 3.5–5.1)
Sodium: 138 mmol/L (ref 135–145)

## 2020-07-23 LAB — HEMOGLOBIN A1C
Hgb A1c MFr Bld: 5.7 % — ABNORMAL HIGH (ref 4.8–5.6)
Mean Plasma Glucose: 116.89 mg/dL

## 2020-07-23 MED ORDER — ENOXAPARIN SODIUM 40 MG/0.4ML ~~LOC~~ SOLN
40.0000 mg | SUBCUTANEOUS | Status: DC
  Filled 2020-07-23: qty 0.4

## 2020-07-23 MED ORDER — MAGNESIUM CITRATE PO SOLN
1.0000 | Freq: Once | ORAL | Status: DC
  Filled 2020-07-23: qty 296

## 2020-07-23 MED ORDER — SORBITOL 70 % SOLN
960.0000 mL | TOPICAL_OIL | Freq: Once | ORAL | Status: DC
  Filled 2020-07-23: qty 473

## 2020-07-23 MED ORDER — BISACODYL 5 MG PO TBEC: 5.0000 mg | DELAYED_RELEASE_TABLET | Freq: Every day | ORAL | Status: DC | PRN

## 2020-07-23 MED ORDER — BISACODYL 5 MG PO TBEC: 10.0000 mg | DELAYED_RELEASE_TABLET | Freq: Once | ORAL | Status: DC

## 2020-07-23 NOTE — Care Management Obs Status (Signed)
MEDICARE OBSERVATION STATUS NOTIFICATION   Patient Details  Name: Molly [name] MRN: [id number] Date of Birth: 1946-03-17   Medicare Observation Status Notification Given:  Yes    Gildardo Griffes, LCSW 07/23/2020, 2:57 PM

## 2020-07-23 NOTE — Progress Notes (Signed)
Cross Cover Patient  now passing large amounts of stool with left turn assistance and instructing to bare down.  She is stable to transfer to [facility] unit

## 2020-07-23 NOTE — TOC Initial Note (Signed)
Transition of Care [facility]) - Initial/Assessment Note    Patient Details  Name: Molly [name] MRN: [id number] Date of Birth: [birth year]  Transition of Care [facility]) CM/SW Contact:    Gildardo Griffes, LCSW Phone Number: 07/23/2020, 3:06 PM  Clinical Narrative:                  Patient from [facility] in long term care, confirmed with Information systems manager at [facility]. Clinician spoke with patient's husband [place] via phone call, due to patient's fluctuating orientation with advanced dementia. Patient's husband reports discharge plan is for patient to return to [facility]. Patient's husband also provided observation letter notice, agreeable for clinician to sign on his behalf. Reports no questions or concerns at this time.   Expected Discharge Plan: Skilled Nursing Facility Barriers to Discharge: Continued Medical Work up   Patient Goals and CMS Choice        Expected Discharge Plan and Services Expected Discharge Plan: Skilled Nursing Facility       Living arrangements for the past 2 months: Skilled Nursing Facility [facility])                                      Prior Living Arrangements/Services Living arrangements for the past 2 months: Skilled Nursing Facility ([place]) Lives with:: Facility Resident   Do you feel safe going back to the place where you live?: Yes      Need for Family Participation in Patient Care: Yes (Comment) Care giver support system in place?: Yes (comment)      Activities of Daily Living Home Assistive Devices/Equipment: None ADL Screening (condition at time of admission) Patient's cognitive ability adequate to safely complete daily activities?: No Is the patient deaf or have difficulty hearing?: No Does the patient have difficulty seeing, even when wearing glasses/contacts?: No Does the patient have difficulty concentrating, remembering, or making decisions?: Yes Patient able to express need for assistance with ADLs?: Yes Does the patient have  difficulty dressing or bathing?: Yes Independently performs ADLs?: No Communication: Independent Dressing (OT): Dependent Is this a change from baseline?: Pre-admission baseline Grooming: Dependent Is this a change from baseline?: Pre-admission baseline Feeding: Dependent Is this a change from baseline?: Pre-admission baseline Bathing: Dependent Is this a change from baseline?: Pre-admission baseline Toileting: Dependent Is this a change from baseline?: Pre-admission baseline In/Out Bed: Dependent Is this a change from baseline?: Pre-admission baseline Walks in Home: Dependent Is this a change from baseline?: Pre-admission baseline Does the patient have difficulty walking or climbing stairs?: Yes Weakness of Legs: Both Weakness of Arms/Hands: Both  Permission Sought/Granted Permission sought to share information with : Case Manager,Facility Contact Representative,Family Supports Permission granted to share information with : Yes, Verbal Permission Granted  Share Information with NAME: Ronne Binning  Permission granted to share info w AGENCY: SNFs  Permission granted to share info w Relationship: husband  Permission granted to share info w Contact Information: [phone]  Emotional Assessment       Orientation: : Oriented to Self,Fluctuating Orientation (Suspected and/or reported Sundowners) (advanced dementia) Alcohol / Substance Use: Not Applicable Psych Involvement: No (comment)  Admission diagnosis:  Fecal impaction (HCC) [K56.41] Megacolon [K59.39] Tachycardia [R00.0] Lower abdominal pain [R10.30] Patient Active Problem List   Diagnosis Date Noted  . Pressure injury of skin 07/23/2020  . Megacolon 07/22/2020  . Obesity, diabetes, and hypertension syndrome (HCC)   . Vaginal bleeding   .  Acute urinary retention   . Acute metabolic encephalopathy   . Rectal bleeding   . Weakness   . Lethargy   . History of completed stroke   . GI bleeding 12/27/2019  . Lower GI bleed  12/27/2019  . C. difficile colitis 12/14/2019  . Acute colitis 12/12/2019  . Decubitus ulcer of coccyx, stage 2 (HCC) 04/28/2016  . Acute GI bleeding 04/27/2016  . Acute blood loss anemia   . Blood in stool   . Gastritis without bleeding   . Involuntary movements   . Acute respiratory failure (HCC) 03/05/2016  . Acute encephalopathy 02/06/2016  . Acute cerebrovascular accident (CVA) (HCC)   . Altered mental status   . 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 Cellulitis of left lower extremity 11/19/2014  . COPD (chronic obstructive pulmonary disease) (HCC) 11/19/2014  . Essential hypertension 11/19/2014  . HLD (hyperlipidemia) 11/19/2014  . Sepsis (HCC) 11/19/2014  . Type 2 diabetes mellitus with hyperlipidemia (HCC) 05/21/2014  . Chronic anemia 05/21/2014   PCP:  Ethelda Chick, MD Pharmacy:   [facility] [place] - [address] [place] [address] Phone: [phone] Fax: [phone]  [facility] [address], [place] - [address] [place] [address] Phone: [phone] Fax: [phone]     Social Determinants of Health (SDOH) Interventions    Readmission Risk Interventions Readmission Risk Prevention Plan 12/13/2019  Transportation Screening Complete  Palliative Care Screening Not Applicable  Medication Review (RN Care Manager) Complete  Some recent data might be hidden

## 2020-07-23 NOTE — Progress Notes (Signed)
PROGRESS NOTE    Molly [name]  KGU:[id number] DOB: [birth year] DOA: 07/22/2020 PCP: Molly Chick, MD   HPI was taken from Dr. Sedalia Muta: Molly [name] is a 75 y.o. female with medical history significant for hyperlipidemia, depression, anxiety, CVA with left-sided residual weakness, insulin-dependent mellitus, dementia, history hypertension, GERD, presents to the emergency department for chief concerns of abdominal pain.  Patient states that her abdominal pain and discomfort has been ongoing for the past 3 days.  She states that the facility told her that she had a bowel movement in the a.m. prior to being transported to the emergency department.  Patient is not able to tell me if the bowel movement is watery or well-formed.  Social history: Patient lives at home in a facility.  Vaccination: Unknown vaccination history  ROS: Unable to complete due to advanced dementia  ED Course: Discussed with ED provider, patient requiring hospitalization for severe constipation.  Vitals in the emergency department is remarkable for temperature of 97.6, respiration rate of 13, heart rate 89, blood pressure 102/70, SPO2 of 96% on room air.  Patient is status post morphine 4 mg IV once, ondansetron 4 mg IV once, LR bolus 1 L per ED provider.  Labs in the emergency department was remarkable for lactic acid of 2.6 improved to 2.5, respiratory panel was negative including COVID PCR, influenza a and B, nonfasting blood glucose was 147, albumin 3.2.  EDP attempted digital rectal disimpaction with minimal stool output.  EDP ordered smog enema and oral GlycoLax.   Assessment & Plan:   Principal Problem:   Megacolon Active Problems:   Type 2 diabetes mellitus with hyperlipidemia (HCC)   COPD (chronic obstructive pulmonary disease) (HCC)   Essential hypertension   HLD (hyperlipidemia)   Gastritis without bleeding   Lethargy   Pressure injury of skin   Severe constipation: w/ initial  concern of toxic megacolon. General surg consulted but does not believe there is concern for toxic megacolon.  Has a several bowel movements today. Continue on IVFs. Continue on dulcolax, mg citrate as per general surg  Possible UTI: UA is positive. Urine cx ordered but likely will show no growth as urine cx was not collected prior to giving abxs. Continue on IV rocephin   QT prolongation: continue to monitor on tele  HLD: continue on statin   DM2: likely poorly controlled. Continue on SSI w/ accuchecks  Depression: severity unknown. Continue on home dose of amitriptyline  Hx of CVA: w/ residual left sided hemiparesis. Continue w/ supportive care  COPD: w/o exacerbation. Continue on bronchodilators and encourage incentive spirometry    DVT prophylaxis: lovenox Code Status: full  Family Communication:  Disposition Plan: likely back to home facility  Level of care: Progressive Cardiac   Status is: Observation  The patient remains OBS appropriate and will d/c before 2 midnights.  Dispo: The patient is from: SNF              Anticipated d/c is to: SNF              Patient currently is not medically stable to d/c.   Difficult to place patient No    Consultants:   General surg    Procedures:    Antimicrobials:    Subjective: Pt c/o abd pain   Objective: Vitals:   07/22/20 1900 07/22/20 2044 07/23/20 0314 07/23/20 0747  BP: 119/60 115/66 122/69 132/77  Pulse: 79 82 87 90  Resp: 12 18 17 18   Temp:  )  97.5 F (36.4 C) 98.6 F (37 C) 97.9 F (36.6 C)  TempSrc:  Oral  Oral  SpO2: 96% 95% 95% 95%  Weight:   73.3 kg   Height:        Intake/Output Summary (Last 24 hours) at 07/23/2020 0749 Last data filed at 07/23/2020 0300 Gross per 24 hour  Intake 3771.33 ml  Output --  Net 3771.33 ml   Filed Weights   07/22/20 0958 07/23/20 0314  Weight: 79.4 kg 73.3 kg    Examination:  General exam: Appears comfortable  Respiratory system: Clear to auscultation.  Respiratory effort normal. Cardiovascular system: S1 & S2 +. No rubs, gallops or clicks.  Gastrointestinal system: Abdomen is distended, soft and tender.  Hyperactive bowel sounds heard. Central nervous system: Alert and awake. Moves all extremities  Psychiatry: Judgement and insight appear abnormal. Mood & affect appropriate.     Data Reviewed: I have personally reviewed following labs and imaging studies  CBC: Recent Labs  Lab 07/22/20 1005 07/23/20 0552  WBC 9.5 7.4  HGB 12.1 11.1*  HCT 38.6 35.8*  MCV 86.9 89.3  PLT 355 274   Basic Metabolic Panel: Recent Labs  Lab 07/22/20 1005 07/23/20 0552  NA 136 138  K 3.9 4.0  CL 98 101  CO2 29 29  GLUCOSE 147* 74  BUN 17 11  CREATININE 0.62 0.46  CALCIUM 8.9 8.9   GFR: Estimated Creatinine Clearance: 60.5 mL/min (by C-G formula based on SCr of 0.46 mg/dL). Liver Function Tests: Recent Labs  Lab 07/22/20 1005  AST 20  ALT 11  ALKPHOS 58  BILITOT 0.8  PROT 7.3  ALBUMIN 3.2*   Recent Labs  Lab 07/22/20 1005  LIPASE 21   No results for input(s): AMMONIA in the last 168 hours. Coagulation Profile: No results for input(s): INR, PROTIME in the last 168 hours. Cardiac Enzymes: No results for input(s): CKTOTAL, CKMB, CKMBINDEX, TROPONINI in the last 168 hours. BNP (last 3 results) No results for input(s): PROBNP in the last 8760 hours. HbA1C: Recent Labs    07/22/20 1344  HGBA1C 6.0*   CBG: Recent Labs  Lab 07/22/20 1804 07/22/20 1817 07/22/20 2059 07/22/20 2137 07/22/20 2243  GLUCAP 43* 195* 61* 112* 87   Lipid Profile: No results for input(s): CHOL, HDL, LDLCALC, TRIG, CHOLHDL, LDLDIRECT in the last 72 hours. Thyroid Function Tests: No results for input(s): TSH, T4TOTAL, FREET4, T3FREE, THYROIDAB in the last 72 hours. Anemia Panel: No results for input(s): VITAMINB12, FOLATE, FERRITIN, TIBC, IRON, RETICCTPCT in the last 72 hours. Sepsis Labs: Recent Labs  Lab 07/22/20 1005 07/22/20 1205   LATICACIDVEN 2.6* 2.5*    Recent Results (from the past 240 hour(s))  Resp Panel by RT-PCR (Flu A&B, Covid) Nasopharyngeal Swab     Status: None   Collection Time: 07/22/20 10:30 AM   Specimen: Nasopharyngeal Swab; Nasopharyngeal(NP) swabs in vial transport medium  Result Value Ref Range Status   SARS Coronavirus 2 by RT PCR NEGATIVE NEGATIVE Final    Comment: (NOTE) SARS-CoV-2 target nucleic acids are NOT DETECTED.  The SARS-CoV-2 RNA is generally detectable in upper respiratory specimens during the acute phase of infection. The lowest concentration of SARS-CoV-2 viral copies this assay can detect is 138 copies/mL. A negative result does not preclude SARS-Cov-2 infection and should not be used as the sole basis for treatment or other patient management decisions. A negative result may occur with  improper specimen collection/handling, submission of specimen other than nasopharyngeal swab, presence of viral mutation(s)  within the areas targeted by this assay, and inadequate number of viral copies(<138 copies/mL). A negative result must be combined with clinical observations, patient history, and epidemiological information. The expected result is Negative.  Fact Sheet for Patients:  BloggerCourse.comhttps://www.fda.gov/media/152166/download  Fact Sheet for Healthcare Providers:  SeriousBroker.ithttps://www.fda.gov/media/152162/download  This test is no t yet approved or cleared by the [place] FDA and  has been authorized for detection and/or diagnosis of SARS-CoV-2 by FDA under an Emergency Use Authorization (EUA). This EUA will remain  in effect (meaning this test can be used) for the duration of the COVID-19 declaration under Section 564(b)(1) of the Act, 21 U.S.C.section 360bbb-3(b)(1), unless the authorization is terminated  or revoked sooner.       Influenza A by PCR NEGATIVE NEGATIVE Final   Influenza B by PCR NEGATIVE NEGATIVE Final    Comment: (NOTE) The Xpert Xpress SARS-CoV-2/FLU/RSV  plus assay is intended as an aid in the diagnosis of influenza from Nasopharyngeal swab specimens and should not be used as a sole basis for treatment. Nasal washings and aspirates are unacceptable for Xpert Xpress SARS-CoV-2/FLU/RSV testing.  Fact Sheet for Patients: BloggerCourse.comhttps://www.fda.gov/media/152166/download  Fact Sheet for Healthcare Providers: SeriousBroker.ithttps://www.fda.gov/media/152162/download  This test is not yet approved or cleared by the [place] FDA and has been authorized for detection and/or diagnosis of SARS-CoV-2 by FDA under an Emergency Use Authorization (EUA). This EUA will remain in effect (meaning this test can be used) for the duration of the COVID-19 declaration under Section 564(b)(1) of the Act, 21 U.S.C. section 360bbb-3(b)(1), unless the authorization is terminated or revoked.  Performed at [facility], [address] [place], [place] [address]   CULTURE, BLOOD (ROUTINE X 2) w Reflex to ID Panel     Status: None (Preliminary result)   Collection Time: 07/22/20  1:32 PM   Specimen: BLOOD  Result Value Ref Range Status   Specimen Description BLOOD  RIGHT UPPER ARM  Final   Special Requests   Final    BOTTLES DRAWN AEROBIC AND ANAEROBIC Blood Culture results may not be optimal due to an excessive volume of blood received in culture bottles   Culture   Final    NO GROWTH < 24 HOURS Performed at [facility], [address] [place], [place] [address]    Report Status PENDING  Incomplete  CULTURE, BLOOD (ROUTINE X 2) w Reflex to ID Panel     Status: None (Preliminary result)   Collection Time: 07/22/20  1:37 PM   Specimen: BLOOD  Result Value Ref Range Status   Specimen Description BLOOD BLOOD RIGHT FOREARM  Final   Special Requests   Final    BOTTLES DRAWN AEROBIC AND ANAEROBIC Blood Culture results may not be optimal due to an excessive volume of blood received in culture bottles   Culture   Final    NO GROWTH < 24 HOURS Performed at  [facility], [address] [place], [place] [address]    Report Status PENDING  Incomplete         Radiology Studies: DG Abd 1 View  Result Date: 07/23/2020 CLINICAL DATA:  Megacolon. EXAM: ABDOMEN - 1 VIEW COMPARISON:  07/22/2020.  CT 07/22/2020. FINDINGS: Oral contrast in the right colon. Interim improvement of rectal distention with large amount of stool remaining in the rectum. No free air. Visceral vascular calcification. Degenerative change lumbar spine. Degenerative changes both hips. IMPRESSION: Interim improvement in rectal distention with large amount of stool remaining in the rectum. Electronically Signed  ByMaisie Fus  Register   On: 07/23/2020 07:12   CT ABDOMEN PELVIS W CONTRAST  Result Date: 07/22/2020 CLINICAL DATA:  75 year old female with abdominal pain and distension. EXAM: CT ABDOMEN AND PELVIS WITH CONTRAST TECHNIQUE: Multidetector CT imaging of the abdomen and pelvis was performed using the standard protocol following bolus administration of intravenous contrast. CONTRAST:  OMNIPAQUE IOHEXOL 300 MG/ML  SOLN COMPARISON:  CT Abdomen and Pelvis 12/27/2019 and earlier. FINDINGS: Lower chest: Stable cardiac size at the upper limits of normal. No pericardial or pleural effusion. Negative lung bases. Calcified coronary artery atherosclerosis. Hepatobiliary: Stable liver and gallbladder. Pancreas: Stable atrophy. Spleen: Stable, negative. Adrenals/Urinary Tract: Normal adrenal glands. There were no convincing renal calculi in September, such that renal collecting system density today is favored to be early contrast excretion. And otherwise both kidneys appears stable. Nondilated ureters. Mass effect on the urinary bladder is secondary to the abnormal rectosigmoid colon. The bladder remains relatively diminutive. Stomach/Bowel: Severe volume of retained stool in the rectosigmoid colon (sagittal image 62) with dilated gas-filled redundant sigmoid and additional retained  stool in the upstream descending colon. The sigmoid measures up to 13.8 cm diameter. But there is no volvulus or transition point. Decompressed splenic flexure where wall thickening may be due to under distension. Redundant gas and stool-filled transverse and right colon also. Decompressed and negative terminal ileum. Appendix remains normal on coronal image 63. No dilated small bowel. Oral contrast has not yet reached the TI. Negative stomach and duodenum. No free air. No definite free fluid. Vascular/Lymphatic: Aortoiliac calcified atherosclerosis. Major arterial structures remain patent. Portal venous system is patent. Reproductive: Negative aside from mass effect by the enlarged rectosigmoid colon. Other: No pelvic free fluid. Presacral stranding appears stable or mildly improved from September. Musculoskeletal: Advanced degeneration in the spine. Levels of degenerative ankylosis. Chronic pubic rami fractures. No acute osseous abnormality identified. IMPRESSION: 1. Severe distal Fecal Impaction with suggestion of superimposed generalized large bowel dysmotility. 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 2. No perforation or abscess. Mass effect on the urinary bladder. Normal appendix and small bowel. 3. No other acute or inflammatory process identified in the abdomen or pelvis. Aortic Atherosclerosis (ICD10-I70.0). Electronically Signed   By: Odessa Fleming M.D.   On: 07/22/2020 11:48   DG Chest Port 1 View  Result Date: 07/22/2020 CLINICAL DATA:  Tachycardia.  Abdominal pain. EXAM: PORTABLE CHEST 1 VIEW COMPARISON:  Chest x-ray dated December 11, 2019. FINDINGS: The heart size and mediastinal contours are within normal limits. Both lungs are clear. The visualized skeletal structures are unremarkable. IMPRESSION: No active disease. Electronically Signed   By: Obie Dredge M.D.   On: 07/22/2020 10:54   DG Abd 2 Views  Result Date: 07/22/2020 CLINICAL DATA:   75 year old female with abdominal pain and distension EXAM: ABDOMEN - 2 VIEW COMPARISON:  Abdominal radiograph dated 05/07/2015 and CT dated 07/22/2020. FINDINGS: Evaluation is limited due to body habitus. There is severe distension of the rectal vault measuring 18 cm in diameter. There is diffusely distended colon. No dilatation of the small bowel. Contrast noted in several small bowel loops. There is oral contrast in the terminal ileum and cecum. No definite free air. Degenerative changes of the spine. IMPRESSION: Diffuse colonic distension with severe distension of the rectal vault, significantly progressed since the earlier CT. No obvious free air. Electronically Signed   By: Elgie Collard M.D.   On: 07/22/2020 19:20  Scheduled Meds: . amitriptyline  50 mg Oral QHS  . atorvastatin  20 mg Oral QHS  . bisacodyl  10 mg Oral Once  . insulin aspart  0-15 Units Subcutaneous TID WC  . linagliptin  5 mg Oral Daily  . magnesium citrate  1 Bottle Oral Once   Continuous Infusions: . cefTRIAXone (ROCEPHIN)  IV Stopped (07/22/20 1856)  . lactated ringers 150 mL/hr at 07/23/20 0004     LOS: 0 days    Time spent: 33 mins     Charise Killian, MD [facility] Pager [phone]  If 7PM-7AM, please contact night-coverage 07/23/2020, 7:49 AM

## 2020-07-23 NOTE — Plan of Care (Signed)

## 2020-07-23 NOTE — Evaluation (Signed)
Physical Therapy Evaluation Patient Details Name: Molly [name] MRN: [id number] DOB: 05-25-1945 Today's Date: 07/23/2020   History of Present Illness  Pt is a 75 y.o. female with medical history significant for hyperlipidemia, depression, anxiety, CVA with left-sided residual weakness, insulin-dependent mellitus, dementia, history hypertension, GERD, presents to the emergency department for chief concerns of abdominal pain. MD assessment includes: Severe constipation, possible UTI, and QT prolongation.    Clinical Impression  Pt was pleasant and motivated to participate during the session.  Pt was alert to self only but was able to follow commands and put forth good effort with task but ultimately was very limited functionally.  Pt required +2 mod to max assist with sup to/from sit and once in sitting at the EOB required near constant min to mod A to prevent R posterio-lateral LOB.  Multiple attempts made to come to standing with +2 mod to max A but pt was unable to clear the surface of the bed and generated little to no force through her LE's when attempting to stand.  Multiple attempts made to contact caregivers at the pt's SNF in order to get a better picture of the pt's baseline functional mobility but was unable to reach anyone familiar with the pt. Pt's spouse also contacted but was unsure of the details of pt's baseline function.  Pt will benefit from a trial of PT services in a SNF setting upon discharge to safely address deficits listed in patient problem list for decreased caregiver assistance and eventual return to PLOF but may be more appropriate for LTC.       Follow Up Recommendations SNF;Supervision/Assistance - 24 hour    Equipment Recommendations  None recommended by PT    Recommendations for Other Services       Precautions / Restrictions Precautions Precautions: Fall Restrictions Weight Bearing Restrictions: No      Mobility  Bed Mobility Overal bed mobility:  Needs Assistance Bed Mobility: Supine to Sit;Sit to Supine     Supine to sit: Mod assist;+2 for physical assistance;Max assist Sit to supine: Mod assist;+2 for physical assistance;Max assist   General bed mobility comments: +2  Mod to max A for bed mobility tasks for LLE and trunk control    Transfers                 General transfer comment: Pt unable to stand even with +2 mod to max A; little to no force generated by pt's BLE's during transfer attempts  Ambulation/Gait             General Gait Details: Unable/unsafe to attempt  Stairs            Wheelchair Mobility    Modified Rankin (Stroke Patients Only)       Balance Overall balance assessment: Needs assistance Sitting-balance support: Feet supported;Single extremity supported Sitting balance-Leahy Scale: Poor Sitting balance - Comments: R posterio-lateral lean in sitting requiring min to mod A to prevent LOB Postural control: Posterior lean;Right lateral lean     Standing balance comment: Unable to stand                             Pertinent Vitals/Pain Pain Assessment: No/denies pain    Home Living Family/patient expects to be discharged to:: Skilled nursing facility                 Additional Comments: [facility]    Prior Function  Comments: Unable to reach caregiver at [facility] despite multiple attempts.  Spoke to spouse via phone call but spouse unable to provide details of pt's functional mobility at her SNF. Pt unable to provide history secondary to cognitive deficits.     Hand Dominance   Dominant Hand: Right    Extremity/Trunk Assessment   Upper Extremity Assessment Upper Extremity Assessment: Generalized weakness;LUE deficits/detail LUE Deficits / Details: Chronic LUE weakness and ROM deficits from CVA    Lower Extremity Assessment Lower Extremity Assessment: Generalized weakness;LLE deficits/detail LLE Deficits / Details:  Chronic LLE weakness from CVA       Communication      Cognition Arousal/Alertness: Awake/alert Behavior During Therapy: WFL for tasks assessed/performed Overall Cognitive Status: No family/caregiver present to determine baseline cognitive functioning                                 General Comments: Pt alert to self only but able to follow simple 1-step commands with extra time and cuing      General Comments      Exercises     Assessment/Plan    PT Assessment Patient needs continued PT services  PT Problem List Decreased strength;Decreased activity tolerance;Decreased balance;Decreased mobility;Decreased knowledge of use of DME       PT Treatment Interventions DME instruction;Gait training;Functional mobility training;Therapeutic activities;Therapeutic exercise;Balance training;Patient/family education    PT Goals (Current goals can be found in the Care Plan section)  Acute Rehab PT Goals PT Goal Formulation: Patient unable to participate in goal setting Time For Goal Achievement: 08/05/20 Potential to Achieve Goals: Fair    Frequency Min 2X/week   Barriers to discharge        Co-evaluation               AM-PAC PT "6 Clicks" Mobility  Outcome Measure Help needed turning from your back to your side while in a flat bed without using bedrails?: A Lot Help needed moving from lying on your back to sitting on the side of a flat bed without using bedrails?: A Lot Help needed moving to and from a bed to a chair (including a wheelchair)?: Total Help needed standing up from a chair using your arms (e.g., wheelchair or bedside chair)?: Total Help needed to walk in hospital room?: Total Help needed climbing 3-5 steps with a railing? : Total 6 Click Score: 8    End of Session Equipment Utilized During Treatment: Gait belt Activity Tolerance: Patient tolerated treatment well Patient left: in bed;with call bell/phone within reach;with bed alarm set;with  nursing/sitter in room Nurse Communication: Mobility status PT Visit Diagnosis: Muscle weakness (generalized) (M62.81);Difficulty in walking, not elsewhere classified (R26.2)    Time: 8270-7867 PT Time Calculation (min) (ACUTE ONLY): 31 min   Charges:   PT Evaluation $PT Eval Moderate Complexity: 1 Mod         D. Scott Shelvia Fojtik PT, DPT 07/23/20, 5:17 PM

## 2020-07-23 NOTE — Progress Notes (Addendum)
[facility] SURGICAL PROGRESS NOTE (cpt [phone])  Hospital Day(s): 0.   Interval History:  Patient seen and examined She is more lucid but talking about "using radioactive catheters to link her blood" and pushing buttons on the IV pump that do not exist Interestingly, she is very aware that she was markedly distended and impacted and that she has made significant improvement since having a bowel movement yesterday No abdominal pain Her labs remain reassuring  Review of Systems:  Unable to reliably preform secondary to mental status change  Vital signs in last 24 hours: [min-max] current  Temp:  [97.5 F (36.4 C)-98.6 F (37 C)] 98.6 F (37 C) (04/26 0314) Pulse Rate:  [79-117] 87 (04/26 0314) Resp:  [11-23] 17 (04/26 0314) BP: (102-133)/(58-85) 122/69 (04/26 0314) SpO2:  [94 %-98 %] 95 % (04/26 0314) Weight:  [73.3 kg-79.4 kg] 73.3 kg (04/26 0314)     Height: 5\' 4"  (162.6 cm) Weight: 73.3 kg BMI (Calculated): 27.74   Intake/Output last 2 shifts:  04/25 0701 - 04/26 0700 In: 3771.3 [I.V.:1671.3; IV Piggyback:2100] Out: -    Physical Exam:  Constitutional: alert, cooperative and no distress  HENT: normocephalic without obvious abnormality, wearing mask Eyes: PERRL, EOM's grossly intact and symmetric  Respiratory: breathing non-labored at rest  Cardiovascular: regular rate and sinus rhythm  Gastrointestinal: Abdomen is markedly improved this morning, she is non-tender, previous distension improved significantly, no rebound/guarding Musculoskeletal: no edema or wounds, motor and sensation grossly intact, NT    Labs:  CBC Latest Ref Rng & Units 07/23/2020 07/22/2020 01/25/2020  WBC 4.0 - 10.5 K/uL 7.4 9.5 6.8  Hemoglobin 12.0 - 15.0 g/dL 11.1(L) 12.1 10.8(L)  Hematocrit 36.0 - 46.0 % 35.8(L) 38.6 34.6(L)  Platelets 150 - 400 K/uL 274 355 280   CMP Latest Ref Rng & Units 07/23/2020 07/22/2020 01/03/2020  Glucose 70 - 99 mg/dL 74 03/04/2020) 735(H)  BUN 8 - 23 mg/dL 11  17 11   Creatinine 0.44 - 1.00 mg/dL 299(M 4.26  Sodium 135 - 145 mmol/L 138 136 134(L)  Potassium 3.5 - 5.1 mmol/L 4.0 3.9 4.3  Chloride 98 - 111 mmol/L 101 98 97(L)  CO2 22 - 32 mmol/L 29 29 29   Calcium 8.9 - 10.3 mg/dL 8.9 8.9 8.9  Total Protein 6.5 - 8.1 g/dL - 7.3 -  Total Bilirubin 0.3 - 1.2 mg/dL - 0.8 -  Alkaline Phos 38 - 126 U/L - 58 -  AST 15 - 41 U/L - 20 -  ALT 0 - 44 U/L - 11 -     Imaging studies:   KUB (07/23/2020) personally reviewed showing improvement in colonic distension, still some stool dep in the rectum, and radiologist report reviewed:  IMPRESSION: Interim improvement in rectal distention with large amount of stool remaining in the rectum.   Assessment/Plan: (ICD-10's: K68.41) 75 y.o. female with clinical improvement s/p aggressive bowel regiment admitted with may be chronic colonic dilation with significant distal stool burden, complicated by pertinent comorbidities including CVA with left-sided deficits, HTN, HLD, DM, and debilitation.   - She has made significant clinical improvement with aggressive bowel regimen from above and below. This should be continued  - No surgical intervention warranted at this time  - Again, as Dr 07/25/2020 mentioned in original consultation, there is some risk of stercoral perforation however she is a very poor surgical candidate and would not be able to tolerate intervention such as total colectomy.    - Okay to initiate diet from surgical perspective  -  Monitor abdominal examination; on-going bowel fucntion   - Further management per primary service   - general surgery will sign off; no surgical issues. Continue aggressive bowel regimen at home   All of the above findings and recommendations were discussed with the patient, and the medical team, and all of patient's questions were answered to her expressed satisfaction.  -- Lynden Oxford, PA-C [facility] 07/23/2020, 7:02 AM [phone] M-F: 7am -  4pm  I personally reviewed the patient's imaging, demonstrating significant improvement in the colonic dilation.  I agree that she should continue on a very structured bowel regimen, which will need to be followed at her living facility as well.  No surgical intervention is indicated.

## 2020-07-24 ENCOUNTER — Other Ambulatory Visit: Payer: Self-pay | Admitting: Obstetrics and Gynecology

## 2020-07-24 DIAGNOSIS — K5641 Fecal impaction: Secondary | ICD-10-CM | POA: Diagnosis present

## 2020-07-24 DIAGNOSIS — K5904 Chronic idiopathic constipation: Secondary | ICD-10-CM

## 2020-07-24 DIAGNOSIS — I10 Essential (primary) hypertension: Secondary | ICD-10-CM | POA: Diagnosis not present

## 2020-07-24 DIAGNOSIS — Z794 Long term (current) use of insulin: Secondary | ICD-10-CM | POA: Diagnosis not present

## 2020-07-24 DIAGNOSIS — F419 Anxiety disorder, unspecified: Secondary | ICD-10-CM | POA: Diagnosis not present

## 2020-07-24 DIAGNOSIS — K2970 Gastritis, unspecified, without bleeding: Secondary | ICD-10-CM | POA: Diagnosis not present

## 2020-07-24 DIAGNOSIS — J449 Chronic obstructive pulmonary disease, unspecified: Secondary | ICD-10-CM | POA: Diagnosis not present

## 2020-07-24 DIAGNOSIS — K219 Gastro-esophageal reflux disease without esophagitis: Secondary | ICD-10-CM | POA: Diagnosis not present

## 2020-07-24 DIAGNOSIS — I69354 Hemiplegia and hemiparesis following cerebral infarction affecting left non-dominant side: Secondary | ICD-10-CM | POA: Diagnosis not present

## 2020-07-24 DIAGNOSIS — I4510 Unspecified right bundle-branch block: Secondary | ICD-10-CM | POA: Diagnosis not present

## 2020-07-24 DIAGNOSIS — K5900 Constipation, unspecified: Secondary | ICD-10-CM | POA: Diagnosis present

## 2020-07-24 DIAGNOSIS — E1169 Type 2 diabetes mellitus with other specified complication: Secondary | ICD-10-CM | POA: Diagnosis not present

## 2020-07-24 DIAGNOSIS — Z83438 Family history of other disorder of lipoprotein metabolism and other lipidemia: Secondary | ICD-10-CM | POA: Diagnosis not present

## 2020-07-24 DIAGNOSIS — K5939 Other megacolon: Principal | ICD-10-CM

## 2020-07-24 DIAGNOSIS — F0390 Unspecified dementia without behavioral disturbance: Secondary | ICD-10-CM | POA: Diagnosis not present

## 2020-07-24 DIAGNOSIS — Z7982 Long term (current) use of aspirin: Secondary | ICD-10-CM | POA: Diagnosis not present

## 2020-07-24 DIAGNOSIS — E785 Hyperlipidemia, unspecified: Secondary | ICD-10-CM | POA: Diagnosis not present

## 2020-07-24 DIAGNOSIS — Z87891 Personal history of nicotine dependence: Secondary | ICD-10-CM | POA: Diagnosis not present

## 2020-07-24 DIAGNOSIS — Z89422 Acquired absence of other left toe(s): Secondary | ICD-10-CM | POA: Diagnosis not present

## 2020-07-24 DIAGNOSIS — R8281 Pyuria: Secondary | ICD-10-CM | POA: Diagnosis not present

## 2020-07-24 DIAGNOSIS — Z20822 Contact with and (suspected) exposure to covid-19: Secondary | ICD-10-CM | POA: Diagnosis not present

## 2020-07-24 DIAGNOSIS — Z79899 Other long term (current) drug therapy: Secondary | ICD-10-CM | POA: Diagnosis not present

## 2020-07-24 DIAGNOSIS — E872 Acidosis: Secondary | ICD-10-CM | POA: Diagnosis not present

## 2020-07-24 DIAGNOSIS — F32A Depression, unspecified: Secondary | ICD-10-CM | POA: Diagnosis not present

## 2020-07-24 DIAGNOSIS — Z823 Family history of stroke: Secondary | ICD-10-CM | POA: Diagnosis not present

## 2020-07-24 DIAGNOSIS — L89151 Pressure ulcer of sacral region, stage 1: Secondary | ICD-10-CM | POA: Diagnosis not present

## 2020-07-24 LAB — BASIC METABOLIC PANEL
Anion gap: 9 (ref 5–15)
BUN: 8 mg/dL (ref 8–23)
CO2: 30 mmol/L (ref 22–32)
Calcium: 9 mg/dL (ref 8.9–10.3)
Chloride: 99 mmol/L (ref 98–111)
Creatinine, Ser: 0.47 mg/dL (ref 0.44–1.00)
GFR, Estimated: >60 mL/min (ref >60)
Glucose, Bld: 101 mg/dL — ABNORMAL HIGH (ref 70–99)
Potassium: 3.7 mmol/L (ref 3.5–5.1)
Sodium: 138 mmol/L (ref 135–145)

## 2020-07-24 LAB — CBC
HCT: 31.7 % — ABNORMAL LOW (ref 36.0–46.0)
Hemoglobin: 10 g/dL — ABNORMAL LOW (ref 12.0–15.0)
MCH: 27.2 pg (ref 26.0–34.0)
MCHC: 31.5 g/dL (ref 30.0–36.0)
MCV: 86.1 fL (ref 80.0–100.0)
Platelets: 322 10*3/uL (ref 150–400)
RBC: 3.68 MIL/uL — ABNORMAL LOW (ref 3.87–5.11)
RDW: 15.9 % — ABNORMAL HIGH (ref 11.5–15.5)
WBC: 6.6 10*3/uL (ref 4.0–10.5)
nRBC: 0 % (ref 0.0–0.2)

## 2020-07-24 LAB — GLUCOSE, CAPILLARY
Glucose-Capillary: 102 mg/dL — ABNORMAL HIGH (ref 70–99)
Glucose-Capillary: 104 mg/dL — ABNORMAL HIGH (ref 70–99)

## 2020-07-24 LAB — MAGNESIUM: Magnesium: 1.6 mg/dL — ABNORMAL LOW (ref 1.7–2.4)

## 2020-07-24 MED ORDER — LACTULOSE 10 GM/15ML PO SOLN: 20.0000 g | Freq: Every day | ORAL | Status: DC

## 2020-07-24 MED ORDER — POLYETHYLENE GLYCOL 3350 17 G PO PACK: 34.0000 g | PACK | Freq: Every day | ORAL | Status: DC

## 2020-07-24 MED ORDER — LACTULOSE 10 GM/15ML PO SOLN: 20.0000 g | Freq: Every day | ORAL | 0 refills | Status: DC

## 2020-07-24 MED ORDER — MAGNESIUM SULFATE 2 GM/50ML IV SOLN
2.0000 g | Freq: Once | INTRAVENOUS | Status: AC
  Administered 2020-07-24: 2 g via INTRAVENOUS
  Filled 2020-07-24: qty 50

## 2020-07-24 MED ORDER — SENNA 8.6 MG PO TABS: 2.0000 | ORAL_TABLET | Freq: Every day | ORAL | Status: DC

## 2020-07-24 MED ORDER — POLYETHYLENE GLYCOL 3350 17 G PO PACK: 17.0000 g | PACK | Freq: Every day | ORAL | Status: DC

## 2020-07-24 MED ORDER — BISACODYL 10 MG RE SUPP
10.0000 mg | Freq: Once | RECTAL | Status: AC
  Administered 2020-07-24: 10 mg via RECTAL
  Filled 2020-07-24: qty 1

## 2020-07-24 NOTE — Progress Notes (Addendum)
PROGRESS NOTE    Molly [name]  AYT:[id number] DOB: 07-12-1945 DOA: 07/22/2020 PCP: Ethelda Chick, MD   HPI was taken from Dr. Sedalia Muta: Molly [name] is a 75 y.o. female with medical history significant for hyperlipidemia, depression, anxiety, CVA with left-sided residual weakness, insulin-dependent mellitus, dementia, history hypertension, GERD, presents to the emergency department for chief concerns of abdominal pain.  Patient states that her abdominal pain and discomfort has been ongoing for the past 3 days.  She states that the facility told her that she had a bowel movement in the a.m. prior to being transported to the emergency department.  Patient is not able to tell me if the bowel movement is watery or well-formed.  Social history: Patient lives at home in a facility.  Vaccination: Unknown vaccination history  ROS: Unable to complete due to advanced dementia  ED Course: Discussed with ED provider, patient requiring hospitalization for severe constipation.  Vitals in the emergency department is remarkable for temperature of 97.6, respiration rate of 13, heart rate 89, blood pressure 102/70, SPO2 of 96% on room air.  Patient is status post morphine 4 mg IV once, ondansetron 4 mg IV once, LR bolus 1 L per ED provider.  Labs in the emergency department was remarkable for lactic acid of 2.6 improved to 2.5, respiratory panel was negative including COVID PCR, influenza a and B, nonfasting blood glucose was 147, albumin 3.2.  EDP attempted digital rectal disimpaction with minimal stool output.  EDP ordered smog enema and oral GlycoLax.   Assessment & Plan:   Principal Problem:   Megacolon Active Problems:   Type 2 diabetes mellitus with hyperlipidemia (HCC)   COPD (chronic obstructive pulmonary disease) (HCC)   Essential hypertension   HLD (hyperlipidemia)   Gastritis without bleeding   Lethargy   Pressure injury of skin   Severe constipation: w/ initial  concern of toxic megacolon. General surg consulted but does not believe there is concern for toxic megacolon and they have signed off. Has had multiple bowel movements and KUB yesterday shows interval improvement in stool burden. Abdomen this morning is soft and non-tender.  - cont miralax/senna/lactulose  Possible UTI: UA equivocal, no culture, no clear symptoms, will hold abx  QT prolongation: continue to monitor on tele  HLD: continue on statin   DM2: Glucose here wnl, continue SSI  Depression: severity unknown. Continue on home dose of amitriptyline  Hx of CVA: w/ residual left sided hemiparesis. Continue w/ supportive care  COPD: w/o exacerbation. Continue on bronchodilators and encourage incentive spirometry  Stage 1 sacral decub - nursing care    DVT prophylaxis: lovenox Code Status: full  Family Communication: brother updated telephonically (no answer when husband called) Disposition Plan: likely back to home facility possibly today  Level of care: Med-Surg   Status is: inpt  The patient will require care spanning > 2 midnights and should be moved to inpatient because: Inpatient level of care appropriate due to severity of illness  Dispo: The patient is from: SNF              Anticipated d/c is to: SNF              Patient currently is not medically stable to d/c.   Difficult to place patient No    Consultants:   General surg    Procedures:    Antimicrobials:    Subjective: Denies abdominal pain  Objective: Vitals:   07/23/20 1606 07/23/20 2035 07/24/20 0156 07/24/20 0530  BP: 112/81 (!) 144/67 (!) 147/62 (!) 118/57  Pulse: 65 80 82 85  Resp: 18 18 20 20   Temp: 97.6 F (36.4 C) 97.9 F (36.6 C) 97.6 F (36.4 C) 98.5 F (36.9 C)  TempSrc:  Oral Oral Oral  SpO2: 97% 97% 94% 98%  Weight:      Height:        Intake/Output Summary (Last 24 hours) at 07/24/2020 0730 Last data filed at 07/24/2020 07/26/2020 Gross per 24 hour  Intake 240 ml  Output  200 ml  Net 40 ml   Filed Weights   07/22/20 0958 07/23/20 0314 07/23/20 1143  Weight: 79.4 kg 73.3 kg 71.9 kg    Examination:  General exam: Appears comfortable  Respiratory system: Clear to auscultation. Respiratory effort normal. Cardiovascular system: S1 & S2 +. No rubs, gallops or clicks.  Gastrointestinal system: Abdomen is distended, soft and tender.  Normoactive bowel sounds Central nervous system: Alert and awake. Moves all extremities  Psychiatry: awake, responds to simple questions    Data Reviewed: I have personally reviewed following labs and imaging studies  CBC: Recent Labs  Lab 07/22/20 1005 07/23/20 0552 07/24/20 0529  WBC 9.5 7.4 6.6  HGB 12.1 11.1* 10.0*  HCT 38.6 35.8* 31.7*  MCV 86.9 89.3 86.1  PLT 355 274 322   Basic Metabolic Panel: Recent Labs  Lab 07/22/20 1005 07/23/20 0552 07/24/20 0529  NA 136 138 138  K 3.9 4.0 3.7  CL 98 101 99  CO2 29 29 30   GLUCOSE 147* 74 101*  BUN 17 11 8   CREATININE 0.62 0.46 0.47  CALCIUM 8.9 8.9 9.0  MG  --   --  1.6*   GFR: Estimated Creatinine Clearance: 60 mL/min (by C-G formula based on SCr of 0.47 mg/dL). Liver Function Tests: Recent Labs  Lab 07/22/20 1005  AST 20  ALT 11  ALKPHOS 58  BILITOT 0.8  PROT 7.3  ALBUMIN 3.2*   Recent Labs  Lab 07/22/20 1005  LIPASE 21   No results for input(s): AMMONIA in the last 168 hours. Coagulation Profile: No results for input(s): INR, PROTIME in the last 168 hours. Cardiac Enzymes: No results for input(s): CKTOTAL, CKMB, CKMBINDEX, TROPONINI in the last 168 hours. BNP (last 3 results) No results for input(s): PROBNP in the last 8760 hours. HbA1C: Recent Labs    07/22/20 1344 07/23/20 0552  HGBA1C 6.0* 5.7*   CBG: Recent Labs  Lab 07/23/20 0901 07/23/20 0933 07/23/20 1142 07/23/20 1649 07/23/20 2026  GLUCAP 62* 135* 95 79 133*   Lipid Profile: No results for input(s): CHOL, HDL, LDLCALC, TRIG, CHOLHDL, LDLDIRECT in the last 72  hours. Thyroid Function Tests: No results for input(s): TSH, T4TOTAL, FREET4, T3FREE, THYROIDAB in the last 72 hours. Anemia Panel: No results for input(s): VITAMINB12, FOLATE, FERRITIN, TIBC, IRON, RETICCTPCT in the last 72 hours. Sepsis Labs: Recent Labs  Lab 07/22/20 1005 07/22/20 1205  LATICACIDVEN 2.6* 2.5*    Recent Results (from the past 240 hour(s))  Resp Panel by RT-PCR (Flu A&B, Covid) Nasopharyngeal Swab     Status: None   Collection Time: 07/22/20 10:30 AM   Specimen: Nasopharyngeal Swab; Nasopharyngeal(NP) swabs in vial transport medium  Result Value Ref Range Status   SARS Coronavirus 2 by RT PCR NEGATIVE NEGATIVE Final    Comment: (NOTE) SARS-CoV-2 target nucleic acids are NOT DETECTED.  The SARS-CoV-2 RNA is generally detectable in upper respiratory specimens during the acute phase of infection. The lowest concentration of SARS-CoV-2 viral  copies this assay can detect is 138 copies/mL. A negative result does not preclude SARS-Cov-2 infection and should not be used as the sole basis for treatment or other patient management decisions. A negative result may occur with  improper specimen collection/handling, submission of specimen other than nasopharyngeal swab, presence of viral mutation(s) within the areas targeted by this assay, and inadequate number of viral copies(<138 copies/mL). A negative result must be combined with clinical observations, patient history, and epidemiological information. The expected result is Negative.  Fact Sheet for Patients:  BloggerCourse.com  Fact Sheet for Healthcare Providers:  SeriousBroker.it  This test is no t yet approved or cleared by the [place] FDA and  has been authorized for detection and/or diagnosis of SARS-CoV-2 by FDA under an Emergency Use Authorization (EUA). This EUA will remain  in effect (meaning this test can be used) for the duration of the COVID-19  declaration under Section 564(b)(1) of the Act, 21 U.S.C.section 360bbb-3(b)(1), unless the authorization is terminated  or revoked sooner.       Influenza A by PCR NEGATIVE NEGATIVE Final   Influenza B by PCR NEGATIVE NEGATIVE Final    Comment: (NOTE) The Xpert Xpress SARS-CoV-2/FLU/RSV plus assay is intended as an aid in the diagnosis of influenza from Nasopharyngeal swab specimens and should not be used as a sole basis for treatment. Nasal washings and aspirates are unacceptable for Xpert Xpress SARS-CoV-2/FLU/RSV testing.  Fact Sheet for Patients: BloggerCourse.com  Fact Sheet for Healthcare Providers: SeriousBroker.it  This test is not yet approved or cleared by the [place] FDA and has been authorized for detection and/or diagnosis of SARS-CoV-2 by FDA under an Emergency Use Authorization (EUA). This EUA will remain in effect (meaning this test can be used) for the duration of the COVID-19 declaration under Section 564(b)(1) of the Act, 21 U.S.C. section 360bbb-3(b)(1), unless the authorization is terminated or revoked.  Performed at [facility], [address] [place], [place] [address]   CULTURE, BLOOD (ROUTINE X 2) w Reflex to ID Panel     Status: None (Preliminary result)   Collection Time: 07/22/20  1:32 PM   Specimen: BLOOD  Result Value Ref Range Status   Specimen Description BLOOD  RIGHT UPPER ARM  Final   Special Requests   Final    BOTTLES DRAWN AEROBIC AND ANAEROBIC Blood Culture results may not be optimal due to an excessive volume of blood received in culture bottles   Culture   Final    NO GROWTH 2 DAYS Performed at [facility], [address], [place], [place] [address]    Report Status PENDING  Incomplete  CULTURE, BLOOD (ROUTINE X 2) w Reflex to ID Panel     Status: None (Preliminary result)   Collection Time: 07/22/20  1:37 PM   Specimen: BLOOD  Result Value Ref Range  Status   Specimen Description BLOOD BLOOD RIGHT FOREARM  Final   Special Requests   Final    BOTTLES DRAWN AEROBIC AND ANAEROBIC Blood Culture results may not be optimal due to an excessive volume of blood received in culture bottles   Culture   Final    NO GROWTH 2 DAYS Performed at [facility], [address] [place], [place] [address]    Report Status PENDING  Incomplete         Radiology Studies: DG Abd 1 View  Result Date: 07/23/2020 CLINICAL DATA:  Megacolon. EXAM: ABDOMEN - 1 VIEW COMPARISON:  07/22/2020.  CT 07/22/2020. FINDINGS:  Oral contrast in the right colon. Interim improvement of rectal distention with large amount of stool remaining in the rectum. No free air. Visceral vascular calcification. Degenerative change lumbar spine. Degenerative changes both hips. IMPRESSION: Interim improvement in rectal distention with large amount of stool remaining in the rectum. Electronically Signed   By: Maisie Fus  Register   On: 07/23/2020 07:12   CT ABDOMEN PELVIS W CONTRAST  Result Date: 07/22/2020 CLINICAL DATA:  75 year old female with abdominal pain and distension. EXAM: CT ABDOMEN AND PELVIS WITH CONTRAST TECHNIQUE: Multidetector CT imaging of the abdomen and pelvis was performed using the standard protocol following bolus administration of intravenous contrast. CONTRAST:  OMNIPAQUE IOHEXOL 300 MG/ML  SOLN COMPARISON:  CT Abdomen and Pelvis 12/27/2019 and earlier. FINDINGS: Lower chest: Stable cardiac size at the upper limits of normal. No pericardial or pleural effusion. Negative lung bases. Calcified coronary artery atherosclerosis. Hepatobiliary: Stable liver and gallbladder. Pancreas: Stable atrophy. Spleen: Stable, negative. Adrenals/Urinary Tract: Normal adrenal glands. There were no convincing renal calculi in September, such that renal collecting system density today is favored to be early contrast excretion. And otherwise both kidneys appears stable. Nondilated  ureters. Mass effect on the urinary bladder is secondary to the abnormal rectosigmoid colon. The bladder remains relatively diminutive. Stomach/Bowel: Severe volume of retained stool in the rectosigmoid colon (sagittal image 62) with dilated gas-filled redundant sigmoid and additional retained stool in the upstream descending colon. The sigmoid measures up to 13.8 cm diameter. But there is no volvulus or transition point. Decompressed splenic flexure where wall thickening may be due to under distension. Redundant gas and stool-filled transverse and right colon also. Decompressed and negative terminal ileum. Appendix remains normal on coronal image 63. No dilated small bowel. Oral contrast has not yet reached the TI. Negative stomach and duodenum. No free air. No definite free fluid. Vascular/Lymphatic: Aortoiliac calcified atherosclerosis. Major arterial structures remain patent. Portal venous system is patent. Reproductive: Negative aside from mass effect by the enlarged rectosigmoid colon. Other: No pelvic free fluid. Presacral stranding appears stable or mildly improved from September. Musculoskeletal: Advanced degeneration in the spine. Levels of degenerative ankylosis. Chronic pubic rami fractures. No acute osseous abnormality identified. IMPRESSION: 1. Severe distal Fecal Impaction with suggestion of superimposed generalized large bowel dysmotility. 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 2. No perforation or abscess. Mass effect on the urinary bladder. Normal appendix and small bowel. 3. No other acute or inflammatory process identified in the abdomen or pelvis. Aortic Atherosclerosis (ICD10-I70.0). Electronically Signed   By: Odessa Fleming M.D.   On: 07/22/2020 11:48   DG Chest Port 1 View  Result Date: 07/22/2020 CLINICAL DATA:  Tachycardia.  Abdominal pain. EXAM: PORTABLE CHEST 1 VIEW COMPARISON:  Chest x-ray dated December 11, 2019. FINDINGS: The heart  size and mediastinal contours are within normal limits. Both lungs are clear. The visualized skeletal structures are unremarkable. IMPRESSION: No active disease. Electronically Signed   By: Obie Dredge M.D.   On: 07/22/2020 10:54   DG Abd 2 Views  Result Date: 07/22/2020 CLINICAL DATA:  75 year old female with abdominal pain and distension EXAM: ABDOMEN - 2 VIEW COMPARISON:  Abdominal radiograph dated 05/07/2015 and CT dated 07/22/2020. FINDINGS: Evaluation is limited due to body habitus. There is severe distension of the rectal vault measuring 18 cm in diameter. There is diffusely distended colon. No dilatation of the small bowel. Contrast noted in several small bowel loops. There is oral contrast in the  terminal ileum and cecum. No definite free air. Degenerative changes of the spine. IMPRESSION: Diffuse colonic distension with severe distension of the rectal vault, significantly progressed since the earlier CT. No obvious free air. Electronically Signed   By: Elgie CollardArash  Radparvar M.D.   On: 07/22/2020 19:20        Scheduled Meds: . amitriptyline  50 mg Oral QHS  . atorvastatin  20 mg Oral QHS  . bisacodyl  10 mg Oral Once  . enoxaparin (LOVENOX) injection  40 mg Subcutaneous Q24H  . insulin aspart  0-15 Units Subcutaneous TID WC  . magnesium citrate  1 Bottle Oral Once   Continuous Infusions: . cefTRIAXone (ROCEPHIN)  IV 1 g (07/23/20 1801)     LOS: 0 days    Time spent: 35 mins     Silvano BilisNoah B Keeon Zurn, MD [facility]   If 7PM-7AM, please contact night-coverage 07/24/2020, 7:30 AM

## 2020-07-24 NOTE — Progress Notes (Signed)
Report given to Morrie Sheldon, LPN at [facility] & patient transported via EMS. IV taken out.

## 2020-07-24 NOTE — TOC Transition Note (Signed)
Transition of Care [facility]) - CM/SW Discharge Note   Patient Details  Name: Molly [name] MRN: [id number] Date of Birth: 1945/09/21  [facility] of [facility] CM/SW Contact:  Margarito Liner, LCSW Phone Number: 07/24/2020, 11:36 AM   Clinical Narrative:  Patient has orders to discharge back to [facility] today. RN will call report to [phone] (Room [address] C Wing). EMS transport has been arranged for 1:30. No further concerns. CSW signing off.   Final next level of care: Skilled Nursing Facility Barriers to Discharge: Barriers Resolved   Patient Goals and CMS Choice     Choice offered to / list presented to : NA  Discharge Placement   Existing PASRR number confirmed : 07/24/20          Patient chooses bed at: [facility] Patient to be transferred to facility by: EMS Name of family member notified: Anastasha Ortez Patient and family notified of of transfer: 07/24/20  Discharge Plan and Services                                     Social Determinants of Health (SDOH) Interventions     Readmission Risk Interventions Readmission Risk Prevention Plan 12/13/2019  Transportation Screening Complete  Palliative Care Screening Not Applicable  Medication Review (RN Care Manager) Complete  Some recent data might be hidden

## 2020-07-24 NOTE — Discharge Instructions (Signed)
Fecal Impaction  A fecal impaction is a large, firm amount of stool (feces) that will not pass out of the body. A fecal impaction usually happens in the rectum, which is in the end of the large intestine. A fecal impaction can block the large intestine and cause significant problems. What are the causes? This condition may be caused by anything that slows down bowel movements, including:  The long-term use of laxatives, which are medicines that help you have a bowel movement.  Constipation.  Pain in the rectum. Fecal impaction can occur if you avoid having bowel movements due to the pain. Pain in the rectum can result from a medical condition, such as hemorrhoids or anal fissures.  Narcotic pain-relieving medicines, such as methadone, morphine, or codeine.  Not drinking enough fluids.  Being inactive for a long period of time.  Diseases of the brain or nervous system that damage nerves that control the muscles of the intestines. This condition may also be caused by dementia. What are the signs or symptoms? Common symptoms of this condition include:  A sense of fullness in the rectum but being unable to pass stool.  Changes in bowel patterns. This may include going to the bathroom less often or not at all.  Not having a normal number of bowel movements.  Thin, watery discharge from the rectum.  Pain or cramps in the abdominal area. These often happen after meals. Other symptoms include:  Urinating often.  Nausea, vomiting, and dehydration.  Dizziness and confusion.  Rapid heartbeat.  Fever and sweating.  Changes in blood pressure.  Breathing problems. How is this diagnosed? This condition may be diagnosed based on your symptoms and an exam of your rectum. Sometimes, X-rays or lab tests are done to confirm the diagnosis and to check for other problems. How is this treated? This condition may be treated by:  Having your health care provider remove the stool using a  gloved finger.  Taking medicine.  Using a suppository or enema in the rectum to soften the stool, which can stimulate a bowel movement. Follow these instructions at home: Eating and drinking  Drink enough fluid to keep your urine pale yellow.  Eat foods that are high in fiber, such as beans, whole grains, and fresh fruits and vegetables.  If you begin to get constipated, increase the amount of fiber in your diet.   General instructions  Develop bowel habits. An example of a bowel habit is having a bowel movement right after breakfast every day. Be sure to give yourself enough time on the toilet. This may require using enemas, bowel softeners, or suppositories at home, as directed by your health care provider. It may also include using mineral oil or olive oil.  Do exercises as told by your health care provider.  Take over-the-counter and prescription medicines only as told by your health care provider.  Keep all follow-up visits as told by your health care provider. This is important. Contact a health care provider if you:  Have ongoing pain in your rectum.  Need to use an enema or a suppository more than 2 times a week.  Have rectal bleeding.  Continue to have problems. The problems may include not being able to go to the bathroom and having long-term constipation.  Have pain in your abdomen.  Have thin, pencil-like stools. Get help right away if:  You have black or tarry stools. Summary  A fecal impaction is a large, firm amount of stool (feces) that will  not pass out of the body. A fecal impaction can block the large intestine and cause significant problems.  This condition may be caused by anything that slows down bowel movements.  This condition may be treated by having your health care provider remove the stool using a gloved finger, taking medicine, or using a suppository or enema in the rectum to soften the stool.  Develop bowel habits and eat foods that are high  in fiber, such as beans, whole grains, and fresh fruits and vegetables.  Contact a health care provider if you continue to have problems. The problems may include not being able to go to the bathroom and having long-term constipation. This information is not intended to replace advice given to you by your health care provider. Make sure you discuss any questions you have with your health care provider. Document Revised: 09/13/2018 Document Reviewed: 09/13/2018 Elsevier Patient Education  2021 [employer].

## 2020-07-24 NOTE — TOC Progression Note (Signed)
Transition of Care [facility]) - Progression Note    Patient Details  Name: Molly [name] MRN: [id number] Date of Birth: 1945-11-12  [facility] CM/SW Contact  Margarito Liner, LCSW Phone Number: 07/24/2020, 8:50 AM  Clinical Narrative:  Per MD, patient is stable for discharge. [facility] is able to [facility] her back today. COVID done 4/25 so will not need a new one. Per SNF admissions coordinator, patient is total care at baseline.  Expected Discharge Plan: Skilled Nursing Facility Barriers to Discharge: Continued Medical Work up  Expected Discharge Plan and Services Expected Discharge Plan: Skilled Nursing Facility       Living arrangements for the past 2 months: Skilled Nursing Facility [facility] [place])                                       Social Determinants of Health (SDOH) Interventions    Readmission Risk Interventions Readmission Risk Prevention Plan 12/13/2019  Transportation Screening Complete  Palliative Care Screening Not Applicable  Medication Review (RN Care Manager) Complete  Some recent data might be hidden

## 2020-07-24 NOTE — Evaluation (Signed)
Occupational Therapy Evaluation Patient Details Name: Molly [name] MRN: [id number] DOB: Apr 12, 1945 Today's Date: 07/24/2020    History of Present Illness Pt is a 75 y.o. female with medical history significant for hyperlipidemia, depression, anxiety, CVA with left-sided residual weakness, insulin-dependent mellitus, dementia, history hypertension, GERD, presents to the emergency department for chief concerns of abdominal pain. MD assessment includes: Severe constipation, possible UTI, and QT prolongation.   Clinical Impression   Pt seen for OT treatment on this date. Upon arrival to room, pt asleep in bed with breakfast in room untouched. Following light touch, pt awoken and able to verbalize orientation to self, however remaining lethargic with eyes closed for duration of evaluation and pt only responding to ~20% of questions. Pt able to follow some 1-step commands following muilt-modal cues. Unable to obtain PLOF d/t pt's cognitive deficits, however chart review reveals that pt lives at [facility]. Plan to confirm PLOF from caregiver as able. Pt currently presents with impaired cognition and decreased strength and activity tolerance, requiring MIN A to wash face with washcloth and MAX A to don/doff socks at bed-level. Pt would benefit from additional skilled OT services to address deficits listed in patient problem list and maximize return to PLOF. Upon discharge, recommend trial of OT at SNF, however pt may be more appropriate for LTC.    Follow Up Recommendations  SNF    Equipment Recommendations  Other (comment) (defer to next level of care)       Precautions / Restrictions Precautions Precautions: Fall Restrictions Weight Bearing Restrictions: No      Mobility Bed Mobility               General bed mobility comments: deferred bed mobility d/t pt's level of alertness and poor command follow                         ADL either performed or assessed with  clinical judgement   ADL Overall ADL's : Needs assistance/impaired     Grooming: Wash/dry face;Minimal assistance;Cueing for sequencing;Bed level Grooming Details (indicate cue type and reason): Pt able to wash face with R UE, requiring MIN A for thoroughness             Lower Body Dressing: Maximal assistance;Bed level Lower Body Dressing Details (indicate cue type and reason): MAX A to don/doff socks                                  Pertinent Vitals/Pain Pain Assessment: 0-10 Pain Location: Pt indicating that she is in pain, but unable to localize or provide score d/t level of alertness Pain Intervention(s): Limited activity within patient's tolerance     Hand Dominance Right   Extremity/Trunk Assessment Upper Extremity Assessment Upper Extremity Assessment: Generalized weakness;LUE deficits/detail LUE Deficits / Details: Chronic LUE weakness and ROM deficits from CVA   Lower Extremity Assessment Lower Extremity Assessment: Generalized weakness;LLE deficits/detail LLE Deficits / Details: Chronic LLE weakness from CVA          Cognition Arousal/Alertness: Lethargic Behavior During Therapy: Flat affect Overall Cognitive Status: No family/caregiver present to determine baseline cognitive functioning                                 General Comments: Pt alert to self. Pt lethargic with eyes remaining closed throughout session,  however responding to ~20% of questions and able to follow some 1-step commands following muilt-modal cues              Home Living Family/patient expects to be discharged to:: Skilled nursing facility                                 Additional Comments: [facility]      Prior Functioning/Environment          Comments: Pt unable to provide history secondary to cognitive deficits. Plan to confirm PLOF with caregiver at [facility] when able        OT Problem List: Decreased  strength;Decreased activity tolerance;Decreased cognition      OT Treatment/Interventions: Self-care/ADL training;Therapeutic exercise;Energy conservation;Therapeutic activities;Patient/family education;Balance training    OT Goals(Current goals can be found in the care plan section) Acute Rehab OT Goals OT Goal Formulation: Patient unable to participate in goal setting Time For Goal Achievement: 08/07/20 ADL Goals Pt Will Perform Grooming: with set-up;with supervision;bed level Pt Will Perform Upper Body Bathing: with min assist;bed level Additional ADL Goal #1: Pt will follow 50% of 1-step instructions during bed-level ADLs  OT Frequency: Min 1X/week    AM-PAC OT "6 Clicks" Daily Activity     Outcome Measure Help from another person eating meals?: A Little Help from another person taking care of personal grooming?: A Little Help from another person toileting, which includes using toliet, bedpan, or urinal?: A Lot Help from another person bathing (including washing, rinsing, drying)?: A Lot Help from another person to put on and taking off regular upper body clothing?: A Lot Help from another person to put on and taking off regular lower body clothing?: A Lot 6 Click Score: 14   End of Session Nurse Communication: Mobility status  Activity Tolerance: Patient limited by lethargy Patient left: in bed;with call bell/phone within reach;with bed alarm set  OT Visit Diagnosis: Muscle weakness (generalized) (M62.81)                Time: 0037-0488 OT Time Calculation (min): 11 min Charges:  OT General Charges $OT Visit: 1 Visit OT Evaluation $OT Eval Moderate Complexity: 1 Mod  Matthew Folks, OTR/L [facility] [phone]

## 2020-07-24 NOTE — Discharge Summary (Addendum)
Molly [name] ZOX:[id number] DOB: 12-Oct-1945 DOA: 07/22/2020  PCP: Ethelda Chick, MD  Admit date: 07/22/2020 Discharge date: 07/24/2020  Time spent: 35 minutes  Recommendations for Outpatient Follow-up:  1. Will need very close monitoring of stooling (goal 1-2 soft formed BMs per day) and titration of bowel regimen to achieve this goal 2. PCP f/u 3. GI referred    Discharge Diagnoses:  Principal Problem:   Megacolon Active Problems:   Type 2 diabetes mellitus with hyperlipidemia (HCC)   COPD (chronic obstructive pulmonary disease) (HCC)   Essential hypertension   HLD (hyperlipidemia)   Gastritis without bleeding   Lethargy   Pressure injury of skin   Constipation   Discharge Condition: stable  Diet recommendation: high fiber  Filed Weights   07/22/20 0958 07/23/20 0314 07/23/20 1143  Weight: 79.4 kg 73.3 kg 71.9 kg    History of present illness:   Molly [name] is a 75 y.o. female with medical history significant for hyperlipidemia, depression, anxiety, CVA with left-sided residual weakness, insulin-dependent mellitus, dementia, history hypertension, GERD, presents to the emergency department for chief concerns of abdominal pain.  Patient states that her abdominal pain and discomfort has been ongoing for the past 3 days.  She states that the facility told her that she had a bowel movement in the a.m. prior to being transported to the emergency department.  Patient is not able to tell me if the bowel movement is watery or well-formed.  Social history: Patient lives at home in a facility.  Vaccination: Unknown vaccination history  ROS: Unable to complete due to advanced dementia  ED Course: Discussed with ED provider, patient requiring hospitalization for severe constipation.  Vitals in the emergency department is remarkable for temperature of 97.6, respiration rate of 13, heart rate 89, blood pressure 102/70, SPO2 of 96% on room air.  Patient is status  post morphine 4 mg IV once, ondansetron 4 mg IV once, LR bolus 1 L per ED provider.  Labs in the emergency department was remarkable for lactic acid of 2.6 improved to 2.5, respiratory panel was negative including COVID PCR, influenza a and B, nonfasting blood glucose was 147, albumin 3.2.  EDP attempted digital rectal disimpaction with minimal stool output.  EDP ordered smog enema and oral GlycoLax.  Hospital Course:   Severe constipation: w/ initial concern of toxic megacolon. General surg consulted but does not believe there is concern for toxic megacolon and they have signed off. Has had multiple bowel movements and KUB yesterday shows interval improvement in stool burden. Abdomen this morning is soft and non-tender.  - cont home bowel regimen, will add lactulose. Will need to continue to titrate to achieve 1-2 soft formed BMs per day  Procedures:  none   Consultations:  gen surg  Discharge Exam: Vitals:   07/24/20 0742 07/24/20 0800  BP: 125/67   Pulse: 88 85  Resp: 16   Temp: 97.7 F (36.5 C)   SpO2: 91% 93%    General exam: Appears comfortable  Respiratory system: Clear to auscultation. Respiratory effort normal. Cardiovascular system: S1 & S2 +. No rubs, gallops or clicks.  Gastrointestinal system: Abdomen is distended, soft and tender.  Normoactive bowel sounds Central nervous system: Alert and awake. Moves all extremities  Psychiatry: awake, responds to simple questions  Discharge Instructions   Discharge Instructions    Diet - low sodium heart healthy   Complete by: As directed    Discharge wound care:   Complete by: As directed  Dressing and frequent repositioning   Increase activity slowly   Complete by: As directed      Allergies as of 07/24/2020   No Known Allergies     Medication List    TAKE these medications   acetaminophen 325 MG tablet Commonly known as: TYLENOL Take 650 mg by mouth every 6 (six) hours as needed for moderate pain or  mild pain.   albuterol 108 (90 Base) MCG/ACT inhaler Commonly known as: VENTOLIN HFA Inhale 2 puffs into the lungs every 6 (six) hours as needed for shortness of breath. Shake well. Wait 1 minute between puffs.   amitriptyline 50 MG tablet Commonly known as: ELAVIL Take 50 mg by mouth at bedtime.   aspirin EC 81 MG tablet Take 81 mg by mouth daily.   atorvastatin 20 MG tablet Commonly known as: LIPITOR Take 20 mg by mouth daily at 6 PM.   bisacodyl 10 MG suppository Commonly known as: DULCOLAX Place 10 mg rectally every 3 (three) days. Hold for loose stool   diclofenac sodium 1 % Gel Commonly known as: VOLTAREN Apply 2 g topically 4 (four) times daily. To each shoulders   Generlac 10 GM/15ML Soln Generic drug: lactulose (encephalopathy) Take 15 mLs by mouth daily. For constipation. Note days given.   HumaLOG 100 UNIT/ML injection Generic drug: insulin lispro Inject 1-6 Units into the skin See admin instructions. Sliding scale. Sugar 150-199=1U, 200-249=2U, 250-299=3U, 300-349=4U, 350-400=5U, >400=6U and call provider   hydrocortisone 25 MG suppository Commonly known as: ANUSOL-HC Place 1 suppository (25 mg total) rectally every 12 (twelve) hours. What changed:   when to take this  reasons to take this   lactulose 10 GM/15ML solution Commonly known as: CHRONULAC Take 30 mLs (20 g total) by mouth daily.   metFORMIN 500 MG tablet Commonly known as: GLUCOPHAGE Take 500 mg by mouth 2 (two) times daily.   mupirocin ointment 2 % Commonly known as: BACTROBAN Place 1 application into the nose 2 (two) times daily.   OXYGEN Inhale 2 L into the lungs daily as needed (to keep O2 stats >90%).   polyethylene glycol 17 g packet Commonly known as: MIRALAX / GLYCOLAX Take 17 g by mouth daily. Dissolved in 4 - 9 ounces of liquid. Hold for loose stools.   senna-docusate 8.6-50 MG tablet Commonly known as: Senokot-S Take 2 tablets by mouth 2 (two) times daily.    simethicone 80 MG chewable tablet Commonly known as: MYLICON Chew 160 mg by mouth daily as needed for flatulence.   traZODone 50 MG tablet Commonly known as: DESYREL Take 25 mg by mouth at bedtime.   Evaristo Bury FlexTouch 100 UNIT/ML FlexTouch Pen Generic drug: insulin degludec Inject 10 Units into the skin daily.   Vitamin D3 25 MCG (1000 UT) Caps Take 1,000 Units by mouth daily.            Discharge Care Instructions  (From admission, onward)         Start     Ordered   07/24/20 0000  Discharge wound care:       Comments: Dressing and frequent repositioning   07/24/20 0904         No Known Allergies    The results of significant diagnostics from this hospitalization (including imaging, microbiology, ancillary and laboratory) are listed below for reference.    Significant Diagnostic Studies: DG Abd 1 View  Result Date: 07/23/2020 CLINICAL DATA:  Megacolon. EXAM: ABDOMEN - 1 VIEW COMPARISON:  07/22/2020.  CT 07/22/2020. FINDINGS:  Oral contrast in the right colon. Interim improvement of rectal distention with large amount of stool remaining in the rectum. No free air. Visceral vascular calcification. Degenerative change lumbar spine. Degenerative changes both hips. IMPRESSION: Interim improvement in rectal distention with large amount of stool remaining in the rectum. Electronically Signed   By: Maisie Fushomas  Register   On: 07/23/2020 07:12   CT ABDOMEN PELVIS W CONTRAST  Result Date: 07/22/2020 CLINICAL DATA:  75 year old female with abdominal pain and distension. EXAM: CT ABDOMEN AND PELVIS WITH CONTRAST TECHNIQUE: Multidetector CT imaging of the abdomen and pelvis was performed using the standard protocol following bolus administration of intravenous contrast. CONTRAST:  100mL OMNIPAQUE IOHEXOL 300 MG/ML  SOLN COMPARISON:  CT Abdomen and Pelvis 12/27/2019 and earlier. FINDINGS: Lower chest: Stable cardiac size at the upper limits of normal. No pericardial or pleural effusion.  Negative lung bases. Calcified coronary artery atherosclerosis. Hepatobiliary: Stable liver and gallbladder. Pancreas: Stable atrophy. Spleen: Stable, negative. Adrenals/Urinary Tract: Normal adrenal glands. There were no convincing renal calculi in September, such that renal collecting system density today is favored to be early contrast excretion. And otherwise both kidneys appears stable. Nondilated ureters. Mass effect on the urinary bladder is secondary to the abnormal rectosigmoid colon. The bladder remains relatively diminutive. Stomach/Bowel: Severe volume of retained stool in the rectosigmoid colon (sagittal image 62) with dilated gas-filled redundant sigmoid and additional retained stool in the upstream descending colon. The sigmoid measures up to 13.8 cm diameter. But there is no volvulus or transition point. Decompressed splenic flexure where wall thickening may be due to under distension. Redundant gas and stool-filled transverse and right colon also. Decompressed and negative terminal ileum. Appendix remains normal on coronal image 63. No dilated small bowel. Oral contrast has not yet reached the TI. Negative stomach and duodenum. No free air. No definite free fluid. Vascular/Lymphatic: Aortoiliac calcified atherosclerosis. Major arterial structures remain patent. Portal venous system is patent. Reproductive: Negative aside from mass effect by the enlarged rectosigmoid colon. Other: No pelvic free fluid. Presacral stranding appears stable or mildly improved from September. Musculoskeletal: Advanced degeneration in the spine. Levels of degenerative ankylosis. Chronic pubic rami fractures. No acute osseous abnormality identified. IMPRESSION: 1. Severe distal Fecal Impaction with suggestion of superimposed generalized large bowel dysmotility. 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 2. No perforation or abscess. Mass effect on the urinary bladder.  Normal appendix and small bowel. 3. No other acute or inflammatory process identified in the abdomen or pelvis. Aortic Atherosclerosis (ICD10-I70.0). Electronically Signed   By: Odessa FlemingH  Hall M.D.   On: 07/22/2020 11:48   DG Chest Port 1 View  Result Date: 07/22/2020 CLINICAL DATA:  Tachycardia.  Abdominal pain. EXAM: PORTABLE CHEST 1 VIEW COMPARISON:  Chest x-ray dated December 11, 2019. FINDINGS: The heart size and mediastinal contours are within normal limits. Both lungs are clear. The visualized skeletal structures are unremarkable. IMPRESSION: No active disease. Electronically Signed   By: Obie DredgeWilliam T Derry M.D.   On: 07/22/2020 10:54   DG Abd 2 Views  Result Date: 07/22/2020 CLINICAL DATA:  75 year old female with abdominal pain and distension EXAM: ABDOMEN - 2 VIEW COMPARISON:  Abdominal radiograph dated 05/07/2015 and CT dated 07/22/2020. FINDINGS: Evaluation is limited due to body habitus. There is severe distension of the rectal vault measuring 18 cm in diameter. There is diffusely distended colon. No dilatation of the small bowel. Contrast noted in several small bowel loops. There is oral contrast in the  terminal ileum and cecum. No definite free air. Degenerative changes of the spine. IMPRESSION: Diffuse colonic distension with severe distension of the rectal vault, significantly progressed since the earlier CT. No obvious free air. Electronically Signed   By: Elgie Collard M.D.   On: 07/22/2020 19:20    Microbiology: Recent Results (from the past 240 hour(s))  Resp Panel by RT-PCR (Flu A&B, Covid) Nasopharyngeal Swab     Status: None   Collection Time: 07/22/20 10:30 AM   Specimen: Nasopharyngeal Swab; Nasopharyngeal(NP) swabs in vial transport medium  Result Value Ref Range Status   SARS Coronavirus 2 by RT PCR NEGATIVE NEGATIVE Final    Comment: (NOTE) SARS-CoV-2 target nucleic acids are NOT DETECTED.  The SARS-CoV-2 RNA is generally detectable in upper respiratory specimens during  the acute phase of infection. The lowest concentration of SARS-CoV-2 viral copies this assay can detect is 138 copies/mL. A negative result does not preclude SARS-Cov-2 infection and should not be used as the sole basis for treatment or other patient management decisions. A negative result may occur with  improper specimen collection/handling, submission of specimen other than nasopharyngeal swab, presence of viral mutation(s) within the areas targeted by this assay, and inadequate number of viral copies(<138 copies/mL). A negative result must be combined with clinical observations, patient history, and epidemiological information. The expected result is Negative.  Fact Sheet for Patients:  BloggerCourse.com  Fact Sheet for Healthcare Providers:  SeriousBroker.it  This test is no t yet approved or cleared by the [place] FDA and  has been authorized for detection and/or diagnosis of SARS-CoV-2 by FDA under an Emergency Use Authorization (EUA). This EUA will remain  in effect (meaning this test can be used) for the duration of the COVID-19 declaration under Section 564(b)(1) of the Act, 21 U.S.C.section 360bbb-3(b)(1), unless the authorization is terminated  or revoked sooner.       Influenza A by PCR NEGATIVE NEGATIVE Final   Influenza B by PCR NEGATIVE NEGATIVE Final    Comment: (NOTE) The Xpert Xpress SARS-CoV-2/FLU/RSV plus assay is intended as an aid in the diagnosis of influenza from Nasopharyngeal swab specimens and should not be used as a sole basis for treatment. Nasal washings and aspirates are unacceptable for Xpert Xpress SARS-CoV-2/FLU/RSV testing.  Fact Sheet for Patients: BloggerCourse.com  Fact Sheet for Healthcare Providers: SeriousBroker.it  This test is not yet approved or cleared by the [place] FDA and has been authorized for detection and/or  diagnosis of SARS-CoV-2 by FDA under an Emergency Use Authorization (EUA). This EUA will remain in effect (meaning this test can be used) for the duration of the COVID-19 declaration under Section 564(b)(1) of the Act, 21 U.S.C. section 360bbb-3(b)(1), unless the authorization is terminated or revoked.  Performed at [facility], [address] [place], [place] [address]   CULTURE, BLOOD (ROUTINE X 2) w Reflex to ID Panel     Status: None (Preliminary result)   Collection Time: 07/22/20  1:32 PM   Specimen: BLOOD  Result Value Ref Range Status   Specimen Description BLOOD  RIGHT UPPER ARM  Final   Special Requests   Final    BOTTLES DRAWN AEROBIC AND ANAEROBIC Blood Culture results may not be optimal due to an excessive volume of blood received in culture bottles   Culture   Final    NO GROWTH 2 DAYS Performed at [facility], [address] [place], [place] [address]    Report Status PENDING  Incomplete  CULTURE,  BLOOD (ROUTINE X 2) w Reflex to ID Panel     Status: None (Preliminary result)   Collection Time: 07/22/20  1:37 PM   Specimen: BLOOD  Result Value Ref Range Status   Specimen Description BLOOD BLOOD RIGHT FOREARM  Final   Special Requests   Final    BOTTLES DRAWN AEROBIC AND ANAEROBIC Blood Culture results may not be optimal due to an excessive volume of blood received in culture bottles   Culture   Final    NO GROWTH 2 DAYS Performed at [facility], [address] [place], [place] [address]    Report Status PENDING  Incomplete     Labs: Basic Metabolic Panel: Recent Labs  Lab 07/22/20 1005 07/23/20 0552 07/24/20 0529  NA 136 138 138  K 3.9 4.0 3.7  CL 98 101 99  CO2 29 29 30   GLUCOSE 147* 74 101*  BUN 17 11 8   CREATININE 0.62 0.46 0.47  CALCIUM 8.9 8.9 9.0  MG  --   --  1.6*   Liver Function Tests: Recent Labs  Lab 07/22/20 1005  AST 20  ALT 11  ALKPHOS 58  BILITOT 0.8  PROT 7.3  ALBUMIN 3.2*   Recent Labs   Lab 07/22/20 1005  LIPASE 21   No results for input(s): AMMONIA in the last 168 hours. CBC: Recent Labs  Lab 07/22/20 1005 07/23/20 0552 07/24/20 0529  WBC 9.5 7.4 6.6  HGB 12.1 11.1* 10.0*  HCT 38.6 35.8* 31.7*  MCV 86.9 89.3 86.1  PLT 355 274 322   Cardiac Enzymes: No results for input(s): CKTOTAL, CKMB, CKMBINDEX, TROPONINI in the last 168 hours. BNP: BNP (last 3 results) No results for input(s): BNP in the last 8760 hours.  ProBNP (last 3 results) No results for input(s): PROBNP in the last 8760 hours.  CBG: Recent Labs  Lab 07/23/20 0933 07/23/20 1142 07/23/20 1649 07/23/20 2026 07/24/20 0745  GLUCAP 135* 95 79 133* 102*       Signed:  07/25/20 MD.  [facility] 07/24/2020, 9:04 AM

## 2020-07-24 NOTE — NC FL2 (Signed)
[place] MEDICAID FL2 LEVEL OF CARE SCREENING TOOL     IDENTIFICATION  Patient Name: Molly [name] Birthdate: 07-07-1945 Sex: female Admission Date (Current Location): 07/22/2020  [place] and [place] Number:  Chiropodist and Address:  [facility], [address], [place], [place] [address]      Provider Number: [id number]  Attending Physician Name and Address:  Kathrynn Running, MD  Relative Name and Phone Number:       Current Level of Care: Hospital Recommended Level of Care: Skilled Nursing Facility Prior Approval Number:    Date Approved/Denied:   PASRR Number: [id number]  Discharge Plan: SNF    Current Diagnoses: Patient Active Problem List   Diagnosis Date Noted  . Constipation 07/24/2020  . Pressure injury of skin 07/23/2020  . Megacolon 07/22/2020  . Obesity, diabetes, and hypertension syndrome (HCC)   . Vaginal bleeding   . Acute urinary retention   . Acute metabolic encephalopathy   . Rectal bleeding   . Weakness   . Lethargy   . History of completed stroke   . GI bleeding 12/27/2019  . Lower GI bleed 12/27/2019  . C. difficile colitis 12/14/2019  . Acute colitis 12/12/2019  . Decubitus ulcer of coccyx, stage 2 (HCC) 04/28/2016  . Acute GI bleeding 04/27/2016  . Acute blood loss anemia   . Blood in stool   . Gastritis without bleeding   . Involuntary movements   . Acute respiratory failure (HCC) 03/05/2016  . Acute encephalopathy 02/06/2016  . Acute cerebrovascular accident (CVA) (HCC)   . Altered mental status   . 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 Cellulitis of left lower extremity 11/19/2014  . COPD (chronic obstructive pulmonary disease) (HCC) 11/19/2014  . Essential hypertension 11/19/2014  . HLD (hyperlipidemia) 11/19/2014  . Sepsis (HCC) 11/19/2014  . Type 2 diabetes mellitus with hyperlipidemia (HCC) 05/21/2014  . Chronic anemia 05/21/2014    Orientation RESPIRATION BLADDER Height & Weight      Self  Normal Incontinent,External catheter Weight: 158 lb 8 oz (71.9 kg) Height:  5\' 4"  (162.6 cm)  BEHAVIORAL SYMPTOMS/MOOD NEUROLOGICAL BOWEL NUTRITION STATUS   (None)  (None) Incontinent Diet (Carb modified)  AMBULATORY STATUS COMMUNICATION OF NEEDS Skin   Total Care Verbally PU Stage and Appropriate Care,Other (Comment) (Excoriated location on right thigh. Diabetic ulcer on left anterior foot: Foam.) PU Stage 1 Dressing:  (Right anterior ankle: Foam.)                     Personal Care Assistance Level of Assistance              Functional Limitations Info  Sight,Hearing,Speech Sight Info: Adequate Hearing Info: Adequate Speech Info: Adequate (Delayed responses)    SPECIAL CARE FACTORS FREQUENCY                       Contractures Contractures Info: Not present    Additional Factors Info  Code Status,Allergies Code Status Info: Full code Allergies Info: NKDA           Current Medications (07/24/2020):  This is the current hospital active medication list Current Facility-Administered Medications  Medication Dose Route Frequency Provider Last Rate Last Admin  . acetaminophen (TYLENOL) tablet 650 mg  650 mg Oral Q6H PRN Cox, Amy N, DO       Or  . acetaminophen (TYLENOL) suppository 650 mg  650 mg Rectal Q6H PRN Cox, Amy N, DO      .  albuterol (VENTOLIN HFA) 108 (90 Base) MCG/ACT inhaler 2 puff  2 puff Inhalation Q6H PRN Cox, Amy N, DO      . amitriptyline (ELAVIL) tablet 50 mg  50 mg Oral QHS Cox, Amy N, DO   50 mg at 07/23/20 2014  . atorvastatin (LIPITOR) tablet 20 mg  20 mg Oral QHS Cox, Amy N, DO   20 mg at 07/23/20 2013  . bisacodyl (DULCOLAX) EC tablet 10 mg  10 mg Oral Once Manuela Schwartz, NP      . dextrose 50 % solution 50 mL  1 ampule Intravenous PRN Cox, Amy N, DO   50 mL at 07/23/20 0911  . enoxaparin (LOVENOX) injection 40 mg  40 mg Subcutaneous Q24H Fabienne Bruns M, MD      . insulin aspart (novoLOG) injection 0-15 Units  0-15 Units  Subcutaneous TID WC Cox, Amy N, DO      . iohexol (OMNIPAQUE) 9 MG/ML oral solution 500 mL  500 mL Oral BID PRN Cox, Amy N, DO   500 mL at 07/22/20 1044  . lactulose (CHRONULAC) 10 GM/15ML solution 20 g  20 g Oral Daily Wouk, Wilfred Curtis, MD      . magnesium citrate solution 1 Bottle  1 Bottle Oral Once Manuela Schwartz, NP      . magnesium sulfate IVPB 2 g 50 mL  2 g Intravenous Once Wouk, Wilfred Curtis, MD      . morphine 2 MG/ML injection 1 mg  1 mg Intravenous Q4H PRN Cox, Amy N, DO      . ondansetron (ZOFRAN) tablet 4 mg  4 mg Oral Q6H PRN Cox, Amy N, DO       Or  . ondansetron (ZOFRAN) injection 4 mg  4 mg Intravenous Q6H PRN Cox, Amy N, DO      . polyethylene glycol (MIRALAX / GLYCOLAX) packet 17 g  17 g Oral Daily Wouk, Wilfred Curtis, MD      . senna [facility]) tablet 17.2 mg  2 tablet Oral Daily Wouk, Wilfred Curtis, MD         Discharge Medications: Please see discharge summary for a list of discharge medications.  Relevant Imaging Results:  Relevant Lab Results:   Additional Information SS#: [id number]  Margarito Liner, LCSW

## 2020-07-24 NOTE — Progress Notes (Signed)
Pt. Received from 2A approximately 0200. Pt. VS stable. Pt. Resting. IV team consult for new IV access . Continue to monitor

## 2020-07-25 ENCOUNTER — Encounter: Payer: Self-pay | Admitting: *Deleted

## 2020-07-27 LAB — CULTURE, BLOOD (ROUTINE X 2)
Culture: NO GROWTH
Culture: NO GROWTH

## 2020-08-23 ENCOUNTER — Other Ambulatory Visit
Admission: RE | Admit: 2020-08-23 | Discharge: 2020-08-23 | Disposition: A | Discharge status: Home / Self Care | Payer: Medicare HMO | Source: Other Acute Inpatient Hospital | Attending: Family Medicine | Admitting: Family Medicine

## 2020-08-23 DIAGNOSIS — R5383 Other fatigue: Secondary | ICD-10-CM | POA: Diagnosis not present

## 2020-08-23 DIAGNOSIS — N390 Urinary tract infection, site not specified: Secondary | ICD-10-CM | POA: Diagnosis not present

## 2020-08-23 DIAGNOSIS — Z8616 Personal history of COVID-19: Secondary | ICD-10-CM | POA: Insufficient documentation

## 2020-08-23 DIAGNOSIS — J449 Chronic obstructive pulmonary disease, unspecified: Secondary | ICD-10-CM | POA: Insufficient documentation

## 2020-08-23 DIAGNOSIS — R112 Nausea with vomiting, unspecified: Secondary | ICD-10-CM | POA: Diagnosis present

## 2020-08-23 DIAGNOSIS — K5190 Ulcerative colitis, unspecified, without complications: Secondary | ICD-10-CM | POA: Insufficient documentation

## 2020-08-23 LAB — CBC WITH DIFFERENTIAL/PLATELET
Abs Immature Granulocytes: 0.03 10*3/uL (ref 0.00–0.07)
Basophils Absolute: 0 10*3/uL (ref 0.0–0.1)
Basophils Relative: 0 %
Eosinophils Absolute: 0.2 10*3/uL (ref 0.0–0.5)
Eosinophils Relative: 2 %
HCT: 33.9 % — ABNORMAL LOW (ref 36.0–46.0)
Hemoglobin: 11 g/dL — ABNORMAL LOW (ref 12.0–15.0)
Immature Granulocytes: 0 %
Lymphocytes Relative: 41 %
Lymphs Abs: 3.4 10*3/uL (ref 0.7–4.0)
MCH: 27.6 pg (ref 26.0–34.0)
MCHC: 32.4 g/dL (ref 30.0–36.0)
MCV: 85.2 fL (ref 80.0–100.0)
Monocytes Absolute: 0.6 10*3/uL (ref 0.1–1.0)
Monocytes Relative: 7 %
Neutro Abs: 4.2 10*3/uL (ref 1.7–7.7)
Neutrophils Relative %: 50 %
Platelets: 279 10*3/uL (ref 150–400)
RBC: 3.98 MIL/uL (ref 3.87–5.11)
RDW: 15.9 % — ABNORMAL HIGH (ref 11.5–15.5)
WBC: 8.4 10*3/uL (ref 4.0–10.5)
nRBC: 0 % (ref 0.0–0.2)

## 2020-08-23 LAB — BASIC METABOLIC PANEL
Anion gap: 9 (ref 5–15)
BUN: 19 mg/dL (ref 8–23)
CO2: 25 mmol/L (ref 22–32)
Calcium: 9.3 mg/dL (ref 8.9–10.3)
Chloride: 99 mmol/L (ref 98–111)
Creatinine, Ser: 0.8 mg/dL (ref 0.44–1.00)
GFR, Estimated: >60 mL/min (ref >60)
Glucose, Bld: 102 mg/dL — ABNORMAL HIGH (ref 70–99)
Potassium: 4.8 mmol/L (ref 3.5–5.1)
Sodium: 133 mmol/L — ABNORMAL LOW (ref 135–145)

## 2021-03-25 ENCOUNTER — Other Ambulatory Visit: Payer: Self-pay

## 2021-03-25 DIAGNOSIS — Z7982 Long term (current) use of aspirin: Secondary | ICD-10-CM | POA: Diagnosis not present

## 2021-03-25 DIAGNOSIS — I10 Essential (primary) hypertension: Secondary | ICD-10-CM | POA: Diagnosis not present

## 2021-03-25 DIAGNOSIS — N390 Urinary tract infection, site not specified: Secondary | ICD-10-CM | POA: Diagnosis not present

## 2021-03-25 DIAGNOSIS — K5641 Fecal impaction: Secondary | ICD-10-CM | POA: Diagnosis present

## 2021-03-25 DIAGNOSIS — Z87891 Personal history of nicotine dependence: Secondary | ICD-10-CM

## 2021-03-25 DIAGNOSIS — Z7984 Long term (current) use of oral hypoglycemic drugs: Secondary | ICD-10-CM

## 2021-03-25 DIAGNOSIS — I69354 Hemiplegia and hemiparesis following cerebral infarction affecting left non-dominant side: Secondary | ICD-10-CM

## 2021-03-25 DIAGNOSIS — Z83438 Family history of other disorder of lipoprotein metabolism and other lipidemia: Secondary | ICD-10-CM | POA: Diagnosis not present

## 2021-03-25 DIAGNOSIS — Z794 Long term (current) use of insulin: Secondary | ICD-10-CM | POA: Diagnosis not present

## 2021-03-25 DIAGNOSIS — Z7401 Bed confinement status: Secondary | ICD-10-CM

## 2021-03-25 DIAGNOSIS — E1169 Type 2 diabetes mellitus with other specified complication: Secondary | ICD-10-CM | POA: Diagnosis not present

## 2021-03-25 DIAGNOSIS — L89159 Pressure ulcer of sacral region, unspecified stage: Secondary | ICD-10-CM | POA: Diagnosis not present

## 2021-03-25 DIAGNOSIS — Z79899 Other long term (current) drug therapy: Secondary | ICD-10-CM | POA: Diagnosis not present

## 2021-03-25 DIAGNOSIS — Z20822 Contact with and (suspected) exposure to covid-19: Secondary | ICD-10-CM | POA: Diagnosis not present

## 2021-03-25 DIAGNOSIS — E785 Hyperlipidemia, unspecified: Secondary | ICD-10-CM | POA: Diagnosis not present

## 2021-03-25 DIAGNOSIS — L03818 Cellulitis of other sites: Secondary | ICD-10-CM | POA: Diagnosis present

## 2021-03-25 DIAGNOSIS — Z823 Family history of stroke: Secondary | ICD-10-CM | POA: Diagnosis not present

## 2021-03-25 DIAGNOSIS — Z803 Family history of malignant neoplasm of breast: Secondary | ICD-10-CM

## 2021-03-25 DIAGNOSIS — J449 Chronic obstructive pulmonary disease, unspecified: Secondary | ICD-10-CM | POA: Diagnosis present

## 2021-03-25 DIAGNOSIS — D509 Iron deficiency anemia, unspecified: Secondary | ICD-10-CM | POA: Diagnosis present

## 2021-03-25 DIAGNOSIS — Z9981 Dependence on supplemental oxygen: Secondary | ICD-10-CM | POA: Diagnosis not present

## 2021-03-25 LAB — CBC WITH DIFFERENTIAL/PLATELET
Abs Immature Granulocytes: 0.02 10*3/uL (ref 0.00–0.07)
Basophils Absolute: 0 10*3/uL (ref 0.0–0.1)
Basophils Relative: 0 %
Eosinophils Absolute: 0.2 10*3/uL (ref 0.0–0.5)
Eosinophils Relative: 2 %
HCT: 35 % — ABNORMAL LOW (ref 36.0–46.0)
Hemoglobin: 10.8 g/dL — ABNORMAL LOW (ref 12.0–15.0)
Immature Granulocytes: 0 %
Lymphocytes Relative: 32 %
Lymphs Abs: 2.4 10*3/uL (ref 0.7–4.0)
MCH: 24.7 pg — ABNORMAL LOW (ref 26.0–34.0)
MCHC: 30.9 g/dL (ref 30.0–36.0)
MCV: 79.9 fL — ABNORMAL LOW (ref 80.0–100.0)
Monocytes Absolute: 0.7 10*3/uL (ref 0.1–1.0)
Monocytes Relative: 9 %
Neutro Abs: 4.3 10*3/uL (ref 1.7–7.7)
Neutrophils Relative %: 57 %
Platelets: 285 10*3/uL (ref 150–400)
RBC: 4.38 MIL/uL (ref 3.87–5.11)
RDW: 15.9 % — ABNORMAL HIGH (ref 11.5–15.5)
WBC: 7.6 10*3/uL (ref 4.0–10.5)
nRBC: 0 % (ref 0.0–0.2)

## 2021-03-25 LAB — BASIC METABOLIC PANEL
Anion gap: 7 (ref 5–15)
BUN: 19 mg/dL (ref 8–23)
CO2: 28 mmol/L (ref 22–32)
Calcium: 9.3 mg/dL (ref 8.9–10.3)
Chloride: 101 mmol/L (ref 98–111)
Creatinine, Ser: 0.41 mg/dL — ABNORMAL LOW (ref 0.44–1.00)
GFR, Estimated: >60 mL/min (ref >60)
Glucose, Bld: 179 mg/dL — ABNORMAL HIGH (ref 70–99)
Potassium: 3.5 mmol/L (ref 3.5–5.1)
Sodium: 136 mmol/L (ref 135–145)

## 2021-03-25 LAB — LACTIC ACID, PLASMA: Lactic Acid, Venous: 1.2 mmol/L (ref 0.5–1.9)

## 2021-03-25 NOTE — ED Notes (Signed)
First nurse-pt brought in  via ems from home with sacral ulcer right buttock with pus per ems.  Pt alert.  BED BOUND pt.  VS WNL per ems.   Pt in lobby .

## 2021-03-25 NOTE — ED Provider Notes (Signed)
Emergency Medicine Provider Triage Evaluation Note  Molly [name], a 75 y.o. female  was evaluated in triage.  Pt complains of infected sacral wound.  Patient has been home for approximately 3 months from a nursing facility.  She gets a daily home health care for her ADLs and wound care.  Her husband reports that the wound was nearly completely resolved, to the week ago.  Home health nurse visiting today, suggested that the patient present to the ED for further evaluation and management of her sacral wound.  Review of Systems  Positive: Sacral pressure wound Negative: FCS  Physical Exam  BP 119/63 (BP Location: Right Arm)    Pulse 76    Temp 98.4 F (36.9 C) (Oral)    Resp 16    SpO2 95%  Gen:   Awake, no distress  NAD Resp:  Normal effort CTA MSK:   Moves extremities without difficulty  Other:  Sacral wound with central ulceration and surrounding erythema  Medical Decision Making  Medically screening exam initiated at 6:39 PM.  Appropriate orders placed.  Molly [name] was informed that the remainder of the evaluation will be completed by another provider, this initial triage assessment does not replace that evaluation, and the importance of remaining in the ED until their evaluation is complete.  Geriatric patient ED evaluation of a persistent pressure room to the sacrum.   Lissa Hoard, PA-C 03/25/21 1843    Shaune Pollack, MD 03/25/21 [phone]

## 2021-03-25 NOTE — ED Triage Notes (Signed)
Pt to ED for bed sore to sacrum for over a month. States when came home from rehab it was the size of nickel but has gotten worse over the past 3 weeks. HH nurse recommended come to ER.  Bed bound

## 2021-03-26 ENCOUNTER — Inpatient Hospital Stay
Admission: EM | Admit: 2021-03-26 | Discharge: 2021-03-29 | DRG: 603 | Disposition: A | Discharge status: Home Health | Payer: Medicare Other | Attending: Internal Medicine | Admitting: Internal Medicine

## 2021-03-26 ENCOUNTER — Observation Stay: Payer: Medicare Other

## 2021-03-26 DIAGNOSIS — N3000 Acute cystitis without hematuria: Secondary | ICD-10-CM

## 2021-03-26 DIAGNOSIS — E785 Hyperlipidemia, unspecified: Secondary | ICD-10-CM

## 2021-03-26 DIAGNOSIS — I10 Essential (primary) hypertension: Secondary | ICD-10-CM | POA: Diagnosis not present

## 2021-03-26 DIAGNOSIS — T148XXA Other injury of unspecified body region, initial encounter: Secondary | ICD-10-CM

## 2021-03-26 DIAGNOSIS — K5900 Constipation, unspecified: Secondary | ICD-10-CM | POA: Diagnosis present

## 2021-03-26 DIAGNOSIS — Z8673 Personal history of transient ischemic attack (TIA), and cerebral infarction without residual deficits: Secondary | ICD-10-CM

## 2021-03-26 DIAGNOSIS — J449 Chronic obstructive pulmonary disease, unspecified: Secondary | ICD-10-CM

## 2021-03-26 DIAGNOSIS — E1169 Type 2 diabetes mellitus with other specified complication: Secondary | ICD-10-CM

## 2021-03-26 DIAGNOSIS — L089 Local infection of the skin and subcutaneous tissue, unspecified: Secondary | ICD-10-CM

## 2021-03-26 DIAGNOSIS — S31000A Unspecified open wound of lower back and pelvis without penetration into retroperitoneum, initial encounter: Secondary | ICD-10-CM | POA: Diagnosis not present

## 2021-03-26 DIAGNOSIS — G8194 Hemiplegia, unspecified affecting left nondominant side: Secondary | ICD-10-CM

## 2021-03-26 DIAGNOSIS — N390 Urinary tract infection, site not specified: Secondary | ICD-10-CM | POA: Diagnosis present

## 2021-03-26 DIAGNOSIS — D509 Iron deficiency anemia, unspecified: Secondary | ICD-10-CM

## 2021-03-26 LAB — URINALYSIS, ROUTINE W REFLEX MICROSCOPIC
Bilirubin Urine: NEGATIVE
Glucose, UA: NEGATIVE mg/dL
Hgb urine dipstick: NEGATIVE
Ketones, ur: NEGATIVE mg/dL
Nitrite: NEGATIVE
Protein, ur: 30 mg/dL — AB
Specific Gravity, Urine: 1.018 (ref 1.005–1.030)
Squamous Epithelial / HPF: NONE SEEN (ref 0–5)
WBC, UA: >50 WBC/hpf — ABNORMAL HIGH (ref 0–5)
pH: 5 (ref 5.0–8.0)

## 2021-03-26 LAB — APTT: aPTT: 35 seconds (ref 24–36)

## 2021-03-26 LAB — PROTIME-INR
INR: 1.1 (ref 0.8–1.2)
Prothrombin Time: 13.7 seconds (ref 11.4–15.2)

## 2021-03-26 LAB — CBG MONITORING, ED
Glucose-Capillary: 115 mg/dL — ABNORMAL HIGH (ref 70–99)
Glucose-Capillary: 115 mg/dL — ABNORMAL HIGH (ref 70–99)
Glucose-Capillary: 110 mg/dL — ABNORMAL HIGH (ref 70–99)

## 2021-03-26 LAB — BRAIN NATRIURETIC PEPTIDE: B Natriuretic Peptide: 38.7 pg/mL (ref 0.0–100.0)

## 2021-03-26 LAB — RESP PANEL BY RT-PCR (FLU A&B, COVID) ARPGX2
Influenza A by PCR: NEGATIVE
Influenza B by PCR: NEGATIVE
SARS Coronavirus 2 by RT PCR: NEGATIVE

## 2021-03-26 LAB — SEDIMENTATION RATE: Sed Rate: 17 mm/hr (ref 0–22)

## 2021-03-26 MED ORDER — HYDRALAZINE HCL 20 MG/ML IJ SOLN: 5.0000 mg | INTRAMUSCULAR | Status: DC | PRN

## 2021-03-26 MED ORDER — VANCOMYCIN HCL 1500 MG/300ML IV SOLN
1500.0000 mg | INTRAVENOUS | Status: DC
  Administered 2021-03-27 – 2021-03-28 (×2): 1500 mg via INTRAVENOUS
  Filled 2021-03-26 (×2): qty 300

## 2021-03-26 MED ORDER — DAKINS (1/4 STRENGTH) 0.125 % EX SOLN: Freq: Every day | CUTANEOUS | Status: DC

## 2021-03-26 MED ORDER — ONDANSETRON HCL 4 MG/2ML IJ SOLN: 4.0000 mg | Freq: Three times a day (TID) | INTRAMUSCULAR | Status: DC | PRN

## 2021-03-26 MED ORDER — HEPARIN SODIUM (PORCINE) 5000 UNIT/ML IJ SOLN
5000.0000 [IU] | Freq: Three times a day (TID) | INTRAMUSCULAR | Status: DC
  Administered 2021-03-26 – 2021-03-29 (×9): 5000 [IU] via SUBCUTANEOUS
  Filled 2021-03-26 (×9): qty 1

## 2021-03-26 MED ORDER — DAKINS (1/4 STRENGTH) 0.125 % EX SOLN
Freq: Every day | CUTANEOUS | Status: DC
  Filled 2021-03-26: qty 473

## 2021-03-26 MED ORDER — SIMETHICONE 80 MG PO CHEW
160.0000 mg | CHEWABLE_TABLET | Freq: Every day | ORAL | Status: DC | PRN
  Filled 2021-03-26: qty 2

## 2021-03-26 MED ORDER — VANCOMYCIN HCL 1500 MG/300ML IV SOLN
1500.0000 mg | Freq: Once | INTRAVENOUS | Status: AC
  Administered 2021-03-26: 09:00:00 1500 mg via INTRAVENOUS
  Filled 2021-03-26: qty 300

## 2021-03-26 MED ORDER — OXYCODONE-ACETAMINOPHEN 5-325 MG PO TABS: 1.0000 | ORAL_TABLET | ORAL | Status: DC | PRN

## 2021-03-26 MED ORDER — PIPERACILLIN-TAZOBACTAM 3.375 G IVPB 30 MIN
3.3750 g | Freq: Once | INTRAVENOUS | Status: AC
  Administered 2021-03-26: 08:00:00 3.375 g via INTRAVENOUS
  Filled 2021-03-26: qty 50

## 2021-03-26 MED ORDER — GADOBUTROL 1 MMOL/ML IV SOLN
7.5000 mL | Freq: Once | INTRAVENOUS | Status: AC | PRN
  Administered 2021-03-26: 11:00:00 7.5 mL via INTRAVENOUS

## 2021-03-26 MED ORDER — INSULIN ASPART 100 UNIT/ML IJ SOLN: 0.0000 [IU] | Freq: Every day | INTRAMUSCULAR | Status: DC

## 2021-03-26 MED ORDER — INSULIN GLARGINE-YFGN 100 UNIT/ML ~~LOC~~ SOLN
5.0000 [IU] | Freq: Every day | SUBCUTANEOUS | Status: DC
  Administered 2021-03-28 – 2021-03-29 (×2): 5 [IU] via SUBCUTANEOUS
  Filled 2021-03-26 (×4): qty 0.05

## 2021-03-26 MED ORDER — VITAMIN D 25 MCG (1000 UNIT) PO TABS
1000.0000 [IU] | ORAL_TABLET | Freq: Every day | ORAL | Status: DC
Start: 2021-03-27 — End: 2021-03-29
  Administered 2021-03-27 – 2021-03-29 (×3): 1000 [IU] via ORAL
  Filled 2021-03-26 (×3): qty 1

## 2021-03-26 MED ORDER — INSULIN ASPART 100 UNIT/ML IJ SOLN
0.0000 [IU] | Freq: Three times a day (TID) | INTRAMUSCULAR | Status: DC
  Administered 2021-03-27 (×2): 2 [IU] via SUBCUTANEOUS
  Administered 2021-03-27: 08:00:00 1 [IU] via SUBCUTANEOUS
  Administered 2021-03-28: 17:00:00 2 [IU] via SUBCUTANEOUS
  Administered 2021-03-28: 12:00:00 5 [IU] via SUBCUTANEOUS
  Administered 2021-03-28 – 2021-03-29 (×2): 2 [IU] via SUBCUTANEOUS
  Filled 2021-03-26 (×7): qty 1

## 2021-03-26 MED ORDER — ATORVASTATIN CALCIUM 20 MG PO TABS
20.0000 mg | ORAL_TABLET | Freq: Every day | ORAL | Status: DC
  Administered 2021-03-26 – 2021-03-28 (×3): 20 mg via ORAL
  Filled 2021-03-26 (×3): qty 1

## 2021-03-26 MED ORDER — BISACODYL 10 MG RE SUPP
10.0000 mg | Freq: Every day | RECTAL | Status: DC | PRN
  Filled 2021-03-26: qty 1

## 2021-03-26 MED ORDER — ACETAMINOPHEN 325 MG PO TABS: 650.0000 mg | ORAL_TABLET | Freq: Four times a day (QID) | ORAL | Status: DC | PRN

## 2021-03-26 MED ORDER — LACTULOSE 10 GM/15ML PO SOLN
20.0000 g | Freq: Two times a day (BID) | ORAL | Status: DC
  Administered 2021-03-26 – 2021-03-27 (×3): 20 g via ORAL
  Filled 2021-03-26 (×3): qty 30

## 2021-03-26 MED ORDER — TRAZODONE HCL 50 MG PO TABS
25.0000 mg | ORAL_TABLET | Freq: Every day | ORAL | Status: DC
  Administered 2021-03-26 – 2021-03-28 (×3): 25 mg via ORAL
  Filled 2021-03-26 (×3): qty 1

## 2021-03-26 MED ORDER — POLYETHYLENE GLYCOL 3350 17 G PO PACK
17.0000 g | PACK | Freq: Every day | ORAL | Status: DC
  Administered 2021-03-27: 10:00:00 17 g via ORAL
  Filled 2021-03-26: qty 1

## 2021-03-26 MED ORDER — ASPIRIN EC 81 MG PO TBEC
81.0000 mg | DELAYED_RELEASE_TABLET | Freq: Every day | ORAL | Status: DC
  Administered 2021-03-27 – 2021-03-29 (×3): 81 mg via ORAL
  Filled 2021-03-26 (×3): qty 1

## 2021-03-26 MED ORDER — ALBUTEROL SULFATE (2.5 MG/3ML) 0.083% IN NEBU: 2.5000 mg | INHALATION_SOLUTION | RESPIRATORY_TRACT | Status: DC | PRN

## 2021-03-26 MED ORDER — HALOPERIDOL LACTATE 5 MG/ML IJ SOLN
2.0000 mg | Freq: Once | INTRAMUSCULAR | Status: AC
  Administered 2021-03-26: 14:00:00 2 mg via INTRAVENOUS
  Filled 2021-03-26: qty 1

## 2021-03-26 MED ORDER — SENNOSIDES-DOCUSATE SODIUM 8.6-50 MG PO TABS
2.0000 | ORAL_TABLET | Freq: Two times a day (BID) | ORAL | Status: DC
  Administered 2021-03-26 – 2021-03-27 (×3): 2 via ORAL
  Filled 2021-03-26 (×4): qty 2

## 2021-03-26 MED ORDER — DM-GUAIFENESIN ER 30-600 MG PO TB12: 1.0000 | ORAL_TABLET | Freq: Two times a day (BID) | ORAL | Status: DC | PRN

## 2021-03-26 MED ORDER — PIPERACILLIN-TAZOBACTAM 3.375 G IVPB
3.3750 g | Freq: Three times a day (TID) | INTRAVENOUS | Status: DC
  Administered 2021-03-27 – 2021-03-28 (×5): 3.375 g via INTRAVENOUS
  Filled 2021-03-26 (×4): qty 50

## 2021-03-26 NOTE — Progress Notes (Signed)
Pharmacy Antibiotic Note  Molly [name] is a 75 y.o. female admitted on 03/26/2021 with sacral wound with infection. 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 Pharmacy has been consulted for vancomycin and Zosyn dosing.  Plan: Zosyn 3.375 g IV q8h EI Pt received vancomycin 1500 mg IV x 1 loading dose. Will continue with 1500 mg q24h Est AUC: 521.2 Used: Scr 0.8 (rounded up), IBW, Vd 0.72 Obtain vanc levels around 4th or 5th dose if continued Monitor renal function and adjust dose as clinically indicated  Height: 5\' 7"  (170.2 cm) Weight: 74.8 kg (165 lb) IBW/kg (Calculated) : 61.6  Temp (24hrs), Avg:98.4 F (36.9 C), Min:98.4 F (36.9 C), Max:98.4 F (36.9 C)  Recent Labs  Lab 03/25/21 1838  WBC 7.6  CREATININE 0.41*  LATICACIDVEN 1.2    Estimated Creatinine Clearance: 64.2 mL/min (A) (by C-G formula based on SCr of 0.41 mg/dL (L)).    No Known Allergies  Antimicrobials this admission: 12/28 Zosyn >>  12/28 Vancomycin >>    Microbiology results: 12/28 BCx: sent 12/28 UCx: sent  12/28 Wound: sent    Thank you for allowing pharmacy to be a part of this patients care.  1/29 Bertram Haddix 03/26/2021 9:43 AM

## 2021-03-26 NOTE — Consult Note (Signed)
WOC Nurse Consult Note: Remote consult completed.  Current image in media. Reason for Consult:Chronic nonhealing sacral wound.  Husband reports presence of maggots.  MRI negative for osteomyelitis -inflammatory changes present and complaints of unbearable pain.  Severe stool impaction present (history megacolon) and this could be contributory to pain.  Patient with advanced dementia.  Wound type:Chronic nonhealing unstageable pressure injury Pressure Injury POA: Yes reports of present for several months.  Measurement: 1 cm x 2 cm  Wound QAS:TMHDQQ to visualize due to presence of adherent devitalized tissue.  Drainage (amount, consistency, odor) minimal serosanguinous  spouse reports presence of maggots but this was not observed by staff at bedside.  Periwound: Deep erythema present circumferentially across sacrococcygeal area.  Extends 2 cm.  Surgical consult also pending.  Dressing procedure/placement/frequency: Cleanse sacral wound with NS and pat gently dry.  Apply Dakins moist gauze to wound bed to promote autolysis and manage any infestation.  Change daily x 3 days.  If no surgical intervention, will begin Santyl enzymatic debridement after 3 days and slough remains.  Will follow.  Maple Hudson MSN, RN, FNP-BC CWON Wound, Ostomy, Continence Nurse Pager [phone]

## 2021-03-26 NOTE — ED Provider Notes (Signed)
[facility] Emergency Department Provider Note  ____________________________________________   Event Date/Time   First MD Initiated Contact with Patient 03/26/21 [phone]     (approximate)  I have reviewed the triage vital signs and the nursing notes.   HISTORY  Chief Complaint Wound Check    HPI Molly [name] is a 75 y.o. female who is severely debilitated at baseline and is cared for at home by her husband and who is a home Geneticist, molecular.  They presents tonight for evaluation of a wound to her buttocks.  They report that she has had a wound to her sacrum at least 3 months ago but it was small.  Over the last few months and recently much more rapidly it has gotten worse and covered a bigger area, it goes deeper, and has had purulent discharge.  Her husband also said that sometimes he sees "little worms in the sheets where she has been lying" but he does not see them all the time.  She has had a greatly increased amount of pain with any pressure to the area and reports that it is severe if she lies on her back.  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  She reports having dysuria but denies fever, chest pain, shortness of breath, nausea, and vomiting.  Symptoms have become severe and nothing in particular makes them better.     Past Medical History:  Diagnosis Date   Anxiety    COPD (chronic obstructive pulmonary disease) (HCC)    Diabetes (HCC)    Hemorrhoid    HLD (hyperlipidemia)    HTN (hypertension)    Stroke (HCC) 2008   left weakness    Patient Active Problem List   Diagnosis Date Noted   Sacral wound 03/26/2021   Constipation 07/24/2020   Pressure injury of skin 07/23/2020   Megacolon 07/22/2020   Obesity, diabetes, and hypertension syndrome (HCC)    Vaginal bleeding    Acute urinary retention    Acute metabolic encephalopathy    Rectal bleeding    Weakness    Lethargy    History of  completed stroke    GI bleeding 12/27/2019   Lower GI bleed 12/27/2019   C. 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   Cellulitis of left lower extremity 11/19/2014   COPD (chronic obstructive pulmonary disease) (HCC) 11/19/2014   Essential hypertension 11/19/2014   HLD (hyperlipidemia) 11/19/2014   Sepsis (HCC) 11/19/2014   Type 2 diabetes mellitus with hyperlipidemia (HCC) 05/21/2014   Chronic anemia 05/21/2014    Past Surgical History:  Procedure Laterality Date   AMPUTATION Left 05/22/2014   Procedure: AMPUTATION RAY LEFT FOURTH AND FIFTH ;  Surgeon: Toni Arthurs, MD;  Location: [facility] OR;  Service: Orthopedics;  Laterality: Left;   CATARACT EXTRACTION     COLONOSCOPY  2004   COLONOSCOPY WITH PROPOFOL N/A 04/29/2016   Procedure: COLONOSCOPY WITH PROPOFOL;  Surgeon: Wyline Mood, MD;  Location[facility] ENDOSCOPY;  Service: Gastroenterology;  Laterality: N/A;   ESOPHAGOGASTRODUODENOSCOPY (EGD) WITH PROPOFOL N/A 04/27/2016  Procedure: ESOPHAGOGASTRODUODENOSCOPY (EGD) WITH PROPOFOL;  Surgeon: Lucilla Lame, MD;  Location: [facility] ENDOSCOPY;  Service: Endoscopy;  Laterality: N/A;    Prior to Admission medications   Medication Sig Start Date End Date Taking? Authorizing Provider  acetaminophen (TYLENOL) 325 MG tablet Take 650 mg by mouth every 6 (six) hours as needed for moderate pain or mild pain.    [provider]  albuterol (PROVENTIL HFA;VENTOLIN HFA) 108 (90 Base) MCG/ACT inhaler Inhale 2 puffs into the lungs every 6 (six) hours as needed for shortness of breath. Shake well. Wait 1 minute between puffs. 05/20/09    [provider]  amitriptyline (ELAVIL) 50 MG tablet Take 50 mg by mouth at bedtime. 07/18/20   [provider]  aspirin EC 81 MG tablet Take 81 mg by mouth daily.  02/28/09   [provider]  atorvastatin (LIPITOR) 20 MG tablet Take 20 mg by mouth daily at 6 PM.  12/07/19   [provider]  bisacodyl (DULCOLAX) 10 MG suppository Place 10 mg rectally every 3 (three) days. Hold for loose stool    [provider]  Cholecalciferol (VITAMIN D3) 1000 UNITS CAPS Take 1,000 Units by mouth daily.    [provider]  diclofenac sodium (VOLTAREN) 1 % GEL Apply 2 g topically 4 (four) times daily. To each shoulders    [provider]  GENERLAC 10 GM/15ML SOLN Take 15 mLs by mouth daily. For constipation. Note days given. 07/08/20   [provider]  HUMALOG 100 UNIT/ML injection Inject 1-6 Units into the skin See admin instructions. Sliding scale. Sugar 150-199=1U, 200-249=2U, 250-299=3U, 300-349=4U, 350-400=5U, >400=6U and call provider 01/04/20   [provider]  lactulose (CHRONULAC) 10 GM/15ML solution Take 30 mLs (20 g total) by mouth daily. 07/24/20   Wouk, Ailene Rud, MD  metFORMIN (GLUCOPHAGE) 500 MG tablet Take 500 mg by mouth 2 (two) times daily. 07/18/20   [provider]  mupirocin ointment (BACTROBAN) 2 % Place 1 application into the nose 2 (two) times daily. Patient not taking: Reported on 07/22/2020 01/01/20   Loletha Grayer, MD  OXYGEN Inhale 2 L into the lungs daily as needed (to keep O2 stats >90%).    [provider]  polyethylene glycol (MIRALAX / GLYCOLAX) 17 g packet Take 17 g by mouth daily. Dissolved in 4 - 9 ounces of liquid. Hold for loose stools.    [provider]  senna-docusate (SENOKOT-S) 8.6-50 MG tablet Take 2 tablets by mouth 2 (two) times daily.    [provider]  simethicone (MYLICON) 80 MG chewable tablet Chew 160 mg by mouth daily as needed for flatulence. 07/17/20    [provider]  traZODone (DESYREL) 50 MG tablet Take 25 mg by mouth at bedtime. 09/11/19   [provider]  TRESIBA FLEXTOUCH 100 UNIT/ML FlexTouch Pen Inject 10 Units into the skin daily. 07/10/20   [provider]    Allergies Patient has no known allergies.  Family History  Problem Relation Age of Onset   Hyperlipidemia Mother    Varicose Veins Mother    Deep vein thrombosis Father    Stroke Father    Breast cancer Paternal Aunt    Stroke Maternal Grandmother    Stroke Paternal Grandmother     Social History Social History   Tobacco Use   Smoking status: Former    Packs/day: 0.50    Years: 2.00    Pack years: 1.00    Types: Cigarettes    Quit date:  11/18/1968    Years since quitting: 52.3   Smokeless tobacco: Never  Substance Use Topics   Alcohol use: No    Alcohol/week: 0.0 standard drinks    Comment: seldom   Drug use: No    Review of Systems Constitutional: No fever/chills Eyes: No visual changes. ENT: No sore throat. Cardiovascular: Denies chest pain. Respiratory: Denies shortness of breath. Gastrointestinal: No abdominal pain.  No nausea, no vomiting.  No diarrhea.  No constipation. Genitourinary: Positive for dysuria. Musculoskeletal: Negative for neck pain.  Negative for back pain. Integumentary: Deep sacral wound with purulence and possible maggots. Neurological: Negative for headaches, focal weakness or numbness.   ____________________________________________   PHYSICAL EXAM:  VITAL SIGNS: ED Triage Vitals  Enc Vitals Group     BP 03/25/21 1836 119/63     Pulse Rate 03/25/21 1836 76     Resp 03/25/21 1836 16     Temp 03/25/21 1836 98.4 F (36.9 C)     Temp Source 03/25/21 1836 Oral     SpO2 03/25/21 1836 95 %     Weight 03/25/21 1842 74.8 kg (165 lb)     Height 03/25/21 1842 1.702 m (5\' 7" )     Head Circumference --      Peak Flow --      Pain Score 03/25/21 1842 5     Pain Loc --      Pain Edu? --       Excl. in [place]? --     Constitutional: Awake and alert, apparently at her baseline mental status but not very conversant. Eyes: Conjunctivae are normal.  Head: Atraumatic. Nose: No congestion/rhinnorhea. Mouth/Throat: Patient is wearing a mask. Neck: No stridor.  No meningeal signs.   Cardiovascular: Normal rate, regular rhythm. Good peripheral circulation. Respiratory: Normal respiratory effort.  No retractions. Gastrointestinal: Soft and nontender. No distention.  Musculoskeletal: Chronic wasting of extremities particularly the left side. Neurologic: Baseline neurological status with chronic left hemiplegia without any acute abnormalities per husband. Skin: Deep sacral decubitus wound, likely stage II or III.  Purulence present within the wound, no visible maggots at this time.  Area of erythema around the wound extends at least 10 cm in diameter.  No crepitus or concern for necrotizing skin infection but concerned about possible cellulitis or deep infection with fistula and/or osteomyelitis.    ____________________________________________   LABS (all labs ordered are listed, but only abnormal results are displayed)  Labs Reviewed  BASIC METABOLIC PANEL - Abnormal; Notable for the following components:      Result Value   Glucose, Bld 179 (*)    Creatinine, Ser 0.41 (*)    All other components within normal limits  CBC WITH DIFFERENTIAL/PLATELET - Abnormal; Notable for the following components:   Hemoglobin 10.8 (*)    HCT 35.0 (*)    MCV 79.9 (*)    MCH 24.7 (*)    RDW 15.9 (*)    All other components within normal limits  URINALYSIS, ROUTINE W REFLEX MICROSCOPIC - Abnormal; Notable for the following components:   Color, Urine YELLOW (*)    APPearance HAZY (*)    Protein, ur 30 (*)    Leukocytes,Ua MODERATE (*)    WBC, UA >50 (*)    Bacteria, UA RARE (*)    All other components within normal limits  RESP PANEL BY RT-PCR (FLU A&B, COVID) ARPGX2  AEROBIC/ANAEROBIC CULTURE W  GRAM STAIN (SURGICAL/DEEP WOUND)  CULTURE, BLOOD (ROUTINE X 2)  CULTURE, BLOOD (ROUTINE X 2)  URINE CULTURE  LACTIC ACID, PLASMA  PROTIME-INR   ____________________________________________   RADIOLOGY MRI pelvis with and without contrast pending at time of admission to the hospitalist service. ____________________________________________   INITIAL IMPRESSION / MDM / ASSESSMENT AND PLAN / ED COURSE  As part of my medical decision making, I reviewed the following data within the [place] History obtained from family, Nursing notes reviewed and incorporated, Labs reviewed , Old chart reviewed, Discussed with admitting physician , and Notes from prior ED visits   Differential diagnosis includes, but is not limited to, wound infection, stage III sacral decubitus ulcer requiring debridement, osteomyelitis, cellulitis, necrotizing fasciitis.  Patient's vital signs are stable and within normal limits.  She does not meet sepsis criteria but her wound has reportedly gotten severe over the last 3 months and has become purulent with possible at least intermittent infestation with maggots.  She also has urinary tract infection.  Her metabolic panel and CBC are reassuring and she has a normal lactic acid.  However given the pain and appearance of the wound and the gradual worsening over extended period of time which apparently has rapidly gotten worse over the last week in particular, I will admit to the hospitalist service.  Blood cultures and wound culture were sent, and I ordered Zosyn 3.375 g and vancomycin per pharmacy consult for broad-spectrum coverage.  I ordered an MR pelvis with and without contrast for better characterization of the wound including the soft tissues and any possibility of early osteomyelitis.  I feel that obtaining a plain x-ray would be a wasted study given that it will not adequately assess the soft tissues and is likely to miss early osteomyelitis, and given  the debilitation of the patient and the appearance of the wound, and an MRI will be of greatest utility.       Clinical Course as of 03/26/21 0806  Wed Mar 26, 2021  0735 Discussed case by phone with Dr. Blaine Hamper with the hospitalist service and he will admit. [CF]    Clinical Course User Index [CF] Hinda Kehr, MD     ____________________________________________  FINAL CLINICAL IMPRESSION(S) / ED DIAGNOSES  Final diagnoses:  Sacral wound, initial encounter  Wound infection  Urinary tract infection without hematuria, site unspecified  Left hemiplegia ([place])     MEDICATIONS GIVEN DURING THIS VISIT:  Medications  piperacillin-tazobactam (ZOSYN) IVPB 3.375 g (has no administration in time range)  vancomycin (VANCOREADY) IVPB 1500 mg/300 mL (has no administration in time range)     ED Discharge Orders     None        Note:  This document was prepared using Dragon voice recognition software and may include unintentional dictation errors.   Hinda Kehr, MD 03/26/21 [phone]

## 2021-03-26 NOTE — H&P (Signed)
History and Physical    Molly [name] ZOX:[id number] DOB: 12-16-1945 DOA: 03/26/2021  Referring MD/NP/PA:   PCP: Josephine Cables, MD   Patient coming from:  The patient is coming from home.  At baseline, pt is independent for most of ADL.        Chief Complaint: Sacral wound with infection.  HPI: Molly [name] is a 75 y.o. female with medical history significant of stroke with left-sided paraplegia, bedbound, hypertension, hyperlipidemia, diabetes mellitus, COPD, depression, GI bleeding, anemia, C. difficile colitis, megacolon, who presents with sacral wound with infection.  Patient has a chronic sacral wound for several months, which has been progressively worsening in the past several weeks. The wound has been expanding. Becomes deeper, and has had purulent discharge. Pt has mild pain, no fever or chills.  Patient does not have chest pain, cough, shortness breath.  No nausea vomiting, diarrhea or abdominal pain. Pt reported to EDP that she has dysuria, but denies symptoms of UTI to me. Reportedly maggots were seen, but I did not see any maggots on my examination.  Patient has constipation. Pt is cared for at home by her husband.   ED Course: pt was found to have WBC 7.6, negative COVID PCR, positive urinalysis (with hazy appearance, moderate amount of leukocyte, rare bacteria, WBC > 50), GFR > 60, temperature normal, blood pressure 129/48, heart rate 68, RR 18, oxygen saturation 94% on room air.  Patient is placed on MedSurg bed for obs. Dr. Peyton Najjar of surgery is consulted.  MRI of her pelvis: 1. Sacral decubitus ulceration with underlying subcutaneous and presacral inflammatory changes. No focal fluid collection. 2. Chronic deformity of the coccyx without definite signs of active osteomyelitis. 3. Severe fecal impaction of the rectosigmoid colon, similar to prior study.   Review of Systems:   General: no fevers, chills, no body weight gain, has fatigue HEENT: no blurry  vision, hearing changes or sore throat Respiratory: no dyspnea, coughing, wheezing CV: no chest pain, no palpitations GI: no nausea, vomiting, abdominal pain, diarrhea, constipation GU: no dysuria, burning on urination, increased urinary frequency, hematuria  Ext: no leg edema Neuro: no vision change or hearing loss. Has left sided hemiplegia from a previous stroke Skin: no rash.  Has sacral wound MSK: No muscle spasm, no deformity, no limitation of range of movement in spin Heme: No easy bruising.  Travel history: No recent long distant travel.  Allergy: No Known Allergies  Past Medical History:  Diagnosis Date   Anxiety    COPD (chronic obstructive pulmonary disease) (HCC)    Diabetes ([place])    Hemorrhoid    HLD (hyperlipidemia)    HTN (hypertension)    Stroke ([place]) 2008   left weakness    Past Surgical History:  Procedure Laterality Date   AMPUTATION Left 05/22/2014   Procedure: AMPUTATION RAY LEFT FOURTH AND FIFTH ;  Surgeon: Wylene Simmer, MD;  Location: [facility];  Service: Orthopedics;  Laterality: Left;   CATARACT EXTRACTION     COLONOSCOPY  2004   COLONOSCOPY WITH PROPOFOL N/A 04/29/2016   Procedure: COLONOSCOPY WITH PROPOFOL;  Surgeon: Jonathon Bellows, MD;  Location: [facility]COPY;  Service: Gastroenterology;  Laterality: N/A;   ESOPHAGOGASTRODUODENOSCOPY (EGD) WITH PROPOFOL N/A 04/27/2016   Procedure: ESOPHAGOGASTRODUODENOSCOPY (EGD) WITH PROPOFOL;  Surgeon: Lucilla Lame, MD;  Location: [facility] ENDOSCOPY;  Service: Endoscopy;  Laterality: N/A;    Social History:  reports that she quit smoking about 52 years ago. Her smoking use included cigarettes. She has a 1.00  pack-year smoking history. She has never used smokeless tobacco. She reports that she does not drink alcohol and does not use drugs.  Family History:  Family History  Problem Relation Age of Onset   Hyperlipidemia Mother    Varicose Veins Mother    Deep vein thrombosis Father    Stroke Father    Breast cancer Paternal  Aunt    Stroke Maternal Grandmother    Stroke Paternal Grandmother      Prior to Admission medications   Medication Sig Start Date End Date Taking? Authorizing Provider  acetaminophen (TYLENOL) 325 MG tablet Take 650 mg by mouth every 6 (six) hours as needed for moderate pain or mild pain.    [provider]  albuterol (PROVENTIL HFA;VENTOLIN HFA) 108 (90 Base) MCG/ACT inhaler Inhale 2 puffs into the lungs every 6 (six) hours as needed for shortness of breath. Shake well. Wait 1 minute between puffs. 05/20/09   [provider]  amitriptyline (ELAVIL) 50 MG tablet Take 50 mg by mouth at bedtime. 07/18/20   [provider]  aspirin EC 81 MG tablet Take 81 mg by mouth daily.  02/28/09   [provider]  atorvastatin (LIPITOR) 20 MG tablet Take 20 mg by mouth daily at 6 PM.  12/07/19   [provider]  bisacodyl (DULCOLAX) 10 MG suppository Place 10 mg rectally every 3 (three) days. Hold for loose stool    [provider]  Cholecalciferol (VITAMIN D3) 1000 UNITS CAPS Take 1,000 Units by mouth daily.    [provider]  diclofenac sodium (VOLTAREN) 1 % GEL Apply 2 g topically 4 (four) times daily. To each shoulders    [provider]  GENERLAC 10 GM/15ML SOLN Take 15 mLs by mouth daily. For constipation. Note days given. 07/08/20   [provider]  HUMALOG 100 UNIT/ML injection Inject 1-6 Units into the skin See admin instructions. Sliding scale. Sugar 150-199=1U, 200-249=2U, 250-299=3U, 300-349=4U, 350-400=5U, >400=6U and call provider 01/04/20   [provider]  lactulose (CHRONULAC) 10 GM/15ML solution Take 30 mLs (20 g total) by mouth daily. 07/24/20   Wouk, Ailene Rud, MD  metFORMIN (GLUCOPHAGE) 500 MG tablet Take 500 mg by mouth 2 (two) times daily. 07/18/20   [provider]  mupirocin ointment (BACTROBAN) 2 % Place 1 application into the nose 2 (two) times daily. Patient not taking: Reported on  07/22/2020 01/01/20   Loletha Grayer, MD  OXYGEN Inhale 2 L into the lungs daily as needed (to keep O2 stats >90%).    [provider]  polyethylene glycol (MIRALAX / GLYCOLAX) 17 g packet Take 17 g by mouth daily. Dissolved in 4 - 9 ounces of liquid. Hold for loose stools.    [provider]  senna-docusate (SENOKOT-S) 8.6-50 MG tablet Take 2 tablets by mouth 2 (two) times daily.    [provider]  simethicone (MYLICON) 80 MG chewable tablet Chew 160 mg by mouth daily as needed for flatulence. 07/17/20   [provider]  traZODone (DESYREL) 50 MG tablet Take 25 mg by mouth at bedtime. 09/11/19   [provider]  TRESIBA FLEXTOUCH 100 UNIT/ML FlexTouch Pen Inject 10 Units into the skin daily. 07/10/20   [provider]    Physical Exam: Vitals:   03/26/21 1530 03/26/21 1600 03/26/21 1630 03/26/21 1700  BP: 134/63 (!) 142/70 (!) 146/76 (!) 142/74  Pulse: 72 71 82   Resp: 18     Temp:      TempSrc:  SpO2: 96% 100% 96%   Weight:      Height:       General: Not in acute distress HEENT:       Eyes: PERRL, EOMI, no scleral icterus.       ENT: No discharge from the ears and nose, no pharynx injection, no tonsillar enlargement.        Neck: No JVD, no bruit, no mass felt. Heme: No neck lymph node enlargement. Cardiac: S1/S2, RRR, No murmurs, No gallops or rubs. Respiratory: No rales, wheezing, rhonchi or rubs. GI: Soft, nondistended, nontender, no rebound pain, no organomegaly, BS present. GU: No hematuria Ext: No pitting leg edema bilaterally. 1+DP/PT pulse bilaterally. Musculoskeletal: No joint deformities, No joint redness or warmth, no limitation of ROM in spin. Skin: Has a deep sacral wound with surrounding erythema, please see picture in EDP's note, taken by EDP. No visible maggots at this time.  Neuro: Alert, oriented X3, cranial nerves II-XII grossly intact, has left-sided hemiplegia from previous stroke Psych: Patient is not  psychotic, no suicidal or hemocidal ideation.  Labs on Admission: I have personally reviewed following labs and imaging studies  CBC: Recent Labs  Lab 03/25/21 1838  WBC 7.6  NEUTROABS 4.3  HGB 10.8*  HCT 35.0*  MCV 79.9*  PLT 621   Basic Metabolic Panel: Recent Labs  Lab 03/25/21 1838  NA 136  K 3.5  CL 101  CO2 28  GLUCOSE 179*  BUN 19  CREATININE 0.41*  CALCIUM 9.3   GFR: Estimated Creatinine Clearance: 64.2 mL/min (A) (by C-G formula based on SCr of 0.41 mg/dL (L)). Liver Function Tests: No results for input(s): AST, ALT, ALKPHOS, BILITOT, PROT, ALBUMIN in the last 168 hours. No results for input(s): LIPASE, AMYLASE in the last 168 hours. No results for input(s): AMMONIA in the last 168 hours. Coagulation Profile: Recent Labs  Lab 03/26/21 0642  INR 1.1   Cardiac Enzymes: No results for input(s): CKTOTAL, CKMB, CKMBINDEX, TROPONINI in the last 168 hours. BNP (last 3 results) No results for input(s): PROBNP in the last 8760 hours. HbA1C: No results for input(s): HGBA1C in the last 72 hours. CBG: Recent Labs  Lab 03/26/21 1252 03/26/21 1714  GLUCAP 115* 115*   Lipid Profile: No results for input(s): CHOL, HDL, LDLCALC, TRIG, CHOLHDL, LDLDIRECT in the last 72 hours. Thyroid Function Tests: No results for input(s): TSH, T4TOTAL, FREET4, T3FREE, THYROIDAB in the last 72 hours. Anemia Panel: No results for input(s): VITAMINB12, FOLATE, FERRITIN, TIBC, IRON, RETICCTPCT in the last 72 hours. Urine analysis:    Component Value Date/Time   COLORURINE YELLOW (A) 03/26/2021 0600   APPEARANCEUR HAZY (A) 03/26/2021 0600   LABSPEC 1.018 03/26/2021 0600   PHURINE 5.0 03/26/2021 0600   GLUCOSEU NEGATIVE 03/26/2021 0600   HGBUR NEGATIVE 03/26/2021 0600   BILIRUBINUR NEGATIVE 03/26/2021 0600   KETONESUR NEGATIVE 03/26/2021 0600   PROTEINUR 30 (A) 03/26/2021 0600   UROBILINOGEN 0.2 10/15/2006 1244   NITRITE NEGATIVE 03/26/2021 0600   LEUKOCYTESUR MODERATE (A)  03/26/2021 0600   Sepsis Labs: '@LABRCNTIP' (procalcitonin:4,lacticidven:4) ) Recent Results (from the past 240 hour(s))  Aerobic/Anaerobic Culture w Gram Stain (surgical/deep wound)     Status: None (Preliminary result)   Collection Time: 03/26/21  5:54 AM   Specimen: Sacral; Wound  Result Value Ref Range Status   Specimen Description   Final    SACRAL Performed at [facility], [address], [place], [place] [address]    Special Requests   Final  NONE Performed at [facility], [place], [place] [address]    Gram Stain   Final    RARE WBC PRESENT,BOTH PMN AND MONONUCLEAR RARE GRAM POSITIVE COCCI IN PAIRS Performed at [facility], [place] [address], [place], [place] [address]    Culture PENDING  Incomplete   Report Status PENDING  Incomplete  Resp Panel by RT-PCR (Flu A&B, Covid) Nasopharyngeal Swab     Status: None   Collection Time: 03/26/21  5:58 AM   Specimen: Nasopharyngeal Swab; Nasopharyngeal(NP) swabs in vial transport medium  Result Value Ref Range Status   SARS Coronavirus 2 by RT PCR NEGATIVE NEGATIVE Final    Comment: (NOTE) SARS-CoV-2 target nucleic acids are NOT DETECTED.  The SARS-CoV-2 RNA is generally detectable in upper respiratory specimens during the acute phase of infection. The lowest concentration of SARS-CoV-2 viral copies this assay can detect is 138 copies/mL. A negative result does not preclude SARS-Cov-2 infection and should not be used as the sole basis for treatment or other patient management decisions. A negative result may occur with  improper specimen collection/handling, submission of specimen other than nasopharyngeal swab, presence of viral mutation(s) within the areas targeted by this assay, and inadequate number of viral copies(<138 copies/mL). A negative result must be combined with clinical observations, patient history, and epidemiological information. The expected result is  Negative.  Fact Sheet for Patients:  EntrepreneurPulse.com.au  Fact Sheet for Healthcare Providers:  IncredibleEmployment.be  This test is no t yet approved or cleared by the [place] FDA and  has been authorized for detection and/or diagnosis of SARS-CoV-2 by FDA under an Emergency Use Authorization (EUA). This EUA will remain  in effect (meaning this test can be used) for the duration of the COVID-19 declaration under Section 564(b)(1) of the Act, 21 U.S.C.section 360bbb-3(b)(1), unless the authorization is terminated  or revoked sooner.       Influenza A by PCR NEGATIVE NEGATIVE Final   Influenza B by PCR NEGATIVE NEGATIVE Final    Comment: (NOTE) The Xpert Xpress SARS-CoV-2/FLU/RSV plus assay is intended as an aid in the diagnosis of influenza from Nasopharyngeal swab specimens and should not be used as a sole basis for treatment. Nasal washings and aspirates are unacceptable for Xpert Xpress SARS-CoV-2/FLU/RSV testing.  Fact Sheet for Patients: EntrepreneurPulse.com.au  Fact Sheet for Healthcare Providers: IncredibleEmployment.be  This test is not yet approved or cleared by the [place] FDA and has been authorized for detection and/or diagnosis of SARS-CoV-2 by FDA under an Emergency Use Authorization (EUA). This EUA will remain in effect (meaning this test can be used) for the duration of the COVID-19 declaration under Section 564(b)(1) of the Act, 21 U.S.C. section 360bbb-3(b)(1), unless the authorization is terminated or revoked.  Performed at [facility], [address], [place], [place] [address]      Radiological Exams on Admission: MR PELVIS W WO CONTRAST  Result Date: 03/26/2021 CLINICAL DATA:  Worsening sacral wound.  Osteomyelitis suspected. EXAM: MRI PELVIS WITHOUT AND WITH CONTRAST TECHNIQUE: Multiplanar multisequence MR imaging of the pelvis was performed both  before and after administration of intravenous contrast. CONTRAST:  7.8m GADAVIST GADOBUTROL 1 MMOL/ML IV SOLN COMPARISON:  Pelvic CT 07/22/2020. FINDINGS: Despite efforts by the technologist and patient, mild motion artifact is present on today's exam and could not be eliminated. This reduces exam sensitivity and specificity. Urinary Tract: The visualized distal ureters and bladder appear unremarkable. A small amount of perinephric soft tissue stranding is  noted bilaterally. Bowel: The rectosigmoid colon is moderately distended by a large amount of stool, similar to previous CT. No significant bowel wall thickening identified. Mild presacral soft tissue stranding appears similar to previous CT. Vascular/Lymphatic: No enlarged pelvic lymph nodes identified. Iliofemoral atherosclerosis, better seen on CT. No acute vascular findings identified. Reproductive: Stable left lateral displacement of the uterus by the distended rectosigmoid colon, unchanged from previous CT. No suspicious adnexal findings. Other: No ascites. Musculoskeletal: Chronic sacral decubitus ulcer with underlying edema in the subcutaneous fat superficial to the distal sacrum and coccyx. There is no drainable fluid collection. There is a small amount of presacral fluid which appears similar to previous CT. Chronic deformity of the coccyx appears similar to previous CT. No definite progressive bone destruction or signs of active osteomyelitis. No significant hip or sacroiliac joint effusion. The level spondylosis noted. IMPRESSION: 1. Sacral decubitus ulceration with underlying subcutaneous and presacral inflammatory changes. No focal fluid collection. 2. Chronic deformity of the coccyx without definite signs of active osteomyelitis. 3. Severe fecal impaction of the rectosigmoid colon, similar to prior study. Electronically Signed   By: Richardean Sale M.D.   On: 03/26/2021 10:44     EKG: I have personally reviewed.  Sinus rhythm, QTC 466, low  voltage, LAD, nonspecific T wave change  Assessment/Plan Principal Problem:   Sacral wound Active Problems:   Type 2 diabetes mellitus with hyperlipidemia (HCC)   COPD (chronic obstructive pulmonary disease) (HCC)   Essential hypertension   HLD (hyperlipidemia)   History of completed stroke   Constipation   Wound infection   UTI (urinary tract infection)   Microcytic anemia   Sacral wound with infection: Patient is not septic clinically.  Lactic acid is normal.  No evidence of osteomyelitis on MRI of of pelvis. Consulted general surgery, Dr. Peyton Najjar who does not think patient need surgical debridement.  - Placed on MedSurg bed for observation - Empiric antimicrobial treatment with vancomycin and zosyn - PRN Zofran for nausea, Percocet for pain - Blood cultures x 2  - ESR and CRP - wound care consult -consult TOC for home health need  Type 2 diabetes mellitus with hyperlipidemia ([place]): Recent A1c 5.7, well controlled.  Patient taking metformin, NovoLog and Tresiba 10 units daily -Sliding scale insulin -Glargine insulin 5 units daily  COPD (chronic obstructive pulmonary disease) ([place]): Stable -Bronchodilators  Essential hypertension: No medication on her list -IV hydralazine as needed  HLD (hyperlipidemia) -Lipitor  History of completed stroke -Lipitor and aspirin  UTI (urinary tract infection) -On IV antibiotics as above -Follow-up blood culture  Microcytic anemia: Hemoglobin 11.8 on 08/23/2020 --> 10.8, slightly dropped. -Follow-up with CBC  Constipation: -As needed Dulcolax -Lactulose 20 g twice daily -MiraLAX 17 g daily -Senna code 2 tablet twice daily -if not improving, will need enama    DVT ppx: SQ Heparin      Code Status: Full code Family Communication: I have tried to call her husband who did not pick up the phone.  I left a message to him. Disposition Plan:  Anticipate discharge back to previous environment Consults called:  Dr. Peyton Najjar of  surgery Admission status and Level of care: Med-Surg:    Med-surg bed for obs as inpt    progressive unit for obs   as inpt      SDU/inpation          Status is: Observation  The patient remains OBS appropriate and will d/c before 2 midnights.  Date of Service 03/26/2021    [facility]   If 7PM-7AM, please contact night-coverage [email or web address] 03/26/2021, 5:28 PM

## 2021-03-26 NOTE — Consult Note (Signed)
PHARMACY -  BRIEF ANTIBIOTIC NOTE   Pharmacy has received consult(s) for Vancomycin from an ED provider.  The patient's profile has been reviewed for ht/wt/allergies/indication/available labs.    One time order(s) placed for Vancomycin 1500mg  IVPB  Further antibiotics/pharmacy consults should be ordered by admitting physician if indicated.                       Thank you, Jade Burright Rodriguez-Guzman PharmD, BCPS 03/26/2021 7:36 AM

## 2021-03-26 NOTE — ED Notes (Signed)
Pt repeatedly yelling "help". RN to bedside to assist. Pt very confused. Disoriented x3. Pt asking "when interrogation will be over" and "why am I here".  Attempted to reorient pt multiple times. Call bell in reach. Stretcher locked in lowest position.

## 2021-03-26 NOTE — Consult Note (Addendum)
SURGICAL CONSULTATION NOTE   HISTORY OF PRESENT ILLNESS (HPI):  75 y.o. female presented to [facility] ED for evaluation of sacral ulcer. Patient reports she has had sacral ulcers for many month she endorses that he has been taking care.  She denies any problem with the ulcer.  No family at bedside.  Most history taken by chart.  As per chart the husband is taking care of the wound who is a wound care nurse.  He reports that he has seemed warms in the wound.  Patient endorses having mild pain on the sacral area.  No pain radiation.  No alleviating or aggravating factors.  No previous debridement denies any fever or chills.  Surgery is consulted by Dr. Clyde Lundborg in this context for evaluation and management of sacral ulcer.  PAST MEDICAL HISTORY (PMH):  Past Medical History:  Diagnosis Date   Anxiety    COPD (chronic obstructive pulmonary disease) (HCC)    Diabetes (HCC)    Hemorrhoid    HLD (hyperlipidemia)    HTN (hypertension)    Stroke (HCC) 2008   left weakness     PAST SURGICAL HISTORY (PSH):  Past Surgical History:  Procedure Laterality Date   AMPUTATION Left 05/22/2014   Procedure: AMPUTATION RAY LEFT FOURTH AND FIFTH ;  Surgeon: Toni Arthurs, MD;  Location: [facility] OR;  Service: Orthopedics;  Laterality: Left;   CATARACT EXTRACTION     COLONOSCOPY  2004   COLONOSCOPY WITH PROPOFOL N/A 04/29/2016   Procedure: COLONOSCOPY WITH PROPOFOL;  Surgeon: Wyline Mood, MD;  Location: [facility] ENDOSCOPY;  Service: Gastroenterology;  Laterality: N/A;   ESOPHAGOGASTRODUODENOSCOPY (EGD) WITH PROPOFOL N/A 04/27/2016   Procedure: ESOPHAGOGASTRODUODENOSCOPY (EGD) WITH PROPOFOL;  Surgeon: Midge Minium, MD;  Location: [facility] ENDOSCOPY;  Service: Endoscopy;  Laterality: N/A;     MEDICATIONS:  Prior to Admission medications   Medication Sig Start Date End Date Taking? Authorizing Provider  aspirin EC 81 MG tablet Take 81 mg by mouth daily.  02/28/09  Yes [provider]  acetaminophen (TYLENOL) 325 MG tablet Take  650 mg by mouth every 6 (six) hours as needed for moderate pain or mild pain.    [provider]  albuterol (PROVENTIL HFA;VENTOLIN HFA) 108 (90 Base) MCG/ACT inhaler Inhale 2 puffs into the lungs every 6 (six) hours as needed for shortness of breath. Shake well. Wait 1 minute between puffs. 05/20/09   [provider]  amitriptyline (ELAVIL) 50 MG tablet Take 50 mg by mouth at bedtime. Patient not taking: Reported on 03/26/2021 07/18/20   [provider]  atorvastatin (LIPITOR) 20 MG tablet Take 20 mg by mouth daily at 6 PM.  12/07/19   [provider]  bisacodyl (DULCOLAX) 10 MG suppository Place 10 mg rectally every 3 (three) days. Hold for loose stool    [provider]  Cholecalciferol (VITAMIN D3) 1000 UNITS CAPS Take 1,000 Units by mouth daily.    [provider]  diclofenac sodium (VOLTAREN) 1 % GEL Apply 2 g topically 4 (four) times daily. To each shoulders    [provider]  GENERLAC 10 GM/15ML SOLN Take 15 mLs by mouth daily. For constipation. Note days given. 07/08/20   [provider]  HUMALOG 100 UNIT/ML injection Inject 1-6 Units into the skin See admin instructions. Sliding scale. Sugar 150-199=1U, 200-249=2U, 250-299=3U, 300-349=4U, 350-400=5U, >400=6U and call provider 01/04/20   [provider]  lactulose (CHRONULAC) 10 GM/15ML solution Take 30 mLs (20 g total) by mouth daily. Patient not taking: Reported on  03/26/2021 07/24/20   Wouk, Ailene Rud, MD  metFORMIN (GLUCOPHAGE) 500 MG tablet Take 500 mg by mouth 2 (two) times daily. 07/18/20   [provider]  mupirocin ointment (BACTROBAN) 2 % Place 1 application into the nose 2 (two) times daily. Patient not taking: Reported on 07/22/2020 01/01/20   Loletha Grayer, MD  OXYGEN Inhale 2 L into the lungs daily as needed (to keep O2 stats >90%).    [provider]  polyethylene glycol (MIRALAX / GLYCOLAX) 17 g packet Take 17 g by mouth daily.  Dissolved in 4 - 9 ounces of liquid. Hold for loose stools.    [provider]  senna-docusate (SENOKOT-S) 8.6-50 MG tablet Take 2 tablets by mouth 2 (two) times daily.    [provider]  simethicone (MYLICON) 80 MG chewable tablet Chew 160 mg by mouth daily as needed for flatulence. 07/17/20   [provider]  traZODone (DESYREL) 50 MG tablet Take 25 mg by mouth at bedtime. 09/11/19   [provider]  TRESIBA FLEXTOUCH 100 UNIT/ML FlexTouch Pen Inject 10 Units into the skin daily. 07/10/20   [provider]     ALLERGIES:  No Known Allergies   SOCIAL HISTORY:  Social History   Socioeconomic History   Marital status: Married    Spouse name: Not on file   Number of children: Not on file   Years of education: Not on file   Highest education level: Not on file  Occupational History   Not on file  Tobacco Use   Smoking status: Former    Packs/day: 0.50    Years: 2.00    Pack years: 1.00    Types: Cigarettes    Quit date: 11/18/1968    Years since quitting: 52.3   Smokeless tobacco: Never  Substance and Sexual Activity   Alcohol use: No    Alcohol/week: 0.0 standard drinks    Comment: seldom   Drug use: No   Sexual activity: Never  Other Topics Concern   Not on file  Social History Narrative   Not on file   Social Determinants of Health   Financial Resource Strain: Not on file  Food Insecurity: Not on file  Transportation Needs: Not on file  Physical Activity: Not on file  Stress: Not on file  Social Connections: Not on file  Intimate Partner Violence: Not on file      FAMILY HISTORY:  Family History  Problem Relation Age of Onset   Hyperlipidemia Mother    Varicose Veins Mother    Deep vein thrombosis Father    Stroke Father    Breast cancer Paternal Aunt    Stroke Maternal Grandmother    Stroke Paternal Grandmother      REVIEW OF SYSTEMS:  Constitutional: denies weight loss, fever, chills, or sweats.  Positive  for fatigue Eyes: denies any other vision changes, history of eye injury  ENT: denies sore throat, hearing problems  Respiratory: denies shortness of breath, wheezing  Cardiovascular: denies chest pain, palpitations  Gastrointestinal: abdominal pain, nausea and vomiting Genitourinary: denies burning with urination or urinary frequency Musculoskeletal: denies any other joint pains or cramps  Skin: denies any other rashes or skin discolorations.  Positive for sacral ulcer Neurological: denies any other headache, dizziness.  Positive for left-sided hemiplegia Psychiatric: denies any other depression, anxiety   All other review of systems were negative   VITAL SIGNS:  Temp:  [98.4 F (36.9 C)] 98.4 F (36.9 C) (12/27 1836) Pulse Rate:  [  64-84] 84 (12/28 1230) Resp:  [13-18] 13 (12/28 1230) BP: (119-146)/(48-73) 141/73 (12/28 1230) SpO2:  [93 %-98 %] 98 % (12/28 1230) Weight:  [74.8 kg] 74.8 kg (12/27 1842)     Height: 5\' 7"  (170.2 cm) Weight: 74.8 kg BMI (Calculated): 25.84   INTAKE/OUTPUT:  This shift: No intake/output data recorded.  Last 2 shifts: @IOLAST2SHIFTS @   PHYSICAL EXAM:  Constitutional:  -- Normal body habitus  -- Awake, alert, and cooperative Eyes:  -- Pupils equally round and reactive to light  -- No scleral icterus  Ear, nose, and throat:  -- No jugular venous distension  Pulmonary:  -- No crackles  -- Equal breath sounds bilaterally -- Breathing non-labored at rest Cardiovascular:  -- S1, S2 present  -- No pericardial rubs Gastrointestinal:  -- Abdomen soft, nontender, non-distended, no guarding or rebound tenderness -- No abdominal masses appreciated, pulsatile or otherwise  Musculoskeletal and Integumentary:  -- Wounds: A 1 x 1 cm sacral ulcer.  1.2 cm deep.  No purulence.  No necrotic tissue. -- Extremities: Left-sided weakness Neurologic:  -- Motor function: Left-sided hemiplegia   Labs:  CBC Latest Ref Rng & Units 03/25/2021 08/23/2020 07/24/2020   WBC 4.0 - 10.5 K/uL 7.6 8.4 6.6  Hemoglobin 12.0 - 15.0 g/dL 10.8(L) 11.0(L) 10.0(L)  Hematocrit 36.0 - 46.0 % 35.0(L) 33.9(L) 31.7(L)  Platelets 150 - 400 K/uL 285 279 322   CMP Latest Ref Rng & Units 03/25/2021 08/23/2020 07/24/2020  Glucose 70 - 99 mg/dL 179(H) 102(H) 101(H)  BUN 8 - 23 mg/dL 19 19 8   Creatinine 0.44 - 1.00 mg/dL 0.41(L) 0.80 0.47  Sodium 135 - 145 mmol/L 136 133(L) 138  Potassium 3.5 - 5.1 mmol/L 3.5 4.8 3.7  Chloride 98 - 111 mmol/L 101 99 99  CO2 22 - 32 mmol/L 28 25 30   Calcium 8.9 - 10.3 mg/dL 9.3 9.3 9.0  Total Protein 6.5 - 8.1 g/dL - - -  Total Bilirubin 0.3 - 1.2 mg/dL - - -  Alkaline Phos 38 - 126 U/L - - -  AST 15 - 41 U/L - - -  ALT 0 - 44 U/L - - -    Imaging studies:  EXAM: MRI PELVIS WITHOUT AND WITH CONTRAST   TECHNIQUE: Multiplanar multisequence MR imaging of the pelvis was performed both before and after administration of intravenous contrast.   CONTRAST:  7.90mL GADAVIST GADOBUTROL 1 MMOL/ML IV SOLN   COMPARISON:  Pelvic CT 07/22/2020.   FINDINGS: Despite efforts by the technologist and patient, mild motion artifact is present on today's exam and could not be eliminated. This reduces exam sensitivity and specificity.   Urinary Tract: The visualized distal ureters and bladder appear unremarkable. A small amount of perinephric soft tissue stranding is noted bilaterally.   Bowel: The rectosigmoid colon is moderately distended by a large amount of stool, similar to previous CT. No significant bowel wall thickening identified. Mild presacral soft tissue stranding appears similar to previous CT.   Vascular/Lymphatic: No enlarged pelvic lymph nodes identified. Iliofemoral atherosclerosis, better seen on CT. No acute vascular findings identified.   Reproductive: Stable left lateral displacement of the uterus by the distended rectosigmoid colon, unchanged from previous CT. No suspicious adnexal findings.   Other: No ascites.    Musculoskeletal: Chronic sacral decubitus ulcer with underlying edema in the subcutaneous fat superficial to the distal sacrum and coccyx. There is no drainable fluid collection. There is a small amount of presacral fluid which appears similar to previous CT. Chronic deformity  of the coccyx appears similar to previous CT. No definite progressive bone destruction or signs of active osteomyelitis. No significant hip or sacroiliac joint effusion. The level spondylosis noted.   IMPRESSION: 1. Sacral decubitus ulceration with underlying subcutaneous and presacral inflammatory changes. No focal fluid collection. 2. Chronic deformity of the coccyx without definite signs of active osteomyelitis. 3. Severe fecal impaction of the rectosigmoid colon, similar to prior study.     Electronically Signed   By: Richardean Sale M.D.   On: 03/26/2021 10:44  Assessment/Plan:  75 y.o. female with chronic small sacral ulcer, complicated by pertinent comorbidities including history of stroke with left-sided hemiplegia, hypertension, hyperlipidemia, diabetes, COPD, depression, chronic constipation.  Patient consulted for evaluation of sacral wound.  On physical exam there is no need of debridement at this moment.  There is no necrotic tissue, purulence, sign of deep collection.  I personally evaluate the MRI which shows expected changes of chronic ulcer.  White blood cell within normal limits.  Agree with current local care.  No surgical management indicated.  Also MRI shows severe constipation.  Patient will benefit of aggressive bowel regimen with MiraLAX or even needing bowel prep.  Also patient will benefit of regular any mass even as a patient.  Arnold Long, MD

## 2021-03-26 NOTE — ED Notes (Signed)
Pt in MRI.

## 2021-03-27 DIAGNOSIS — Z83438 Family history of other disorder of lipoprotein metabolism and other lipidemia: Secondary | ICD-10-CM | POA: Diagnosis not present

## 2021-03-27 DIAGNOSIS — I10 Essential (primary) hypertension: Secondary | ICD-10-CM | POA: Diagnosis not present

## 2021-03-27 DIAGNOSIS — Z823 Family history of stroke: Secondary | ICD-10-CM | POA: Diagnosis not present

## 2021-03-27 DIAGNOSIS — N390 Urinary tract infection, site not specified: Secondary | ICD-10-CM | POA: Diagnosis not present

## 2021-03-27 DIAGNOSIS — K5641 Fecal impaction: Secondary | ICD-10-CM | POA: Diagnosis not present

## 2021-03-27 DIAGNOSIS — D509 Iron deficiency anemia, unspecified: Secondary | ICD-10-CM | POA: Diagnosis not present

## 2021-03-27 DIAGNOSIS — L03818 Cellulitis of other sites: Secondary | ICD-10-CM | POA: Diagnosis present

## 2021-03-27 DIAGNOSIS — Z9981 Dependence on supplemental oxygen: Secondary | ICD-10-CM | POA: Diagnosis not present

## 2021-03-27 DIAGNOSIS — J449 Chronic obstructive pulmonary disease, unspecified: Secondary | ICD-10-CM | POA: Diagnosis not present

## 2021-03-27 DIAGNOSIS — Z7401 Bed confinement status: Secondary | ICD-10-CM | POA: Diagnosis not present

## 2021-03-27 DIAGNOSIS — S31000A Unspecified open wound of lower back and pelvis without penetration into retroperitoneum, initial encounter: Secondary | ICD-10-CM | POA: Diagnosis not present

## 2021-03-27 DIAGNOSIS — E785 Hyperlipidemia, unspecified: Secondary | ICD-10-CM | POA: Diagnosis not present

## 2021-03-27 DIAGNOSIS — Z7982 Long term (current) use of aspirin: Secondary | ICD-10-CM | POA: Diagnosis not present

## 2021-03-27 DIAGNOSIS — Z20822 Contact with and (suspected) exposure to covid-19: Secondary | ICD-10-CM | POA: Diagnosis not present

## 2021-03-27 DIAGNOSIS — I69354 Hemiplegia and hemiparesis following cerebral infarction affecting left non-dominant side: Secondary | ICD-10-CM | POA: Diagnosis not present

## 2021-03-27 DIAGNOSIS — L89159 Pressure ulcer of sacral region, unspecified stage: Secondary | ICD-10-CM | POA: Diagnosis not present

## 2021-03-27 DIAGNOSIS — Z79899 Other long term (current) drug therapy: Secondary | ICD-10-CM | POA: Diagnosis not present

## 2021-03-27 DIAGNOSIS — Z794 Long term (current) use of insulin: Secondary | ICD-10-CM | POA: Diagnosis not present

## 2021-03-27 DIAGNOSIS — Z803 Family history of malignant neoplasm of breast: Secondary | ICD-10-CM | POA: Diagnosis not present

## 2021-03-27 DIAGNOSIS — Z7984 Long term (current) use of oral hypoglycemic drugs: Secondary | ICD-10-CM | POA: Diagnosis not present

## 2021-03-27 DIAGNOSIS — Z87891 Personal history of nicotine dependence: Secondary | ICD-10-CM | POA: Diagnosis not present

## 2021-03-27 DIAGNOSIS — E1169 Type 2 diabetes mellitus with other specified complication: Secondary | ICD-10-CM | POA: Diagnosis not present

## 2021-03-27 LAB — GLUCOSE, CAPILLARY
Glucose-Capillary: 134 mg/dL — ABNORMAL HIGH (ref 70–99)
Glucose-Capillary: 198 mg/dL — ABNORMAL HIGH (ref 70–99)
Glucose-Capillary: 191 mg/dL — ABNORMAL HIGH (ref 70–99)
Glucose-Capillary: 135 mg/dL — ABNORMAL HIGH (ref 70–99)

## 2021-03-27 LAB — CBC
HCT: 36.5 % (ref 36.0–46.0)
Hemoglobin: 11.3 g/dL — ABNORMAL LOW (ref 12.0–15.0)
MCH: 24.6 pg — ABNORMAL LOW (ref 26.0–34.0)
MCHC: 31 g/dL (ref 30.0–36.0)
MCV: 79.3 fL — ABNORMAL LOW (ref 80.0–100.0)
Platelets: 332 10*3/uL (ref 150–400)
RBC: 4.6 MIL/uL (ref 3.87–5.11)
RDW: 15.9 % — ABNORMAL HIGH (ref 11.5–15.5)
WBC: 9.2 10*3/uL (ref 4.0–10.5)
nRBC: 0 % (ref 0.0–0.2)

## 2021-03-27 LAB — CREATININE, SERUM
Creatinine, Ser: 0.44 mg/dL (ref 0.44–1.00)
GFR, Estimated: >60 mL/min (ref >60)

## 2021-03-27 LAB — URINE CULTURE: Culture: NO GROWTH

## 2021-03-27 NOTE — Progress Notes (Signed)
PROGRESS NOTE    Molly [name]  ERX:[id number] DOB: 1945/11/05 DOA: 03/26/2021 PCP: Josephine Cables, MD   Brief Narrative:  Molly [name] is a 75 y.o. female with medical history significant of stroke with left-sided paraplegia, bedbound, hypertension, hyperlipidemia, diabetes mellitus, COPD, depression, GI bleeding, anemia, C. difficile colitis, megacolon, who presents with sacral wound with infection.   Patient has a chronic sacral wound for several months, which has been progressively worsening in the past several weeks per family who is primarily in charge of her wound care.  Reported purulence and maggots were seen per family but none were documented at intake.  Assessment & Plan:  Sacral wound with infection:  Patient does not meet sepsis criteria  MRI is without osteomyelitis No indication for debridement per surgery consult Continue IV antibiotics, likely transition to p.o. antibiotics in the next 24 to 48 hours pending clinical course ESR within normal limits Blood cultures pending Wound care following, may benefit from outpatient evaluation  Questionable UTI (urinary tract infection) -On IV antibiotics as above -Follow-up blood culture  Type 2 diabetes mellitus with hyperlipidemia ([place]):  Recent A1c 5.7, well controlled.  Patient taking metformin, NovoLog and Tresiba 10 units daily at home -Continue sliding scale insulin and long-acting insulin as well as hypoglycemic protocol while admitted   COPD (chronic obstructive pulmonary disease) Stable; continue home bronchodilators   Essential hypertension: No medication on her list -IV hydralazine as needed   HLD (hyperlipidemia) -Lipitor   History of completed stroke -Lipitor and aspirin    Microcytic anemia: Hemoglobin 11.8 on 08/23/2020 --> 10.8, slightly dropped. -Follow-up with CBC   Constipation: -Continue Dulcolax MiraLAX Senokot as needed  -Lactulose 20 g twice daily   DVT prophylaxis:  Heparin Code Status: Full Family Communication: At bedside  Status is: Inpatient  Dispo: The patient is from: Home              Anticipated d/c is to: Home              Anticipated d/c date is: 24 to 48 hours              Patient currently not medically stable for discharge  Consultants:  General surgery  Procedures:  None  Antimicrobials:  Vancomycin, Zosyn  Subjective: No acute issues or events overnight denies nausea vomiting diarrhea constipation headache fevers chills or chest pain  Objective: Vitals:   03/26/21 2330 03/27/21 0100 03/27/21 0420 03/27/21 0735  BP: 127/79 109/67 125/70 125/67  Pulse: 75 77 73 79  Resp: '19 16 18 18  ' Temp: 98.8 F (37.1 C) (!) 97.5 F (36.4 C) 97.8 F (36.6 C) 98.6 F (37 C)  TempSrc: Oral  Oral Oral  SpO2: 95% 94% 97% 95%  Weight:      Height:       No intake or output data in the 24 hours ending 03/27/21 0803 Filed Weights   03/25/21 1842  Weight: 74.8 kg    Examination:  General exam: Appears calm and comfortable  Respiratory system: Clear to auscultation. Respiratory effort normal. Cardiovascular system: S1 & S2 heard, RRR. No JVD, murmurs, rubs, gallops or clicks. No pedal edema. Gastrointestinal system: Abdomen is nondistended, soft and nontender. No organomegaly or masses felt. Normal bowel sounds heard. Central nervous system: Alert and oriented. No focal neurological deficits. Extremities: Symmetric 5 x 5 power. Skin: No rashes, lesions or ulcers Psychiatry: Judgement and insight appear normal. Mood & affect appropriate.     Data Reviewed: I have  personally reviewed following labs and imaging studies  CBC: Recent Labs  Lab 03/25/21 1838  WBC 7.6  NEUTROABS 4.3  HGB 10.8*  HCT 35.0*  MCV 79.9*  PLT 235   Basic Metabolic Panel: Recent Labs  Lab 03/25/21 1838  NA 136  K 3.5  CL 101  CO2 28  GLUCOSE 179*  BUN 19  CREATININE 0.41*  CALCIUM 9.3   GFR: Estimated Creatinine Clearance: 64.2 mL/min  (A) (by C-G formula based on SCr of 0.41 mg/dL (L)). Liver Function Tests: No results for input(s): AST, ALT, ALKPHOS, BILITOT, PROT, ALBUMIN in the last 168 hours. No results for input(s): LIPASE, AMYLASE in the last 168 hours. No results for input(s): AMMONIA in the last 168 hours. Coagulation Profile: Recent Labs  Lab 03/26/21 0642  INR 1.1   Cardiac Enzymes: No results for input(s): CKTOTAL, CKMB, CKMBINDEX, TROPONINI in the last 168 hours. BNP (last 3 results) No results for input(s): PROBNP in the last 8760 hours. HbA1C: No results for input(s): HGBA1C in the last 72 hours. CBG: Recent Labs  Lab 03/26/21 1252 03/26/21 1714 03/26/21 2305 03/27/21 0736  GLUCAP 115* 115* 110* 134*   Lipid Profile: No results for input(s): CHOL, HDL, LDLCALC, TRIG, CHOLHDL, LDLDIRECT in the last 72 hours. Thyroid Function Tests: No results for input(s): TSH, T4TOTAL, FREET4, T3FREE, THYROIDAB in the last 72 hours. Anemia Panel: No results for input(s): VITAMINB12, FOLATE, FERRITIN, TIBC, IRON, RETICCTPCT in the last 72 hours. Sepsis Labs: Recent Labs  Lab 03/25/21 1838  LATICACIDVEN 1.2    Recent Results (from the past 240 hour(s))  Aerobic/Anaerobic Culture w Gram Stain (surgical/deep wound)     Status: None (Preliminary result)   Collection Time: 03/26/21  5:54 AM   Specimen: Sacral; Wound  Result Value Ref Range Status   Specimen Description   Final    SACRAL Performed at [facility], [address], [place], [place] [address]    Special Requests   Final    NONE Performed at [facility], [address] [place], [place] [address]    Gram Stain   Final    RARE WBC PRESENT,BOTH PMN AND MONONUCLEAR RARE GRAM POSITIVE COCCI IN PAIRS Performed at [facility], [place] [address], [place], [place] [address]    Culture PENDING  Incomplete   Report Status PENDING  Incomplete  Urine Culture     Status: None   Collection Time: 03/26/21  5:55 AM    Specimen: In/Out Cath Urine  Result Value Ref Range Status   Specimen Description   Final    IN/OUT CATH URINE Performed at [facility], [address], [place], [place] [address]    Special Requests   Final    NONE Performed at [facility], [address] [place], [place] [address]    Culture   Final    NO GROWTH Performed at [facility], [place] [address], [place], [place] [address]    Report Status 03/27/2021 FINAL  Final  Resp Panel by RT-PCR (Flu A&B, Covid) Nasopharyngeal Swab     Status: None   Collection Time: 03/26/21  5:58 AM   Specimen: Nasopharyngeal Swab; Nasopharyngeal(NP) swabs in vial transport medium  Result Value Ref Range Status   SARS Coronavirus 2 by RT PCR NEGATIVE NEGATIVE Final    Comment: (NOTE) SARS-CoV-2 target nucleic acids are NOT DETECTED.  The SARS-CoV-2 RNA is generally detectable in upper respiratory specimens during the acute phase of infection. The  lowest concentration of SARS-CoV-2 viral copies this assay can detect is 138 copies/mL. A negative result does not preclude SARS-Cov-2 infection and should not be used as the sole basis for treatment or other patient management decisions. A negative result may occur with  improper specimen collection/handling, submission of specimen other than nasopharyngeal swab, presence of viral mutation(s) within the areas targeted by this assay, and inadequate number of viral copies(<138 copies/mL). A negative result must be combined with clinical observations, patient history, and epidemiological information. The expected result is Negative.  Fact Sheet for Patients:  EntrepreneurPulse.com.au  Fact Sheet for Healthcare Providers:  IncredibleEmployment.be  This test is no t yet approved or cleared by the [place] FDA and  has been authorized for detection and/or diagnosis of SARS-CoV-2 by FDA under an Emergency Use Authorization (EUA). This  EUA will remain  in effect (meaning this test can be used) for the duration of the COVID-19 declaration under Section 564(b)(1) of the Act, 21 U.S.C.section 360bbb-3(b)(1), unless the authorization is terminated  or revoked sooner.       Influenza A by PCR NEGATIVE NEGATIVE Final   Influenza B by PCR NEGATIVE NEGATIVE Final    Comment: (NOTE) The Xpert Xpress SARS-CoV-2/FLU/RSV plus assay is intended as an aid in the diagnosis of influenza from Nasopharyngeal swab specimens and should not be used as a sole basis for treatment. Nasal washings and aspirates are unacceptable for Xpert Xpress SARS-CoV-2/FLU/RSV testing.  Fact Sheet for Patients: EntrepreneurPulse.com.au  Fact Sheet for Healthcare Providers: IncredibleEmployment.be  This test is not yet approved or cleared by the [place] FDA and has been authorized for detection and/or diagnosis of SARS-CoV-2 by FDA under an Emergency Use Authorization (EUA). This EUA will remain in effect (meaning this test can be used) for the duration of the COVID-19 declaration under Section 564(b)(1) of the Act, 21 U.S.C. section 360bbb-3(b)(1), unless the authorization is terminated or revoked.  Performed at [facility], [address] [place], [place] [address]   Blood Culture (routine x 2)     Status: None (Preliminary result)   Collection Time: 03/26/21  6:42 AM   Specimen: BLOOD  Result Value Ref Range Status   Specimen Description BLOOD RAC  Final   Special Requests   Final    BOTTLES DRAWN AEROBIC AND ANAEROBIC Blood Culture adequate volume   Culture   Final    NO GROWTH < 24 HOURS Performed at [facility], [address], [place], [place] [address]    Report Status PENDING  Incomplete  Blood Culture (routine x 2)     Status: None (Preliminary result)   Collection Time: 03/26/21  6:42 AM   Specimen: BLOOD  Result Value Ref Range Status   Specimen Description BLOOD  RAC  Final   Special Requests   Final    BOTTLES DRAWN AEROBIC AND ANAEROBIC Blood Culture results may not be optimal due to an excessive volume of blood received in culture bottles   Culture   Final    NO GROWTH < 24 HOURS Performed at [facility], [address] [place],  [address]    Report Status PENDING  Incomplete         Radiology Studies: MR PELVIS W WO CONTRAST  Result Date: 03/26/2021 CLINICAL DATA:  Worsening sacral wound.  Osteomyelitis suspected. EXAM: MRI PELVIS WITHOUT AND WITH CONTRAST TECHNIQUE: Multiplanar multisequence MR imaging of the pelvis was performed both before and after administration of intravenous contrast. CONTRAST:  7.61m  GADAVIST GADOBUTROL 1 MMOL/ML IV SOLN COMPARISON:  Pelvic CT 07/22/2020. FINDINGS: Despite efforts by the technologist and patient, mild motion artifact is present on today's exam and could not be eliminated. This reduces exam sensitivity and specificity. Urinary Tract: The visualized distal ureters and bladder appear unremarkable. A small amount of perinephric soft tissue stranding is noted bilaterally. Bowel: The rectosigmoid colon is moderately distended by a large amount of stool, similar to previous CT. No significant bowel wall thickening identified. Mild presacral soft tissue stranding appears similar to previous CT. Vascular/Lymphatic: No enlarged pelvic lymph nodes identified. Iliofemoral atherosclerosis, better seen on CT. No acute vascular findings identified. Reproductive: Stable left lateral displacement of the uterus by the distended rectosigmoid colon, unchanged from previous CT. No suspicious adnexal findings. Other: No ascites. Musculoskeletal: Chronic sacral decubitus ulcer with underlying edema in the subcutaneous fat superficial to the distal sacrum and coccyx. There is no drainable fluid collection. There is a small amount of presacral fluid which appears similar to previous CT. Chronic deformity of the coccyx  appears similar to previous CT. No definite progressive bone destruction or signs of active osteomyelitis. No significant hip or sacroiliac joint effusion. The level spondylosis noted. IMPRESSION: 1. Sacral decubitus ulceration with underlying subcutaneous and presacral inflammatory changes. No focal fluid collection. 2. Chronic deformity of the coccyx without definite signs of active osteomyelitis. 3. Severe fecal impaction of the rectosigmoid colon, similar to prior study. Electronically Signed   By: Richardean Sale M.D.   On: 03/26/2021 10:44    Scheduled Meds:  aspirin EC  81 mg Oral Daily   atorvastatin  20 mg Oral QHS   cholecalciferol  1,000 Units Oral Daily   heparin  5,000 Units Subcutaneous Q8H   insulin aspart  0-5 Units Subcutaneous QHS   insulin aspart  0-9 Units Subcutaneous TID WC   insulin glargine-yfgn  5 Units Subcutaneous Daily   lactulose  20 g Oral BID   polyethylene glycol  17 g Oral Daily   senna-docusate  2 tablet Oral BID   sodium hypochlorite   Irrigation Daily   traZODone  25 mg Oral QHS   Continuous Infusions:  piperacillin-tazobactam (ZOSYN)  IV 3.375 g (03/27/21 0106)   vancomycin      LOS: 0 days   Time spent: 63mn  Delford Wingert C Deasha Clendenin, DO [facility]  If 7PM-7AM, please contact night-coverage [email or web address]  03/27/2021, 8:03 AM

## 2021-03-28 ENCOUNTER — Encounter: Payer: Self-pay | Admitting: Internal Medicine

## 2021-03-28 DIAGNOSIS — S31000A Unspecified open wound of lower back and pelvis without penetration into retroperitoneum, initial encounter: Secondary | ICD-10-CM | POA: Diagnosis not present

## 2021-03-28 DIAGNOSIS — L03818 Cellulitis of other sites: Secondary | ICD-10-CM | POA: Diagnosis not present

## 2021-03-28 LAB — BASIC METABOLIC PANEL
Anion gap: 7 (ref 5–15)
BUN: 15 mg/dL (ref 8–23)
CO2: 25 mmol/L (ref 22–32)
Calcium: 9.1 mg/dL (ref 8.9–10.3)
Chloride: 102 mmol/L (ref 98–111)
Creatinine, Ser: 0.49 mg/dL (ref 0.44–1.00)
GFR, Estimated: >60 mL/min (ref >60)
Glucose, Bld: 157 mg/dL — ABNORMAL HIGH (ref 70–99)
Potassium: 3.7 mmol/L (ref 3.5–5.1)
Sodium: 134 mmol/L — ABNORMAL LOW (ref 135–145)

## 2021-03-28 LAB — CBC
HCT: 35.1 % — ABNORMAL LOW (ref 36.0–46.0)
Hemoglobin: 10.9 g/dL — ABNORMAL LOW (ref 12.0–15.0)
MCH: 24.3 pg — ABNORMAL LOW (ref 26.0–34.0)
MCHC: 31.1 g/dL (ref 30.0–36.0)
MCV: 78.3 fL — ABNORMAL LOW (ref 80.0–100.0)
Platelets: 305 10*3/uL (ref 150–400)
RBC: 4.48 MIL/uL (ref 3.87–5.11)
RDW: 15.7 % — ABNORMAL HIGH (ref 11.5–15.5)
WBC: 11.3 10*3/uL — ABNORMAL HIGH (ref 4.0–10.5)
nRBC: 0 % (ref 0.0–0.2)

## 2021-03-28 LAB — GLUCOSE, CAPILLARY
Glucose-Capillary: 155 mg/dL — ABNORMAL HIGH (ref 70–99)
Glucose-Capillary: 251 mg/dL — ABNORMAL HIGH (ref 70–99)
Glucose-Capillary: 177 mg/dL — ABNORMAL HIGH (ref 70–99)
Glucose-Capillary: 167 mg/dL — ABNORMAL HIGH (ref 70–99)

## 2021-03-28 MED ORDER — AMOXICILLIN-POT CLAVULANATE 875-125 MG PO TABS
1.0000 | ORAL_TABLET | Freq: Two times a day (BID) | ORAL | Status: DC
  Administered 2021-03-28 – 2021-03-29 (×3): 1 via ORAL
  Filled 2021-03-28 (×3): qty 1

## 2021-03-28 MED ORDER — COLLAGENASE 250 UNIT/GM EX OINT
TOPICAL_OINTMENT | Freq: Every day | CUTANEOUS | Status: DC
  Filled 2021-03-28: qty 30

## 2021-03-28 NOTE — Plan of Care (Signed)
°  Problem: Health Behavior/Discharge Planning: Goal: Ability to manage health-related needs will improve Outcome: Progressing   Problem: Clinical Measurements: Goal: Will remain free from infection Outcome: Progressing   Problem: Nutrition: Goal: Adequate nutrition will be maintained Outcome: Progressing   Problem: Elimination: Goal: Will not experience complications related to bowel motility Outcome: Progressing Goal: Will not experience complications related to urinary retention Outcome: Progressing   Problem: Pain Managment: Goal: General experience of comfort will improve Outcome: Progressing   Problem: Education: Goal: Knowledge of General Education information will improve Description: Including pain rating scale, medication(s)/side effects and non-pharmacologic comfort measures Outcome: Progressing   Problem: Health Behavior/Discharge Planning: Goal: Ability to manage health-related needs will improve Outcome: Progressing   Problem: Clinical Measurements: Goal: Ability to maintain clinical measurements within normal limits will improve Outcome: Progressing Goal: Will remain free from infection Outcome: Progressing Goal: Diagnostic test results will improve Outcome: Progressing Goal: Respiratory complications will improve Outcome: Progressing Goal: Cardiovascular complication will be avoided Outcome: Progressing   Problem: Activity: Goal: Risk for activity intolerance will decrease Outcome: Progressing   Problem: Nutrition: Goal: Adequate nutrition will be maintained Outcome: Progressing   Problem: Coping: Goal: Level of anxiety will decrease Outcome: Progressing   Problem: Elimination: Goal: Will not experience complications related to bowel motility Outcome: Progressing Goal: Will not experience complications related to urinary retention Outcome: Progressing   Problem: Pain Managment: Goal: General experience of comfort will improve Outcome:  Progressing   Problem: Safety: Goal: Ability to remain free from injury will improve Outcome: Progressing   Problem: Skin Integrity: Goal: Risk for impaired skin integrity will decrease Outcome: Progressing

## 2021-03-28 NOTE — Consult Note (Addendum)
WOC Nurse wound follow up Consult performed remotely after review of progress notes.  Refer to previous consult note from [facility] team on 12/28.  Dakins order expires today.  Surgical team assessed sacrum Unstageable wound on 12/28 and does not plan further interventions.   Dressing procedure/placement/frequency: Topical treatment orders provided for bedside nurses to perform as follows to assist with enzymatic debridement: Cleanse sacral wound with NS and pat gently dry. Apply Santyl Q day and moist gauze and cover foam dressing. (Change foam dressing Q 3 days or PRN soiling.) Please re-consult if further assistance is needed.  Thank-you,  Cammie Mcgee MSN, RN, CWOCN, [place], CNS [phone]

## 2021-03-28 NOTE — TOC Initial Note (Signed)
Transition of Care [facility]) - Initial/Assessment Note    Patient Details  Name: Molly [name] MRN: [id number] Date of Birth: 08/28/1945  Transition of Care [facility]) CM/SW Contact:    Chapman Fitch, RN Phone Number: 03/28/2021, 4:22 PM  Clinical Narrative:                 Admitted for: Sacral wound with infection/cellulitis Admitted from: Home with husband PCP: Su Hilt  Pharmacy:[facility] Current home health/prior home health/DME: Per Husband patient "has all the equipment she could ever need"  Husband agreeable to home health services states he does not have a preference of home health agency.  Referral made to cory with [facility].  He is to review   Expected Discharge Plan: Home w Home Health Services Barriers to Discharge: Continued Medical Work up   Patient Goals and CMS Choice        Expected Discharge Plan and Services Expected Discharge Plan: Home w Home Health Services   Discharge Planning Services: CM Consult   Living arrangements for the past 2 months: Single Family Home                           HH Arranged: RN [facility] Agency: [facility] Date [facility] Agency Contacted: 03/28/21   Representative spoke with at [facility] Agency: Kandee Keen  Prior Living Arrangements/Services Living arrangements for the past 2 months: Single Family Home Lives with:: Spouse Patient language and need for interpreter reviewed:: Yes Do you feel safe going back to the place where you live?: Yes      Need for Family Participation in Patient Care: Yes (Comment) Care giver support system in place?: Yes (comment) Current home services: DME Criminal Activity/Legal Involvement Pertinent to Current Situation/Hospitalization: No - Comment as needed  Activities of Daily Living Home Assistive Devices/Equipment: None ADL Screening (condition at time of admission) Patient's cognitive ability adequate to safely complete daily activities?: No Is the patient deaf or have difficulty hearing?:  No Does the patient have difficulty seeing, even when wearing glasses/contacts?: Yes Does the patient have difficulty concentrating, remembering, or making decisions?: No Patient able to express need for assistance with ADLs?: Yes Does the patient have difficulty dressing or bathing?: Yes Independently performs ADLs?: No Communication: Independent Dressing (OT): Needs assistance Is this a change from baseline?: Pre-admission baseline Grooming: Dependent Is this a change from baseline?: Pre-admission baseline Feeding: Needs assistance Is this a change from baseline?: Pre-admission baseline Bathing: Dependent Is this a change from baseline?: Pre-admission baseline Toileting: Dependent Is this a change from baseline?: Pre-admission baseline In/Out Bed: Dependent Is this a change from baseline?: Pre-admission baseline Walks in Home: Dependent Is this a change from baseline?: Pre-admission baseline Does the patient have difficulty walking or climbing stairs?: Yes Weakness of Legs: Both Weakness of Arms/Hands: Both  Permission Sought/Granted                  Emotional Assessment       Orientation: : Oriented to Self, Oriented to Place Alcohol / Substance Use: Not Applicable Psych Involvement: No (comment)  Admission diagnosis:  Wound infection [T14.8XXA, L08.9] Left hemiplegia (HCC) [G81.94] Sacral wound [S31.000A] Sacral wound, initial encounter [S31.000A] Urinary tract infection without hematuria, site unspecified [N39.0] Patient Active Problem List   Diagnosis Date Noted   Sacral wound 03/26/2021   Wound infection 03/26/2021   UTI (urinary tract infection) 03/26/2021   Microcytic anemia 03/26/2021   Constipation 07/24/2020   Pressure  injury of skin 07/23/2020   Megacolon 07/22/2020   Obesity, diabetes, and hypertension syndrome ([place])    Vaginal bleeding    Acute urinary retention    Acute metabolic encephalopathy    Rectal bleeding    Weakness    Lethargy     History of completed stroke    GI bleeding 12/27/2019   Lower GI bleed 12/27/2019   C. 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   Cellulitis of left lower extremity 11/19/2014   COPD (chronic obstructive pulmonary disease) ([place]) 11/19/2014   Essential hypertension 11/19/2014   HLD (hyperlipidemia) 11/19/2014   Sepsis ([place]) 11/19/2014   Type 2 diabetes mellitus with hyperlipidemia ([place]) 05/21/2014   Chronic anemia 05/21/2014   PCP:  Josephine Cables, MD Pharmacy:   [facility], [place] [address] Phone: [phone] Fax: [facility] [address], [place] [address] Phone: [phone] Fax: [phone]     Social Determinants of Health (SDOH) Interventions    Readmission Risk Interventions Readmission Risk Prevention Plan 12/13/2019  Transportation Screening Complete  Palliative Care Screening Not Applicable  Medication Review (RN Care Manager) Complete  Some recent data might be hidden

## 2021-03-28 NOTE — Progress Notes (Signed)
PROGRESS NOTE    Molly [name]  KVQ:[id number] DOB: April 28, 1945 DOA: 03/26/2021 PCP: Josephine Cables, MD   Brief Narrative:  Molly [name] is a 75 y.o. female with medical history significant of stroke with left-sided paraplegia, bedbound, hypertension, hyperlipidemia, diabetes mellitus, COPD, depression, GI bleeding, anemia, C. difficile colitis, megacolon, who presents with sacral wound with infection.   Patient has a chronic sacral wound for several months, which has been progressively worsening in the past several weeks per family who is primarily in charge of her wound care.  Reported purulence and maggots were seen per family but none were documented at intake.  Assessment & Plan:  Sacral wound with infection/cellulitis, POA:  Patient does not meet sepsis criteria  MRI is without osteomyelitis No indication for debridement per surgery consult Transition to p.o. Augmentin, if tolerating well and improving over the next 24 hours we will likely consider discharge home with outpatient follow-up with wound care  ESR within normal limits Blood cultures pending; wound culture notable for staph and E. coli Wound care following, may benefit from outpatient evaluation  Questionable UTI (urinary tract infection) -Covered by antibiotics as above -Follow-up blood culture  Type 2 diabetes mellitus with hyperlipidemia [facility]):  Recent A1c 5.7, well controlled.  Patient taking metformin, NovoLog and Tresiba 10 units daily at home -Continue sliding scale insulin and long-acting insulin as well as hypoglycemic protocol while admitted   COPD (chronic obstructive pulmonary disease) Stable; continue home bronchodilators   Essential hypertension: No medication on her list -IV hydralazine as needed   HLD (hyperlipidemia) -Lipitor   History of completed stroke -Lipitor and aspirin    Microcytic anemia: Hemoglobin 11.8 on 08/23/2020 --> 10.8, slightly dropped. -Follow-up with CBC    Constipation: -Continue Dulcolax MiraLAX Senokot as needed  -Lactulose 20 g twice daily   DVT prophylaxis: Heparin Code Status: Full Family Communication: At bedside  Status is: Inpatient  Dispo: The patient is from: Home              Anticipated d/c is to: Home              Anticipated d/c date is: 24 to 48 hours              Patient currently not medically stable for discharge  Consultants:  General surgery  Procedures:  None  Antimicrobials:  Vancomycin, Zosyn  Subjective: No acute issues or events overnight denies nausea vomiting diarrhea constipation headache fevers chills or chest pain  Objective: Vitals:   03/27/21 0735 03/27/21 1552 03/27/21 1935 03/28/21 0519  BP: 125/67 118/71 124/65 114/62  Pulse: 79 85 74 91  Resp: '18 18 20 16  ' Temp: 98.6 F (37 C) 98.6 F (37 C) 97.7 F (36.5 C) 98.5 F (36.9 C)  TempSrc: Oral  Oral Oral  SpO2: 95% 95% 96% 93%  Weight:      Height:        Intake/Output Summary (Last 24 hours) at 03/28/2021 0738 Last data filed at 03/27/2021 1848 Gross per 24 hour  Intake 480 ml  Output --  Net 480 ml   Filed Weights   03/25/21 1842  Weight: 74.8 kg    Examination:  General exam: Appears calm and comfortable  Respiratory system: Clear to auscultation. Respiratory effort normal. Cardiovascular system: S1 & S2 heard, RRR. No JVD, murmurs, rubs, gallops or clicks. No pedal edema. Gastrointestinal system: Abdomen is nondistended, soft and nontender. No organomegaly or masses felt. Normal bowel sounds heard. Central nervous system:  Alert and oriented. No focal neurological deficits. Extremities: Symmetric 5 x 5 power. Skin: No rashes, lesions or ulcers Psychiatry: Judgement and insight appear normal. Mood & affect appropriate.     Data Reviewed: I have personally reviewed following labs and imaging studies  CBC: Recent Labs  Lab 03/25/21 1838 03/27/21 0721 03/28/21 0448  WBC 7.6 9.2 11.3*  NEUTROABS 4.3  --   --    HGB 10.8* 11.3* 10.9*  HCT 35.0* 36.5 35.1*  MCV 79.9* 79.3* 78.3*  PLT 285 332 749    Basic Metabolic Panel: Recent Labs  Lab 03/25/21 1838 03/27/21 0721 03/28/21 0448  NA 136  --  134*  K 3.5  --  3.7  CL 101  --  102  CO2 28  --  25  GLUCOSE 179*  --  157*  BUN 19  --  15  CREATININE 0.41* 0.44 0.49  CALCIUM 9.3  --  9.1    GFR: Estimated Creatinine Clearance: 64.2 mL/min (by C-G formula based on SCr of 0.49 mg/dL). Liver Function Tests: No results for input(s): AST, ALT, ALKPHOS, BILITOT, PROT, ALBUMIN in the last 168 hours. No results for input(s): LIPASE, AMYLASE in the last 168 hours. No results for input(s): AMMONIA in the last 168 hours. Coagulation Profile: Recent Labs  Lab 03/26/21 0642  INR 1.1    Cardiac Enzymes: No results for input(s): CKTOTAL, CKMB, CKMBINDEX, TROPONINI in the last 168 hours. BNP (last 3 results) No results for input(s): PROBNP in the last 8760 hours. HbA1C: No results for input(s): HGBA1C in the last 72 hours. CBG: Recent Labs  Lab 03/26/21 2305 03/27/21 0736 03/27/21 1154 03/27/21 1624 03/27/21 2045  GLUCAP 110* 134* 198* 191* 135*    Lipid Profile: No results for input(s): CHOL, HDL, LDLCALC, TRIG, CHOLHDL, LDLDIRECT in the last 72 hours. Thyroid Function Tests: No results for input(s): TSH, T4TOTAL, FREET4, T3FREE, THYROIDAB in the last 72 hours. Anemia Panel: No results for input(s): VITAMINB12, FOLATE, FERRITIN, TIBC, IRON, RETICCTPCT in the last 72 hours. Sepsis Labs: Recent Labs  Lab 03/25/21 1838  LATICACIDVEN 1.2     Recent Results (from the past 240 hour(s))  Aerobic/Anaerobic Culture w Gram Stain (surgical/deep wound)     Status: None (Preliminary result)   Collection Time: 03/26/21  5:54 AM   Specimen: Sacral; Wound  Result Value Ref Range Status   Specimen Description   Final    SACRAL Performed at [facility], [address], [place], [place] [address]    Special Requests   Final     NONE Performed at [facility], [address] [place], [place] [address]    Gram Stain   Final    RARE WBC PRESENT,BOTH PMN AND MONONUCLEAR RARE GRAM POSITIVE COCCI IN PAIRS Performed at [facility], [place] [address], [place], [place] [address]    Culture   Final    MODERATE STAPHYLOCOCCUS AUREUS SUSCEPTIBILITIES TO FOLLOW RARE GRAM NEGATIVE RODS    Report Status PENDING  Incomplete  Urine Culture     Status: None   Collection Time: 03/26/21  5:55 AM   Specimen: In/Out Cath Urine  Result Value Ref Range Status   Specimen Description   Final    IN/OUT CATH URINE Performed at [facility], [address], [place], [place] [address]    Special Requests   Final    NONE Performed at [facility], [address], [place], [place] [address]    Culture  Final    NO GROWTH Performed at [facility], [place] [address], [place], [place] [address]    Report Status 03/27/2021 FINAL  Final  Resp Panel by RT-PCR (Flu A&B, Covid) Nasopharyngeal Swab     Status: None   Collection Time: 03/26/21  5:58 AM   Specimen: Nasopharyngeal Swab; Nasopharyngeal(NP) swabs in vial transport medium  Result Value Ref Range Status   SARS Coronavirus 2 by RT PCR NEGATIVE NEGATIVE Final    Comment: (NOTE) SARS-CoV-2 target nucleic acids are NOT DETECTED.  The SARS-CoV-2 RNA is generally detectable in upper respiratory specimens during the acute phase of infection. The lowest concentration of SARS-CoV-2 viral copies this assay can detect is 138 copies/mL. A negative result does not preclude SARS-Cov-2 infection and should not be used as the sole basis for treatment or other patient management decisions. A negative result may occur with  improper specimen collection/handling, submission of specimen other than nasopharyngeal swab, presence of viral mutation(s) within the areas targeted by this assay, and inadequate number of viral copies(<138 copies/mL). A negative  result must be combined with clinical observations, patient history, and epidemiological information. The expected result is Negative.  Fact Sheet for Patients:  EntrepreneurPulse.com.au  Fact Sheet for Healthcare Providers:  IncredibleEmployment.be  This test is no t yet approved or cleared by the [place] FDA and  has been authorized for detection and/or diagnosis of SARS-CoV-2 by FDA under an Emergency Use Authorization (EUA). This EUA will remain  in effect (meaning this test can be used) for the duration of the COVID-19 declaration under Section 564(b)(1) of the Act, 21 U.S.C.section 360bbb-3(b)(1), unless the authorization is terminated  or revoked sooner.       Influenza A by PCR NEGATIVE NEGATIVE Final   Influenza B by PCR NEGATIVE NEGATIVE Final    Comment: (NOTE) The Xpert Xpress SARS-CoV-2/FLU/RSV plus assay is intended as an aid in the diagnosis of influenza from Nasopharyngeal swab specimens and should not be used as a sole basis for treatment. Nasal washings and aspirates are unacceptable for Xpert Xpress SARS-CoV-2/FLU/RSV testing.  Fact Sheet for Patients: EntrepreneurPulse.com.au  Fact Sheet for Healthcare Providers: IncredibleEmployment.be  This test is not yet approved or cleared by the [place] FDA and has been authorized for detection and/or diagnosis of SARS-CoV-2 by FDA under an Emergency Use Authorization (EUA). This EUA will remain in effect (meaning this test can be used) for the duration of the COVID-19 declaration under Section 564(b)(1) of the Act, 21 U.S.C. section 360bbb-3(b)(1), unless the authorization is terminated or revoked.  Performed at [facility], [address] [place], [place] [address]   Blood Culture (routine x 2)     Status: None (Preliminary result)   Collection Time: 03/26/21  6:42 AM   Specimen: BLOOD  Result Value Ref Range  Status   Specimen Description BLOOD RAC  Final   Special Requests   Final    BOTTLES DRAWN AEROBIC AND ANAEROBIC Blood Culture adequate volume   Culture   Final    NO GROWTH 2 DAYS Performed at [facility], [address] [place], [place] [address]    Report Status PENDING  Incomplete  Blood Culture (routine x 2)     Status: None (Preliminary result)   Collection Time: 03/26/21  6:42 AM   Specimen: BLOOD  Result Value Ref Range Status   Specimen Description BLOOD RAC  Final   Special Requests   Final    BOTTLES DRAWN AEROBIC AND  ANAEROBIC Blood Culture results may not be optimal due to an excessive volume of blood received in culture bottles   Culture   Final    NO GROWTH 2 DAYS Performed at [facility], [address], [place], [place] [address]    Report Status PENDING  Incomplete          Radiology Studies: MR PELVIS W WO CONTRAST  Result Date: 03/26/2021 CLINICAL DATA:  Worsening sacral wound.  Osteomyelitis suspected. EXAM: MRI PELVIS WITHOUT AND WITH CONTRAST TECHNIQUE: Multiplanar multisequence MR imaging of the pelvis was performed both before and after administration of intravenous contrast. CONTRAST:  7.5m GADAVIST GADOBUTROL 1 MMOL/ML IV SOLN COMPARISON:  Pelvic CT 07/22/2020. FINDINGS: Despite efforts by the technologist and patient, mild motion artifact is present on today's exam and could not be eliminated. This reduces exam sensitivity and specificity. Urinary Tract: The visualized distal ureters and bladder appear unremarkable. A small amount of perinephric soft tissue stranding is noted bilaterally. Bowel: The rectosigmoid colon is moderately distended by a large amount of stool, similar to previous CT. No significant bowel wall thickening identified. Mild presacral soft tissue stranding appears similar to previous CT. Vascular/Lymphatic: No enlarged pelvic lymph nodes identified. Iliofemoral atherosclerosis, better seen on CT. No acute  vascular findings identified. Reproductive: Stable left lateral displacement of the uterus by the distended rectosigmoid colon, unchanged from previous CT. No suspicious adnexal findings. Other: No ascites. Musculoskeletal: Chronic sacral decubitus ulcer with underlying edema in the subcutaneous fat superficial to the distal sacrum and coccyx. There is no drainable fluid collection. There is a small amount of presacral fluid which appears similar to previous CT. Chronic deformity of the coccyx appears similar to previous CT. No definite progressive bone destruction or signs of active osteomyelitis. No significant hip or sacroiliac joint effusion. The level spondylosis noted. IMPRESSION: 1. Sacral decubitus ulceration with underlying subcutaneous and presacral inflammatory changes. No focal fluid collection. 2. Chronic deformity of the coccyx without definite signs of active osteomyelitis. 3. Severe fecal impaction of the rectosigmoid colon, similar to prior study. Electronically Signed   By: WRichardean SaleM.D.   On: 03/26/2021 10:44    Scheduled Meds:  aspirin EC  81 mg Oral Daily   atorvastatin  20 mg Oral QHS   cholecalciferol  1,000 Units Oral Daily   heparin  5,000 Units Subcutaneous Q8H   insulin aspart  0-5 Units Subcutaneous QHS   insulin aspart  0-9 Units Subcutaneous TID WC   insulin glargine-yfgn  5 Units Subcutaneous Daily   lactulose  20 g Oral BID   polyethylene glycol  17 g Oral Daily   senna-docusate  2 tablet Oral BID   sodium hypochlorite   Irrigation Daily   traZODone  25 mg Oral QHS   Continuous Infusions:  piperacillin-tazobactam (ZOSYN)  IV 3.375 g (03/28/21 0030)   vancomycin 1,500 mg (03/27/21 1133)    LOS: 1 day   Time spent: 446m  Camryn Quesinberry C Giorgi Debruin, DO [facility]  If 7PM-7AM, please contact night-coverage [email or web address]  03/28/2021, 7:38 AM

## 2021-03-29 DIAGNOSIS — S31000A Unspecified open wound of lower back and pelvis without penetration into retroperitoneum, initial encounter: Secondary | ICD-10-CM | POA: Diagnosis not present

## 2021-03-29 DIAGNOSIS — L03818 Cellulitis of other sites: Secondary | ICD-10-CM | POA: Diagnosis not present

## 2021-03-29 LAB — BASIC METABOLIC PANEL
Anion gap: 5 (ref 5–15)
BUN: 12 mg/dL (ref 8–23)
CO2: 26 mmol/L (ref 22–32)
Calcium: 9.2 mg/dL (ref 8.9–10.3)
Chloride: 104 mmol/L (ref 98–111)
Creatinine, Ser: 0.34 mg/dL — ABNORMAL LOW (ref 0.44–1.00)
GFR, Estimated: >60 mL/min (ref >60)
Glucose, Bld: 148 mg/dL — ABNORMAL HIGH (ref 70–99)
Potassium: 3.2 mmol/L — ABNORMAL LOW (ref 3.5–5.1)
Sodium: 135 mmol/L (ref 135–145)

## 2021-03-29 LAB — CBC
HCT: 35.4 % — ABNORMAL LOW (ref 36.0–46.0)
Hemoglobin: 11 g/dL — ABNORMAL LOW (ref 12.0–15.0)
MCH: 24.3 pg — ABNORMAL LOW (ref 26.0–34.0)
MCHC: 31.1 g/dL (ref 30.0–36.0)
MCV: 78.3 fL — ABNORMAL LOW (ref 80.0–100.0)
Platelets: 352 10*3/uL (ref 150–400)
RBC: 4.52 MIL/uL (ref 3.87–5.11)
RDW: 15.8 % — ABNORMAL HIGH (ref 11.5–15.5)
WBC: 9.1 10*3/uL (ref 4.0–10.5)
nRBC: 0 % (ref 0.0–0.2)

## 2021-03-29 LAB — GLUCOSE, CAPILLARY: Glucose-Capillary: 152 mg/dL — ABNORMAL HIGH (ref 70–99)

## 2021-03-29 MED ORDER — COLLAGENASE 250 UNIT/GM EX OINT: TOPICAL_OINTMENT | Freq: Every day | CUTANEOUS | 0 refills | Status: DC

## 2021-03-29 MED ORDER — AMOXICILLIN-POT CLAVULANATE 875-125 MG PO TABS: 1.0000 | ORAL_TABLET | Freq: Two times a day (BID) | ORAL | 0 refills | Status: DC

## 2021-03-29 NOTE — Progress Notes (Signed)
Ardis Hughs to be D/C'd Home with home health per MD order.  Discussed with the patient and all questions fully answered.  IV catheter discontinued intact. Site without signs and symptoms of complications. Dressing and pressure applied.  An After Visit Summary was printed and given to the patient. Prescriptions sent to patient pharmacy.  D/c education completed with patient/family including follow up instructions, medication list, d/c activities limitations if indicated, with other d/c instructions as indicated by MD - patient able to verbalize understanding, all questions fully answered.   Patient instructed to return to ED, call 911, or call MD for any changes in condition.   Patient escorted via stretcher, and D/C home via [facility].  Pauletta Browns 03/29/2021 12:15 PM

## 2021-03-29 NOTE — Discharge Summary (Signed)
Physician Discharge Summary  Molly [name] [id number] DOB: 15-Feb-1946 DOA: 03/26/2021  PCP: Josephine Cables, MD  Admit date: 03/26/2021 Discharge date: 03/29/2021  Admitted From: Home Disposition: Home  Recommendations for Outpatient Follow-up:  Follow up with PCP in 1-2 weeks Please obtain BMP/CBC in one week Please follow up with wound care as discussed  Home Health: Nursing, PT OT Equipment/Devices: None  Discharge Condition: Stable CODE STATUS: Full Diet recommendation: Low-salt low-fat diabetic diet  Brief/Interim Summary: Molly [name] is a 75 y.o. female with medical history significant of stroke with left-sided paraplegia, bedbound, hypertension, hyperlipidemia, diabetes mellitus, COPD, depression, GI bleeding, anemia, C. difficile colitis, megacolon, who presents with sacral wound with infection. Patient has a chronic sacral wound for several months, which has been progressively worsening in the past several weeks per family who is primarily in charge of her wound care. Reported purulence and maggots were seen per family but none were documented at intake.   Assessment & Plan:   Sacral wound with infection/cellulitis, POA, resolving Patient does not meet sepsis criteria  MRI is without osteomyelitis No indication for debridement per surgery consult Cultures remain negative, transition to p.o. Augmentin for remainder of antibiotic course, follow-up with PCP in the next 1 to 2 weeks to ensure resolution of wound, home nursing wound care to set up this week per case management   Questionable UTI (urinary tract infection) -No symptoms reported, antibiotics to cover potential UTI   Type 2 diabetes mellitus with hyperlipidemia [facility]):  Recent A1c 5.7, well controlled.  Resume home medications, no changes during hospital stay   COPD (chronic obstructive pulmonary disease) Stable; continue home bronchodilators   Essential hypertension: No medication on her  list -IV hydralazine as needed   HLD (hyperlipidemia) -Lipitor   History of completed stroke -Lipitor and aspirin    Microcytic anemia: Hemoglobin 11.8 on 08/23/2020 --> 10.8, slightly dropped. -Follow-up with CBC   Constipation, resolved: -Continue Dulcolax MiraLAX/Senokot as needed   Discharge Instructions   Allergies as of 03/29/2021   No Known Allergies      Medication List     STOP taking these medications    amitriptyline 50 MG tablet Commonly known as: ELAVIL   lactulose 10 GM/15ML solution Commonly known as: CHRONULAC   mupirocin ointment 2 % Commonly known as: BACTROBAN       TAKE these medications    acetaminophen 325 MG tablet Commonly known as: TYLENOL Take 650 mg by mouth every 6 (six) hours as needed for moderate pain or mild pain.   albuterol 108 (90 Base) MCG/ACT inhaler Commonly known as: VENTOLIN HFA Inhale 2 puffs into the lungs every 6 (six) hours as needed for shortness of breath. Shake well. Wait 1 minute between puffs.   amoxicillin-clavulanate 875-125 MG tablet Commonly known as: AUGMENTIN Take 1 tablet by mouth every 12 (twelve) hours.   aspirin EC 81 MG tablet Take 81 mg by mouth daily.   atorvastatin 20 MG tablet Commonly known as: LIPITOR Take 20 mg by mouth daily at 6 PM.   bisacodyl 10 MG suppository Commonly known as: DULCOLAX Place 10 mg rectally every 3 (three) days. Hold for loose stool   collagenase ointment Commonly known as: SANTYL Apply topically daily.   diclofenac sodium 1 % Gel Commonly known as: VOLTAREN Apply 2 g topically 4 (four) times daily. To each shoulders   Generlac 10 GM/15ML Soln Generic drug: lactulose (encephalopathy) Take 15 mLs by mouth daily. For constipation. Note days given.   HumaLOG  100 UNIT/ML injection Generic drug: insulin lispro Inject 1-6 Units into the skin See admin instructions. Sliding scale. Sugar 150-199=1U, 200-249=2U, 250-299=3U, 300-349=4U, 350-400=5U, >400=6U and  call provider   metFORMIN 500 MG tablet Commonly known as: GLUCOPHAGE Take 500 mg by mouth 2 (two) times daily.   OXYGEN Inhale 2 L into the lungs daily as needed (to keep O2 stats >90%).   polyethylene glycol 17 g packet Commonly known as: MIRALAX / GLYCOLAX Take 17 g by mouth daily. Dissolved in 4 - 9 ounces of liquid. Hold for loose stools.   senna-docusate 8.6-50 MG tablet Commonly known as: Senokot-S Take 2 tablets by mouth 2 (two) times daily.   simethicone 80 MG chewable tablet Commonly known as: MYLICON Chew 0000000 mg by mouth daily as needed for flatulence.   traZODone 50 MG tablet Commonly known as: DESYREL Take 25 mg by mouth at bedtime.   Tyler Aas FlexTouch 100 UNIT/ML FlexTouch Pen Generic drug: insulin degludec Inject 10 Units into the skin daily.   Vitamin D3 25 MCG (1000 UT) Caps Take 1,000 Units by mouth daily.        No Known Allergies  Consultations: General surgery  Procedures/Studies: MR PELVIS W WO CONTRAST  Result Date: 03/26/2021 CLINICAL DATA:  Worsening sacral wound.  Osteomyelitis suspected. EXAM: MRI PELVIS WITHOUT AND WITH CONTRAST TECHNIQUE: Multiplanar multisequence MR imaging of the pelvis was performed both before and after administration of intravenous contrast. CONTRAST:  7.65mL GADAVIST GADOBUTROL 1 MMOL/ML IV SOLN COMPARISON:  Pelvic CT 07/22/2020. FINDINGS: Despite efforts by the technologist and patient, mild motion artifact is present on today's exam and could not be eliminated. This reduces exam sensitivity and specificity. Urinary Tract: The visualized distal ureters and bladder appear unremarkable. A small amount of perinephric soft tissue stranding is noted bilaterally. Bowel: The rectosigmoid colon is moderately distended by a large amount of stool, similar to previous CT. No significant bowel wall thickening identified. Mild presacral soft tissue stranding appears similar to previous CT. Vascular/Lymphatic: No enlarged pelvic lymph  nodes identified. Iliofemoral atherosclerosis, better seen on CT. No acute vascular findings identified. Reproductive: Stable left lateral displacement of the uterus by the distended rectosigmoid colon, unchanged from previous CT. No suspicious adnexal findings. Other: No ascites. Musculoskeletal: Chronic sacral decubitus ulcer with underlying edema in the subcutaneous fat superficial to the distal sacrum and coccyx. There is no drainable fluid collection. There is a small amount of presacral fluid which appears similar to previous CT. Chronic deformity of the coccyx appears similar to previous CT. No definite progressive bone destruction or signs of active osteomyelitis. No significant hip or sacroiliac joint effusion. The level spondylosis noted. IMPRESSION: 1. Sacral decubitus ulceration with underlying subcutaneous and presacral inflammatory changes. No focal fluid collection. 2. Chronic deformity of the coccyx without definite signs of active osteomyelitis. 3. Severe fecal impaction of the rectosigmoid colon, similar to prior study. Electronically Signed   By: Richardean Sale M.D.   On: 03/26/2021 10:44     Subjective: No acute issues or events overnight denies nausea vomiting diarrhea constipation headache fevers chills or chest pain   Discharge Exam: Vitals:   03/28/21 1924 03/29/21 0533  BP: 126/71 106/74  Pulse: 76 91  Resp: 18 20  Temp: 98.6 F (37 C) 98.2 F (36.8 C)  SpO2: 95% 95%   Vitals:   03/28/21 0757 03/28/21 1507 03/28/21 1924 03/29/21 0533  BP: (!) 141/80 111/64 126/71 106/74  Pulse: 89 78 76 91  Resp: 20 20 18  20  Temp: 98.2 F (36.8 C) 98.4 F (36.9 C) 98.6 F (37 C) 98.2 F (36.8 C)  TempSrc: Oral Oral  Oral  SpO2: 96% 95% 95% 95%  Weight:      Height:       General:  Pleasantly resting in bed, No acute distress. HEENT:  Normocephalic atraumatic.  Sclerae nonicteric, noninjected.  Extraocular movements intact bilaterally. Neck:  Without mass or deformity.   Trachea is midline. Lungs:  Clear to auscultate bilaterally without rhonchi, wheeze, or rales. Heart:  Regular rate and rhythm.  Without murmurs, rubs, or gallops. Abdomen:  Soft, nontender, nondistended.  Without guarding or rebound. Extremities: Without cyanosis, clubbing, edema, or obvious deformity. Vascular:  Dorsalis pedis and posterior tibial pulses palpable bilaterally. Skin:  Warm and dry, no erythema, small sacral decubitus ulcer as above, bandage clean dry intact  The results of significant diagnostics from this hospitalization (including imaging, microbiology, ancillary and laboratory) are listed below for reference.     Microbiology: Recent Results (from the past 240 hour(s))  Aerobic/Anaerobic Culture w Gram Stain (surgical/deep wound)     Status: None (Preliminary result)   Collection Time: 03/26/21  5:54 AM   Specimen: Sacral; Wound  Result Value Ref Range Status   Specimen Description   Final    SACRAL Performed at [facility], [address] [place], [place] [address]    Special Requests   Final    NONE Performed at [facility], [address] [place], [place] [address]    Gram Stain   Final    RARE WBC PRESENT,BOTH PMN AND MONONUCLEAR RARE GRAM POSITIVE COCCI IN PAIRS Performed at [facility], [place] [address] [place], [place] [address]    Culture   Final    MODERATE STAPHYLOCOCCUS AUREUS RARE ESCHERICHIA COLI NO ANAEROBES ISOLATED; CULTURE IN PROGRESS FOR 5 DAYS    Report Status PENDING  Incomplete   Organism ID, Bacteria STAPHYLOCOCCUS AUREUS  Final   Organism ID, Bacteria ESCHERICHIA COLI  Final      Susceptibility   Escherichia coli - MIC*    AMPICILLIN 8 SENSITIVE Sensitive     CEFAZOLIN <=4 SENSITIVE Sensitive     CEFEPIME <=0.12 SENSITIVE Sensitive     CEFTAZIDIME <=1 SENSITIVE Sensitive     CEFTRIAXONE <=0.25 SENSITIVE Sensitive     CIPROFLOXACIN <=0.25 SENSITIVE Sensitive     GENTAMICIN <=1 SENSITIVE Sensitive      IMIPENEM <=0.25 SENSITIVE Sensitive     TRIMETH/SULFA <=20 SENSITIVE Sensitive     AMPICILLIN/SULBACTAM <=2 SENSITIVE Sensitive     PIP/TAZO <=4 SENSITIVE Sensitive     * RARE ESCHERICHIA COLI   Staphylococcus aureus - MIC*    CIPROFLOXACIN >=8 RESISTANT Resistant     ERYTHROMYCIN <=0.25 SENSITIVE Sensitive     GENTAMICIN <=0.5 SENSITIVE Sensitive     OXACILLIN <=0.25 SENSITIVE Sensitive     TETRACYCLINE <=1 SENSITIVE Sensitive     VANCOMYCIN <=0.5 SENSITIVE Sensitive     TRIMETH/SULFA 40 SENSITIVE Sensitive     CLINDAMYCIN <=0.25 SENSITIVE Sensitive     RIFAMPIN <=0.5 SENSITIVE Sensitive     Inducible Clindamycin NEGATIVE Sensitive     * MODERATE STAPHYLOCOCCUS AUREUS  Urine Culture     Status: None   Collection Time: 03/26/21  5:55 AM   Specimen: In/Out Cath Urine  Result Value Ref Range Status   Specimen Description   Final    IN/OUT CATH URINE Performed at [facility], [address], [place], [place] [address]  Special Requests   Final    NONE Performed at [facility], [address], [place], [place] [address]    Culture   Final    NO GROWTH Performed at [facility], [place] [address], [place], [place] [address]    Report Status 03/27/2021 FINAL  Final  Resp Panel by RT-PCR (Flu A&B, Covid) Nasopharyngeal Swab     Status: None   Collection Time: 03/26/21  5:58 AM   Specimen: Nasopharyngeal Swab; Nasopharyngeal(NP) swabs in vial transport medium  Result Value Ref Range Status   SARS Coronavirus 2 by RT PCR NEGATIVE NEGATIVE Final    Comment: (NOTE) SARS-CoV-2 target nucleic acids are NOT DETECTED.  The SARS-CoV-2 RNA is generally detectable in upper respiratory specimens during the acute phase of infection. The lowest concentration of SARS-CoV-2 viral copies this assay can detect is 138 copies/mL. A negative result does not preclude SARS-Cov-2 infection and should not be used as the sole basis for treatment or other patient management  decisions. A negative result may occur with  improper specimen collection/handling, submission of specimen other than nasopharyngeal swab, presence of viral mutation(s) within the areas targeted by this assay, and inadequate number of viral copies(<138 copies/mL). A negative result must be combined with clinical observations, patient history, and epidemiological information. The expected result is Negative.  Fact Sheet for Patients:  EntrepreneurPulse.com.au  Fact Sheet for Healthcare Providers:  IncredibleEmployment.be  This test is no t yet approved or cleared by the [place] FDA and  has been authorized for detection and/or diagnosis of SARS-CoV-2 by FDA under an Emergency Use Authorization (EUA). This EUA will remain  in effect (meaning this test can be used) for the duration of the COVID-19 declaration under Section 564(b)(1) of the Act, 21 U.S.C.section 360bbb-3(b)(1), unless the authorization is terminated  or revoked sooner.       Influenza A by PCR NEGATIVE NEGATIVE Final   Influenza B by PCR NEGATIVE NEGATIVE Final    Comment: (NOTE) The Xpert Xpress SARS-CoV-2/FLU/RSV plus assay is intended as an aid in the diagnosis of influenza from Nasopharyngeal swab specimens and should not be used as a sole basis for treatment. Nasal washings and aspirates are unacceptable for Xpert Xpress SARS-CoV-2/FLU/RSV testing.  Fact Sheet for Patients: EntrepreneurPulse.com.au  Fact Sheet for Healthcare Providers: IncredibleEmployment.be  This test is not yet approved or cleared by the [place] FDA and has been authorized for detection and/or diagnosis of SARS-CoV-2 by FDA under an Emergency Use Authorization (EUA). This EUA will remain in effect (meaning this test can be used) for the duration of the COVID-19 declaration under Section 564(b)(1) of the Act, 21 U.S.C. section 360bbb-3(b)(1), unless the  authorization is terminated or revoked.  Performed at [facility], [address] [place], [place] [address]   Blood Culture (routine x 2)     Status: None (Preliminary result)   Collection Time: 03/26/21  6:42 AM   Specimen: BLOOD  Result Value Ref Range Status   Specimen Description BLOOD RAC  Final   Special Requests   Final    BOTTLES DRAWN AEROBIC AND ANAEROBIC Blood Culture adequate volume   Culture   Final    NO GROWTH 3 DAYS Performed at [facility], [address] [place], [place] [address]    Report Status PENDING  Incomplete  Blood Culture (routine x 2)     Status: None (Preliminary result)   Collection Time: 03/26/21  6:42 AM   Specimen: BLOOD  Result Value Ref Range Status   Specimen Description BLOOD RAC  Final   Special Requests   Final    BOTTLES DRAWN AEROBIC AND ANAEROBIC Blood Culture results may not be optimal due to an excessive volume of blood received in culture bottles   Culture   Final    NO GROWTH 3 DAYS Performed at [facility], [address] [place], [place] [address]    Report Status PENDING  Incomplete     Labs: BNP (last 3 results) Recent Labs    03/26/21 1530  BNP [id number]   Basic Metabolic Panel: Recent Labs  Lab 03/25/21 1838 03/27/21 0721 03/28/21 0448 03/29/21 0450  NA 136  --  134* 135  K 3.5  --  3.7 3.2*  CL 101  --  102 104  CO2 28  --  25 26  GLUCOSE 179*  --  157* 148*  BUN 19  --  15 12  CREATININE 0.41* 0.44 0.49 0.34*  CALCIUM 9.3  --  9.1 9.2   Liver Function Tests: No results for input(s): AST, ALT, ALKPHOS, BILITOT, PROT, ALBUMIN in the last 168 hours. No results for input(s): LIPASE, AMYLASE in the last 168 hours. No results for input(s): AMMONIA in the last 168 hours. CBC: Recent Labs  Lab 03/25/21 1838 03/27/21 0721 03/28/21 0448 03/29/21 0450  WBC 7.6 9.2 11.3* 9.1  NEUTROABS 4.3  --   --   --   HGB 10.8* 11.3* 10.9* 11.0*  HCT 35.0* 36.5 35.1* 35.4*  MCV 79.9*  79.3* 78.3* 78.3*  PLT 285 332 305 352   Cardiac Enzymes: No results for input(s): CKTOTAL, CKMB, CKMBINDEX, TROPONINI in the last 168 hours. BNP: Invalid input(s): POCBNP CBG: Recent Labs  Lab 03/27/21 2045 03/28/21 0754 03/28/21 1130 03/28/21 1631 03/28/21 2102  GLUCAP 135* 155* 251* 177* 167*   D-Dimer No results for input(s): DDIMER in the last 72 hours. Hgb A1c No results for input(s): HGBA1C in the last 72 hours. Lipid Profile No results for input(s): CHOL, HDL, LDLCALC, TRIG, CHOLHDL, LDLDIRECT in the last 72 hours. Thyroid function studies No results for input(s): TSH, T4TOTAL, T3FREE, THYROIDAB in the last 72 hours.  Invalid input(s): FREET3 Anemia work up No results for input(s): VITAMINB12, FOLATE, FERRITIN, TIBC, IRON, RETICCTPCT in the last 72 hours. Urinalysis    Component Value Date/Time   COLORURINE YELLOW (A) 03/26/2021 0600   APPEARANCEUR HAZY (A) 03/26/2021 0600   LABSPEC 1.018 03/26/2021 0600   PHURINE 5.0 03/26/2021 0600   GLUCOSEU NEGATIVE 03/26/2021 0600   HGBUR NEGATIVE 03/26/2021 0600   BILIRUBINUR NEGATIVE 03/26/2021 0600   KETONESUR NEGATIVE 03/26/2021 0600   PROTEINUR 30 (A) 03/26/2021 0600   UROBILINOGEN 0.2 10/15/2006 1244   NITRITE NEGATIVE 03/26/2021 0600   LEUKOCYTESUR MODERATE (A) 03/26/2021 0600   Sepsis Labs Invalid input(s): PROCALCITONIN,  WBC,  LACTICIDVEN Microbiology Recent Results (from the past 240 hour(s))  Aerobic/Anaerobic Culture w Gram Stain (surgical/deep wound)     Status: None (Preliminary result)   Collection Time: 03/26/21  5:54 AM   Specimen: Sacral; Wound  Result Value Ref Range Status   Specimen Description   Final    SACRAL Performed at [facility], [address], [place], [place] [address]    Special Requests   Final    NONE Performed at [facility], [address] [place], [place] [address]    Gram Stain   Final    RARE WBC PRESENT,BOTH PMN AND MONONUCLEAR RARE Lonell Grandchild  POSITIVE COCCI IN PAIRS Performed at [facility], [address] [place], [place] [address]    Culture   Final    MODERATE STAPHYLOCOCCUS AUREUS RARE ESCHERICHIA COLI NO ANAEROBES ISOLATED; CULTURE IN PROGRESS FOR 5 DAYS    Report Status PENDING  Incomplete   Organism ID, Bacteria STAPHYLOCOCCUS AUREUS  Final   Organism ID, Bacteria ESCHERICHIA COLI  Final      Susceptibility   Escherichia coli - MIC*    AMPICILLIN 8 SENSITIVE Sensitive     CEFAZOLIN <=4 SENSITIVE Sensitive     CEFEPIME <=0.12 SENSITIVE Sensitive     CEFTAZIDIME <=1 SENSITIVE Sensitive     CEFTRIAXONE <=0.25 SENSITIVE Sensitive     CIPROFLOXACIN <=0.25 SENSITIVE Sensitive     GENTAMICIN <=1 SENSITIVE Sensitive     IMIPENEM <=0.25 SENSITIVE Sensitive     TRIMETH/SULFA <=20 SENSITIVE Sensitive     AMPICILLIN/SULBACTAM <=2 SENSITIVE Sensitive     PIP/TAZO <=4 SENSITIVE Sensitive     * RARE ESCHERICHIA COLI   Staphylococcus aureus - MIC*    CIPROFLOXACIN >=8 RESISTANT Resistant     ERYTHROMYCIN <=0.25 SENSITIVE Sensitive     GENTAMICIN <=0.5 SENSITIVE Sensitive     OXACILLIN <=0.25 SENSITIVE Sensitive     TETRACYCLINE <=1 SENSITIVE Sensitive     VANCOMYCIN <=0.5 SENSITIVE Sensitive     TRIMETH/SULFA 40 SENSITIVE Sensitive     CLINDAMYCIN <=0.25 SENSITIVE Sensitive     RIFAMPIN <=0.5 SENSITIVE Sensitive     Inducible Clindamycin NEGATIVE Sensitive     * MODERATE STAPHYLOCOCCUS AUREUS  Urine Culture     Status: None   Collection Time: 03/26/21  5:55 AM   Specimen: In/Out Cath Urine  Result Value Ref Range Status   Specimen Description   Final    IN/OUT CATH URINE Performed at [facility], [address], [place], [place] [address]    Special Requests   Final    NONE Performed at [facility], [address] [place], [place] [address]    Culture   Final    NO GROWTH Performed at [facility], [address] [place], [place] [address]    Report Status 03/27/2021 FINAL   Final  Resp Panel by RT-PCR (Flu A&B, Covid) Nasopharyngeal Swab     Status: None   Collection Time: 03/26/21  5:58 AM   Specimen: Nasopharyngeal Swab; Nasopharyngeal(NP) swabs in vial transport medium  Result Value Ref Range Status   SARS Coronavirus 2 by RT PCR NEGATIVE NEGATIVE Final    Comment: (NOTE) SARS-CoV-2 target nucleic acids are NOT DETECTED.  The SARS-CoV-2 RNA is generally detectable in upper respiratory specimens during the acute phase of infection. The lowest concentration of SARS-CoV-2 viral copies this assay can detect is 138 copies/mL. A negative result does not preclude SARS-Cov-2 infection and should not be used as the sole basis for treatment or other patient management decisions. A negative result may occur with  improper specimen collection/handling, submission of specimen other than nasopharyngeal swab, presence of viral mutation(s) within the areas targeted by this assay, and inadequate number of viral copies(<138 copies/mL). A negative result must be combined with clinical observations, patient history, and epidemiological information. The expected result is Negative.  Fact Sheet for Patients:  BloggerCourse.com  Fact Sheet for Healthcare Providers:  SeriousBroker.it  This test is no t yet approved or cleared by the [place] FDA and  has been authorized for detection and/or diagnosis of SARS-CoV-2 by FDA under an Emergency  Use Authorization (EUA). This EUA will remain  in effect (meaning this test can be used) for the duration of the COVID-19 declaration under Section 564(b)(1) of the Act, 21 U.S.C.section 360bbb-3(b)(1), unless the authorization is terminated  or revoked sooner.       Influenza A by PCR NEGATIVE NEGATIVE Final   Influenza B by PCR NEGATIVE NEGATIVE Final    Comment: (NOTE) The Xpert Xpress SARS-CoV-2/FLU/RSV plus assay is intended as an aid in the diagnosis of influenza from  Nasopharyngeal swab specimens and should not be used as a sole basis for treatment. Nasal washings and aspirates are unacceptable for Xpert Xpress SARS-CoV-2/FLU/RSV testing.  Fact Sheet for Patients: EntrepreneurPulse.com.au  Fact Sheet for Healthcare Providers: IncredibleEmployment.be  This test is not yet approved or cleared by the [place] FDA and has been authorized for detection and/or diagnosis of SARS-CoV-2 by FDA under an Emergency Use Authorization (EUA). This EUA will remain in effect (meaning this test can be used) for the duration of the COVID-19 declaration under Section 564(b)(1) of the Act, 21 U.S.C. section 360bbb-3(b)(1), unless the authorization is terminated or revoked.  Performed at [facility], [address] [place], [place] [address]   Blood Culture (routine x 2)     Status: None (Preliminary result)   Collection Time: 03/26/21  6:42 AM   Specimen: BLOOD  Result Value Ref Range Status   Specimen Description BLOOD RAC  Final   Special Requests   Final    BOTTLES DRAWN AEROBIC AND ANAEROBIC Blood Culture adequate volume   Culture   Final    NO GROWTH 3 DAYS Performed at [facility], [address], [place], [place] [address]    Report Status PENDING  Incomplete  Blood Culture (routine x 2)     Status: None (Preliminary result)   Collection Time: 03/26/21  6:42 AM   Specimen: BLOOD  Result Value Ref Range Status   Specimen Description BLOOD RAC  Final   Special Requests   Final    BOTTLES DRAWN AEROBIC AND ANAEROBIC Blood Culture results may not be optimal due to an excessive volume of blood received in culture bottles   Culture   Final    NO GROWTH 3 DAYS Performed at [facility], [address], [place],  [address]    Report Status PENDING  Incomplete     Time coordinating discharge: Over 30 minutes  SIGNED:   Little Ishikawa, DO [facility] 03/29/2021, 7:38 AM Pager   If 7PM-7AM, please contact night-coverage [email or web address]

## 2021-03-29 NOTE — TOC Transition Note (Signed)
Transition of Care [facility]) - CM/SW Discharge Note   Patient Details  Name: Molly [name] MRN: [id number] Date of Birth: September 09, 1945  Transition of Care [facility]) CM/SW Contact:  Bing Quarry, RN Phone Number: 03/29/2021, 9:35 AM   Clinical Narrative: 12/31: Patient to be discharged home today. [facility] confirmed via Kandee Keen for [facility] services. Needs RN [facility] order as well as PT. Requested orders from provider for [facility] and to add RN for sacral wound care. Patient needs transportation from [facility] to home per provider. Unit RN confirmed need for [facility] transport due to LEFT sided weakness from stroke. Transportation arranged via [facility] with spouse contact information given to receive at home. Unit RN notified and forms printed to unit for EMS transportation. Gabriel Cirri RN CM [phone]    Final next level of care: Home w Home Health Services Barriers to Discharge: Barriers Resolved   Patient Goals and CMS Choice        Discharge Placement                       Discharge Plan and Services   Discharge Planning Services: CM Consult                      HH Arranged: RN, PT [facility] Agency: [facility] Date [facility] Agency Contacted:  (Confirned 12/31 with Kandee Keen at [place] and requested orders be placed.) Time [facility] Agency Contacted: [phone] Representative spoke with at [facility] Agency: Kandee Keen  Social Determinants of Health (SDOH) Interventions     Readmission Risk Interventions Readmission Risk Prevention Plan 12/13/2019  Transportation Screening Complete  Palliative Care Screening Not Applicable  Medication Review (RN Care Manager) Complete  Some recent data might be hidden

## 2021-03-31 LAB — AEROBIC/ANAEROBIC CULTURE W GRAM STAIN (SURGICAL/DEEP WOUND)

## 2021-04-01 LAB — CULTURE, BLOOD (ROUTINE X 2)
Culture: NO GROWTH
Culture: NO GROWTH
Special Requests: ADEQUATE

## 2021-04-06 ENCOUNTER — Emergency Department
Admission: EM | Admit: 2021-04-06 | Discharge: 2021-04-06 | Disposition: A | Discharge status: Home / Self Care | Payer: Medicare HMO | Attending: Student in an Organized Health Care Education/Training Program | Admitting: Student in an Organized Health Care Education/Training Program

## 2021-04-06 ENCOUNTER — Encounter: Payer: Self-pay | Admitting: Emergency Medicine

## 2021-04-06 ENCOUNTER — Other Ambulatory Visit: Payer: Self-pay

## 2021-04-06 DIAGNOSIS — N760 Acute vaginitis: Secondary | ICD-10-CM

## 2021-04-06 DIAGNOSIS — R102 Pelvic and perineal pain: Secondary | ICD-10-CM | POA: Diagnosis present

## 2021-04-06 LAB — CBC WITH DIFFERENTIAL/PLATELET
Abs Immature Granulocytes: 0.02 10*3/uL (ref 0.00–0.07)
Basophils Absolute: 0 10*3/uL (ref 0.0–0.1)
Basophils Relative: 0 %
Eosinophils Absolute: 0 10*3/uL (ref 0.0–0.5)
Eosinophils Relative: 0 %
HCT: 34.2 % — ABNORMAL LOW (ref 36.0–46.0)
Hemoglobin: 10.8 g/dL — ABNORMAL LOW (ref 12.0–15.0)
Immature Granulocytes: 0 %
Lymphocytes Relative: 24 %
Lymphs Abs: 2.4 10*3/uL (ref 0.7–4.0)
MCH: 25.2 pg — ABNORMAL LOW (ref 26.0–34.0)
MCHC: 31.6 g/dL (ref 30.0–36.0)
MCV: 79.7 fL — ABNORMAL LOW (ref 80.0–100.0)
Monocytes Absolute: 0.8 10*3/uL (ref 0.1–1.0)
Monocytes Relative: 8 %
Neutro Abs: 6.5 10*3/uL (ref 1.7–7.7)
Neutrophils Relative %: 68 %
Platelets: 276 10*3/uL (ref 150–400)
RBC: 4.29 MIL/uL (ref 3.87–5.11)
RDW: 15.9 % — ABNORMAL HIGH (ref 11.5–15.5)
WBC: 9.7 10*3/uL (ref 4.0–10.5)
nRBC: 0 % (ref 0.0–0.2)

## 2021-04-06 LAB — COMPREHENSIVE METABOLIC PANEL
ALT: 12 U/L (ref 0–44)
AST: 15 U/L (ref 15–41)
Albumin: 2.6 g/dL — ABNORMAL LOW (ref 3.5–5.0)
Alkaline Phosphatase: 52 U/L (ref 38–126)
Anion gap: 7 (ref 5–15)
BUN: 22 mg/dL (ref 8–23)
CO2: 28 mmol/L (ref 22–32)
Calcium: 8.8 mg/dL — ABNORMAL LOW (ref 8.9–10.3)
Chloride: 99 mmol/L (ref 98–111)
Creatinine, Ser: 0.5 mg/dL (ref 0.44–1.00)
GFR, Estimated: >60 mL/min (ref >60)
Glucose, Bld: 136 mg/dL — ABNORMAL HIGH (ref 70–99)
Potassium: 3.1 mmol/L — ABNORMAL LOW (ref 3.5–5.1)
Sodium: 134 mmol/L — ABNORMAL LOW (ref 135–145)
Total Bilirubin: 0.7 mg/dL (ref 0.3–1.2)
Total Protein: 6.5 g/dL (ref 6.5–8.1)

## 2021-04-06 LAB — LIPASE, BLOOD: Lipase: 26 U/L (ref 11–51)

## 2021-04-06 MED ORDER — FLUCONAZOLE 50 MG PO TABS
150.0000 mg | ORAL_TABLET | Freq: Once | ORAL | Status: AC
  Administered 2021-04-06: 150 mg via ORAL
  Filled 2021-04-06: qty 1

## 2021-04-06 MED ORDER — FLUCONAZOLE 150 MG PO TABS: 150.0000 mg | ORAL_TABLET | Freq: Every day | ORAL | 0 refills | Status: AC

## 2021-04-06 NOTE — ED Triage Notes (Signed)
Pt in via EMS from home with c/o Pt husband was giving her a bath and noticed a thing hanging out of her vagina. Pt also with abscess/Ulcer above her buttocks and is on abx for it. Pt with left sided weakness from previous stroke

## 2021-04-06 NOTE — ED Notes (Signed)
Called Husband about pt discharge.

## 2021-04-06 NOTE — Discharge Instructions (Addendum)
You h ave been given your first dose of diflucan this evening in the ER.  I have sent a second dose to [facility] on [address].  This second dose can be taken on 04/09/2021.  Please follow up with PCP

## 2021-04-06 NOTE — ED Notes (Signed)
[facility] to transport to home ?

## 2021-04-06 NOTE — ED Provider Notes (Signed)
[facility] Provider Note    Event Date/Time   First MD Initiated Contact with Patient 04/06/21 2047     (approximate)   History   Vaginal Pain   HPI  Molly [name] is a 76 y.o. female extensive past medical history of CVA chronically bedbound currently on antibiotics for history of sacral ulcer presents to the ER for vaginal swelling and discomfort.  Has been noted some redness and swelling last night when he was cleaning her room.  States that she has had yeast infections in the past.  Does endorse of burning vaginal discomfort.     Physical Exam   Triage Vital Signs: ED Triage Vitals  Enc Vitals Group     BP 04/06/21 1849 (!) 99/56     Pulse Rate 04/06/21 1849 85     Resp 04/06/21 1849 20     Temp 04/06/21 1849 97.8 F (36.6 C)     Temp Source 04/06/21 1849 Oral     SpO2 04/06/21 1852 93 %     Weight 04/06/21 1850 125 lb (56.7 kg)     Height 04/06/21 1850 5\' 7"  (1.702 m)     Head Circumference --      Peak Flow --      Pain Score 04/06/21 1850 4     Pain Loc --      Pain Edu? --      Excl. in GC? --     Most recent vital signs: Vitals:   04/06/21 1849 04/06/21 1852  BP: (!) 99/56   Pulse: 85   Resp: 20   Temp: 97.8 F (36.6 C)   SpO2:  93%     Constitutional: Alert  Eyes: Conjunctivae are normal.  Head: Atraumatic. Nose: No congestion/rhinnorhea. Mouth/Throat: Mucous membranes are moist.   Neck: Painless ROM.  Cardiovascular:   Good peripheral circulation. Respiratory: Normal respiratory effort.  No retractions.  Gastrointestinal: Soft and nontender.  GU exam has evidence of edematous erythematous vulva and labia no fluctuance.  Does have a candidal appearing discharge. Musculoskeletal:  no deformity Neurologic:  MAE spontaneously. No gross focal neurologic deficits are appreciated.  Skin:  Skin is warm, dry and intact. No rash noted. Psychiatric: Mood and affect are normal. Speech and behavior are normal.    ED  Results / Procedures / Treatments   Labs (all labs ordered are listed, but only abnormal results are displayed) Labs Reviewed  CBC WITH DIFFERENTIAL/PLATELET - Abnormal; Notable for the following components:      Result Value   Hemoglobin 10.8 (*)    HCT 34.2 (*)    MCV 79.7 (*)    MCH 25.2 (*)    RDW 15.9 (*)    All other components within normal limits  COMPREHENSIVE METABOLIC PANEL - Abnormal; Notable for the following components:   Sodium 134 (*)    Potassium 3.1 (*)    Glucose, Bld 136 (*)    Calcium 8.8 (*)    Albumin 2.6 (*)    All other components within normal limits  LIPASE, BLOOD  URINALYSIS, COMPLETE (UACMP) WITH MICROSCOPIC     EKG     RADIOLOGY    PROCEDURES:  Critical Care performed:   Procedures   MEDICATIONS ORDERED IN ED: Medications  fluconazole (DIFLUCAN) tablet 150 mg (has no administration in time range)     IMPRESSION / MDM / ASSESSMENT AND PLAN / ED COURSE  I reviewed the triage vital signs and the nursing notes.  Differential diagnosis includes, but is not limited to, vaginitis, Bartholin cyst, abscess, Fournier's, cellulitis, prolapse  Patient presenting to the ER for evaluation of symptoms as described above.  Patient with extensive and complex past medical history presenting with the above complaints.  On exam does have evidence clinically of vulvovaginitis no leukocytosis otherwise well perfused not consistent with Fournier's, not consistent with abscess.  Patient will be placed on Diflucan.  Discussed conservative management and signs and symptoms for which patient should return to the ER.  Patient and family agree to plan   Clinical Course as of 04/06/21 2150  Wynelle Link Apr 06, 2021  2059 Lipase, blood [AE]  2100 Urinalysis, Complete w Microscopic Urine, Clean Catch [AE]  2100 Comprehensive metabolic panel(!) [AE]    Clinical Course User Index [AE] Gladys Damme, Student-PA     FINAL CLINICAL  IMPRESSION(S) / ED DIAGNOSES   Final diagnoses:  Vulvovaginitis     Rx / DC Orders   ED Discharge Orders          Ordered    fluconazole (DIFLUCAN) 150 MG tablet  Daily        04/06/21 2144             Note:  This document was prepared using Dragon voice recognition software and may include unintentional dictation errors.    Willy Eddy, MD 04/06/21 2150

## 2021-04-06 NOTE — ED Notes (Signed)
Pt cleaned into new brief and chucks applied

## 2021-04-06 NOTE — ED Provider Notes (Signed)
Emergency Medicine Provider Triage Evaluation Note  Molly [name] , a 76 y.o. female  was evaluated in triage.  Pt complains of possible prolapsed uterus.  Patient has mineralize meeting her today and noticed new prolapse.  Patient has a history of prior CVA and provides limited historical details.  Review of Systems  Positive: Patient has vaginal pain.  Negative: No chest pain, chest tightness or abdominal pain.   Physical Exam  There were no vitals taken for this visit. Gen:   Awake, no distress   Resp:  Normal effort  MSK:   Moves extremities without difficulty    Medical Decision Making  Medically screening exam initiated at 6:48 PM.  Appropriate orders placed.  Kendell Bane was informed that the remainder of the evaluation will be completed by another provider, this initial triage assessment does not replace that evaluation, and the importance of remaining in the ED until their evaluation is complete.     Vallarie Mare [place], PA-C 04/06/21 1849    Harvest Dark, MD 04/07/21 [phone]

## 2021-04-06 NOTE — ED Notes (Signed)
Pt transferred from recliner to bed. Pt cleaned up from [facility] & new chucks pad placed under pt.

## 2021-04-06 NOTE — ED Triage Notes (Signed)
Pt via EMS from home. See First Nurse note, possible uterine prolapse. Pt does not know why she is here, pt states her butt hurts. Pt is A&OX4  and NAD

## 2021-04-10 ENCOUNTER — Ambulatory Visit: Payer: Medicare HMO | Admitting: Physician Assistant

## 2021-04-21 ENCOUNTER — Encounter: Payer: Medicare HMO | Attending: Physician Assistant | Admitting: Physician Assistant

## 2021-04-21 ENCOUNTER — Other Ambulatory Visit: Payer: Self-pay

## 2021-04-21 DIAGNOSIS — M1990 Unspecified osteoarthritis, unspecified site: Secondary | ICD-10-CM | POA: Diagnosis not present

## 2021-04-21 DIAGNOSIS — L89153 Pressure ulcer of sacral region, stage 3: Secondary | ICD-10-CM | POA: Diagnosis present

## 2021-04-21 DIAGNOSIS — F01A0 Vascular dementia, mild, without behavioral disturbance, psychotic disturbance, mood disturbance, and anxiety: Secondary | ICD-10-CM | POA: Insufficient documentation

## 2021-04-21 DIAGNOSIS — M6281 Muscle weakness (generalized): Secondary | ICD-10-CM | POA: Diagnosis not present

## 2021-04-21 DIAGNOSIS — E11622 Type 2 diabetes mellitus with other skin ulcer: Secondary | ICD-10-CM | POA: Diagnosis not present

## 2021-04-21 DIAGNOSIS — E1140 Type 2 diabetes mellitus with diabetic neuropathy, unspecified: Secondary | ICD-10-CM | POA: Diagnosis not present

## 2021-04-21 DIAGNOSIS — Z87891 Personal history of nicotine dependence: Secondary | ICD-10-CM | POA: Diagnosis not present

## 2021-04-21 DIAGNOSIS — I10 Essential (primary) hypertension: Secondary | ICD-10-CM | POA: Diagnosis not present

## 2021-04-21 NOTE — Progress Notes (Addendum)
Molly, ELLASON [name] ([id number]) Visit Report for 04/21/2021 Chief Complaint Document Details Patient Name: Molly [name], [name] Date of Service: 04/21/2021 12:45 PM Medical Record Number: [id number] Patient Account Number: [id number] Date of Birth/Sex: May 21, 1945 (76 y.o. F) Treating RN: Levora Dredge Primary Care Provider: Josephine Cables Other Clinician: Referring Provider: Josephine Cables Treating Provider/Extender: Skipper Cliche in Treatment: 0 Information Obtained from: Patient Chief Complaint Sacral Pressure Ulcer Stage 3 Electronic Signature(s) Signed: 04/21/2021 1:52:18 PM By: Worthy Keeler PA-C Entered By: Worthy Keeler on 04/21/2021 13:52:17 Dass, Kielee P. ([id number]) -------------------------------------------------------------------------------- HPI Details Patient Name: Molly, [name] Date of Service: 04/21/2021 12:45 PM Medical Record Number: [id number] Patient Account Number: [id number] Date of Birth/Sex: [birth year] (76 y.o. F) Treating RN: Levora Dredge Primary Care Provider: Josephine Cables Other Clinician: Referring Provider: Josephine Cables Treating Provider/Extender: Skipper Cliche in Treatment: 0 History of Present Illness HPI Description: Readmission: 04/21/2021 upon evaluation today patient presents for reevaluation here in the clinic those been since 2016 since she was last here. I have cared for her husband more recently. With that being said she tells me currently that she is having a lot of discomfort with her wound. She does have some dementia issues. At one point she actually told the nurse that she goes horseback riding regularly. Nonetheless she is generally weak and not really able to even sit or stand or walk so that obviously is not the case. Either way the biggest issue here is that she has a sacral wound. She has a gel overlay for her hospital bed she generally lays on her side so I think that she is not really getting pressure  too much there. The only time she is really in her chair is when she is coming for doctor's appointment. With all that being said I do believe that the patient is experiencing some issues here with increased pain from this wound but I do not see any signs of active infection at this time. She has a history of diabetes mellitus type 2 although this seems to be under pretty good control he checks her blood sugar that is her husband occasionally gives insulin as needed but again he tells me he has not had to for some time. She also stated to be hypertensive though her blood pressure was good today and it does not appear that she is on any specific medications currently. A wound VAC has not been attempted at this point. Electronic Signature(s) Signed: 04/21/2021 2:09:59 PM By: Worthy Keeler PA-C Entered By: Worthy Keeler on 04/21/2021 14:09:59 Hanks, Prince P. ([id number]) -------------------------------------------------------------------------------- Physical Exam Details Patient Name: Molly, [name] Date of Service: 04/21/2021 12:45 PM Medical Record Number: [id number] Patient Account Number: [id number] Date of Birth/Sex: 24-Dec-1945 (76 y.o. F) Treating RN: Levora Dredge Primary Care Provider: Josephine Cables Other Clinician: Referring Provider: Josephine Cables Treating Provider/Extender: Jeri Cos Weeks in Treatment: 0 Constitutional sitting or standing blood pressure is within target range for patient.. pulse regular and within target range for patient.Marland Kitchen respirations regular, non- labored and within target range for patient.Marland Kitchen temperature within target range for patient.. Well-nourished and well-hydrated in no acute distress. Eyes conjunctiva clear no eyelid edema noted. pupils equal round and reactive to light and accommodation. Ears, Nose, Mouth, and Throat no gross abnormality of ear auricles or external auditory canals. normal hearing noted during conversation. mucus  membranes moist. Respiratory normal breathing without difficulty. Musculoskeletal Patient unable to walk. Psychiatric Patient is not able to cooperate in decision making regarding  care. Patient is oriented to person only. pleasant and cooperative. Notes Upon inspection patient's wound actually appears to be fairly clean with good granulation base of the wound in the sacral area. I contemplated a wound VAC but to be honest held off on ordering that currently I first want a try just doing some collagen and see how this will do for her. The patient and her husband voiced understanding. He obviously does not want do the wound VAC if we do not have to but nonetheless it becomes absolutely necessary then we will do what we need to do. Electronic Signature(s) Signed: 04/21/2021 2:14:39 PM By: Worthy Keeler PA-C Entered By: Worthy Keeler on 04/21/2021 14:14:39 Housman, Georgina Mamie Nick ([id number]) -------------------------------------------------------------------------------- Physician Orders Details Patient Name: [name], [name] Date of Service: 04/21/2021 12:45 PM Medical Record Number: [id number] Patient Account Number: [id number] Date of Birth/Sex: July 23, 1945 (76 y.o. F) Treating RN: Levora Dredge Primary Care Provider: Josephine Cables Other Clinician: Referring Provider: Josephine Cables Treating Provider/Extender: Skipper Cliche in Treatment: 0 Verbal / Phone Orders: No Diagnosis Coding ICD-10 Coding Code Description L89.153 Pressure ulcer of sacral region, stage 3 E11.622 Type 2 diabetes mellitus with other skin ulcer M62.81 Muscle weakness (generalized) I10 Essential (primary) hypertension F01.A0 Vascular dementia, mild, without behavioral disturbance, psychotic disturbance, mood disturbance, and anxiety Follow-up Appointments o Return Appointment in 2 weeks. o Nurse Visit as needed [place] for wound care. May  utilize formulary equivalent dressing for wound treatment orders unless otherwise specified. Home Health Nurse may visit [facility] to address patientos wound care needs. o Scheduled days for dressing changes to be completed; exception, patient has scheduled wound care visit that day. o **Please direct any NON-WOUND related issues/requests for orders to patient's Primary Care Physician. **If current dressing causes regression in wound condition, may D/C ordered dressing product/s and apply Normal Saline Moist Dressing daily until next [place] or Other MD appointment. **Notify [facility] of regression in wound condition at [phone]. Bathing/ Shower/ Hygiene o Wash wounds with antibacterial soap and water. o May shower; gently cleanse wound with antibacterial soap, rinse and pat dry prior to dressing wounds o No tub bath. Off-Loading o Turn and reposition every 2 hours Wound Treatment Wound #3 - Sacrum Wound Laterality: Midline Primary Dressing: Prisma 4.34 (in) 3 x Per Week/30 Days Discharge Instructions: pack wound with dry prisma using a q tip Secondary Dressing: Gauze 3 x Per Week/30 Days Discharge Instructions: As directed:, moistened with saline Secured With: zetuvit silicone boarder 3 x Per Week/30 Days Discharge Instructions: placed over wound Electronic Signature(s) Signed: 04/22/2021 4:34:33 PM By: Levora Dredge Signed: 04/22/2021 6:21:10 PM By: Worthy Keeler PA-C Previous Signature: 04/21/2021 4:34:41 PM Version By: Worthy Keeler PA-C Previous Signature: 04/21/2021 4:44:26 PM Version By: Levora Dredge Entered By: Levora Dredge on 04/22/2021 16:30:04 Schauer, Jarielys P. ([id number]) -------------------------------------------------------------------------------- Problem List Details Patient Name: Ury, Lillee P. Date of Service: 04/21/2021 12:45 PM Medical Record Number: [id number] Patient Account Number: [id number] Date of Birth/Sex:  January 22, 1946 (76 y.o. F) Treating RN: Levora Dredge Primary Care Provider: Josephine Cables Other Clinician: Referring Provider: Josephine Cables Treating Provider/Extender: Skipper Cliche in Treatment: 0 Active Problems ICD-10 Encounter Code Description Active Date MDM Diagnosis L89.153 Pressure ulcer of sacral region, stage 3 04/21/2021 No Yes E11.622 Type 2 diabetes mellitus with other skin ulcer 04/21/2021 No Yes M62.81 Muscle weakness (generalized) 04/21/2021 No Yes I10 Essential (primary) hypertension 04/21/2021 No Yes F01.A0  Vascular dementia, mild, without behavioral disturbance, psychotic 04/21/2021 No Yes disturbance, mood disturbance, and anxiety Inactive Problems Resolved Problems Electronic Signature(s) Signed: 04/21/2021 1:51:48 PM By: Worthy Keeler PA-C Entered By: Worthy Keeler on 04/21/2021 13:51:48 Nepomuceno, Jonessa P. ([id number]) -------------------------------------------------------------------------------- Progress Note Details Patient Name: Rehfeld, Minyon P. Date of Service: 04/21/2021 12:45 PM Medical Record Number: [id number] Patient Account Number: [id number] Date of Birth/Sex: [birth year] (76 y.o. F) Treating RN: Levora Dredge Primary Care Provider: Josephine Cables Other Clinician: Referring Provider: Josephine Cables Treating Provider/Extender: Skipper Cliche in Treatment: 0 Subjective Chief Complaint Information obtained from Patient Sacral Pressure Ulcer Stage 3 History of Present Illness (HPI) Readmission: 04/21/2021 upon evaluation today patient presents for reevaluation here in the clinic those been since 2016 since she was last here. I have cared for her husband more recently. With that being said she tells me currently that she is having a lot of discomfort with her wound. She does have some dementia issues. At one point she actually told the nurse that she goes horseback riding regularly. Nonetheless she is generally weak and not  really able to even sit or stand or walk so that obviously is not the case. Either way the biggest issue here is that she has a sacral wound. She has a gel overlay for her hospital bed she generally lays on her side so I think that she is not really getting pressure too much there. The only time she is really in her chair is when she is coming for doctor's appointment. With all that being said I do believe that the patient is experiencing some issues here with increased pain from this wound but I do not see any signs of active infection at this time. She has a history of diabetes mellitus type 2 although this seems to be under pretty good control he checks her blood sugar that is her husband occasionally gives insulin as needed but again he tells me he has not had to for some time. She also stated to be hypertensive though her blood pressure was good today and it does not appear that she is on any specific medications currently. A wound VAC has not been attempted at this point. Patient History Information obtained from Patient. Allergies No Known Drug Allergies Family History Cancer - Maternal Grandparents,Father, Stroke - Father, No family history of Diabetes, Heart Disease, Hypertension, Kidney Disease, Lung Disease, Seizures, Thyroid Problems. Social History Former smoker - ended on 11/18/1968, Marital Status - Married, Alcohol Use - Rarely, Drug Use - No History, Caffeine Use - Daily - coffee. Medical History Eyes Patient has history of Cataracts - removed 2011 Hematologic/Lymphatic Patient has history of Anemia Respiratory Patient has history of Asthma Cardiovascular Patient has history of Hypertension Endocrine Patient has history of Type II Diabetes Genitourinary Denies history of End Stage Renal Disease Integumentary (Skin) Denies history of History of Burn, History of pressure wounds Musculoskeletal Patient has history of Osteoarthritis Denies history of Gout, Rheumatoid  Arthritis, Osteomyelitis Neurologic Patient has history of Neuropathy Denies history of Seizure Disorder Oncologic Denies history of Received Chemotherapy, Received Radiation Patient is treated with Insulin, Oral Agents. Blood sugar is tested. Medical And Surgical History Notes Constitutional Symptoms (General Health) DM II, broken foot, Stroke 2008; HTN; Neuropathy-hands and feet Cardiovascular Shaddix, Sharlee P. 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 Gastrointestinal Denies complaints or symptoms of Frequent diarrhea, Nausea, Vomiting, constipation Genitourinary Denies complaints or symptoms of Kidney failure/ Dialysis, Incontinence/dribbling. Immunological Denies complaints or symptoms of Hives, Itching. Integumentary (Skin) Complains or has symptoms of Wounds - sacrum since sep 2022. Musculoskeletal Complains or has symptoms of Muscle Weakness, 1 year since being able to put any weight on bilat LE Psychiatric Complains or has symptoms of Anxiety. Objective Constitutional sitting or standing blood pressure is within target range for patient.. pulse regular and within target range for patient.Marland Kitchen respirations regular, non- labored and within target range for patient.Marland Kitchen temperature within target range for patient.. Well-nourished and well-hydrated in no acute distress. Vitals Time Taken: 1:15 PM, Height: 66 in, Source: Stated, Weight: 163 lbs, Source: Stated, BMI: 26.3, Temperature: 97.5 F, Pulse: 96 bpm, Respiratory Rate: 18 breaths/min, Blood Pressure: 115/71 mmHg. General Notes: patient unable to bear weight to bilat LE Eyes conjunctiva clear no eyelid edema noted. pupils equal round and reactive to light and accommodation. Ears, Nose, Mouth, and  Throat no gross abnormality of ear auricles or external auditory canals. normal hearing noted during conversation. mucus membranes moist. Respiratory normal breathing without difficulty. Musculoskeletal Patient unable to walk. Psychiatric Patient is not able to cooperate in decision making regarding care. Patient is oriented to person only. pleasant and cooperative. General Notes: Upon inspection patient's wound actually appears to be fairly clean with good granulation base of the wound in the sacral area. I contemplated a wound VAC but to be honest held off on ordering that currently I first want a try just doing some collagen and see how this will do for her. The patient and her husband voiced understanding. He obviously does not want do the wound VAC if we do not have to but nonetheless it becomes absolutely necessary then we will do what we need to do. Integumentary (Hair, Skin) Wound #3 status is Open. Original cause of wound was Pressure Injury. The date acquired was: 11/28/2020. The wound is located on the Midline Sacrum. The wound measures 1cm length x 0.5cm width x 0.3cm depth; 0.393cm^2 area and 0.118cm^3 volume. There is Fat Layer (Subcutaneous Tissue) exposed. There is no tunneling noted, however, there is undermining starting at 9:00 and ending at 3:00 with a maximum distance of 2.5cm. There is a medium amount of serous drainage noted. The wound margin is distinct with the outline attached to the wound base. There is large (67-100%) red granulation within the wound bed. There is a small (1-33%) amount of necrotic tissue within the wound bed including Adherent Slough. Assessment Active Problems Karel, Klani P. ([id number]) ICD-10 Pressure ulcer of sacral region, stage 3 Type 2 diabetes mellitus with other skin ulcer Muscle weakness (generalized) Essential (primary) hypertension Vascular dementia, mild, without behavioral disturbance, psychotic disturbance, mood disturbance, and  anxiety Plan 1. Would recommend currently that we go ahead and initiate treatment with silver collagen packed into the underlying area of the wound I think that this should do a pretty good job for her and hopefully will help stimulate some additional tissue growth. 2. I am also can recommend that we use saline moistened 2 x 2 gauze to tuck and behind not too tightly but just to hold everything in place and siphon out any drainage. 3. I am also can recommend that we have the patient continue with offloading I think laying in the bed on her side is definitely a good way to go and that can help this to heal as well.  4. I would also suggest that we continue to monitor for any signs of infection right now I see nothing that appears to be infected but obviously if things change then we will make any adjustments as we need to. We will see patient back for reevaluation in 1 week here in the clinic. If anything worsens or changes patient will contact our office for additional recommendations. Electronic Signature(s) Signed: 04/21/2021 2:15:30 PM By: Worthy Keeler PA-C Entered By: Worthy Keeler on 04/21/2021 14:15:30 Gadsden, Silvina Mamie Nick ([id number]) -------------------------------------------------------------------------------- ROS/PFSH Details Patient Name: Warsame, Scarlette P. Date of Service: 04/21/2021 12:45 PM Medical Record Number: [id number] Patient Account Number: [id number] Date of Birth/Sex: 1945/11/23 (76 y.o. F) Treating RN: Levora Dredge Primary Care Provider: Josephine Cables Other Clinician: Referring Provider: Josephine Cables Treating Provider/Extender: Skipper Cliche in Treatment: 0 Information Obtained From Patient Eyes Complaints and Symptoms: Positive for: Glasses / Contacts Review of System Notes: field cut Medical History: Positive for: Cataracts - removed 2011 Ear/Nose/Mouth/Throat Complaints and Symptoms: Negative for: Difficult clearing ears;  Sinusitis Gastrointestinal Complaints and Symptoms: Negative for: Frequent diarrhea; Nausea; Vomiting Review of System Notes: constipation Genitourinary Complaints and Symptoms: Negative for: Kidney failure/ Dialysis; Incontinence/dribbling Medical History: Negative for: End Stage Renal Disease Immunological Complaints and Symptoms: Negative for: Hives; Itching Integumentary (Skin) Complaints and Symptoms: Positive for: Wounds - sacrum since sep 2022 Medical History: Negative for: History of Burn; History of pressure wounds Musculoskeletal Complaints and Symptoms: Positive for: Muscle Weakness Review of System Notes: 1 year since being able to put any weight on bilat LE Medical History: Positive for: Osteoarthritis Negative for: Gout; Rheumatoid Arthritis; Osteomyelitis Psychiatric Lightner, Valoria P. ([id number]) Complaints and Symptoms: Positive for: Anxiety Constitutional Symptoms (General Health) Medical History: Past Medical History Notes: DM II, broken foot, Stroke 2008; HTN; Neuropathy-hands and feet Hematologic/Lymphatic Medical History: Positive for: Anemia Respiratory Medical History: Positive for: Asthma Cardiovascular Medical History: Positive for: Hypertension Past Medical History Notes: CVA (ischemic- 2008) Endocrine Medical History: Positive for: Type II Diabetes Time with diabetes: 17 years Treated with: Insulin, Oral agents Blood sugar tested every day: Yes Tested : twice daily Neurologic Medical History: Positive for: Neuropathy Negative for: Seizure Disorder Past Medical History Notes: limited movement on (L) side due to stroke Oncologic Medical History: Negative for: Received Chemotherapy; Received Radiation HBO Extended History Items Eyes: Cataracts Immunizations Pneumococcal Vaccine: Received Pneumococcal Vaccination: Yes Received Pneumococcal Vaccination On or After 60th Birthday: Yes Tetanus Vaccine: Last tetanus shot:  04/30/2013 Implantable Devices None Family and Social History Cancer: Yes - Maternal Grandparents,Father; Diabetes: No; Heart Disease: No; Hypertension: No; Kidney Disease: No; Lung Disease: No; Seizures: No; Stroke: Yes - Father; Thyroid Problems: No; Former smoker - ended on 11/18/1968; Marital Status - Married; Alcohol Use: Rarely; Drug Use: No History; Caffeine Use: Daily - coffee; Financial Concerns: No; Food, Clothing or Shelter Needs: No; Support System Lacking: No; Transportation Concerns: No Staggs, Solyana P. ([id number]) Electronic Signature(s) Signed: 04/21/2021 4:34:41 PM By: Worthy Keeler PA-C Signed: 04/21/2021 4:44:26 PM By: Levora Dredge Entered By: Levora Dredge on 04/21/2021 13:23:03 Andreas, Louella P. ([id number]) -------------------------------------------------------------------------------- SuperBill Details Patient Name: Prestia, Aquila P. Date of Service: 04/21/2021 Medical Record Number: [id number] Patient Account Number: [id number] Date of Birth/Sex: December 20, 1945 (76 y.o. F) Treating RN: Levora Dredge Primary Care Provider: Josephine Cables Other Clinician: Referring Provider: Josephine Cables Treating Provider/Extender: Jeri Cos Weeks in Treatment: 0 Diagnosis Coding ICD-10 Codes Code Description [phone] Pressure ulcer of sacral region, stage 3 E11.622 Type 2 diabetes mellitus with other  skin ulcer M62.81 Muscle weakness (generalized) I10 Essential (primary) hypertension F01.A0 Vascular dementia, mild, without behavioral disturbance, psychotic disturbance, mood disturbance, and anxiety Facility Procedures CPT4 Code: [id number] Description: [phone] - WOUND CARE VISIT-LEV 2 NEW PT Modifier: Quantity: 1 Physician Procedures CPT4 Code: [id number] Description: [id number] - WC PHYS LEVEL 4 - NEW PT Modifier: Quantity: 1 CPT4 Code: Description: ICD-10 Diagnosis Description L89.153 Pressure ulcer of sacral region, stage 3 E11.622 Type 2 diabetes mellitus with other  skin ulcer M62.81 Muscle weakness (generalized) I10 Essential (primary) hypertension Modifier: Quantity: Electronic Signature(s) Signed: 04/21/2021 4:35:09 PM By: Levora Dredge Signed: 04/22/2021 10:02:25 AM By: Worthy Keeler PA-C Previous Signature: 04/21/2021 2:15:47 PM Version By: Worthy Keeler PA-C Entered By: Levora Dredge on 04/21/2021 16:35:08

## 2021-04-21 NOTE — Progress Notes (Signed)
Molly [name], Molly [name] ([id number]) Visit Report for 04/21/2021 Abuse Risk Screen Details Patient Name: Molly [name], [name] Date of Service: 04/21/2021 12:45 PM Medical Record Number: [id number] Patient Account Number: [id number] Date of Birth/Sex: 06/08/1945 (76 y.o. F) Treating RN: Levora Dredge Primary Care Andranik Jeune: Josephine Cables Other Clinician: Referring Arvil Utz: Josephine Cables Treating Maksim Peregoy/Extender: Skipper Cliche in Treatment: 0 Abuse Risk Screen Items Answer ABUSE RISK SCREEN: Has anyone close to you tried to hurt or harm you recentlyo No Do you feel uncomfortable with anyone in your familyo No Has anyone forced you do things that you didnot want to doo No Electronic Signature(s) Signed: 04/21/2021 4:44:26 PM By: Levora Dredge Entered By: Levora Dredge on 04/21/2021 13:23:25 Molly [name], [name] ([id number]) -------------------------------------------------------------------------------- Activities of Daily Living Details Patient Name: Molly [name], [name] Date of Service: 04/21/2021 12:45 PM Medical Record Number: [id number] Patient Account Number: [id number] Date of Birth/Sex: 03-19-1946 (76 y.o. F) Treating RN: Levora Dredge Primary Care Lizmary Nader: Josephine Cables Other Clinician: Referring Timira Bieda: Josephine Cables Treating Oberia Beaudoin/Extender: Skipper Cliche in Treatment: 0 Activities of Daily Living Items Answer Activities of Daily Living (Please select one for each item) Drive Automobile Not Able Take Medications Need Assistance Use Telephone Need Assistance Care for Appearance Need Assistance Use Toilet Need Assistance Bath / Shower Need Assistance Dress Self Need Assistance Feed Self Need Assistance Walk Need Assistance Get In / Out Bed Need Assistance Housework Need Assistance Prepare Meals Need Assistance Handle Money Need Assistance Shop for Self Need Assistance Electronic Signature(s) Signed: 04/21/2021 4:44:26 PM By: Levora Dredge Entered By: Levora Dredge on 04/21/2021 13:24:01 Molly [name], [name] ([id number]) -------------------------------------------------------------------------------- Education Screening Details Patient Name: Molly [name], [name] Date of Service: 04/21/2021 12:45 PM Medical Record Number: [id number] Patient Account Number: [id number] Date of Birth/Sex: 10/12/1945 (76 y.o. F) Treating RN: Levora Dredge Primary Care Jazmin Vensel: Josephine Cables Other Clinician: Referring Amontae Ng: Josephine Cables Treating Dorien Bessent/Extender: Skipper Cliche in Treatment: 0 Learning Preferences/Education Level/Primary Language Learning Preference: Explanation, Demonstration, Video, Communication Board, Printed Material Highest Education Level: College or Above Preferred Language: English Cognitive Barrier Language Barrier: No Translator Needed: No Memory Deficit: No Emotional Barrier: No Cultural/Religious Beliefs Affecting Medical Care: No Physical Barrier Impaired Vision: Yes Impaired Hearing: No Decreased Hand dexterity: No Knowledge/Comprehension Knowledge Level: High Comprehension Level: High Ability to understand written instructions: High Ability to understand verbal instructions: High Motivation Anxiety Level: Calm Cooperation: Cooperative Education Importance: Acknowledges Need Interest in Health Problems: Asks Questions Perception: Coherent Willingness to Engage in Self-Management High Activities: Readiness to Engage in Self-Management High Activities: Electronic Signature(s) Signed: 04/21/2021 4:44:26 PM By: Levora Dredge Entered By: Levora Dredge on 04/21/2021 13:24:34 Molly [name], [name] ([id number]) -------------------------------------------------------------------------------- Fall Risk Assessment Details Patient Name: Molly [name], [name] Date of Service: 04/21/2021 12:45 PM Medical Record Number: [id number] Patient Account Number: [id number] Date of Birth/Sex:  May 06, 1945 (76 y.o. F) Treating RN: Levora Dredge Primary Care Mickala Laton: Josephine Cables Other Clinician: Referring Oswin Johal: Josephine Cables Treating Oshea Percival/Extender: Skipper Cliche in Treatment: 0 Fall Risk Assessment Items Have you had 2 or more falls in the last 12 monthso 0 No Have you had any fall that resulted in injury in the last 12 monthso 0 No FALLS RISK SCREEN History of falling - immediate or within 3 months 0 No Secondary diagnosis (Do you have 2 or more medical diagnoseso) 0 No Ambulatory aid None/bed rest/wheelchair/nurse 0 Yes Crutches/cane/walker 0 No Furniture 0 No Intravenous therapy Access/Saline/Heparin Lock 0 No Gait/Transferring Normal/ bed rest/ wheelchair 0 Yes Weak (short steps with or  without shuffle, stooped but able to lift head while walking, may 0 No seek support from furniture) Impaired (short steps with shuffle, may have difficulty arising from chair, head down, impaired 0 No balance) Mental Status Oriented to own ability 0 Yes Electronic Signature(s) Signed: 04/21/2021 4:44:26 PM By: Levora Dredge Entered By: Levora Dredge on 04/21/2021 13:25:12 Molly [name], [name] ([id number]) -------------------------------------------------------------------------------- Foot Assessment Details Patient Name: Molly [name], [name] Date of Service: 04/21/2021 12:45 PM Medical Record Number: [id number] Patient Account Number: [id number] Date of Birth/Sex: November 12, 1945 (76 y.o. F) Treating RN: Levora Dredge Primary Care Seri Kimmer: Josephine Cables Other Clinician: Referring Taja Pentland: Josephine Cables Treating Lon Klippel/Extender: Jeri Cos Weeks in Treatment: 0 Foot Assessment Items Site Locations + = Sensation present, - = Sensation absent, C = Callus, U = Ulcer R = Redness, W = Warmth, M = Maceration, PU = Pre-ulcerative lesion F = Fissure, S = Swelling, D = Dryness Assessment Right: Left: Other Deformity: No No Prior Foot Ulcer: No No Prior  Amputation: No No Charcot Joint: No No Ambulatory Status: Non-ambulatory Assistance Device: Wheelchair Gait: Unsteady Notes wound is to glute/sacrum area, husband states no wounds to bilat LE Electronic Signature(s) Signed: 04/21/2021 4:44:26 PM By: Levora Dredge Entered By: Levora Dredge on 04/21/2021 13:27:57 Molly [name], [name] ([id number]) -------------------------------------------------------------------------------- Nutrition Risk Screening Details Patient Name: Molly [name], Molly [name] P. Date of Service: 04/21/2021 12:45 PM Medical Record Number: [id number] Patient Account Number: [id number] Date of Birth/Sex: [birth year] (76 y.o. F) Treating RN: Levora Dredge Primary Care Zonia Caplin: Josephine Cables Other Clinician: Referring Caedin Mogan: Josephine Cables Treating Darnette Lampron/Extender: Jeri Cos Weeks in Treatment: 0 Height (in): 66 Weight (lbs): 265.2 Body Mass Index (BMI): 42.8 Nutrition Risk Screening Items Score Screening NUTRITION RISK SCREEN: I have an illness or condition that made me change the kind and/or amount of food I eat 0 No I eat fewer than two meals per day 0 No I eat few fruits and vegetables, or milk products 0 No I have three or more drinks of beer, liquor or wine almost every day 0 No I have tooth or mouth problems that make it hard for me to eat 0 No I don't always have enough money to buy the food I need 0 No I eat alone most of the time 0 No I take three or more different prescribed or over-the-counter drugs a day 0 No Without wanting to, I have lost or gained 10 pounds in the last six months 0 No I am not always physically able to shop, cook and/or feed myself 0 No Nutrition Protocols Good Risk Protocol 0 No interventions needed Moderate Risk Protocol High Risk Proctocol Risk Level: Good Risk Score: 0 Electronic Signature(s) Signed: 04/21/2021 4:44:26 PM By: Levora Dredge Entered By: Levora Dredge on 04/21/2021 13:25:53

## 2021-04-21 NOTE — Progress Notes (Signed)
Frank, ELDORIS BEISER ([id number]) Visit Report for 04/21/2021 Allergy List Details Patient Name: Molly [name], [name] Date of Service: 04/21/2021 12:45 PM Medical Record Number: [id number] Patient Account Number: [id number] Date of Birth/Sex: 02/01/1946 (76 y.o. F) Treating RN: Angelina Pih Primary Care Rashana Andrew: Ethelda Chick Other Clinician: Referring Dontrelle Mazon: Ethelda Chick Treating Jadesola Poynter/Extender: Allen Derry Weeks in Treatment: 0 Allergies Active Allergies No Known Drug Allergies Allergy Notes Electronic Signature(s) Signed: 04/21/2021 4:44:26 PM By: Angelina Pih Entered By: Angelina Pih on 04/21/2021 13:17:13 Molly [name], [name] ([id number]) -------------------------------------------------------------------------------- Arrival Information Details Patient Name: Topham, Janny P. Date of Service: 04/21/2021 12:45 PM Medical Record Number: [id number] Patient Account Number: [id number] Date of Birth/Sex: Mar 08, 1946 (76 y.o. F) Treating RN: Angelina Pih Primary Care Stephine Langbehn: Ethelda Chick Other Clinician: Referring Cristan Scherzer: Ethelda Chick Treating Tamberlyn Midgley/Extender: Rowan Blase in Treatment: 0 Visit Information Patient Arrived: Wheel Chair Arrival Time: 13:03 Accompanied By: husband Transfer Assistance: Nurse, adult Patient Identification Verified: Yes Secondary Verification Process Completed: Yes Patient Has Alerts: Yes Patient Alerts: Patient on Blood Thinner History Since Last Visit Added or deleted any medications: Yes Any new allergies or adverse reactions: No Had a fall or experienced change in activities of daily living that may affect risk of falls: No Hospitalized since last visit: No Has Dressing in Place as Prescribed: Yes Electronic Signature(s) Signed: 04/21/2021 4:44:26 PM By: Angelina Pih Entered By: Angelina Pih on 04/21/2021 13:17:02 Molly [name], [name]  ([id number]) -------------------------------------------------------------------------------- Clinic Level of Care Assessment Details Patient Name: Broughton, Aseret P. Date of Service: 04/21/2021 12:45 PM Medical Record Number: [id number] Patient Account Number: [id number] Date of Birth/Sex: 1945/05/07 (76 y.o. F) Treating RN: Angelina Pih Primary Care Leilynn Pilat: Ethelda Chick Other Clinician: Referring Levonne Carreras: Ethelda Chick Treating Devita Nies/Extender: Rowan Blase in Treatment: 0 Clinic Level of Care Assessment Items TOOL 2 Quantity Score X - Use when only an EandM is performed on the INITIAL visit 1 0 ASSESSMENTS - Nursing Assessment / Reassessment []  - General Physical Exam (combine w/ comprehensive assessment (listed just below) when performed on new 0 pt. evals) []  - 0 Comprehensive Assessment (HX, ROS, Risk Assessments, Wounds Hx, etc.) ASSESSMENTS - Wound and Skin Assessment / Reassessment X - Simple Wound Assessment / Reassessment - one wound 1 5 []  - 0 Complex Wound Assessment / Reassessment - multiple wounds []  - 0 Dermatologic / Skin Assessment (not related to wound area) ASSESSMENTS - Ostomy and/or Continence Assessment and Care []  - Incontinence Assessment and Management 0 []  - 0 Ostomy Care Assessment and Management (repouching, etc.) PROCESS - Coordination of Care X - Simple Patient / Family Education for ongoing care 1 15 []  - 0 Complex (extensive) Patient / Family Education for ongoing care []  - 0 Staff obtains , Records, Test Results / Process Orders []  - 0 Staff telephones HHA, Nursing Homes / Clarify orders / etc []  - 0 Routine Transfer to another Facility (non-emergent condition) []  - 0 Routine Hospital Admission (non-emergent condition) []  - 0 New Admissions / / Ordering NPWT, Apligraf, etc. []  - 0 Emergency Hospital Admission (emergent condition) X- 1 10 Simple Discharge Coordination []  - 0 Complex  (extensive) Discharge Coordination PROCESS - Special Needs []  - Pediatric / Minor Patient Management 0 []  - 0 Isolation Patient Management []  - 0 Hearing / Language / Visual special needs []  - 0 Assessment of Community assistance (transportation, D/C planning, etc.) []  - 0 Additional assistance / Altered mentation []  - 0 Support Surface(s) Assessment (bed, cushion, seat, etc.) INTERVENTIONS - Wound Cleansing /  Measurement X - Wound Imaging (photographs - any number of wounds) 1 5 []  - 0 Wound Tracing (instead of photographs) X- 1 5 Simple Wound Measurement - one wound []  - 0 Complex Wound Measurement - multiple wounds Molly [name], [name] ([id number]) X- 1 5 Simple Wound Cleansing - one wound []  - 0 Complex Wound Cleansing - multiple wounds INTERVENTIONS - Wound Dressings X - Small Wound Dressing one or multiple wounds 1 10 []  - 0 Medium Wound Dressing one or multiple wounds []  - 0 Large Wound Dressing one or multiple wounds []  - 0 Application of Medications - injection INTERVENTIONS - Miscellaneous []  - External ear exam 0 []  - 0 Specimen Collection (cultures, biopsies, blood, body fluids, etc.) []  - 0 Specimen(s) / Culture(s) sent or taken to Lab for analysis []  - 0 Patient Transfer (multiple staff / Nurse, adultHoyer Lift / Similar devices) []  - 0 Simple Staple / Suture removal (25 or less) []  - 0 Complex Staple / Suture removal (26 or more) []  - 0 Hypo / Hyperglycemic Management (close monitor of Blood Glucose) []  - 0 Ankle / Brachial Index (ABI) - do not check if billed separately Has the patient been seen at the hospital within the last three years: Yes Total Score: 55 Level Of Care: New/Established - Level 2 Electronic Signature(s) Signed: 04/21/2021 4:44:26 PM By: Angelina PihGordon, Caitlin Entered By: Angelina PihGordon, Caitlin on 04/21/2021 16:34:58 Molly [name], [name] ([id number]) -------------------------------------------------------------------------------- Encounter Discharge  Information Details Patient Name: Molly [name], [name] Date of Service: 04/21/2021 12:45 PM Medical Record Number: [id number] Patient Account Number: [id number] Date of Birth/Sex: 02-22-1946 (76 y.o. F) Treating RN: Angelina PihGordon, Caitlin Primary Care Justine Dines: Ethelda Chickoberts, Caroline Other Clinician: Referring Verlie Hellenbrand: Ethelda Chickoberts, Caroline Treating Canyon Lohr/Extender: Rowan BlaseStone, Hoyt Weeks in Treatment: 0 Encounter Discharge Information Items Discharge Condition: Stable Ambulatory Status: Wheelchair Discharge Destination: Home Transportation: Private Auto Accompanied By: husband Schedule Follow-up Appointment: Yes Clinical Summary of Care: Electronic Signature(s) Signed: 04/21/2021 4:36:30 PM By: Angelina PihGordon, Caitlin Entered By: Angelina PihGordon, Caitlin on 04/21/2021 16:36:30 Molly [name], [name] ([id number]) -------------------------------------------------------------------------------- Lower Extremity Assessment Details Patient Name: Molly [name], [name] Date of Service: 04/21/2021 12:45 PM Medical Record Number: [id number] Patient Account Number: [id number] Date of Birth/Sex: 02-22-1946 (76 y.o. F) Treating RN: Angelina PihGordon, Caitlin Primary Care Amario Longmore: Ethelda Chickoberts, Caroline Other Clinician: Referring Fransisco Messmer: Ethelda Chickoberts, Caroline Treating Treyson Axel/Extender: Allen DerryStone, Hoyt Weeks in Treatment: 0 Notes no wounds to bilat LE, only wound noted is to midline sacrum Electronic Signature(s) Signed: 04/21/2021 4:44:26 PM By: Angelina PihGordon, Caitlin Entered By: Angelina PihGordon, Caitlin on 04/21/2021 13:37:25 Molly [name], [name] ([id number]) -------------------------------------------------------------------------------- Multi Wound Chart Details Patient Name: Molly [name], [name] Date of Service: 04/21/2021 12:45 PM Medical Record Number: [id number] Patient Account Number: [id number] Date of Birth/Sex: 02-22-1946 (76 y.o. F) Treating RN: Angelina PihGordon, Caitlin Primary Care Syler Norcia: Ethelda Chickoberts, Caroline Other Clinician: Referring Valli Randol: Ethelda Chickoberts, Caroline Treating  Kissy Cielo/Extender: Rowan BlaseStone, Hoyt Weeks in Treatment: 0 Vital Signs Height(in): 66 Pulse(bpm): 96 Weight(lbs): 163 Blood Pressure(mmHg): 115/71 Body Mass Index(BMI): 26.3 Temperature(F): 97.5 Respiratory Rate(breaths/min): 18 Photos: [N/A:N/A] Wound Location: Midline Sacrum N/A N/A Wounding Event: Pressure Injury N/A N/A Primary Etiology: Pressure Ulcer N/A N/A Comorbid History: Cataracts, Anemia, Asthma, N/A N/A Hypertension, Type II Diabetes, Osteoarthritis, Neuropathy Date Acquired: 11/28/2020 N/A N/A Weeks of Treatment: 0 N/A N/A Wound Status: Open N/A N/A Wound Recurrence: No N/A N/A Measurements L x W x D (cm) 1x0.5x0.3 N/A N/A Area (cm) : 0.393 N/A N/A Volume (cm) : 0.118 N/A N/A % Reduction in Area: 0.00% N/A N/A % Reduction in Volume: 0.00% N/A N/A Starting Position 1 (  o'clock): 9 Ending Position 1 (o'clock): 3 Maximum Distance 1 (cm): 2.5 Undermining: Yes N/A N/A Classification: Category/Stage III N/A N/A Exudate Amount: Medium N/A N/A Exudate Type: Serous N/A N/A Exudate Color: amber N/A N/A Wound Margin: Distinct, outline attached N/A N/A Granulation Amount: Large (67-100%) N/A N/A Granulation Quality: Red N/A N/A Necrotic Amount: Small (1-33%) N/A N/A Exposed Structures: Fat Layer (Subcutaneous Tissue): N/A N/A Yes Epithelialization: None N/A N/A Treatment Notes Electronic Signature(s) Signed: 04/21/2021 4:44:26 PM By: Angelina Pih Entered By: Angelina Pih on 04/21/2021 13:54:07 Boyland, Alyn P. ([id number]) -------------------------------------------------------------------------------- Multi-Disciplinary Care Plan Details Patient Name: Molly [name], [name] Date of Service: 04/21/2021 12:45 PM Medical Record Number: [id number] Patient Account Number: [id number] Date of Birth/Sex: [birth year] (76 y.o. F) Treating RN: Angelina Pih Primary Care Sennie Borden: Ethelda Chick Other Clinician: Referring Karma Ansley: Ethelda Chick Treating  Haedyn Ancrum/Extender: Rowan Blase in Treatment: 0 Active Inactive Orientation to the Wound Care Program Nursing Diagnoses: Knowledge deficit related to the wound healing center program Goals: Patient/caregiver will verbalize understanding of the Wound Healing Center Program Date Initiated: 04/21/2021 Target Resolution Date: 04/21/2021 Goal Status: Active Interventions: Provide education on orientation to the wound center Notes: Pressure Nursing Diagnoses: Knowledge deficit related to causes and risk factors for pressure ulcer development Knowledge deficit related to management of pressures ulcers Potential for impaired tissue integrity related to pressure, friction, moisture, and shear Goals: Patient will remain free from development of additional pressure ulcers Date Initiated: 04/21/2021 Target Resolution Date: 04/21/2021 Goal Status: Active Patient will remain free of pressure ulcers Date Initiated: 04/21/2021 Target Resolution Date: 04/21/2021 Goal Status: Active Patient/caregiver will verbalize risk factors for pressure ulcer development Date Initiated: 04/21/2021 Target Resolution Date: 04/21/2021 Goal Status: Active Patient/caregiver will verbalize understanding of pressure ulcer management Date Initiated: 04/21/2021 Target Resolution Date: 04/21/2021 Goal Status: Active Interventions: Assess: immobility, friction, shearing, incontinence upon admission and as needed Assess offloading mechanisms upon admission and as needed Assess potential for pressure ulcer upon admission and as needed Provide education on pressure ulcers Notes: Wound/Skin Impairment Nursing Diagnoses: Impaired tissue integrity Knowledge deficit related to ulceration/compromised skin integrity Goals: Ulcer/skin breakdown will have a volume reduction of 30% by week 4 Date Initiated: 04/21/2021 Target Resolution Date: 05/19/2021 EMALI, HEYWARD ([id number]) Goal Status: Active Ulcer/skin breakdown  will have a volume reduction of 50% by week 8 Date Initiated: 04/21/2021 Target Resolution Date: 06/16/2021 Goal Status: Active Ulcer/skin breakdown will have a volume reduction of 80% by week 12 Date Initiated: 04/21/2021 Target Resolution Date: 07/14/2021 Goal Status: Active Ulcer/skin breakdown will heal within 14 weeks Date Initiated: 04/21/2021 Target Resolution Date: 07/28/2021 Goal Status: Active Interventions: Assess patient/caregiver ability to obtain necessary supplies Assess patient/caregiver ability to perform ulcer/skin care regimen upon admission and as needed Assess ulceration(s) every visit Provide education on ulcer and skin care Notes: Electronic Signature(s) Signed: 04/21/2021 4:44:26 PM By: Angelina Pih Entered By: Angelina Pih on 04/21/2021 13:53:29 Molly [name], [name] ([id number]) -------------------------------------------------------------------------------- Pain Assessment Details Patient Name: Molly [name], Molly [name] P. Date of Service: 04/21/2021 12:45 PM Medical Record Number: [id number] Patient Account Number: [id number] Date of Birth/Sex: 04-30-1945 (76 y.o. F) Treating RN: Angelina Pih Primary Care Miosha Behe: Ethelda Chick Other Clinician: Referring Jakob Kimberlin: Ethelda Chick Treating Tycho Cheramie/Extender: Rowan Blase in Treatment: 0 Active Problems Location of Pain Severity and Description of Pain Patient Has Paino Yes Site Locations Rate the pain. Current Pain Level: 8 Pain Management and Medication Current Pain Management: Notes pt states on/off pain at wound site on sacrum 8/10 that is burning Electronic Signature(s)  Signed: 04/21/2021 4:44:26 PM By: Angelina PihGordon, Caitlin Entered By: Angelina PihGordon, Caitlin on 04/21/2021 13:16:24 Isaacs, Shawntavia Demetrius CharityP. ([id number]) -------------------------------------------------------------------------------- Patient/Caregiver Education Details Patient Name: Nicoson, Toni P. Date of Service: 04/21/2021 12:45 PM Medical  Record Number: [id number] Patient Account Number: [id number] Date of Birth/Gender: [birth year] (76 y.o. F) Treating RN: Angelina PihGordon, Caitlin Primary Care Physician: Ethelda Chickoberts, Caroline Other Clinician: Referring Physician: Ethelda Chickoberts, Caroline Treating Physician/Extender: Rowan BlaseStone, Hoyt Weeks in Treatment: 0 Education Assessment Education Provided To: Patient and Caregiver Education Topics Provided Pressure: Handouts: Pressure Ulcers: Care and Offloading Methods: Explain/Verbal Responses: State content correctly Welcome To The Wound Care Center: Handouts: Welcome To The [facility] Methods: Explain/Verbal Responses: State content correctly Wound/Skin Impairment: Handouts: Caring for Your Ulcer Methods: Explain/Verbal Responses: State content correctly Electronic Signature(s) Signed: 04/21/2021 4:44:26 PM By: Angelina PihGordon, Caitlin Entered By: Angelina PihGordon, Caitlin on 04/21/2021 16:35:36 Prospero, Amaka P. ([id number]) -------------------------------------------------------------------------------- Wound Assessment Details Patient Name: Swailes, Atara P. Date of Service: 04/21/2021 12:45 PM Medical Record Number: [id number] Patient Account Number: [id number] Date of Birth/Sex: [birth year] (76 y.o. F) Treating RN: Angelina PihGordon, Caitlin Primary Care Austynn Pridmore: Ethelda Chickoberts, Caroline Other Clinician: Referring Dhruv Christina: Ethelda Chickoberts, Caroline Treating Bingham Millette/Extender: Allen DerryStone, Hoyt Weeks in Treatment: 0 Wound Status Wound Number: 3 Primary Pressure Ulcer Etiology: Wound Location: Midline Sacrum Wound Open Wounding Event: Pressure Injury Status: Date Acquired: 11/28/2020 Comorbid Cataracts, Anemia, Asthma, Hypertension, Type II Weeks Of Treatment: 0 History: Diabetes, Osteoarthritis, Neuropathy Clustered Wound: No Photos Wound Measurements Length: (cm) 1 % Red Width: (cm) 0.5 % Red Depth: (cm) 0.3 Epith Area: (cm) 0.393 Tunn Volume: (cm) 0.118 Unde St En Ma uction in Area: 0% uction in Volume:  0% elialization: None eling: No rmining: Yes arting Position (o'clock): 9 ding Position (o'clock): 3 ximum Distance: (cm) 2.5 Wound Description Classification: Category/Stage III Foul Wound Margin: Distinct, outline attached Slou Exudate Amount: Medium Exudate Type: Serous Exudate Color: amber Odor After Cleansing: No gh/Fibrino No Wound Bed Granulation Amount: Large (67-100%) Exposed Structure Granulation Quality: Red Fat Layer (Subcutaneous Tissue) Exposed: Yes Necrotic Amount: Small (1-33%) Necrotic Quality: Adherent Slough Treatment Notes Wound #3 (Sacrum) Wound Laterality: Midline Cleanser Peri-Wound Care Gordan, Jahnavi P. ([id number]) Topical Primary Dressing Prisma 4.34 (in) Discharge Instruction: pack wound with dry prisma using a q tip Secondary Dressing Gauze Discharge Instruction: As directed:, moistened with saline Secured With zetuvit silicone boarder Discharge Instruction: placed over wound Compression Wrap Compression Stockings Add-Ons Electronic Signature(s) Signed: 04/21/2021 1:50:15 PM By: Lenda KelpStone III, Hoyt PA-C Signed: 04/21/2021 4:44:26 PM By: Angelina PihGordon, Caitlin Entered By: Lenda KelpStone III, Hoyt on 04/21/2021 13:50:15 Sliney, Lauriann P. ([id number]) -------------------------------------------------------------------------------- Vitals Details Patient Name: Mayhan, Lorrayne P. Date of Service: 04/21/2021 12:45 PM Medical Record Number: [id number] Patient Account Number: [id number] Date of Birth/Sex: [birth year] (76 y.o. F) Treating RN: Angelina PihGordon, Caitlin Primary Care Damire Remedios: Ethelda Chickoberts, Caroline Other Clinician: Referring Parissa Chiao: Ethelda Chickoberts, Caroline Treating Loisann Roach/Extender: Rowan BlaseStone, Hoyt Weeks in Treatment: 0 Vital Signs Time Taken: 13:15 Temperature (F): 97.5 Height (in): 66 Pulse (bpm): 96 Source: Stated Respiratory Rate (breaths/min): 18 Weight (lbs): 163 Blood Pressure (mmHg): 115/71 Source: Stated Reference Range: 80 - 120 mg / dl Body Mass Index  (BMI): 26.3 Notes patient unable to bear weight to bilat LE Electronic Signature(s) Signed: 04/21/2021 4:44:26 PM By: Angelina PihGordon, Caitlin Entered By: Angelina PihGordon, Caitlin on 04/21/2021 13:29:18

## 2021-05-05 ENCOUNTER — Encounter: Payer: Medicare HMO | Attending: Physician Assistant | Admitting: Physician Assistant

## 2021-05-05 ENCOUNTER — Other Ambulatory Visit: Payer: Self-pay

## 2021-05-05 DIAGNOSIS — L89153 Pressure ulcer of sacral region, stage 3: Secondary | ICD-10-CM | POA: Insufficient documentation

## 2021-05-05 DIAGNOSIS — F01A0 Vascular dementia, mild, without behavioral disturbance, psychotic disturbance, mood disturbance, and anxiety: Secondary | ICD-10-CM | POA: Diagnosis not present

## 2021-05-05 DIAGNOSIS — M6281 Muscle weakness (generalized): Secondary | ICD-10-CM | POA: Diagnosis not present

## 2021-05-05 DIAGNOSIS — I10 Essential (primary) hypertension: Secondary | ICD-10-CM | POA: Diagnosis not present

## 2021-05-05 DIAGNOSIS — E119 Type 2 diabetes mellitus without complications: Secondary | ICD-10-CM | POA: Insufficient documentation

## 2021-05-05 NOTE — Progress Notes (Addendum)
Molly [name], Molly [name] ([id number]) Visit Report for 05/05/2021 Chief Complaint Document Details Patient Name: Molly [name], [name] Date of Service: 05/05/2021 3:00 PM Medical Record Number: [id number] Patient Account Number: [id number] Date of Birth/Sex: 06-Jan-1946 (76 y.o. F) Treating RN: Angelina Pih Primary Care Provider: Ethelda Chick Other Clinician: Referring Provider: Ethelda Chick Treating Provider/Extender: Rowan Blase in Treatment: 2 Information Obtained from: Patient Chief Complaint Sacral Pressure Ulcer Stage 3 Electronic Signature(s) Signed: 05/05/2021 3:10:04 PM By: Lenda Kelp PA-C Entered By: Lenda Kelp on 05/05/2021 15:10:04 Tino, Natassia P. ([id number]) -------------------------------------------------------------------------------- HPI Details Patient Name: Molly [name], [name] Date of Service: 05/05/2021 3:00 PM Medical Record Number: [id number] Patient Account Number: [id number] Date of Birth/Sex: [birth year] (76 y.o. F) Treating RN: Angelina Pih Primary Care Provider: Ethelda Chick Other Clinician: Referring Provider: Ethelda Chick Treating Provider/Extender: Rowan Blase in Treatment: 2 History of Present Illness HPI Description: Readmission: 04/21/2021 upon evaluation today patient presents for reevaluation here in the clinic those been since 2016 since she was last here. I have cared for her husband more recently. With that being said she tells me currently that she is having a lot of discomfort with her wound. She does have some dementia issues. At one point she actually told the nurse that she goes horseback riding regularly. Nonetheless she is generally weak and not really able to even sit or stand or walk so that obviously is not the case. Either way the biggest issue here is that she has a sacral wound. She has a gel overlay for her hospital bed she generally lays on her side so I think that she is not really getting pressure too  much there. The only time she is really in her chair is when she is coming for doctor's appointment. With all that being said I do believe that the patient is experiencing some issues here with increased pain from this wound but I do not see any signs of active infection at this time. She has a history of diabetes mellitus type 2 although this seems to be under pretty good control he checks her blood sugar that is her husband occasionally gives insulin as needed but again he tells me he has not had to for some time. She also stated to be hypertensive though her blood pressure was good today and it does not appear that she is on any specific medications currently. A wound VAC has not been attempted at this point. 05/05/2021 upon evaluation today patient appears to be doing well with regard to her wound. I do feel like we are showing some signs of improvement here which is great news. Fortunately I do not see any evidence of active infection locally or systemically at this point which is also excellent news. No fevers, chills, nausea, vomiting, or diarrhea. Electronic Signature(s) Signed: 05/05/2021 3:58:16 PM By: Lenda Kelp PA-C Entered By: Lenda Kelp on 05/05/2021 15:58:16 Canova, Serena P. ([id number]) -------------------------------------------------------------------------------- Physical Exam Details Patient Name: Molly [name], [name] Date of Service: 05/05/2021 3:00 PM Medical Record Number: [id number] Patient Account Number: [id number] Date of Birth/Sex: May 19, 1945 (76 y.o. F) Treating RN: Angelina Pih Primary Care Provider: Ethelda Chick Other Clinician: Referring Provider: Ethelda Chick Treating Provider/Extender: Allen Derry Weeks in Treatment: 2 Constitutional Well-nourished and well-hydrated in no acute distress. Respiratory normal breathing without difficulty. Psychiatric this patient is able to make decisions and demonstrates good insight into disease process.  Alert and Oriented x 3. pleasant and cooperative. Notes Upon inspection patient's wound bed actually showed  signs of good granulation epithelization at this point. Fortunately there does not appear to be any evidence of active infection locally or systemically at this time and this is good news. Electronic Signature(s) Signed: 05/05/2021 3:58:58 PM By: Lenda Kelp PA-C Entered By: Lenda Kelp on 05/05/2021 15:58:58 Quirarte, Kendi Demetrius Charity ([id number]) -------------------------------------------------------------------------------- Physician Orders Details Patient Name: Molly [name], [name] Date of Service: 05/05/2021 3:00 PM Medical Record Number: [id number] Patient Account Number: [id number] Date of Birth/Sex: [birth year] (76 y.o. F) Treating RN: Angelina Pih Primary Care Provider: Ethelda Chick Other Clinician: Referring Provider: Ethelda Chick Treating Provider/Extender: Rowan Blase in Treatment: 2 Verbal / Phone Orders: No Diagnosis Coding ICD-10 Coding Code Description L89.153 Pressure ulcer of sacral region, stage 3 E11.622 Type 2 diabetes mellitus with other skin ulcer M62.81 Muscle weakness (generalized) I10 Essential (primary) hypertension F01.A0 Vascular dementia, mild, without behavioral disturbance, psychotic disturbance, mood disturbance, and anxiety Follow-up Appointments o Return Appointment in 2 weeks. o Nurse Visit as needed Home Health o Home Health Company: o [facility] for wound care. May utilize formulary equivalent dressing for wound treatment orders unless otherwise specified. Home Health Nurse may visit PRN to address patientos wound care needs. o Scheduled days for dressing changes to be completed; exception, patient has scheduled wound care visit that day. o **Please direct any NON-WOUND related issues/requests for orders to patient's Primary Care Physician. **If current dressing causes regression in wound condition, may  D/C ordered dressing product/s and apply Normal Saline Moist Dressing daily until next Wound Healing Center or Other MD appointment. **Notify Wound Healing Center of regression in wound condition at [phone]. Bathing/ Shower/ Hygiene o Wash wounds with antibacterial soap and water. o May shower; gently cleanse wound with antibacterial soap, rinse and pat dry prior to dressing wounds o No tub bath. Off-Loading o Turn and reposition every 2 hours Wound Treatment Wound #3 - Sacrum Wound Laterality: Midline Primary Dressing: Prisma 4.34 (in) 3 x Per Week/30 Days Discharge Instructions: pack wound with dry prisma using a q tip Secondary Dressing: Gauze 3 x Per Week/30 Days Discharge Instructions: As directed:, moistened with saline Secured With: zetuvit silicone boarder 3 x Per Week/30 Days Discharge Instructions: placed over wound Electronic Signature(s) Signed: 05/06/2021 10:02:31 AM By: Lenda Kelp PA-C Signed: 05/06/2021 4:32:25 PM By: Angelina Pih Previous Signature: 05/05/2021 5:12:29 PM Version By: Angelina Pih Entered By: Angelina Pih on 05/06/2021 08:32:03 Pallas, Addalyne P. ([id number]) -------------------------------------------------------------------------------- Problem List Details Patient Name: Lunney, Latravia P. Date of Service: 05/05/2021 3:00 PM Medical Record Number: [id number] Patient Account Number: [id number] Date of Birth/Sex: Jun 10, 1945 (76 y.o. F) Treating RN: Angelina Pih Primary Care Provider: Ethelda Chick Other Clinician: Referring Provider: Ethelda Chick Treating Provider/Extender: Rowan Blase in Treatment: 2 Active Problems ICD-10 Encounter Code Description Active Date MDM Diagnosis L89.153 Pressure ulcer of sacral region, stage 3 04/21/2021 No Yes E11.622 Type 2 diabetes mellitus with other skin ulcer 04/21/2021 No Yes M62.81 Muscle weakness (generalized) 04/21/2021 No Yes I10 Essential (primary) hypertension 04/21/2021  No Yes F01.A0 Vascular dementia, mild, without behavioral disturbance, psychotic 04/21/2021 No Yes disturbance, mood disturbance, and anxiety Inactive Problems Resolved Problems Electronic Signature(s) Signed: 05/05/2021 3:09:57 PM By: Lenda Kelp PA-C Entered By: Lenda Kelp on 05/05/2021 15:09:56 Freedman, Mikki P. ([id number]) -------------------------------------------------------------------------------- Progress Note Details Patient Name: Molly [name], [name] Date of Service: 05/05/2021 3:00 PM Medical Record Number: [id number] Patient Account Number: [id number] Date of Birth/Sex: 08-Sep-1945 (76 y.o. F) Treating RN: Angelina Pih Primary Care Provider: Ethelda Chick  Other Clinician: Referring Provider: Ethelda Chickoberts, Caroline Treating Provider/Extender: Rowan BlaseStone, Ovie Cornelio Weeks in Treatment: 2 Subjective Chief Complaint Information obtained from Patient Sacral Pressure Ulcer Stage 3 History of Present Illness (HPI) Readmission: 04/21/2021 upon evaluation today patient presents for reevaluation here in the clinic those been since 2016 since she was last here. I have cared for her husband more recently. With that being said she tells me currently that she is having a lot of discomfort with her wound. She does have some dementia issues. At one point she actually told the nurse that she goes horseback riding regularly. Nonetheless she is generally weak and not really able to even sit or stand or walk so that obviously is not the case. Either way the biggest issue here is that she has a sacral wound. She has a gel overlay for her hospital bed she generally lays on her side so I think that she is not really getting pressure too much there. The only time she is really in her chair is when she is coming for doctor's appointment. With all that being said I do believe that the patient is experiencing some issues here with increased pain from this wound but I do not see any signs of active infection  at this time. She has a history of diabetes mellitus type 2 although this seems to be under pretty good control he checks her blood sugar that is her husband occasionally gives insulin as needed but again he tells me he has not had to for some time. She also stated to be hypertensive though her blood pressure was good today and it does not appear that she is on any specific medications currently. A wound VAC has not been attempted at this point. 05/05/2021 upon evaluation today patient appears to be doing well with regard to her wound. I do feel like we are showing some signs of improvement here which is great news. Fortunately I do not see any evidence of active infection locally or systemically at this point which is also excellent news. No fevers, chills, nausea, vomiting, or diarrhea. Objective Constitutional Well-nourished and well-hydrated in no acute distress. Vitals Time Taken: 3:35 PM, Height: 66 in, Weight: 163 lbs, BMI: 26.3, Pulse: 88 bpm, Respiratory Rate: 18 breaths/min, Blood Pressure: 100/67 mmHg. General Notes: unable to obtain temp Respiratory normal breathing without difficulty. Psychiatric this patient is able to make decisions and demonstrates good insight into disease process. Alert and Oriented x 3. pleasant and cooperative. General Notes: Upon inspection patient's wound bed actually showed signs of good granulation epithelization at this point. Fortunately there does not appear to be any evidence of active infection locally or systemically at this time and this is good news. Integumentary (Hair, Skin) Wound #3 status is Open. Original cause of wound was Pressure Injury. The date acquired was: 11/28/2020. The wound has been in treatment 2 weeks. The wound is located on the Midline Sacrum. The wound measures 1.2cm length x 0.5cm width x 2.1cm depth; 0.471cm^2 area and 0.99cm^3 volume. There is Fat Layer (Subcutaneous Tissue) exposed. There is no tunneling noted, however,  there is undermining starting at 9:00 and ending at 3:00 with a maximum distance of 2.1cm. There is a medium amount of serosanguineous drainage noted. The wound margin is distinct with the outline attached to the wound base. There is large (67-100%) red granulation within the wound bed. There is a small (1-33%) amount of necrotic tissue within the wound bed including Adherent Slough. Lassen, Sharmayne P. ([id number]) Assessment Active  Problems ICD-10 Pressure ulcer of sacral region, stage 3 Type 2 diabetes mellitus with other skin ulcer Muscle weakness (generalized) Essential (primary) hypertension Vascular dementia, mild, without behavioral disturbance, psychotic disturbance, mood disturbance, and anxiety Plan Follow-up Appointments: Return Appointment in 2 weeks. Nurse Visit as needed Home Health: Home Health Company: [facility] for wound care. May utilize formulary equivalent dressing for wound treatment orders unless otherwise specified. Home Health Nurse may visit PRN to address patient s wound care needs. Scheduled days for dressing changes to be completed; exception, patient has scheduled wound care visit that day. **Please direct any NON-WOUND related issues/requests for orders to patient's Primary Care Physician. **If current dressing causes regression in wound condition, may D/C ordered dressing product/s and apply Normal Saline Moist Dressing daily until next Wound Healing Center or Other MD appointment. **Notify Wound Healing Center of regression in wound condition at [phone]. Bathing/ Shower/ Hygiene: Wash wounds with antibacterial soap and water. May shower; gently cleanse wound with antibacterial soap, rinse and pat dry prior to dressing wounds No tub bath. Off-Loading: Turn and reposition every 2 hours WOUND #3: - Sacrum Wound Laterality: Midline Primary Dressing: Prisma 4.34 (in) 3 x Per Week/30 Days Discharge Instructions: pack wound with dry prisma  using a q tip Secondary Dressing: Gauze 3 x Per Week/30 Days Discharge Instructions: As directed:, moistened with saline Secured With: zetuvit silicone boarder 3 x Per Week/30 Days Discharge Instructions: placed over wound 1. Would recommend at this point that we go ahead and continue with the collagen packed into the wound which I think is doing a very good job here. 2. Also, recommend that we have the patient continue with appropriate offloading she seems to stay off of this quite well which I think is excellent. 3. We are using gauze followed by a silicone bordered foam dressing. This has been doing very well and I think it is providing some good comfort relief at this point. We will see patient back for reevaluation in 1 week here in the clinic. If anything worsens or changes patient will contact our office for additional recommendations. Electronic Signature(s) Signed: 05/05/2021 3:59:46 PM By: Lenda Kelp PA-C Entered By: Lenda Kelp on 05/05/2021 15:59:46 Lakatos, Martia P. ([id number]) -------------------------------------------------------------------------------- SuperBill Details Patient Name: Hackenberg, Shadiyah P. Date of Service: 05/05/2021 Medical Record Number: [id number] Patient Account Number: [id number] Date of Birth/Sex: 04-08-1945 (76 y.o. F) Treating RN: Angelina Pih Primary Care Provider: Ethelda Chick Other Clinician: Referring Provider: Ethelda Chick Treating Provider/Extender: Rowan Blase in Treatment: 2 Diagnosis Coding ICD-10 Codes Code Description [phone] Pressure ulcer of sacral region, stage 3 E11.622 Type 2 diabetes mellitus with other skin ulcer M62.81 Muscle weakness (generalized) I10 Essential (primary) hypertension F01.A0 Vascular dementia, mild, without behavioral disturbance, psychotic disturbance, mood disturbance, and anxiety Facility Procedures CPT4 Code: [id number] Description: [phone] - WOUND CARE VISIT-LEV 2 EST  PT Modifier: Quantity: 1 Physician Procedures CPT4 Code: [id number] Description: 99214 - WC PHYS LEVEL 4 - EST PT Modifier: Quantity: 1 CPT4 Code: Description: ICD-10 Diagnosis Description L89.153 Pressure ulcer of sacral region, stage 3 E11.622 Type 2 diabetes mellitus with other skin ulcer M62.81 Muscle weakness (generalized) I10 Essential (primary) hypertension Modifier: Quantity: Electronic Signature(s) Signed: 05/06/2021 8:32:37 AM By: Angelina Pih Signed: 05/06/2021 10:02:31 AM By: Lenda Kelp PA-C Previous Signature: 05/05/2021 4:00:08 PM Version By: Lenda Kelp PA-C Entered By: Angelina Pih on 05/06/2021 08:32:37

## 2021-05-05 NOTE — Progress Notes (Addendum)
Wiles, WYLODEAN LOZEAU ([id number]) Visit Report for 05/05/2021 Arrival Information Details Patient Name: Schnake, Camie P. Date of Service: 05/05/2021 3:00 PM Medical Record Number: [id number] Patient Account Number: [id number] Date of Birth/Sex: Oct 26, 1945 (76 y.o. F) Treating RN: Levora Dredge Primary Care Lacosta Hargan: Josephine Cables Other Clinician: Referring Dheeraj Hail: Josephine Cables Treating Bryleigh Ottaway/Extender: Skipper Cliche in Treatment: 2 Visit Information History Since Last Visit Added or deleted any medications: No Patient Arrived: Wheel Chair Any new allergies or adverse reactions: No Arrival Time: 15:42 Had a fall or experienced change in No Accompanied By: husband activities of daily living that may affect Transfer Assistance: Harrel Lemon Lift risk of falls: Patient Identification Verified: Yes Hospitalized since last visit: No Secondary Verification Process Completed: Yes Has Dressing in Place as Prescribed: Yes Patient Has Alerts: Yes Pain Present Now: Yes Patient Alerts: Patient on Blood Thinner Electronic Signature(s) Signed: 05/05/2021 5:12:29 PM By: Levora Dredge Entered By: Levora Dredge on 05/05/2021 15:43:27 Jury, Perlie P. ([id number]) -------------------------------------------------------------------------------- Clinic Level of Care Assessment Details Patient Name: Sedeno, Tifany P. Date of Service: 05/05/2021 3:00 PM Medical Record Number: [id number] Patient Account Number: [id number] Date of Birth/Sex: 1945/05/03 (76 y.o. F) Treating RN: Levora Dredge Primary Care Braxtyn Dorff: Josephine Cables Other Clinician: Referring Cecily Lawhorne: Josephine Cables Treating Isaic Syler/Extender: Skipper Cliche in Treatment: 2 Clinic Level of Care Assessment Items TOOL 4 Quantity Score []  - Use when only an EandM is performed on FOLLOW-UP visit 0 ASSESSMENTS - Nursing Assessment / Reassessment []  - Reassessment of Co-morbidities (includes updates in patient status)  0 []  - 0 Reassessment of Adherence to Treatment Plan ASSESSMENTS - Wound and Skin Assessment / Reassessment X - Simple Wound Assessment / Reassessment - one wound 1 5 []  - 0 Complex Wound Assessment / Reassessment - multiple wounds []  - 0 Dermatologic / Skin Assessment (not related to wound area) ASSESSMENTS - Focused Assessment []  - Circumferential Edema Measurements - multi extremities 0 []  - 0 Nutritional Assessment / Counseling / Intervention []  - 0 Lower Extremity Assessment (monofilament, tuning fork, pulses) []  - 0 Peripheral Arterial Disease Assessment (using hand held doppler) ASSESSMENTS - Ostomy and/or Continence Assessment and Care []  - Incontinence Assessment and Management 0 []  - 0 Ostomy Care Assessment and Management (repouching, etc.) PROCESS - Coordination of Care X - Simple Patient / Family Education for ongoing care 1 15 []  - 0 Complex (extensive) Patient / Family Education for ongoing care []  - 0 Staff obtains Programmer, systems, Records, Test Results / Process Orders []  - 0 Staff telephones HHA, Nursing Homes / Clarify orders / etc []  - 0 Routine Transfer to another Facility (non-emergent condition) []  - 0 Routine Hospital Admission (non-emergent condition) []  - 0 New Admissions / Biomedical engineer / Ordering NPWT, Apligraf, etc. []  - 0 Emergency Hospital Admission (emergent condition) X- 1 10 Simple Discharge Coordination []  - 0 Complex (extensive) Discharge Coordination PROCESS - Special Needs []  - Pediatric / Minor Patient Management 0 []  - 0 Isolation Patient Management []  - 0 Hearing / Language / Visual special needs []  - 0 Assessment of Community assistance (transportation, D/C planning, etc.) []  - 0 Additional assistance / Altered mentation []  - 0 Support Surface(s) Assessment (bed, cushion, seat, etc.) INTERVENTIONS - Wound Cleansing / Measurement Szydlowski, Alanya P. ([id number]) X- 1 5 Simple Wound Cleansing - one wound []  - 0 Complex  Wound Cleansing - multiple wounds X- 1 5 Wound Imaging (photographs - any number of wounds) []  - 0 Wound Tracing (instead of photographs) X- 1 5 Simple Wound Measurement -  one wound []  - 0 Complex Wound Measurement - multiple wounds INTERVENTIONS - Wound Dressings X - Small Wound Dressing one or multiple wounds 1 10 []  - 0 Medium Wound Dressing one or multiple wounds []  - 0 Large Wound Dressing one or multiple wounds []  - 0 Application of Medications - topical []  - 0 Application of Medications - injection INTERVENTIONS - Miscellaneous []  - External ear exam 0 []  - 0 Specimen Collection (cultures, biopsies, blood, body fluids, etc.) []  - 0 Specimen(s) / Culture(s) sent or taken to Lab for analysis []  - 0 Patient Transfer (multiple staff / Civil Service fast streamer / Similar devices) []  - 0 Simple Staple / Suture removal (25 or less) []  - 0 Complex Staple / Suture removal (26 or more) []  - 0 Hypo / Hyperglycemic Management (close monitor of Blood Glucose) []  - 0 Ankle / Brachial Index (ABI) - do not check if billed separately X- 1 5 Vital Signs Has the patient been seen at the hospital within the last three years: Yes Total Score: 60 Level Of Care: New/Established - Level 2 Electronic Signature(s) Signed: 05/06/2021 4:32:25 PM By: Levora Dredge Entered By: Levora Dredge on 05/06/2021 08:32:25 Ureta, Shonya P. ([id number]) -------------------------------------------------------------------------------- Encounter Discharge Information Details Patient Name: Leppert, Leanny P. Date of Service: 05/05/2021 3:00 PM Medical Record Number: [id number] Patient Account Number: [id number] Date of Birth/Sex: 03/15/1946 (76 y.o. F) Treating RN: Levora Dredge Primary Care Shandy Checo: Josephine Cables Other Clinician: Referring Mertha Clyatt: Josephine Cables Treating Montrice Montuori/Extender: Skipper Cliche in Treatment: 2 Encounter Discharge Information Items Discharge Condition: Stable Ambulatory  Status: Wheelchair Discharge Destination: Home Transportation: Private Auto Accompanied By: husband Schedule Follow-up Appointment: Yes Clinical Summary of Care: Electronic Signature(s) Signed: 05/06/2021 8:33:36 AM By: Levora Dredge Previous Signature: 05/05/2021 5:06:14 PM Version By: Levora Dredge Entered By: Levora Dredge on 05/06/2021 08:33:35 Norrington, Ivianna P. ([id number]) -------------------------------------------------------------------------------- Lower Extremity Assessment Details Patient Name: Nunziata, Salam P. Date of Service: 05/05/2021 3:00 PM Medical Record Number: [id number] Patient Account Number: [id number] Date of Birth/Sex: 1945-09-20 (76 y.o. F) Treating RN: Levora Dredge Primary Care Tallis Soledad: Josephine Cables Other Clinician: Referring Kamillah Didonato: Josephine Cables Treating Stehanie Ekstrom/Extender: Jeri Cos Weeks in Treatment: 2 Electronic Signature(s) Signed: 05/05/2021 5:12:29 PM By: Levora Dredge Entered By: Levora Dredge on 05/05/2021 15:50:23 Cadenas, Sabrie P. ([id number]) -------------------------------------------------------------------------------- Multi Wound Chart Details Patient Name: Porras, Taraoluwa P. Date of Service: 05/05/2021 3:00 PM Medical Record Number: [id number] Patient Account Number: [id number] Date of Birth/Sex: 1946/01/29 (76 y.o. F) Treating RN: Levora Dredge Primary Care Nyssa Sayegh: Josephine Cables Other Clinician: Referring Jewelianna Pancoast: Josephine Cables Treating Nattalie Santiesteban/Extender: Skipper Cliche in Treatment: 2 Vital Signs Height(in): 29 Pulse(bpm): 48 Weight(lbs): 163 Blood Pressure(mmHg): 100/67 Body Mass Index(BMI): 26.3 Temperature(F): Respiratory Rate(breaths/min): 18 Photos: [N/A:N/A] Wound Location: Midline Sacrum N/A N/A Wounding Event: Pressure Injury N/A N/A Primary Etiology: Pressure Ulcer N/A N/A Comorbid History: Cataracts, Anemia, Asthma, N/A N/A Hypertension, Type II Diabetes, Osteoarthritis,  Neuropathy Date Acquired: 11/28/2020 N/A N/A Weeks of Treatment: 2 N/A N/A Wound Status: Open N/A N/A Wound Recurrence: No N/A N/A Measurements L x W x D (cm) 1.2x0.5x2.1 N/A N/A Area (cm) : 0.471 N/A N/A Volume (cm) : 0.99 N/A N/A % Reduction in Area: -19.80% N/A N/A % Reduction in Volume: -739.00% N/A N/A Starting Position 1 (o'clock): 9 Ending Position 1 (o'clock): 3 Maximum Distance 1 (cm): 2.1 Undermining: Yes N/A N/A Classification: Category/Stage III N/A N/A Exudate Amount: Medium N/A N/A Exudate Type: Serosanguineous N/A N/A Exudate Color: red, brown N/A N/A Wound Margin: Distinct, outline attached  N/A N/A Granulation Amount: Large (67-100%) N/A N/A Granulation Quality: Red N/A N/A Necrotic Amount: Small (1-33%) N/A N/A Exposed Structures: Fat Layer (Subcutaneous Tissue): N/A N/A Yes Epithelialization: None N/A N/A Treatment Notes Wound #3 (Sacrum) Wound Laterality: Midline Cleanser Peri-Wound Care Topical Humphres, Shatonia P. ([id number]) Primary Dressing Prisma 4.34 (in) Discharge Instruction: pack wound with dry prisma using a q tip Secondary Dressing Gauze Discharge Instruction: As directed:, moistened with saline Secured With zetuvit silicone boarder Discharge Instruction: placed over wound Compression Wrap Compression Stockings Add-Ons Electronic Signature(s) Signed: 05/06/2021 8:31:34 AM By: Levora Dredge Previous Signature: 05/05/2021 5:12:29 PM Version By: Levora Dredge Entered By: Levora Dredge on 05/06/2021 08:31:34 Trickel, Ryliee P. ([id number]) -------------------------------------------------------------------------------- Multi-Disciplinary Care Plan Details Patient Name: Evers, Tezra P. Date of Service: 05/05/2021 3:00 PM Medical Record Number: [id number] Patient Account Number: [id number] Date of Birth/Sex: [birth year] (76 y.o. F) Treating RN: Levora Dredge Primary Care Floyed Masoud: Josephine Cables Other Clinician: Referring Nita Whitmire:  Josephine Cables Treating Heiley Shaikh/Extender: Skipper Cliche in Treatment: 2 Active Inactive Orientation to the Wound Care Program Nursing Diagnoses: Knowledge deficit related to the wound healing center program Goals: Patient/caregiver will verbalize understanding of the [facility] Date Initiated: 04/21/2021 Target Resolution Date: 04/21/2021 Goal Status: Active Interventions: Provide education on orientation to the wound center Notes: Pressure Nursing Diagnoses: Knowledge deficit related to causes and risk factors for pressure ulcer development Knowledge deficit related to management of pressures ulcers Potential for impaired tissue integrity related to pressure, friction, moisture, and shear Goals: Patient will remain free from development of additional pressure ulcers Date Initiated: 04/21/2021 Target Resolution Date: 04/21/2021 Goal Status: Active Patient will remain free of pressure ulcers Date Initiated: 04/21/2021 Target Resolution Date: 04/21/2021 Goal Status: Active Patient/caregiver will verbalize risk factors for pressure ulcer development Date Initiated: 04/21/2021 Target Resolution Date: 04/21/2021 Goal Status: Active Patient/caregiver will verbalize understanding of pressure ulcer management Date Initiated: 04/21/2021 Target Resolution Date: 04/21/2021 Goal Status: Active Interventions: Assess: immobility, friction, shearing, incontinence upon admission and as needed Assess offloading mechanisms upon admission and as needed Assess potential for pressure ulcer upon admission and as needed Provide education on pressure ulcers Notes: Wound/Skin Impairment Nursing Diagnoses: Impaired tissue integrity Knowledge deficit related to ulceration/compromised skin integrity Goals: Ulcer/skin breakdown will have a volume reduction of 30% by week 4 Date Initiated: 04/21/2021 Target Resolution Date: 05/19/2021 NAEOMI, MARMOLEJOS ([id number]) Goal Status:  Active Ulcer/skin breakdown will have a volume reduction of 50% by week 8 Date Initiated: 04/21/2021 Target Resolution Date: 06/16/2021 Goal Status: Active Ulcer/skin breakdown will have a volume reduction of 80% by week 12 Date Initiated: 04/21/2021 Target Resolution Date: 07/14/2021 Goal Status: Active Ulcer/skin breakdown will heal within 14 weeks Date Initiated: 04/21/2021 Target Resolution Date: 07/28/2021 Goal Status: Active Interventions: Assess patient/caregiver ability to obtain necessary supplies Assess patient/caregiver ability to perform ulcer/skin care regimen upon admission and as needed Assess ulceration(s) every visit Provide education on ulcer and skin care Notes: Electronic Signature(s) Signed: 05/06/2021 8:31:16 AM By: Levora Dredge Entered By: Levora Dredge on 05/06/2021 08:31:16 Kinne, Ashna P. ([id number]) -------------------------------------------------------------------------------- Pain Assessment Details Patient Name: Haeberle, Djuana P. Date of Service: 05/05/2021 3:00 PM Medical Record Number: [id number] Patient Account Number: [id number] Date of Birth/Sex: Nov 08, 1945 (76 y.o. F) Treating RN: Levora Dredge Primary Care Maudell Stanbrough: Josephine Cables Other Clinician: Referring Bristal Steffy: Josephine Cables Treating Laini Urick/Extender: Skipper Cliche in Treatment: 2 Active Problems Location of Pain Severity and Description of Pain Patient Has Paino Yes Site Locations Rate the pain. Current Pain Level: 3  Pain Management and Medication Current Pain Management: Notes pt states pain in bottom Electronic Signature(s) Signed: 05/05/2021 5:12:29 PM By: Levora Dredge Entered By: Levora Dredge on 05/05/2021 15:44:21 Ulatowski, Cheralyn P. ([id number]) -------------------------------------------------------------------------------- Patient/Caregiver Education Details Patient Name: Srinivasan, Sanita P. Date of Service: 05/05/2021 3:00 PM Medical Record Number:  [id number] Patient Account Number: [id number] Date of Birth/Gender: [birth year] (76 y.o. F) Treating RN: Levora Dredge Primary Care Physician: Josephine Cables Other Clinician: Referring Physician: Josephine Cables Treating Physician/Extender: Skipper Cliche in Treatment: 2 Education Assessment Education Provided To: Patient and Caregiver Education Topics Provided Pressure: Handouts: Preventing Pressure Ulcers Methods: Explain/Verbal Responses: State content correctly Wound/Skin Impairment: Handouts: Caring for Your Ulcer Methods: Explain/Verbal Responses: State content correctly Electronic Signature(s) Signed: 05/06/2021 4:32:25 PM By: Levora Dredge Entered By: Levora Dredge on 05/06/2021 08:33:02 Montesinos, Novie P. ([id number]) -------------------------------------------------------------------------------- Wound Assessment Details Patient Name: Coppinger, Iline P. Date of Service: 05/05/2021 3:00 PM Medical Record Number: [id number] Patient Account Number: [id number] Date of Birth/Sex: 1945/07/22 (76 y.o. F) Treating RN: Levora Dredge Primary Care Ronak Duquette: Josephine Cables Other Clinician: Referring Davis Ambrosini: Josephine Cables Treating Kevante Lunt/Extender: Jeri Cos Weeks in Treatment: 2 Wound Status Wound Number: 3 Primary Pressure Ulcer Etiology: Wound Location: Midline Sacrum Wound Open Wounding Event: Pressure Injury Status: Date Acquired: 11/28/2020 Comorbid Cataracts, Anemia, Asthma, Hypertension, Type II Weeks Of Treatment: 2 History: Diabetes, Osteoarthritis, Neuropathy Clustered Wound: No Photos Wound Measurements Length: (cm) 1.2 % Redu Width: (cm) 0.5 % Redu Depth: (cm) 2.1 Epithe Area: (cm) 0.471 Tunne Volume: (cm) 0.99 Under Sta End Max ction in Area: -19.8% ction in Volume: -739% lialization: None ling: No mining: Yes rting Position (o'clock): 9 ing Position (o'clock): 3 imum Distance: (cm) 2.1 Wound Description Classification:  Category/Stage III Foul O Wound Margin: Distinct, outline attached Slough Exudate Amount: Medium Exudate Type: Serosanguineous Exudate Color: red, brown dor After Cleansing: No /Fibrino No Wound Bed Granulation Amount: Large (67-100%) Exposed Structure Granulation Quality: Red Fat Layer (Subcutaneous Tissue) Exposed: Yes Necrotic Amount: Small (1-33%) Necrotic Quality: Adherent Slough Treatment Notes Wound #3 (Sacrum) Wound Laterality: Midline Cleanser Peri-Wound Care Goold, Imogen P. ([id number]) Topical Primary Dressing Prisma 4.34 (in) Discharge Instruction: pack wound with dry prisma using a q tip Secondary Dressing Gauze Discharge Instruction: As directed:, moistened with saline Secured With zetuvit silicone boarder Discharge Instruction: placed over wound Compression Wrap Compression Stockings Add-Ons Electronic Signature(s) Signed: 05/05/2021 5:12:29 PM By: Levora Dredge Entered By: Levora Dredge on 05/05/2021 15:50:05 Schaller, Everest P. ([id number]) -------------------------------------------------------------------------------- Vitals Details Patient Name: Giammona, Altha P. Date of Service: 05/05/2021 3:00 PM Medical Record Number: [id number] Patient Account Number: [id number] Date of Birth/Sex: June 24, 1945 (76 y.o. F) Treating RN: Levora Dredge Primary Care Hiro Vipond: Josephine Cables Other Clinician: Referring Lachrista Heslin: Josephine Cables Treating Kasem Mozer/Extender: Skipper Cliche in Treatment: 2 Vital Signs Time Taken: 15:35 Pulse (bpm): 88 Height (in): 66 Respiratory Rate (breaths/min): 18 Weight (lbs): 163 Blood Pressure (mmHg): 100/67 Body Mass Index (BMI): 26.3 Reference Range: 80 - 120 mg / dl Notes unable to obtain temp Electronic Signature(s) Signed: 05/05/2021 5:12:29 PM By: Levora Dredge Entered By: Levora Dredge on 05/05/2021 15:44:06

## 2021-05-15 ENCOUNTER — Other Ambulatory Visit: Payer: Self-pay | Admitting: Student

## 2021-05-15 DIAGNOSIS — Z1231 Encounter for screening mammogram for malignant neoplasm of breast: Secondary | ICD-10-CM

## 2021-05-15 DIAGNOSIS — Z13820 Encounter for screening for osteoporosis: Secondary | ICD-10-CM

## 2021-05-22 ENCOUNTER — Ambulatory Visit: Payer: Medicare HMO | Admitting: Physician Assistant

## 2021-05-27 ENCOUNTER — Encounter: Payer: Medicare HMO | Admitting: Physician Assistant

## 2021-05-27 ENCOUNTER — Other Ambulatory Visit: Payer: Self-pay

## 2021-05-27 DIAGNOSIS — L89153 Pressure ulcer of sacral region, stage 3: Secondary | ICD-10-CM | POA: Diagnosis not present

## 2021-05-27 NOTE — Progress Notes (Signed)
[name], [name] ([id number]) Visit Report for 05/27/2021 Arrival Information Details Patient Name: [name], [name] Date of Service: 05/27/2021 3:00 PM Medical Record Number: [id number] Patient Account Number: [id number] Date of Birth/Sex: 26-Jul-1945 (76 y.o. F) Treating RN: Angelina Pih Primary Care Spyridon Hornstein: Ethelda Chick Other Clinician: Referring Daylee Delahoz: Ethelda Chick Treating Swayze Kozuch/Extender: Rowan Blase in Treatment: 5 Visit Information History Since Last Visit Added or deleted any medications: No Patient Arrived: Wheel Chair Any new allergies or adverse reactions: No Arrival Time: 15:20 Had a fall or experienced change in No Accompanied By: husband activities of daily living that may affect Transfer Assistance: Michiel Sites Lift risk of falls: Patient Identification Verified: Yes Hospitalized since last visit: No Secondary Verification Process Completed: Yes Has Dressing in Place as Prescribed: Yes Patient Has Alerts: Yes Pain Present Now: No Patient Alerts: Patient on Blood Thinner Electronic Signature(s) Signed: 05/27/2021 4:37:03 PM By: Angelina Pih Entered By: Angelina Pih on 05/27/2021 15:38:19 [name], [name] -------------------------------------------------------------------------------- Clinic Level of Care Assessment Details Patient Name: [name], [name] Date of Service: 05/27/2021 3:00 PM Medical Record Number: [id number] Patient Account Number: [id number] Date of Birth/Sex: Jun 21, 1945 (76 y.o. F) Treating RN: Angelina Pih Primary Care Ondre Salvetti: Ethelda Chick Other Clinician: Referring Gerrett Loman: Ethelda Chick Treating Innocence Schlotzhauer/Extender: Rowan Blase in Treatment: 5 Clinic Level of Care Assessment Items TOOL 4 Quantity Score []  - Use when only an EandM is performed on FOLLOW-UP visit 0 ASSESSMENTS - Nursing Assessment / Reassessment []  - Reassessment of Co-morbidities (includes updates in patient status)  0 X- 1 5 Reassessment of Adherence to Treatment Plan ASSESSMENTS - Wound and Skin Assessment / Reassessment X - Simple Wound Assessment / Reassessment - one wound 1 5 []  - 0 Complex Wound Assessment / Reassessment - multiple wounds []  - 0 Dermatologic / Skin Assessment (not related to wound area) ASSESSMENTS - Focused Assessment []  - Circumferential Edema Measurements - multi extremities 0 []  - 0 Nutritional Assessment / Counseling / Intervention []  - 0 Lower Extremity Assessment (monofilament, tuning fork, pulses) []  - 0 Peripheral Arterial Disease Assessment (using hand held doppler) ASSESSMENTS - Ostomy and/or Continence Assessment and Care []  - Incontinence Assessment and Management 0 []  - 0 Ostomy Care Assessment and Management (repouching, etc.) PROCESS - Coordination of Care X - Simple Patient / Family Education for ongoing care 1 15 []  - 0 Complex (extensive) Patient / Family Education for ongoing care []  - 0 Staff obtains , Records, Test Results / Process Orders []  - 0 Staff telephones HHA, Nursing Homes / Clarify orders / etc []  - 0 Routine Transfer to another Facility (non-emergent condition) []  - 0 Routine Hospital Admission (non-emergent condition) []  - 0 New Admissions / / Ordering NPWT, Apligraf, etc. []  - 0 Emergency Hospital Admission (emergent condition) X- 1 10 Simple Discharge Coordination []  - 0 Complex (extensive) Discharge Coordination PROCESS - Special Needs []  - Pediatric / Minor Patient Management 0 []  - 0 Isolation Patient Management []  - 0 Hearing / Language / Visual special needs []  - 0 Assessment of Community assistance (transportation, D/C planning, etc.) []  - 0 Additional assistance / Altered mentation []  - 0 Support Surface(s) Assessment (bed, cushion, seat, etc.) INTERVENTIONS - Wound Cleansing / Measurement [name], [name] ( ) X- 1 5 Simple Wound Cleansing - one wound []  - 0 Complex  Wound Cleansing - multiple wounds X- 1 5 Wound Imaging (photographs - any number of wounds) []  - 0 Wound Tracing (instead of photographs) X- 1 5 Simple Wound Measurement -  one wound []  - 0 Complex Wound Measurement - multiple wounds INTERVENTIONS - Wound Dressings X - Small Wound Dressing one or multiple wounds 1 10 []  - 0 Medium Wound Dressing one or multiple wounds []  - 0 Large Wound Dressing one or multiple wounds X- 1 5 Application of Medications - topical []  - 0 Application of Medications - injection INTERVENTIONS - Miscellaneous []  - External ear exam 0 []  - 0 Specimen Collection (cultures, biopsies, blood, body fluids, etc.) []  - 0 Specimen(s) / Culture(s) sent or taken to Lab for analysis []  - 0 Patient Transfer (multiple staff / Nurse, adultHoyer Lift / Similar devices) []  - 0 Simple Staple / Suture removal (25 or less) []  - 0 Complex Staple / Suture removal (26 or more) []  - 0 Hypo / Hyperglycemic Management (close monitor of Blood Glucose) []  - 0 Ankle / Brachial Index (ABI) - do not check if billed separately X- 1 5 Vital Signs Has the patient been seen at the hospital within the last three years: Yes Total Score: 70 Level Of Care: New/Established - Level 2 Electronic Signature(s) Signed: 05/27/2021 4:37:03 PM By: Angelina PihGordon, Caitlin Entered By: Angelina PihGordon, Caitlin on 05/27/2021 16:21:23 [name], [name] -------------------------------------------------------------------------------- Encounter Discharge Information Details Patient Name: [name], [name] Date of Service: 05/27/2021 3:00 PM Medical Record Number: [id number] Patient Account Number: [id number] Date of Birth/Sex: [birth year] (76 y.o. F) Treating RN: Angelina PihGordon, Caitlin Primary Care Houa Ackert: Ethelda Chickoberts, Caroline Other Clinician: Referring Sanvi Ehler: Ethelda Chickoberts, Caroline Treating Sapphira Harjo/Extender: Rowan BlaseStone, Hoyt Weeks in Treatment: 5 Encounter Discharge Information Items Discharge Condition: Stable Ambulatory  Status: Wheelchair Discharge Destination: Home Transportation: Private Auto Accompanied By: husband Schedule Follow-up Appointment: Yes Clinical Summary of Care: Electronic Signature(s) Signed: 05/27/2021 4:22:32 PM By: Angelina PihGordon, Caitlin Entered By: Angelina PihGordon, Caitlin on 05/27/2021 16:22:32 [name], [name] ([id number]) -------------------------------------------------------------------------------- Lower Extremity Assessment Details Patient Name: [name], [name] Date of Service: 05/27/2021 3:00 PM Medical Record Number: [id number] Patient Account Number: [id number] Date of Birth/Sex: [birth year] (76 y.o. F) Treating RN: Angelina PihGordon, Caitlin Primary Care Nashia Remus: Ethelda Chickoberts, Caroline Other Clinician: Referring Wilhemenia Camba: Ethelda Chickoberts, Caroline Treating Mickey Esguerra/Extender: Allen DerryStone, Hoyt Weeks in Treatment: 5 Electronic Signature(s) Signed: 05/27/2021 4:37:03 PM By: Angelina PihGordon, Caitlin Entered By: Angelina PihGordon, Caitlin on 05/27/2021 15:41:30 [name], [name] ([id number]) -------------------------------------------------------------------------------- Multi Wound Chart Details Patient Name: [name], [name] Date of Service: 05/27/2021 3:00 PM Medical Record Number: [id number] Patient Account Number: [id number] Date of Birth/Sex: [birth year] (76 y.o. F) Treating RN: Angelina PihGordon, Caitlin Primary Care Roslyn Else: Ethelda Chickoberts, Caroline Other Clinician: Referring Marigny Borre: Ethelda Chickoberts, Caroline Treating Lenisha Lacap/Extender: Rowan BlaseStone, Hoyt Weeks in Treatment: 5 Vital Signs Height(in): 66 Pulse(bpm): 97 Weight(lbs): 163 Blood Pressure(mmHg): 90/56 Body Mass Index(BMI): 26.3 Temperature(F): 98.1 Respiratory Rate(breaths/min): 18 Photos: [N/A:N/A] Wound Location: Midline Sacrum N/A N/A Wounding Event: Pressure Injury N/A N/A Primary Etiology: Pressure Ulcer N/A N/A Comorbid History: Cataracts, Anemia, Asthma, N/A N/A Hypertension, Type II Diabetes, Osteoarthritis, Neuropathy Date Acquired: 11/28/2020 N/A N/A Weeks of Treatment: 5  N/A N/A Wound Status: Open N/A N/A Wound Recurrence: No N/A N/A Measurements L x W x D (cm) 1x0.5x0.4 N/A N/A Area (cm) : 0.393 N/A N/A Volume (cm) : 0.157 N/A N/A % Reduction in Area: 0.00% N/A N/A % Reduction in Volume: -33.10% N/A N/A Starting Position 1 (o'clock): 12 Ending Position 1 (o'clock): 3 Maximum Distance 1 (cm): 2.2 Undermining: Yes N/A N/A Classification: Category/Stage III N/A N/A Exudate Amount: Medium N/A N/A Exudate Type: Serosanguineous N/A N/A Exudate Color: red, brown N/A N/A Wound Margin: Distinct, outline attached N/A N/A Granulation Amount: Large (67-100%) N/A N/A  Granulation Quality: Red N/A N/A Necrotic Amount: Small (1-33%) N/A N/A Exposed Structures: Fat Layer (Subcutaneous Tissue): N/A N/A Yes Epithelialization: None N/A N/A Treatment Notes Electronic Signature(s) Signed: 05/27/2021 4:37:03 PM By: Angelina Pih Entered By: Angelina Pih on 05/27/2021 15:49:28 [name], [name] ([id number]) -------------------------------------------------------------------------------- Multi-Disciplinary Care Plan Details Patient Name: [name], [name] Date of Service: 05/27/2021 3:00 PM Medical Record Number: [id number] Patient Account Number: [id number] Date of Birth/Sex: 1945-04-07 (76 y.o. F) Treating RN: Angelina Pih Primary Care Preeti Winegardner: Ethelda Chick Other Clinician: Referring Sherard Sutch: Ethelda Chick Treating Borna Wessinger/Extender: Rowan Blase in Treatment: 5 Active Inactive Orientation to the Wound Care Program Nursing Diagnoses: Knowledge deficit related to the wound healing center program Goals: Patient/caregiver will verbalize understanding of the Wound Healing Center Program Date Initiated: 04/21/2021 Target Resolution Date: 04/21/2021 Goal Status: Active Interventions: Provide education on orientation to the wound center Notes: Pressure Nursing Diagnoses: Knowledge deficit related to causes and risk factors for pressure  ulcer development Knowledge deficit related to management of pressures ulcers Potential for impaired tissue integrity related to pressure, friction, moisture, and shear Goals: Patient will remain free from development of additional pressure ulcers Date Initiated: 04/21/2021 Target Resolution Date: 04/21/2021 Goal Status: Active Patient will remain free of pressure ulcers Date Initiated: 04/21/2021 Target Resolution Date: 04/21/2021 Goal Status: Active Patient/caregiver will verbalize risk factors for pressure ulcer development Date Initiated: 04/21/2021 Target Resolution Date: 04/21/2021 Goal Status: Active Patient/caregiver will verbalize understanding of pressure ulcer management Date Initiated: 04/21/2021 Target Resolution Date: 04/21/2021 Goal Status: Active Interventions: Assess: immobility, friction, shearing, incontinence upon admission and as needed Assess offloading mechanisms upon admission and as needed Assess potential for pressure ulcer upon admission and as needed Provide education on pressure ulcers Notes: Wound/Skin Impairment Nursing Diagnoses: Impaired tissue integrity Knowledge deficit related to ulceration/compromised skin integrity Goals: Ulcer/skin breakdown will have a volume reduction of 30% by week 4 Date Initiated: 04/21/2021 Target Resolution Date: 05/19/2021 OREAN, DARIS ([id number]) Goal Status: Active Ulcer/skin breakdown will have a volume reduction of 50% by week 8 Date Initiated: 04/21/2021 Target Resolution Date: 06/16/2021 Goal Status: Active Ulcer/skin breakdown will have a volume reduction of 80% by week 12 Date Initiated: 04/21/2021 Target Resolution Date: 07/14/2021 Goal Status: Active Ulcer/skin breakdown will heal within 14 weeks Date Initiated: 04/21/2021 Target Resolution Date: 07/28/2021 Goal Status: Active Interventions: Assess patient/caregiver ability to obtain necessary supplies Assess patient/caregiver ability to perform ulcer/skin  care regimen upon admission and as needed Assess ulceration(s) every visit Provide education on ulcer and skin care Notes: Electronic Signature(s) Signed: 05/27/2021 4:37:03 PM By: Angelina Pih Entered By: Angelina Pih on 05/27/2021 15:49:16 [name], [name] ([id number]) -------------------------------------------------------------------------------- Pain Assessment Details Patient Name: Crudup, Shaolin P. Date of Service: 05/27/2021 3:00 PM Medical Record Number: [id number] Patient Account Number: [id number] Date of Birth/Sex: 1945-11-13 (76 y.o. F) Treating RN: Angelina Pih Primary Care Stephaney Steven: Ethelda Chick Other Clinician: Referring Korin Hartwell: Ethelda Chick Treating Kewana Sanon/Extender: Rowan Blase in Treatment: 5 Active Problems Location of Pain Severity and Description of Pain Patient Has Paino No Site Locations Rate the pain. Current Pain Level: 0 Pain Management and Medication Current Pain Management: Electronic Signature(s) Signed: 05/27/2021 4:37:03 PM By: Angelina Pih Entered By: Angelina Pih on 05/27/2021 15:38:53 Brumm, Chaz P. ([id number]) -------------------------------------------------------------------------------- Patient/Caregiver Education Details Patient Name: Schmutz, Yvaine P. Date of Service: 05/27/2021 3:00 PM Medical Record Number: [id number] Patient Account Number: [id number] Date of Birth/Gender: 1946/03/18 (76 y.o. F) Treating RN: Angelina Pih Primary Care Physician: Ethelda Chick Other Clinician: Referring Physician: Ethelda Chick  Treating Physician/Extender: Rowan Blase in Treatment: 5 Education Assessment Education Provided To: Patient and Caregiver Education Topics Provided Wound/Skin Impairment: Handouts: Caring for Your Ulcer Methods: Explain/Verbal Responses: State content correctly Electronic Signature(s) Signed: 05/27/2021 4:37:03 PM By: Angelina Pih Entered By: Angelina Pih on  05/27/2021 16:21:49 Burck, Amanpreet P. ([id number]) -------------------------------------------------------------------------------- Wound Assessment Details Patient Name: Besecker, Aquanetta P. Date of Service: 05/27/2021 3:00 PM Medical Record Number: [id number] Patient Account Number: [id number] Date of Birth/Sex: 1945-07-06 (76 y.o. F) Treating RN: Angelina Pih Primary Care Rulon Abdalla: Ethelda Chick Other Clinician: Referring Hong Moring: Ethelda Chick Treating Dorenda Pfannenstiel/Extender: Allen Derry Weeks in Treatment: 5 Wound Status Wound Number: 3 Primary Pressure Ulcer Etiology: Wound Location: Midline Sacrum Wound Open Wounding Event: Pressure Injury Status: Date Acquired: 11/28/2020 Comorbid Cataracts, Anemia, Asthma, Hypertension, Type II Weeks Of Treatment: 5 History: Diabetes, Osteoarthritis, Neuropathy Clustered Wound: No Photos Wound Measurements Length: (cm) 1 % Redu Width: (cm) 0.5 % Redu Depth: (cm) 0.4 Epithe Area: (cm) 0.393 Tunne Volume: (cm) 0.157 Under Sta End Max ction in Area: 0% ction in Volume: -33.1% lialization: None ling: No mining: Yes rting Position (o'clock): 12 ing Position (o'clock): 3 imum Distance: (cm) 2.2 Wound Description Classification: Category/Stage III Foul Wound Margin: Distinct, outline attached Sloug Exudate Amount: Medium Exudate Type: Serosanguineous Exudate Color: red, brown Odor After Cleansing: No h/Fibrino No Wound Bed Granulation Amount: Large (67-100%) Exposed Structure Granulation Quality: Red Fat Layer (Subcutaneous Tissue) Exposed: Yes Necrotic Amount: Small (1-33%) Necrotic Quality: Adherent Slough Treatment Notes Wound #3 (Sacrum) Wound Laterality: Midline Cleanser Peri-Wound Care Russi, Kaytelynn P. ([id number]) Topical Primary Dressing Prisma 4.34 (in) Discharge Instruction: pack wound with dry prisma using a q tip Secondary Dressing Gauze Discharge Instruction: As directed:, moistened with  saline Secured With zetuvit silicone boarder Discharge Instruction: placed over wound Compression Wrap Compression Stockings Add-Ons Electronic Signature(s) Signed: 05/27/2021 4:37:03 PM By: Angelina Pih Entered By: Angelina Pih on 05/27/2021 15:48:28 Shear, Anneliese P. ([id number]) -------------------------------------------------------------------------------- Vitals Details Patient Name: Divita, Yalexa P. Date of Service: 05/27/2021 3:00 PM Medical Record Number: [id number] Patient Account Number: [id number] Date of Birth/Sex: [birth year] (76 y.o. F) Treating RN: Angelina Pih Primary Care Ryelle Ruvalcaba: Ethelda Chick Other Clinician: Referring Purva Vessell: Ethelda Chick Treating Sulamita Lafountain/Extender: Rowan Blase in Treatment: 5 Vital Signs Time Taken: 15:35 Temperature (F): 98.1 Height (in): 66 Pulse (bpm): 97 Weight (lbs): 163 Respiratory Rate (breaths/min): 18 Body Mass Index (BMI): 26.3 Blood Pressure (mmHg): 90/56 Reference Range: 80 - 120 mg / dl Electronic Signature(s) Signed: 05/27/2021 4:37:03 PM By: Angelina Pih Entered By: Angelina Pih on 05/27/2021 15:38:43

## 2021-05-27 NOTE — Progress Notes (Addendum)
Molly [name] ([id number]) Visit Report for 05/27/2021 Chief Complaint Document Details Patient Name: Molly [name], [name] Date of Service: 05/27/2021 3:00 PM Medical Record Number: [id number] Patient Account Number: [id number] Date of Birth/Sex: [birth year] (76 y.o. F) Treating RN: Levora Dredge Primary Care Provider: Josephine Cables Other Clinician: Referring Provider: Josephine Cables Treating Provider/Extender: Skipper Cliche in Treatment: 5 Information Obtained from: Patient Chief Complaint Sacral Pressure Ulcer Stage 3 Electronic Signature(s) Signed: 05/27/2021 3:44:07 PM By: Worthy Keeler PA-C Entered By: Worthy Keeler on 05/27/2021 15:44:07 Molly [name], [name] ([id number]) -------------------------------------------------------------------------------- HPI Details Patient Name: Molly [name], [name] Date of Service: 05/27/2021 3:00 PM Medical Record Number: [id number] Patient Account Number: [id number] Date of Birth/Sex: 29-Mar-1946 (76 y.o. F) Treating RN: Levora Dredge Primary Care Provider: Josephine Cables Other Clinician: Referring Provider: Josephine Cables Treating Provider/Extender: Skipper Cliche in Treatment: 5 History of Present Illness HPI Description: Readmission: 04/21/2021 upon evaluation today patient presents for reevaluation here in the clinic those been since 2016 since she was last here. I have cared for her husband more recently. With that being said she tells me currently that she is having a lot of discomfort with her wound. She does have some dementia issues. At one point she actually told the nurse that she goes horseback riding regularly. Nonetheless she is generally weak and not really able to even sit or stand or walk so that obviously is not the case. Either way the biggest issue here is that she has a sacral wound. She has a gel overlay for her hospital bed she generally lays on her side so I think that she is not really getting pressure  too much there. The only time she is really in her chair is when she is coming for doctor's appointment. With all that being said I do believe that the patient is experiencing some issues here with increased pain from this wound but I do not see any signs of active infection at this time. She has a history of diabetes mellitus type 2 although this seems to be under pretty good control he checks her blood sugar that is her husband occasionally gives insulin as needed but again he tells me he has not had to for some time. She also stated to be hypertensive though her blood pressure was good today and it does not appear that she is on any specific medications currently. A wound VAC has not been attempted at this point. 05/05/2021 upon evaluation today patient appears to be doing well with regard to her wound. I do feel like we are showing some signs of improvement here which is great news. Fortunately I do not see any evidence of active infection locally or systemically at this point which is also excellent news. No fevers, chills, nausea, vomiting, or diarrhea. 05/27/2021 upon evaluation today patient unfortunately continues to have issues here with her sacral wound. Fortunately I do not see any signs of infection which is good news but nonetheless this really has not been packed with the collagen the whole time like it supposed to be due to the fact that I did not get the supply order. This should be coming from [facility] although it sounds like they have been waiting for some time to get their order in unfortunately. The patient does not have any signs of systemic or local infection which is good news but definitely I think the collagen packed into the undermining could help to get this to heal much more effectively and quickly. Electronic  Signature(s) Signed: 05/27/2021 4:21:56 PM By: Worthy Keeler PA-C Entered By: Worthy Keeler on 05/27/2021 16:21:55 Molly [name], [name]  ([id number]) -------------------------------------------------------------------------------- Physical Exam Details Patient Name: [name], [name] Date of Service: 05/27/2021 3:00 PM Medical Record Number: [id number] Patient Account Number: [id number] Date of Birth/Sex: [birth year] (76 y.o. F) Treating RN: Levora Dredge Primary Care Provider: Josephine Cables Other Clinician: Referring Provider: Josephine Cables Treating Provider/Extender: Skipper Cliche in Treatment: 5 Constitutional Well-nourished and well-hydrated in no acute distress. Respiratory normal breathing without difficulty. Psychiatric this patient is able to make decisions and demonstrates good insight into disease process. Alert and Oriented x 3. pleasant and cooperative. Notes Upon inspection patient's wound bed actually showed signs of significant undermining although the actual opening is very small. I think that she is making progress but is very slow I think with appropriate packing with the collagen this could definitely be much more effective and helpful for her. Electronic Signature(s) Signed: 05/27/2021 4:22:18 PM By: Worthy Keeler PA-C Entered By: Worthy Keeler on 05/27/2021 16:22:18 Molly [name], Molly [name] ([id number]) -------------------------------------------------------------------------------- Physician Orders Details Patient Name: [name], [name] Date of Service: 05/27/2021 3:00 PM Medical Record Number: [id number] Patient Account Number: [id number] Date of Birth/Sex: Jan 31, 1946 (76 y.o. F) Treating RN: Levora Dredge Primary Care Provider: Josephine Cables Other Clinician: Referring Provider: Josephine Cables Treating Provider/Extender: Skipper Cliche in Treatment: 5 Verbal / Phone Orders: No Diagnosis Coding ICD-10 Coding Code Description L89.153 Pressure ulcer of sacral region, stage 3 E11.622 Type 2 diabetes mellitus with other skin ulcer M62.81 Muscle weakness (generalized) I10  Essential (primary) hypertension F01.A0 Vascular dementia, mild, without behavioral disturbance, psychotic disturbance, mood disturbance, and anxiety Follow-up Appointments o Return Appointment in 2 weeks. o Nurse Visit as needed [place] for wound care. May utilize formulary equivalent dressing for wound treatment orders unless otherwise specified. Home Health Nurse may visit [facility] to address patientos wound care needs. o Scheduled days for dressing changes to be completed; exception, patient has scheduled wound care visit that day. o **Please direct any NON-WOUND related issues/requests for orders to patient's Primary Care Physician. **If current dressing causes regression in wound condition, may D/C ordered dressing product/s and apply Normal Saline Moist Dressing daily until next [place] or Other MD appointment. **Notify [facility] of regression in wound condition at [phone]. Bathing/ Shower/ Hygiene o Wash wounds with antibacterial soap and water. o May shower; gently cleanse wound with antibacterial soap, rinse and pat dry prior to dressing wounds o No tub bath. Off-Loading o Turn and reposition every 2 hours Wound Treatment Wound #3 - Sacrum Wound Laterality: Midline Primary Dressing: Prisma 4.34 (in) (Generic) 3 x Per Week/30 Days Discharge Instructions: pack wound with dry prisma using a q tip Secondary Dressing: Gauze 3 x Per Week/30 Days Discharge Instructions: As directed:, moistened with saline Secured With: zetuvit silicone boarder 3 x Per Week/30 Days Discharge Instructions: placed over wound Electronic Signature(s) Signed: 05/27/2021 4:37:03 PM By: Levora Dredge Signed: 05/27/2021 4:37:47 PM By: Worthy Keeler PA-C Entered By: Levora Dredge on 05/27/2021 16:12:53 Molly [name], [name]  ([id number]) -------------------------------------------------------------------------------- Problem List Details Patient Name: Molly [name], [name] Date of Service: 05/27/2021 3:00 PM Medical Record Number: [id number] Patient Account Number: [id number] Date of Birth/Sex: 11/05/1945 (76 y.o. F) Treating RN: Levora Dredge Primary Care Provider: Josephine Cables Other Clinician: Referring Provider: Josephine Cables Treating Provider/Extender: Skipper Cliche in Treatment: 5 Active Problems ICD-10 Encounter Code Description Active  Date MDM Diagnosis L89.153 Pressure ulcer of sacral region, stage 3 04/21/2021 No Yes E11.622 Type 2 diabetes mellitus with other skin ulcer 04/21/2021 No Yes M62.81 Muscle weakness (generalized) 04/21/2021 No Yes I10 Essential (primary) hypertension 04/21/2021 No Yes F01.A0 Vascular dementia, mild, without behavioral disturbance, psychotic 04/21/2021 No Yes disturbance, mood disturbance, and anxiety Inactive Problems Resolved Problems Electronic Signature(s) Signed: 05/27/2021 3:44:02 PM By: Worthy Keeler PA-C Entered By: Worthy Keeler on 05/27/2021 15:44:02 Molly [name], [name] ([id number]) -------------------------------------------------------------------------------- Progress Note Details Patient Name: Molly [name], [name] Date of Service: 05/27/2021 3:00 PM Medical Record Number: [id number] Patient Account Number: [id number] Date of Birth/Sex: 1946-03-26 (76 y.o. F) Treating RN: Levora Dredge Primary Care Provider: Josephine Cables Other Clinician: Referring Provider: Josephine Cables Treating Provider/Extender: Skipper Cliche in Treatment: 5 Subjective Chief Complaint Information obtained from Patient Sacral Pressure Ulcer Stage 3 History of Present Illness (HPI) Readmission: 04/21/2021 upon evaluation today patient presents for reevaluation here in the clinic those been since 2016 since she was last here. I have cared for her husband more  recently. With that being said she tells me currently that she is having a lot of discomfort with her wound. She does have some dementia issues. At one point she actually told the nurse that she goes horseback riding regularly. Nonetheless she is generally weak and not really able to even sit or stand or walk so that obviously is not the case. Either way the biggest issue here is that she has a sacral wound. She has a gel overlay for her hospital bed she generally lays on her side so I think that she is not really getting pressure too much there. The only time she is really in her chair is when she is coming for doctor's appointment. With all that being said I do believe that the patient is experiencing some issues here with increased pain from this wound but I do not see any signs of active infection at this time. She has a history of diabetes mellitus type 2 although this seems to be under pretty good control he checks her blood sugar that is her husband occasionally gives insulin as needed but again he tells me he has not had to for some time. She also stated to be hypertensive though her blood pressure was good today and it does not appear that she is on any specific medications currently. A wound VAC has not been attempted at this point. 05/05/2021 upon evaluation today patient appears to be doing well with regard to her wound. I do feel like we are showing some signs of improvement here which is great news. Fortunately I do not see any evidence of active infection locally or systemically at this point which is also excellent news. No fevers, chills, nausea, vomiting, or diarrhea. 05/27/2021 upon evaluation today patient unfortunately continues to have issues here with her sacral wound. Fortunately I do not see any signs of infection which is good news but nonetheless this really has not been packed with the collagen the whole time like it supposed to be due to the fact that I did not get the supply  order. This should be coming from [facility] although it sounds like they have been waiting for some time to get their order in unfortunately. The patient does not have any signs of systemic or local infection which is good news but definitely I think the collagen packed into the undermining could help to get this to heal much more effectively and quickly.  Objective Constitutional Well-nourished and well-hydrated in no acute distress. Vitals Time Taken: 3:35 PM, Height: 66 in, Weight: 163 lbs, BMI: 26.3, Temperature: 98.1 F, Pulse: 97 bpm, Respiratory Rate: 18 breaths/min, Blood Pressure: 90/56 mmHg. Respiratory normal breathing without difficulty. Psychiatric this patient is able to make decisions and demonstrates good insight into disease process. Alert and Oriented x 3. pleasant and cooperative. General Notes: Upon inspection patient's wound bed actually showed signs of significant undermining although the actual opening is very small. I think that she is making progress but is very slow I think with appropriate packing with the collagen this could definitely be much more effective and helpful for her. Integumentary (Hair, Skin) Wound #3 status is Open. Original cause of wound was Pressure Injury. The date acquired was: 11/28/2020. The wound has been in treatment 5 weeks. The wound is located on the Midline Sacrum. The wound measures 1cm length x 0.5cm width x 0.4cm depth; 0.393cm^2 area and 0.157cm^3 volume. There is Fat Layer (Subcutaneous Tissue) exposed. There is no tunneling noted, however, there is undermining starting at 12:00 and ending at 3:00 with a maximum distance of 2.2cm. There is a medium amount of serosanguineous drainage noted. The wound margin Molly [name], [name] ([id number]) is distinct with the outline attached to the wound base. There is large (67-100%) red granulation within the wound bed. There is a small (1-33%) amount of necrotic tissue within the wound bed including  Adherent Slough. Assessment Active Problems ICD-10 Pressure ulcer of sacral region, stage 3 Type 2 diabetes mellitus with other skin ulcer Muscle weakness (generalized) Essential (primary) hypertension Vascular dementia, mild, without behavioral disturbance, psychotic disturbance, mood disturbance, and anxiety Plan Follow-up Appointments: Return Appointment in 2 weeks. Nurse Visit as needed Home Health: [place]: [facility] for wound care. May utilize formulary equivalent dressing for wound treatment orders unless otherwise specified. Home Health Nurse may visit [facility] to address patient s wound care needs. Scheduled days for dressing changes to be completed; exception, patient has scheduled wound care visit that day. **Please direct any NON-WOUND related issues/requests for orders to patient's Primary Care Physician. **If current dressing causes regression in wound condition, may D/C ordered dressing product/s and apply Normal Saline Moist Dressing daily until next [place] or Other MD appointment. **Notify [facility] of regression in wound condition at [phone]. Bathing/ Shower/ Hygiene: Wash wounds with antibacterial soap and water. May shower; gently cleanse wound with antibacterial soap, rinse and pat dry prior to dressing wounds No tub bath. Off-Loading: Turn and reposition every 2 hours WOUND #3: - Sacrum Wound Laterality: Midline Primary Dressing: Prisma 4.34 (in) (Generic) 3 x Per Week/30 Days Discharge Instructions: pack wound with dry prisma using a q tip Secondary Dressing: Gauze 3 x Per Week/30 Days Discharge Instructions: As directed:, moistened with saline Secured With: zetuvit silicone boarder 3 x Per Week/30 Days Discharge Instructions: placed over wound 1. Would recommend currently that we going continue with the wound care measures as before and the patient is in agreement with plan. This includes the use of the silver  collagen packed into the wound including undermining. 2. I am also going to recommend that we have the patient continue with the Zetuvit bordered foam dressing to cover. 3. I am also can recommend at this time that we have the patient continue to appropriately offload I think this is still an ideal and big deal thing to do and she voiced understanding. We will see patient back for reevaluation in  1 week here in the clinic. If anything worsens or changes patient will contact our office for additional recommendations. Electronic Signature(s) Signed: 05/27/2021 4:23:18 PM By: Worthy Keeler PA-C Entered By: Worthy Keeler on 05/27/2021 16:23:17 Molly [name], [name] ([id number]) -------------------------------------------------------------------------------- SuperBill Details Patient Name: Molly [name], [name] Date of Service: 05/27/2021 Medical Record Number: [id number] Patient Account Number: [id number] Date of Birth/Sex: June 08, 1945 (76 y.o. F) Treating RN: Levora Dredge Primary Care Provider: Josephine Cables Other Clinician: Referring Provider: Josephine Cables Treating Provider/Extender: Skipper Cliche in Treatment: 5 Diagnosis Coding ICD-10 Codes Code Description [id number] Pressure ulcer of sacral region, stage 3 E11.622 Type 2 diabetes mellitus with other skin ulcer M62.81 Muscle weakness (generalized) I10 Essential (primary) hypertension F01.A0 Vascular dementia, mild, without behavioral disturbance, psychotic disturbance, mood disturbance, and anxiety Facility Procedures CPT4 Code: [id number] Description: [id number] - WOUND CARE VISIT-LEV 2 EST PT Modifier: Quantity: 1 Physician Procedures CPT4 Code: [id number] Description: [id number] - WC PHYS LEVEL 4 - EST PT Modifier: Quantity: 1 CPT4 Code: Description: ICD-10 Diagnosis Description L89.153 Pressure ulcer of sacral region, stage 3 E11.622 Type 2 diabetes mellitus with other skin ulcer M62.81 Muscle weakness (generalized) I10 Essential  (primary) hypertension Modifier: Quantity: Electronic Signature(s) Signed: 05/27/2021 4:23:34 PM By: Worthy Keeler PA-C Previous Signature: 05/27/2021 4:21:39 PM Version By: Levora Dredge Entered By: Worthy Keeler on 05/27/2021 16:23:33

## 2021-06-12 ENCOUNTER — Ambulatory Visit: Payer: Medicare HMO | Admitting: Physician Assistant

## 2021-06-24 ENCOUNTER — Encounter: Payer: Medicare HMO | Attending: Physician Assistant | Admitting: Physician Assistant

## 2021-06-24 ENCOUNTER — Other Ambulatory Visit: Payer: Self-pay

## 2021-06-24 DIAGNOSIS — F0390 Unspecified dementia without behavioral disturbance: Secondary | ICD-10-CM | POA: Diagnosis not present

## 2021-06-24 DIAGNOSIS — L89153 Pressure ulcer of sacral region, stage 3: Secondary | ICD-10-CM | POA: Insufficient documentation

## 2021-06-24 DIAGNOSIS — E1151 Type 2 diabetes mellitus with diabetic peripheral angiopathy without gangrene: Secondary | ICD-10-CM | POA: Insufficient documentation

## 2021-06-24 DIAGNOSIS — M1990 Unspecified osteoarthritis, unspecified site: Secondary | ICD-10-CM | POA: Insufficient documentation

## 2021-06-24 DIAGNOSIS — I10 Essential (primary) hypertension: Secondary | ICD-10-CM | POA: Diagnosis not present

## 2021-06-24 DIAGNOSIS — E1140 Type 2 diabetes mellitus with diabetic neuropathy, unspecified: Secondary | ICD-10-CM | POA: Diagnosis not present

## 2021-06-24 DIAGNOSIS — E11622 Type 2 diabetes mellitus with other skin ulcer: Secondary | ICD-10-CM | POA: Diagnosis present

## 2021-06-24 NOTE — Progress Notes (Addendum)
Andreatta, Avionna P. ([id number]) ?Visit Report for 06/24/2021 ?Chief Complaint Document Details ?Patient Name: [name], [name] ?Date of Service: 06/24/2021 3:00 PM ?Medical Record Number: [id number] ?Patient Account Number: [id number] ?Date of Birth/Sex: [birth year] (76 y.o. F) ?Treating RN: Angelina Pih ?Primary Care Provider: Ethelda Chick Other Clinician: ?Referring Provider: Ethelda Chick ?Treating Provider/Extender: Allen Derry ?Weeks in Treatment: 9 ?Information Obtained from: Patient ?Chief Complaint ?Sacral Pressure Ulcer Stage 3 ?Electronic Signature(s) ?Signed: 06/24/2021 2:41:27 PM By: Lenda Kelp PA-C ?Entered By: Lenda Kelp on 06/24/2021 14:41:26 ?Barcelo, Reighlynn P. ([id number]) ?-------------------------------------------------------------------------------- ?HPI Details ?Patient Name: [name], [name] ?Date of Service: 06/24/2021 3:00 PM ?Medical Record Number: [id number] ?Patient Account Number: [id number] ?Date of Birth/Sex: [birth year] (76 y.o. F) ?Treating RN: Angelina Pih ?Primary Care Provider: Ethelda Chick Other Clinician: ?Referring Provider: Ethelda Chick ?Treating Provider/Extender: Allen Derry ?Weeks in Treatment: 9 ?History of Present Illness ?HPI Description: Readmission: ?04/21/2021 upon evaluation today patient presents for reevaluation here in the clinic those been since 2016 since she was last here. I have cared ?for her husband more recently. With that being said she tells me currently that she is having a lot of discomfort with her wound. She does have ?some dementia issues. At one point she actually told the nurse that she goes horseback riding regularly. Nonetheless she is generally weak and ?not really able to even sit or stand or walk so that obviously is not the case. Either way the biggest issue here is that she has a sacral wound. She ?has a gel overlay for her hospital bed she generally lays on her side so I think that she is not really getting pressure  too much there. The only ?time she is really in her chair is when she is coming for doctor's appointment. With all that being said I do believe that the patient is experiencing ?some issues here with increased pain from this wound but I do not see any signs of active infection at this time. ?She has a history of diabetes mellitus type 2 although this seems to be under pretty good control he checks her blood sugar that is her husband ?occasionally gives insulin as needed but again he tells me he has not had to for some time. She also stated to be hypertensive though her blood ?pressure was good today and it does not appear that she is on any specific medications currently. A wound VAC has not been attempted at this ?point. ?05/05/2021 upon evaluation today patient appears to be doing well with regard to her wound. I do feel like we are showing some signs of ?improvement here which is great news. Fortunately I do not see any evidence of active infection locally or systemically at this point which is also ?excellent news. No fevers, chills, nausea, vomiting, or diarrhea. ?05/27/2021 upon evaluation today patient unfortunately continues to have issues here with her sacral wound. Fortunately I do not see any signs of ?infection which is good news but nonetheless this really has not been packed with the collagen the whole time like it supposed to be due to the ?fact that I did not get the supply order. This should be coming from [facility] although it sounds like they have been waiting for some time to get ?their order in unfortunately. The patient does not have any signs of systemic or local infection which is good news but definitely I think the ?collagen packed into the undermining could help to get this to heal much more effectively and quickly. ?06/24/2021  upon evaluation today patient appears to be doing well with regard to her wound. This is actually showing signs of excellent ?improvement which is great news and overall  I am extremely pleased with where we stand today. There does not appear to be any evidence of ?active infection locally nor systemically at this time which is great news. No fevers, chills, nausea, vomiting, or diarrhea. ?Electronic Signature(s) ?Signed: 06/24/2021 3:44:50 PM By: Lenda Kelp PA-C ?Entered By: Lenda Kelp on 06/24/2021 15:44:50 ?Tinnell, Zaina P. ([id number]) ?-------------------------------------------------------------------------------- ?Physical Exam Details ?Patient Name: [name], [name] ?Date of Service: 06/24/2021 3:00 PM ?Medical Record Number: [id number] ?Patient Account Number: [id number] ?Date of Birth/Sex: 11-03-1945 (76 y.o. F) ?Treating RN: Angelina Pih ?Primary Care Provider: Ethelda Chick Other Clinician: ?Referring Provider: Ethelda Chick ?Treating Provider/Extender: Allen Derry ?Weeks in Treatment: 9 ?Constitutional ?Well-nourished and well-hydrated in no acute distress. ?Respiratory ?normal breathing without difficulty. ?Psychiatric ?this patient is able to make decisions and demonstrates good insight into disease process. Alert and Oriented x 3. pleasant and cooperative. ?Notes ?Patient's wound actually showed signs of excellent improvement this appears to be doing great and I see no signs of infection which is great news. ?Electronic Signature(s) ?Signed: 06/24/2021 3:47:07 PM By: Lenda Kelp PA-C ?Entered By: Lenda Kelp on 06/24/2021 15:47:07 ?Wahab, Shona P. ([id number]) ?-------------------------------------------------------------------------------- ?Physician Orders Details ?Patient Name: [name], [name] ?Date of Service: 06/24/2021 3:00 PM ?Medical Record Number: [id number] ?Patient Account Number: [id number] ?Date of Birth/Sex: 04-11-1945 (76 y.o. F) ?Treating RN: Angelina Pih ?Primary Care Provider: Ethelda Chick Other Clinician: ?Referring Provider: Ethelda Chick ?Treating Provider/Extender: Allen Derry ?Weeks in Treatment: 9 ?Verbal /  Phone Orders: No ?Diagnosis Coding ?ICD-10 Coding ?Code Description ?L89.153 Pressure ulcer of sacral region, stage 3 ?E11.622 Type 2 diabetes mellitus with other skin ulcer ?M62.81 Muscle weakness (generalized) ?I10 Essential (primary) hypertension ?F01.A0 Vascular dementia, mild, without behavioral disturbance, psychotic disturbance, mood disturbance, and anxiety ?Follow-up Appointments ?o Return Appointment in 2 weeks. ?o Nurse Visit as needed ?Home Health ?o Home Health Company: ?o CONTINUE Home Health for wound care. May utilize formulary equivalent dressing for wound treatment orders unless ?otherwise specified. Home Health Nurse may visit PRN to address patientos wound care needs. ?o Scheduled days for dressing changes to be completed; exception, patient has scheduled wound care visit that day. ?o **Please direct any NON-WOUND related issues/requests for orders to patient's Primary Care Physician. **If current ?dressing causes regression in wound condition, may D/C ordered dressing product/s and apply Normal Saline Moist ?Dressing daily until next Wound Healing Center or Other MD appointment. **Notify Wound Healing Center of regression in ?wound condition at [phone]. ?Bathing/ Shower/ Hygiene ?o Wash wounds with antibacterial soap and water. ?o May shower; gently cleanse wound with antibacterial soap, rinse and pat dry prior to dressing wounds ?o No tub bath. ?Off-Loading ?o Turn and reposition every 2 hours ?Wound Treatment ?Wound #3 - Sacrum Wound Laterality: Midline ?Primary Dressing: Prisma 4.34 (in) (Generic) 3 x Per Week/30 Days ?Discharge Instructions: pack wound with dry prisma using a q tip ?Secondary Dressing: Gauze 3 x Per Week/30 Days ?Discharge Instructions: As directed:, moistened with saline ?Secured With: zetuvit silicone boarder 3 x Per Week/30 Days ?Discharge Instructions: placed over wound ?Electronic Signature(s) ?Signed: 06/24/2021 4:01:08 PM By: Angelina Pih ?Signed: 06/24/2021 4:21:45 PM By: Lenda Kelp PA-C ?Entered By: Angelina Pih on 06/24/2021 15:57:18 ?Klinke, Lima P. ([id number]) ?----------------------------------------------------------------

## 2021-06-24 NOTE — Progress Notes (Addendum)
Nary, Lan P. ([id number]) ?Visit Report for 06/24/2021 ?Arrival Information Details ?Patient Name: [name], [name] ?Date of Service: 06/24/2021 3:00 PM ?Medical Record Number: [id number] ?Patient Account Number: [id number] ?Date of Birth/Sex: 19-Aug-1945 (76 y.o. F) ?Treating RN: Angelina Pih ?Primary Care Denijah Karrer: Ethelda Chick Other Clinician: ?Referring Aerion Bagdasarian: Ethelda Chick ?Treating Courtne Lighty/Extender: Allen Derry ?Weeks in Treatment: 9 ?Visit Information History Since Last Visit ?Added or deleted any medications: No ?Patient Arrived: Wheel Chair ?Any new allergies or adverse reactions: No ?Arrival Time: 15:20 ?Had a fall or experienced change in No ?Accompanied By: husband ?activities of daily living that may affect ?Transfer Assistance: Nurse, adult ?risk of falls: ?Patient Identification Verified: Yes ?Hospitalized since last visit: No ?Secondary Verification Process Completed: Yes ?Has Dressing in Place as Prescribed: Yes ?Patient Has Alerts: Yes ?Pain Present Now: No ?Patient Alerts: Patient on Blood Thinner ?Electronic Signature(s) ?Signed: 06/24/2021 4:01:08 PM By: Angelina Pih ?Entered By: Angelina Pih on 06/24/2021 15:21:19 ?Molly [name], [name] ([id number]) ?-------------------------------------------------------------------------------- ?Clinic Level of Care Assessment Details ?Patient Name: [name], [name] ?Date of Service: 06/24/2021 3:00 PM ?Medical Record Number: [id number] ?Patient Account Number: [id number] ?Date of Birth/Sex: 1945-06-13 (76 y.o. F) ?Treating RN: Angelina Pih ?Primary Care Charma Mocarski: Ethelda Chick Other Clinician: ?Referring Yunique Dearcos: Ethelda Chick ?Treating Darcey Demma/Extender: Allen Derry ?Weeks in Treatment: 9 ?Clinic Level of Care Assessment Items ?TOOL 4 Quantity Score ?[]  - Use when only an EandM is performed on FOLLOW-UP visit 0 ?ASSESSMENTS - Nursing Assessment / Reassessment ?[]  - Reassessment of Co-morbidities (includes updates in patient status)  0 ?X- 1 5 ?Reassessment of Adherence to Treatment Plan ?ASSESSMENTS - Wound and Skin Assessment / Reassessment ?X - Simple Wound Assessment / Reassessment - one wound 1 5 ?[]  - 0 ?Complex Wound Assessment / Reassessment - multiple wounds ?[]  - 0 ?Dermatologic / Skin Assessment (not related to wound area) ?ASSESSMENTS - Focused Assessment ?[]  - Circumferential Edema Measurements - multi extremities 0 ?[]  - 0 ?Nutritional Assessment / Counseling / Intervention ?[]  - 0 ?Lower Extremity Assessment (monofilament, tuning fork, pulses) ?[]  - 0 ?Peripheral Arterial Disease Assessment (using hand held doppler) ?ASSESSMENTS - Ostomy and/or Continence Assessment and Care ?[]  - Incontinence Assessment and Management 0 ?[]  - 0 ?Ostomy Care Assessment and Management (repouching, etc.) ?PROCESS - Coordination of Care ?X - Simple Patient / Family Education for ongoing care 1 15 ?[]  - 0 ?Complex (extensive) Patient / Family Education for ongoing care ?[]  - 0 ?Staff obtains Consents, Records, Test Results / Process Orders ?[]  - 0 ?Staff telephones HHA, Nursing Homes / Clarify orders / etc ?[]  - 0 ?Routine Transfer to another Facility (non-emergent condition) ?[]  - 0 ?Routine Hospital Admission (non-emergent condition) ?[]  - 0 ?New Admissions / / Ordering NPWT, Apligraf, etc. ?[]  - 0 ?Emergency Hospital Admission (emergent condition) ?X- 1 10 ?Simple Discharge Coordination ?[]  - 0 ?Complex (extensive) Discharge Coordination ?PROCESS - Special Needs ?[]  - Pediatric / Minor Patient Management 0 ?[]  - 0 ?Isolation Patient Management ?[]  - 0 ?Hearing / Language / Visual special needs ?[]  - 0 ?Assessment of Community assistance (transportation, D/C planning, etc.) ?[]  - 0 ?Additional assistance / Altered mentation ?[]  - 0 ?Support Surface(s) Assessment (bed, cushion, seat, etc.) ?INTERVENTIONS - Wound Cleansing / Measurement ?[name], [name] ( ) ?X- 1 5 ?Simple Wound Cleansing - one wound ?[]  - 0 ?Complex  Wound Cleansing - multiple wounds ?X- 1 5 ?Wound Imaging (photographs - any number of wounds) ?[]  - 0 ?Wound Tracing (instead of photographs) ?X- 1 5 ?Simple Wound Measurement -  one wound ?[]  - 0 ?Complex Wound Measurement - multiple wounds ?INTERVENTIONS - Wound Dressings ?X - Small Wound Dressing one or multiple wounds 1 10 ?[]  - 0 ?Medium Wound Dressing one or multiple wounds ?[]  - 0 ?Large Wound Dressing one or multiple wounds ?[]  - 0 ?Application of Medications - topical ?[]  - 0 ?Application of Medications - injection ?INTERVENTIONS - Miscellaneous ?[]  - External ear exam 0 ?[]  - 0 ?Specimen Collection (cultures, biopsies, blood, body fluids, etc.) ?[]  - 0 ?Specimen(s) / Culture(s) sent or taken to Lab for analysis ?X- 1 10 ?Patient Transfer (multiple staff / / Similar devices) ?[]  - 0 ?Simple Staple / Suture removal (25 or less) ?[]  - 0 ?Complex Staple / Suture removal (26 or more) ?[]  - 0 ?Hypo / Hyperglycemic Management (close monitor of Blood Glucose) ?[]  - 0 ?Ankle / Brachial Index (ABI) - do not check if billed separately ?X- 1 5 ?Vital Signs ?Has the patient been seen at the hospital within the last three years: Yes ?Total Score: 75 ?Level Of Care: New/Established - Level ?2 ?Electronic Signature(s) ?Signed: 06/24/2021 4:01:08 PM By: ?Entered By: on 06/24/2021 15:58:21 ?Molly [name], [name] ( ) ?-------------------------------------------------------------------------------- ?Encounter Discharge Information Details ?Patient Name: [name], [name] ?Date of Service: 06/24/2021 3:00 PM ?Medical Record Number: ?Patient Account Number: Nurse, adult ?Date of Birth/Sex: Sep 23, 1945 (76 y.o. F) ?Treating RN: ?Primary Care Emrick Hensch: Other Clinician: ?Referring Dava Rensch: 06/26/2021 ?Treating Ahriyah Vannest/Extender: Angelina Pih ?Weeks in Treatment: 9 ?Encounter Discharge Information Items ?Discharge Condition:  Stable ?Ambulatory Status: Wheelchair ?Discharge Destination: Home ?Transportation: Other ?Accompanied By: husband ?Schedule Follow-up Appointment: Yes ?Clinical Summary of Care: ?Electronic Signature(s) ?Signed: 06/24/2021 4:01:08 PM By: 06/26/2021 ?Entered By: [id number] on 06/24/2021 15:59:33 ?Molly [name], [name] ([id number]) ?-------------------------------------------------------------------------------- ?Multi Wound Chart Details ?Patient Name: Molly [name], [name] ?Date of Service: 06/24/2021 3:00 PM ?Medical Record Number: 01/19/1946 ?Patient Account Number: ([id number] ?Date of Birth/Sex: 09/10/1945 (76 y.o. F) ?Treating RN: Ethelda Chick ?Primary Care Sahithi Ordoyne: Allen Derry Other Clinician: ?Referring Laportia Carley: 06/26/2021 ?Treating [place] Rosamond/Extender: Angelina Pih ?Weeks in Treatment: 9 ?Vital Signs ?Height(in): 66 ?Pulse(bpm): 64 ?Weight(lbs): 163 ?Blood Pressure(mmHg): 127/82 ?Body Mass Index(BMI): 26.3 ?Temperature(??F): 97.5 ?Respiratory Rate(breaths/min): 18 ?Photos: [N/A:N/A] ?Wound Location: Midline Sacrum N/A N/A ?Wounding Event: Pressure Injury N/A N/A ?Primary Etiology: Pressure Ulcer N/A N/A ?Comorbid History: Cataracts, Anemia, Asthma, N/A N/A ?Hypertension, Type II Diabetes, ?Osteoarthritis, Neuropathy ?Date Acquired: 11/28/2020 N/A N/A ?Weeks of Treatment: 9 N/A N/A ?Wound Status: Open N/A N/A ?Wound Recurrence: No N/A N/A ?Measurements L x W x D (cm) 0.5x0.3x0.3 N/A N/A ?Area (cm?) : 0.118 N/A N/A ?Volume (cm?) : 0.035 N/A N/A ?% Reduction in Area: 70.00% N/A N/A ?% Reduction in Volume: 70.30% N/A N/A ?Starting Position 1 (o'clock): 12 ?Ending Position 1 (o'clock): 4 ?Maximum Distance 1 (cm): 0.3 ?Undermining: Yes N/A N/A ?Classification: Category/Stage III N/A N/A ?Exudate Amount: Medium N/A N/A ?Exudate Type: Serosanguineous N/A N/A ?Exudate Color: red, brown N/A N/A ?Wound Margin: Distinct, outline attached N/A N/A ?Granulation Amount: Large (67-100%) N/A N/A ?Granulation  Quality: Red N/A N/A ?Necrotic Amount: Small (1-33%) N/A N/A ?Exposed Structures: ?Fat Layer (Subcutaneous Tissue): N/A N/A ?Yes ?Epithelialization: None N/A N/A ?Treatment Notes ?Electronic Signature(s) ?Signed: 06/24/2021 4

## 2021-06-30 ENCOUNTER — Ambulatory Visit
Admission: RE | Admit: 2021-06-30 | Discharge: 2021-06-30 | Disposition: A | Discharge status: Home / Self Care | Payer: Medicare HMO | Source: Ambulatory Visit | Attending: Student | Admitting: Student

## 2021-06-30 DIAGNOSIS — Z13820 Encounter for screening for osteoporosis: Secondary | ICD-10-CM | POA: Diagnosis present

## 2021-06-30 DIAGNOSIS — M810 Age-related osteoporosis without current pathological fracture: Secondary | ICD-10-CM | POA: Insufficient documentation

## 2021-06-30 DIAGNOSIS — Z780 Asymptomatic menopausal state: Secondary | ICD-10-CM | POA: Diagnosis not present

## 2021-06-30 DIAGNOSIS — Z1231 Encounter for screening mammogram for malignant neoplasm of breast: Secondary | ICD-10-CM | POA: Diagnosis present

## 2021-07-08 ENCOUNTER — Encounter: Payer: Medicare HMO | Attending: Physician Assistant | Admitting: Physician Assistant

## 2021-07-08 DIAGNOSIS — F01A0 Vascular dementia, mild, without behavioral disturbance, psychotic disturbance, mood disturbance, and anxiety: Secondary | ICD-10-CM | POA: Insufficient documentation

## 2021-07-08 DIAGNOSIS — M6281 Muscle weakness (generalized): Secondary | ICD-10-CM | POA: Insufficient documentation

## 2021-07-08 DIAGNOSIS — L89153 Pressure ulcer of sacral region, stage 3: Secondary | ICD-10-CM | POA: Insufficient documentation

## 2021-07-08 DIAGNOSIS — E11622 Type 2 diabetes mellitus with other skin ulcer: Secondary | ICD-10-CM | POA: Diagnosis not present

## 2021-07-08 DIAGNOSIS — I10 Essential (primary) hypertension: Secondary | ICD-10-CM | POA: Diagnosis not present

## 2021-07-08 NOTE — Progress Notes (Addendum)
Sauceda, Shakeira P. ([id number]) ?Visit Report for 07/08/2021 ?Chief Complaint Document Details ?Patient Name: Molly [name], [name] ?Date of Service: 07/08/2021 3:00 PM ?Medical Record Number: [id number] ?Patient Account Number: [id number] ?Date of Birth/Sex: 08/26/1945 [age] y.o. F) ?Treating RN: Angelina PihGordon, Caitlin ?Primary Care Provider: Ethelda Chickoberts, Caroline Other Clinician: ?Referring Provider: Ethelda Chickoberts, Caroline ?Treating Provider/Extender: Allen DerryStone, Juma Oxley ?Weeks in Treatment: 11 ?Information Obtained from: Patient ?Chief Complaint ?Sacral Pressure Ulcer Stage 3 ?Electronic Signature(s) ?Signed: 07/08/2021 2:55:15 PM By: Lenda KelpStone III, Tayonna Bacha PA-C ?Entered By: Lenda KelpStone III, Kanyon Bunn on 07/08/2021 14:55:15 ?Molly [name], [name] ([id number]) ?-------------------------------------------------------------------------------- ?HPI Details ?Patient Name: Molly [name], [name] ?Date of Service: 07/08/2021 3:00 PM ?Medical Record Number: [id number] ?Patient Account Number: [id number] ?Date of Birth/Sex: 08/26/1945 [age] y.o. F) ?Treating RN: Angelina PihGordon, Caitlin ?Primary Care Provider: Ethelda Chickoberts, Caroline Other Clinician: ?Referring Provider: Ethelda Chickoberts, Caroline ?Treating Provider/Extender: Allen DerryStone, Uvaldo Rybacki ?Weeks in Treatment: 11 ?History of Present Illness ?HPI Description: Readmission: ?04/21/2021 upon evaluation today patient presents for reevaluation here in the clinic those been since 2016 since she was last here. I have cared ?for her husband more recently. With that being said she tells me currently that she is having a lot of discomfort with her wound. She does have ?some dementia issues. At one point she actually told the nurse that she goes horseback riding regularly. Nonetheless she is generally weak and ?not really able to even sit or stand or walk so that obviously is not the case. Either way the biggest issue here is that she has a sacral wound. She ?has a gel overlay for her hospital bed she generally lays on her side so I think that she is not really getting pressure  too much there. The only ?time she is really in her chair is when she is coming for doctor's appointment. With all that being said I do believe that the patient is experiencing ?some issues here with increased pain from this wound but I do not see any signs of active infection at this time. ?She has a history of diabetes mellitus type 2 although this seems to be under pretty good control he checks her blood sugar that is her husband ?occasionally gives insulin as needed but again he tells me he has not had to for some time. She also stated to be hypertensive though her blood ?pressure was good today and it does not appear that she is on any specific medications currently. A wound VAC has not been attempted at this ?point. ?05/05/2021 upon evaluation today patient appears to be doing well with regard to her wound. I do feel like we are showing some signs of ?improvement here which is great news. Fortunately I do not see any evidence of active infection locally or systemically at this point which is also ?excellent news. No fevers, chills, nausea, vomiting, or diarrhea. ?05/27/2021 upon evaluation today patient unfortunately continues to have issues here with her sacral wound. Fortunately I do not see any signs of ?infection which is good news but nonetheless this really has not been packed with the collagen the whole time like it supposed to be due to the ?fact that I did not get the supply order. [facility] although it sounds like they have been waiting for some time to get ?their order in unfortunately. The patient does not have any signs of systemic or local infection which is good news but definitely I think the ?collagen packed into the undermining could help to get this to heal much more effectively and quickly. ?06/24/2021  upon evaluation today patient appears to be doing well with regard to her wound. This is actually showing signs of excellent ?improvement which is great news and overall  I am extremely pleased with where we stand today. There does not appear to be any evidence of ?active infection locally nor systemically at this time which is great news. No fevers, chills, nausea, vomiting, or diarrhea. ?07-08-2021 upon evaluation today patient appears to be doing well with regard to her wound in fact this appears to be completely healed which is ?great news. Fortunately I do not see any evidence of active infection locally or systemically which is great as well. ?Electronic Signature(s) ?Signed: 07/08/2021 4:06:42 PM By: Worthy Keeler PA-C ?Entered By: Worthy Keeler on 07/08/2021 16:06:42 ?Molly [name], [name] ([id number]) ?-------------------------------------------------------------------------------- ?Physical Exam Details ?Patient Name: Molly [name], [name] ?Date of Service: 07/08/2021 3:00 PM ?Medical Record Number: [id number] ?Patient Account Number: [id number] ?Date of Birth/Sex: [birth year] (76 y.o. F) ?Treating RN: Levora Dredge ?Primary Care Provider: Josephine Cables Other Clinician: ?Referring Provider: Josephine Cables ?Treating Provider/Extender: Jeri Cos ?Weeks in Treatment: 11 ?Constitutional ?Well-nourished and well-hydrated in no acute distress. ?Respiratory ?normal breathing without difficulty. ?Psychiatric ?this patient is able to make decisions and demonstrates good insight into disease process. Alert and Oriented x 3. pleasant and cooperative. ?Notes ?Upon inspection patient's wound bed showed signs of good epithelization I do not see any open wound there is a little divot where this healed but ?overall I think were doing quite well. ?Electronic Signature(s) ?Signed: 07/08/2021 4:07:07 PM By: Worthy Keeler PA-C ?Previous Signature: 07/08/2021 4:06:53 PM Version By: Worthy Keeler PA-C ?Entered By: Worthy Keeler on 07/08/2021 16:07:07 ?Molly [name], [name] ([id number]) ?-------------------------------------------------------------------------------- ?Physician Orders  Details ?Patient Name: Molly [name], [name] ?Date of Service: 07/08/2021 3:00 PM ?Medical Record Number: [id number] ?Patient Account Number: [id number] ?Date of Birth/Sex: Apr 23, 1945 (76 y.o. F) ?Treating RN: Levora Dredge ?Primary Care Provider: Josephine Cables Other Clinician: ?Referring Provider: Josephine Cables ?Treating Provider/Extender: Jeri Cos ?Weeks in Treatment: 11 ?Verbal / Phone Orders: No ?Diagnosis Coding ?ICD-10 Coding ?Code Description ?L89.153 Pressure ulcer of sacral region, stage 3 ?E11.622 Type 2 diabetes mellitus with other skin ulcer ?M62.81 Muscle weakness (generalized) ?I10 Essential (primary) hypertension ?F01.A0 Vascular dementia, mild, without behavioral disturbance, psychotic disturbance, mood disturbance, and anxiety ?Discharge From [facility] ?o Discharge from [facility] - Wound is healed, you may cover with a boarder form dressing for ?protection for another 1-2 weeks, changing every couple days. As long as you do not see and drainage or bleeding you are ok to no longer ?place dressing after that time. Please call with any issues that may arise. ?Home Health ?[facility] ?o Other Home Health Orders/Instructions: - AS OF 07/08/21 PER PA Stone wound is healed. Patient is discharged from wound care ?center, no further visits needed for wound care only from home health ?[facility] ?o Turn and reposition every 2 hours ?Electronic Signature(s) ?Signed: 07/09/2021 4:33:41 PM By: Levora Dredge ?Signed: 07/10/2021 3:59:26 PM By: Worthy Keeler PA-C ?Previous Signature: 07/08/2021 4:51:41 PM Version By: Levora Dredge ?Previous Signature: 07/08/2021 5:16:21 PM Version By: Worthy Keeler PA-C ?Entered By: Levora Dredge on 07/09/2021 16:18:13 ?Len, [name] ([id number]) ?-------------------------------------------------------------------------------- ?Problem List Details ?Patient Name: Molly [name], [name] ?Date of Service:  07/08/2021 3:00 PM ?Medical Record Number: [id number] ?Patient Account Number: [id number] ?Date of Birth/Sex: 05-08-1945 (76 y.o. F) ?Treating RN: Levora Dredge ?Primary Care Provider: Josephine Cables Other Clinician:

## 2021-07-08 NOTE — Progress Notes (Addendum)
Robinson, Raschelle P. ([id number]) ?Visit Report for 07/08/2021 ?Arrival Information Details ?Patient Name: Molly [name], [name] ?Date of Service: 07/08/2021 3:00 PM ?Medical Record Number: [id number] ?Patient Account Number: [id number] ?Date of Birth/Sex: 11-18-1945 (76 y.o. F) ?Treating RN: Levora Dredge ?Primary Care Damya Comley: Josephine Cables Other Clinician: ?Referring Haitham Dolinsky: Josephine Cables ?Treating Jamir Rone/Extender: Jeri Cos ?Weeks in Treatment: 11 ?Visit Information History Since Last Visit ?Added or deleted any medications: No ?Patient Arrived: Wheel Chair ?Any new allergies or adverse reactions: No ?Arrival Time: 15:19 ?Had a fall or experienced change in No ?Accompanied By: husband ?activities of daily living that may affect ?Transfer Assistance: Civil Service fast streamer ?risk of falls: ?Patient Identification Verified: Yes ?Hospitalized since last visit: No ?Secondary Verification Process Completed: Yes ?Has Dressing in Place as Prescribed: Yes ?Patient Has Alerts: Yes ?Pain Present Now: Yes ?Patient Alerts: Patient on Blood Thinner ?Electronic Signature(s) ?Signed: 07/08/2021 4:51:41 PM By: Levora Dredge ?Entered By: Levora Dredge on 07/08/2021 15:19:50 ?Schranz, Gelene P. ([id number]) ?-------------------------------------------------------------------------------- ?Clinic Level of Care Assessment Details ?Patient Name: [name], [name] ?Date of Service: 07/08/2021 3:00 PM ?Medical Record Number: [id number] ?Patient Account Number: [id number] ?Date of Birth/Sex: 09-19-1945 (76 y.o. F) ?Treating RN: Levora Dredge ?Primary Care Keller Bounds: Josephine Cables Other Clinician: ?Referring Leeandra Ellerson: Josephine Cables ?Treating Jacobey Gura/Extender: Jeri Cos ?Weeks in Treatment: 11 ?Clinic Level of Care Assessment Items ?TOOL 4 Quantity Score ?[]  - Use when only an EandM is performed on FOLLOW-UP visit 0 ?ASSESSMENTS - Nursing Assessment / Reassessment ?X - Reassessment of Co-morbidities (includes updates in patient status)  1 10 ?X- 1 5 ?Reassessment of Adherence to Treatment Plan ?ASSESSMENTS - Wound and Skin Assessment / Reassessment ?X - Simple Wound Assessment / Reassessment - one wound 1 5 ?[]  - 0 ?Complex Wound Assessment / Reassessment - multiple wounds ?[]  - 0 ?Dermatologic / Skin Assessment (not related to wound area) ?ASSESSMENTS - Focused Assessment ?[]  - Circumferential Edema Measurements - multi extremities 0 ?[]  - 0 ?Nutritional Assessment / Counseling / Intervention ?[]  - 0 ?Lower Extremity Assessment (monofilament, tuning fork, pulses) ?[]  - 0 ?Peripheral Arterial Disease Assessment (using hand held doppler) ?ASSESSMENTS - Ostomy and/or Continence Assessment and Care ?[]  - Incontinence Assessment and Management 0 ?[]  - 0 ?Ostomy Care Assessment and Management (repouching, etc.) ?PROCESS - Coordination of Care ?X - Simple Patient / Family Education for ongoing care 1 15 ?[]  - 0 ?Complex (extensive) Patient / Family Education for ongoing care ?[]  - 0 ?Staff obtains Consents, Records, Test Results / Process Orders ?[]  - 0 ?Staff telephones HHA, Nursing Homes / Clarify orders / etc ?[]  - 0 ?Routine Transfer to another Facility (non-emergent condition) ?[]  - 0 ?Routine Hospital Admission (non-emergent condition) ?[]  - 0 ?New Admissions / Biomedical engineer / Ordering NPWT, Apligraf, etc. ?[]  - 0 ?Emergency Hospital Admission (emergent condition) ?X- 1 10 ?Simple Discharge Coordination ?[]  - 0 ?Complex (extensive) Discharge Coordination ?PROCESS - Special Needs ?[]  - Pediatric / Minor Patient Management 0 ?[]  - 0 ?Isolation Patient Management ?[]  - 0 ?Hearing / Language / Visual special needs ?[]  - 0 ?Assessment of Community assistance (transportation, D/C planning, etc.) ?[]  - 0 ?Additional assistance / Altered mentation ?[]  - 0 ?Support Surface(s) Assessment (bed, cushion, seat, etc.) ?INTERVENTIONS - Wound Cleansing / Measurement ?Schwalb, Millissa P. ([id number]) ?X- 1 5 ?Simple Wound Cleansing - one wound ?[]  -  0 ?Complex Wound Cleansing - multiple wounds ?X- 1 5 ?Wound Imaging (photographs - any number of wounds) ?[]  - 0 ?Wound Tracing (instead of photographs) ?X- 1 5 ?Simple Wound  Measurement - one wound ?[]  - 0 ?Complex Wound Measurement - multiple wounds ?INTERVENTIONS - Wound Dressings ?X - Small Wound Dressing one or multiple wounds 1 10 ?[]  - 0 ?Medium Wound Dressing one or multiple wounds ?[]  - 0 ?Large Wound Dressing one or multiple wounds ?[]  - 0 ?Application of Medications - topical ?[]  - 0 ?Application of Medications - injection ?INTERVENTIONS - Miscellaneous ?[]  - External ear exam 0 ?[]  - 0 ?Specimen Collection (cultures, biopsies, blood, body fluids, etc.) ?[]  - 0 ?Specimen(s) / Culture(s) sent or taken to Lab for analysis ?X- 1 10 ?Patient Transfer (multiple staff / Civil Service fast streamer / Similar devices) ?[]  - 0 ?Simple Staple / Suture removal (25 or less) ?[]  - 0 ?Complex Staple / Suture removal (26 or more) ?[]  - 0 ?Hypo / Hyperglycemic Management (close monitor of Blood Glucose) ?[]  - 0 ?Ankle / Brachial Index (ABI) - do not check if billed separately ?X- 1 5 ?Vital Signs ?Has the patient been seen at the hospital within the last three years: Yes ?Total Score: 85 ?Level Of Care: New/Established - Level ?3 ?Electronic Signature(s) ?Signed: 07/08/2021 4:51:41 PM By: Levora Dredge ?Entered By: Levora Dredge on 07/08/2021 16:31:44 ?Morones, Jacoya P. ([id number]) ?-------------------------------------------------------------------------------- ?Encounter Discharge Information Details ?Patient Name: [name], [name] ?Date of Service: 07/08/2021 3:00 PM ?Medical Record Number: [id number] ?Patient Account Number: [id number] ?Date of Birth/Sex: 11-Nov-1945 (76 y.o. F) ?Treating RN: Levora Dredge ?Primary Care Tomiko Schoon: Josephine Cables Other Clinician: ?Referring Jayra Choyce: Josephine Cables ?Treating Wenzel Backlund/Extender: Jeri Cos ?Weeks in Treatment: 11 ?Encounter Discharge Information Items ?Discharge Condition:  Stable ?Ambulatory Status: Wheelchair ?Discharge Destination: Home ?Transportation: Other ?Accompanied By: self ?Schedule Follow-up Appointment: Yes ?Clinical Summary of Care: Patient Declined ?Electronic Signature(s) ?Signed: 07/08/2021 4:33:05 PM By: Levora Dredge ?Entered By: Levora Dredge on 07/08/2021 16:33:05 ?Tullis, Tamrah P. ([id number]) ?-------------------------------------------------------------------------------- ?Lower Extremity Assessment Details ?Patient Name: Romig, Shaeley P. ?Date of Service: 07/08/2021 3:00 PM ?Medical Record Number: [id number] ?Patient Account Number: [id number] ?Date of Birth/Sex: February 27, 1946 (76 y.o. F) ?Treating RN: Levora Dredge ?Primary Care Bueford Arp: Josephine Cables Other Clinician: ?Referring Rodnesha Elie: Josephine Cables ?Treating Gunnard Dorrance/Extender: Jeri Cos ?Weeks in Treatment: 11 ?Electronic Signature(s) ?Signed: 07/08/2021 4:51:41 PM By: Levora Dredge ?Entered By: Levora Dredge on 07/08/2021 15:45:07 ?Hagy, Naidelin P. ([id number]) ?-------------------------------------------------------------------------------- ?Multi Wound Chart Details ?Patient Name: Wiesman, Alean P. ?Date of Service: 07/08/2021 3:00 PM ?Medical Record Number: [id number] ?Patient Account Number: [id number] ?Date of Birth/Sex: July 03, 1945 (76 y.o. F) ?Treating RN: Levora Dredge ?Primary Care Kerianna Rawlinson: Josephine Cables Other Clinician: ?Referring Daana Petrasek: Josephine Cables ?Treating Quantay Zaremba/Extender: Jeri Cos ?Weeks in Treatment: 11 ?Vital Signs ?Height(in): 66 ?Pulse(bpm): 73 ?Weight(lbs): 163 ?Blood Pressure(mmHg): 112/62 ?Body Mass Index(BMI): 26.3 ?Temperature(??F): 97.6 ?Respiratory Rate(breaths/min): 18 ?Photos: [N/A:N/A] ?Wound Location: Midline Sacrum N/A N/A ?Wounding Event: Pressure Injury N/A N/A ?Primary Etiology: Pressure Ulcer N/A N/A ?Comorbid History: Cataracts, Anemia, Asthma, N/A N/A ?Hypertension, Type II Diabetes, ?Osteoarthritis, Neuropathy ?Date Acquired: 11/28/2020 N/A  N/A ?Weeks of Treatment: 11 N/A N/A ?Wound Status: Open N/A N/A ?Wound Recurrence: No N/A N/A ?Measurements L x W x D (cm) 0.6x0.2x0.3 N/A N/A ?Area (cm?) : 0.094 N/A N/A ?Volume (cm?) : 0.028 N/A N/A ?% Reduct

## 2021-07-10 ENCOUNTER — Other Ambulatory Visit: Payer: Self-pay | Admitting: Student

## 2021-07-10 DIAGNOSIS — R928 Other abnormal and inconclusive findings on diagnostic imaging of breast: Secondary | ICD-10-CM

## 2021-07-10 DIAGNOSIS — R921 Mammographic calcification found on diagnostic imaging of breast: Secondary | ICD-10-CM

## 2021-07-18 ENCOUNTER — Ambulatory Visit
Admission: RE | Admit: 2021-07-18 | Discharge: 2021-07-18 | Disposition: A | Discharge status: Home / Self Care | Payer: Medicare HMO | Source: Ambulatory Visit | Attending: Student | Admitting: Student

## 2021-07-18 ENCOUNTER — Other Ambulatory Visit: Payer: Self-pay | Admitting: Student

## 2021-07-18 DIAGNOSIS — R928 Other abnormal and inconclusive findings on diagnostic imaging of breast: Secondary | ICD-10-CM | POA: Insufficient documentation

## 2021-07-18 DIAGNOSIS — R921 Mammographic calcification found on diagnostic imaging of breast: Secondary | ICD-10-CM | POA: Insufficient documentation

## 2021-07-23 ENCOUNTER — Other Ambulatory Visit: Payer: Self-pay | Admitting: Student

## 2021-07-23 DIAGNOSIS — R921 Mammographic calcification found on diagnostic imaging of breast: Secondary | ICD-10-CM

## 2021-07-23 DIAGNOSIS — R928 Other abnormal and inconclusive findings on diagnostic imaging of breast: Secondary | ICD-10-CM

## 2021-07-28 ENCOUNTER — Inpatient Hospital Stay: Admission: RE | Admit: 2021-07-28 | Payer: Medicare HMO | Source: Ambulatory Visit

## 2021-08-07 ENCOUNTER — Ambulatory Visit
Admission: RE | Admit: 2021-08-07 | Discharge: 2021-08-07 | Disposition: A | Discharge status: Home / Self Care | Payer: Medicare HMO | Source: Ambulatory Visit | Attending: Student | Admitting: Student

## 2021-08-07 DIAGNOSIS — R928 Other abnormal and inconclusive findings on diagnostic imaging of breast: Secondary | ICD-10-CM | POA: Diagnosis present

## 2021-08-07 DIAGNOSIS — R921 Mammographic calcification found on diagnostic imaging of breast: Secondary | ICD-10-CM | POA: Insufficient documentation

## 2021-08-08 ENCOUNTER — Other Ambulatory Visit: Payer: Self-pay | Admitting: Student

## 2021-08-13 ENCOUNTER — Other Ambulatory Visit: Payer: Self-pay | Admitting: Endocrinology

## 2021-08-13 ENCOUNTER — Other Ambulatory Visit: Payer: Self-pay | Admitting: Student

## 2021-08-13 DIAGNOSIS — R928 Other abnormal and inconclusive findings on diagnostic imaging of breast: Secondary | ICD-10-CM

## 2021-08-13 DIAGNOSIS — R921 Mammographic calcification found on diagnostic imaging of breast: Secondary | ICD-10-CM

## 2021-08-20 ENCOUNTER — Ambulatory Visit
Admission: RE | Admit: 2021-08-20 | Discharge: 2021-08-20 | Disposition: A | Discharge status: Home / Self Care | Payer: Medicare HMO | Source: Ambulatory Visit | Attending: Student | Admitting: Student

## 2021-08-20 DIAGNOSIS — R921 Mammographic calcification found on diagnostic imaging of breast: Secondary | ICD-10-CM | POA: Insufficient documentation

## 2021-08-20 DIAGNOSIS — R928 Other abnormal and inconclusive findings on diagnostic imaging of breast: Secondary | ICD-10-CM | POA: Diagnosis present

## 2021-08-21 LAB — SURGICAL PATHOLOGY

## 2021-09-12 ENCOUNTER — Other Ambulatory Visit: Payer: Self-pay | Admitting: Student

## 2021-09-12 DIAGNOSIS — N63 Unspecified lump in unspecified breast: Secondary | ICD-10-CM

## 2021-10-01 ENCOUNTER — Other Ambulatory Visit: Payer: Medicare HMO

## 2021-10-01 ENCOUNTER — Inpatient Hospital Stay: Admission: RE | Admit: 2021-10-01 | Payer: Medicare HMO | Source: Ambulatory Visit

## 2021-10-17 ENCOUNTER — Other Ambulatory Visit: Payer: Medicare HMO

## 2022-02-23 ENCOUNTER — Other Ambulatory Visit: Payer: Medicare HMO

## 2023-03-07 ENCOUNTER — Other Ambulatory Visit: Payer: Self-pay

## 2023-03-07 ENCOUNTER — Inpatient Hospital Stay
Admission: EM | Admit: 2023-03-07 | Discharge: 2023-03-11 | DRG: 593 | Disposition: A | Discharge status: Home Health | Payer: Medicare HMO | Attending: Internal Medicine | Admitting: Internal Medicine

## 2023-03-07 ENCOUNTER — Emergency Department: Payer: Medicare HMO

## 2023-03-07 DIAGNOSIS — Z8619 Personal history of other infectious and parasitic diseases: Secondary | ICD-10-CM

## 2023-03-07 DIAGNOSIS — E872 Acidosis, unspecified: Secondary | ICD-10-CM | POA: Diagnosis present

## 2023-03-07 DIAGNOSIS — E785 Hyperlipidemia, unspecified: Secondary | ICD-10-CM | POA: Diagnosis present

## 2023-03-07 DIAGNOSIS — E11621 Type 2 diabetes mellitus with foot ulcer: Secondary | ICD-10-CM | POA: Diagnosis present

## 2023-03-07 DIAGNOSIS — R627 Adult failure to thrive: Secondary | ICD-10-CM | POA: Diagnosis present

## 2023-03-07 DIAGNOSIS — R5381 Other malaise: Secondary | ICD-10-CM | POA: Diagnosis present

## 2023-03-07 DIAGNOSIS — Z7982 Long term (current) use of aspirin: Secondary | ICD-10-CM | POA: Diagnosis not present

## 2023-03-07 DIAGNOSIS — I10 Essential (primary) hypertension: Secondary | ICD-10-CM | POA: Diagnosis present

## 2023-03-07 DIAGNOSIS — Z87891 Personal history of nicotine dependence: Secondary | ICD-10-CM | POA: Diagnosis not present

## 2023-03-07 DIAGNOSIS — E876 Hypokalemia: Secondary | ICD-10-CM | POA: Diagnosis present

## 2023-03-07 DIAGNOSIS — E860 Dehydration: Secondary | ICD-10-CM | POA: Diagnosis present

## 2023-03-07 DIAGNOSIS — L89159 Pressure ulcer of sacral region, unspecified stage: Secondary | ICD-10-CM | POA: Diagnosis present

## 2023-03-07 DIAGNOSIS — E440 Moderate protein-calorie malnutrition: Secondary | ICD-10-CM | POA: Diagnosis present

## 2023-03-07 DIAGNOSIS — E0590 Thyrotoxicosis, unspecified without thyrotoxic crisis or storm: Secondary | ICD-10-CM | POA: Diagnosis present

## 2023-03-07 DIAGNOSIS — F03B0 Unspecified dementia, moderate, without behavioral disturbance, psychotic disturbance, mood disturbance, and anxiety: Secondary | ICD-10-CM | POA: Diagnosis not present

## 2023-03-07 DIAGNOSIS — S31000A Unspecified open wound of lower back and pelvis without penetration into retroperitoneum, initial encounter: Secondary | ICD-10-CM | POA: Diagnosis not present

## 2023-03-07 DIAGNOSIS — Z794 Long term (current) use of insulin: Secondary | ICD-10-CM | POA: Diagnosis not present

## 2023-03-07 DIAGNOSIS — Z823 Family history of stroke: Secondary | ICD-10-CM

## 2023-03-07 DIAGNOSIS — E041 Nontoxic single thyroid nodule: Secondary | ICD-10-CM | POA: Diagnosis present

## 2023-03-07 DIAGNOSIS — I69354 Hemiplegia and hemiparesis following cerebral infarction affecting left non-dominant side: Secondary | ICD-10-CM

## 2023-03-07 DIAGNOSIS — L089 Local infection of the skin and subcutaneous tissue, unspecified: Secondary | ICD-10-CM | POA: Diagnosis not present

## 2023-03-07 DIAGNOSIS — Z83438 Family history of other disorder of lipoprotein metabolism and other lipidemia: Secondary | ICD-10-CM

## 2023-03-07 DIAGNOSIS — L89312 Pressure ulcer of right buttock, stage 2: Secondary | ICD-10-CM | POA: Diagnosis present

## 2023-03-07 DIAGNOSIS — Z6821 Body mass index (BMI) 21.0-21.9, adult: Secondary | ICD-10-CM

## 2023-03-07 DIAGNOSIS — F01518 Vascular dementia, unspecified severity, with other behavioral disturbance: Secondary | ICD-10-CM | POA: Diagnosis present

## 2023-03-07 DIAGNOSIS — Z79899 Other long term (current) drug therapy: Secondary | ICD-10-CM

## 2023-03-07 DIAGNOSIS — E1169 Type 2 diabetes mellitus with other specified complication: Secondary | ICD-10-CM | POA: Diagnosis present

## 2023-03-07 DIAGNOSIS — E213 Hyperparathyroidism, unspecified: Secondary | ICD-10-CM | POA: Diagnosis present

## 2023-03-07 DIAGNOSIS — Z7401 Bed confinement status: Secondary | ICD-10-CM | POA: Diagnosis not present

## 2023-03-07 DIAGNOSIS — F05 Delirium due to known physiological condition: Secondary | ICD-10-CM | POA: Diagnosis present

## 2023-03-07 DIAGNOSIS — L0390 Cellulitis, unspecified: Principal | ICD-10-CM

## 2023-03-07 DIAGNOSIS — Z803 Family history of malignant neoplasm of breast: Secondary | ICD-10-CM

## 2023-03-07 DIAGNOSIS — Z7189 Other specified counseling: Secondary | ICD-10-CM | POA: Diagnosis not present

## 2023-03-07 DIAGNOSIS — Z7984 Long term (current) use of oral hypoglycemic drugs: Secondary | ICD-10-CM

## 2023-03-07 DIAGNOSIS — L97519 Non-pressure chronic ulcer of other part of right foot with unspecified severity: Secondary | ICD-10-CM | POA: Diagnosis present

## 2023-03-07 DIAGNOSIS — G9341 Metabolic encephalopathy: Secondary | ICD-10-CM | POA: Diagnosis not present

## 2023-03-07 DIAGNOSIS — L89153 Pressure ulcer of sacral region, stage 3: Principal | ICD-10-CM | POA: Diagnosis present

## 2023-03-07 DIAGNOSIS — S31000D Unspecified open wound of lower back and pelvis without penetration into retroperitoneum, subsequent encounter: Secondary | ICD-10-CM | POA: Diagnosis not present

## 2023-03-07 DIAGNOSIS — Z515 Encounter for palliative care: Secondary | ICD-10-CM | POA: Diagnosis not present

## 2023-03-07 DIAGNOSIS — T148XXA Other injury of unspecified body region, initial encounter: Secondary | ICD-10-CM | POA: Diagnosis not present

## 2023-03-07 DIAGNOSIS — J449 Chronic obstructive pulmonary disease, unspecified: Secondary | ICD-10-CM | POA: Diagnosis present

## 2023-03-07 LAB — COMPREHENSIVE METABOLIC PANEL
ALT: 10 U/L (ref 0–44)
AST: 16 U/L (ref 15–41)
Albumin: 3.4 g/dL — ABNORMAL LOW (ref 3.5–5.0)
Alkaline Phosphatase: 76 U/L (ref 38–126)
Anion gap: 15 (ref 5–15)
BUN: 24 mg/dL — ABNORMAL HIGH (ref 8–23)
CO2: 25 mmol/L (ref 22–32)
Calcium: 9.1 mg/dL (ref 8.9–10.3)
Chloride: 96 mmol/L — ABNORMAL LOW (ref 98–111)
Creatinine, Ser: 0.6 mg/dL (ref 0.44–1.00)
GFR, Estimated: >60 mL/min (ref >60)
Glucose, Bld: 126 mg/dL — ABNORMAL HIGH (ref 70–99)
Potassium: 3.4 mmol/L — ABNORMAL LOW (ref 3.5–5.1)
Sodium: 136 mmol/L (ref 135–145)
Total Bilirubin: 1.6 mg/dL — ABNORMAL HIGH (ref <1.2)
Total Protein: 7.4 g/dL (ref 6.5–8.1)

## 2023-03-07 LAB — URINALYSIS, W/ REFLEX TO CULTURE (INFECTION SUSPECTED)
Bacteria, UA: NONE SEEN
Glucose, UA: NEGATIVE mg/dL
Ketones, ur: 40 mg/dL — AB
Leukocytes,Ua: NEGATIVE
Nitrite: NEGATIVE
Protein, ur: 30 mg/dL — AB
Specific Gravity, Urine: 1.025 (ref 1.005–1.030)
pH: 5 (ref 5.0–8.0)

## 2023-03-07 LAB — CBC WITH DIFFERENTIAL/PLATELET
Abs Immature Granulocytes: 0.03 10*3/uL (ref 0.00–0.07)
Basophils Absolute: 0 10*3/uL (ref 0.0–0.1)
Basophils Relative: 0 %
Eosinophils Absolute: 0.1 10*3/uL (ref 0.0–0.5)
Eosinophils Relative: 1 %
HCT: 43.6 % (ref 36.0–46.0)
Hemoglobin: 13.8 g/dL (ref 12.0–15.0)
Immature Granulocytes: 0 %
Lymphocytes Relative: 19 %
Lymphs Abs: 1.4 10*3/uL (ref 0.7–4.0)
MCH: 26 pg (ref 26.0–34.0)
MCHC: 31.7 g/dL (ref 30.0–36.0)
MCV: 82.1 fL (ref 80.0–100.0)
Monocytes Absolute: 0.5 10*3/uL (ref 0.1–1.0)
Monocytes Relative: 7 %
Neutro Abs: 5.1 10*3/uL (ref 1.7–7.7)
Neutrophils Relative %: 73 %
Platelets: 316 10*3/uL (ref 150–400)
RBC: 5.31 MIL/uL — ABNORMAL HIGH (ref 3.87–5.11)
RDW: 14.3 % (ref 11.5–15.5)
WBC: 7.1 10*3/uL (ref 4.0–10.5)
nRBC: 0 % (ref 0.0–0.2)

## 2023-03-07 LAB — LACTIC ACID, PLASMA: Lactic Acid, Venous: 1.7 mmol/L (ref 0.5–1.9)

## 2023-03-07 LAB — PROTIME-INR
INR: 1.1 (ref 0.8–1.2)
Prothrombin Time: 14.7 s (ref 11.4–15.2)

## 2023-03-07 MED ORDER — NICOTINE 14 MG/24HR TD PT24: 14.0000 mg | MEDICATED_PATCH | Freq: Every day | TRANSDERMAL | Status: DC | PRN

## 2023-03-07 MED ORDER — HYDRALAZINE HCL 20 MG/ML IJ SOLN: 5.0000 mg | INTRAMUSCULAR | Status: DC | PRN

## 2023-03-07 MED ORDER — ASPIRIN 81 MG PO TBEC
81.0000 mg | DELAYED_RELEASE_TABLET | Freq: Every day | ORAL | Status: DC
  Administered 2023-03-08 – 2023-03-11 (×4): 81 mg via ORAL
  Filled 2023-03-07 (×4): qty 1

## 2023-03-07 MED ORDER — VANCOMYCIN HCL IN DEXTROSE 1-5 GM/200ML-% IV SOLN
1000.0000 mg | Freq: Once | INTRAVENOUS | Status: AC
Start: 2023-03-07 — End: 2023-03-07
  Administered 2023-03-07: 1000 mg via INTRAVENOUS
  Filled 2023-03-07: qty 200

## 2023-03-07 MED ORDER — DEXTROSE-SODIUM CHLORIDE 5-0.9 % IV SOLN: INTRAVENOUS | Status: DC

## 2023-03-07 MED ORDER — SODIUM CHLORIDE 0.9 % IV SOLN
2.0000 g | Freq: Three times a day (TID) | INTRAVENOUS | Status: DC
  Administered 2023-03-07 – 2023-03-09 (×5): 2 g via INTRAVENOUS
  Filled 2023-03-07 (×6): qty 12.5

## 2023-03-07 MED ORDER — ALBUTEROL SULFATE (2.5 MG/3ML) 0.083% IN NEBU: 2.5000 mg | INHALATION_SOLUTION | Freq: Four times a day (QID) | RESPIRATORY_TRACT | Status: DC | PRN

## 2023-03-07 MED ORDER — ATORVASTATIN CALCIUM 20 MG PO TABS
20.0000 mg | ORAL_TABLET | Freq: Every day | ORAL | Status: DC
Start: 2023-03-08 — End: 2023-03-08

## 2023-03-07 MED ORDER — ACETAMINOPHEN 650 MG RE SUPP: 650.0000 mg | Freq: Four times a day (QID) | RECTAL | Status: DC | PRN

## 2023-03-07 MED ORDER — ACETAMINOPHEN 325 MG PO TABS: 650.0000 mg | ORAL_TABLET | Freq: Four times a day (QID) | ORAL | Status: DC | PRN

## 2023-03-07 MED ORDER — MORPHINE SULFATE (PF) 2 MG/ML IV SOLN
2.0000 mg | INTRAVENOUS | Status: AC | PRN
  Administered 2023-03-08: 2 mg via INTRAVENOUS
  Filled 2023-03-07: qty 1

## 2023-03-07 MED ORDER — HEPARIN SODIUM (PORCINE) 5000 UNIT/ML IJ SOLN
5000.0000 [IU] | Freq: Two times a day (BID) | INTRAMUSCULAR | Status: DC
  Administered 2023-03-07 – 2023-03-11 (×8): 5000 [IU] via SUBCUTANEOUS
  Filled 2023-03-07 (×8): qty 1

## 2023-03-07 MED ORDER — VANCOMYCIN HCL IN DEXTROSE 1-5 GM/200ML-% IV SOLN
1000.0000 mg | Freq: Once | INTRAVENOUS | Status: AC
  Administered 2023-03-07: 1000 mg via INTRAVENOUS
  Filled 2023-03-07: qty 200

## 2023-03-07 MED ORDER — SODIUM CHLORIDE 0.9 % IV BOLUS
500.0000 mL | Freq: Once | INTRAVENOUS | Status: AC
  Administered 2023-03-07: 500 mL via INTRAVENOUS

## 2023-03-07 MED ORDER — ONDANSETRON HCL 4 MG/2ML IJ SOLN
4.0000 mg | Freq: Four times a day (QID) | INTRAMUSCULAR | Status: DC | PRN
Start: 2023-03-07 — End: 2023-03-12

## 2023-03-07 MED ORDER — ONDANSETRON HCL 4 MG PO TABS: 4.0000 mg | ORAL_TABLET | Freq: Four times a day (QID) | ORAL | Status: DC | PRN

## 2023-03-07 MED ORDER — DOCUSATE SODIUM 100 MG PO CAPS: 100.0000 mg | ORAL_CAPSULE | Freq: Every day | ORAL | Status: DC | PRN

## 2023-03-07 NOTE — ED Triage Notes (Signed)
Refer to first nurse note 

## 2023-03-07 NOTE — Assessment & Plan Note (Addendum)
Will continue with broad-spectrum IV antibiotics with vancomycin along with cefepime coverage and Flagyl as patient is a diabetic will get pharmacy consults for both.  Every 2 hours turn.  Wound care team consult.  Additional specialty consults as needed.

## 2023-03-07 NOTE — H&P (Signed)
History and Physical    Patient: Molly [name] ZOX:[id number] DOB: [birth year] DOA: 03/07/2023 DOS: the patient was seen and examined on 03/07/2023 PCP: [facility]  Patient coming from: Home  Chief Complaint:  Chief Complaint  Patient presents with   Wound Check   HPI: Molly [name] is a 77 y.o. female with medical history significant for diabetes mellitus type 2, chronic anemia, cellulitis, COPD, hypertension, history of strokes, history of GI bleeds, history of C. 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  The wound has been there for several weeks and patient noticed it and said it was getting worse over the past few days and tender .Patient is otherwise in no acute distress. In emergency room vitals trend shows: Vitals:   03/07/23 1930 03/07/23 2000 03/07/23 2100 03/07/23 2130  BP: 122/80 130/76 139/83 (!) 139/92  Pulse: (!) 111 (!) 108 (!) 119 (!) 118  Temp:      Resp:   16 18  Height:      Weight:      SpO2:   96% 96%  TempSrc:      BMI (Calculated):      EKG none.  Labs are notable for : -Metabolic panel showing potassium 3.4 glucose 126 normal kidney function total bili at 1.6. - Normal CBC. - Normal lactic acid. -Admission requested for cellulitis.  In the ED pt received: Medications  vancomycin (VANCOCIN) IVPB 1000 mg/200 mL premix (1,000 mg Intravenous New Bag/Given 03/07/23 2155)  sodium chloride 0.9 % bolus 500 mL (0 mLs Intravenous Stopped 03/07/23 2053)  sodium chloride 0.9 % bolus 500 mL (500 mLs Intravenous New Bag/Given 03/07/23 2155)   Review of Systems  All other systems reviewed and are negative.  Past Medical History:  Diagnosis Date   Anxiety    COPD (chronic obstructive pulmonary disease) (HCC)    Diabetes (HCC)    Hemorrhoid    HLD (hyperlipidemia)    HTN (hypertension)    Stroke (HCC) 2008   left weakness   Past Surgical History:   Procedure Laterality Date   AMPUTATION Left 05/22/2014   Procedure: AMPUTATION RAY LEFT FOURTH AND FIFTH ;  Surgeon: Toni Arthurs, MD;  Location: [facility] OR;  Service: Orthopedics;  Laterality: Left;   CATARACT EXTRACTION     COLONOSCOPY  2004   COLONOSCOPY WITH PROPOFOL N/A 04/29/2016   Procedure: COLONOSCOPY WITH PROPOFOL;  Surgeon: Wyline Mood, MD;  Location: [facility] ENDOSCOPY;  Service: Gastroenterology;  Laterality: N/A;   ESOPHAGOGASTRODUODENOSCOPY (EGD) WITH PROPOFOL N/A 04/27/2016   Procedure: ESOPHAGOGASTRODUODENOSCOPY (EGD) WITH PROPOFOL;  Surgeon: Midge Minium, MD;  Location: [facility] ENDOSCOPY;  Service: Endoscopy;  Laterality: N/A;    reports that she quit smoking about 54 years ago. Her smoking use included cigarettes. She started smoking about 56 years ago. She has a 1 pack-year smoking history. She has never used smokeless tobacco. She reports that she does not drink alcohol and does not use drugs.  No Known Allergies  Family History  Problem Relation Age of Onset   Hyperlipidemia Mother    Varicose Veins Mother    Deep vein thrombosis Father    Stroke Father    Breast cancer Paternal Aunt    Stroke Maternal Grandmother    Stroke Paternal Grandmother     Prior to Admission medications   Medication Sig Start Date End Date Taking? Authorizing Provider  acetaminophen (TYLENOL) 325 MG tablet Take 650  mg by mouth every 6 (six) hours as needed for moderate pain or mild pain.    [provider]  albuterol (PROVENTIL HFA;VENTOLIN HFA) 108 (90 Base) MCG/ACT inhaler Inhale 2 puffs into the lungs every 6 (six) hours as needed for shortness of breath. Shake well. Wait 1 minute between puffs. 05/20/09   [provider]  amoxicillin-clavulanate (AUGMENTIN) 875-125 MG tablet Take 1 tablet by mouth every 12 (twelve) hours. 03/29/21   Azucena Fallen, MD  aspirin EC 81 MG tablet Take 81 mg by mouth daily.  02/28/09   [provider]  atorvastatin (LIPITOR) 20 MG tablet  Take 20 mg by mouth daily at 6 PM.  12/07/19   [provider]  bisacodyl (DULCOLAX) 10 MG suppository Place 10 mg rectally every 3 (three) days. Hold for loose stool    [provider]  Cholecalciferol (VITAMIN D3) 1000 UNITS CAPS Take 1,000 Units by mouth daily.    [provider]  collagenase (SANTYL) ointment Apply topically daily. 03/29/21   Azucena Fallen, MD  diclofenac sodium (VOLTAREN) 1 % GEL Apply 2 g topically 4 (four) times daily. To each shoulders    [provider]  GENERLAC 10 GM/15ML SOLN Take 15 mLs by mouth daily. For constipation. Note days given. 07/08/20   [provider]  HUMALOG 100 UNIT/ML injection Inject 1-6 Units into the skin See admin instructions. Sliding scale. Sugar 150-199=1U, 200-249=2U, 250-299=3U, 300-349=4U, 350-400=5U, >400=6U and call provider 01/04/20   [provider]  metFORMIN (GLUCOPHAGE) 500 MG tablet Take 500 mg by mouth 2 (two) times daily. 07/18/20   [provider]  OXYGEN Inhale 2 L into the lungs daily as needed (to keep O2 stats >90%).    [provider]  polyethylene glycol (MIRALAX / GLYCOLAX) 17 g packet Take 17 g by mouth daily. Dissolved in 4 - 9 ounces of liquid. Hold for loose stools.    [provider]  senna-docusate (SENOKOT-S) 8.6-50 MG tablet Take 2 tablets by mouth 2 (two) times daily.    [provider]  simethicone (MYLICON) 80 MG chewable tablet Chew 160 mg by mouth daily as needed for flatulence. 07/17/20   [provider]  traZODone (DESYREL) 50 MG tablet Take 25 mg by mouth at bedtime. 09/11/19   [provider]  TRESIBA FLEXTOUCH 100 UNIT/ML FlexTouch Pen Inject 10 Units into the skin daily. 07/10/20   [provider]     Vitals:   03/07/23 1930 03/07/23 2000 03/07/23 2100 03/07/23 2130  BP: 122/80 130/76 139/83 (!) 139/92  Pulse: (!) 111 (!) 108 (!) 119 (!) 118  Resp:   16 18  Temp:      TempSrc:      SpO2:    96% 96%  Weight:      Height:       Physical Exam Vitals and nursing note reviewed.  Constitutional:      General: She is not in acute distress. HENT:     Head: Normocephalic and atraumatic.     Right Ear: Hearing normal.     Left Ear: Hearing normal.     Nose: Nose normal. No nasal deformity.     Mouth/Throat:     Lips: Pink.     Tongue: No lesions.     Pharynx: Oropharynx is clear.  Eyes:     General: Lids are normal.     Extraocular Movements: Extraocular movements intact.  Cardiovascular:     Rate and Rhythm: Normal  rate and regular rhythm.     Heart sounds: Normal heart sounds.  Pulmonary:     Effort: Pulmonary effort is normal.     Breath sounds: Normal breath sounds.  Abdominal:     General: Bowel sounds are normal. There is no distension.     Palpations: Abdomen is soft. There is no mass.     Tenderness: There is no abdominal tenderness.  Musculoskeletal:     Right lower leg: No edema.     Left lower leg: No edema.  Skin:    General: Skin is warm.  Neurological:     General: No focal deficit present.     Mental Status: She is alert and oriented to person, place, and time.     Cranial Nerves: Cranial nerves 2-12 are intact.  Psychiatric:        Mood and Affect: Mood normal.        Speech: Speech normal.        Behavior: Behavior is cooperative.      Labs on Admission: I have personally reviewed following labs and imaging studies Results for orders placed or performed during the hospital encounter of 03/07/23 (from the past 24 hour(s))  Comprehensive metabolic panel     Status: Abnormal   Collection Time: 03/07/23  5:29 PM  Result Value Ref Range   Sodium 136 135 - 145 mmol/L   Potassium 3.4 (L) 3.5 - 5.1 mmol/L   Chloride 96 (L) 98 - 111 mmol/L   CO2 25 22 - 32 mmol/L   Glucose, Bld 126 (H) 70 - 99 mg/dL   BUN 24 (H) 8 - 23 mg/dL   Creatinine, Ser 1.61 0.44 - 1.00 mg/dL   Calcium 9.1 8.9 - 09.6 mg/dL   Total Protein 7.4 6.5 - 8.1 g/dL   Albumin 3.4  (L) 3.5 - 5.0 g/dL   AST 16 15 - 41 U/L   ALT 10 0 - 44 U/L   Alkaline Phosphatase 76 38 - 126 U/L   Total Bilirubin 1.6 (H) <1.2 mg/dL   GFR, Estimated >04 >54 mL/min   Anion gap 15 5 - 15  Lactic acid, plasma     Status: None   Collection Time: 03/07/23  5:29 PM  Result Value Ref Range   Lactic Acid, Venous 1.7 0.5 - 1.9 mmol/L  CBC with Differential     Status: Abnormal   Collection Time: 03/07/23  5:29 PM  Result Value Ref Range   WBC 7.1 4.0 - 10.5 K/uL   RBC 5.31 (H) 3.87 - 5.11 MIL/uL   Hemoglobin 13.8 12.0 - 15.0 g/dL   HCT 09.8 11.9 - 14.7 %   MCV 82.1 80.0 - 100.0 fL   MCH 26.0 26.0 - 34.0 pg   MCHC 31.7 30.0 - 36.0 g/dL   RDW 82.9 56.2 - 13.0 %   Platelets 316 150 - 400 K/uL   nRBC 0.0 0.0 - 0.2 %   Neutrophils Relative % 73 %   Neutro Abs 5.1 1.7 - 7.7 K/uL   Lymphocytes Relative 19 %   Lymphs Abs 1.4 0.7 - 4.0 K/uL   Monocytes Relative 7 %   Monocytes Absolute 0.5 0.1 - 1.0 K/uL   Eosinophils Relative 1 %   Eosinophils Absolute 0.1 0.0 - 0.5 K/uL   Basophils Relative 0 %   Basophils Absolute 0.0 0.0 - 0.1 K/uL   Immature Granulocytes 0 %   Abs Immature Granulocytes 0.03 0.00 - 0.07 K/uL  Protime-INR  Status: None   Collection Time: 03/07/23  5:29 PM  Result Value Ref Range   Prothrombin Time 14.7 11.4 - 15.2 seconds   INR 1.1 0.8 - 1.2  Urinalysis, w/ Reflex to Culture (Infection Suspected) -Urine, Clean Catch     Status: Abnormal   Collection Time: 03/07/23  5:31 PM  Result Value Ref Range   Specimen Source URINE, CATHETERIZED    Color, Urine YELLOW YELLOW   APPearance CLEAR CLEAR   Specific Gravity, Urine 1.025 1.005 - 1.030   pH 5.0 5.0 - 8.0   Glucose, UA NEGATIVE NEGATIVE mg/dL   Hgb urine dipstick TRACE (A) NEGATIVE   Bilirubin Urine MODERATE (A) NEGATIVE   Ketones, ur 40 (A) NEGATIVE mg/dL   Protein, ur 30 (A) NEGATIVE mg/dL   Nitrite NEGATIVE NEGATIVE   Leukocytes,Ua NEGATIVE NEGATIVE   Squamous Epithelial / HPF 0-5 0 - 5 /HPF   WBC, UA  0-5 0 - 5 WBC/hpf   RBC / HPF 0-5 0 - 5 RBC/hpf   Bacteria, UA NONE SEEN NONE SEEN   Mucus PRESENT    CBC: Recent Labs  Lab 03/07/23 1729  WBC 7.1  NEUTROABS 5.1  HGB 13.8  HCT 43.6  MCV 82.1  PLT 316   Basic Metabolic Panel: Recent Labs  Lab 03/07/23 1729  NA 136  K 3.4*  CL 96*  CO2 25  GLUCOSE 126*  BUN 24*  CREATININE 0.60  CALCIUM 9.1   GFR: Estimated Creatinine Clearance: 63.4 mL/min (by C-G formula based on SCr of 0.6 mg/dL). Liver Function Tests: Recent Labs  Lab 03/07/23 1729  AST 16  ALT 10  ALKPHOS 76  BILITOT 1.6*  PROT 7.4  ALBUMIN 3.4*   No results for input(s): "LIPASE", "AMYLASE" in the last 168 hours. No results for input(s): "AMMONIA" in the last 168 hours. Coagulation Profile: Recent Labs  Lab 03/07/23 1729  INR 1.1    Urinalysis    Component Value Date/Time   COLORURINE YELLOW 03/07/2023 1731   APPEARANCEUR CLEAR 03/07/2023 1731   LABSPEC 1.025 03/07/2023 1731   PHURINE 5.0 03/07/2023 1731   GLUCOSEU NEGATIVE 03/07/2023 1731   HGBUR TRACE (A) 03/07/2023 1731   BILIRUBINUR MODERATE (A) 03/07/2023 1731   KETONESUR 40 (A) 03/07/2023 1731   PROTEINUR 30 (A) 03/07/2023 1731   UROBILINOGEN 0.2 10/15/2006 1244   NITRITE NEGATIVE 03/07/2023 1731   LEUKOCYTESUR NEGATIVE 03/07/2023 1731   Unresulted Labs (From admission, onward)     Start     Ordered   03/07/23 1724  Culture, blood (Routine x 2)  BLOOD CULTURE X 2,   STAT      03/07/23 1723           Medications  vancomycin (VANCOCIN) IVPB 1000 mg/200 mL premix (1,000 mg Intravenous New Bag/Given 03/07/23 2155)  sodium chloride 0.9 % bolus 500 mL (0 mLs Intravenous Stopped 03/07/23 2053)  sodium chloride 0.9 % bolus 500 mL (500 mLs Intravenous New Bag/Given 03/07/23 2155)   Radiological Exams on Admission: DG Chest Port 1 View  Result Date: 03/07/2023 CLINICAL DATA:  Left hip bedsore.  History of a left-sided stroke. EXAM: PORTABLE CHEST 1 VIEW COMPARISON:  07/22/2020.  FINDINGS: Cardiac silhouette normal in size.  No mediastinal or hilar masses. Clear lungs.  No pleural effusion or pneumothorax. Skeletal structures are grossly intact. IMPRESSION: No active disease. Electronically Signed   By: Amie Portland M.D.   On: 03/07/2023 17:51     Data Reviewed: Relevant notes from primary care  and specialist visits, past discharge summaries as available in EHR, including Care Everywhere. Prior diagnostic testing as pertinent to current admission diagnoses Updated medications and problem lists for reconciliation ED course, including vitals, labs, imaging, treatment and response to treatment Triage notes, nursing and pharmacy notes and ED provider's notes Notable results as noted in HPI  Assessment & Plan Sacral wound Will continue with broad-spectrum IV antibiotics with vancomycin along with cefepime coverage  as patient is a diabetic will get pharmacy consults for both.  Every 2 hours turn.  Wound care team consult.  Additional specialty consults as needed. Type 2 diabetes mellitus with hyperlipidemia (HCC) Glycemic protocol.  Hold patient's insulin regimen from home, hold metformin. COPD (chronic obstructive pulmonary disease) (HCC) Stable and compensated, pulse oximetry with vitals checks as needed albuterol MDI or DuoNeb therapy.  Essential hypertension Vitals:   03/07/23 1722 03/07/23 1900 03/07/23 1912 03/07/23 1930  BP: 99/74 124/77 124/77 122/80   03/07/23 2000 03/07/23 2100 03/07/23 2130  BP: 130/76 139/83 (!) 139/92  Initiate as needed meds if needed.   Wound infection Will collect cultures and follow antibiotics have been initiated already.  Will check imaging and general surgery consult if needed.  DVT prophylaxis:  Heparin   Consults:  orthopedic  Advance Care Planning:    Code Status: Prior   Family Communication:  None   Disposition Plan:  TBD.  Severity of Illness: The appropriate patient status for this patient is INPATIENT.  Inpatient status is judged to be reasonable and necessary in order to provide the required intensity of service to ensure the patient's safety. The patient's presenting symptoms, physical exam findings, and initial radiographic and laboratory data in the context of their chronic comorbidities is felt to place them at high risk for further clinical deterioration. Furthermore, it is not anticipated that the patient will be medically stable for discharge from the hospital within 2 midnights of admission.   * I certify that at the point of admission it is my clinical judgment that the patient will require inpatient hospital care spanning beyond 2 midnights from the point of admission due to high intensity of service, high risk for further deterioration and high frequency of surveillance required.*  Author: Gertha Calkin, MD 03/07/2023 10:45 PM  For on call review [email or web address].

## 2023-03-07 NOTE — Assessment & Plan Note (Addendum)
Will collect cultures and follow antibiotics have been initiated already.  Will check imaging and general surgery consult if needed.
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
## 2023-03-07 NOTE — Assessment & Plan Note (Addendum)
Stable and compensated, pulse oximetry with vitals checks as needed albuterol MDI or DuoNeb therapy.

## 2023-03-07 NOTE — ED Notes (Signed)
L arm is contracted. L leg weak. Hx 4 strokes. ST on monitor.

## 2023-03-07 NOTE — ED Provider Notes (Signed)
[facility] Provider Note    Event Date/Time   First MD Initiated Contact with Patient 03/07/23 1739     (approximate)   History   Wound Check   HPI  Molly [name] is a 77 y.o. female  who presents to the emergency department today because of concern for wound to her left hip. History is primarily obtained from husband at bedside who states patient does have frequent memory issues.  States that he first noticed the wound roughly 1 month ago. Thought it was getting better but started to look worse again over the past few days. The patient states that it is painful. She has not had any measured fevers or nausea or vomiting.       Physical Exam   Triage Vital Signs: ED Triage Vitals  Encounter Vitals Group     BP 03/07/23 1722 99/74     Systolic BP Percentile --      Diastolic BP Percentile --      Pulse Rate 03/07/23 1722 (!) 109     Resp 03/07/23 1722 (!) 22     Temp 03/07/23 1722 99.7 F (37.6 C)     Temp Source 03/07/23 1722 Oral     SpO2 03/07/23 1722 97 %     Weight 03/07/23 1717 180 lb (81.6 kg)     Height 03/07/23 1717 5\' 6"  (1.676 m)     Head Circumference --      Peak Flow --      Pain Score 03/07/23 1717 7     Pain Loc --      Pain Education --      Exclude from Growth Chart --     Most recent vital signs: Vitals:   03/07/23 1722  BP: 99/74  Pulse: (!) 109  Resp: (!) 22  Temp: 99.7 F (37.6 C)  SpO2: 97%   General: Awake, alert, not completely oriented. CV:  Good peripheral perfusion. Tachycardia. Resp:  Normal effort.  Abd:  No distention.  Other:  Erythema and malodor to the left hip around pannus, bedsores to sacral area with surrounding erythema.    ED Results / Procedures / Treatments   Labs (all labs ordered are listed, but only abnormal results are displayed) Labs Reviewed  COMPREHENSIVE METABOLIC PANEL - Abnormal; Notable for the following components:      Result Value   Potassium 3.4 (*)    Chloride 96  (*)    Glucose, Bld 126 (*)    BUN 24 (*)    Albumin 3.4 (*)    Total Bilirubin 1.6 (*)    All other components within normal limits  CBC WITH DIFFERENTIAL/PLATELET - Abnormal; Notable for the following components:   RBC 5.31 (*)    All other components within normal limits  URINALYSIS, W/ REFLEX TO CULTURE (INFECTION SUSPECTED) - Abnormal; Notable for the following components:   Hgb urine dipstick TRACE (*)    Bilirubin Urine MODERATE (*)    Ketones, ur 40 (*)    Protein, ur 30 (*)    All other components within normal limits  CULTURE, BLOOD (ROUTINE X 2)  CULTURE, BLOOD (ROUTINE X 2)  LACTIC ACID, PLASMA  PROTIME-INR  CBC WITH DIFFERENTIAL/PLATELET  BASIC METABOLIC PANEL  LACTIC ACID, PLASMA     EKG  None   RADIOLOGY I independently interpreted and visualized the CXR. My interpretation: No pneumonia Radiology interpretation:  IMPRESSION:  No active disease.      PROCEDURES:  Critical  Care performed: No    MEDICATIONS ORDERED IN ED: Medications - No data to display   IMPRESSION / MDM / ASSESSMENT AND PLAN / ED COURSE  I reviewed the triage vital signs and the nursing notes.                              Differential diagnosis includes, but is not limited to, cellulitis, UTI  Patient's presentation is most consistent with acute presentation with potential threat to life or bodily function.   The patient is on the cardiac monitor to evaluate for evidence of arrhythmia and/or significant heart rate changes.  Patient presented to the emergency department today with concerns for wound to her left hip.  On exam patient does have erythema to that area.  Additionally she has sacral ulcers.  Patient was found to be tachycardic here.  No significant leukocytosis on blood work.  I do have concerns for cellulitis.  Additionally I will have concerns for patient's living condition.  Will start IV antibiotics.  Discussed with Dr. Allena Katz with hospitalist service who will  evaluate for admission.      FINAL CLINICAL IMPRESSION(S) / ED DIAGNOSES   Final diagnoses:  Cellulitis, unspecified cellulitis site     Note:  This document was prepared using Dragon voice recognition software and may include unintentional dictation errors.    Phineas Semen, MD 03/07/23 2312

## 2023-03-07 NOTE — ED Notes (Signed)
This RN attempted to have pt use the bedpan to obtain urine sample. Pt unable to tell when she has to void. MD made aware.

## 2023-03-07 NOTE — Assessment & Plan Note (Addendum)
Vitals:   03/07/23 1722 03/07/23 1900 03/07/23 1912 03/07/23 1930  BP: 99/74 124/77 124/77 122/80   03/07/23 2000 03/07/23 2100 03/07/23 2130  BP: 130/76 139/83 (!) 139/92  Initiate as needed meds if needed.

## 2023-03-07 NOTE — Assessment & Plan Note (Addendum)
Glycemic protocol.  Hold patient's insulin regimen from home, hold metformin.

## 2023-03-07 NOTE — ED Notes (Signed)
Patient resting comfortably. Call bell within reach. No needs at this time.

## 2023-03-08 ENCOUNTER — Inpatient Hospital Stay: Payer: Medicare HMO

## 2023-03-08 ENCOUNTER — Other Ambulatory Visit: Payer: Self-pay

## 2023-03-08 DIAGNOSIS — S31000D Unspecified open wound of lower back and pelvis without penetration into retroperitoneum, subsequent encounter: Secondary | ICD-10-CM

## 2023-03-08 DIAGNOSIS — G9341 Metabolic encephalopathy: Secondary | ICD-10-CM | POA: Diagnosis not present

## 2023-03-08 LAB — BASIC METABOLIC PANEL
Anion gap: 12 (ref 5–15)
BUN: 20 mg/dL (ref 8–23)
CO2: 20 mmol/L — ABNORMAL LOW (ref 22–32)
Calcium: 8.6 mg/dL — ABNORMAL LOW (ref 8.9–10.3)
Chloride: 104 mmol/L (ref 98–111)
Creatinine, Ser: 0.64 mg/dL (ref 0.44–1.00)
GFR, Estimated: >60 mL/min (ref >60)
Glucose, Bld: 125 mg/dL — ABNORMAL HIGH (ref 70–99)
Potassium: 4.1 mmol/L (ref 3.5–5.1)
Sodium: 136 mmol/L (ref 135–145)

## 2023-03-08 LAB — RAPID HIV SCREEN (HIV 1/2 AB+AG)
HIV 1/2 Antibodies: NONREACTIVE
HIV-1 P24 Antigen - HIV24: NONREACTIVE

## 2023-03-08 LAB — CBC WITH DIFFERENTIAL/PLATELET
Abs Immature Granulocytes: 0.02 10*3/uL (ref 0.00–0.07)
Basophils Absolute: 0 10*3/uL (ref 0.0–0.1)
Basophils Relative: 0 %
Eosinophils Absolute: 0 10*3/uL (ref 0.0–0.5)
Eosinophils Relative: 1 %
HCT: 37 % (ref 36.0–46.0)
Hemoglobin: 12 g/dL (ref 12.0–15.0)
Immature Granulocytes: 0 %
Lymphocytes Relative: 26 %
Lymphs Abs: 1.4 10*3/uL (ref 0.7–4.0)
MCH: 26.3 pg (ref 26.0–34.0)
MCHC: 32.4 g/dL (ref 30.0–36.0)
MCV: 81 fL (ref 80.0–100.0)
Monocytes Absolute: 0.5 10*3/uL (ref 0.1–1.0)
Monocytes Relative: 9 %
Neutro Abs: 3.5 10*3/uL (ref 1.7–7.7)
Neutrophils Relative %: 64 %
Platelets: 246 10*3/uL (ref 150–400)
RBC: 4.57 MIL/uL (ref 3.87–5.11)
RDW: 14.3 % (ref 11.5–15.5)
WBC: 5.5 10*3/uL (ref 4.0–10.5)
nRBC: 0 % (ref 0.0–0.2)

## 2023-03-08 LAB — TSH: TSH: <0.01 u[IU]/mL — ABNORMAL LOW (ref 0.350–4.500)

## 2023-03-08 LAB — VITAMIN B12: Vitamin B-12: 857 pg/mL (ref 180–914)

## 2023-03-08 LAB — LACTIC ACID, PLASMA: Lactic Acid, Venous: 1.9 mmol/L (ref 0.5–1.9)

## 2023-03-08 MED ORDER — POTASSIUM CHLORIDE 10 MEQ/100ML IV SOLN
10.0000 meq | Freq: Once | INTRAVENOUS | Status: AC
  Administered 2023-03-08: 10 meq via INTRAVENOUS
  Filled 2023-03-08: qty 100

## 2023-03-08 MED ORDER — NYSTATIN 100000 UNIT/GM EX POWD
Freq: Three times a day (TID) | CUTANEOUS | Status: DC
  Filled 2023-03-08 (×3): qty 15

## 2023-03-08 MED ORDER — LORAZEPAM BOLUS VIA INFUSION: 1.0000 mg | Freq: Once | INTRAVENOUS | Status: DC | PRN

## 2023-03-08 MED ORDER — LORAZEPAM 2 MG/ML IJ SOLN: 1.0000 mg | Freq: Once | INTRAMUSCULAR | Status: DC | PRN

## 2023-03-08 MED ORDER — TRAZODONE HCL 50 MG PO TABS
50.0000 mg | ORAL_TABLET | Freq: Once | ORAL | Status: AC
  Administered 2023-03-08: 50 mg via ORAL
  Filled 2023-03-08: qty 1

## 2023-03-08 MED ORDER — GERHARDT'S BUTT CREAM
TOPICAL_CREAM | Freq: Two times a day (BID) | CUTANEOUS | Status: DC
  Filled 2023-03-08: qty 60

## 2023-03-08 MED ORDER — VANCOMYCIN HCL IN DEXTROSE 1-5 GM/200ML-% IV SOLN
1000.0000 mg | INTRAVENOUS | Status: DC
  Administered 2023-03-08: 1000 mg via INTRAVENOUS
  Filled 2023-03-08: qty 200

## 2023-03-08 MED ORDER — VANCOMYCIN HCL 500 MG/100ML IV SOLN
500.0000 mg | INTRAVENOUS | Status: DC
  Administered 2023-03-08: 500 mg via INTRAVENOUS
  Filled 2023-03-08: qty 100

## 2023-03-08 NOTE — ED Notes (Signed)
Patient cleaned up after a bowel movement. Patient had a small amount of brown, soft stool in brief. Patient's soiled sacrum foam was disposed of and peri care was provided. New sacrum foam was applied and patient was placed in a new brief. Gerhardt's cream applied at this time. See MAR.

## 2023-03-08 NOTE — ED Notes (Signed)
This RN and Brayton Caves cleaned patient up at this time. Patient had a small bowel movement in brief. Stool was brown and soft. Patient's sacrum foam was changed and barrier cream was placed on patient's sacrum. Patient was placed in a new brief and paper pads were placed under the patient. The patient was turned on her right side and pillows were placed under all extremities to prevent sores. Patient covered up with warm blankets and bed returned to lowest position.

## 2023-03-08 NOTE — ED Notes (Signed)
Pt given sandwich tray and beverage. Tray set up for pt. Pt is eating.

## 2023-03-08 NOTE — ED Notes (Signed)
Patient fed breakfast tray by Fatima Blank, NT. Patient ate 100% of tray.

## 2023-03-08 NOTE — ED Notes (Signed)
Patient d/c IV placed in hand. Cefepime finished.

## 2023-03-08 NOTE — Progress Notes (Signed)
Pharmacy Antibiotic Note  Molly [name] is a 77 y.o. female admitted on 03/07/2023 with  wound infection .  Pharmacy has been consulted for Vancomycin, Cefepime dosing.  Plan: Cefepime 2 gm IV Q8H ordered to start on 12/9 @ 0000.  Vancomycin 2 gm IV X 1 given in ED on 12/8 @ 2155. Vancomycin 1500 mg IV Q24H ordered to start on 12/9 @ 2200.  AUC = 509 Vanc trough = 11.8   Height: 5\' 6"  (167.6 cm) Weight: 81.6 kg (180 lb) IBW/kg (Calculated) : 59.3  Temp (24hrs), Avg:98.8 F (37.1 C), Min:97.8 F (36.6 C), Max:99.7 F (37.6 C)  Recent Labs  Lab 03/07/23 1729  WBC 7.1  CREATININE 0.60  LATICACIDVEN 1.7    Estimated Creatinine Clearance: 63.4 mL/min (by C-G formula based on SCr of 0.6 mg/dL).    No Known Allergies  Antimicrobials this admission:   >>    >>   Dose adjustments this admission:   Microbiology results:  BCx:   UCx:    Sputum:    MRSA PCR:   Thank you for allowing pharmacy to be a part of this patient's care.  Janie Capp D 03/08/2023 2:16 AM

## 2023-03-08 NOTE — TOC Initial Note (Signed)
Transition of Care [facility]) - Initial/Assessment Note    Patient Details  Name: Molly [name] MRN: [id number] Date of Birth: 25-Nov-1945  Transition of Care [facility]) CM/SW Contact:    Marquita Palms, LCSW Phone Number: 03/08/2023, 4:47 PM  Clinical Narrative:                  CSW spoke with brother and met with patient bedside. Patients brother confirms home is not clean, but comfortable with her going home after wound care. Patients husband not answering phone. CSW confirmed pharmacy and PCP needs met. Patients brother reports he helps her get in the car [place] to appointments. Patient being admitted. CSW unable to understand patient at bedside. Patient will need continued medical work up.       Patient Goals and CMS Choice            Expected Discharge Plan and Services                                              Prior Living Arrangements/Services                       Activities of Daily Living      Permission Sought/Granted                  Emotional Assessment              Admission diagnosis:  Sacral decubitus ulcer [L89.159] Patient Active Problem List   Diagnosis Date Noted   Sacral decubitus ulcer 03/07/2023   Sacral wound 03/26/2021   Wound infection 03/26/2021   UTI (urinary tract infection) 03/26/2021   Microcytic anemia 03/26/2021   Constipation 07/24/2020   Megacolon 07/22/2020   Obesity, diabetes, and hypertension syndrome (HCC)    Vaginal bleeding    Acute urinary retention    Acute metabolic encephalopathy    Rectal bleeding    Weakness    Lethargy    History of completed stroke    GI bleeding 12/27/2019   Lower GI bleed 12/27/2019   C. 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   Cellulitis of left lower extremity 11/19/2014   COPD (chronic obstructive pulmonary disease) (HCC) 11/19/2014   Essential hypertension 11/19/2014   HLD (hyperlipidemia) 11/19/2014   Sepsis (HCC) 11/19/2014   Type 2 diabetes mellitus with hyperlipidemia (HCC) 05/21/2014   Chronic anemia 05/21/2014   PCP:  Inc, [employer] Pharmacy:   [facility] [place] - [address] [place] [address] Phone: [phone] Fax: [phone]  [facility] [address] [place] - [address] [place] [address] Phone: [phone] Fax: [phone]     Social Determinants of Health (SDOH) Social History: SDOH Screenings   Tobacco Use: Medium Risk (06/30/2021)   SDOH Interventions:     Readmission Risk Interventions     No data to display

## 2023-03-08 NOTE — Evaluation (Signed)
Occupational Therapy Evaluation Patient Details Name: Molly [name] MRN: [id number] DOB: 19-May-1945 Today's Date: 03/08/2023   History of Present Illness 77 y.o. female with medical history significant for hyperlipidemia, depression, anxiety, CVA with left-sided residual weakness, insulin-dependent mellitus, dementia, history hypertension, GERD, presents to the emergency department for concerns of decubitus ulcer.   Clinical Impression   Pt was seen for OT evaluation this date. Pt pleasant, oriented to self only, and demonstrating difficulty with expressive language throughout session. Pt reporting we are currently in [place] which is also where she lives, reporting she typically walks to the bathroom. No caregivers present to verify but based on pt's current deficits and functional impairments, suspect pt required significant assist for mobility and ADL recently. Pt presents to acute OT demonstrating impaired ADL performance and functional mobility 2/2 decreased strength, ROM, and coordination with hx of L hemi CVA, decreased strength in general, impaired cognition, very poor balance with L lateral lean requiring MAX A to prevent LOB, and LLE pain limiting bed mobility attempts (See OT problem list for additional functional deficits). Pt currently requires MAX A +2 for bed mobility and repositioning in bed. Anticipate TOTAL A for LB ADL and toileting from bed level. MAX A for UB dressing. Pt may benefit from skilled OT services to address noted impairments and functional limitations (see below for any additional details) in order to maximize safety and independence while minimizing falls risk and caregiver burden.     If plan is discharge home, recommend the following: Two people to help with walking and/or transfers;Two people to help with bathing/dressing/bathroom;Direct supervision/assist for medications management;Supervision due to cognitive status;Direct supervision/assist for financial  management;Assist for transportation;Assistance with cooking/housework;Assistance with feeding;Help with stairs or ramp for entrance    Functional Status Assessment  Patient has had a recent decline in their functional status and demonstrates the ability to make significant improvements in function in a reasonable and predictable amount of time.  Equipment Recommendations  Other (comment) (defer to next venue)    Recommendations for Other Services       Precautions / Restrictions Precautions Precautions: Fall Restrictions Weight Bearing Restrictions: No      Mobility Bed Mobility Overal bed mobility: Needs Assistance Bed Mobility: Supine to Sit, Sit to Supine     Supine to sit: Max assist, +2 for physical assistance Sit to supine: Max assist, +2 for physical assistance        Transfers                   General transfer comment: unsafe to attempt      Balance Overall balance assessment: Needs assistance Sitting-balance support: Feet unsupported, Single extremity supported Sitting balance-Leahy Scale: Zero Sitting balance - Comments: maxA pt requesting to return to supine as soon as she sat up                                   ADL either performed or assessed with clinical judgement   ADL Overall ADL's : Needs assistance/impaired                                       General ADL Comments: Pt currently requires MAX A for bathing, dressing, and toileting tasks from bed level. Anticipate pt requires at least MIN A for self feeding and grooming from bed level.  Vision         Perception         Praxis         Pertinent Vitals/Pain Pain Assessment Pain Assessment: Faces Faces Pain Scale: Hurts worst Pain Location: LLE with any movement Pain Descriptors / Indicators: Grimacing, Guarding, Moaning Pain Intervention(s): Monitored during session, Repositioned, Limited activity within patient's tolerance      Extremity/Trunk Assessment Upper Extremity Assessment Upper Extremity Assessment: Generalized weakness;LUE deficits/detail LUE Deficits / Details: hx CVA affecting L side, increased tone throughout, very limited AROM with contractures LUE Coordination: decreased gross motor;decreased fine motor   Lower Extremity Assessment Lower Extremity Assessment: Defer to PT evaluation RLE Deficits / Details: noted for pt PF contracture LLE Deficits / Details: noted for significant pain with mobility, unable to bed knee without assist, noted for pt PF contracture       Communication Communication Communication: Difficulty following commands/understanding;Difficulty communicating thoughts/reduced clarity of speech Following commands: Follows one step commands with increased time Cueing Techniques: Verbal cues;Tactile cues   Cognition Arousal: Alert Behavior During Therapy: WFL for tasks assessed/performed Overall Cognitive Status: No family/caregiver present to determine baseline cognitive functioning                                 General Comments: oriented to self only, requires cues to follow simple commands     General Comments       Exercises     Shoulder Instructions      Home Living                                   Additional Comments: pt unable to provide PLOF. did have conflicting reports of mobility but stated she gets up and walks      Prior Functioning/Environment Prior Level of Function : Patient poor historian/Family not available                        OT Problem List: Decreased strength;Decreased coordination;Pain;Decreased cognition;Decreased range of motion;Decreased activity tolerance;Impaired tone;Decreased safety awareness;Decreased knowledge of use of DME or AE;Impaired balance (sitting and/or standing);Impaired UE functional use      OT Treatment/Interventions: Self-care/ADL training;Therapeutic  exercise;Therapeutic activities;Cognitive remediation/compensation;Neuromuscular education;DME and/or AE instruction;Patient/family education;Balance training    OT Goals(Current goals can be found in the care plan section) Acute Rehab OT Goals Patient Stated Goal: have less pain OT Goal Formulation: With patient Time For Goal Achievement: 03/22/23 Potential to Achieve Goals: Fair ADL Goals Pt Will Perform Eating: with set-up;with supervision;sitting (supported sitting) Pt Will Perform Grooming: sitting;with min assist Additional ADL Goal #1: Pt will complete bed mobility requiring MAX A +1 to improve participation in ADL and repositioning to maximize safety and skin/joint protection, 3/3 opportunities.  OT Frequency: Min 1X/week    Co-evaluation PT/OT/SLP Co-Evaluation/Treatment: Yes Reason for Co-Treatment: For patient/therapist safety;To address functional/ADL transfers PT goals addressed during session: Mobility/safety with mobility;Balance OT goals addressed during session: ADL's and self-care      AM-PAC OT "6 Clicks" Daily Activity     Outcome Measure Help from another person eating meals?: A Little Help from another person taking care of personal grooming?: A Little Help from another person toileting, which includes using toliet, bedpan, or urinal?: Total Help from another person bathing (including washing, rinsing, drying)?: Total Help from another person to put on and  taking off regular upper body clothing?: A Lot Help from another person to put on and taking off regular lower body clothing?: Total 6 Click Score: 11   End of Session    Activity Tolerance: Patient limited by pain Patient left: in bed;with call bell/phone within reach;with bed alarm set  OT Visit Diagnosis: Other abnormalities of gait and mobility (R26.89);Other symptoms and signs involving cognitive function                Time: 0981-1914 OT Time Calculation (min): 13 min Charges:  OT General  Charges $OT Visit: 1 Visit OT Evaluation $OT Eval Moderate Complexity: 1 Mod  Arman Filter., MPH, MS, OTR/L ascom [phone] 03/08/23, 4:29 PM

## 2023-03-08 NOTE — ED Notes (Signed)
Patient continues to try to take off gown and pull on wires in bed. Patient d/c at 63. New IV placed at [phone]. Mitts placed on patient and patient informed not to pull on IV. Patient's bed returned to lowest position.

## 2023-03-08 NOTE — ED Notes (Signed)
Report called to Flex RN, Dorathy Daft. RN told that pt had brief and gown freshly changed, that pt was alert and oriented x1, and that pt had one IV. Discussed Hx, Dx, and Sx.

## 2023-03-08 NOTE — ED Notes (Signed)
Called 1A to have staff please look at chart so patient can transfer to floor.

## 2023-03-08 NOTE — Progress Notes (Addendum)
PROGRESS NOTE  Molly [name]    DOB: May 22, 1945, 77 y.o.  HQI:[id number]    Code Status: Full Code   DOA: 03/07/2023   LOS: 1   Brief hospital course  Molly [name] is a 77 y.o. female with medical history significant for diabetes mellitus type 2, chronic anemia, cellulitis, COPD, hypertension, history of strokes, history of GI bleeds, history of C. difficile colitis, sacral decub. Presented to the ED from home due to concern for her decubitus ulcer.  Further history difficult at this time as she is disoriented.   In the ED, it was found that they had stable vitals with mild tachycardia in low 100s.  Significant findings included LA 1.7, unremarkable BMP, and CBC.urinalysis consistent with dehydration.  They were initially treated with broad spectrum Abx, IV fluids.   Patient was admitted to medicine service for further workup and management of sacral wound, FTT as outlined in detail below.  03/08/23 -hemodynamically stable. AMS  Assessment & Plan  Principal Problem:   Sacral wound Active Problems:   Type 2 diabetes mellitus with hyperlipidemia (HCC)   COPD (chronic obstructive pulmonary disease) (HCC)   Essential hypertension   Wound infection   Sacral decubitus ulcer  Acute metabolic encephalopathy  delirium- likely related to wound, infection - treating underlying causes - head CT to r/o acute stroke. Has remote h/o CVA  Sacral decubitus wound POA- not staged. Will need re-evaluation after removed from hallway. MRI on previous admission was negative for osteomyelitis and wound cultures were negative at that time.  [facility] consulted - analgesia PRN - continue Abx for possible skin source - collect wound cultures - f/u BxCx - possible surgical consult for debridement  Type II DM-  - monitor blood sugars.   Failure to thrive- several skin break downs, several ED visits and hospitalizations in the past year. Appears disheveled, significant weight loss in comparison to  her photo ID - TOC consult - PT/OT - SLP - dietician  - TSH, HIV, RPR, anemia panel - consider appetite stimulant  - routine cancer screenings outpatient  - palliative care consult  COPD- not in acute flare  Body mass index is 29.05 kg/m.  VTE ppx: heparin injection 5,000 Units Start: 03/07/23 2300   Diet:     Diet   Diet heart healthy/carb modified Room service appropriate? Yes; Fluid consistency: Thin   Consultants: None   Subjective 03/08/23    Pt reports no complaints. She is continuously trying to undress and pull at lines throughout encounter and responds to questions with unrelated information.    Objective   Vitals:   03/08/23 0000 03/08/23 0000 03/08/23 0030 03/08/23 0100  BP: 127/80 127/80 (!) 145/89 125/88  Pulse: (!) 103 (!) 105 (!) 120 (!) 107  Resp: 13 (!) 21 (!) 21 (!) 21  Temp:  98.8 F (37.1 C)    TempSrc:  Oral    SpO2:   96%   Weight:      Height:        Intake/Output Summary (Last 24 hours) at 03/08/2023 0805 Last data filed at 03/08/2023 0534 Gross per 24 hour  Intake 103.33 ml  Output --  Net 103.33 ml   Filed Weights   03/07/23 1717  Weight: 81.6 kg    Physical Exam:  General: awake, alert, mild distress HEENT: atraumatic, clear conjunctiva, anicteric sclera, MMM, hearing grossly normal Respiratory: normal respiratory effort. Cardiovascular: quick capillary refill, normal S1/S2, RRR, no JVD, murmurs Gastrointestinal: soft, NT, ND  Nervous: A&O x3. no gross focal neurologic deficits, normal speech Extremities: moves all equally, no edema, normal tone Skin: dry, several superficial lesions. Pending photos when arrives in a room Psychiatry: confused, agitated  Labs   I have personally reviewed the following labs and imaging studies CBC    Component Value Date/Time   WBC 7.1 03/07/2023 1729   RBC 5.31 (H) 03/07/2023 1729   HGB 13.8 03/07/2023 1729   HCT 43.6 03/07/2023 1729   PLT 316 03/07/2023 1729   MCV 82.1 03/07/2023  1729   MCH 26.0 03/07/2023 1729   MCHC 31.7 03/07/2023 1729   RDW 14.3 03/07/2023 1729   LYMPHSABS 1.4 03/07/2023 1729   MONOABS 0.5 03/07/2023 1729   EOSABS 0.1 03/07/2023 1729   BASOSABS 0.0 03/07/2023 1729      Latest Ref Rng & Units 03/07/2023    5:29 PM 04/06/2021    6:52 PM 03/29/2021    4:50 AM  BMP  Glucose 70 - 99 mg/dL 161  096  045   BUN 8 - 23 mg/dL 24  22  12    Creatinine 0.44 - 1.00 mg/dL 4.09  8.11  9.14   Sodium 135 - 145 mmol/L 136  134  135   Potassium 3.5 - 5.1 mmol/L 3.4  3.1  3.2   Chloride 98 - 111 mmol/L 96  99  104   CO2 22 - 32 mmol/L 25  28  26    Calcium 8.9 - 10.3 mg/dL 9.1  8.8  9.2     DG Chest Port 1 View  Result Date: 03/07/2023 CLINICAL DATA:  Left hip bedsore.  History of a left-sided stroke. EXAM: PORTABLE CHEST 1 VIEW COMPARISON:  07/22/2020. FINDINGS: Cardiac silhouette normal in size.  No mediastinal or hilar masses. Clear lungs.  No pleural effusion or pneumothorax. Skeletal structures are grossly intact. IMPRESSION: No active disease. Electronically Signed   By: Amie Portland M.D.   On: 03/07/2023 17:51    Disposition Plan & Communication  Patient status: Inpatient  Admitted From: Home Planned disposition location: likely SNF Anticipated discharge date: TBD pending clinical stabilization   Family Communication: none at bedside    Author: Leeroy Bock, DO [facility] 03/08/2023, 8:05 AM   Available by Epic secure chat 7AM-7PM. If 7PM-7AM, please contact night-coverage.  TR[facility] contact information found on [email or web address].

## 2023-03-08 NOTE — ED Notes (Signed)
Changed patient--small amount soft brown stool in diaper.  Cleaned and dried.  Has multiple areas of open skin on buttocks.  Some areas with scabbed over.  Merepex dressing applied.  She also has multiple open areas in groin that appear abraided.  She is not coopertive with turning.  She only wants to be on left side and goes back to that as soon as we let go.

## 2023-03-08 NOTE — Evaluation (Signed)
Physical Therapy Evaluation Patient Details Name: Molly [name] MRN: [id number] DOB: [birth year] Today's Date: 03/08/2023  History of Present Illness  77 y.o. female with medical history significant for hyperlipidemia, depression, anxiety, CVA with left-sided residual weakness, insulin-dependent mellitus, dementia, history hypertension, GERD, presents to the emergency department for concerns of decubitus ulcer.   Clinical Impression  Pt alert, oriented to self only. Pt unable to provide accurate PLOF but stated she is able to walk. Pt noted for LUE/LLE weakness and increased tone. Pt with complaints of LLE pain throughout mobility attempts. Supine to sit maxAx2 and maxA to maintain sitting throughout due to L lateral lean. Returned to sidelying and repositioned as able for comfort.  Overall the patient demonstrated deficits (see "PT Problem List") that impede the patient's functional abilities, safety, and mobility and would benefit from skilled PT intervention pending pts ability to participate with therapy.        If plan is discharge home, recommend the following: Two people to help with walking and/or transfers;Two people to help with bathing/dressing/bathroom;Help with stairs or ramp for entrance;Assist for transportation;Assistance with cooking/housework;Supervision due to cognitive status;Assistance with feeding;Direct supervision/assist for medications management   Can travel by private vehicle   No    Equipment Recommendations Other (comment) (TBD)  Recommendations for Other Services       Functional Status Assessment Patient has had a recent decline in their functional status and demonstrates the ability to make significant improvements in function in a reasonable and predictable amount of time.     Precautions / Restrictions Precautions Precautions: Fall Restrictions Weight Bearing Restrictions: No      Mobility  Bed Mobility Overal bed mobility: Needs Assistance Bed  Mobility: Supine to Sit, Sit to Supine     Supine to sit: Max assist, +2 for physical assistance Sit to supine: Max assist, +2 for physical assistance        Transfers                        Ambulation/Gait                  Stairs            Wheelchair Mobility     Tilt Bed    Modified Rankin (Stroke Patients Only)       Balance Overall balance assessment: Needs assistance Sitting-balance support: Feet unsupported Sitting balance-Leahy Scale: Zero Sitting balance - Comments: maxA pt requesting to return to supine as soon as she sat up                                     Pertinent Vitals/Pain Pain Assessment Pain Assessment: Faces Faces Pain Scale: Hurts worst    Home Living                     Additional Comments: pt unable to provide PLOF. did have conflicting reports of mobility but stated she gets up and walks    Prior Function Prior Level of Function : Patient poor historian/Family not available                     Extremity/Trunk Assessment   Upper Extremity Assessment Upper Extremity Assessment: Defer to OT evaluation    Lower Extremity Assessment Lower Extremity Assessment: RLE deficits/detail;LLE deficits/detail RLE Deficits / Details: noted for pt PF contracture LLE Deficits /  Details: noted for significant pain with mobility, unable to bed knee without assist, noted for pt PF contracture       Communication      Cognition Arousal: Alert   Overall Cognitive Status: No family/caregiver present to determine baseline cognitive functioning                                 General Comments: oriented to self only        General Comments      Exercises     Assessment/Plan    PT Assessment Patient needs continued PT services  PT Problem List Decreased strength;Decreased range of motion;Decreased activity tolerance;Decreased knowledge of precautions;Decreased  balance;Decreased mobility;Decreased coordination       PT Treatment Interventions DME instruction;Neuromuscular re-education;Gait training;Stair training;Patient/family education;Functional mobility training;Therapeutic activities;Therapeutic exercise;Balance training    PT Goals (Current goals can be found in the Care Plan section)  Acute Rehab PT Goals PT Goal Formulation: Patient unable to participate in goal setting Time For Goal Achievement: 03/22/23 Potential to Achieve Goals: Good    Frequency Min 1X/week     Co-evaluation               AM-PAC PT "6 Clicks" Mobility  Outcome Measure Help needed turning from your back to your side while in a flat bed without using bedrails?: Total Help needed moving from lying on your back to sitting on the side of a flat bed without using bedrails?: Total Help needed moving to and from a bed to a chair (including a wheelchair)?: Total Help needed standing up from a chair using your arms (e.g., wheelchair or bedside chair)?: Total Help needed to walk in hospital room?: Total Help needed climbing 3-5 steps with a railing? : Total 6 Click Score: 6    End of Session   Activity Tolerance: Patient limited by pain Patient left: in bed;with call bell/phone within reach;with bed alarm set Nurse Communication: Mobility status PT Visit Diagnosis: Other abnormalities of gait and mobility (R26.89);Muscle weakness (generalized) (M62.81);Difficulty in walking, not elsewhere classified (R26.2);Pain Pain - Right/Left:  (L arm/leg)    Time: 0623-7628 PT Time Calculation (min) (ACUTE ONLY): 13 min   Charges:   PT Evaluation $PT Eval Moderate Complexity: 1 Mod   PT General Charges $$ ACUTE PT VISIT: 1 Visit         Olga Coaster PT, DPT 4:12 PM,03/08/23

## 2023-03-09 ENCOUNTER — Inpatient Hospital Stay: Payer: Medicare HMO

## 2023-03-09 ENCOUNTER — Encounter: Payer: Self-pay | Admitting: Internal Medicine

## 2023-03-09 DIAGNOSIS — T148XXA Other injury of unspecified body region, initial encounter: Secondary | ICD-10-CM | POA: Diagnosis not present

## 2023-03-09 DIAGNOSIS — S31000D Unspecified open wound of lower back and pelvis without penetration into retroperitoneum, subsequent encounter: Secondary | ICD-10-CM | POA: Diagnosis not present

## 2023-03-09 DIAGNOSIS — F03B0 Unspecified dementia, moderate, without behavioral disturbance, psychotic disturbance, mood disturbance, and anxiety: Secondary | ICD-10-CM

## 2023-03-09 DIAGNOSIS — G9341 Metabolic encephalopathy: Secondary | ICD-10-CM | POA: Diagnosis not present

## 2023-03-09 DIAGNOSIS — E440 Moderate protein-calorie malnutrition: Secondary | ICD-10-CM | POA: Insufficient documentation

## 2023-03-09 DIAGNOSIS — Z515 Encounter for palliative care: Secondary | ICD-10-CM | POA: Diagnosis not present

## 2023-03-09 DIAGNOSIS — Z7189 Other specified counseling: Secondary | ICD-10-CM | POA: Diagnosis not present

## 2023-03-09 DIAGNOSIS — L089 Local infection of the skin and subcutaneous tissue, unspecified: Secondary | ICD-10-CM

## 2023-03-09 LAB — GLUCOSE, CAPILLARY: Glucose-Capillary: 155 mg/dL — ABNORMAL HIGH (ref 70–99)

## 2023-03-09 LAB — T4, FREE: Free T4: 1.42 ng/dL — ABNORMAL HIGH (ref 0.61–1.12)

## 2023-03-09 LAB — RPR: RPR Ser Ql: NONREACTIVE

## 2023-03-09 MED ORDER — VITAMIN C 500 MG PO TABS
500.0000 mg | ORAL_TABLET | Freq: Two times a day (BID) | ORAL | Status: DC
  Administered 2023-03-09 – 2023-03-11 (×5): 500 mg via ORAL
  Filled 2023-03-09 (×5): qty 1

## 2023-03-09 MED ORDER — ENSURE ENLIVE PO LIQD
237.0000 mL | Freq: Three times a day (TID) | ORAL | Status: DC
  Administered 2023-03-09 – 2023-03-11 (×7): 237 mL via ORAL

## 2023-03-09 MED ORDER — ADULT MULTIVITAMIN W/MINERALS CH
1.0000 | ORAL_TABLET | Freq: Every day | ORAL | Status: DC
  Administered 2023-03-09 – 2023-03-11 (×3): 1 via ORAL
  Filled 2023-03-09 (×3): qty 1

## 2023-03-09 MED ORDER — MEDIHONEY WOUND/BURN DRESSING EX PSTE
1.0000 | PASTE | Freq: Every day | CUTANEOUS | Status: DC
  Administered 2023-03-10 – 2023-03-11 (×2): 1 via TOPICAL
  Filled 2023-03-09 (×2): qty 44

## 2023-03-09 MED ORDER — ZINC SULFATE 220 (50 ZN) MG PO CAPS
220.0000 mg | ORAL_CAPSULE | Freq: Every day | ORAL | Status: DC
  Administered 2023-03-09 – 2023-03-11 (×3): 220 mg via ORAL
  Filled 2023-03-09 (×3): qty 1

## 2023-03-09 NOTE — TOC Progression Note (Signed)
Transition of Care [facility]) - Progression Note    Patient Details  Name: Molly [name] MRN: [id number] Date of Birth: 1945-09-28  [facility]) CM/SW Contact  Marlowe Sax, RN Phone Number: 03/09/2023, 12:23 PM  Clinical Narrative:     Reached out to the husband and was not able to reach him, mailbox is full and can not leave a VM   Expected Discharge Plan:  (TBD) Barriers to Discharge: Continued Medical Work up  Expected Discharge Plan and Services   Discharge Planning Services: CM Consult   Living arrangements for the past 2 months: Single Family Home                                       Social Determinants of Health (SDOH) Interventions SDOH Screenings   Food Insecurity: Patient Unable To Answer (03/09/2023)  Housing: Patient Unable To Answer (03/09/2023)  Transportation Needs: Patient Unable To Answer (03/09/2023)  Utilities: Patient Unable To Answer (03/09/2023)  Tobacco Use: Medium Risk (06/30/2021)    Readmission Risk Interventions    03/08/2023    4:53 PM  Readmission Risk Prevention Plan  Post Dischage Appt Complete  Medication Screening Complete

## 2023-03-09 NOTE — Progress Notes (Signed)
Initial Nutrition Assessment  DOCUMENTATION CODES:   Non-severe (moderate) malnutrition in context of chronic illness  INTERVENTION:   -Downgrade diet to dysphagia 3 diet for ease of intake  -Ensure Enlive po TID, each supplement provides 350 kcal and 20 grams of protein -Magic cup TID with meals, each supplement provides 290 kcal and 9 grams of protein  -MVI with minerals daily -500 mg vitamin C BID -220 gm zinc sulfate daily x 14 days -Feeding assistance with meals -Check and monitor labs for potential micronutrient deficiencies which may impair optimal wound healing: vitamin A  NUTRITION DIAGNOSIS:   Moderate Malnutrition related to chronic illness (COPD) as evidenced by mild fat depletion, moderate fat depletion, mild muscle depletion, moderate muscle depletion.  GOAL:   Patient will meet greater than or equal to 90% of their needs  MONITOR:   PO intake, Supplement acceptance  REASON FOR ASSESSMENT:   Consult Assessment of nutrition requirement/status  ASSESSMENT:   Pt with medical history significant for diabetes mellitus type 2, chronic anemia, cellulitis, COPD, hypertension, history of strokes, history of GI bleeds, history of C. 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 Pt present for a wound check for her lt hip wound. The wound has been there for several weeks and patient noticed it and said it was getting worse over the past few days PTA.  Pt admitted with sacral wound infection.   Reviewed I/O's: +802 ml x 24 hours and +905 ml since admission  Per [facility] notes, pt with stage 3 pressure injury to sacrum, stage 2 pressure injury to rt medial buttocks, MASD to buttocks, and full thickness wound to rt dorsal foot.   Pt sleeping soundly at time of visit. She did not respond to voice or touch. No family at bedside to provide additional history.    Observed breakfast tray on bedside table, which was untouched. Pt currently on a  heart healthy, carb modified diet.   Due to increased nutritional needs for wound healing and malnutrition, pt would greatly benefit from addition of oral nutrition supplements. Pt with poor oral intake and would benefit from nutrient dense supplement. One Ensure Enlive supplement provides 350 kcals, 20 grams protein, and 44-45 grams of carbohydrate vs one Glucerna shake supplement, which provides 220 kcals, 10 grams of protein, and 26 grams of carbohydrate. Given pt's hx of DM, RD will reassess adequacy of PO intake, CBGS, and adjust supplement regimen as appropriate at follow-up.    No wt loss noted over the past year.   Per TOC notes, plan to discharge home once medically stable.   Palliative care has been consulted for goals of care discussions.   Medications reviewed.   Labs reviewed: CBGS: 152 (inpatient orders for glycemic control are none).    NUTRITION - FOCUSED PHYSICAL EXAM:  Flowsheet Row Most Recent Value  Orbital Region Mild depletion  Upper Arm Region Moderate depletion  Thoracic and Lumbar Region Mild depletion  Buccal Region Moderate depletion  Temple Region Moderate depletion  Clavicle Bone Region No depletion  Clavicle and Acromion Bone Region No depletion  Scapular Bone Region No depletion  Dorsal Hand Moderate depletion  Patellar Region Moderate depletion  Anterior Thigh Region Moderate depletion  Posterior Calf Region Moderate depletion  Edema (RD Assessment) None  Hair Reviewed  Eyes Reviewed  Mouth Reviewed  Skin Reviewed  Nails Reviewed       Diet Order:   Diet Order  Diet heart healthy/carb modified Room service appropriate? Yes; Fluid consistency: Thin  Diet effective now                   EDUCATION NEEDS:   Not appropriate for education at this time  Skin:  Skin Assessment: Skin Integrity Issues: Skin Integrity Issues:: Stage III, Other (Comment), Stage II Stage II: rt medial buttock Stage III: sacrum Other: MASD  buttocks and full thickness wound to rt dorsal foot  Last BM:  03/09/23  Height:   Ht Readings from Last 1 Encounters:  03/07/23 5\' 6"  (1.676 m)    Weight:   Wt Readings from Last 1 Encounters:  03/07/23 81.6 kg    Ideal Body Weight:  59.1 kg  BMI:  Body mass index is 29.05 kg/m.  Estimated Nutritional Needs:   Kcal:  1750-1950  Protein:  90-105 grams  Fluid:  > 1.7 L    Levada Schilling, RD, LDN, CDCES Registered Dietitian III Certified Diabetes Care and Education Specialist Please refer to [facility] for RD and/or RD on-call/weekend/after hours pager

## 2023-03-09 NOTE — Consult Note (Signed)
WOC Nurse Consult Note: Reason for Consult: sacral wound Wound type: 1. Stage 3 Pressure Injury Sacrum 2. Stage 2 PI R medial buttock  3. Moisture Associated Skin Damage buttocks 4.  Full thickness R dorsal foot ? Etiology  Pressure Injury POA: Yes Measurement: 1.  Sacral PI 2 cm x 2 cm x 0.1 cm 80% pink moist 20% tan fibrinous tissue  2.  R medial buttock Stage 2 1 cm x 1 cm x 0.1 cm 100% pink moist  3.  Widespread erythema and scattered partial thickness skin loss to buttocks/coccyx d/t moisture and friction  4.  R dorsal foot full thickness 1 cm x 1 cm 100% yellow dried exudate  ICD-10 CM Codes for Irritant Dermatitis  L24A2 - Due to fecal, urinary or dual incontinence  Wound bed: as above  Drainage (amount, consistency, odor) minimal serosanguinous from buttocks/sacrum; R foot dry  Periwound: mild erythema around R dorsal foot wound  Dressing procedure/placement/frequency:  Clean sacral wound with NS, apply Medihoney to wound bed daily, cover with dry gauze. Coat surrounding skin with Gerhardt's Butt Cream 2 times daily and prn soiling. May cover area with silicone foam or ABD pad whichever is preferred.  Clean R dorsal foot wound with NS, apply small piece of Xeroform gauze to area daily [facility] [place] [phone]) and cover with silicone foam.   POC discussed with bedside nurse. WOC team will not follow. Re-consult if further needs arise.   Thank you,    Priscella Mann MSN, RN-BC, [employer] [phone]

## 2023-03-09 NOTE — Evaluation (Addendum)
Clinical/Bedside Swallow Evaluation Patient Details  Name: Molly [name] MRN: [id number] Date of Birth: 01-May-1945  Today's Date: 03/09/2023 Time: SLP Start Time (ACUTE ONLY): 1020 SLP Stop Time (ACUTE ONLY): 1120 SLP Time Calculation (min) (ACUTE ONLY): 60 min  Past Medical History:  Past Medical History:  Diagnosis Date   Anxiety    COPD (chronic obstructive pulmonary disease) (HCC)    Diabetes (HCC)    Hemorrhoid    HLD (hyperlipidemia)    HTN (hypertension)    Stroke (HCC) 2008   left weakness   Past Surgical History:  Past Surgical History:  Procedure Laterality Date   AMPUTATION Left 05/22/2014   Procedure: AMPUTATION RAY LEFT FOURTH AND FIFTH ;  Surgeon: Toni Arthurs, MD;  Location: [facility] OR;  Service: Orthopedics;  Laterality: Left;   CATARACT EXTRACTION     COLONOSCOPY  2004   COLONOSCOPY WITH PROPOFOL N/A 04/29/2016   Procedure: COLONOSCOPY WITH PROPOFOL;  Surgeon: Wyline Mood, MD;  Location: [facility] ENDOSCOPY;  Service: Gastroenterology;  Laterality: N/A;   ESOPHAGOGASTRODUODENOSCOPY (EGD) WITH PROPOFOL N/A 04/27/2016   Procedure: ESOPHAGOGASTRODUODENOSCOPY (EGD) WITH PROPOFOL;  Surgeon: Midge Minium, MD;  Location: [facility] ENDOSCOPY;  Service: Endoscopy;  Laterality: N/A;   HPI:  Pt is a 77 y.o. female with medical history significant for Dementia per chart, diabetes mellitus type 2, chronic anemia, cellulitis, COPD, hypertension, history of strokes, history of GI bleeds, history of C. difficile colitis, sacral decub secondary to sedentary status, Wound on her left hip, side. Per chart note, the Wound has been there for several weeks and it was noted to be getting worse over the past few days.   CXR: no active disease.  Head CT: No evidence of acute intracranial abnormality.  2. Extensive chronic ischemia with multiple old infarcts.  Pt is Dependent for feeding and ADLs. Family present reported pt may need more care at home now. 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 Any need for f/u re: cognitive-linguistic needs can be done post Acute admit at next venue of care. At Baseline, pt is Dependent for feeding and ADLs and may need a higher level of care(per Family).     Assessment / Plan / Recommendation  Clinical Impression   Pt seen for BSE this morning. Pt awake, verbal to basic questions re: self. Pt did not immediately follow commands. Pt has known Baseline Dementia. Sister arrived during session.  On RA, afebrile. WBC WNL.  OF NOTE: Pt has been seen by ST services several years ago and recommended a dysphagia level 3/2 diet consistency then.   Pt appears to present w/ grossly functional pharyngeal phase swallowing of po trials w/ No pharyngeal phase dysphagia noted, No overt neuromuscular deficits noted. Pt is Impulsive when drinking and benefits from Pacing/pinched straw. Pt consumed po trials w/ No overt, clinical s/s of aspiration during po trials.  Pt appears at reduced risk for aspiration when being fed and when following general aspiration precautions. However, pt does have challenging factors that could impact her oropharyngeal swallowing to include Oral phase Dysphagia -- pt exhibited both oral Prep deficits and oral phase deficits c/b decreased oral awareness, lengthier oral phase time for mastication and oral clearing, vertical/munchy chewing w/ increased textured foods/solids. Pt also required FULL feeding assistance. She exhibited overall weakness and Cognitive decline in her engagement. These factors can increase risk for aspiration, dysphagia as well as decreased oral intake  overall.   During po trials, pt consumed all consistencies w/ no overt coughing, decline in vocal quality, or change in respiratory presentation during/post trials. O2 sats remained 98%. Oral phase and oral prep appeared min disorganized and lengthy in bolus acceptance then management,  mastication, and control of bolus propulsion for A-P transfer and full oral clearing. Oral clearing was aided by alternating foods, then liquids. Full feeding support required.  OM Exam was cursory d/t Cognitive decline but appeared [facility] w/ no unilateral weakness noted. Speech intelligible.   Recommend a Mech Soft consistency diet w/ well-Chopped/Minced meats, moistened foods; Thin liquids -- carefully monitor straw use by Pinching it to limit Impulsive drinking, and pt should help to Hold Cup when drinking if able. Recommend general aspiration precautions, small bites/sips, reduce distractions, full feeding support -- check for oral clearing b/t bites. Pills CRUSHED in Puree for safer, easier swallowing -- encouraged now and for D/C to the Sister.  Education given on Pills in Puree; food consistencies and easy to eat options and prep; general aspiration precautions and feeding support to pt and Sister. Discussion on how Cognitive decline impacts eating/drinking and safety of swallowing.  Per pt's previous BSEs, pt appears close to/at her baseline. MD/NSG to reconsult if any new needs arise during admit. NSG updated, agreed. MD updated. Sister agreed Recommend Dietician and Palliative f/u for support moving forward. SLP Visit Diagnosis: Dysphagia, oral phase (R13.11) (baseline Dementia)    Aspiration Risk  Mild aspiration risk;Risk for inadequate nutrition/hydration (reduced following general precs. and given feeding support)    Diet Recommendation   Thin;Dysphagia 3 (mechanical soft) (meats Chopped/Minced; gravies) = a Mech Soft consistency diet w/ well-Chopped/Minced meats, moistened foods; Thin liquids -- carefully monitor straw use by Pinching it to limit Impulsive drinking, and pt should help to Hold Cup when drinking if able. Recommend general aspiration precautions, small bites/sips, reduce distractions, full feeding support -- check for oral clearing b/t bites.   Medication Administration:  Crushed with puree    Other  Recommendations Recommended Consults:  (Dietician f/u; Palliative Care f/u for GOC) Oral Care Recommendations: Oral care BID;Oral care before and after PO;Staff/trained caregiver to provide oral care Caregiver Recommendations:  (n/a)    Recommendations for follow up therapy are one component of a multi-disciplinary discharge planning process, led by the attending physician.  Recommendations may be updated based on patient status, additional functional criteria and insurance authorization.  Follow up Recommendations Follow physician's recommendations for discharge plan and follow up therapies (next venue of care if further Education needed)      Assistance Recommended at Discharge  FULL  Functional Status Assessment Patient has had a recent decline in their functional status and/or demonstrates limited ability to make significant improvements in function in a reasonable and predictable amount of time  Frequency and Duration  (n/a)   (n/a)       Prognosis Prognosis for improved oropharyngeal function: Fair Barriers to Reach Goals: Cognitive deficits;Language deficits;Time post onset;Severity of deficits;Behavior Barriers/Prognosis Comment: Baseline Dementia; Impulsive drinking behavior; dependent on feeding and positioning upright in bed.      Swallow Study   General Date of Onset: 03/07/23 HPI: Pt is a 77 y.o. female with medical history significant for Dementia per chart, diabetes mellitus type 2, chronic anemia, cellulitis, COPD, hypertension, history of strokes, history of GI bleeds, history of C. 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  The wound has  been there for several weeks and patient noticed it and said it was getting worse over the past few days.  CXR: no active disease.  Head CT: No evidence of acute intracranial abnormality.  2. Extensive chronic ischemia with  multiple old infarcts.  Pt is Dependent for feeding and ADLs. Family reported pt may need more care at home now. Type of Study: Bedside Swallow Evaluation Previous Swallow Assessment: 2017; 2021: mech soft>dys. level 2 diets w thin liquids rec'd Diet Prior to this Study: Regular;Thin liquids (Level 0) (changed to a dys level 3.) Temperature Spikes Noted: No (wbc 5.5) Respiratory Status: Room air History of Recent Intubation: No Behavior/Cognition: Alert;Cooperative;Pleasant mood;Confused;Distractible;Requires cueing;Doesn't follow directions (baseline Dementia) Oral Cavity Assessment: Within Functional Limits Oral Care Completed by SLP:  (attempted but pt bit the swabs) Oral Cavity - Dentition: Adequate natural dentition;Missing dentition (few) Vision:  (n/a - some vision deficit baseline) Self-Feeding Abilities: Total assist Patient Positioning: Upright in bed (total support) Baseline Vocal Quality: Normal;Low vocal intensity Volitional Cough: Cognitively unable to elicit Volitional Swallow: Unable to elicit    Oral/Motor/Sensory Function Overall Oral Motor/Sensory Function: Within functional limits (grossly)   Ice Chips Ice chips: Not tested   Thin Liquid Thin Liquid: Impaired (oral prep and oral phase - min++) Presentation: Cup;Straw (attempted; pinched straw to monitor bolus size for trials worked best) Oral Phase Impairments: Poor awareness of bolus Oral Phase Functional Implications:  (impulsive drinking behavior) Pharyngeal  Phase Impairments:  (no overt)    Nectar Thick Nectar Thick Liquid: Not tested   Honey Thick Honey Thick Liquid: Not tested   Puree Puree: Within functional limits (grossly) Presentation: Spoon (fed; 10++ trials)   Solid     Solid: Impaired Presentation: Spoon (fed; 8-9 trials) Oral Phase Impairments: Poor awareness of bolus (munching chewing, more vertical chewing at times) Oral Phase Functional Implications: Impaired mastication;Prolonged oral transit  (slight+) Pharyngeal Phase Impairments:  (no overt) Other Comments: alternated w/ moist foods         Jerilynn Som, MS, CCC-SLP Speech Language Pathologist Rehab Services; [facility] [phone] (ascom) Xavier Fournier 03/09/2023,3:47 PM

## 2023-03-09 NOTE — Plan of Care (Signed)
  Problem: Education: Goal: Knowledge of General Education information will improve Description: Including pain rating scale, medication(s)/side effects and non-pharmacologic comfort measures Outcome: Progressing   Problem: Health Behavior/Discharge Planning: Goal: Ability to manage health-related needs will improve Outcome: Progressing   Problem: Clinical Measurements: Goal: Ability to maintain clinical measurements within normal limits will improve Outcome: Progressing Goal: Will remain free from infection Outcome: Progressing Goal: Diagnostic test results will improve Outcome: Progressing Goal: Respiratory complications will improve Outcome: Progressing Goal: Cardiovascular complication will be avoided Outcome: Progressing   Problem: Activity: Goal: Risk for activity intolerance will decrease Outcome: Progressing   Problem: Nutrition: Goal: Adequate nutrition will be maintained Outcome: Progressing   Problem: Coping: Goal: Level of anxiety will decrease Outcome: Progressing   Problem: Elimination: Goal: Will not experience complications related to bowel motility Outcome: Progressing Goal: Will not experience complications related to urinary retention Outcome: Progressing   Problem: Pain Management: Goal: General experience of comfort will improve Outcome: Progressing   Problem: Safety: Goal: Ability to remain free from injury will improve Outcome: Progressing   Problem: Skin Integrity: Goal: Risk for impaired skin integrity will decrease Outcome: Progressing   Problem: Clinical Measurements: Goal: Ability to avoid or minimize complications of infection will improve Outcome: Progressing   Problem: Skin Integrity: Goal: Skin integrity will improve Outcome: Progressing

## 2023-03-09 NOTE — Progress Notes (Addendum)
PROGRESS NOTE  Molly [name]    DOB: [birth year], 77 y.o.  YNW:[id number]    Code Status: Full Code   DOA: 03/07/2023   LOS: 2   Brief hospital course  Molly [name] is a 77 y.o. female with medical history significant for diabetes mellitus type 2, chronic anemia, cellulitis, COPD, hypertension, history of strokes, history of GI bleeds, history of C. difficile colitis, sacral decub. Presented to the ED from home due to concern for her decubitus ulcer.  Further history difficult at this time as she is disoriented.   In the ED, it was found that they had stable vitals with mild tachycardia in low 100s.  Significant findings included LA 1.7, unremarkable BMP, and CBC.urinalysis consistent with dehydration.  They were initially treated with broad spectrum Abx, IV fluids.   Patient was admitted to medicine service for further workup and management of sacral wound, FTT as outlined in detail below.  03/09/23 -hemodynamically stable. AMS improving PT/OT evaluated and recommending SNF. TOC is consulted.  Assessment & Plan  Principal Problem:   Sacral wound Active Problems:   Type 2 diabetes mellitus with hyperlipidemia (HCC)   COPD (chronic obstructive pulmonary disease) (HCC)   Essential hypertension   Wound infection   Sacral decubitus ulcer  Acute metabolic encephalopathy  delirium- likely related to wound, infection and overall poor health condition. Also significant hyperthyroidism POA - treating underlying causes - head CT to r/o acute stroke. Has remote h/o CVA  Sacral decubitus wound POA- stage 3. 2cmx2cm. MRI on previous admission was negative for osteomyelitis and wound cultures were negative at that time. Of note, has smaller ulcers/wound on buttocks, R foot, and forearms -  WoC consulted - analgesia PRN - continue Abx for possible skin source. Stopped vancomycin, no frank pus collection on exam. Transitioned to CTX.  - f/uwound cultures - f/u BxCx  NGTD  Hyperthyroidism. TSH is undetectable on admission. Certainly can be contributing cause of her extreme weight loss  - T3 and T4, and thyroid US pending  Type II DM-  - monitor blood sugars.   Failure to thrive- several skin break downs, several ED visits and hospitalizations in the past year. Appears disheveled, significant weight loss in comparison to her photo ID. Urinalysis on presentation consistent with dehydration - TOC consult - PT/OT - SLP - dietician  - TSH, HIV, RPR, anemia panel- revealing of hyperthyroidism. Further workup pending - consider appetite stimulant  - routine cancer screenings outpatient  - palliative care consult  COPD- not in acute flare  Body mass index is 29.05 kg/m.  VTE ppx: heparin injection 5,000 Units Start: 03/07/23 2300   Diet:     Diet   Diet heart healthy/carb modified Room service appropriate? Yes; Fluid consistency: Thin   Consultants: Palliative    Subjective 03/09/23    Pt reports no complaints. She is more calm today and able to answer orientation questions to herself but thinks she is at "[place]". Does not open eyes to conversation.    Objective   Vitals:   03/08/23 2100 03/08/23 2149 03/09/23 0049 03/09/23 0130  BP: 130/80 130/80  97/60  Pulse: 79 79 84   Resp:  16  16  Temp:  97.7 F (36.5 C) 98 F (36.7 C)   TempSrc:  Axillary Tympanic   SpO2: 100% 100% 100%   Weight:      Height:        Intake/Output Summary (Last 24 hours) at 03/09/2023 6578 Last data filed at  03/09/2023 0500 Gross per 24 hour  Intake 801.71 ml  Output --  Net 801.71 ml   Filed Weights   03/07/23 1717  Weight: 81.6 kg    Physical Exam:  General: awake, alert, NAD HEENT: atraumatic, clear conjunctiva, anicteric sclera, MMM, hearing grossly normal Respiratory: normal respiratory effort. Cardiovascular: quick capillary refill, normal S1/S2, RRR, no JVD, murmurs Gastrointestinal: soft, NT, ND Nervous: A&[name]. Sedated.   Extremities: moves all equally, no edema, normal tone Skin: dry, several superficial lesions.  Psychiatry: flat mood  Labs   I have personally reviewed the following labs and imaging studies CBC    Component Value Date/Time   WBC 5.5 03/08/2023 0959   RBC 4.57 03/08/2023 0959   HGB 12.0 03/08/2023 0959   HCT 37.0 03/08/2023 0959   PLT 246 03/08/2023 0959   MCV 81.0 03/08/2023 0959   MCH 26.3 03/08/2023 0959   MCHC 32.4 03/08/2023 0959   RDW 14.3 03/08/2023 0959   LYMPHSABS 1.4 03/08/2023 0959   MONOABS 0.5 03/08/2023 0959   EOSABS 0.0 03/08/2023 0959   BASOSABS 0.0 03/08/2023 0959      Latest Ref Rng & Units 03/08/2023    9:59 AM 03/07/2023    5:29 PM 04/06/2021    6:52 PM  BMP  Glucose 70 - 99 mg/dL 161  096  045   BUN 8 - 23 mg/dL 20  24  22    Creatinine 0.44 - 1.00 mg/dL 4.09  8.11  9.14   Sodium 135 - 145 mmol/L 136  136  134   Potassium 3.5 - 5.1 mmol/L 4.1  3.4  3.1   Chloride 98 - 111 mmol/L 104  96  99   CO2 22 - 32 mmol/L 20  25  28    Calcium 8.9 - 10.3 mg/dL 8.6  9.1  8.8     CT HEAD WO CONTRAST ( )  Result Date: 03/08/2023 CLINICAL DATA:  Mental status change, persistent or worsening. EXAM: CT HEAD WITHOUT CONTRAST TECHNIQUE: Contiguous axial images were obtained from the base of the skull through the vertex without intravenous contrast. RADIATION DOSE REDUCTION: This exam was performed according to the departmental dose-optimization program which includes automated exposure control, adjustment of the mA and/or kV according to patient size and/or use of iterative reconstruction technique. COMPARISON:  Head CT 12/30/2019 FINDINGS: The study is mildly motion degraded despite repeat imaging. Brain: There is no evidence of an acute large territory infarct intracranial hemorrhage, mass, midline shift, or extra-axial fluid collection. Chronic infarcts are again seen in the anterior left MCA territory, posterior limb of the right internal capsule and mesial right temporal  lobe with wallerian degeneration in the brainstem, and left cerebellar hemisphere. There is a background of extensive chronic small vessel ischemia in the cerebral white matter. There is mild cerebral atrophy. Vascular: Calcified atherosclerosis at the skull base. No hyperdense vessel. Skull: No acute fracture or suspicious osseous lesion. Sinuses/Orbits: Paranasal sinuses and mastoid air cells are clear. Bilateral cataract extraction. Other: None. IMPRESSION: 1. No evidence of acute intracranial abnormality. 2. Extensive chronic ischemia with multiple old infarcts. Electronically Signed   By: Sebastian Ache M.D.   On: 03/08/2023 15:39   DG Chest Port 1 View  Result Date: 03/07/2023 CLINICAL DATA:  Left hip bedsore.  History of a left-sided stroke. EXAM: PORTABLE CHEST 1 VIEW COMPARISON:  07/22/2020. FINDINGS: Cardiac silhouette normal in size.  No mediastinal or hilar masses. Clear lungs.  No pleural effusion or pneumothorax. Skeletal structures are grossly intact.  IMPRESSION: No active disease. Electronically Signed   By: Amie Portland M.D.   On: 03/07/2023 17:51    Disposition Plan & Communication  Patient status: Inpatient  Admitted From: Home Planned disposition location: likely SNF Anticipated discharge date: TBD pending clinical stabilization   Family Communication: none at bedside    Author: Leeroy Bock, DO [facility] 03/09/2023, 7:33 AM   Available by Epic secure chat 7AM-7PM. If 7PM-7AM, please contact night-coverage.  [facility] contact information found on [email or web address].

## 2023-03-09 NOTE — Consult Note (Signed)
Consultation Note Date: 03/09/2023   Patient Name: Molly [name]  DOB: Jan 20, 1946  MRN: [id number]  Age / Sex: 77 y.o., female  PCP: [facility] Referring Physician: Leeroy Bock, MD  Reason for Consultation:  GOC  HPI/Patient Profile: 77 y.o. female  with past medical history of dementia, chronic sacral wound, bedbound for several years s/p stroke, L side hemiplegia, chronic anemia, HTN, admitted on 03/07/2023 from home with complaint of worsening bedsore, likely infected.  Lactic acid normal.  Admitted and being treated for sacral wound. Palliative medicine consulted for GOC.   Primary Decision Maker NEXT OF KIN - spouse  Discussion: Chart reviewed including labs, progress notes, imaging from this and previous encounters.  Evaluated patient. She was awake and alert. Pleasant. Speech mostly tangential, sometimes unintelligible. Replied with confabulation to most of my questions. Unable to participate in GOC discussion.  Per chart review she has been bedbound for several years as a result of her stroke. She has been treated in the past at wound clinic for sacral wounds- most recently seen in March 2023 there. Patient's last visit to hospital was and ED visit in January 2023. No ED visits or admissions in almost 2 years.  Her current weight documented today is 176 lbs. This is increased from documented weight in April 2023 of 163 lbs. Albumin mildly low at 3.4 but not significant.  No family at bedside.  No advanced directive documents on chart.  Called spouse but voicemail was full. I called her brother and left a message requesting a return call.    SUMMARY OF RECOMMENDATIONS -Chronic debility related to history of stroke- bedbound, history of chronic sacral wounds- recommend continued treatment and wound care consult- she has been seen in the past at wound clinic for  same -Failure to thrive- while she is debilitated- it is unclear that this is a significant change in her baseline- she has gained approx 13 pounds in the last two years- likely needs improved nutrition to encourage wound healing- albumin just barely low at 3.4- likely to benefit from protein supplementation- dietician consulted by attending- meals documented at 70% intake -Dementia- appears significant with altered mental status, disorientation- unable to assess her baseline- no family present- continue current interventions -PMT will continue to follow and make attempts to reach family -GOC/Advanced care planning- no documents on chart- no documentation of prior discussions- PMT will continue to try and reach family - also recommend code status discussion by attending team if they are able to reach family  Code Status/Advance Care Planning: Full code   Prognosis:   Unable to determine  Discharge Planning: To Be Determined  Primary Diagnoses: Present on Admission:  Type 2 diabetes mellitus with hyperlipidemia (HCC)  COPD (chronic obstructive pulmonary disease) (HCC)  Essential hypertension  Sacral wound  Wound infection  Sacral decubitus ulcer   Review of Systems  Unable to perform ROS: Dementia    Physical Exam Vitals and nursing note reviewed.  Constitutional:      General: She is  not in acute distress.    Appearance: She is not ill-appearing.  Cardiovascular:     Rate and Rhythm: Normal rate.  Pulmonary:     Effort: Pulmonary effort is normal.  Skin:    Comments: Scabbed wounds on arms  Neurological:     Mental Status: She is alert. She is disoriented.  Psychiatric:     Comments: pleasant     Vital Signs: BP (!) 143/75 (BP Location: Right Arm)   Pulse 77   Temp 97.9 F (36.6 C) (Axillary)   Resp 17   Ht 5\' 6"  (1.676 m)   Wt 79.9 kg   SpO2 99%   BMI 28.43 kg/m  Pain Scale: PAINAD   Pain Score: 0-No pain   SpO2: SpO2: 99 % O2 Device:SpO2: 99 % O2 Flow  Rate: .   IO: Intake/output summary:  Intake/Output Summary (Last 24 hours) at 03/09/2023 1537 Last data filed at 03/09/2023 1421 Gross per 24 hour  Intake 741.71 ml  Output --  Net 741.71 ml    LBM: Last BM Date : 03/09/23 Baseline Weight: Weight: 81.6 kg Most recent weight: Weight: 79.9 kg       Thank you for this consult. Palliative medicine will continue to follow and assist as needed.  Time Total: 80 minutes Signed by: Ocie Bob, AGNP-C Palliative Medicine  Time includes:   Preparing to see the patient (e.g., review of tests) Obtaining and/or reviewing separately obtained history Performing a medically necessary appropriate examination and/or evaluation Counseling and educating the patient/family/caregiver Ordering medications, tests, or procedures Referring and communicating with other health care professionals (when not reported separately) Documenting clinical information in the electronic or other health record Independently interpreting results (not reported separately) and communicating results to the patient/family/caregiver Care coordination (not reported separately) Clinical documentation   Please contact Palliative Medicine Team phone at [phone] for questions and concerns.  For individual provider: See Loretha Stapler

## 2023-03-09 NOTE — Progress Notes (Signed)
Order for purewick through out the night pr [email or web address]. remove in am

## 2023-03-10 DIAGNOSIS — I69354 Hemiplegia and hemiparesis following cerebral infarction affecting left non-dominant side: Secondary | ICD-10-CM | POA: Diagnosis not present

## 2023-03-10 DIAGNOSIS — I10 Essential (primary) hypertension: Secondary | ICD-10-CM | POA: Diagnosis not present

## 2023-03-10 DIAGNOSIS — F03B0 Unspecified dementia, moderate, without behavioral disturbance, psychotic disturbance, mood disturbance, and anxiety: Secondary | ICD-10-CM | POA: Diagnosis not present

## 2023-03-10 DIAGNOSIS — Z515 Encounter for palliative care: Secondary | ICD-10-CM | POA: Diagnosis not present

## 2023-03-10 DIAGNOSIS — S31000A Unspecified open wound of lower back and pelvis without penetration into retroperitoneum, initial encounter: Secondary | ICD-10-CM | POA: Diagnosis not present

## 2023-03-10 LAB — T3: T3, Total: 103 ng/dL (ref 71–180)

## 2023-03-10 MED ORDER — AMOXICILLIN-POT CLAVULANATE 875-125 MG PO TABS
1.0000 | ORAL_TABLET | Freq: Two times a day (BID) | ORAL | Status: DC
  Administered 2023-03-10 – 2023-03-11 (×3): 1 via ORAL
  Filled 2023-03-10 (×3): qty 1

## 2023-03-10 MED ORDER — METHIMAZOLE 5 MG PO TABS
5.0000 mg | ORAL_TABLET | Freq: Every day | ORAL | Status: DC
  Administered 2023-03-10 – 2023-03-11 (×2): 5 mg via ORAL
  Filled 2023-03-10 (×2): qty 1

## 2023-03-10 NOTE — Progress Notes (Signed)
Daily Progress Note   Patient Name: Molly [name]       Date: 03/10/2023 DOB: 12-16-1945  Age: 77 y.o. MRN#: [id number] Attending Physician: Marrion Coy, MD Primary Care Physician: [facility] Admit Date: 03/07/2023  Reason for Consultation/Follow-up: Establishing goals of care  Patient Profile/HPI:   77 y.o. female  with past medical history of dementia, chronic sacral wound, bedbound for several years s/p stroke, L side hemiplegia, chronic anemia, HTN, admitted on 03/07/2023 from home with complaint of worsening bedsore, likely infected.  Lactic acid normal.  Admitted and being treated for sacral wound. Palliative medicine consulted for GOC.   Subjective: Chart reviewed including labs, progress notes, imaging from this and previous encounters.  Patient is stable.  She is awake, alert. Echoes everything I say. Answers all questions by echo.  No family at bedside.   Review of Systems  Unable to perform ROS: Dementia     Physical Exam Vitals and nursing note reviewed.  Constitutional:      General: She is not in acute distress.    Appearance: She is not ill-appearing.  Musculoskeletal:     Comments: L hemiplegia   Skin:    General: Skin is warm and dry.     Comments: Scattered sores on upper extremity in various stages of healing  Neurological:     Mental Status: She is alert. She is disoriented.     Comments: echolalia             Vital Signs: BP 105/70   Pulse 69   Temp 98.2 F (36.8 C)   Resp 15   Ht 5\' 6"  (1.676 m)   Wt 60.4 kg   SpO2 100%   BMI 21.49 kg/m  SpO2: SpO2: 100 % O2 Device: O2 Device: Room Air O2 Flow Rate:    Intake/output summary: No intake or output data in the 24 hours ending 03/10/23 1455 LBM: Last BM Date :  03/09/23 Baseline Weight: Weight: 81.6 kg Most recent weight: Weight: 60.4 kg       Palliative Assessment/Data: PPS: 30%      Patient Active Problem List   Diagnosis Date Noted   Hemiparesis affecting left side as late effect of cerebrovascular accident (CVA) (HCC) 03/10/2023   Malnutrition of moderate degree 03/09/2023   Sacral decubitus ulcer 03/07/2023  Sacral wound 03/26/2021   Wound infection 03/26/2021   UTI (urinary tract infection) 03/26/2021   Microcytic anemia 03/26/2021   Constipation 07/24/2020   Megacolon 07/22/2020   Obesity, diabetes, and hypertension syndrome (HCC)    Vaginal bleeding    Acute urinary retention    Acute metabolic encephalopathy    Rectal bleeding    Weakness    Lethargy    History of completed stroke    GI bleeding 12/27/2019   Lower GI bleed 12/27/2019   C. 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   Cellulitis of left lower extremity 11/19/2014   COPD (chronic obstructive pulmonary disease) (HCC) 11/19/2014   Essential hypertension 11/19/2014   HLD (hyperlipidemia) 11/19/2014   Sepsis (HCC) 11/19/2014   Type 2 diabetes mellitus with hyperlipidemia (HCC) 05/21/2014   Chronic anemia 05/21/2014    Palliative Care Assessment & Plan    Assessment/Recommendations/Plan  Chronic debility- continue current plan- TOC working on disposition and has discussed with patient's brother GOC- have attempted to discuss with spouse- have not yet received return phone call- PMT will continue efforts to reach designated surrogate   Code Status: Full code  Prognosis:   Unable to determine  Discharge Planning: To Be Determined    Thank you for allowing the Palliative Medicine Team to assist in the care of this patient.  Total time:  25 minutes Prolonged billing:  Time includes:   Preparing to see the patient (e.g., review of tests) Obtaining and/or reviewing separately obtained history Performing a medically necessary appropriate examination and/or evaluation Counseling and educating the patient/family/caregiver Ordering medications, tests, or procedures Referring and communicating with other health care professionals (when not reported separately) Documenting clinical information in the electronic or other health record Independently interpreting results (not reported separately) and communicating results to the patient/family/caregiver Care coordination (not reported separately) Clinical documentation  Ocie Bob, AGNP-C Palliative Medicine   Please contact Palliative Medicine Team phone at [phone] for questions and concerns.

## 2023-03-10 NOTE — TOC Progression Note (Signed)
Transition of Care [facility]) - Progression Note    Patient Details  Name: Molly [name] MRN: [id number] Date of Birth: [birth year]  Transition of Care [facility]) CM/SW Contact  Marlowe Sax, RN Phone Number: 03/10/2023, 12:39 PM  Clinical Narrative:     Called Husband and was not able to reach him, unable to leave a VM as VM is full, I called Dennard Nip the Brother  He reports that they have no children, she has had a stroke over 5 years ago, Husband is older with health issues and has been caring for the patient The Brother spoke to the Husband and asked for him to call Palliative, he stated that he is not surprised that she has wounds, he reports that she is bed bound  She does ot have Medicaid in Place, I explained it can take up to 90 days to get Medicaid approved I explained that she does not have a payor for Long term medicaid, I provided information thru the PACE program He will get the husband Ronne Binning and take him to [place] to talk to them about what they can do,  He asked that we do a bed search for STR to see if her Ins will cover until other plans are in place  Expected Discharge Plan:  (TBD) Barriers to Discharge: Continued Medical Work up  Expected Discharge Plan and Services   Discharge Planning Services: CM Consult   Living arrangements for the past 2 months: Single Family Home                                       Social Determinants of Health (SDOH) Interventions SDOH Screenings   Food Insecurity: Patient Unable To Answer (03/09/2023)  Housing: Patient Unable To Answer (03/09/2023)  Transportation Needs: Patient Unable To Answer (03/09/2023)  Utilities: Patient Unable To Answer (03/09/2023)  Tobacco Use: Medium Risk (03/09/2023)    Readmission Risk Interventions    03/08/2023    4:53 PM  Readmission Risk Prevention Plan  Post Dischage Appt Complete  Medication Screening Complete

## 2023-03-10 NOTE — Progress Notes (Signed)
  Progress Note   Patient: Molly [name] ZOX:[id number] DOB: [birth year] DOA: 03/07/2023     3 DOS: the patient was seen and examined on 03/10/2023   Brief hospital course: Molly [name] is a 77 y.o. female with medical history significant for diabetes mellitus type 2, chronic anemia, cellulitis, COPD, hypertension, history of strokes, history of GI bleeds, history of C. difficile colitis, sacral decub. Presented to the ED from home due to concern for her decubitus ulcer.  Initially treated with broad-spectrum antibiotics, seen by wound care, no evidence of osteomyelitis, wound culture negative.  Antibiotic switched to oral Augmentin. Currently, patient is stable for discharge, pending nursing home placement.   Principal Problem:   Sacral wound Active Problems:   Type 2 diabetes mellitus with hyperlipidemia (HCC)   COPD (chronic obstructive pulmonary disease) (HCC)   Essential hypertension   Wound infection   Sacral decubitus ulcer   Malnutrition of moderate degree   Assessment and Plan: Acute metabolic encephalopathy  delirium History of stroke with left hemiplegia. Likely vascular dementia. Failure to thrive Discussed with patient brother, patient husband also has medical problems.  She has difficulty getting up, she also had a history of stroke with left sided weakness.  At this point, it is no longer safe for patient to stay at home.  Currently TOC is looking for nursing home placement.   Sacral decubitus wound POA- stage 3. 2cmx2cm. MRI on previous admission was negative for osteomyelitis and wound cultures were negative at that time. Of note, has smaller ulcers/wound on buttocks, R foot, and forearms Patient initially treated with vancomycin, discontinued, do not see any deep infection.  Will continue Augmentin for 5 days.  Hyperthyroidism.  Thyroid nodule. Patient has an detectable TSH and increased T4.  Reviewed patient medication list, patient was not been taking  Synthroid.  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  Will start methimazole at a lower dose. Thyroid ultrasound showed 2 thyroid nodules, recommended fine-needle biopsy for 1.   Type II DM-  - monitor blood sugars.     COPD- not in acute flare      Subjective:  Patient is very confused.  Still has good appetite.  Physical Exam: Vitals:   03/09/23 1600 03/09/23 2309 03/10/23 0500 03/10/23 0804  BP: (!) 157/66 108/63  105/70  Pulse: 71 81  69  Resp: 14 18  15   Temp: (!) 97.5 F (36.4 C) 98.2 F (36.8 C)    TempSrc:      SpO2: 98% 99%  100%  Weight:   60.4 kg   Height:       General exam: Appears calm and comfortable  Respiratory system: Clear to auscultation. Respiratory effort normal. Cardiovascular system: S1 & S2 heard, RRR. No JVD, murmurs, rubs, gallops or clicks. No pedal edema. Gastrointestinal system: Abdomen is nondistended, soft and nontender. No organomegaly or masses felt. Normal bowel sounds heard. Central nervous system: Alert and oriented x1, left hemiparesis.. Extremities: Symmetric 5 x 5 power. Skin: No rashes, lesions or ulcers Psychiatry: Flat affect.   Data Reviewed:    Family Communication: Brother updated over the phone.  Disposition: Status is: Inpatient Remains inpatient appropriate because: Severity of disease, unsafe discharge option.     Time spent: 50 minutes  Author: Marrion Coy, MD 03/10/2023 12:12 PM  For on call review [email or web address].

## 2023-03-10 NOTE — [place] FL2 (Signed)
MEDICAID FL2 LEVEL OF CARE FORM     IDENTIFICATION  Patient Name: Molly [name] Birthdate: [birth year] Sex: female Admission Date (Current Location): 03/07/2023  [facility] and [place] Number:  Chiropodist and Address:  [facility], [address], [place], [place] [address]      Provider Number: [id number]  Attending Physician Name and Address:  Marrion Coy, MD  Relative Name and Phone Number:  Fusako Espejo  [facility]  Emergency Contact  [phone]    Current Level of Care: Hospital Recommended Level of Care: Skilled Nursing Facility Prior Approval Number:    Date Approved/Denied:   PASRR Number: [id number]  Discharge Plan: SNF    Current Diagnoses: Patient Active Problem List   Diagnosis Date Noted   Hemiparesis affecting left side as late effect of cerebrovascular accident (CVA) (HCC) 03/10/2023   Malnutrition of moderate degree 03/09/2023   Sacral decubitus ulcer 03/07/2023   Sacral wound 03/26/2021   Wound infection 03/26/2021   UTI (urinary tract infection) 03/26/2021   Microcytic anemia 03/26/2021   Constipation 07/24/2020   Megacolon 07/22/2020   Obesity, diabetes, and hypertension syndrome (HCC)    Vaginal bleeding    Acute urinary retention    Acute metabolic encephalopathy    Rectal bleeding    Weakness    Lethargy    History of completed stroke    GI bleeding 12/27/2019   Lower GI bleed 12/27/2019   C. 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   Cellulitis of left lower extremity 11/19/2014   COPD (chronic obstructive pulmonary  disease) (HCC) 11/19/2014   Essential hypertension 11/19/2014   HLD (hyperlipidemia) 11/19/2014   Sepsis (HCC) 11/19/2014   Type 2 diabetes mellitus with hyperlipidemia (HCC) 05/21/2014   Chronic anemia 05/21/2014    Orientation RESPIRATION BLADDER Height & Weight     Self, Place  Normal Incontinent Weight: 60.4 kg Height:  5\' 6"  (167.6 cm)  BEHAVIORAL SYMPTOMS/MOOD NEUROLOGICAL BOWEL NUTRITION STATUS      Incontinent Diet  AMBULATORY STATUS COMMUNICATION OF NEEDS Skin   Extensive Assist Verbally Surgical wounds, PU Stage and Appropriate Care (Wound type: 1. Stage 3 Pressure Injury Sacrum  2. Stage 2 PI R medial buttock   3. Moisture Associated Skin Damage buttocks  4.  Full thickness R dorsal foot ? Etiology   Pressure Injury POA: Yes)                       Personal Care Assistance Level of Assistance  Bathing, Feeding, Dressing Bathing Assistance: Maximum assistance Feeding assistance: Maximum assistance Dressing Assistance: Maximum assistance     Functional Limitations Info  Sight, Hearing, Speech Sight Info: Adequate Hearing Info: Adequate Speech Info: Adequate    SPECIAL CARE FACTORS FREQUENCY  PT (By licensed PT), OT (By licensed OT)     PT Frequency: 5 times per week OT Frequency: 5 times per week            Contractures Contractures Info: Not present    Additional Factors Info  Code Status, Allergies Code  Status Info: full code Allergies Info: NKDA           Current Medications (03/10/2023):  This is the current hospital active medication list Current Facility-Administered Medications  Medication Dose Route Frequency Provider Last Rate Last Admin   acetaminophen (TYLENOL) tablet 650 mg  650 mg Oral Q6H PRN Gertha Calkin, MD       Or   acetaminophen (TYLENOL) suppository 650 mg  650 mg Rectal Q6H PRN Gertha Calkin, MD       albuterol (PROVENTIL) (2.5 MG/3ML) 0.083% nebulizer solution 2.5 mg  2.5 mg Inhalation Q6H PRN Gertha Calkin, MD        amoxicillin-clavulanate (AUGMENTIN) 875-125 MG per tablet 1 tablet  1 tablet Oral Q12H Marrion Coy, MD   1 tablet at 03/10/23 1335   ascorbic acid (VITAMIN C) tablet 500 mg  500 mg Oral BID Leeroy Bock, MD   500 mg at 03/10/23 5784   aspirin EC tablet 81 mg  81 mg Oral Daily Gertha Calkin, MD   81 mg at 03/10/23 0902   feeding supplement (ENSURE ENLIVE / ENSURE PLUS) liquid 237 mL  237 mL Oral TID BM Leeroy Bock, MD   237 mL at 03/10/23 1335   Gerhardt's butt cream   Topical BID Leeroy Bock, MD   Given at 03/10/23 6962   heparin injection 5,000 Units  5,000 Units Subcutaneous Q12H Gertha Calkin, MD   5,000 Units at 03/10/23 0902   leptospermum manuka honey (MEDIHONEY) paste 1 Application  1 Application Topical Daily Leeroy Bock, MD   1 Application at 03/10/23 0902   LORazepam (ATIVAN) injection 1 mg  1 mg Intravenous Once PRN Leeroy Bock, MD       methimazole (TAPAZOLE) tablet 5 mg  5 mg Oral Daily Marrion Coy, MD       multivitamin with minerals tablet 1 tablet  1 tablet Oral Daily Leeroy Bock, MD   1 tablet at 03/10/23 9528   nicotine (NICODERM CQ - dosed in mg/24 hours) patch 14 mg  14 mg Transdermal Daily PRN Gertha Calkin, MD       nystatin (MYCOSTATIN/NYSTOP) topical powder   Topical TID Gertha Calkin, MD   Given at 03/10/23 0903   ondansetron (ZOFRAN) tablet 4 mg  4 mg Oral Q6H PRN Gertha Calkin, MD       Or   ondansetron [facility]) injection 4 mg  4 mg Intravenous Q6H PRN Gertha Calkin, MD       zinc sulfate (50mg  elemental zinc) capsule 220 mg  220 mg Oral Daily Leeroy Bock, MD   220 mg at 03/10/23 0902     Discharge Medications: Please see discharge summary for a list of discharge medications.  Relevant Imaging Results:  Relevant Lab Results:   Additional Information Wound type: 1. Stage 3 Pressure Injury Sacrum  2. Stage 2 PI R medial buttock   3. Moisture Associated Skin Damage buttocks  4.  Full thickness R dorsal foot ?  Etiology   Pressure Injury POA: Yes  Measurement: 1.  Sacral PI 2 cm x 2 cm x 0.1 cm 80% pink moist 20% tan fibrinous tissue   2.  R medial buttock Stage 2 1 cm x 1 cm x 0.1 cm 100% pink moist   3.  Widespread erythema and scattered partial thickness skin loss to buttocks/coccyx d/t moisture and friction   4.  R dorsal foot full thickness  1 cm x 1 cm 100% yellow dried exudate   ICD-10 CM Codes for Irritant Dermatitis  Khaleb Broz Seward Carol, RN

## 2023-03-10 NOTE — Care Management Important Message (Signed)
Important Message  Patient Details  Name: Molly [name] MRN: [id number] Date of Birth: 24-Mar-1946   Important Message Given:  N/A - LOS <3 / Initial given by admissions     [name] 03/10/2023, 10:24 AM

## 2023-03-10 NOTE — Hospital Course (Signed)
Molly [name] is a 77 y.o. female with medical history significant for diabetes mellitus type 2, chronic anemia, cellulitis, COPD, hypertension, history of strokes, history of GI bleeds, history of C. difficile colitis, sacral decub. Presented to the ED from home due to concern for her decubitus ulcer.  Initially treated with broad-spectrum antibiotics, seen by wound care, no evidence of osteomyelitis, wound culture negative.  Antibiotic switched to oral Augmentin. Currently, patient is stable for discharge, pending nursing home placement. However, patient husband came in today, wishes to take patient home.  Will set up home care.

## 2023-03-10 NOTE — Plan of Care (Signed)
Patient ID: Molly [name], female   DOB: Apr 26, 1945, 77 y.o.   MRN: [id number]  Problem: Education: Goal: Knowledge of General Education information will improve Description: Including pain rating scale, medication(s)/side effects and non-pharmacologic comfort measures Outcome: Progressing   Problem: Health Behavior/Discharge Planning: Goal: Ability to manage health-related needs will improve Outcome: Progressing   Problem: Clinical Measurements: Goal: Ability to maintain clinical measurements within normal limits will improve Outcome: Progressing Goal: Will remain free from infection Outcome: Progressing Goal: Diagnostic test results will improve Outcome: Progressing Goal: Respiratory complications will improve Outcome: Progressing Goal: Cardiovascular complication will be avoided Outcome: Progressing   Problem: Activity: Goal: Risk for activity intolerance will decrease Outcome: Progressing   Problem: Nutrition: Goal: Adequate nutrition will be maintained Outcome: Progressing   Problem: Coping: Goal: Level of anxiety will decrease Outcome: Progressing   Problem: Elimination: Goal: Will not experience complications related to bowel motility Outcome: Progressing Goal: Will not experience complications related to urinary retention Outcome: Progressing   Problem: Pain Management: Goal: General experience of comfort will improve Outcome: Progressing   Problem: Safety: Goal: Ability to remain free from injury will improve Outcome: Progressing   Problem: Skin Integrity: Goal: Risk for impaired skin integrity will decrease Outcome: Progressing   Problem: Clinical Measurements: Goal: Ability to avoid or minimize complications of infection will improve Outcome: Progressing   Problem: Skin Integrity: Goal: Skin integrity will improve Outcome: Progressing    Lidia Collum, RN

## 2023-03-11 DIAGNOSIS — I10 Essential (primary) hypertension: Secondary | ICD-10-CM | POA: Diagnosis not present

## 2023-03-11 DIAGNOSIS — I69354 Hemiplegia and hemiparesis following cerebral infarction affecting left non-dominant side: Secondary | ICD-10-CM | POA: Diagnosis not present

## 2023-03-11 DIAGNOSIS — L89153 Pressure ulcer of sacral region, stage 3: Principal | ICD-10-CM

## 2023-03-11 DIAGNOSIS — F01C18 Vascular dementia, severe, with other behavioral disturbance: Secondary | ICD-10-CM

## 2023-03-11 MED ORDER — ASCORBIC ACID 500 MG PO TABS: 500.0000 mg | ORAL_TABLET | Freq: Two times a day (BID) | ORAL | 0 refills | Status: DC

## 2023-03-11 MED ORDER — GERHARDT'S BUTT CREAM: 1.0000 | TOPICAL_CREAM | Freq: Two times a day (BID) | CUTANEOUS | 0 refills | Status: DC

## 2023-03-11 MED ORDER — ADULT MULTIVITAMIN W/MINERALS CH: 1.0000 | ORAL_TABLET | Freq: Every day | ORAL | 0 refills | Status: DC

## 2023-03-11 MED ORDER — AMOXICILLIN-POT CLAVULANATE 875-125 MG PO TABS: 1.0000 | ORAL_TABLET | Freq: Two times a day (BID) | ORAL | 0 refills | Status: AC

## 2023-03-11 MED ORDER — AMOXICILLIN-POT CLAVULANATE 875-125 MG PO TABS: 1.0000 | ORAL_TABLET | Freq: Two times a day (BID) | ORAL | 0 refills | Status: DC

## 2023-03-11 MED ORDER — ASCORBIC ACID 500 MG PO TABS: 500.0000 mg | ORAL_TABLET | Freq: Two times a day (BID) | ORAL | 0 refills | Status: AC

## 2023-03-11 MED ORDER — ZINC SULFATE 220 (50 ZN) MG PO CAPS: 220.0000 mg | ORAL_CAPSULE | Freq: Every day | ORAL | 0 refills | Status: DC

## 2023-03-11 MED ORDER — METHIMAZOLE 5 MG PO TABS: 5.0000 mg | ORAL_TABLET | Freq: Every day | ORAL | 0 refills | Status: DC

## 2023-03-11 MED ORDER — MAGNESIUM CHLORIDE 64 MG PO TBEC: 1.0000 | DELAYED_RELEASE_TABLET | Freq: Two times a day (BID) | ORAL | 0 refills | Status: DC

## 2023-03-11 NOTE — Plan of Care (Signed)

## 2023-03-11 NOTE — Progress Notes (Signed)
EMS raised concern about patient's living conditions stating the house is filthy like a hoarder house and he states husband will not be able to take care of her. EMS states he notified ED when he brought the patient to the hospital.   RN notified Manuela Schwartz, NP of the EMS concerns about discharge. RN was told she is unable to stop the discharge. Per previous TOC notes Home health has been set up for this patient. RN was told to discharge the patient.   Patient left with EMS via stretcher with all of her belongings. Patient safety maintained during transport.

## 2023-03-11 NOTE — Discharge Summary (Signed)
Physician Discharge Summary   Patient: Molly [name] MRN: [id number] DOB: [birth year]  Admit date:     03/07/2023  Discharge date: 03/11/23  Discharge Physician: Marrion Coy   PCP: [facility]   Recommendations at discharge:   Follow-up with PCP in 1 week. Referred to fine-needle biopsy of the thyroid nodule if deemed necessary.  Discharge Diagnoses: Principal Problem:   Sacral wound Active Problems:   Type 2 diabetes mellitus with hyperlipidemia (HCC)   COPD (chronic obstructive pulmonary disease) (HCC)   Essential hypertension   Wound infection   Sacral decubitus ulcer   Malnutrition of moderate degree   Hemiparesis affecting left side as late effect of cerebrovascular accident (CVA) (HCC) Minimal metabolic acidosis. Resolved Problems:   * No resolved hospital problems. * Hypokalemia. Hospital Course: Molly [name] is a 77 y.o. female with medical history significant for diabetes mellitus type 2, chronic anemia, cellulitis, COPD, hypertension, history of strokes, history of GI bleeds, history of C. difficile colitis, sacral decub. Presented to the ED from home due to concern for her decubitus ulcer.  Initially treated with broad-spectrum antibiotics, seen by wound care, no evidence of osteomyelitis, wound culture negative.  Antibiotic switched to oral Augmentin. Currently, patient is stable for discharge, pending nursing home placement. However, patient husband came in today, wishes to take patient home.  Will set up home care.  Assessment and Plan: Acute metabolic encephalopathy ruled out. History of stroke with left hemiplegia. Vascular dementia with behavioral abnormality. Failure to thrive Discussed with patient brother, patient husband also has medical problems.  She has difficulty getting up, she also had a history of stroke with left sided weakness.  Initially were looking for nursing home placement.  However, husband came in today, wishes to  bring patient home.  He states that he has been taking care of patient for the last few years.  At this point, patient be discharged home with home care.   Sacral decubitus wound POA- stage 3. 2cmx2cm. MRI on previous admission was negative for osteomyelitis and wound cultures were negative at that time. Patient initially treated with vancomycin, discontinued, do not see any deep infection.  Will continue Augmentin for 5 days. Set up home care with RN to manage wounds  Pressure Injury 03/08/23 Sacrum Lower;Mid Stage 3 -  Full thickness tissue loss. Subcutaneous fat may be visible but bone, tendon or muscle are NOT exposed. 2 cm x 2 cm x 0.1 cm 80% pink moist 20% tan (Active)  03/08/23   Location: Sacrum  Location Orientation: Lower;Mid  Staging: Stage 3 -  Full thickness tissue loss. Subcutaneous fat may be visible but bone, tendon or muscle are NOT exposed.  Wound Description (Comments): 2 cm x 2 cm x 0.1 cm 80% pink moist 20% tan  Present on Admission: Yes     Pressure Injury 03/08/23 Buttocks Medial;Right Stage 2 -  Partial thickness loss of dermis presenting as a shallow open injury with a red, pink wound bed without slough. 1 cm x 1 cm x 0.1 cm 100% pink moist (Active)  03/08/23   Location: Buttocks  Location Orientation: Medial;Right  Staging: Stage 2 -  Partial thickness loss of dermis presenting as a shallow open injury with a red, pink wound bed without slough.  Wound Description (Comments): 1 cm x 1 cm x 0.1 cm 100% pink moist  Present on Admission: Yes        Hyperthyroidism.  Thyroid nodule. Patient has an detectable TSH and increased  T4.  Reviewed patient medication list, patient was not been taking Synthroid.  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  Will start methimazole at a lower dose. Thyroid ultrasound showed 2 thyroid nodules, recommended fine-needle biopsy for 1.  This can be done as outpatient, currently patient has advanced dementia, poor prognosis, biopsy may not  provide any value for the patient.   Type II DM-  - monitor blood sugars.      COPD- not in acute flare      Consultants: None Procedures performed: None  Disposition: Home health Diet recommendation:  Discharge Diet Orders (From admission, onward)     Start     Ordered   03/11/23 0000  Diet general       Comments: Dys 3 diet   03/11/23 1514           Dysphagia type 3 thin Liquid DISCHARGE MEDICATION: Allergies as of 03/11/2023   No Known Allergies      Medication List     STOP taking these medications    albuterol 108 (90 Base) MCG/ACT inhaler Commonly known as: VENTOLIN HFA   atorvastatin 20 MG tablet Commonly known as: LIPITOR   collagenase 250 UNIT/GM ointment Commonly known as: SANTYL   Generlac 10 GM/15ML Soln Generic drug: lactulose (encephalopathy)   HumaLOG 100 UNIT/ML injection Generic drug: insulin lispro   metFORMIN 500 MG tablet Commonly known as: GLUCOPHAGE   traZODone 50 MG tablet Commonly known as: DESYREL   Tresiba FlexTouch 100 UNIT/ML FlexTouch Pen Generic drug: insulin degludec       TAKE these medications    acetaminophen 325 MG tablet Commonly known as: TYLENOL Take 650 mg by mouth every 6 (six) hours as needed for moderate pain or mild pain.   amoxicillin-clavulanate 875-125 MG tablet Commonly known as: AUGMENTIN Take 1 tablet by mouth every 12 (twelve) hours for 4 days.   ascorbic acid 500 MG tablet Commonly known as: VITAMIN C Take 1 tablet (500 mg total) by mouth 2 (two) times daily.   aspirin EC 81 MG tablet Take 81 mg by mouth daily.   bisacodyl 10 MG suppository Commonly known as: DULCOLAX Place 10 mg rectally every 3 (three) days. Hold for loose stool   diclofenac sodium 1 % Gel Commonly known as: VOLTAREN Apply 2 g topically 4 (four) times daily. To each shoulders   Gerhardt's butt cream Crea Apply 1 Application topically 2 (two) times daily.   methimazole 5 MG tablet Commonly known as:  TAPAZOLE Take 1 tablet (5 mg total) by mouth daily. Start taking on: March 12, 2023   multivitamin with minerals Tabs tablet Take 1 tablet by mouth daily. Start taking on: March 12, 2023   OXYGEN Inhale 2 L into the lungs daily as needed (to keep O2 stats >90%).   polyethylene glycol 17 g packet Commonly known as: MIRALAX / GLYCOLAX Take 17 g by mouth daily. Dissolved in 4 - 9 ounces of liquid. Hold for loose stools.   senna-docusate 8.6-50 MG tablet Commonly known as: Senokot-S Take 2 tablets by mouth 2 (two) times daily.   simethicone 80 MG chewable tablet Commonly known as: MYLICON Chew 160 mg by mouth daily as needed for flatulence.   Vitamin D3 25 MCG (1000 UT) Caps Take 1,000 Units by mouth daily.   zinc sulfate (50mg  elemental zinc) 220 (50 Zn) MG capsule Take 1 capsule (220 mg total) by mouth daily. Start taking on: March 12, 2023  Discharge Care Instructions  (From admission, onward)           Start     Ordered   03/11/23 0000  Discharge wound care:       Comments: Pressure Injury 03/08/23 Buttocks Medial;Right Stage 2 -  Partial thickness loss of dermis presenting as a shallow open injury with a red, pink wound bed without slough. 1 cm x 1 cm x 0.1 cm 100% pink moist 3 days   Pressure Injury 03/08/23 Sacrum Lower;Mid Stage 3 -  Full thickness tissue loss. Subcutaneous fat may be visible but bone, tendon or muscle are NOT exposed. 2 cm x 2 cm x 0.1 cm 80% pink moist 20% tan   03/11/23 1514            Follow-up Information     [facility], [facility] Follow up in 1 week(s).   Contact information: [address] [place] [address] [phone]                Discharge Exam: Filed Weights   03-24-23 1350 03/10/23 0500 03/11/23 0500  Weight: 79.9 kg 60.4 kg 61.7 kg   General exam: Appears calm and comfortable  Respiratory system: Clear to auscultation. Respiratory effort normal. Cardiovascular system: S1  & S2 heard, RRR. No JVD, murmurs, rubs, gallops or clicks. No pedal edema. Gastrointestinal system: Abdomen is nondistended, soft and nontender. No organomegaly or masses felt. Normal bowel sounds heard. Central nervous system: Alert and confused with left hemiplegia. Extremities: Symmetric 5 x 5 power. Skin: No rashes, lesions or ulcers Psychiatry: Mood & affect appropriate.    Condition at discharge: fair  The results of significant diagnostics from this hospitalization (including imaging, microbiology, ancillary and laboratory) are listed below for reference.   Imaging Studies: US THYROID Result Date: 03/24/23 CLINICAL DATA:  Hyperthyroidism EXAM: THYROID ULTRASOUND TECHNIQUE: Ultrasound examination of the thyroid gland and adjacent soft tissues was performed. COMPARISON:  08/21/2015 FINDINGS: Parenchymal Echotexture: Isthmus: 6 mm thickness, previously 5 mm Right lobe: 5.6 x 2.8 x 3.7 cm, previously 5.4 x 3.6 x 3.8 Left lobe: 3.2 x 1.8 x 2.1 cm, previously 4.3 x 1.7 x 2.1 _________________________________________________________ Estimated total number of nodules >/= 1 cm: 2 Number of spongiform nodules >/=  2 cm not described below (TR1): 0 Number of mixed cystic and solid nodules >/= 1.5 cm not described below (TR2): 0 _________________________________________________________ Nodule # 1: Prior biopsy: No Location: Right; mid Maximum size: 5 cm; Other 2 dimensions: 3.3 x 3.8 cm, previously, 3.9 x 3.3 x 3.5 cm Composition: mixed cystic and solid (1) Echogenicity: hypoechoic (2) Shape: not taller-than-wide (0) Margins: ill-defined (0) Echogenic foci: macrocalcifications (1) ACR TI-RADS total points: 4. ACR TI-RADS risk category:  TR 4. Significant change in size (>/= 20% in two dimensions and minimal increase of 2 mm): No Change in features: No Change in ACR TI-RADS risk category: No ACR TI-RADS recommendations: **Given size (>/= 1.5 cm) and appearance, fine needle aspiration of this moderately  suspicious nodule should be considered based on TI-RADS criteria. _________________________________________________________ Nodule # 2: Prior biopsy: No Location: Left; mid Maximum size: 2.46 cm; Other 2 dimensions: 1.9 x 2 cm, previously, 1 x 0.9 x 1 cm Composition: mixed cystic and solid (1) Echogenicity: hypoechoic (2) Shape: not taller-than-wide (0) Margins: ill-defined (0) Echogenic foci: none (0) ACR TI-RADS total points: 3. ACR TI-RADS risk category:  TR 3. Significant change in size (>/= 20% in two dimensions and minimal increase of 2 mm): Yes Change  in features: No Change in ACR TI-RADS risk category: No ACR TI-RADS recommendations: *Given size (>/= 1.5 - 2.4 cm) and appearance, a follow-up ultrasound in 1 year should be considered based on TI-RADS criteria. _________________________________________________________ No regional cervical adenopathy identified. IMPRESSION: 1. Recommend FNA biopsy of 5 cm right mid TR 4 nodule. 2. Recommend annual/bilateral ultrasound follow-up of 2.4 cm left mid TR 3 nodule. The above is in keeping with the ACR TI-RADS recommendations - J Am Coll Radiol 2017;14:587-595. Electronically Signed   By: Corlis Leak M.D.   On: 03/09/2023 13:49   CT HEAD WO CONTRAST ( ) Result Date: 03/08/2023 CLINICAL DATA:  Mental status change, persistent or worsening. EXAM: CT HEAD WITHOUT CONTRAST TECHNIQUE: Contiguous axial images were obtained from the base of the skull through the vertex without intravenous contrast. RADIATION DOSE REDUCTION: This exam was performed according to the departmental dose-optimization program which includes automated exposure control, adjustment of the mA and/or kV according to patient size and/or use of iterative reconstruction technique. COMPARISON:  Head CT 12/30/2019 FINDINGS: The study is mildly motion degraded despite repeat imaging. Brain: There is no evidence of an acute large territory infarct intracranial hemorrhage, mass, midline shift, or  extra-axial fluid collection. Chronic infarcts are again seen in the anterior left MCA territory, posterior limb of the right internal capsule and mesial right temporal lobe with wallerian degeneration in the brainstem, and left cerebellar hemisphere. There is a background of extensive chronic small vessel ischemia in the cerebral white matter. There is mild cerebral atrophy. Vascular: Calcified atherosclerosis at the skull base. No hyperdense vessel. Skull: No acute fracture or suspicious osseous lesion. Sinuses/Orbits: Paranasal sinuses and mastoid air cells are clear. Bilateral cataract extraction. Other: None. IMPRESSION: 1. No evidence of acute intracranial abnormality. 2. Extensive chronic ischemia with multiple old infarcts. Electronically Signed   By: Sebastian Ache M.D.   On: 03/08/2023 15:39   DG Chest Port 1 View Result Date: 03/07/2023 CLINICAL DATA:  Left hip bedsore.  History of a left-sided stroke. EXAM: PORTABLE CHEST 1 VIEW COMPARISON:  07/22/2020. FINDINGS: Cardiac silhouette normal in size.  No mediastinal or hilar masses. Clear lungs.  No pleural effusion or pneumothorax. Skeletal structures are grossly intact. IMPRESSION: No active disease. Electronically Signed   By: Amie Portland M.D.   On: 03/07/2023 17:51    Microbiology: Results for orders placed or performed during the hospital encounter of 03/07/23  Culture, blood (Routine x 2)     Status: None (Preliminary result)   Collection Time: 03/07/23  5:29 PM   Specimen: BLOOD  Result Value Ref Range Status   Specimen Description BLOOD RIGHT ANTECUBITAL  Final   Special Requests   Final    BOTTLES DRAWN AEROBIC AND ANAEROBIC Blood Culture results may not be optimal due to an inadequate volume of blood received in culture bottles   Culture   Final    NO GROWTH 4 DAYS Performed at [facility], [address] [place], [place] [address]    Report Status PENDING  Incomplete  Culture, blood (Routine x 2)     Status: None  (Preliminary result)   Collection Time: 03/07/23  5:29 PM   Specimen: BLOOD  Result Value Ref Range Status   Specimen Description BLOOD BLOOD RIGHT FOREARM  Final   Special Requests   Final    BOTTLES DRAWN AEROBIC AND ANAEROBIC Blood Culture adequate volume   Culture   Final    NO GROWTH 4 DAYS Performed at [facility],  [address] [place], [place] [address]    Report Status PENDING  Incomplete    Labs: CBC: Recent Labs  Lab 03/07/23 1729 03/08/23 0959  WBC 7.1 5.5  NEUTROABS 5.1 3.5  HGB 13.8 12.0  HCT 43.6 37.0  MCV 82.1 81.0  PLT 316 246   Basic Metabolic Panel: Recent Labs  Lab 03/07/23 1729 03/08/23 0959  NA 136 136  K 3.4* 4.1  CL 96* 104  CO2 25 20*  GLUCOSE 126* 125*  BUN 24* 20  CREATININE 0.60 0.64  CALCIUM 9.1 8.6*   Liver Function Tests: Recent Labs  Lab 03/07/23 1729  AST 16  ALT 10  ALKPHOS 76  BILITOT 1.6*  PROT 7.4  ALBUMIN 3.4*   CBG: Recent Labs  Lab 03/09/23 1601  GLUCAP 155*    Discharge time spent: greater than 30 minutes.  Signed: Marrion Coy, MD [facility] 03/11/2023

## 2023-03-11 NOTE — Discharge Instructions (Signed)
[facility]. Open  Closes 5:00 PM. Call us [phone]. [address] [place], [place]

## 2023-03-11 NOTE — Plan of Care (Signed)
Patient ID: Molly [name], female   DOB: June 07, 1945, 77 y.o.   MRN: [id number]  Problem: Acute Rehab PT Goals(only PT should resolve) Goal: Pt Will Go Supine/Side To Sit Outcome: Adequate for Discharge Goal: Patient Will Transfer Sit To/From Stand Outcome: Adequate for Discharge Goal: Pt Will Transfer Bed To Chair/Chair To Bed Outcome: Adequate for Discharge Goal: Pt Will Ambulate Outcome: Adequate for Discharge   Problem: Acute Rehab OT Goals (only OT should resolve) Goal: Pt. Will Perform Eating Outcome: Adequate for Discharge Goal: Pt. Will Perform Grooming Outcome: Adequate for Discharge Goal: OT Additional ADL Goal #1 Outcome: Adequate for Discharge   Problem: Education: Goal: Knowledge of General Education information will improve Description: Including pain rating scale, medication(s)/side effects and non-pharmacologic comfort measures 03/11/2023 1851 by Lidia Collum, RN Outcome: Adequate for Discharge 03/11/2023 1850 by Lidia Collum, RN Outcome: Adequate for Discharge   Problem: Health Behavior/Discharge Planning: Goal: Ability to manage health-related needs will improve 03/11/2023 1851 by Lidia Collum, RN Outcome: Adequate for Discharge 03/11/2023 1850 by Lidia Collum, RN Outcome: Adequate for Discharge   Problem: Clinical Measurements: Goal: Ability to maintain clinical measurements within normal limits will improve 03/11/2023 1851 by Lidia Collum, RN Outcome: Adequate for Discharge 03/11/2023 1850 by Lidia Collum, RN Outcome: Adequate for Discharge Goal: Will remain free from infection 03/11/2023 1851 by Lidia Collum, RN Outcome: Adequate for Discharge 03/11/2023 1850 by Lidia Collum, RN Outcome: Adequate for Discharge Goal: Diagnostic test results will improve 03/11/2023 1851 by Lidia Collum, RN Outcome: Adequate for Discharge 03/11/2023 1850 by Lidia Collum, RN Outcome: Adequate for Discharge Goal:  Respiratory complications will improve 03/11/2023 1851 by Lidia Collum, RN Outcome: Adequate for Discharge 03/11/2023 1850 by Lidia Collum, RN Outcome: Adequate for Discharge Goal: Cardiovascular complication will be avoided 03/11/2023 1851 by Lidia Collum, RN Outcome: Adequate for Discharge 03/11/2023 1850 by Lidia Collum, RN Outcome: Adequate for Discharge   Problem: Activity: Goal: Risk for activity intolerance will decrease 03/11/2023 1851 by Lidia Collum, RN Outcome: Adequate for Discharge 03/11/2023 1850 by Lidia Collum, RN Outcome: Adequate for Discharge   Problem: Nutrition: Goal: Adequate nutrition will be maintained 03/11/2023 1851 by Lidia Collum, RN Outcome: Adequate for Discharge 03/11/2023 1850 by Lidia Collum, RN Outcome: Adequate for Discharge   Problem: Coping: Goal: Level of anxiety will decrease 03/11/2023 1851 by Lidia Collum, RN Outcome: Adequate for Discharge 03/11/2023 1850 by Lidia Collum, RN Outcome: Adequate for Discharge   Problem: Elimination: Goal: Will not experience complications related to bowel motility 03/11/2023 1851 by Lidia Collum, RN Outcome: Adequate for Discharge 03/11/2023 1850 by Lidia Collum, RN Outcome: Adequate for Discharge Goal: Will not experience complications related to urinary retention 03/11/2023 1851 by Lidia Collum, RN Outcome: Adequate for Discharge 03/11/2023 1850 by Lidia Collum, RN Outcome: Adequate for Discharge   Problem: Pain Management: Goal: General experience of comfort will improve 03/11/2023 1851 by Lidia Collum, RN Outcome: Adequate for Discharge 03/11/2023 1850 by Lidia Collum, RN Outcome: Adequate for Discharge   Problem: Safety: Goal: Ability to remain free from injury will improve 03/11/2023 1851 by Lidia Collum, RN Outcome: Adequate for Discharge 03/11/2023 1850 by Lidia Collum, RN Outcome: Adequate  for Discharge   Problem: Skin Integrity: Goal: Risk for impaired skin integrity will decrease 03/11/2023 1851 by Lidia Collum, RN Outcome: Adequate for Discharge 03/11/2023 1850 by Pia Mau  D, RN Outcome: Adequate for Discharge   Problem: Clinical Measurements: Goal: Ability to avoid or minimize complications of infection will improve 03/11/2023 1851 by Lidia Collum, RN Outcome: Adequate for Discharge 03/11/2023 1850 by Lidia Collum, RN Outcome: Adequate for Discharge   Problem: Skin Integrity: Goal: Skin integrity will improve 03/11/2023 1851 by Lidia Collum, RN Outcome: Adequate for Discharge 03/11/2023 1850 by Lidia Collum, RN Outcome: Adequate for Discharge   Problem: Malnutrition  (NI-5.2) Goal: Food and/or nutrient delivery Description: Individualized approach for food/nutrient provision. Outcome: Adequate for Discharge    Lidia Collum, RN

## 2023-03-11 NOTE — Final Progress Note (Signed)
Patient ID: Molly [name], female   DOB: 1946-03-19, 77 y.o.   MRN: [id number]  Had phone conversation with both husband and brother to go over AVS discharge instructions. Both verbalized understanding. AVS placed in EMS packet.  Lidia Collum, RN

## 2023-03-11 NOTE — TOC Progression Note (Addendum)
Transition of Care [facility]) - Progression Note    Patient Details  Name: Molly [name] MRN: [id number] Date of Birth: 17-Dec-1945  [facility] CM/SW Contact  Marlowe Sax, RN Phone Number: 03/11/2023, 3:36 PM  Clinical Narrative:     Attempted to reach the patients husband Ronne Binning, and was not able to reach him, I called her brother, he stated that they did not go to [facility] due to husband not wanting to, she has a hospital bed, WC, 3 in1, and sliding board He said that she does not need additional DME, they are going to attempt to help the husband to try and get CAP application  HH to be set up, Sent referral to [facility]   Expected Discharge Plan:  (TBD) Barriers to Discharge: Continued Medical Work up  Expected Discharge Plan and Services   Discharge Planning Services: CM Consult   Living arrangements for the past 2 months: Single Family Home Expected Discharge Date: 03/11/23                                     Social Determinants of Health (SDOH) Interventions SDOH Screenings   Food Insecurity: Patient Unable To Answer (03/09/2023)  Housing: Patient Unable To Answer (03/09/2023)  Transportation Needs: Patient Unable To Answer (03/09/2023)  Utilities: Patient Unable To Answer (03/09/2023)  Tobacco Use: Medium Risk (03/09/2023)    Readmission Risk Interventions    03/08/2023    4:53 PM  Readmission Risk Prevention Plan  Post Dischage Appt Complete  Medication Screening Complete

## 2023-03-11 NOTE — TOC Progression Note (Signed)
Transition of Care [facility]) - Progression Note    Patient Details  Name: Molly [name] MRN: [id number] Date of Birth: [birth year]  [facility]) CM/SW Contact  Marlowe Sax, RN Phone Number: 03/11/2023, 4:59 PM  Clinical Narrative:     Spoke with the patients brother, he assured me that someeone would be there when EMS arrives to transport EMS called to arrange transport, 4 ahead of her Expected Discharge Plan: Home w Home Health Services Barriers to Discharge: Continued Medical Work up  Expected Discharge Plan and Services   Discharge Planning Services: CM Consult   Living arrangements for the past 2 months: Single Family Home Expected Discharge Date: 03/11/23                         HH Arranged: PT, OT, Nurse's Aide, RN [facility] Agency: [facility] Date [facility] Agency Contacted: 03/11/23 Time HH Agency Contacted: [phone] Representative spoke with at [facility] Agency: [place]   Social Determinants of Health (SDOH) Interventions SDOH Screenings   Food Insecurity: Patient Unable To Answer (03/09/2023)  Housing: Patient Unable To Answer (03/09/2023)  Transportation Needs: Patient Unable To Answer (03/09/2023)  Utilities: Patient Unable To Answer (03/09/2023)  Tobacco Use: Medium Risk (03/09/2023)    Readmission Risk Interventions    03/08/2023    4:53 PM  Readmission Risk Prevention Plan  Post Dischage Appt Complete  Medication Screening Complete

## 2023-03-11 NOTE — Progress Notes (Signed)
Progress Note   Patient: Molly [name] MWU:[id number] DOB: [birth year] DOA: 03/07/2023     4 DOS: the patient was seen and examined on 03/11/2023   Brief hospital course: Molly [name] is a 77 y.o. female with medical history significant for diabetes mellitus type 2, chronic anemia, cellulitis, COPD, hypertension, history of strokes, history of GI bleeds, history of C. difficile colitis, sacral decub. Presented to the ED from home due to concern for her decubitus ulcer.  Initially treated with broad-spectrum antibiotics, seen by wound care, no evidence of osteomyelitis, wound culture negative.  Antibiotic switched to oral Augmentin. Currently, patient is stable for discharge, pending nursing home placement.   Principal Problem:   Sacral wound Active Problems:   Type 2 diabetes mellitus with hyperlipidemia (HCC)   COPD (chronic obstructive pulmonary disease) (HCC)   Essential hypertension   Wound infection   Sacral decubitus ulcer   Malnutrition of moderate degree   Hemiparesis affecting left side as late effect of cerebrovascular accident (CVA) (HCC)   Assessment and Plan: Acute metabolic encephalopathy ruled out. History of stroke with left hemiplegia. Vascular dementia with behavioral abnormality. Failure to thrive Discussed with patient brother, patient husband also has medical problems.  She has difficulty getting up, she also had a history of stroke with left sided weakness.  At this point, it is no longer safe for patient to stay at home.  Currently TOC is looking for nursing home placement.   Sacral decubitus wound POA- stage 3. 2cmx2cm. MRI on previous admission was negative for osteomyelitis and wound cultures were negative at that time. Of note, has smaller ulcers/wound on buttocks, R foot, and forearms Patient initially treated with vancomycin, discontinued, do not see any deep infection.  Will continue Augmentin for 5 days. Pressure Injury 03/08/23 Sacrum Lower;Mid  Stage 3 -  Full thickness tissue loss. Subcutaneous fat may be visible but bone, tendon or muscle are NOT exposed. 2 cm x 2 cm x 0.1 cm 80% pink moist 20% tan (Active)  03/08/23   Location: Sacrum  Location Orientation: Lower;Mid  Staging: Stage 3 -  Full thickness tissue loss. Subcutaneous fat may be visible but bone, tendon or muscle are NOT exposed.  Wound Description (Comments): 2 cm x 2 cm x 0.1 cm 80% pink moist 20% tan  Present on Admission: Yes     Pressure Injury 03/08/23 Buttocks Medial;Right Stage 2 -  Partial thickness loss of dermis presenting as a shallow open injury with a red, pink wound bed without slough. 1 cm x 1 cm x 0.1 cm 100% pink moist (Active)  03/08/23   Location: Buttocks  Location Orientation: Medial;Right  Staging: Stage 2 -  Partial thickness loss of dermis presenting as a shallow open injury with a red, pink wound bed without slough.  Wound Description (Comments): 1 cm x 1 cm x 0.1 cm 100% pink moist  Present on Admission: Yes       Hyperthyroidism.  Thyroid nodule. Patient has an detectable TSH and increased T4.  Reviewed patient medication list, patient was not been taking Synthroid.  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  Will start methimazole at a lower dose. Thyroid ultrasound showed 2 thyroid nodules, recommended fine-needle biopsy for 1.  This can be done as outpatient, currently patient has advanced dementia, poor prognosis, biopsy may not provide any value for the patient.   Type II DM-  - monitor blood sugars.      COPD- not in acute flare  Subjective:  Patient is confused, no agitation.  Physical Exam: Vitals:   03/10/23 0804 03/10/23 1518 03/10/23 2110 03/11/23 0500  BP: 105/70 110/62 131/73   Pulse: 69 86 88   Resp: 15 16 16    Temp:   98.2 F (36.8 C)   TempSrc:   Oral   SpO2: 100% 100% 100%   Weight:    61.7 kg  Height:       General exam: Appears calm and comfortable  Respiratory system: Clear to  auscultation. Respiratory effort normal. Cardiovascular system: S1 & S2 heard, RRR. No JVD, murmurs, rubs, gallops or clicks. No pedal edema. Gastrointestinal system: Abdomen is nondistended, soft and nontender. No organomegaly or masses felt. Normal bowel sounds heard. Central nervous system: Alert and oriented x1.  Left hemiplegia. Extremities: Symmetric 5 x 5 power. Skin: No rashes, lesions or ulcers Psychiatry: Flat affect   Data Reviewed:  Lab results reviewed.  Family Communication: None  Disposition: Status is: Inpatient Remains inpatient appropriate because: Unsafe discharge option.     Time spent: 35 minutes  Author: Marrion Coy, MD 03/11/2023 12:30 PM  For on call review [email or web address].

## 2023-03-12 ENCOUNTER — Other Ambulatory Visit: Payer: Self-pay

## 2023-03-12 ENCOUNTER — Emergency Department
Admission: EM | Admit: 2023-03-12 | Discharge: 2023-03-22 | Disposition: A | Discharge status: Home / Self Care | Payer: Medicare HMO | Attending: Emergency Medicine | Admitting: Emergency Medicine

## 2023-03-12 DIAGNOSIS — Z4800 Encounter for change or removal of nonsurgical wound dressing: Secondary | ICD-10-CM | POA: Diagnosis not present

## 2023-03-12 DIAGNOSIS — L89151 Pressure ulcer of sacral region, stage 1: Secondary | ICD-10-CM | POA: Diagnosis present

## 2023-03-12 DIAGNOSIS — R531 Weakness: Secondary | ICD-10-CM

## 2023-03-12 DIAGNOSIS — Z5189 Encounter for other specified aftercare: Secondary | ICD-10-CM

## 2023-03-12 LAB — CULTURE, BLOOD (ROUTINE X 2)
Culture: NO GROWTH
Culture: NO GROWTH
Special Requests: ADEQUATE

## 2023-03-12 NOTE — ED Triage Notes (Signed)
Pt comes via EMs from home with needing placement. Pt was seen here in hospital on 12/8 for wound to left hip. Pt dc home to husband. Pt husband now can't take care of pt.

## 2023-03-12 NOTE — TOC Progression Note (Signed)
Transition of Care [facility]) - Progression Note    Patient Details  Name: Molly [name] MRN: [id number] Date of Birth: 02-16-1946  Transition of Care [facility]) CM/SW Contact  Marlowe Sax, RN Phone Number: 03/12/2023, 2:26 PM  Clinical Narrative:    Received a message from [facility] stating that the husband is not able to care for the patient and bringing her back to the Hospital, before they can see the patient she will have to see the PCP first. The husband refused to do anything before DC other than take her home, Her Brother Dennard Nip is also trying to talk to the husband about getting more help than they currently have at home   Expected Discharge Plan: Home w Home Health Services Barriers to Discharge: Continued Medical Work up  Expected Discharge Plan and Services   Discharge Planning Services: CM Consult   Living arrangements for the past 2 months: Single Family Home Expected Discharge Date: 03/11/23                         HH Arranged: PT, OT, Nurse's Aide, RN [facility] Agency: [facility] Date [facility] Agency Contacted: 03/11/23 Time HH Agency Contacted: [phone] Representative spoke with at [facility] Agency: [place]   Social Determinants of Health (SDOH) Interventions SDOH Screenings   Food Insecurity: Patient Unable To Answer (03/09/2023)  Housing: Patient Unable To Answer (03/09/2023)  Transportation Needs: Patient Unable To Answer (03/09/2023)  Utilities: Patient Unable To Answer (03/09/2023)  Tobacco Use: Medium Risk (03/09/2023)    Readmission Risk Interventions    03/08/2023    4:53 PM  Readmission Risk Prevention Plan  Post Dischage Appt Complete  Medication Screening Complete

## 2023-03-13 LAB — URINALYSIS, ROUTINE W REFLEX MICROSCOPIC
Bilirubin Urine: NEGATIVE
Glucose, UA: NEGATIVE mg/dL
Hgb urine dipstick: NEGATIVE
Ketones, ur: NEGATIVE mg/dL
Nitrite: NEGATIVE
Protein, ur: NEGATIVE mg/dL
Specific Gravity, Urine: 1.012 (ref 1.005–1.030)
pH: 5 (ref 5.0–8.0)

## 2023-03-13 LAB — CBC WITH DIFFERENTIAL/PLATELET
Abs Immature Granulocytes: 0.02 10*3/uL (ref 0.00–0.07)
Basophils Absolute: 0 10*3/uL (ref 0.0–0.1)
Basophils Relative: 0 %
Eosinophils Absolute: 0.1 10*3/uL (ref 0.0–0.5)
Eosinophils Relative: 2 %
HCT: 38.6 % (ref 36.0–46.0)
Hemoglobin: 12.1 g/dL (ref 12.0–15.0)
Immature Granulocytes: 0 %
Lymphocytes Relative: 27 %
Lymphs Abs: 1.4 10*3/uL (ref 0.7–4.0)
MCH: 26.3 pg (ref 26.0–34.0)
MCHC: 31.3 g/dL (ref 30.0–36.0)
MCV: 83.9 fL (ref 80.0–100.0)
Monocytes Absolute: 0.4 10*3/uL (ref 0.1–1.0)
Monocytes Relative: 8 %
Neutro Abs: 3.5 10*3/uL (ref 1.7–7.7)
Neutrophils Relative %: 63 %
Platelets: 215 10*3/uL (ref 150–400)
RBC: 4.6 MIL/uL (ref 3.87–5.11)
RDW: 14.7 % (ref 11.5–15.5)
WBC: 5.4 10*3/uL (ref 4.0–10.5)
nRBC: 0 % (ref 0.0–0.2)

## 2023-03-13 LAB — TROPONIN I (HIGH SENSITIVITY): Troponin I (High Sensitivity): 7 ng/L (ref <18)

## 2023-03-13 LAB — COMPREHENSIVE METABOLIC PANEL
ALT: 18 U/L (ref 0–44)
AST: 39 U/L (ref 15–41)
Albumin: 3 g/dL — ABNORMAL LOW (ref 3.5–5.0)
Alkaline Phosphatase: 83 U/L (ref 38–126)
Anion gap: 6 (ref 5–15)
BUN: 13 mg/dL (ref 8–23)
CO2: 30 mmol/L (ref 22–32)
Calcium: 9.1 mg/dL (ref 8.9–10.3)
Chloride: 102 mmol/L (ref 98–111)
Creatinine, Ser: 0.54 mg/dL (ref 0.44–1.00)
GFR, Estimated: >60 mL/min (ref >60)
Glucose, Bld: 103 mg/dL — ABNORMAL HIGH (ref 70–99)
Potassium: 4.3 mmol/L (ref 3.5–5.1)
Sodium: 138 mmol/L (ref 135–145)
Total Bilirubin: 0.8 mg/dL (ref <1.2)
Total Protein: 6.6 g/dL (ref 6.5–8.1)

## 2023-03-13 MED ORDER — AMOXICILLIN-POT CLAVULANATE 875-125 MG PO TABS
1.0000 | ORAL_TABLET | Freq: Two times a day (BID) | ORAL | Status: AC
  Administered 2023-03-13 – 2023-03-14 (×4): 1 via ORAL
  Filled 2023-03-13 (×4): qty 1

## 2023-03-13 MED ORDER — METHIMAZOLE 5 MG PO TABS
5.0000 mg | ORAL_TABLET | Freq: Every day | ORAL | Status: DC
  Administered 2023-03-13 – 2023-03-22 (×10): 5 mg via ORAL
  Filled 2023-03-13 (×10): qty 1

## 2023-03-13 MED ORDER — ASPIRIN 81 MG PO TBEC
81.0000 mg | DELAYED_RELEASE_TABLET | Freq: Every day | ORAL | Status: DC
  Administered 2023-03-13 – 2023-03-22 (×10): 81 mg via ORAL
  Filled 2023-03-13 (×10): qty 1

## 2023-03-13 MED ORDER — PROPOFOL 1000 MG/100ML IV EMUL
INTRAVENOUS | Status: AC
  Filled 2023-03-13: qty 100

## 2023-03-13 NOTE — ED Notes (Signed)
Pt is currently sleeping still breakfast tray placed at bedside.

## 2023-03-13 NOTE — ED Notes (Addendum)
Pt given medication in apple sauce. Pt sat upright and given cut up bites of food by RN. Brief dry at this time.

## 2023-03-13 NOTE — ED Provider Notes (Signed)
-----------------------------------------   5:55 AM on 03/13/2023 -----------------------------------------   Blood pressure (!) 141/67, pulse 72, temperature (!) 97.2 F (36.2 C), temperature source Oral, resp. rate 18, height 5\' 6"  (1.676 m), weight 61.7 kg, SpO2 100%.  The patient is calm and cooperative at this time.  There have been no acute events since the last update.  Awaiting disposition plan from Social Work team.   Irean Hong, MD 03/13/23 [phone]

## 2023-03-13 NOTE — ED Notes (Signed)
This tech fed pt her dinner. Pt ate 100 percent of her meal.This tech also obtained vital signs as well.

## 2023-03-13 NOTE — ED Provider Notes (Signed)
[facility] Provider Note    Event Date/Time   First MD Initiated Contact with Patient 03/12/23 2350     (approximate)   History   Wound Check   HPI  Molly [name] is a 77 y.o. female who presents to the ED for evaluation of Wound Check   I review a medical DC summary from 12/12.  Sacral decubitus ulcer.  Previous stroke with hemiparesis and bedbound status.  Were planning on SNF placement but husband took the patient home with home health  Patient returns to the ED because her husband could not care for her at home.  She has no acute complaints  Physical Exam   Triage Vital Signs: ED Triage Vitals  Encounter Vitals Group     BP 03/12/23 1501 131/71     Systolic BP Percentile --      Diastolic BP Percentile --      Pulse Rate 03/12/23 1501 70     Resp 03/12/23 1501 19     Temp 03/12/23 1501 (!) 97.5 F (36.4 C)     Temp Source 03/12/23 2354 Oral     SpO2 03/12/23 1501 100 %     Weight 03/12/23 1455 136 lb 0.4 oz (61.7 kg)     Height 03/12/23 1455 5\' 6"  (1.676 m)     Head Circumference --      Peak Flow --      Pain Score 03/12/23 1455 2     Pain Loc --      Pain Education --      Exclude from Growth Chart --     Most recent vital signs: Vitals:   03/12/23 1501 03/12/23 2354  BP: 131/71 (!) 141/67  Pulse: 70 72  Resp: 19 18  Temp: (!) 97.5 F (36.4 C) (!) 97.2 F (36.2 C)  SpO2: 100% 100%    General: Awake, no distress.  CV:  Good peripheral perfusion.  Resp:  Normal effort.  Abd:  No distention.  MSK:  No deformity noted.  Neuro:  No focal deficits appreciated. Other:  Stage I/II sacral decubitus ulcer.  No superimposed infectious features.  Had a clean Mepilex overlying this   ED Results / Procedures / Treatments   Labs (all labs ordered are listed, but only abnormal results are displayed) Labs Reviewed  COMPREHENSIVE METABOLIC PANEL - Abnormal; Notable for the following components:      Result Value   Glucose, Bld  103 (*)    Albumin 3.0 (*)    All other components within normal limits  CBC WITH DIFFERENTIAL/PLATELET  URINALYSIS, ROUTINE W REFLEX MICROSCOPIC  TROPONIN I (HIGH SENSITIVITY)    EKG   RADIOLOGY   Official radiology report(s): No results found.  PROCEDURES and INTERVENTIONS:  Procedures  Medications - No data to display   IMPRESSION / MDM / ASSESSMENT AND PLAN / ED COURSE  I reviewed the triage vital signs and the nursing notes.  Patient returns without acute complaints with inability to care for herself at home.  No evidence of acute medical pathologies to necessitate readmission.  We will contact social work and work on placement here.  Awaiting home medication reconciliation prior to reordering her home meds.      FINAL CLINICAL IMPRESSION(S) / ED DIAGNOSES   Final diagnoses:  Visit for wound check     Rx / DC Orders   ED Discharge Orders     None        Note:  This  document was prepared using Conservation officer, historic buildings and may include unintentional dictation errors.   Delton Prairie, MD 03/13/23 [phone]

## 2023-03-14 NOTE — ED Notes (Signed)
This Tech assisted patient with eating breakfast. Patient ate 80% of breakfast tray and 100% of Orange Juice. Pt is now resting comfortably. No other needs expressed to this tech at this time.

## 2023-03-14 NOTE — Evaluation (Signed)
Physical Therapy Evaluation Patient Details Name: Molly [name] MRN: [id number] DOB: [birth year] Today's Date: 03/14/2023  History of Present Illness  Patient is a 77 year old female coming from home with recent discharged from [facility] with therapy recommendation for SNF. Spouse unable to care for patient at home. History of hyperlipidemia, depression, anxiety, CVA with left-sided residual weakness, diabetes mellitus, dementia, hypertension, GERD, decubitus ulcer.   Clinical Impression  Patient is confused but cooperative during session. She requires significant assistance with all mobility, including rolling for repositioning in bed. Recommend rehabilitation < 3 hours/day after this hospital stay as family with difficulty caring for patient at home after recent hospital discharge. PT will follow to maximize independence and decrease caregiver burden.       If plan is discharge home, recommend the following: Two people to help with walking and/or transfers;Two people to help with bathing/dressing/bathroom;Assist for transportation;Help with stairs or ramp for entrance;Supervision due to cognitive status;Assistance with feeding;Direct supervision/assist for medications management;Direct supervision/assist for financial management;Assistance with cooking/housework   Can travel by private vehicle   No    Equipment Recommendations  (to be determined at next level of care)  Recommendations for Other Services       Functional Status Assessment Patient has had a recent decline in their functional status and demonstrates the ability to make significant improvements in function in a reasonable and predictable amount of time.     Precautions / Restrictions Precautions Precautions: Fall Restrictions Weight Bearing Restrictions Per Provider Order: No      Mobility  Bed Mobility Overal bed mobility: Needs Assistance Bed Mobility: Rolling Rolling: Total assist         General bed mobility  comments: total assistance for rolling to the left despite cues for technique. + 2 person assistance for boosting up to head of bed    Transfers                        Ambulation/Gait                  Stairs            Wheelchair Mobility     Tilt Bed    Modified Rankin (Stroke Patients Only)       Balance                                             Pertinent Vitals/Pain Pain Assessment Pain Assessment: No/denies pain    Home Living Family/patient expects to be discharged to:: Private residence Living Arrangements: Spouse/significant other                      Prior Function Prior Level of Function : Patient poor historian/Family not available             Mobility Comments: No family/caregiver present to determine baseline function and pt is unable to provide clear information regarding mobility status. States she has not walked "in a long time". Per therapy notes was MAX-TOTAL A +2 for mobility attempts ~ 1 week PTA. ADLs Comments: assistance required     Extremity/Trunk Assessment   Upper Extremity Assessment Upper Extremity Assessment: Generalized weakness LUE Deficits / Details: flexion contractures, limited active movement. see OT note LUE Coordination: decreased gross motor;decreased fine motor    Lower Extremity Assessment Lower Extremity Assessment: Generalized  weakness RLE Deficits / Details: foot plantaflexed with limited PROM LLE Deficits / Details: pain with PROM attempts. foot plantarflexed with PROM attempts       Communication   Communication Communication: Difficulty following commands/understanding;Difficulty communicating thoughts/reduced clarity of speech Following commands: Follows one step commands inconsistently;Follows one step commands with increased time Cueing Techniques: Verbal cues;Tactile cues  Cognition Arousal: Alert Behavior During Therapy: WFL for tasks  assessed/performed Overall Cognitive Status: No family/caregiver present to determine baseline cognitive functioning                                 General Comments: oriented to self only (first name).        General Comments General comments (skin integrity, edema, etc.): patient repositioned in bed and head of bed elevated at end of session for comfort and skin integrity    Exercises     Assessment/Plan    PT Assessment    PT Problem List         PT Treatment Interventions      PT Goals (Current goals can be found in the Care Plan section)  Acute Rehab PT Goals Patient Stated Goal: patient unable to participate with goal setting PT Goal Formulation: Patient unable to participate in goal setting Time For Goal Achievement: 03/28/23 Potential to Achieve Goals: Fair    Frequency       Co-evaluation               AM-PAC PT "6 Clicks" Mobility  Outcome Measure                  End of Session              Time: 1350-1402 PT Time Calculation (min) (ACUTE ONLY): 12 min   Charges:   PT Evaluation $PT Eval Low Complexity: 1 Low   PT General Charges $$ ACUTE PT VISIT: 1 Visit         Donna Bernard, PT, MPT  [employer] 03/14/2023, 2:24 PM

## 2023-03-14 NOTE — Evaluation (Signed)
Occupational Therapy Evaluation Patient Details Name: STEPHAN MCKERCHER MRN: [id number] DOB: 10-29-1945 Today's Date: 03/14/2023   History of Present Illness 77 y.o. female with medical history significant for hyperlipidemia, depression, anxiety, CVA with left-sided residual weakness, insulin-dependent mellitus, dementia, history hypertension, GERD, presents to the emergency department for concerns of decubitus ulcer. Of note, pt recently DC'd fro [facility] on 03/11/23 with therapy recommendations for SNF, however, spouse elected to take pt home with home health. Per notes, spouse was unable to care for pt at home and [facility] was not initiated prior to pt returning to ED.   Clinical Impression   Ms. Bata was seen for OT evaluation this date. Pt presents pleasantly confused, no caregiver at bedside to cofirm PLOF. Per chart, pt required MIN A for UB ADL management at bed level, MAX A +2 for bed mobility ~1 week PTA. Pt presents to acute OT demonstrating impaired ADL performance and functional mobility 2/2 generalized weakness, decreased UE functional use, and decreased cognition (See OT problem list for additional functional deficits). Pt currently requires MAX A for UB ADL management at bed level with consistent cueing for sequencing. TOTAL A to re-position in bed. Anticipate MAX-TOTAL A +2 for functional mobility attempts including sup<>sit t/fs and to maintain sitting balance at EOB.  Pt would benefit from skilled OT services to address noted impairments and functional limitations (see below for any additional details) in order to maximize safety and independence while minimizing falls risk and caregiver burden. Anticipate the need for follow up OT services upon acute hospital DC.        If plan is discharge home, recommend the following: Two people to help with walking and/or transfers;Two people to help with bathing/dressing/bathroom;Direct supervision/assist for medications management;Supervision due to  cognitive status;Direct supervision/assist for financial management;Assist for transportation;Assistance with cooking/housework;Assistance with feeding;Help with stairs or ramp for entrance    Functional Status Assessment  Patient has had a recent decline in their functional status and demonstrates the ability to make significant improvements in function in a reasonable and predictable amount of time.  Equipment Recommendations  None recommended by OT    Recommendations for Other Services       Precautions / Restrictions Precautions Precautions: Fall Restrictions Weight Bearing Restrictions Per Provider Order: No      Mobility Bed Mobility Overal bed mobility: Needs Assistance Bed Mobility: Rolling Rolling: Total assist              Transfers                   General transfer comment: unsafe to attempt      Balance                                           ADL either performed or assessed with clinical judgement   ADL Overall ADL's : Needs assistance/impaired                                       General ADL Comments: Pt currently requires MOD-MAX A for grooming, feeding, and UB ADL management at bed level. TOTAL A (likely +2) for bed level LB ADL management including toileting and bathing. Pt is unable to roll R<>L without significant assist. Requires TOTAL A with re-positioning in gurney bed this date. Per  notes, has required assistance from hospital staff for all meals.     Vision Patient Visual Report: No change from baseline       Perception         Praxis         Pertinent Vitals/Pain Pain Assessment Pain Assessment: No/denies pain (Pt states "I just feel bleh" repeatedly. Denies pain.)     Extremity/Trunk Assessment Upper Extremity Assessment Upper Extremity Assessment: Generalized weakness;Right hand dominant LUE Deficits / Details: hx CVA affecting L side, increased tone throughout, very limited  AROM with contractures LUE Coordination: decreased gross motor;decreased fine motor   Lower Extremity Assessment Lower Extremity Assessment: Defer to PT evaluation;LLE deficits/detail;RLE deficits/detail;Generalized weakness RLE Deficits / Details: noted for pt PF contracture LLE Deficits / Details: noted for significant pain with mobility, unable to bed knee without assist, noted for pt PF contracture       Communication Communication Communication: Difficulty following commands/understanding;Difficulty communicating thoughts/reduced clarity of speech Cueing Techniques: Verbal cues;Tactile cues   Cognition Arousal: Lethargic Behavior During Therapy: WFL for tasks assessed/performed Overall Cognitive Status: No family/caregiver present to determine baseline cognitive functioning                                 General Comments: oriented to self only, requires cues to follow simple commands     General Comments       Exercises Other Exercises Other Exercises: Pt educated on role of OT in acute seetting, bed mobility techniques, ROM exercises for RUE/BLE. Limited by cognition. No family/caregiver present for edu.   Shoulder Instructions      Home Living Family/patient expects to be discharged to:: Private residence Living Arrangements: Spouse/significant other                                      Prior Functioning/Environment Prior Level of Function : Patient poor historian/Family not available             Mobility Comments: No family/caregiver present to determine baseline function and pt is unable to provide clear information regarding mobility status. States she has not walked "in a long time". Per therapy notes was MAX-TOTAL A +2 for mobility attempts ~ 1 week PTA. ADLs Comments: Appears near total care at baseline, however, pt states she likes to do "some things" for herself including washing her face and feeding herself.        OT  Problem List: Decreased strength;Decreased coordination;Pain;Decreased cognition;Decreased range of motion;Decreased activity tolerance;Impaired tone;Decreased safety awareness;Decreased knowledge of use of DME or AE;Impaired balance (sitting and/or standing);Impaired UE functional use      OT Treatment/Interventions: Self-care/ADL training;Therapeutic exercise;Therapeutic activities;Cognitive remediation/compensation;Neuromuscular education;DME and/or AE instruction;Patient/family education;Balance training    OT Goals(Current goals can be found in the care plan section) Acute Rehab OT Goals Patient Stated Goal: To be able to get out of bed more OT Goal Formulation: With patient Time For Goal Achievement: 03/28/23 Potential to Achieve Goals: Fair ADL Goals Pt Will Perform Eating: sitting;with min assist Pt Will Perform Grooming: sitting;with min assist Pt Will Perform Upper Body Bathing: bed level;sitting;with mod assist  OT Frequency: Min 1X/week    Co-evaluation              AM-PAC OT "6 Clicks" Daily Activity     Outcome Measure Help from another person eating meals?: A Lot  Help from another person taking care of personal grooming?: A Lot Help from another person toileting, which includes using toliet, bedpan, or urinal?: Total Help from another person bathing (including washing, rinsing, drying)?: Total Help from another person to put on and taking off regular upper body clothing?: A Lot Help from another person to put on and taking off regular lower body clothing?: Total 6 Click Score: 9   End of Session Nurse Communication: Mobility status  Activity Tolerance: Patient tolerated treatment well Patient left: in bed (In hall gurney bed)  OT Visit Diagnosis: Other abnormalities of gait and mobility (R26.89);Other symptoms and signs involving cognitive function                Time: 1038-1050 OT Time Calculation (min): 12 min Charges:  OT General Charges $OT Visit: 1  Visit OT Evaluation $OT Eval Moderate Complexity: 1 Mod  Michael Ventresca Smith Robert, M.S., OTR/L 03/14/23, 11:16 AM

## 2023-03-15 NOTE — ED Notes (Signed)
Patient remains asleep at this time.

## 2023-03-15 NOTE — ED Notes (Signed)
Patient fed dinner by EDT Cordelia Pen.

## 2023-03-15 NOTE — ED Notes (Signed)
Pericare provided to patient. Bedding, and gown changed. Pt given warm blankets.

## 2023-03-15 NOTE — [place] FL2 (Signed)
[place] MEDICAID FL2 LEVEL OF CARE FORM     IDENTIFICATION  Patient Name: Molly [name] Birthdate: [birth year] Sex: female Admission Date (Current Location): 03/12/2023  [facility] and [place] Number:  Chiropodist and Address:  [facility], [address] [place], [place] [address]      Provider Number: [id number]  Attending Physician Name and Address:  No att. providers found  Relative Name and Phone Number:  Harvest, Blevens (Spouse)  [phone] (Mobile    Current Level of Care: Hospital Recommended Level of Care: Skilled Nursing Facility Prior Approval Number:    Date Approved/Denied:   PASRR Number: [id number]  Discharge Plan: SNF    Current Diagnoses: Patient Active Problem List   Diagnosis Date Noted   Hemiparesis affecting left side as late effect of cerebrovascular accident (CVA) (HCC) 03/10/2023   Malnutrition of moderate degree 03/09/2023   Sacral decubitus ulcer 03/07/2023   Sacral wound 03/26/2021   Wound infection 03/26/2021   UTI (urinary tract infection) 03/26/2021   Microcytic anemia 03/26/2021   Constipation 07/24/2020   Megacolon 07/22/2020   Obesity, diabetes, and hypertension syndrome (HCC)    Vaginal bleeding    Acute urinary retention    Acute metabolic encephalopathy    Rectal bleeding    Weakness    Lethargy    History of completed stroke    GI bleeding 12/27/2019   Lower GI bleed 12/27/2019   C. 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   Cellulitis of left lower extremity 11/19/2014   COPD (chronic obstructive pulmonary  disease) (HCC) 11/19/2014   Essential hypertension 11/19/2014   HLD (hyperlipidemia) 11/19/2014   Sepsis (HCC) 11/19/2014   Type 2 diabetes mellitus with hyperlipidemia (HCC) 05/21/2014   Chronic anemia 05/21/2014    Orientation RESPIRATION BLADDER Height & Weight     Self, Time (Fluctuating)    Incontinent Weight: 136 lb 0.4 oz (61.7 kg) Height:  5\' 6"  (167.6 cm)  BEHAVIORAL SYMPTOMS/MOOD NEUROLOGICAL BOWEL NUTRITION STATUS      Incontinent    AMBULATORY STATUS COMMUNICATION OF NEEDS Skin   Limited Assist Verbally Other (Comment) (wound care no wound vac)                       Personal Care Assistance Level of Assistance    Bathing Assistance: Maximum assistance Feeding assistance: Maximum assistance Dressing Assistance: Maximum assistance     Functional Limitations Info    Sight Info: Impaired (glasses) Hearing Info: Adequate Speech Info: Adequate    SPECIAL CARE FACTORS FREQUENCY                       Contractures      Additional Factors Info  Code Status, Allergies Code Status Info: FULL Allergies Info: NKA           Current Medications (03/15/2023):  This is the current hospital active medication list Current Facility-Administered Medications  Medication Dose Route Frequency Provider Last Rate Last Admin   aspirin EC tablet 81 mg  81 mg Oral Daily  Delton Prairie, MD   81 mg at 03/15/23 1104   methimazole (TAPAZOLE) tablet 5 mg  5 mg Oral Daily Delton Prairie, MD   5 mg at 03/15/23 1104   Current Outpatient Medications  Medication Sig Dispense Refill   acetaminophen (TYLENOL) 325 MG tablet Take 650 mg by mouth every 6 (six) hours as needed for moderate pain or mild pain.     amoxicillin-clavulanate (AUGMENTIN) 875-125 MG tablet Take 1 tablet by mouth every 12 (twelve) hours for 4 days. 8 tablet 0   ascorbic acid (VITAMIN C) 500 MG tablet Take 1 tablet (500 mg total) by mouth 2 (two) times daily. 60 tablet 0   aspirin EC 81 MG tablet Take 81 mg by  mouth daily.      bisacodyl (DULCOLAX) 10 MG suppository Place 10 mg rectally every 3 (three) days. Hold for loose stool     Cholecalciferol (VITAMIN D3) 1000 UNITS CAPS Take 1,000 Units by mouth daily.     diclofenac sodium (VOLTAREN) 1 % GEL Apply 2 g topically 4 (four) times daily. To each shoulders     methimazole (TAPAZOLE) 5 MG tablet Take 1 tablet (5 mg total) by mouth daily. 30 tablet 0   Multiple Vitamin (MULTIVITAMIN WITH MINERALS) TABS tablet Take 1 tablet by mouth daily. 30 tablet 0   Nystatin (GERHARDT'S BUTT CREAM) CREA Apply 1 Application topically 2 (two) times daily. 60 each 0   OXYGEN Inhale 2 L into the lungs daily as needed (to keep O2 stats >90%).     polyethylene glycol (MIRALAX / GLYCOLAX) 17 g packet Take 17 g by mouth daily. Dissolved in 4 - 9 ounces of liquid. Hold for loose stools.     senna-docusate (SENOKOT-S) 8.6-50 MG tablet Take 2 tablets by mouth 2 (two) times daily.     simethicone (MYLICON) 80 MG chewable tablet Chew 160 mg by mouth daily as needed for flatulence.     zinc sulfate, 50mg  elemental zinc, 220 (50 Zn) MG capsule Take 1 capsule (220 mg total) by mouth daily. 30 capsule 0     Discharge Medications: Please see discharge summary for a list of discharge medications.  Relevant Imaging Results:  Relevant Lab Results:   Additional Information SSN: [id number]  Marquita Palms, LCSW

## 2023-03-15 NOTE — ED Notes (Signed)
Patient remains asleep at this time.  Respirations even and unlabored.   ?

## 2023-03-15 NOTE — ED Provider Notes (Signed)
Emergency Medicine Observation Re-evaluation Note  Molly [name] is a 77 y.o. female, seen on rounds today.  Pt initially presented to the ED for complaints of Wound Check  Currently, the patient is calm, no acute complaints.  Physical Exam  Blood pressure 137/84, pulse 82, temperature 97.8 F (36.6 C), temperature source Oral, resp. rate 14, height 5\' 6"  (1.676 m), weight 61.7 kg, SpO2 100%. Physical Exam General: NAD Lungs: CTAB Psych: not agitated  ED Course / MDM  EKG:    I have reviewed the labs performed to date as well as medications administered while in observation.  Recent changes in the last 24 hours include no acute events overnight.    Plan  Current plan is for TOC placement.   Sharman Cheek, MD 03/15/23 [phone]

## 2023-03-16 NOTE — TOC Progression Note (Addendum)
Transition of Care [facility]) - Progression Note    Patient Details  Name: SUMITA KOLBECK MRN: [id number] Date of Birth: 1945-08-25  [facility] of Care [facility]) CM/SW Contact  Marquita Palms, LCSW Phone Number: 03/16/2023, 11:40 AM  Clinical Narrative:     CSW awaiting phone call from [place] with [facility] concerning placement. Will update husband and team upon notice. 4:22 PM - csw reporting multiple calls to patients husband about possible placement for patient with [facility] @ [phone] Eunice Blase is the admission coordinator         Expected Discharge Plan and Services        Discharging to SNF                                        Social Determinants of Health (SDOH) Interventions SDOH Screenings   Food Insecurity: Patient Unable To Answer (03/09/2023)  Housing: Patient Unable To Answer (03/09/2023)  Transportation Needs: Patient Unable To Answer (03/09/2023)  Utilities: Patient Unable To Answer (03/09/2023)  Tobacco Use: Medium Risk (03/12/2023)    Readmission Risk Interventions    03/08/2023    4:53 PM  Readmission Risk Prevention Plan  Post Dischage Appt Complete  Medication Screening Complete

## 2023-03-16 NOTE — ED Notes (Signed)
Patient consumed 75% of meal with assistance by this nurse.

## 2023-03-16 NOTE — ED Notes (Signed)
Patient repositioned. Fluids provided. No complaints. Will continue to monitor.

## 2023-03-16 NOTE — TOC Progression Note (Signed)
Transition of Care [facility]) - Progression Note    Patient Details  Name: BRECKEN MICHALSKI MRN: [id number] Date of Birth: [birth year]  Transition of Care [facility]) CM/SW Contact  Susa Simmonds, [place] Phone Number: 03/16/2023, 6:47 PM  Clinical Narrative:  CSW contacted patients husband Donene Lasko, [phone] with no response. CSW contacted patients brother Dennard Nip, [phone]. Dennard Nip stated he has contacted the husband as well with no response. CSW explained that the hospital is trying to move forward with SNF placement at [facility]. Dennard Nip stated he will try to get in touch with patients husband and if he can't he will make a visit to the home and contact CSW back.    6:55 PM- CSW spoke with patients husband who accepts the bed offer at [facility]. CSW spoke with Eunice Blase in admissions who confirmed patients bed offer. CSW started patients insurance authorization in the [place] portal [phone])        Expected Discharge Plan and Services                                               Social Determinants of Health (SDOH) Interventions SDOH Screenings   Food Insecurity: Patient Unable To Answer (03/09/2023)  Housing: Patient Unable To Answer (03/09/2023)  Transportation Needs: Patient Unable To Answer (03/09/2023)  Utilities: Patient Unable To Answer (03/09/2023)  Tobacco Use: Medium Risk (03/12/2023)    Readmission Risk Interventions    03/08/2023    4:53 PM  Readmission Risk Prevention Plan  Post Dischage Appt Complete  Medication Screening Complete

## 2023-03-16 NOTE — ED Notes (Signed)
Patient ate 75% of breakfast (eggs, potatoes, apple sauce). Requires total assist for this ADL.

## 2023-03-16 NOTE — ED Notes (Signed)
Assisted patient to eat lunch, eats 100% of lunch with total assist with exception to finger food which she was able to feed to herself (cookies).

## 2023-03-16 NOTE — ED Notes (Addendum)
Patient cleaned from bm and urine. Clean linens placed (full) and brief. New mepilex placed.

## 2023-03-16 NOTE — Progress Notes (Signed)
Physical Therapy Treatment Patient Details Name: ELAYA HOSTEEN MRN: [id number] DOB: Apr 26, 1945 Today's Date: 03/16/2023   History of Present Illness Patient is a 77 year old female coming from home with recent discharged from [facility] with therapy recommendation for SNF. Spouse unable to care for patient at home. History of hyperlipidemia, depression, anxiety, CVA with left-sided residual weakness, diabetes mellitus, dementia, hypertension, GERD, decubitus ulcer.    PT Comments  Ready for session.  Participated in exercises as described below. Transitions to sitting with max a x 1 and remains sitting x 10 minutes. She is able to hold position briefly on her own but mostly needs min a x 1.  Stretcher height is a factor and pt is unable to place feet on floor.  With appropriate bed height I anticipate improved sitting balance and duration.  She is motivated and has made improvements since eval.  Returned to supine with max a x 1.  Pt does have difficulty laying flat to reposition due to discomfort and overall bad mobility is challenging for pt given back pan and L side weakness/contractures.   If plan is discharge home, recommend the following: Two people to help with walking and/or transfers;Two people to help with bathing/dressing/bathroom;Assist for transportation;Help with stairs or ramp for entrance;Supervision due to cognitive status;Assistance with feeding;Direct supervision/assist for medications management;Direct supervision/assist for financial management;Assistance with cooking/housework   Can travel by private vehicle        Equipment Recommendations       Recommendations for Other Services       Precautions / Restrictions Precautions Precautions: Fall Restrictions Weight Bearing Restrictions Per Provider Order: No     Mobility  Bed Mobility Overal bed mobility: Needs Assistance Bed Mobility: Supine to Sit, Sit to Supine     Supine to sit: Max assist Sit to supine: Max  assist        Transfers                   General transfer comment: unsafe to attempt    Ambulation/Gait                   Stairs             Wheelchair Mobility     Tilt Bed    Modified Rankin (Stroke Patients Only)       Balance Overall balance assessment: Needs assistance Sitting-balance support: Feet unsupported, Single extremity supported Sitting balance-Leahy Scale: Poor Sitting balance - Comments: overall improved today but limted due to stretcher and feet unable to hit floor.  able to maintain for short periods.  would do well on bed or mat table                                    Cognition Arousal: Alert Behavior During Therapy: WFL for tasks assessed/performed Overall Cognitive Status: Within Functional Limits for tasks assessed                                          Exercises Other Exercises Other Exercises: BLE AAROM x 10 in supine, RLE AROM in sitting    General Comments        Pertinent Vitals/Pain Pain Assessment Pain Assessment: Faces Faces Pain Scale: Hurts even more Pain Location: when trying to lie flat to reposition -  back Pain Descriptors / Indicators: Grimacing, Guarding, Moaning Pain Intervention(s): Limited activity within patient's tolerance, Monitored during session, Repositioned    Home Living                          Prior Function            PT Goals (current goals can now be found in the care plan section) Progress towards PT goals: Progressing toward goals    Frequency    Min 1X/week      PT Plan      Co-evaluation              AM-PAC PT "6 Clicks" Mobility   Outcome Measure  Help needed turning from your back to your side while in a flat bed without using bedrails?: Total Help needed moving from lying on your back to sitting on the side of a flat bed without using bedrails?: Total Help needed moving to and from a bed to a chair  (including a wheelchair)?: Total Help needed standing up from a chair using your arms (e.g., wheelchair or bedside chair)?: Total Help needed to walk in hospital room?: Total Help needed climbing 3-5 steps with a railing? : Total 6 Click Score: 6    End of Session   Activity Tolerance: Patient tolerated treatment well Patient left: in bed;with call bell/phone within reach;with bed alarm set Nurse Communication: Mobility status PT Visit Diagnosis: Other abnormalities of gait and mobility (R26.89);Muscle weakness (generalized) (M62.81);Difficulty in walking, not elsewhere classified (R26.2);Pain     Time: 1310-1330 PT Time Calculation (min) (ACUTE ONLY): 20 min  Charges:    $Therapeutic Activity: 8-22 mins PT General Charges $$ ACUTE PT VISIT: 1 Visit                   Danielle Dess, PTA 03/16/23, 1:39 PM

## 2023-03-16 NOTE — ED Notes (Signed)
Fluids provided. Brief dry.

## 2023-03-17 NOTE — ED Notes (Addendum)
Pt sat up to eat breakfast.  This RN warmed up pts food. Pt ate 100% of grits and applesauce cup. Pt requires assistance to eat. Pt requested to eat lunch after her nap. NAD.

## 2023-03-17 NOTE — ED Provider Notes (Signed)
-----------------------------------------   5:52 AM on 03/17/2023 -----------------------------------------   Blood pressure 134/77, pulse 76, temperature 98.1 F (36.7 C), temperature source Oral, resp. rate 14, height 5\' 6"  (1.676 m), weight 61.7 kg, SpO2 99%.  The patient is calm and cooperative at this time.  There have been no acute events since the last update.  Awaiting disposition plan from Social Work team.   Irean Hong, MD 03/17/23 [phone]

## 2023-03-17 NOTE — ED Notes (Addendum)
This nurse checked Pt's brief. Brief is clean.  Bandage on sacrum is clean and free of blemishes.Pt assisted with dinner. Pt ate half of mashed potatoes and some [place]. Pt wanted to finish eating later. Pt mouth cleansed and pt repositioned. NAD

## 2023-03-17 NOTE — ED Notes (Signed)
Pt is sleeping. Equal rise and fall of chest noted. Pt has had lack of sleep per report from previous RN. NAD.

## 2023-03-17 NOTE — ED Notes (Signed)
This RN asked if pt wanted to eat breakfast, pt declined stating she wanted to sleep more. NAD.

## 2023-03-17 NOTE — Progress Notes (Signed)
Physical Therapy Treatment Patient Details Name: Molly [name] MRN: [id number] DOB: [birth year] Today's Date: 03/17/2023   History of Present Illness Patient is a 77 year old female coming from home with recent discharged from [facility] with therapy recommendation for SNF. Spouse unable to care for patient at home. History of hyperlipidemia, depression, anxiety, CVA with left-sided residual weakness, diabetes mellitus, dementia, hypertension, GERD, decubitus ulcer.    PT Comments  Ready for sesssion.  To sitting/supine with max a x 1 .  Sitting balance limited on stretcher due to feet not reaching floor.  He is able to maintain sitting balance for short periods of time but does need min a x 1 to correct.  BLE AROM as able in sitting, stretching LUE to tolerance.   If plan is discharge home, recommend the following: Two people to help with walking and/or transfers;Two people to help with bathing/dressing/bathroom;Assist for transportation;Help with stairs or ramp for entrance;Supervision due to cognitive status;Assistance with feeding;Direct supervision/assist for medications management;Direct supervision/assist for financial management;Assistance with cooking/housework   Can travel by private vehicle        Equipment Recommendations       Recommendations for Other Services       Precautions / Restrictions Precautions Precautions: Fall Restrictions Weight Bearing Restrictions Per Provider Order: No     Mobility  Bed Mobility Overal bed mobility: Needs Assistance Bed Mobility: Supine to Sit, Sit to Supine     Supine to sit: Max assist Sit to supine: Max assist        Transfers                   General transfer comment: unsafe to attempt    Ambulation/Gait                   Stairs             Wheelchair Mobility     Tilt Bed    Modified Rankin (Stroke Patients Only)       Balance Overall balance assessment: Needs  assistance Sitting-balance support: Feet unsupported, Single extremity supported Sitting balance-Leahy Scale: Poor Sitting balance - Comments: overall improved today but limted due to stretcher and feet unable to hit floor.  able to maintain for short periods.  would do well on bed or mat table                                    Cognition Arousal: Alert Behavior During Therapy: WFL for tasks assessed/performed Overall Cognitive Status: Within Functional Limits for tasks assessed                                          Exercises      General Comments        Pertinent Vitals/Pain Pain Assessment Pain Assessment: Faces Faces Pain Scale: Hurts even more Pain Location: LUE stetching Pain Descriptors / Indicators: Grimacing, Guarding, Moaning    Home Living                          Prior Function            PT Goals (current goals can now be found in the care plan section) Progress towards PT goals: Progressing toward goals    Frequency  Min 1X/week      PT Plan      Co-evaluation              AM-PAC PT "6 Clicks" Mobility   Outcome Measure  Help needed turning from your back to your side while in a flat bed without using bedrails?: Total Help needed moving from lying on your back to sitting on the side of a flat bed without using bedrails?: Total Help needed moving to and from a bed to a chair (including a wheelchair)?: Total Help needed standing up from a chair using your arms (e.g., wheelchair or bedside chair)?: Total Help needed to walk in hospital room?: Total Help needed climbing 3-5 steps with a railing? : Total 6 Click Score: 6    End of Session   Activity Tolerance: Patient tolerated treatment well Patient left: in bed;with call bell/phone within reach;with bed alarm set Nurse Communication: Mobility status PT Visit Diagnosis: Other abnormalities of gait and mobility (R26.89);Muscle weakness  (generalized) (M62.81);Difficulty in walking, not elsewhere classified (R26.2);Pain     Time: 1117-1130 PT Time Calculation (min) (ACUTE ONLY): 13 min  Charges:    $Therapeutic Activity: 8-22 mins PT General Charges $$ ACUTE PT VISIT: 1 Visit                   Danielle Dess, PTA 03/17/23, 11:33 AM

## 2023-03-17 NOTE — ED Notes (Signed)
This RN set up pt to eat after her nap. Pt ate 100% spaghetti, 20% of broccoli, one whole cookie and one cup of iced tea.  Pt mouth cleansed and pt repositioned. NAD. Meal tray thrown away

## 2023-03-17 NOTE — TOC Progression Note (Signed)
Transition of Care [facility]) - Progression Note    Patient Details  Name: Molly [name] MRN: [id number] Date of Birth: 02-13-1946  Transition [facility]) CM/SW Contact  Susa Simmonds, [place] Phone Number: 03/17/2023, 6:27 PM  Clinical Narrative:   CSW contacted [employer] and was told patients insurance Berkley Harvey is still under review. CSW was told no additional notes are being requested or peer to peer at this time.          Expected Discharge Plan and Services                                               Social Determinants of Health (SDOH) Interventions SDOH Screenings   Food Insecurity: Patient Unable To Answer (03/09/2023)  Housing: Patient Unable To Answer (03/09/2023)  Transportation Needs: Patient Unable To Answer (03/09/2023)  Utilities: Patient Unable To Answer (03/09/2023)  Tobacco Use: Medium Risk (03/12/2023)    Readmission Risk Interventions    03/08/2023    4:53 PM  Readmission Risk Prevention Plan  Post Dischage Appt Complete  Medication Screening Complete

## 2023-03-18 NOTE — ED Notes (Signed)
Pt ate 25% of meal. With assistance

## 2023-03-18 NOTE — ED Notes (Signed)
Changed pt's brief. Incontinent of stool and urine. Mepilex inplace.

## 2023-03-18 NOTE — ED Notes (Signed)
Set up breakfast tray for

## 2023-03-18 NOTE — ED Notes (Signed)
Assisting pt with dinner tray.

## 2023-03-18 NOTE — ED Provider Notes (Signed)
Emergency Medicine Observation Re-evaluation Note  Physical Exam   BP 110/64 (BP Location: Right Arm)   Pulse 69   Temp 98.1 F (36.7 C) (Oral)   Resp 14   Ht 5\' 6"  (1.676 m)   Wt 61.7 kg   SpO2 97%   BMI 21.95 kg/m   Patient appears in no acute distress.  ED Course / MDM   No reported events during my shift at the time of this note.   Pt is awaiting dispo from consultants   Pilar Jarvis MD    Pilar Jarvis, MD 03/18/23 [phone]

## 2023-03-18 NOTE — TOC Progression Note (Signed)
Transition of Care [facility]) - Progression Note    Patient Details  Name: Molly [name] MRN: [id number] Date of Birth: 31-Jan-1946  [facility]) CM/SW Contact  Marquita Palms, LCSW Phone Number: 03/18/2023, 11:59 AM  Clinical Narrative:     CSW spoke with insurance who is reviewing information for patient to attend SNF for STR.Marland Kitchen Patient has not been able to get up from her bed or do any transfers. Nurse from [facility] reports they will probably ask for a peer to peer. CSW spoke with patients husband who is requesting possible Hospice. CSW secure message Ree Kida with [facility] to give the family a call. Awaiting call back from [place].       Expected Discharge Plan and Services                                               Social Determinants of Health (SDOH) Interventions SDOH Screenings   Food Insecurity: Patient Unable To Answer (03/09/2023)  Housing: Patient Unable To Answer (03/09/2023)  Transportation Needs: Patient Unable To Answer (03/09/2023)  Utilities: Patient Unable To Answer (03/09/2023)  Tobacco Use: Medium Risk (03/12/2023)    Readmission Risk Interventions    03/08/2023    4:53 PM  Readmission Risk Prevention Plan  Post Dischage Appt Complete  Medication Screening Complete

## 2023-03-18 NOTE — Progress Notes (Addendum)
[facility]- Civil engineer, contracting  Received a referral from [facility], Carmelia Bake, LCSW to speak with patient's spouse about Hospice services.  Spoke with Shantrel Capellan today about hospice services as they relate to patients living at home and in a LTC facility.  Spouse  verbalized understanding of information given. He would like for Hospice services to follow patient at a LTC facility once a bed has been found.  TOC and hospital team aware.  Referral submitted today.    Please don't hesitate to call with any Hospice related questions or concerns.    Thank you for the opportunity to participate in this patient's care. [facility] Liaison [phone]

## 2023-03-19 NOTE — ED Provider Notes (Signed)
Emergency Medicine Observation Re-evaluation Note  Physical Exam   BP (!) 149/97   Pulse 81   Temp 98 F (36.7 C) (Oral)   Resp 14   Ht 5\' 6"  (1.676 m)   Wt 61.7 kg   SpO2 99%   BMI 21.95 kg/m   Patient appears in no acute distress.  ED Course / MDM   No reported events during my shift at the time of this note.   Pt is awaiting dispo from consultants   Pilar Jarvis MD    Pilar Jarvis, MD 03/19/23 [phone]

## 2023-03-19 NOTE — TOC Progression Note (Signed)
Transition of Care [facility]) - Progression Note    Patient Details  Name: Molly [name] MRN: [id number] Date of Birth: 1945-11-12  Transition of Care [facility]) CM/SW Contact  Marquita Palms, LCSW Phone Number: 03/19/2023, 12:31 PM  Clinical Narrative:     CSW met with MD for peer to peer with [employer]. MD was told patient needs to be LTAC and not SNF with physical therapy and occupational therapy. CSW called husband of patient Mr. Ronne Binning at [phone]. CSW met with patient bedside to just say "hello." Patient said "hello" and was being fed by nurse. Patient reports she has not been up to walk, "but it would be wonderful if I did." CSW awaiting information from [facility] with LTAC and husband. Patient accepted by Kenney Houseman with [employer].       Expected Discharge Plan and Services                                               Social Determinants of Health (SDOH) Interventions SDOH Screenings   Food Insecurity: Patient Unable To Answer (03/09/2023)  Housing: Patient Unable To Answer (03/09/2023)  Transportation Needs: Patient Unable To Answer (03/09/2023)  Utilities: Patient Unable To Answer (03/09/2023)  Tobacco Use: Medium Risk (03/12/2023)    Readmission Risk Interventions    03/08/2023    4:53 PM  Readmission Risk Prevention Plan  Post Dischage Appt Complete  Medication Screening Complete

## 2023-03-19 NOTE — ED Provider Notes (Signed)
I was asked to discuss SNF placement with insurer as a Peer-To-Peer. Insurance states patient does not meet SNF criteria but instead LTACH as her wound does not require advanced care and she is "near her baseline."  I reiterated that physical therapy and Occupational Therapy as well as multiple previous physicians have explicitly stated the patient would benefit from skilled nursing care, and acute rehab.  Insurance was focused on the fact that her wound does not need advanced care, but I reiterated that patient has had an acute superinfection of the wound so regardless of care, she is recovering from an acute infection that mandated hospital admission.  She remains significantly below her baseline level of function.  He again reiterated that she is bedbound. I reiterated that patient is typically able to feed herself and perform multiple other tasks on her own, which she cannot do now.  I reiterated that patient's bedbound status should not determine whether or not she can go to a skilled nursing facility especially when this has been recommended by physical and Occupational Therapy.  Case management to discuss further, as well as review available options.   Shaune Pollack, MD 03/19/23 [phone]

## 2023-03-19 NOTE — Progress Notes (Signed)
Physical Therapy Treatment Patient Details Name: Molly [name] MRN: [id number] DOB: 1945-11-23 Today's Date: 03/19/2023   History of Present Illness Patient is a 77 year old female coming from home with recent discharged from [facility] with therapy recommendation for SNF. Spouse unable to care for patient at home. History of hyperlipidemia, depression, anxiety, CVA with left-sided residual weakness, diabetes mellitus, dementia, hypertension, GERD, decubitus ulcer.    PT Comments  Patient supine in bed on arrival. Required maxA to come to EOB and up to maxA to maintain sitting balance. Attempted to stand, however patient resistant to assist and stating "no". Returned to supine with totalA. Declined further attempts at mobility. Discharge plan remains appropriate.     If plan is discharge home, recommend the following: Two people to help with walking and/or transfers;Two people to help with bathing/dressing/bathroom;Assist for transportation;Help with stairs or ramp for entrance;Supervision due to cognitive status;Assistance with feeding;Direct supervision/assist for medications management;Direct supervision/assist for financial management;Assistance with cooking/housework   Can travel by private vehicle     No  Equipment Recommendations       Recommendations for Other Services       Precautions / Restrictions Precautions Precautions: Fall Restrictions Weight Bearing Restrictions Per Provider Order: No     Mobility  Bed Mobility Overal bed mobility: Needs Assistance Bed Mobility: Supine to Sit, Sit to Supine     Supine to sit: Max assist Sit to supine: Total assist        Transfers                   General transfer comment: attempted to stand, however patient resistant to attempt and stating "no"    Ambulation/Gait                   Stairs             Wheelchair Mobility     Tilt Bed    Modified Rankin (Stroke Patients Only)        Balance Overall balance assessment: Needs assistance Sitting-balance support: Feet unsupported, Single extremity supported Sitting balance-Leahy Scale: Poor                                      Cognition Arousal: Alert Behavior During Therapy: WFL for tasks assessed/performed Overall Cognitive Status: History of cognitive impairments - at baseline                                          Exercises      General Comments        Pertinent Vitals/Pain Pain Assessment Pain Assessment: Faces Faces Pain Scale: Hurts even more Pain Location: buttocks Pain Descriptors / Indicators: Grimacing, Guarding, Moaning Pain Intervention(s): Monitored during session, Limited activity within patient's tolerance, Repositioned    Home Living                          Prior Function            PT Goals (current goals can now be found in the care plan section) Acute Rehab PT Goals PT Goal Formulation: Patient unable to participate in goal setting Time For Goal Achievement: 03/28/23 Potential to Achieve Goals: Fair Progress towards PT goals: Progressing toward goals    Frequency  Min 1X/week      PT Plan      Co-evaluation              AM-PAC PT "6 Clicks" Mobility   Outcome Measure  Help needed turning from your back to your side while in a flat bed without using bedrails?: Total Help needed moving from lying on your back to sitting on the side of a flat bed without using bedrails?: Total Help needed moving to and from a bed to a chair (including a wheelchair)?: Total Help needed standing up from a chair using your arms (e.g., wheelchair or bedside chair)?: Total Help needed to walk in hospital room?: Total Help needed climbing 3-5 steps with a railing? : Total 6 Click Score: 6    End of Session   Activity Tolerance: Patient tolerated treatment well Patient left: in bed;with call bell/phone within reach Nurse  Communication: Mobility status PT Visit Diagnosis: Other abnormalities of gait and mobility (R26.89);Muscle weakness (generalized) (M62.81);Difficulty in walking, not elsewhere classified (R26.2);Pain     Time: 8119-1478 PT Time Calculation (min) (ACUTE ONLY): 10 min  Charges:    $Therapeutic Activity: 8-22 mins PT General Charges $$ ACUTE PT VISIT: 1 Visit                     Maylon Peppers, PT, DPT Physical Therapist - [facility]  [facility]    Iria Jamerson A Shaleka Brines 03/19/2023, 3:41 PM

## 2023-03-20 NOTE — ED Notes (Signed)
Pt cleaned of incontinence. Bed linens changed.

## 2023-03-20 NOTE — ED Notes (Signed)
 Pt is resting in bed with eyes closed. Respirations are even and unlabored.

## 2023-03-21 NOTE — ED Notes (Signed)
Pt fed dinner meal at this time. Pt fed all three meals. Breakfast by Farley Ly, lunch by Rushie Goltz NT, and dinner by this RN.

## 2023-03-21 NOTE — ED Notes (Signed)
Spoke with Child psychotherapist about patient going to [employer] health care this weekend  shannon sw was going to call family

## 2023-03-21 NOTE — Progress Notes (Signed)
[facility] Liaison Note  Follow up on new referral for home hospice.  Patient awaiting LTC placement.   Hospital Liaison Team will follow through peripherally through final disposition.  Thank you for allowing participation in this patient's care.  Norris Cross, RN Nurse Liaison [phone]

## 2023-03-21 NOTE — ED Notes (Signed)
Pt changed at this time. New brief applied. New chux placed. New foam pad on sacrum placed. Noted that pt has a pressure wound on sacrum. Pt repositioned on side.

## 2023-03-21 NOTE — ED Notes (Signed)
Patient ate approx 75% of meal and drank 3 orange juices.

## 2023-03-22 LAB — CBG MONITORING, ED: Glucose-Capillary: 79 mg/dL (ref 70–99)

## 2023-03-22 NOTE — ED Notes (Signed)
[facility] Called for transport to residence

## 2023-03-22 NOTE — ED Provider Notes (Signed)
-----------------------------------------   11:46 AM on 03/22/2023 ----------------------------------------- Husband spoke to Child psychotherapist.  They have arranged for the patient to go home with hospice care.  He would like the patient discharged back to her residence via EMS.  Patient's medical workup is overall reassuring with no significant findings.  We will arrange for discharge home via EMS.   Minna Antis, MD 03/22/23 1146

## 2023-03-22 NOTE — ED Provider Notes (Signed)
-----------------------------------------   2:46 AM on 03/22/2023 -----------------------------------------   Blood pressure (!) 116/94, pulse 63, temperature 98.2 F (36.8 C), temperature source Oral, resp. rate 17, height 5\' 6"  (1.676 m), weight 61.7 kg, SpO2 99%.  The patient is calm and cooperative at this time.  There have been no acute events since the last update.  Awaiting disposition plan from case management/social work.    Malky Rudzinski, Layla Maw, DO 03/22/23 [phone]

## 2023-03-22 NOTE — ED Notes (Addendum)
Incontinence care provided +urine and small amount of stool. Pt given warm blankets and new linens/gown for comfort. Mepilex placed on sacral wound.

## 2023-03-22 NOTE — TOC Transition Note (Signed)
Transition of Care [facility]) - Discharge Note   Patient Details  Name: Molly [name] MRN: [id number] Date of Birth: Sep 06, 1945  [facility] CM/SW Contact:  Margarito Liner, LCSW Phone Number: 03/22/2023, 12:03 PM   Clinical Narrative:   Per MD, okay to discharge home hospice today. Nurse secretary will arrange EMS transport home. CSW asked her to call husband once arranged. No further concerns. CSW signing off.  Final next level of care: Home w Hospice Care Barriers to Discharge: Barriers Resolved   Patient Goals and CMS Choice            Discharge Placement                Patient to be transferred to facility by: EMS Name of family member notified: Antony Salmon Patient and family notified of of transfer: 03/22/23  Discharge Plan and Services Additional resources added to the After Visit Summary for                                       Social Drivers of Health (SDOH) Interventions SDOH Screenings   Food Insecurity: Patient Unable To Answer (03/09/2023)  Housing: Patient Unable To Answer (03/09/2023)  Transportation Needs: Patient Unable To Answer (03/09/2023)  Utilities: Patient Unable To Answer (03/09/2023)  Tobacco Use: Medium Risk (03/12/2023)     Readmission Risk Interventions    03/08/2023    4:53 PM  Readmission Risk Prevention Plan  Post Dischage Appt Complete  Medication Screening Complete

## 2023-03-22 NOTE — ED Notes (Signed)
RN called  to verify spouse is home. EMS to ED to take pt back home. Spouse is home at this time and is expecting pt. Pt alert and oriented to her baseline at time of discharge. Pt provided clean gown and peri care prior to d/c. Vital signs wnl at this time.

## 2023-03-22 NOTE — TOC Progression Note (Signed)
Transition of Care [facility]) - Progression Note    Patient Details  Name: Molly [name] MRN: [id number] Date of Birth: 1946-03-30  Transition of Care [facility]) CM/SW Contact  Margarito Liner, LCSW Phone Number: 03/22/2023, 11:40 AM  Clinical Narrative:  No supports at bedside. CSW called husband who confirmed her wants to bring her home with hospice. He has discussed this with the [facility] liaison. No DME needs. Patient will need EMS transport home. Address on the facesheet is correct. Husband is aware they might receive a bill for transport. Husband stated he would be home all day to receive her.  Expected Discharge Plan and Services                                               Social Determinants of Health (SDOH) Interventions SDOH Screenings   Food Insecurity: Patient Unable To Answer (03/09/2023)  Housing: Patient Unable To Answer (03/09/2023)  Transportation Needs: Patient Unable To Answer (03/09/2023)  Utilities: Patient Unable To Answer (03/09/2023)  Tobacco Use: Medium Risk (03/12/2023)    Readmission Risk Interventions    03/08/2023    4:53 PM  Readmission Risk Prevention Plan  Post Dischage Appt Complete  Medication Screening Complete

## 2023-03-22 NOTE — ED Notes (Signed)
Patient fed breakfast tray by Beola Cord, NT. Patient ate 100% of tray.

## 2023-08-29 DEATH — deceased
